# Patient Record
Sex: Female | Born: 1945
Health system: Southern US, Community
[De-identification: ages and names within clinical notes are randomized; demographics above are authoritative.]

## PROBLEM LIST (undated history)

## (undated) ENCOUNTER — Emergency Department (HOSPITAL_COMMUNITY): Discharge status: Absent without leave | Payer: No Typology Code available for payment source

## (undated) DIAGNOSIS — I639 Cerebral infarction, unspecified: Secondary | ICD-10-CM

## (undated) DIAGNOSIS — I10 Essential (primary) hypertension: Secondary | ICD-10-CM

## (undated) DIAGNOSIS — E119 Type 2 diabetes mellitus without complications: Secondary | ICD-10-CM

## (undated) DIAGNOSIS — R569 Unspecified convulsions: Secondary | ICD-10-CM

## (undated) DIAGNOSIS — I4891 Unspecified atrial fibrillation: Secondary | ICD-10-CM

## (undated) DIAGNOSIS — E7800 Pure hypercholesterolemia, unspecified: Secondary | ICD-10-CM

## (undated) HISTORY — DX: Unspecified atrial fibrillation: I48.91

## (undated) HISTORY — DX: Cerebral infarction, unspecified: I63.9

## (undated) HISTORY — PX: TUBAL LIGATION: SHX77

## (undated) HISTORY — PX: ABDOMINAL HYSTERECTOMY: SHX81

---

## 2000-12-16 ENCOUNTER — Encounter: Payer: Self-pay | Admitting: Specialist

## 2000-12-16 ENCOUNTER — Ambulatory Visit (HOSPITAL_COMMUNITY): Admission: RE | Admit: 2000-12-16 | Discharge: 2000-12-16 | Payer: Self-pay | Admitting: *Deleted

## 2001-05-10 ENCOUNTER — Encounter: Payer: Self-pay | Admitting: Family Medicine

## 2001-05-10 ENCOUNTER — Ambulatory Visit (HOSPITAL_COMMUNITY): Admission: RE | Admit: 2001-05-10 | Discharge: 2001-05-10 | Payer: Self-pay | Admitting: Family Medicine

## 2001-12-22 ENCOUNTER — Encounter: Payer: Self-pay | Admitting: Specialist

## 2001-12-22 ENCOUNTER — Ambulatory Visit (HOSPITAL_COMMUNITY): Admission: RE | Admit: 2001-12-22 | Discharge: 2001-12-22 | Payer: Self-pay | Admitting: Specialist

## 2003-02-15 ENCOUNTER — Ambulatory Visit (HOSPITAL_COMMUNITY): Admission: RE | Admit: 2003-02-15 | Discharge: 2003-02-15 | Payer: Self-pay | Admitting: Specialist

## 2008-02-18 ENCOUNTER — Ambulatory Visit (HOSPITAL_COMMUNITY): Admission: RE | Admit: 2008-02-18 | Discharge: 2008-02-18 | Payer: Self-pay | Admitting: Internal Medicine

## 2010-06-17 ENCOUNTER — Other Ambulatory Visit (HOSPITAL_COMMUNITY): Payer: Self-pay | Admitting: Internal Medicine

## 2010-06-17 DIAGNOSIS — Z139 Encounter for screening, unspecified: Secondary | ICD-10-CM

## 2010-06-18 ENCOUNTER — Ambulatory Visit (HOSPITAL_COMMUNITY)
Admission: RE | Admit: 2010-06-18 | Discharge: 2010-06-18 | Disposition: A | Discharge status: Home / Self Care | Payer: 59 | Source: Ambulatory Visit | Attending: Internal Medicine | Admitting: Internal Medicine

## 2010-06-18 DIAGNOSIS — Z1231 Encounter for screening mammogram for malignant neoplasm of breast: Secondary | ICD-10-CM | POA: Insufficient documentation

## 2010-06-18 DIAGNOSIS — Z139 Encounter for screening, unspecified: Secondary | ICD-10-CM

## 2013-02-22 ENCOUNTER — Other Ambulatory Visit (HOSPITAL_COMMUNITY): Payer: Self-pay | Admitting: Internal Medicine

## 2013-02-22 DIAGNOSIS — Z139 Encounter for screening, unspecified: Secondary | ICD-10-CM

## 2013-02-28 ENCOUNTER — Ambulatory Visit (HOSPITAL_COMMUNITY)
Admission: RE | Admit: 2013-02-28 | Discharge: 2013-02-28 | Disposition: A | Discharge status: Home / Self Care | Payer: 59 | Source: Ambulatory Visit | Attending: Internal Medicine | Admitting: Internal Medicine

## 2013-02-28 DIAGNOSIS — Z780 Asymptomatic menopausal state: Secondary | ICD-10-CM | POA: Insufficient documentation

## 2013-02-28 DIAGNOSIS — Z139 Encounter for screening, unspecified: Secondary | ICD-10-CM

## 2013-02-28 DIAGNOSIS — Z13820 Encounter for screening for osteoporosis: Secondary | ICD-10-CM | POA: Insufficient documentation

## 2013-05-24 DIAGNOSIS — R7309 Other abnormal glucose: Secondary | ICD-10-CM | POA: Diagnosis not present

## 2013-05-24 DIAGNOSIS — I10 Essential (primary) hypertension: Secondary | ICD-10-CM | POA: Diagnosis not present

## 2013-05-26 DIAGNOSIS — I10 Essential (primary) hypertension: Secondary | ICD-10-CM | POA: Diagnosis not present

## 2013-05-26 DIAGNOSIS — R7309 Other abnormal glucose: Secondary | ICD-10-CM | POA: Diagnosis not present

## 2013-05-26 DIAGNOSIS — J309 Allergic rhinitis, unspecified: Secondary | ICD-10-CM | POA: Diagnosis not present

## 2013-05-26 DIAGNOSIS — Z0000 Encounter for general adult medical examination without abnormal findings: Secondary | ICD-10-CM | POA: Diagnosis not present

## 2013-06-27 ENCOUNTER — Other Ambulatory Visit (HOSPITAL_COMMUNITY): Payer: Self-pay | Admitting: Internal Medicine

## 2013-06-27 DIAGNOSIS — Z1231 Encounter for screening mammogram for malignant neoplasm of breast: Secondary | ICD-10-CM

## 2013-06-30 ENCOUNTER — Ambulatory Visit (HOSPITAL_COMMUNITY)
Admission: RE | Admit: 2013-06-30 | Discharge: 2013-06-30 | Disposition: A | Discharge status: Home / Self Care | Payer: Medicare Other | Source: Ambulatory Visit | Attending: Internal Medicine | Admitting: Internal Medicine

## 2013-06-30 DIAGNOSIS — Z1231 Encounter for screening mammogram for malignant neoplasm of breast: Secondary | ICD-10-CM | POA: Diagnosis not present

## 2013-11-25 DIAGNOSIS — I10 Essential (primary) hypertension: Secondary | ICD-10-CM | POA: Diagnosis not present

## 2013-11-25 DIAGNOSIS — R7309 Other abnormal glucose: Secondary | ICD-10-CM | POA: Diagnosis not present

## 2013-11-29 DIAGNOSIS — I10 Essential (primary) hypertension: Secondary | ICD-10-CM | POA: Diagnosis not present

## 2013-11-29 DIAGNOSIS — R635 Abnormal weight gain: Secondary | ICD-10-CM | POA: Diagnosis not present

## 2013-11-29 DIAGNOSIS — Z23 Encounter for immunization: Secondary | ICD-10-CM | POA: Diagnosis not present

## 2013-11-29 DIAGNOSIS — R7309 Other abnormal glucose: Secondary | ICD-10-CM | POA: Diagnosis not present

## 2014-05-16 DIAGNOSIS — R7301 Impaired fasting glucose: Secondary | ICD-10-CM | POA: Diagnosis not present

## 2014-05-16 DIAGNOSIS — I10 Essential (primary) hypertension: Secondary | ICD-10-CM | POA: Diagnosis not present

## 2014-05-18 DIAGNOSIS — R7301 Impaired fasting glucose: Secondary | ICD-10-CM | POA: Diagnosis not present

## 2014-05-18 DIAGNOSIS — E782 Mixed hyperlipidemia: Secondary | ICD-10-CM | POA: Diagnosis not present

## 2014-05-18 DIAGNOSIS — I10 Essential (primary) hypertension: Secondary | ICD-10-CM | POA: Diagnosis not present

## 2014-05-18 DIAGNOSIS — Z6825 Body mass index (BMI) 25.0-25.9, adult: Secondary | ICD-10-CM | POA: Diagnosis not present

## 2014-06-02 ENCOUNTER — Other Ambulatory Visit (HOSPITAL_COMMUNITY): Payer: Self-pay | Admitting: Internal Medicine

## 2014-06-02 DIAGNOSIS — Z1231 Encounter for screening mammogram for malignant neoplasm of breast: Secondary | ICD-10-CM

## 2014-07-05 ENCOUNTER — Ambulatory Visit (HOSPITAL_COMMUNITY)
Admission: RE | Admit: 2014-07-05 | Discharge: 2014-07-05 | Disposition: A | Discharge status: Home / Self Care | Payer: Medicare Other | Source: Ambulatory Visit | Attending: Internal Medicine | Admitting: Internal Medicine

## 2014-07-05 DIAGNOSIS — Z1231 Encounter for screening mammogram for malignant neoplasm of breast: Secondary | ICD-10-CM | POA: Insufficient documentation

## 2014-11-22 DIAGNOSIS — R7301 Impaired fasting glucose: Secondary | ICD-10-CM | POA: Diagnosis not present

## 2014-11-22 DIAGNOSIS — I10 Essential (primary) hypertension: Secondary | ICD-10-CM | POA: Diagnosis not present

## 2014-11-22 DIAGNOSIS — E782 Mixed hyperlipidemia: Secondary | ICD-10-CM | POA: Diagnosis not present

## 2014-11-29 DIAGNOSIS — Z23 Encounter for immunization: Secondary | ICD-10-CM | POA: Diagnosis not present

## 2014-11-29 DIAGNOSIS — I10 Essential (primary) hypertension: Secondary | ICD-10-CM | POA: Diagnosis not present

## 2015-05-25 DIAGNOSIS — E782 Mixed hyperlipidemia: Secondary | ICD-10-CM | POA: Diagnosis not present

## 2015-05-25 DIAGNOSIS — I10 Essential (primary) hypertension: Secondary | ICD-10-CM | POA: Diagnosis not present

## 2015-05-25 DIAGNOSIS — R7301 Impaired fasting glucose: Secondary | ICD-10-CM | POA: Diagnosis not present

## 2015-05-29 DIAGNOSIS — I10 Essential (primary) hypertension: Secondary | ICD-10-CM | POA: Diagnosis not present

## 2015-05-29 DIAGNOSIS — E782 Mixed hyperlipidemia: Secondary | ICD-10-CM | POA: Diagnosis not present

## 2015-05-29 DIAGNOSIS — R7301 Impaired fasting glucose: Secondary | ICD-10-CM | POA: Diagnosis not present

## 2015-07-18 ENCOUNTER — Other Ambulatory Visit (HOSPITAL_COMMUNITY): Payer: Self-pay | Admitting: Internal Medicine

## 2015-07-18 DIAGNOSIS — Z1231 Encounter for screening mammogram for malignant neoplasm of breast: Secondary | ICD-10-CM

## 2015-07-20 ENCOUNTER — Ambulatory Visit (HOSPITAL_COMMUNITY)
Admission: RE | Admit: 2015-07-20 | Discharge: 2015-07-20 | Disposition: A | Discharge status: Home / Self Care | Payer: Medicare Other | Source: Ambulatory Visit | Attending: Internal Medicine | Admitting: Internal Medicine

## 2015-07-20 DIAGNOSIS — Z1231 Encounter for screening mammogram for malignant neoplasm of breast: Secondary | ICD-10-CM | POA: Insufficient documentation

## 2015-09-17 DIAGNOSIS — R7301 Impaired fasting glucose: Secondary | ICD-10-CM | POA: Diagnosis not present

## 2015-09-17 DIAGNOSIS — E782 Mixed hyperlipidemia: Secondary | ICD-10-CM | POA: Diagnosis not present

## 2015-09-28 DIAGNOSIS — E782 Mixed hyperlipidemia: Secondary | ICD-10-CM | POA: Diagnosis not present

## 2015-09-28 DIAGNOSIS — I10 Essential (primary) hypertension: Secondary | ICD-10-CM | POA: Diagnosis not present

## 2015-09-28 DIAGNOSIS — E119 Type 2 diabetes mellitus without complications: Secondary | ICD-10-CM | POA: Diagnosis not present

## 2015-11-16 ENCOUNTER — Other Ambulatory Visit: Payer: Self-pay

## 2015-12-20 DIAGNOSIS — Z23 Encounter for immunization: Secondary | ICD-10-CM | POA: Diagnosis not present

## 2016-03-13 DIAGNOSIS — M25511 Pain in right shoulder: Secondary | ICD-10-CM | POA: Diagnosis not present

## 2016-03-26 DIAGNOSIS — M25511 Pain in right shoulder: Secondary | ICD-10-CM | POA: Diagnosis not present

## 2016-03-27 DIAGNOSIS — M6281 Muscle weakness (generalized): Secondary | ICD-10-CM | POA: Diagnosis not present

## 2016-03-27 DIAGNOSIS — M25611 Stiffness of right shoulder, not elsewhere classified: Secondary | ICD-10-CM | POA: Diagnosis not present

## 2016-03-27 DIAGNOSIS — M25511 Pain in right shoulder: Secondary | ICD-10-CM | POA: Diagnosis not present

## 2016-03-27 DIAGNOSIS — M7551 Bursitis of right shoulder: Secondary | ICD-10-CM | POA: Diagnosis not present

## 2016-03-31 DIAGNOSIS — M25611 Stiffness of right shoulder, not elsewhere classified: Secondary | ICD-10-CM | POA: Diagnosis not present

## 2016-03-31 DIAGNOSIS — M7551 Bursitis of right shoulder: Secondary | ICD-10-CM | POA: Diagnosis not present

## 2016-03-31 DIAGNOSIS — M25511 Pain in right shoulder: Secondary | ICD-10-CM | POA: Diagnosis not present

## 2016-03-31 DIAGNOSIS — M6281 Muscle weakness (generalized): Secondary | ICD-10-CM | POA: Diagnosis not present

## 2016-04-07 DIAGNOSIS — M25611 Stiffness of right shoulder, not elsewhere classified: Secondary | ICD-10-CM | POA: Diagnosis not present

## 2016-04-07 DIAGNOSIS — M25511 Pain in right shoulder: Secondary | ICD-10-CM | POA: Diagnosis not present

## 2016-04-07 DIAGNOSIS — M6281 Muscle weakness (generalized): Secondary | ICD-10-CM | POA: Diagnosis not present

## 2016-04-07 DIAGNOSIS — M7551 Bursitis of right shoulder: Secondary | ICD-10-CM | POA: Diagnosis not present

## 2016-04-09 DIAGNOSIS — M6281 Muscle weakness (generalized): Secondary | ICD-10-CM | POA: Diagnosis not present

## 2016-04-09 DIAGNOSIS — M25511 Pain in right shoulder: Secondary | ICD-10-CM | POA: Diagnosis not present

## 2016-04-09 DIAGNOSIS — M7551 Bursitis of right shoulder: Secondary | ICD-10-CM | POA: Diagnosis not present

## 2016-04-09 DIAGNOSIS — M25611 Stiffness of right shoulder, not elsewhere classified: Secondary | ICD-10-CM | POA: Diagnosis not present

## 2016-04-14 DIAGNOSIS — M25511 Pain in right shoulder: Secondary | ICD-10-CM | POA: Diagnosis not present

## 2016-04-14 DIAGNOSIS — M7551 Bursitis of right shoulder: Secondary | ICD-10-CM | POA: Diagnosis not present

## 2016-04-14 DIAGNOSIS — M25611 Stiffness of right shoulder, not elsewhere classified: Secondary | ICD-10-CM | POA: Diagnosis not present

## 2016-04-14 DIAGNOSIS — M6281 Muscle weakness (generalized): Secondary | ICD-10-CM | POA: Diagnosis not present

## 2016-04-16 DIAGNOSIS — M25611 Stiffness of right shoulder, not elsewhere classified: Secondary | ICD-10-CM | POA: Diagnosis not present

## 2016-04-16 DIAGNOSIS — M6281 Muscle weakness (generalized): Secondary | ICD-10-CM | POA: Diagnosis not present

## 2016-04-16 DIAGNOSIS — M25511 Pain in right shoulder: Secondary | ICD-10-CM | POA: Diagnosis not present

## 2016-04-16 DIAGNOSIS — M7551 Bursitis of right shoulder: Secondary | ICD-10-CM | POA: Diagnosis not present

## 2016-04-21 DIAGNOSIS — M25611 Stiffness of right shoulder, not elsewhere classified: Secondary | ICD-10-CM | POA: Diagnosis not present

## 2016-04-21 DIAGNOSIS — M6281 Muscle weakness (generalized): Secondary | ICD-10-CM | POA: Diagnosis not present

## 2016-04-21 DIAGNOSIS — M25511 Pain in right shoulder: Secondary | ICD-10-CM | POA: Diagnosis not present

## 2016-04-21 DIAGNOSIS — M7551 Bursitis of right shoulder: Secondary | ICD-10-CM | POA: Diagnosis not present

## 2016-04-23 DIAGNOSIS — M25511 Pain in right shoulder: Secondary | ICD-10-CM | POA: Diagnosis not present

## 2016-05-14 DIAGNOSIS — E119 Type 2 diabetes mellitus without complications: Secondary | ICD-10-CM | POA: Diagnosis not present

## 2016-05-14 DIAGNOSIS — E782 Mixed hyperlipidemia: Secondary | ICD-10-CM | POA: Diagnosis not present

## 2016-05-16 DIAGNOSIS — Z6826 Body mass index (BMI) 26.0-26.9, adult: Secondary | ICD-10-CM | POA: Diagnosis not present

## 2016-05-16 DIAGNOSIS — I10 Essential (primary) hypertension: Secondary | ICD-10-CM | POA: Diagnosis not present

## 2016-05-16 DIAGNOSIS — E119 Type 2 diabetes mellitus without complications: Secondary | ICD-10-CM | POA: Diagnosis not present

## 2016-05-16 DIAGNOSIS — E782 Mixed hyperlipidemia: Secondary | ICD-10-CM | POA: Diagnosis not present

## 2016-06-18 ENCOUNTER — Other Ambulatory Visit (HOSPITAL_COMMUNITY): Payer: Self-pay | Admitting: Internal Medicine

## 2016-06-18 DIAGNOSIS — Z1231 Encounter for screening mammogram for malignant neoplasm of breast: Secondary | ICD-10-CM

## 2016-07-21 ENCOUNTER — Ambulatory Visit (HOSPITAL_COMMUNITY)
Admission: RE | Admit: 2016-07-21 | Discharge: 2016-07-21 | Disposition: A | Discharge status: Home / Self Care | Payer: Medicare Other | Source: Ambulatory Visit | Attending: Internal Medicine | Admitting: Internal Medicine

## 2016-07-21 DIAGNOSIS — Z1231 Encounter for screening mammogram for malignant neoplasm of breast: Secondary | ICD-10-CM | POA: Diagnosis not present

## 2016-11-27 DIAGNOSIS — I10 Essential (primary) hypertension: Secondary | ICD-10-CM | POA: Diagnosis not present

## 2016-11-27 DIAGNOSIS — E119 Type 2 diabetes mellitus without complications: Secondary | ICD-10-CM | POA: Diagnosis not present

## 2016-12-01 DIAGNOSIS — N952 Postmenopausal atrophic vaginitis: Secondary | ICD-10-CM | POA: Diagnosis not present

## 2016-12-01 DIAGNOSIS — R42 Dizziness and giddiness: Secondary | ICD-10-CM | POA: Diagnosis not present

## 2016-12-01 DIAGNOSIS — I10 Essential (primary) hypertension: Secondary | ICD-10-CM | POA: Diagnosis not present

## 2016-12-01 DIAGNOSIS — E782 Mixed hyperlipidemia: Secondary | ICD-10-CM | POA: Diagnosis not present

## 2016-12-01 DIAGNOSIS — Z23 Encounter for immunization: Secondary | ICD-10-CM | POA: Diagnosis not present

## 2016-12-01 DIAGNOSIS — E119 Type 2 diabetes mellitus without complications: Secondary | ICD-10-CM | POA: Diagnosis not present

## 2016-12-01 DIAGNOSIS — R197 Diarrhea, unspecified: Secondary | ICD-10-CM | POA: Diagnosis not present

## 2016-12-01 DIAGNOSIS — Z6826 Body mass index (BMI) 26.0-26.9, adult: Secondary | ICD-10-CM | POA: Diagnosis not present

## 2017-05-21 DIAGNOSIS — I10 Essential (primary) hypertension: Secondary | ICD-10-CM | POA: Diagnosis not present

## 2017-05-21 DIAGNOSIS — E119 Type 2 diabetes mellitus without complications: Secondary | ICD-10-CM | POA: Diagnosis not present

## 2017-05-21 DIAGNOSIS — E782 Mixed hyperlipidemia: Secondary | ICD-10-CM | POA: Diagnosis not present

## 2017-05-25 DIAGNOSIS — Z6827 Body mass index (BMI) 27.0-27.9, adult: Secondary | ICD-10-CM | POA: Diagnosis not present

## 2017-05-25 DIAGNOSIS — R21 Rash and other nonspecific skin eruption: Secondary | ICD-10-CM | POA: Diagnosis not present

## 2017-05-25 DIAGNOSIS — E119 Type 2 diabetes mellitus without complications: Secondary | ICD-10-CM | POA: Diagnosis not present

## 2017-05-25 DIAGNOSIS — I10 Essential (primary) hypertension: Secondary | ICD-10-CM | POA: Diagnosis not present

## 2017-05-25 DIAGNOSIS — R944 Abnormal results of kidney function studies: Secondary | ICD-10-CM | POA: Diagnosis not present

## 2017-06-18 ENCOUNTER — Other Ambulatory Visit (HOSPITAL_COMMUNITY): Payer: Self-pay | Admitting: Internal Medicine

## 2017-06-18 DIAGNOSIS — Z1231 Encounter for screening mammogram for malignant neoplasm of breast: Secondary | ICD-10-CM

## 2017-07-23 ENCOUNTER — Ambulatory Visit (HOSPITAL_COMMUNITY)
Admission: RE | Admit: 2017-07-23 | Discharge: 2017-07-23 | Disposition: A | Discharge status: Home / Self Care | Payer: Medicare Other | Source: Ambulatory Visit | Attending: Internal Medicine | Admitting: Internal Medicine

## 2017-07-23 DIAGNOSIS — Z1231 Encounter for screening mammogram for malignant neoplasm of breast: Secondary | ICD-10-CM | POA: Insufficient documentation

## 2017-10-16 ENCOUNTER — Other Ambulatory Visit: Payer: Self-pay

## 2017-11-20 DIAGNOSIS — Z6827 Body mass index (BMI) 27.0-27.9, adult: Secondary | ICD-10-CM | POA: Diagnosis not present

## 2017-11-20 DIAGNOSIS — I10 Essential (primary) hypertension: Secondary | ICD-10-CM | POA: Diagnosis not present

## 2017-11-20 DIAGNOSIS — R21 Rash and other nonspecific skin eruption: Secondary | ICD-10-CM | POA: Diagnosis not present

## 2017-11-20 DIAGNOSIS — E119 Type 2 diabetes mellitus without complications: Secondary | ICD-10-CM | POA: Diagnosis not present

## 2017-11-20 DIAGNOSIS — E782 Mixed hyperlipidemia: Secondary | ICD-10-CM | POA: Diagnosis not present

## 2017-11-20 DIAGNOSIS — R944 Abnormal results of kidney function studies: Secondary | ICD-10-CM | POA: Diagnosis not present

## 2017-11-26 DIAGNOSIS — R944 Abnormal results of kidney function studies: Secondary | ICD-10-CM | POA: Diagnosis not present

## 2017-11-26 DIAGNOSIS — Z6827 Body mass index (BMI) 27.0-27.9, adult: Secondary | ICD-10-CM | POA: Diagnosis not present

## 2017-11-26 DIAGNOSIS — R21 Rash and other nonspecific skin eruption: Secondary | ICD-10-CM | POA: Diagnosis not present

## 2017-11-26 DIAGNOSIS — Z23 Encounter for immunization: Secondary | ICD-10-CM | POA: Diagnosis not present

## 2017-11-26 DIAGNOSIS — E782 Mixed hyperlipidemia: Secondary | ICD-10-CM | POA: Diagnosis not present

## 2017-11-26 DIAGNOSIS — B360 Pityriasis versicolor: Secondary | ICD-10-CM | POA: Diagnosis not present

## 2017-11-26 DIAGNOSIS — I10 Essential (primary) hypertension: Secondary | ICD-10-CM | POA: Diagnosis not present

## 2017-11-26 DIAGNOSIS — E559 Vitamin D deficiency, unspecified: Secondary | ICD-10-CM | POA: Diagnosis not present

## 2017-11-26 DIAGNOSIS — Z0000 Encounter for general adult medical examination without abnormal findings: Secondary | ICD-10-CM | POA: Diagnosis not present

## 2017-11-26 DIAGNOSIS — Z6826 Body mass index (BMI) 26.0-26.9, adult: Secondary | ICD-10-CM | POA: Diagnosis not present

## 2017-11-26 DIAGNOSIS — E119 Type 2 diabetes mellitus without complications: Secondary | ICD-10-CM | POA: Diagnosis not present

## 2017-11-26 DIAGNOSIS — R202 Paresthesia of skin: Secondary | ICD-10-CM | POA: Diagnosis not present

## 2017-11-26 DIAGNOSIS — R5383 Other fatigue: Secondary | ICD-10-CM | POA: Diagnosis not present

## 2017-12-03 DIAGNOSIS — Z6826 Body mass index (BMI) 26.0-26.9, adult: Secondary | ICD-10-CM | POA: Diagnosis not present

## 2017-12-03 DIAGNOSIS — Z0000 Encounter for general adult medical examination without abnormal findings: Secondary | ICD-10-CM | POA: Diagnosis not present

## 2018-03-11 DIAGNOSIS — E782 Mixed hyperlipidemia: Secondary | ICD-10-CM | POA: Diagnosis not present

## 2018-03-11 DIAGNOSIS — I10 Essential (primary) hypertension: Secondary | ICD-10-CM | POA: Diagnosis not present

## 2018-03-11 DIAGNOSIS — R944 Abnormal results of kidney function studies: Secondary | ICD-10-CM | POA: Diagnosis not present

## 2018-03-11 DIAGNOSIS — E119 Type 2 diabetes mellitus without complications: Secondary | ICD-10-CM | POA: Diagnosis not present

## 2018-03-19 DIAGNOSIS — B360 Pityriasis versicolor: Secondary | ICD-10-CM | POA: Diagnosis not present

## 2018-03-19 DIAGNOSIS — E1165 Type 2 diabetes mellitus with hyperglycemia: Secondary | ICD-10-CM | POA: Diagnosis not present

## 2018-03-19 DIAGNOSIS — I10 Essential (primary) hypertension: Secondary | ICD-10-CM | POA: Diagnosis not present

## 2018-03-19 DIAGNOSIS — E782 Mixed hyperlipidemia: Secondary | ICD-10-CM | POA: Diagnosis not present

## 2018-06-16 DIAGNOSIS — E782 Mixed hyperlipidemia: Secondary | ICD-10-CM | POA: Diagnosis not present

## 2018-06-16 DIAGNOSIS — I10 Essential (primary) hypertension: Secondary | ICD-10-CM | POA: Diagnosis not present

## 2018-06-16 DIAGNOSIS — E1165 Type 2 diabetes mellitus with hyperglycemia: Secondary | ICD-10-CM | POA: Diagnosis not present

## 2018-06-24 DIAGNOSIS — I10 Essential (primary) hypertension: Secondary | ICD-10-CM | POA: Diagnosis not present

## 2018-06-24 DIAGNOSIS — B360 Pityriasis versicolor: Secondary | ICD-10-CM | POA: Diagnosis not present

## 2018-06-24 DIAGNOSIS — E782 Mixed hyperlipidemia: Secondary | ICD-10-CM | POA: Diagnosis not present

## 2018-06-24 DIAGNOSIS — E1165 Type 2 diabetes mellitus with hyperglycemia: Secondary | ICD-10-CM | POA: Diagnosis not present

## 2018-08-31 ENCOUNTER — Other Ambulatory Visit (HOSPITAL_COMMUNITY): Payer: Self-pay | Admitting: Internal Medicine

## 2018-08-31 DIAGNOSIS — Z1231 Encounter for screening mammogram for malignant neoplasm of breast: Secondary | ICD-10-CM

## 2018-09-13 ENCOUNTER — Ambulatory Visit (HOSPITAL_COMMUNITY)
Admission: RE | Admit: 2018-09-13 | Discharge: 2018-09-13 | Disposition: A | Discharge status: Home / Self Care | Payer: Medicare Other | Source: Ambulatory Visit | Attending: Internal Medicine | Admitting: Internal Medicine

## 2018-09-13 ENCOUNTER — Other Ambulatory Visit: Payer: Self-pay

## 2018-09-13 DIAGNOSIS — Z1231 Encounter for screening mammogram for malignant neoplasm of breast: Secondary | ICD-10-CM | POA: Diagnosis not present

## 2018-09-20 ENCOUNTER — Other Ambulatory Visit: Payer: Self-pay

## 2018-09-20 ENCOUNTER — Inpatient Hospital Stay (HOSPITAL_COMMUNITY): Payer: Medicare Other

## 2018-09-20 ENCOUNTER — Inpatient Hospital Stay (HOSPITAL_COMMUNITY)
Admission: EM | Admit: 2018-09-20 | Discharge: 2018-09-27 | DRG: 064 | Disposition: A | Discharge status: Rehabilitation Facility | Payer: Medicare Other | Attending: Internal Medicine | Admitting: Internal Medicine

## 2018-09-20 ENCOUNTER — Emergency Department (HOSPITAL_COMMUNITY): Payer: Medicare Other

## 2018-09-20 ENCOUNTER — Encounter (HOSPITAL_COMMUNITY): Payer: Self-pay | Admitting: Emergency Medicine

## 2018-09-20 DIAGNOSIS — R350 Frequency of micturition: Secondary | ICD-10-CM | POA: Diagnosis present

## 2018-09-20 DIAGNOSIS — I69322 Dysarthria following cerebral infarction: Secondary | ICD-10-CM | POA: Diagnosis not present

## 2018-09-20 DIAGNOSIS — G8114 Spastic hemiplegia affecting left nondominant side: Secondary | ICD-10-CM | POA: Diagnosis not present

## 2018-09-20 DIAGNOSIS — R531 Weakness: Secondary | ICD-10-CM

## 2018-09-20 DIAGNOSIS — R41842 Visuospatial deficit: Secondary | ICD-10-CM | POA: Diagnosis present

## 2018-09-20 DIAGNOSIS — E7800 Pure hypercholesterolemia, unspecified: Secondary | ICD-10-CM | POA: Diagnosis present

## 2018-09-20 DIAGNOSIS — E11649 Type 2 diabetes mellitus with hypoglycemia without coma: Secondary | ICD-10-CM | POA: Diagnosis not present

## 2018-09-20 DIAGNOSIS — E870 Hyperosmolality and hypernatremia: Secondary | ICD-10-CM | POA: Diagnosis present

## 2018-09-20 DIAGNOSIS — Z8249 Family history of ischemic heart disease and other diseases of the circulatory system: Secondary | ICD-10-CM | POA: Diagnosis not present

## 2018-09-20 DIAGNOSIS — E1169 Type 2 diabetes mellitus with other specified complication: Secondary | ICD-10-CM | POA: Diagnosis not present

## 2018-09-20 DIAGNOSIS — I639 Cerebral infarction, unspecified: Secondary | ICD-10-CM

## 2018-09-20 DIAGNOSIS — R17 Unspecified jaundice: Secondary | ICD-10-CM | POA: Diagnosis present

## 2018-09-20 DIAGNOSIS — N179 Acute kidney failure, unspecified: Secondary | ICD-10-CM | POA: Diagnosis not present

## 2018-09-20 DIAGNOSIS — L8990 Pressure ulcer of unspecified site, unspecified stage: Secondary | ICD-10-CM | POA: Insufficient documentation

## 2018-09-20 DIAGNOSIS — G8194 Hemiplegia, unspecified affecting left nondominant side: Secondary | ICD-10-CM | POA: Diagnosis present

## 2018-09-20 DIAGNOSIS — Z20828 Contact with and (suspected) exposure to other viral communicable diseases: Secondary | ICD-10-CM | POA: Diagnosis not present

## 2018-09-20 DIAGNOSIS — I10 Essential (primary) hypertension: Secondary | ICD-10-CM | POA: Diagnosis present

## 2018-09-20 DIAGNOSIS — Z1159 Encounter for screening for other viral diseases: Secondary | ICD-10-CM | POA: Diagnosis not present

## 2018-09-20 DIAGNOSIS — Z823 Family history of stroke: Secondary | ICD-10-CM | POA: Diagnosis not present

## 2018-09-20 DIAGNOSIS — Z9851 Tubal ligation status: Secondary | ICD-10-CM

## 2018-09-20 DIAGNOSIS — D72829 Elevated white blood cell count, unspecified: Secondary | ICD-10-CM | POA: Diagnosis present

## 2018-09-20 DIAGNOSIS — E669 Obesity, unspecified: Secondary | ICD-10-CM | POA: Diagnosis not present

## 2018-09-20 DIAGNOSIS — Z7984 Long term (current) use of oral hypoglycemic drugs: Secondary | ICD-10-CM | POA: Diagnosis not present

## 2018-09-20 DIAGNOSIS — R29810 Facial weakness: Secondary | ICD-10-CM | POA: Diagnosis not present

## 2018-09-20 DIAGNOSIS — E1165 Type 2 diabetes mellitus with hyperglycemia: Secondary | ICD-10-CM | POA: Diagnosis not present

## 2018-09-20 DIAGNOSIS — G935 Compression of brain: Secondary | ICD-10-CM | POA: Diagnosis present

## 2018-09-20 DIAGNOSIS — Z79899 Other long term (current) drug therapy: Secondary | ICD-10-CM

## 2018-09-20 DIAGNOSIS — L89620 Pressure ulcer of left heel, unstageable: Secondary | ICD-10-CM | POA: Insufficient documentation

## 2018-09-20 DIAGNOSIS — I248 Other forms of acute ischemic heart disease: Secondary | ICD-10-CM | POA: Diagnosis present

## 2018-09-20 DIAGNOSIS — I959 Hypotension, unspecified: Secondary | ICD-10-CM | POA: Diagnosis not present

## 2018-09-20 DIAGNOSIS — R32 Unspecified urinary incontinence: Secondary | ICD-10-CM | POA: Diagnosis not present

## 2018-09-20 DIAGNOSIS — R404 Transient alteration of awareness: Secondary | ICD-10-CM | POA: Diagnosis not present

## 2018-09-20 DIAGNOSIS — I63411 Cerebral infarction due to embolism of right middle cerebral artery: Secondary | ICD-10-CM | POA: Diagnosis present

## 2018-09-20 DIAGNOSIS — R1310 Dysphagia, unspecified: Secondary | ICD-10-CM | POA: Diagnosis present

## 2018-09-20 DIAGNOSIS — I480 Paroxysmal atrial fibrillation: Secondary | ICD-10-CM | POA: Diagnosis not present

## 2018-09-20 DIAGNOSIS — Z7901 Long term (current) use of anticoagulants: Secondary | ICD-10-CM

## 2018-09-20 DIAGNOSIS — E785 Hyperlipidemia, unspecified: Secondary | ICD-10-CM | POA: Diagnosis present

## 2018-09-20 DIAGNOSIS — E860 Dehydration: Secondary | ICD-10-CM | POA: Diagnosis present

## 2018-09-20 DIAGNOSIS — M6282 Rhabdomyolysis: Secondary | ICD-10-CM

## 2018-09-20 DIAGNOSIS — R29723 NIHSS score 23: Secondary | ICD-10-CM | POA: Diagnosis present

## 2018-09-20 DIAGNOSIS — Z87891 Personal history of nicotine dependence: Secondary | ICD-10-CM | POA: Diagnosis not present

## 2018-09-20 DIAGNOSIS — Z8673 Personal history of transient ischemic attack (TIA), and cerebral infarction without residual deficits: Secondary | ICD-10-CM

## 2018-09-20 DIAGNOSIS — R4781 Slurred speech: Secondary | ICD-10-CM | POA: Diagnosis not present

## 2018-09-20 DIAGNOSIS — I69398 Other sequelae of cerebral infarction: Secondary | ICD-10-CM | POA: Diagnosis not present

## 2018-09-20 DIAGNOSIS — T796XXS Traumatic ischemia of muscle, sequela: Secondary | ICD-10-CM | POA: Diagnosis not present

## 2018-09-20 DIAGNOSIS — I4819 Other persistent atrial fibrillation: Secondary | ICD-10-CM | POA: Diagnosis present

## 2018-09-20 DIAGNOSIS — I482 Chronic atrial fibrillation, unspecified: Secondary | ICD-10-CM | POA: Diagnosis not present

## 2018-09-20 DIAGNOSIS — E119 Type 2 diabetes mellitus without complications: Secondary | ICD-10-CM | POA: Diagnosis present

## 2018-09-20 DIAGNOSIS — L89322 Pressure ulcer of left buttock, stage 2: Secondary | ICD-10-CM | POA: Diagnosis present

## 2018-09-20 DIAGNOSIS — Z90710 Acquired absence of both cervix and uterus: Secondary | ICD-10-CM | POA: Diagnosis not present

## 2018-09-20 DIAGNOSIS — R414 Neurologic neglect syndrome: Secondary | ICD-10-CM | POA: Diagnosis present

## 2018-09-20 DIAGNOSIS — I4891 Unspecified atrial fibrillation: Secondary | ICD-10-CM

## 2018-09-20 DIAGNOSIS — L89324 Pressure ulcer of left buttock, stage 4: Secondary | ICD-10-CM | POA: Diagnosis not present

## 2018-09-20 DIAGNOSIS — R269 Unspecified abnormalities of gait and mobility: Secondary | ICD-10-CM | POA: Diagnosis not present

## 2018-09-20 DIAGNOSIS — I63511 Cerebral infarction due to unspecified occlusion or stenosis of right middle cerebral artery: Secondary | ICD-10-CM | POA: Diagnosis not present

## 2018-09-20 DIAGNOSIS — I69354 Hemiplegia and hemiparesis following cerebral infarction affecting left non-dominant side: Secondary | ICD-10-CM | POA: Diagnosis present

## 2018-09-20 DIAGNOSIS — R410 Disorientation, unspecified: Secondary | ICD-10-CM | POA: Diagnosis not present

## 2018-09-20 DIAGNOSIS — K5900 Constipation, unspecified: Secondary | ICD-10-CM | POA: Diagnosis present

## 2018-09-20 DIAGNOSIS — M25512 Pain in left shoulder: Secondary | ICD-10-CM | POA: Diagnosis not present

## 2018-09-20 DIAGNOSIS — I471 Supraventricular tachycardia: Secondary | ICD-10-CM | POA: Diagnosis not present

## 2018-09-20 DIAGNOSIS — G8110 Spastic hemiplegia affecting unspecified side: Secondary | ICD-10-CM | POA: Diagnosis not present

## 2018-09-20 DIAGNOSIS — R7309 Other abnormal glucose: Secondary | ICD-10-CM | POA: Diagnosis not present

## 2018-09-20 DIAGNOSIS — I69312 Visuospatial deficit and spatial neglect following cerebral infarction: Secondary | ICD-10-CM | POA: Diagnosis not present

## 2018-09-20 DIAGNOSIS — I6930 Unspecified sequelae of cerebral infarction: Secondary | ICD-10-CM | POA: Diagnosis present

## 2018-09-20 DIAGNOSIS — I69391 Dysphagia following cerebral infarction: Secondary | ICD-10-CM | POA: Diagnosis not present

## 2018-09-20 DIAGNOSIS — R209 Unspecified disturbances of skin sensation: Secondary | ICD-10-CM | POA: Diagnosis not present

## 2018-09-20 HISTORY — DX: Essential (primary) hypertension: I10

## 2018-09-20 HISTORY — DX: Cerebral infarction, unspecified: I63.9

## 2018-09-20 HISTORY — DX: Pure hypercholesterolemia, unspecified: E78.00

## 2018-09-20 HISTORY — DX: Type 2 diabetes mellitus without complications: E11.9

## 2018-09-20 LAB — CK: Total CK: 2651 U/L — ABNORMAL HIGH (ref 38–234)

## 2018-09-20 LAB — DIFFERENTIAL
Abs Immature Granulocytes: 0.05 10*3/uL (ref 0.00–0.07)
Basophils Absolute: 0 10*3/uL (ref 0.0–0.1)
Basophils Relative: 0 %
Eosinophils Absolute: 0.2 10*3/uL (ref 0.0–0.5)
Eosinophils Relative: 1 %
Immature Granulocytes: 0 %
Lymphocytes Relative: 8 %
Lymphs Abs: 1.2 10*3/uL (ref 0.7–4.0)
Monocytes Absolute: 1.2 10*3/uL — ABNORMAL HIGH (ref 0.1–1.0)
Monocytes Relative: 8 %
Neutro Abs: 11.5 10*3/uL — ABNORMAL HIGH (ref 1.7–7.7)
Neutrophils Relative %: 83 %

## 2018-09-20 LAB — COMPREHENSIVE METABOLIC PANEL
ALT: 30 U/L (ref 0–44)
AST: 63 U/L — ABNORMAL HIGH (ref 15–41)
Albumin: 4.2 g/dL (ref 3.5–5.0)
Alkaline Phosphatase: 61 U/L (ref 38–126)
Anion gap: 16 — ABNORMAL HIGH (ref 5–15)
BUN: 31 mg/dL — ABNORMAL HIGH (ref 8–23)
CO2: 23 mmol/L (ref 22–32)
Calcium: 10.4 mg/dL — ABNORMAL HIGH (ref 8.9–10.3)
Chloride: 107 mmol/L (ref 98–111)
Creatinine, Ser: 1.04 mg/dL — ABNORMAL HIGH (ref 0.44–1.00)
GFR calc Af Amer: >60 mL/min (ref >60)
GFR calc non Af Amer: 54 mL/min — ABNORMAL LOW (ref >60)
Glucose, Bld: 132 mg/dL — ABNORMAL HIGH (ref 70–99)
Potassium: 3.8 mmol/L (ref 3.5–5.1)
Sodium: 146 mmol/L — ABNORMAL HIGH (ref 135–145)
Total Bilirubin: 1.3 mg/dL — ABNORMAL HIGH (ref 0.3–1.2)
Total Protein: 8.5 g/dL — ABNORMAL HIGH (ref 6.5–8.1)

## 2018-09-20 LAB — I-STAT CHEM 8, ED
BUN: 34 mg/dL — ABNORMAL HIGH (ref 8–23)
Calcium, Ion: 1.19 mmol/L (ref 1.15–1.40)
Chloride: 112 mmol/L — ABNORMAL HIGH (ref 98–111)
Creatinine, Ser: 1 mg/dL (ref 0.44–1.00)
Glucose, Bld: 128 mg/dL — ABNORMAL HIGH (ref 70–99)
HCT: 52 % — ABNORMAL HIGH (ref 36.0–46.0)
Hemoglobin: 17.7 g/dL — ABNORMAL HIGH (ref 12.0–15.0)
Potassium: 4.4 mmol/L (ref 3.5–5.1)
Sodium: 149 mmol/L — ABNORMAL HIGH (ref 135–145)
TCO2: 27 mmol/L (ref 22–32)

## 2018-09-20 LAB — CBC
HCT: 49.2 % — ABNORMAL HIGH (ref 36.0–46.0)
Hemoglobin: 15.5 g/dL — ABNORMAL HIGH (ref 12.0–15.0)
MCH: 26.7 pg (ref 26.0–34.0)
MCHC: 31.5 g/dL (ref 30.0–36.0)
MCV: 84.7 fL (ref 80.0–100.0)
Platelets: 207 10*3/uL (ref 150–400)
RBC: 5.81 MIL/uL — ABNORMAL HIGH (ref 3.87–5.11)
RDW: 14.9 % (ref 11.5–15.5)
WBC: 14 10*3/uL — ABNORMAL HIGH (ref 4.0–10.5)
nRBC: 0 % (ref 0.0–0.2)

## 2018-09-20 LAB — RAPID URINE DRUG SCREEN, HOSP PERFORMED
Amphetamines: NOT DETECTED
Barbiturates: NOT DETECTED
Benzodiazepines: NOT DETECTED
Cocaine: NOT DETECTED
Opiates: NOT DETECTED
Tetrahydrocannabinol: NOT DETECTED

## 2018-09-20 LAB — URINALYSIS, ROUTINE W REFLEX MICROSCOPIC
Bacteria, UA: NONE SEEN
Bilirubin Urine: NEGATIVE
Glucose, UA: NEGATIVE mg/dL
Ketones, ur: 20 mg/dL — AB
Nitrite: NEGATIVE
Protein, ur: 100 mg/dL — AB
Specific Gravity, Urine: 1.023 (ref 1.005–1.030)
pH: 5 (ref 5.0–8.0)

## 2018-09-20 LAB — MAGNESIUM: Magnesium: 2.3 mg/dL (ref 1.7–2.4)

## 2018-09-20 LAB — PROTIME-INR
INR: 1.1 (ref 0.8–1.2)
Prothrombin Time: 13.6 seconds (ref 11.4–15.2)

## 2018-09-20 LAB — APTT: aPTT: 24 seconds (ref 24–36)

## 2018-09-20 LAB — TROPONIN I (HIGH SENSITIVITY): Troponin I (High Sensitivity): 19 ng/L — ABNORMAL HIGH (ref <18)

## 2018-09-20 LAB — BRAIN NATRIURETIC PEPTIDE: B Natriuretic Peptide: 485 pg/mL — ABNORMAL HIGH (ref 0.0–100.0)

## 2018-09-20 LAB — PHOSPHORUS: Phosphorus: 2.7 mg/dL (ref 2.5–4.6)

## 2018-09-20 LAB — SARS CORONAVIRUS 2 BY RT PCR (HOSPITAL ORDER, PERFORMED IN ~~LOC~~ HOSPITAL LAB): SARS Coronavirus 2: NEGATIVE

## 2018-09-20 LAB — ETHANOL: Alcohol, Ethyl (B): <10 mg/dL (ref <10)

## 2018-09-20 LAB — GLUCOSE, CAPILLARY: Glucose-Capillary: 104 mg/dL — ABNORMAL HIGH (ref 70–99)

## 2018-09-20 MED ORDER — SODIUM CHLORIDE 0.9 % IV BOLUS
500.0000 mL | Freq: Once | INTRAVENOUS | Status: AC
  Administered 2018-09-20: 500 mL via INTRAVENOUS

## 2018-09-20 MED ORDER — DILTIAZEM LOAD VIA INFUSION
10.0000 mg | Freq: Once | INTRAVENOUS | Status: AC
  Administered 2018-09-20: 10 mg via INTRAVENOUS
  Filled 2018-09-20: qty 10

## 2018-09-20 MED ORDER — METOPROLOL TARTRATE 5 MG/5ML IV SOLN
2.5000 mg | INTRAVENOUS | Status: DC | PRN
  Administered 2018-09-20 – 2018-09-21 (×3): 2.5 mg via INTRAVENOUS
  Filled 2018-09-20 (×5): qty 5

## 2018-09-20 MED ORDER — ACETAMINOPHEN 325 MG PO TABS
650.0000 mg | ORAL_TABLET | ORAL | Status: DC | PRN
  Administered 2018-09-21 – 2018-09-24 (×4): 650 mg via ORAL
  Filled 2018-09-20 (×4): qty 2

## 2018-09-20 MED ORDER — SODIUM CHLORIDE 0.9 % IV SOLN
INTRAVENOUS | Status: DC
  Administered 2018-09-20 (×2): via INTRAVENOUS

## 2018-09-20 MED ORDER — DILTIAZEM HCL 100 MG IV SOLR
5.0000 mg/h | INTRAVENOUS | Status: DC
  Administered 2018-09-20: 5 mg/h via INTRAVENOUS
  Administered 2018-09-20: 17:00:00 15 mg/h via INTRAVENOUS
  Filled 2018-09-20 (×2): qty 100

## 2018-09-20 MED ORDER — ENOXAPARIN SODIUM 40 MG/0.4ML ~~LOC~~ SOLN
40.0000 mg | SUBCUTANEOUS | Status: DC
  Administered 2018-09-20: 40 mg via SUBCUTANEOUS
  Filled 2018-09-20: qty 0.4

## 2018-09-20 MED ORDER — DIGOXIN 0.25 MG/ML IJ SOLN
0.3750 mg | Freq: Once | INTRAMUSCULAR | Status: AC
  Administered 2018-09-20: 16:00:00 0.375 mg via INTRAVENOUS
  Filled 2018-09-20: qty 2

## 2018-09-20 MED ORDER — POTASSIUM CHLORIDE IN NACL 20-0.45 MEQ/L-% IV SOLN
INTRAVENOUS | Status: DC
  Filled 2018-09-20 (×3): qty 1000

## 2018-09-20 MED ORDER — METOPROLOL TARTRATE 5 MG/5ML IV SOLN
5.0000 mg | Freq: Once | INTRAVENOUS | Status: AC
  Administered 2018-09-20: 5 mg via INTRAVENOUS
  Filled 2018-09-20: qty 5

## 2018-09-20 MED ORDER — DIGOXIN 0.25 MG/ML IJ SOLN
0.2500 mg | Freq: Four times a day (QID) | INTRAMUSCULAR | Status: AC
  Administered 2018-09-20 – 2018-09-21 (×2): 0.25 mg via INTRAVENOUS
  Filled 2018-09-20 (×2): qty 1

## 2018-09-20 MED ORDER — ACETAMINOPHEN 650 MG RE SUPP: 650.0000 mg | RECTAL | Status: DC | PRN

## 2018-09-20 MED ORDER — PROCHLORPERAZINE EDISYLATE 10 MG/2ML IJ SOLN: 5.0000 mg | INTRAMUSCULAR | Status: DC | PRN

## 2018-09-20 MED ORDER — POTASSIUM CHLORIDE IN NACL 20-0.45 MEQ/L-% IV SOLN
INTRAVENOUS | Status: DC
  Administered 2018-09-20 (×2): via INTRAVENOUS
  Filled 2018-09-20 (×2): qty 1000

## 2018-09-20 MED ORDER — SODIUM CHLORIDE 0.9 % IV BOLUS
500.0000 mL | Freq: Once | INTRAVENOUS | Status: AC
  Administered 2018-09-20: 13:00:00 500 mL via INTRAVENOUS

## 2018-09-20 MED ORDER — ASPIRIN 325 MG PO TABS
325.0000 mg | ORAL_TABLET | Freq: Every day | ORAL | Status: DC
  Administered 2018-09-22 – 2018-09-23 (×2): 325 mg via ORAL
  Filled 2018-09-20 (×2): qty 1

## 2018-09-20 MED ORDER — POTASSIUM CHLORIDE IN NACL 20-0.9 MEQ/L-% IV SOLN
INTRAVENOUS | Status: DC
  Administered 2018-09-21 – 2018-09-24 (×8): via INTRAVENOUS
  Filled 2018-09-20 (×9): qty 1000

## 2018-09-20 MED ORDER — DILTIAZEM HCL 25 MG/5ML IV SOLN
15.0000 mg | Freq: Once | INTRAVENOUS | Status: AC
  Administered 2018-09-20: 15 mg via INTRAVENOUS
  Filled 2018-09-20: qty 5

## 2018-09-20 MED ORDER — MAGNESIUM SULFATE 2 GM/50ML IV SOLN
2.0000 g | Freq: Once | INTRAVENOUS | Status: AC
  Administered 2018-09-20: 16:00:00 2 g via INTRAVENOUS
  Filled 2018-09-20: qty 50

## 2018-09-20 MED ORDER — ENOXAPARIN SODIUM 40 MG/0.4ML ~~LOC~~ SOLN: 40.0000 mg | SUBCUTANEOUS | Status: DC

## 2018-09-20 MED ORDER — INSULIN ASPART 100 UNIT/ML ~~LOC~~ SOLN
0.0000 [IU] | Freq: Three times a day (TID) | SUBCUTANEOUS | Status: DC
  Administered 2018-09-21 (×2): 1 [IU] via SUBCUTANEOUS
  Administered 2018-09-22: 2 [IU] via SUBCUTANEOUS
  Administered 2018-09-23 – 2018-09-25 (×2): 1 [IU] via SUBCUTANEOUS

## 2018-09-20 MED ORDER — STROKE: EARLY STAGES OF RECOVERY BOOK
Freq: Once | Status: DC
  Filled 2018-09-20 (×2): qty 1

## 2018-09-20 MED ORDER — ACETAMINOPHEN 160 MG/5ML PO SOLN: 650.0000 mg | ORAL | Status: DC | PRN

## 2018-09-20 MED ORDER — ASPIRIN 300 MG RE SUPP
300.0000 mg | Freq: Every day | RECTAL | Status: DC
  Administered 2018-09-20 – 2018-09-21 (×2): 300 mg via RECTAL
  Filled 2018-09-20 (×2): qty 1

## 2018-09-20 MED ORDER — DILTIAZEM HCL 25 MG/5ML IV SOLN
15.0000 mg | Freq: Once | INTRAVENOUS | Status: AC
  Administered 2018-09-20: 15 mg via INTRAVENOUS

## 2018-09-20 NOTE — Progress Notes (Signed)
I have changed echo to routine, afib RVR 160s-170s is not going to allow for any reliable interpretation. She is not fluid overloaded by exam, may hydrate her as neccesary and follow her clinically.     Zandra Abts MD

## 2018-09-20 NOTE — ED Provider Notes (Signed)
[facility] EMERGENCY DEPARTMENT Provider Note   CSN: [id number] Arrival date & time: 09/20/18  1053     History   Chief Complaint Chief Complaint  Patient presents with   Weakness    HPI Rachel Fox is a 73 y.o. female.     HPI  Pt was seen at 1100. Per EMS, pt's family, and pt report: Pt c/o sudden onset and resolution of one episode of fall that occurred 3 days ago. Pt states "everything went black" and she fell on Friday. Pt believes "my sugar dropped." Pt's family states they last saw or heard from her on Friday. Family assumes she has been laying on the floor since Friday. Pt states she "couldn't get up" because of left sided weakness. EMS noted pt had left sided facial droop, LUE and LLE weakness on their arrival to scene. EMS monitor also noted afib/RVR. Pt denies CP/palpitations, no SOB/cough, no abd pain, no N/V/D, no back or neck pain.    Past Medical History:  Diagnosis Date   Diabetes mellitus without complication (HCC)    High cholesterol    Hypertension     There are no active problems to display for this patient.   Past Surgical History:  Procedure Laterality Date   ABDOMINAL HYSTERECTOMY       OB History   No obstetric history on file.      Home Medications    Prior to Admission medications   Not on File    Family History History reviewed. No pertinent family history.  Social History Social History   Tobacco Use   Smoking status: Former Smoker  Substance Use Topics   Alcohol use: Never    Frequency: Never   Drug use: Never     Allergies   Patient has no known allergies.   Review of Systems Review of Systems ROS: Statement: All systems negative except as marked or noted in the HPI; Constitutional: Negative for fever and chills. ; ; Eyes: Negative for eye pain, redness and discharge. ; ; ENMT: Negative for ear pain, hoarseness, nasal congestion, sinus pressure and sore throat. ; ; Cardiovascular: Negative for chest  pain, palpitations, diaphoresis, dyspnea and peripheral edema. ; ; Respiratory: Negative for cough, wheezing and stridor. ; ; Gastrointestinal: Negative for nausea, vomiting, diarrhea, abdominal pain, blood in stool, hematemesis, jaundice and rectal bleeding. . ; ; Genitourinary: Negative for dysuria, flank pain and hematuria. ; ; Musculoskeletal: Negative for back pain and neck pain. Negative for swelling and trauma.; ; Skin: Negative for pruritus, rash, abrasions, blisters, bruising and skin lesion.; ; Neuro: Negative for headache, lightheadedness and neck stiffness. Negative for weakness, altered level of consciousness, altered mental status, extremity weakness, paresthesias, involuntary movement, seizure and syncope.       Physical Exam Updated Vital Signs BP 126/81    Pulse 92    Temp 98.2 F (36.8 C) (Oral)    Resp (!) 21    Ht 5\' 6"  (1.676 m)    Wt 70.8 kg    SpO2 98%    BMI 25.18 kg/m    Patient Vitals for the past 24 hrs:  BP Temp Temp src Pulse Resp SpO2 Height Weight  09/20/18 1430 (!) 134/119 -- -- (!) 140 (!) 22 99 % -- --  09/20/18 1415 -- -- -- (!) 57 20 98 % -- --  09/20/18 1400 (!) 137/95 -- -- 98 18 98 % -- --  09/20/18 1330 116/85 -- -- 96 (!) 28 96 % -- --  09/20/18 1315 -- -- -- (!) 183 19 98 % -- --  09/20/18 1300 119/73 -- -- 62 (!) 23 98 % -- --  09/20/18 1230 126/81 -- -- 92 (!) 21 98 % -- --  09/20/18 1200 (!) 149/111 -- -- (!) 39 (!) 21 100 % -- --  09/20/18 1135 130/71 -- -- (!) 103 20 97 % -- --  09/20/18 1103 (!) 212/171 98.2 F (36.8 C) Oral (!) 187 20 99 % -- --  09/20/18 1100 -- -- -- -- -- --  (1.676 m) 70.8 kg     Physical Exam 1105: Physical examination:  Nursing notes reviewed; Vital signs and O2 SAT reviewed;  Constitutional: Well developed, Well nourished, In no acute distress; Head:  Normocephalic, atraumatic; Eyes: EOMI, PERRL, No scleral icterus; ENMT: Mouth and pharynx normal, Mucous membranes dry; Neck: Supple, Full range of motion, No  lymphadenopathy; Cardiovascular: Tachycardic irregular rate and rhythm, No gallop; Respiratory: Breath sounds clear & equal bilaterally, No wheezes.  Speaking full sentences with ease, Normal respiratory effort/excursion; Chest: Nontender, Movement normal; Abdomen: Soft, Nontender, Nondistended, Normal bowel sounds; Genitourinary: No CVA tenderness; Extremities: Peripheral pulses normal, No tenderness, No edema, No calf edema or asymmetry.; Neuro: AA&Ox3, Major CN grossly intact. Speech slightly slurred. +left facial droop. Grips R>L. Strength 5/5 RUE and RLE, strength 2/5 LUE and LLE with subjective decreased sensation. Normal cerebellar testing RUE (finger-nose) and RLE (heel-shin), unable to perform LUE and LLE..; Skin: Color normal, Warm, Dry.   ED Treatments / Results  Labs (all labs ordered are listed, but only abnormal results are displayed)   EKG EKG Interpretation  Date/Time:  Monday September 20 2018 11:01:28 EDT Ventricular Rate:  189 PR Interval:    QRS Duration: 55 QT Interval:  228 QTC Calculation: 405 R Axis:   64 Text Interpretation:  Atrial fibrillation with rapid V-rate Ventricular premature complex Repolarization abnormality, prob rate related No old tracing to compare Confirmed by Samuel Jester [phone]) on 09/20/2018 12:53:23 PM      Radiology   Procedures Procedures (including critical care time)  Medications Ordered in ED Medications  diltiazem (CARDIZEM) 1 mg/mL load via infusion 10 mg (10 mg Intravenous Bolus from Bag 09/20/18 1129)    And  diltiazem (CARDIZEM) 100 mg in dextrose 5 % 100 mL (1 mg/mL) infusion (10 mg/hr Intravenous Rate/Dose Verify 09/20/18 1231)  0.9 %  sodium chloride infusion ( Intravenous New Bag/Given 09/20/18 1126)  sodium chloride 0.9 % bolus 500 mL (0 mLs Intravenous Stopped 09/20/18 1258)  diltiazem (CARDIZEM) injection 15 mg (15 mg Intravenous Bolus 09/20/18 1249)     Initial Impression / Assessment and Plan / ED Course  I have reviewed  the triage vital signs and the nursing notes.  Pertinent labs & imaging results that were available during my care of the patient were reviewed by me and considered in my medical decision making (see chart for details).     MDM Reviewed: previous chart, nursing note and vitals Reviewed previous: labs and ECG Interpretation: labs, ECG, x-ray and CT scan Total time providing critical care: 30-74 minutes. This excludes time spent performing separately reportable procedures and services. Consults: neurology and admitting MD   CRITICAL CARE Performed by: Samuel Jester Total critical care time: 55 minutes Critical care time was exclusive of separately billable procedures and treating other patients. Critical care was necessary to treat or prevent imminent or life-threatening deterioration. Critical care was time spent personally by me on the following activities: development of treatment  plan with patient and/or surrogate as well as nursing, discussions with consultants, evaluation of patient's response to treatment, examination of patient, obtaining history from patient or surrogate, ordering and performing treatments and interventions, ordering and review of laboratory studies, ordering and review of radiographic studies, pulse oximetry and re-evaluation of patient's condition.   Results for orders placed or performed during the hospital encounter of 09/20/18  SARS Coronavirus 2 (CEPHEID - Performed in [facility] hospital lab), [facility] Order   Specimen: Nasopharyngeal Swab  Result Value Ref Range   SARS Coronavirus 2 NEGATIVE NEGATIVE  Ethanol  Result Value Ref Range   Alcohol, Ethyl (B) <10 <10 mg/dL  Protime-INR  Result Value Ref Range   Prothrombin Time 13.6 11.4 - 15.2 seconds   INR 1.1 0.8 - 1.2  APTT  Result Value Ref Range   aPTT 24 24 - 36 seconds  CBC  Result Value Ref Range   WBC 14.0 (H) 4.0 - 10.5 K/uL   RBC 5.81 (H) 3.87 - 5.11 MIL/uL   Hemoglobin 15.5 (H) 12.0 -  15.0 g/dL   HCT 16.1 (H) 09.6 - 04.5 %   MCV 84.7 80.0 - 100.0 fL   MCH 26.7 26.0 - 34.0 pg   MCHC 31.5 30.0 - 36.0 g/dL   RDW 40.9 81.1 - 91.4 %   Platelets 207 150 - 400 K/uL   nRBC 0.0 0.0 - 0.2 %  Differential  Result Value Ref Range   Neutrophils Relative % 83 %   Neutro Abs 11.5 (H) 1.7 - 7.7 K/uL   Lymphocytes Relative 8 %   Lymphs Abs 1.2 0.7 - 4.0 K/uL   Monocytes Relative 8 %   Monocytes Absolute 1.2 (H) 0.1 - 1.0 K/uL   Eosinophils Relative 1 %   Eosinophils Absolute 0.2 0.0 - 0.5 K/uL   Basophils Relative 0 %   Basophils Absolute 0.0 0.0 - 0.1 K/uL   Immature Granulocytes 0 %   Abs Immature Granulocytes 0.05 0.00 - 0.07 K/uL  Comprehensive metabolic panel  Result Value Ref Range   Sodium 146 (H) 135 - 145 mmol/L   Potassium 3.8 3.5 - 5.1 mmol/L   Chloride 107 98 - 111 mmol/L   CO2 23 22 - 32 mmol/L   Glucose, Bld 132 (H) 70 - 99 mg/dL   BUN 31 (H) 8 - 23 mg/dL   Creatinine, Ser 7.82 (H) 0.44 - 1.00 mg/dL   Calcium 95.6 (H) 8.9 - 10.3 mg/dL   Total Protein 8.5 (H) 6.5 - 8.1 g/dL   Albumin 4.2 3.5 - 5.0 g/dL   AST 63 (H) 15 - 41 U/L   ALT 30 0 - 44 U/L   Alkaline Phosphatase 61 38 - 126 U/L   Total Bilirubin 1.3 (H) 0.3 - 1.2 mg/dL   GFR calc non Af Amer 54 (L) >60 mL/min   GFR calc Af Amer >60 >60 mL/min   Anion gap 16 (H) 5 - 15  Urine rapid drug screen (hosp performed)not at [facility]  Result Value Ref Range   Opiates NONE DETECTED NONE DETECTED   Cocaine NONE DETECTED NONE DETECTED   Benzodiazepines NONE DETECTED NONE DETECTED   Amphetamines NONE DETECTED NONE DETECTED   Tetrahydrocannabinol NONE DETECTED NONE DETECTED   Barbiturates NONE DETECTED NONE DETECTED  Urinalysis, Routine w reflex microscopic  Result Value Ref Range   Color, Urine YELLOW YELLOW   APPearance CLEAR CLEAR   Specific Gravity, Urine 1.023 1.005 - 1.030   pH 5.0 5.0 - 8.0  Glucose, UA NEGATIVE NEGATIVE mg/dL   Hgb urine dipstick MODERATE (A) NEGATIVE   Bilirubin Urine NEGATIVE  NEGATIVE   Ketones, ur 20 (A) NEGATIVE mg/dL   Protein, ur 604100 (A) NEGATIVE mg/dL   Nitrite NEGATIVE NEGATIVE   Leukocytes,Ua TRACE (A) NEGATIVE   RBC / HPF 0-5 0 - 5 RBC/hpf   WBC, UA 11-20 0 - 5 WBC/hpf   Bacteria, UA NONE SEEN NONE SEEN   Squamous Epithelial / LPF 0-5 0 - 5  Troponin I (High Sensitivity)  Result Value Ref Range   Troponin I (High Sensitivity) 19.00 (H) <18 ng/L  CK  Result Value Ref Range   Total CK 2,651 (H) 38 - 234 U/L  I-stat chem 8, ED  Result Value Ref Range   Sodium 149 (H) 135 - 145 mmol/L   Potassium 4.4 3.5 - 5.1 mmol/L   Chloride 112 (H) 98 - 111 mmol/L   BUN 34 (H) 8 - 23 mg/dL   Creatinine, Ser 5.401.00 0.44 - 1.00 mg/dL   Glucose, Bld 981128 (H) 70 - 99 mg/dL   Calcium, Ion 1.911.19 4.781.15 - 1.40 mmol/L   TCO2 27 22 - 32 mmol/L   Hemoglobin 17.7 (H) 12.0 - 15.0 g/dL   HCT 29.552.0 (H) 62.136.0 - 30.846.0 %   Dg Chest 1 View Result Date: 09/20/2018 CLINICAL DATA:  Left side weak this EXAM: CHEST  1 VIEW COMPARISON:  Portable exam 1149 hours compared to 02/18/2008 FINDINGS: Enlargement of cardiac silhouette. Mediastinal contours and pulmonary vascularity normal. Lungs clear. No infiltrate, pleural effusion or pneumothorax. Bones demineralized. IMPRESSION: Enlargement of cardiac silhouette. No acute abnormalities. Electronically Signed   By: Ulyses SouthwardMark  Boles M.D.   On: 09/20/2018 12:20   Ct Head Wo Contrast Result Date: 09/20/2018 CLINICAL DATA:  Left-sided weakness, facial droop, and slurred speech. EXAM: CT HEAD WITHOUT CONTRAST CT CERVICAL SPINE WITHOUT CONTRAST TECHNIQUE: Multidetector CT imaging of the head and cervical spine was performed following the standard protocol without intravenous contrast. Multiplanar CT image reconstructions of the cervical spine were also generated. COMPARISON:  None. FINDINGS: CT HEAD FINDINGS Brain: A moderate-sized region of low density involving cortex and white matter in the right temporal, parietal, and frontal lobes and insula is consistent  with a subacute infarct. There is also some right basal ganglia involvement. There is regional mass effect with slight effacement of the right lateral ventricle and 3 mm of leftward midline shift. No acute intracranial hemorrhage or extra-axial fluid collection is identified. They were is no age advanced atrophy. Vascular: Calcified atherosclerosis at the skull base. Left cavernous sinus gas, possibly related to vena puncture. Hyperdense appearance of the right MCA in the sylvian fissure in the area of infarct. Skull: No fracture or focal osseous lesion. Sinuses/Orbits: Visualized paranasal sinuses and mastoid air cells are clear. Orbits are unremarkable. Other: None. CT CERVICAL SPINE FINDINGS Alignment: Straightening/slight reversal of the normal cervical lordosis. No significant listhesis. Skull base and vertebrae: No acute fracture or suspicious osseous lesion. Soft tissues and spinal canal: No prevertebral fluid or swelling. No visible canal hematoma. Disc levels: Moderate cervical spondylosis. Multilevel neural foraminal stenosis due to uncovertebral spurring, moderate to severe bilaterally at C4-5. Upper chest: Clear lung apices. Other: None. IMPRESSION: 1. Moderately large subacute right MCA infarct with 3 mm of leftward midline shift. No hemorrhage. 2. No acute cervical spine fracture. Electronically Signed   By: Sebastian AcheAllen  Grady M.D.   On: 09/20/2018 12:44   Ct Cervical Spine Wo Contrast Result Date: 09/20/2018 CLINICAL  DATA:  Left-sided weakness, facial droop, and slurred speech. EXAM: CT HEAD WITHOUT CONTRAST CT CERVICAL SPINE WITHOUT CONTRAST TECHNIQUE: Multidetector CT imaging of the head and cervical spine was performed following the standard protocol without intravenous contrast. Multiplanar CT image reconstructions of the cervical spine were also generated. COMPARISON:  None. FINDINGS: CT HEAD FINDINGS Brain: A moderate-sized region of low density involving cortex and white matter in the right  temporal, parietal, and frontal lobes and insula is consistent with a subacute infarct. There is also some right basal ganglia involvement. There is regional mass effect with slight effacement of the right lateral ventricle and 3 mm of leftward midline shift. No acute intracranial hemorrhage or extra-axial fluid collection is identified. They were is no age advanced atrophy. Vascular: Calcified atherosclerosis at the skull base. Left cavernous sinus gas, possibly related to vena puncture. Hyperdense appearance of the right MCA in the sylvian fissure in the area of infarct. Skull: No fracture or focal osseous lesion. Sinuses/Orbits: Visualized paranasal sinuses and mastoid air cells are clear. Orbits are unremarkable. Other: None. CT CERVICAL SPINE FINDINGS Alignment: Straightening/slight reversal of the normal cervical lordosis. No significant listhesis. Skull base and vertebrae: No acute fracture or suspicious osseous lesion. Soft tissues and spinal canal: No prevertebral fluid or swelling. No visible canal hematoma. Disc levels: Moderate cervical spondylosis. Multilevel neural foraminal stenosis due to uncovertebral spurring, moderate to severe bilaterally at C4-5. Upper chest: Clear lung apices. Other: None. IMPRESSION: 1. Moderately large subacute right MCA infarct with 3 mm of leftward midline shift. No hemorrhage. 2. No acute cervical spine fracture. Electronically Signed   By: Sebastian AcheAllen  Grady M.D.   On: 09/20/2018 12:44    Rachel Fox was evaluated in Emergency Department on 09/20/2018 for the symptoms described in the history of present illness. She was evaluated in the context of the global COVID-19 pandemic, which necessitated consideration that the patient might be at risk for infection with the SARS-CoV-2 virus that causes COVID-19. Institutional protocols and algorithms that pertain to the evaluation of patients at risk for COVID-19 are in a state of rapid change based on information released by  regulatory bodies including the CDC and federal and state organizations. These policies and algorithms were followed during the patient's care in the ED.    1300:  Pt not code stroke given unknown onset of symptoms (possibly Friday). IV cardizem bolus and gtt started for afib/RVR on arrival (pt does not appear to have hx of same). HR continues to elevate into 170's; 2nd IV cardizem bolus ordered. BP trending downward. Judicious IVF bolus/gtt given. Pt remains awake/alert, resps without distress.  CT-H as above; will have TeleNeuro MD evaluate.   1350:  Tele Neuro Dr. Juliane Pootiordan has evaluated pt: recommends ASA 325mg , no anticoagulation, pt does have risk of herniation given shift on initial CT scan, recommends repeat CT-H this afternoon, consult Neurosurgery, admit to ICU.    1440:  BP trending downward after judicious IVF and IV cardizem bolus x2 and gtt infusing. HR however remains 140's and will increase to 170's. T/C returned from Cards Dr. Wyline MoodBranch, case discussed, including:  HPI, pertinent PM/SHx, VS/PE, dx testing, ED course and treatment, as well as TeleNeuro MD recommendations:  Agreeable to come to ED for evaluation/consultation.   1505:  Cards MD at bedside for consultation re: new onset afib/RVR. Awaiting NeuroSurgeon call back re: ?need for intervention, ?ICU admit. [facility] Sign out to Dr. Ranae PalmsYelverton.  Final Clinical Impressions(s) / ED Diagnoses   Final diagnoses:  Left-sided weakness    ED Discharge Orders    None       Samuel JesterMcManus, Dyneisha Murchison, DO 09/20/18 1505

## 2018-09-20 NOTE — Consult Note (Signed)
Reason for consult: Acute stroke on CT head  Requesting Physician: Dr. Olevia Bowens    Chief Complaint: Altered mental status, fall  History obtained from: Patient and Chart    HPI:                                                                                                                                       Rachel Fox is a 73 y.o. female with past medical history significant for hypertension, hyperlipidemia, type 2 diabetes mellitus admitted today after being found on the floor.  Her last seen normal was Friday. Patient states that she suddenly became weak and fell on the floor and could not get up.  She was laying on the floor for nearly 2 days until family found her.  On arrival to the ER, CT head was performed and showed moderate to large right-sided subacute CVA.  Patient also noted to be in atrial fibrillation with RVR.  Patient was started on digoxin and is on beta blockers for rate control.  She was briefly started on Cardizem which has been subsequently stopped due to decrease in blood pressure.  Neurology was consulted for recommendations regarding acute stroke.  Patient is outside the window for TPA as well as mechanical thrombectomy.  Date last known well: 7- 3-20 tPA Given: No, outside window NIHSS: 14 Baseline MRS 0   Past Medical History:  Diagnosis Date  . High cholesterol   . Hypertension   . Type 2 diabetes mellitus ([place])     Past Surgical History:  Procedure Laterality Date  . ABDOMINAL HYSTERECTOMY    . TUBAL LIGATION      Family History  Problem Relation Age of Onset  . Stroke Mother   . Heart attack Father   . Stroke Sister   . Heart attack Sister    Social History:  reports that she has quit smoking. She has never used smokeless tobacco. She reports that she does not drink alcohol or use drugs.  Allergies: No Known Allergies  Medications:                                                                                                                         I reviewed home medications.  None compliant with medications   ROS:  14 systems reviewed and negative except above    Examination:                                                                                                      General: Appears well-developed  Psych: Affect appropriate to situation Eyes: No scleral injection HENT: No OP obstrucion Head: Normocephalic.  Cardiovascular: Normal rate and regular rhythm.  Respiratory: Effort normal and breath sounds normal to anterior ascultation GI: Soft.  No distension. There is no tenderness.  Skin: WDI    Neurological Examination Mental Status: Alert, oriented, thought content appropriate.  Speech slightly dysarthric.  No evidence of aphasia.  Able to follow commands Cranial Nerves: II: Visual fields : Left homonymous hemianopsia III,IV, VI: ptosis not present, extra-ocular motions intact bilaterally, pupils equal, round, reactive to light and accommodation V,VII: Left facial droop VIII: hearing normal bilaterally IX,X: uvula rises symmetrically XI: bilateral shoulder shrug XII: midline tongue extension Motor: Right : Upper extremity   5/5    Left:     Upper extremity   0/5  Lower extremity   5/5     Lower extremity   1/5 Tone and bulk:normal tone throughout; no atrophy noted Sensory: Reduced sensation to light touch in the left upper and lower extremity, no neglect Deep Tendon Reflexes: 2+ and symmetric throughout Plantars: Right: downgoing   Left: downgoing Cerebellar: normal finger-to-nose on the right side Gait: Unable to walk     Lab Results: Basic Metabolic Panel: Recent Labs  Lab 09/20/18 1130 09/20/18 1142  NA 149* 146*  K 4.4 3.8  CL 112* 107  CO2  --  23  GLUCOSE 128* 132*  BUN 34* 31*  CREATININE 1.00 1.04*  CALCIUM  --  10.4*  MG  --  2.3  PHOS  --   2.7    CBC: Recent Labs  Lab 09/20/18 1130 09/20/18 1142  WBC  --  14.0*  NEUTROABS  --  11.5*  HGB 17.7* 15.5*  HCT 52.0* 49.2*  MCV  --  84.7  PLT  --  207    Coagulation Studies: Recent Labs    09/20/18 1142  LABPROT 13.6  INR 1.1    Imaging: Dg Chest 1 View  Result Date: 09/20/2018 CLINICAL DATA:  Left side weak this EXAM: CHEST  1 VIEW COMPARISON:  Portable exam 1149 hours compared to 02/18/2008 FINDINGS: Enlargement of cardiac silhouette. Mediastinal contours and pulmonary vascularity normal. Lungs clear. No infiltrate, pleural effusion or pneumothorax. Bones demineralized. IMPRESSION: Enlargement of cardiac silhouette. No acute abnormalities. Electronically Signed   By: Ulyses SouthwardMark  Boles M.D.   On: 09/20/2018 12:20   Ct Head Wo Contrast  Result Date: 09/20/2018 CLINICAL DATA:  Left-sided weakness, facial droop, and slurred speech. EXAM: CT HEAD WITHOUT CONTRAST CT CERVICAL SPINE WITHOUT CONTRAST TECHNIQUE: Multidetector CT imaging of the head and cervical spine was performed following the standard protocol without intravenous contrast. Multiplanar CT image reconstructions of the cervical spine were also generated. COMPARISON:  None. FINDINGS: CT HEAD FINDINGS Brain: A moderate-sized region of low density involving cortex and white matter  in the right temporal, parietal, and frontal lobes and insula is consistent with a subacute infarct. There is also some right basal ganglia involvement. There is regional mass effect with slight effacement of the right lateral ventricle and 3 mm of leftward midline shift. No acute intracranial hemorrhage or extra-axial fluid collection is identified. They were is no age advanced atrophy. Vascular: Calcified atherosclerosis at the skull base. Left cavernous sinus gas, possibly related to vena puncture. Hyperdense appearance of the right MCA in the sylvian fissure in the area of infarct. Skull: No fracture or focal osseous lesion. Sinuses/Orbits:  Visualized paranasal sinuses and mastoid air cells are clear. Orbits are unremarkable. Other: None. CT CERVICAL SPINE FINDINGS Alignment: Straightening/slight reversal of the normal cervical lordosis. No significant listhesis. Skull base and vertebrae: No acute fracture or suspicious osseous lesion. Soft tissues and spinal canal: No prevertebral fluid or swelling. No visible canal hematoma. Disc levels: Moderate cervical spondylosis. Multilevel neural foraminal stenosis due to uncovertebral spurring, moderate to severe bilaterally at C4-5. Upper chest: Clear lung apices. Other: None. IMPRESSION: 1. Moderately large subacute right MCA infarct with 3 mm of leftward midline shift. No hemorrhage. 2. No acute cervical spine fracture. Electronically Signed   By: Sebastian AcheAllen  Grady M.D.   On: 09/20/2018 12:44   Ct Cervical Spine Wo Contrast  Result Date: 09/20/2018 CLINICAL DATA:  Left-sided weakness, facial droop, and slurred speech. EXAM: CT HEAD WITHOUT CONTRAST CT CERVICAL SPINE WITHOUT CONTRAST TECHNIQUE: Multidetector CT imaging of the head and cervical spine was performed following the standard protocol without intravenous contrast. Multiplanar CT image reconstructions of the cervical spine were also generated. COMPARISON:  None. FINDINGS: CT HEAD FINDINGS Brain: A moderate-sized region of low density involving cortex and white matter in the right temporal, parietal, and frontal lobes and insula is consistent with a subacute infarct. There is also some right basal ganglia involvement. There is regional mass effect with slight effacement of the right lateral ventricle and 3 mm of leftward midline shift. No acute intracranial hemorrhage or extra-axial fluid collection is identified. They were is no age advanced atrophy. Vascular: Calcified atherosclerosis at the skull base. Left cavernous sinus gas, possibly related to vena puncture. Hyperdense appearance of the right MCA in the sylvian fissure in the area of infarct.  Skull: No fracture or focal osseous lesion. Sinuses/Orbits: Visualized paranasal sinuses and mastoid air cells are clear. Orbits are unremarkable. Other: None. CT CERVICAL SPINE FINDINGS Alignment: Straightening/slight reversal of the normal cervical lordosis. No significant listhesis. Skull base and vertebrae: No acute fracture or suspicious osseous lesion. Soft tissues and spinal canal: No prevertebral fluid or swelling. No visible canal hematoma. Disc levels: Moderate cervical spondylosis. Multilevel neural foraminal stenosis due to uncovertebral spurring, moderate to severe bilaterally at C4-5. Upper chest: Clear lung apices. Other: None. IMPRESSION: 1. Moderately large subacute right MCA infarct with 3 mm of leftward midline shift. No hemorrhage. 2. No acute cervical spine fracture. Electronically Signed   By: Sebastian AcheAllen  Grady M.D.   On: 09/20/2018 12:44     ASSESSMENT AND PLAN  73 year old female with past medical history of hypertension, hyperlipidemia, diabetes mellitus presents to the emergency department after being found on the floor for 2 days with left hemiplegia.  CT head shows moderate to large right MCA stroke.  Outside window for TPA and mechanical thrombectomy.  There is some early midline shift however, patient appears to be low risk for malignant cerebral edema.  Etiology of stroke likely atrial fibrillation, full stroke work-up pending.  Patient also noted to be in A. fib with RVR on arrival.  Agree with holding Cardizem and would avoid cardioversion due to increased risk of stroke.  Patient also increased risk of hemorrhagic conversion with anticoagulation and would recommend holding off on starting anticoagulation for 3 to 5 days.   Acute Ischemic Stroke   Risk factors : Hypertension, hyperlipidemia, diabetes mellitus, new onset A. fib Etiology: Likely cardioembolic  Recommend #MRA Head and neck  #Transthoracic Echo  # Start patient on ASA 325mg  daily, would wait additional 3 to 5  days before starting patient on anticoagulation due to moderate size of stroke.  #Avoid cardioversion until patient is on anticoagulation #Start or continue Atorvastatin 80 mg/other high intensity statin # BP goal: permissive HTN # HBAIC and Lipid profile # Telemetry monitoring # Frequent neuro checks # NPO until passes stroke swallow screen  Please page stroke NP  Or  PA  Or MD from 8am -4 pm  as this patient from this time will be  followed by the stroke.   You can look them up on [email or web address]  Password [email or web address]     [facility] Pager Number [phone]

## 2018-09-20 NOTE — Consult Note (Signed)
Cardiology Consultation:   Patient ID: Rachel Fox MRN: [id number]; DOB: 26-Jan-1946  Admit date: 09/20/2018 Date of Consult: 09/20/2018  Primary Care Provider: Celene Squibb, MD Primary Cardiologist: New Primary Electrophysiologist:  None    Patient Profile:   Rachel Fox is a 73 y.o. female with a hx of DM, HTN, HL who is being seen today for the evaluation of new onset afib with RVR after presenting with subacute CVA at the request of Dr Thurnell Garbe.  History of Present Illness:   Rachel Fox is a 73 yo female history of DM, HTN, HL admitted with fall and left sided weakness at home. Appears from notes fall occurred about 3 days ago, assumed by family she may have been on the floor since possibly Friday from the fall. Found to have left sided facial droop, LUE and LLE extremity weakness. Found to be in afib with RVR in ER, new diagnosis for patient. Cardiology has been consulted.   Patient is able to give limited history due to CVA. Denies any palpitations, no chest pain or SOB.    K 4.4 Cr 1.00 WBC 14 Hgb 15.5 Plt 207 CK 2651 EKG afib with RVR CXR no acute process CT head right MCA infarct, 3 mm leftward shift hsTrop 19 COVID neg     Heart Pathway Score:     Past Medical History:  Diagnosis Date   Diabetes mellitus without complication (HCC)    High cholesterol    Hypertension     Past Surgical History:  Procedure Laterality Date   ABDOMINAL HYSTERECTOMY        Inpatient Medications: Scheduled Meds:  Continuous Infusions:  sodium chloride 125 mL/hr at 09/20/18 1126   diltiazem (CARDIZEM) infusion 10 mg/hr (09/20/18 1231)   PRN Meds:   Allergies:   No Known Allergies  Social History:   Social History   Socioeconomic History   Marital status: Divorced    Spouse name: Not on file   Number of children: Not on file   Years of education: Not on file   Highest education level: Not on file  Occupational History   Not on file  Social Needs     Financial resource strain: Not on file   Food insecurity    Worry: Not on file    Inability: Not on file   Transportation needs    Medical: Not on file    Non-medical: Not on file  Tobacco Use   Smoking status: Former Smoker  Substance and Sexual Activity   Alcohol use: Never    Frequency: Never   Drug use: Never   Sexual activity: Not on file  Lifestyle   Physical activity    Days per week: Not on file    Minutes per session: Not on file   Stress: Not on file  Relationships   Social connections    Talks on phone: Not on file    Gets together: Not on file    Attends religious service: Not on file    Active member of club or organization: Not on file    Attends meetings of clubs or organizations: Not on file    Relationship status: Not on file   Intimate partner violence    Fear of current or ex partner: Not on file    Emotionally abused: Not on file    Physically abused: Not on file    Forced sexual activity: Not on file  Other Topics Concern   Not on file  Social  History Narrative   Not on file    Family History:   History reviewed. No pertinent family history.   ROS:  Please see the history of present illness.  All other ROS reviewed and negative.     Physical Exam/Data:   Vitals:   09/20/18 1330 09/20/18 1400 09/20/18 1415 09/20/18 1430  BP: 116/85 (!) 137/95  (!) 134/119  Pulse: 96 98 (!) 57 (!) 140  Resp: (!) 28 18 20  (!) 22  Temp:      TempSrc:      SpO2: 96% 98% 98% 99%  Weight:      Height:        Intake/Output Summary (Last 24 hours) at 09/20/2018 1437 Last data filed at 09/20/2018 1435 Gross per 24 hour  Intake 1016.33 ml  Output --  Net 1016.33 ml   Last 3 Weights 09/20/2018  Weight (lbs) 156 lb  Weight (kg) 70.761 kg     Body mass index is 25.18 kg/m.  General:  Well nourished, well developed, in no acute distress HEENT: normal Lymph: no adenopathy Neck: no JVD Endocrine:  No thryomegaly Vascular: No carotid bruits; FA  pulses 2+ bilaterally without bruits  Cardiac:  Irregular, tach Lungs:  clear to auscultation bilaterally, no wheezing, rhonchi or rales  Abd: soft, nontender, no hepatomegaly  Ext: no edema Musculoskeletal:  No deformities, BUE and BLE strength normal and equal Skin: warm and dry  Neuro:  Left sided weakness Psych:  Normal affect     Laboratory Data:  High Sensitivity Troponin:   Recent Labs  Lab 09/20/18 1142  TROPONINIHS 19.00*     Cardiac EnzymesNo results for input(s): TROPONINI in the last 168 hours. No results for input(s): TROPIPOC in the last 168 hours.  Chemistry Recent Labs  Lab 09/20/18 1130 09/20/18 1142  NA 149* 146*  K 4.4 3.8  CL 112* 107  CO2  --  23  GLUCOSE 128* 132*  BUN 34* 31*  CREATININE 1.00 1.04*  CALCIUM  --  10.4*  GFRNONAA  --  54*  GFRAA  --  >60  ANIONGAP  --  16*    Recent Labs  Lab 09/20/18 1142  PROT 8.5*  ALBUMIN 4.2  AST 63*  ALT 30  ALKPHOS 61  BILITOT 1.3*   Hematology Recent Labs  Lab 09/20/18 1130 09/20/18 1142  WBC  --  14.0*  RBC  --  5.81*  HGB 17.7* 15.5*  HCT 52.0* 49.2*  MCV  --  84.7  MCH  --  26.7  MCHC  --  31.5  RDW  --  14.9  PLT  --  207   BNPNo results for input(s): BNP, PROBNP in the last 168 hours.  DDimer No results for input(s): DDIMER in the last 168 hours.   Radiology/Studies:  Dg Chest 1 View  Result Date: 09/20/2018 CLINICAL DATA:  Left side weak this EXAM: CHEST  1 VIEW COMPARISON:  Portable exam 1149 hours compared to 02/18/2008 FINDINGS: Enlargement of cardiac silhouette. Mediastinal contours and pulmonary vascularity normal. Lungs clear. No infiltrate, pleural effusion or pneumothorax. Bones demineralized. IMPRESSION: Enlargement of cardiac silhouette. No acute abnormalities. Electronically Signed   By: Ulyses SouthwardMark  Boles M.D.   On: 09/20/2018 12:20   Ct Head Wo Contrast  Result Date: 09/20/2018 CLINICAL DATA:  Left-sided weakness, facial droop, and slurred speech. EXAM: CT HEAD WITHOUT  CONTRAST CT CERVICAL SPINE WITHOUT CONTRAST TECHNIQUE: Multidetector CT imaging of the head and cervical spine was performed following the standard protocol  without intravenous contrast. Multiplanar CT image reconstructions of the cervical spine were also generated. COMPARISON:  None. FINDINGS: CT HEAD FINDINGS Brain: A moderate-sized region of low density involving cortex and white matter in the right temporal, parietal, and frontal lobes and insula is consistent with a subacute infarct. There is also some right basal ganglia involvement. There is regional mass effect with slight effacement of the right lateral ventricle and 3 mm of leftward midline shift. No acute intracranial hemorrhage or extra-axial fluid collection is identified. They were is no age advanced atrophy. Vascular: Calcified atherosclerosis at the skull base. Left cavernous sinus gas, possibly related to vena puncture. Hyperdense appearance of the right MCA in the sylvian fissure in the area of infarct. Skull: No fracture or focal osseous lesion. Sinuses/Orbits: Visualized paranasal sinuses and mastoid air cells are clear. Orbits are unremarkable. Other: None. CT CERVICAL SPINE FINDINGS Alignment: Straightening/slight reversal of the normal cervical lordosis. No significant listhesis. Skull base and vertebrae: No acute fracture or suspicious osseous lesion. Soft tissues and spinal canal: No prevertebral fluid or swelling. No visible canal hematoma. Disc levels: Moderate cervical spondylosis. Multilevel neural foraminal stenosis due to uncovertebral spurring, moderate to severe bilaterally at C4-5. Upper chest: Clear lung apices. Other: None. IMPRESSION: 1. Moderately large subacute right MCA infarct with 3 mm of leftward midline shift. No hemorrhage. 2. No acute cervical spine fracture. Electronically Signed   By: Sebastian AcheAllen  Grady M.D.   On: 09/20/2018 12:44   Ct Cervical Spine Wo Contrast  Result Date: 09/20/2018 CLINICAL DATA:  Left-sided  weakness, facial droop, and slurred speech. EXAM: CT HEAD WITHOUT CONTRAST CT CERVICAL SPINE WITHOUT CONTRAST TECHNIQUE: Multidetector CT imaging of the head and cervical spine was performed following the standard protocol without intravenous contrast. Multiplanar CT image reconstructions of the cervical spine were also generated. COMPARISON:  None. FINDINGS: CT HEAD FINDINGS Brain: A moderate-sized region of low density involving cortex and white matter in the right temporal, parietal, and frontal lobes and insula is consistent with a subacute infarct. There is also some right basal ganglia involvement. There is regional mass effect with slight effacement of the right lateral ventricle and 3 mm of leftward midline shift. No acute intracranial hemorrhage or extra-axial fluid collection is identified. They were is no age advanced atrophy. Vascular: Calcified atherosclerosis at the skull base. Left cavernous sinus gas, possibly related to vena puncture. Hyperdense appearance of the right MCA in the sylvian fissure in the area of infarct. Skull: No fracture or focal osseous lesion. Sinuses/Orbits: Visualized paranasal sinuses and mastoid air cells are clear. Orbits are unremarkable. Other: None. CT CERVICAL SPINE FINDINGS Alignment: Straightening/slight reversal of the normal cervical lordosis. No significant listhesis. Skull base and vertebrae: No acute fracture or suspicious osseous lesion. Soft tissues and spinal canal: No prevertebral fluid or swelling. No visible canal hematoma. Disc levels: Moderate cervical spondylosis. Multilevel neural foraminal stenosis due to uncovertebral spurring, moderate to severe bilaterally at C4-5. Upper chest: Clear lung apices. Other: None. IMPRESSION: 1. Moderately large subacute right MCA infarct with 3 mm of leftward midline shift. No hemorrhage. 2. No acute cervical spine fracture. Electronically Signed   By: Sebastian AcheAllen  Grady M.D.   On: 09/20/2018 12:44    Assessment and Plan:    1. Afib with RVR - presented with subacute right MCA infarct, new diagnosis of afib this admission - afib with RVR, started on dilt gtt in ER. Currently on 15mg /hr, remains tachy 170s. BP's have come fairly quickly on dilt gtt from 200s  down to 110s. At this time unclear if plans for permissive HTN for her acute CVA. - would avoid amiodarone in setting of possible cardioembolic CVA - if dilt gtt does not control rates or bp's too low from CVA standpoint options would be short term use of IV digoxin. If reasonable bp's could add IV lopressor boluses. I think given the fairly rapid lowering of her bp on dilt gtt would favor digoxin loading at this time so as not to lower bp further. Will dose IV digoxin 0.375mg  x 1 then 0.25mg  q6hrs x 2 doses.   - defer timing of anticoagulation to neuroogy given recent CVA and risk for hemorragic conversion - echo once heart rates better controlled    2. Subacute CVA - per neurology, with some midline shift ER is also to discuss with neurology.     For questions or updates, please contact [facility] Please consult [email or web address] for contact info under     Signed, Dina RichBranch, Chayah Mckee, MD  09/20/2018 2:37 PM

## 2018-09-20 NOTE — ED Notes (Signed)
Pt's niece and son updated on condition.

## 2018-09-20 NOTE — Progress Notes (Signed)
Pt admitted to the unit from [facility] via care link during change of shift; pt alert and verbally responsive; telemetry applied and verified with CCMD: second RN called to second verify; pt oriented to the unit and room; fall/safety precaution and prevention education completed with pt; skin assessment completed with oncoming RN Wende Crease; and Sonya; pt noted to have open blister to left buttock; site cleansed, xeroform and foam dsg applied; VSS; pt remains in A-fib RVR; pt has Cardizem drip and fluid infusing; prn lopressor given; pt in bed with call light within reach; bed alarm on; call light within reach; reported off to oncoming RN. Delia Heady RN   09/20/18 1904  Vitals  BP 106/79  MAP (mmHg) 89  BP Location Right Arm  BP Method Automatic  Patient Position (if appropriate) Lying  Pulse Rate Source Monitor  ECG Heart Rate (!) 158  Resp (!) 23  Oxygen Therapy  SpO2 96 %  O2 Device Room Air  MEWS Score  MEWS RR 1  MEWS Pulse 3  MEWS Systolic 0  MEWS LOC 0  MEWS Temp 0  MEWS Score 4  MEWS Score Color Red

## 2018-09-20 NOTE — ED Triage Notes (Signed)
Pt arrives from home by ems after her son found her between the dresser and wall. Significant left sided facial droop and complete left side paralysis with slurred speech. Last seen by family on Friday. Assuming she has been in floor since Friday.

## 2018-09-20 NOTE — Consult Note (Signed)
[facility] Tele[facility]urology Consult Services  Stat Consult  Date of Service:   09/20/2018 13:03:47  Impression:     .  Rule Out Acute Ischemic Stroke  Comments/Sign-Out: the patient has had a subacute right hemispheric stroke syndrome. She has a mild amount of right to left shift at 3 mm. She'll need a close follow-up CT of the head without contrast this evening to ensure this does not worsen. Presumably her stroke is at least several days old. Recommend she be started on aspirin for now she is in atrial fibrillation with a rapid rate but with the size of stroke would not want to fully anticoagulate her for some time to avoid hemorrhagic conversion.given her elevated creatinine kinase likely will have to hold statin therapy would check LDL and hemoglobin A1c. She needs PTOT speech therapy Would d/w neurosurgery although given only 62mm at this point not really a hemicraniectomy candidate at this point unless imaging worsens/exam declines.  Needs close observation in ICU given risk of worsening edemea CT HEAD: Reviewed 1. Moderately large subacute right MCA infarct with 3 mm of leftward midline shift. No hemorrhage.  Metrics: [facility] Notification Time: 09/20/2018 13:02:49 Stamp Time: 09/20/2018 13:03:47 Callback Response Time: 09/20/2018 13:09:06 Video Start Time: 09/20/2018 13:19:00 Video End Time: 09/20/2018 13:30:00  Our recommendations are outlined below.  Recommendations:     .  Initiate Aspirin 325 MG Daily  Imaging Studies:     .  MRI Head     .  Carotid Dopplers     .  Echocardiogram - Transthoracic Echocardiogram  Therapies:     .  Physical Therapy, Occupational Therapy, Speech Therapy Assessment When Applicable  Disposition: Neurology Follow Up Recommended  Sign Out:     .  Discussed with Emergency Department Provider  ----------------------------------------------------------------------------------------------------  Chief  Complaint: stroke  History of Present Illness: Patient is a 73 year old Female.  The patient lives alon [facility] last firday. Has left sided wakness/confusion CK 2600 CT head with a large R MCA territory stroke. Has HTN, DM, HLD. Not on any thinners. Has new onset a-fib with RVR also.  Examination: BP(119/73), 1A: Level of Consciousness - Alert; keenly responsive + 0 1B: Ask Month and Age - Both Questions Right + 0 1C: Blink Eyes & Squeeze Hands - Performs Both Tasks + 0 2: Test Horizontal Extraocular Movements - Partial Gaze Palsy: Corrects with Oculocephalic Reflex + 1 3: Test Visual Fields - No Visual Loss + 0 4: Test Facial Palsy (Use Grimace if Obtunded) - Partial paralysis (lower face) + 2 5A: Test Left Arm Motor Drift - No Effort Against Gravity + 3 5B: Test Right Arm Motor Drift - No Drift for 10 Seconds + 0 6A: Test Left Leg Motor Drift - No Movement + 4 6B: Test Right Leg Motor Drift - No Drift for 5 Seconds + 0 7: Test Limb Ataxia (FNF/Heel-Shin) - No Ataxia + 0 8: Test Sensation - Normal; No sensory loss + 0 9: Test Language/Aphasia - Normal; No aphasia + 0 10: Test Dysarthria - Mild-Moderate Dysarthria: Slurring but can be understood + 1 11: Test Extinction/Inattention - No abnormality + 0  NIHSS Score: 11   Due to the immediate potential for life-threatening deterioration due to underlying acute neurologic illness, I spent 35 minutes providing critical care. This time includes time for face to face visit via telemedicine, review of medical records, imaging studies and discussion of findings with providers, the patient and/or family.   Dr Katina Degree   [facility] (  [phone]  

## 2018-09-20 NOTE — H&P (Addendum)
History and Physical    Rachel Fox VHQ:[id number] DOB: 1945-12-07 DOA: 09/20/2018  PCP: Benita StabileHall, John Z, MD   Patient coming from: Home.  I have personally briefly [facility]  Chief Complaint: Fall.  HPI: Rachel Fox is a 73 y.o. female with medical history significant of hyperlipidemia, hypertension, type 2 diabetes who is brought to the emergency department after being found on the floor at home earlier today.  Apparently, the patient fell at home on Friday and was by herself during the weekend.  She says that she was unable to stand up, but was able to crawl to her water dispenser and to the bathroom.  She denies trauma to the head or LOC.  No fever or chills to her knowledge.  Denies chest pain, palpitations, abdominal pain, nausea or emesis, diarrhea constipation, melena or hematochezia.  No dysuria, frequency or hematuria, but state that she has urinated a lot less than usual and her urine has been looking very dark.  ED Course: Initial vital signs temperature 98.2 F, pulse 187, respiration 20, blood pressure 212/171 mmHg and O2 sat 99% on room.  She was started on the Cardizem infusion, which was subsequently stopped due to decrease in BP.  Cardiology consulted and started the patient on digoxin.  UDS was negative.  Urinalysis showed moderate hemoglobinuria, ketones of 20 and proteinuria 100 mg/dL.  Trace leukocyte esterase.  Only 0-5 RBC in microscopic exam.  CBC white count was 14.0, hemoglobin 15.5 g/dL and platelets 413207.  PT/INR/PTT within normal limits.  Total CK was 2651.  Sodium was 146, potassium 3.8, chloride 107 and CO2 23 mmol/L.  Her glucose 132, BUN 31 and creatinine 1.04 mg/dL.  AST was 63 and bilirubin 1.3.  Chest radiograph shows cardiomegaly, but no vascular congestion or infiltrate.  CT head shows large right-sided CVA.  Review of Systems: As per HPI otherwise 10 point review of systems negative.  Past Medical  History:  Diagnosis Date   High cholesterol    Hypertension    Type 2 diabetes mellitus (HCC)     Past Surgical History:  Procedure Laterality Date   ABDOMINAL HYSTERECTOMY     TUBAL LIGATION       reports that she has quit smoking. She has never used smokeless tobacco. She reports that she does not drink alcohol or use drugs.  No Known Allergies  Family History  Problem Relation Age of Onset   Stroke Mother    Heart attack Father    Stroke Sister    Heart attack Sister    Prior to Admission medications   Medication Sig Start Date End Date Taking? Authorizing Provider  amLODipine (NORVASC) 10 MG tablet Take 1 tablet by mouth daily. 06/24/18  Yes [provider]  metFORMIN (GLUCOPHAGE) 500 MG tablet Take 1 tablet by mouth 2 (two) times a day. 06/20/18  Yes [provider]  metoprolol-hydrochlorothiazide (LOPRESSOR HCT) 50-25 MG tablet Take 1 tablet by mouth 2 (two) times a day. 08/10/18  Yes [provider]  telmisartan (MICARDIS) 80 MG tablet Take 1 tablet by mouth daily. 09/18/18  Yes [provider]    Physical Exam: Vitals:   09/20/18 1615 09/20/18 1630 09/20/18 1645 09/20/18 1700  BP:    129/69  Pulse: (!) 45 (!) 52 (!) 109 92  Resp: 20 20 (!) 35 19  Temp:      TempSrc:      SpO2: 99% 98% 97% 95%  Weight:      Height:        Constitutional: NAD, calm, comfortable Eyes: PERRL, lids and conjunctivae normal ENMT: Mucous membranes are moist. Posterior pharynx clear of any exudate or lesions. Neck: normal, supple, no masses, no thyromegaly Respiratory: clear to auscultation bilaterally, no wheezing, no crackles. Normal respiratory effort. No accessory muscle use.  Cardiovascular: Irregularly irregular with a heart rate between 140s to 180s, no murmurs / rubs / gallops. No extremity edema. 2+ pedal pulses. No carotid bruits.  Abdomen: Soft, no tenderness, no masses palpated. No hepatosplenomegaly. Bowel sounds positive.    Musculoskeletal: no clubbing / cyanosis.  Good ROM, no contractures. Normal muscle tone.  Skin: no rashes, lesions, ulcers. No induration Neurologic: Left facial droop.  Subjective decrease in sensation on left, DTR are mildly brisk.  1/5 left-sided hemiparesis. Psychiatric: Alert and oriented x 3.  Labs on Admission: I have personally reviewed following labs and imaging studies  CBC: Recent Labs  Lab 09/20/18 1130 09/20/18 1142  WBC  --  14.0*  NEUTROABS  --  11.5*  HGB 17.7* 15.5*  HCT 52.0* 49.2*  MCV  --  84.7  PLT  --  207   Basic Metabolic Panel: Recent Labs  Lab 09/20/18 1130 09/20/18 1142  NA 149* 146*  K 4.4 3.8  CL 112* 107  CO2  --  23  GLUCOSE 128* 132*  BUN 34* 31*  CREATININE 1.00 1.04*  CALCIUM  --  10.4*  MG  --  2.3  PHOS  --  2.7   GFR: Estimated Creatinine Clearance: 46.4 mL/min (A) (by C-G formula based on SCr of 1.04 mg/dL (H)). Liver Function Tests: Recent Labs  Lab 09/20/18 1142  AST 63*  ALT 30  ALKPHOS 61  BILITOT 1.3*  PROT 8.5*  ALBUMIN 4.2   No results for input(s): LIPASE, AMYLASE in the last 168 hours. No results for input(s): AMMONIA in the last 168 hours. Coagulation Profile: Recent Labs  Lab 09/20/18 1142  INR 1.1   Cardiac Enzymes: Recent Labs  Lab 09/20/18 1142  CKTOTAL 2,651*   BNP (last 3 results) No results for input(s): PROBNP in the last 8760 hours. HbA1C: No results for input(s): HGBA1C in the last 72 hours. CBG: No results for input(s): GLUCAP in the last 168 hours. Lipid Profile: No results for input(s): CHOL, HDL, LDLCALC, TRIG, CHOLHDL, LDLDIRECT in the last 72 hours. Thyroid Function Tests: No results for input(s): TSH, T4TOTAL, FREET4, T3FREE, THYROIDAB in the last 72 hours. Anemia Panel: No results for input(s): VITAMINB12, FOLATE, FERRITIN, TIBC, IRON, RETICCTPCT in the last 72 hours. Urine analysis:    Component Value Date/Time   COLORURINE YELLOW 09/20/2018 1246   APPEARANCEUR CLEAR  09/20/2018 1246   LABSPEC 1.023 09/20/2018 1246   PHURINE 5.0 09/20/2018 1246   GLUCOSEU NEGATIVE 09/20/2018 1246   HGBUR MODERATE (A) 09/20/2018 1246   BILIRUBINUR NEGATIVE 09/20/2018 1246   KETONESUR 20 (A) 09/20/2018 1246   PROTEINUR 100 (A) 09/20/2018 1246   NITRITE NEGATIVE 09/20/2018 1246   LEUKOCYTESUR TRACE (A) 09/20/2018 1246    Radiological Exams on Admission: Dg Chest 1 View  Result Date: 09/20/2018 CLINICAL DATA:  Left side weak this EXAM: CHEST  1 VIEW COMPARISON:  Portable exam 1149 hours compared to 02/18/2008 FINDINGS: Enlargement of cardiac silhouette. Mediastinal contours and pulmonary vascularity normal. Lungs clear. No infiltrate, pleural effusion or pneumothorax. Bones demineralized. IMPRESSION: Enlargement of cardiac silhouette. No acute abnormalities. Electronically Signed   By: Angelyn PuntMark  Boles M.D.  On: 09/20/2018 12:20   Ct Head Wo Contrast  Result Date: 09/20/2018 CLINICAL DATA:  Left-sided weakness, facial droop, and slurred speech. EXAM: CT HEAD WITHOUT CONTRAST CT CERVICAL SPINE WITHOUT CONTRAST TECHNIQUE: Multidetector CT imaging of the head and cervical spine was performed following the standard protocol without intravenous contrast. Multiplanar CT image reconstructions of the cervical spine were also generated. COMPARISON:  None. FINDINGS: CT HEAD FINDINGS Brain: A moderate-sized region of low density involving cortex and white matter in the right temporal, parietal, and frontal lobes and insula is consistent with a subacute infarct. There is also some right basal ganglia involvement. There is regional mass effect with slight effacement of the right lateral ventricle and 3 mm of leftward midline shift. No acute intracranial hemorrhage or extra-axial fluid collection is identified. They were is no age advanced atrophy. Vascular: Calcified atherosclerosis at the skull base. Left cavernous sinus gas, possibly related to vena puncture. Hyperdense appearance of the right MCA  in the sylvian fissure in the area of infarct. Skull: No fracture or focal osseous lesion. Sinuses/Orbits: Visualized paranasal sinuses and mastoid air cells are clear. Orbits are unremarkable. Other: None. CT CERVICAL SPINE FINDINGS Alignment: Straightening/slight reversal of the normal cervical lordosis. No significant listhesis. Skull base and vertebrae: No acute fracture or suspicious osseous lesion. Soft tissues and spinal canal: No prevertebral fluid or swelling. No visible canal hematoma. Disc levels: Moderate cervical spondylosis. Multilevel neural foraminal stenosis due to uncovertebral spurring, moderate to severe bilaterally at C4-5. Upper chest: Clear lung apices. Other: None. IMPRESSION: 1. Moderately large subacute right MCA infarct with 3 mm of leftward midline shift. No hemorrhage. 2. No acute cervical spine fracture. Electronically Signed   By: Sebastian AcheAllen  Grady M.D.   On: 09/20/2018 12:44   Ct Cervical Spine Wo Contrast  Result Date: 09/20/2018 CLINICAL DATA:  Left-sided weakness, facial droop, and slurred speech. EXAM: CT HEAD WITHOUT CONTRAST CT CERVICAL SPINE WITHOUT CONTRAST TECHNIQUE: Multidetector CT imaging of the head and cervical spine was performed following the standard protocol without intravenous contrast. Multiplanar CT image reconstructions of the cervical spine were also generated. COMPARISON:  None. FINDINGS: CT HEAD FINDINGS Brain: A moderate-sized region of low density involving cortex and white matter in the right temporal, parietal, and frontal lobes and insula is consistent with a subacute infarct. There is also some right basal ganglia involvement. There is regional mass effect with slight effacement of the right lateral ventricle and 3 mm of leftward midline shift. No acute intracranial hemorrhage or extra-axial fluid collection is identified. They were is no age advanced atrophy. Vascular: Calcified atherosclerosis at the skull base. Left cavernous sinus gas, possibly related  to vena puncture. Hyperdense appearance of the right MCA in the sylvian fissure in the area of infarct. Skull: No fracture or focal osseous lesion. Sinuses/Orbits: Visualized paranasal sinuses and mastoid air cells are clear. Orbits are unremarkable. Other: None. CT CERVICAL SPINE FINDINGS Alignment: Straightening/slight reversal of the normal cervical lordosis. No significant listhesis. Skull base and vertebrae: No acute fracture or suspicious osseous lesion. Soft tissues and spinal canal: No prevertebral fluid or swelling. No visible canal hematoma. Disc levels: Moderate cervical spondylosis. Multilevel neural foraminal stenosis due to uncovertebral spurring, moderate to severe bilaterally at C4-5. Upper chest: Clear lung apices. Other: None. IMPRESSION: 1. Moderately large subacute right MCA infarct with 3 mm of leftward midline shift. No hemorrhage. 2. No acute cervical spine fracture. Electronically Signed   By: Sebastian AcheAllen  Grady M.D.   On: 09/20/2018 12:44  EKG: Independently reviewed.  EKG #1 Vent. rate 189 BPM PR interval * ms QRS duration 55 ms QT/QTc 228/405 ms P-R-T axes * 64 256 Atrial fibrillation with rapid V-rate Ventricular premature complex Repolarization abnormality, prob rate related  EKG #2 Vent. rate 184 BPM PR interval * ms QRS duration 53 ms QT/QTc 256/448 ms P-R-T axes * 64 225 Atrial fibrillation with rapid V-rate Ventricular premature complex Repolarization abnormality, prob rate related  Assessment/Plan Principal Problem:   Cerebrovascular accident (CVA) with involvement of left side of body ([place]) Admit to stepdown/inpatient. Frequent neuro checks. PT/OT/SLP. Swallow screen. Daily aspirin. Check carotid Doppler and echocardiogram. Check MRI of brain. Neurology is following.  Active Problems:   Atrial fibrillation with RVR (HCC) Newly diagnosed. CHA?DS?-VASc Score of at least 6. Currently on digoxin and as needed low-dose IV metoprolol. Monitor heart  rate. Obtain echocardiogram. Cardiology follow-up [facility].    Hypertension Hold antihypertensives for now. Target SBP should be at least 120 mmHg.    Rhabdomyolysis Continue IV hydration. Monitor intake and output. Follow-up renal function electrolytes. Follow-up CK daily. Monitor for volume overload signs.    Type 2 diabetes mellitus (HCC) Check hemoglobin A1c. Hold metformin. CBG monitoring with sensitive SS RI    Hyperlipidemia Check fasting lipids. Currently not on medical therapy.    Hyperbilirubinemia Likely due to volume depletion. Continue IV hydration. Follow-up bilirubin in a.m.    Leukocytosis Secondary to stress and hemoconcentration. Monitor for fever or any other symptoms of infection    Hypernatremia Received NS boluses earlier. Will star   DVT prophylaxis: Lovenox SQ Code Status: Full code. Family Communication: Disposition Plan: Admit to [facility] for CVA work-up and A. fib/RVR control. Consults called: Neurology (Dr. Lorraine Lax) and cardiology (Dr. Harl Bowie). Admission status: Inpatient/stepdown.   Reubin Milan MD [facility]  09/20/2018, 5:30 PM

## 2018-09-21 ENCOUNTER — Inpatient Hospital Stay (HOSPITAL_COMMUNITY): Payer: Medicare Other

## 2018-09-21 ENCOUNTER — Encounter (HOSPITAL_COMMUNITY): Payer: Self-pay | Admitting: *Deleted

## 2018-09-21 DIAGNOSIS — L89620 Pressure ulcer of left heel, unstageable: Secondary | ICD-10-CM | POA: Insufficient documentation

## 2018-09-21 DIAGNOSIS — I639 Cerebral infarction, unspecified: Secondary | ICD-10-CM

## 2018-09-21 DIAGNOSIS — L8990 Pressure ulcer of unspecified site, unspecified stage: Secondary | ICD-10-CM | POA: Insufficient documentation

## 2018-09-21 LAB — TSH: TSH: 1.655 u[IU]/mL (ref 0.350–4.500)

## 2018-09-21 LAB — VITAMIN B12: Vitamin B-12: 1052 pg/mL — ABNORMAL HIGH (ref 180–914)

## 2018-09-21 LAB — GLUCOSE, CAPILLARY
Glucose-Capillary: 122 mg/dL — ABNORMAL HIGH (ref 70–99)
Glucose-Capillary: 115 mg/dL — ABNORMAL HIGH (ref 70–99)
Glucose-Capillary: 109 mg/dL — ABNORMAL HIGH (ref 70–99)
Glucose-Capillary: 128 mg/dL — ABNORMAL HIGH (ref 70–99)
Glucose-Capillary: 143 mg/dL — ABNORMAL HIGH (ref 70–99)
Glucose-Capillary: 113 mg/dL — ABNORMAL HIGH (ref 70–99)

## 2018-09-21 LAB — LIPID PANEL
Cholesterol: 184 mg/dL (ref 0–200)
HDL: 42 mg/dL (ref >40)
LDL Cholesterol: 121 mg/dL — ABNORMAL HIGH (ref 0–99)
Total CHOL/HDL Ratio: 4.4 RATIO
Triglycerides: 107 mg/dL (ref <150)
VLDL: 21 mg/dL (ref 0–40)

## 2018-09-21 LAB — URINE CULTURE

## 2018-09-21 LAB — CK: Total CK: 2883 U/L — ABNORMAL HIGH (ref 38–234)

## 2018-09-21 LAB — HEMOGLOBIN A1C
Hgb A1c MFr Bld: 6.1 % — ABNORMAL HIGH (ref 4.8–5.6)
Mean Plasma Glucose: 128.37 mg/dL

## 2018-09-21 LAB — ECHOCARDIOGRAM COMPLETE
Height: 66 in
Weight: 2567.92 oz

## 2018-09-21 MED ORDER — ENOXAPARIN SODIUM 40 MG/0.4ML ~~LOC~~ SOLN
40.0000 mg | SUBCUTANEOUS | Status: DC
  Administered 2018-09-21 – 2018-09-22 (×2): 40 mg via SUBCUTANEOUS
  Filled 2018-09-21 (×2): qty 0.4

## 2018-09-21 MED ORDER — HYDROCORTISONE (PERIANAL) 2.5 % EX CREA: 1.0000 "application " | TOPICAL_CREAM | Freq: Four times a day (QID) | CUTANEOUS | Status: DC | PRN

## 2018-09-21 MED ORDER — METOPROLOL TARTRATE 5 MG/5ML IV SOLN
5.0000 mg | Freq: Once | INTRAVENOUS | Status: AC | PRN
  Administered 2018-09-21 (×2): 5 mg via INTRAVENOUS

## 2018-09-21 MED ORDER — DIGOXIN 0.25 MG/ML IJ SOLN
0.2500 mg | Freq: Every day | INTRAMUSCULAR | Status: DC
  Filled 2018-09-21: qty 1

## 2018-09-21 MED ORDER — DEXTROSE-NACL 5-0.45 % IV SOLN
INTRAVENOUS | Status: AC
  Administered 2018-09-21: 13:00:00 via INTRAVENOUS

## 2018-09-21 MED ORDER — IPRATROPIUM-ALBUTEROL 0.5-2.5 (3) MG/3ML IN SOLN: 3.0000 mL | RESPIRATORY_TRACT | Status: DC | PRN

## 2018-09-21 MED ORDER — SENNOSIDES-DOCUSATE SODIUM 8.6-50 MG PO TABS: 2.0000 | ORAL_TABLET | Freq: Every evening | ORAL | Status: DC | PRN

## 2018-09-21 MED ORDER — HYDROCORTISONE 1 % EX CREA: 1.0000 "application " | TOPICAL_CREAM | Freq: Three times a day (TID) | CUTANEOUS | Status: DC | PRN

## 2018-09-21 MED ORDER — GUAIFENESIN-DM 100-10 MG/5ML PO SYRP: 5.0000 mL | ORAL_SOLUTION | ORAL | Status: DC | PRN

## 2018-09-21 MED ORDER — POLYVINYL ALCOHOL 1.4 % OP SOLN: 1.0000 [drp] | OPHTHALMIC | Status: DC | PRN

## 2018-09-21 MED ORDER — ALUM & MAG HYDROXIDE-SIMETH 200-200-20 MG/5ML PO SUSP: 30.0000 mL | ORAL | Status: DC | PRN

## 2018-09-21 MED ORDER — MUSCLE RUB 10-15 % EX CREA: 1.0000 "application " | TOPICAL_CREAM | CUTANEOUS | Status: DC | PRN

## 2018-09-21 MED ORDER — METOPROLOL TARTRATE 5 MG/5ML IV SOLN
5.0000 mg | INTRAVENOUS | Status: DC | PRN
  Administered 2018-09-21 – 2018-09-23 (×10): 5 mg via INTRAVENOUS
  Filled 2018-09-21 (×11): qty 5

## 2018-09-21 MED ORDER — RESOURCE THICKENUP CLEAR PO POWD
ORAL | Status: DC | PRN
  Filled 2018-09-21: qty 125

## 2018-09-21 MED ORDER — SALINE SPRAY 0.65 % NA SOLN: 1.0000 | NASAL | Status: DC | PRN

## 2018-09-21 MED ORDER — DIGOXIN 0.25 MG/ML IJ SOLN
0.2500 mg | Freq: Four times a day (QID) | INTRAMUSCULAR | Status: AC
  Administered 2018-09-21: 0.25 mg via INTRAVENOUS
  Filled 2018-09-21: qty 1

## 2018-09-21 MED ORDER — POLYETHYLENE GLYCOL 3350 17 G PO PACK: 17.0000 g | PACK | Freq: Every day | ORAL | Status: DC | PRN

## 2018-09-21 MED ORDER — LIP MEDEX EX OINT: 1.0000 "application " | TOPICAL_OINTMENT | CUTANEOUS | Status: DC | PRN

## 2018-09-21 MED ORDER — PHENOL 1.4 % MT LIQD: 1.0000 | OROMUCOSAL | Status: DC | PRN

## 2018-09-21 MED ORDER — LORATADINE 10 MG PO TABS: 10.0000 mg | ORAL_TABLET | Freq: Every day | ORAL | Status: DC | PRN

## 2018-09-21 NOTE — Evaluation (Signed)
Clinical/Bedside Swallow Evaluation Patient Details  Name: Rachel Fox MRN: [id number] Date of Birth: September 27, 1945  Today's Date: 09/21/2018 Time: SLP Start Time (ACUTE ONLY): 4098 SLP Stop Time (ACUTE ONLY): 0846 SLP Time Calculation (min) (ACUTE ONLY): 18 min  Past Medical History:  Past Medical History:  Diagnosis Date  . High cholesterol   . Hypertension   . Type 2 diabetes mellitus ([place])    Past Surgical History:  Past Surgical History:  Procedure Laterality Date  . ABDOMINAL HYSTERECTOMY    . TUBAL LIGATION     HPI:  73 year old with history of atrial fibrillation, diabetes mellitus type 2, essential hypertension who was brought to the hospital for evaluation of fall.  She was found to be in mild rhabdomyolysis, atrial fibrillation with RVR and also a large right-sided MCA CVA on the CT of the head   Assessment / Plan / Recommendation Clinical Impression  Pt presents with a suspected oropharyngeal dysphagia with sensory and motor deficits post CVA. Moderate left sided oral motor deficits include left facial droop, reduced lingual, labial, and facial strength, ROM, and sensation. This allowed for left sided anterior spillage, left sided pocketing with solid and puree consistencies, and delayed oral transit. Pt with delayed cough folllowing thin liquids and frequent throat clearing with nectar thick liquids concerning for decreased airway protection. Recommend proceed with objective MBSS prior to diet intitiation. Pt okay for PO medicines in puree until completion of MBSS. SLP to follow up for MBSS.      SLP Visit Diagnosis: Dysphagia, unspecified     Aspiration Risk  Moderate aspiration risk    Diet Recommendation  NPO until completion of MBSS  Medication Administration: Whole meds with puree    Other  Recommendations Oral Care Recommendations: Oral care QID   Follow up Recommendations Inpatient Rehab      Frequency and Duration min 2x/week  2 weeks        Prognosis Prognosis for Safe Diet Advancement: Good Barriers to Reach Goals: Time post onset      Swallow Study   General Date of Onset: 09/20/18 HPI: 73 year old with history of atrial fibrillation, diabetes mellitus type 2, essential hypertension who was brought to the hospital for evaluation of fall.  She was found to be in mild rhabdomyolysis, atrial fibrillation with RVR and also a large right-sided MCA CVA on the CT of the head Type of Study: Bedside Swallow Evaluation Diet Prior to this Study: NPO Temperature Spikes Noted: No Respiratory Status: Room air History of Recent Intubation: No Behavior/Cognition: Alert;Cooperative;Pleasant mood Oral Cavity Assessment: Dry Oral Care Completed by SLP: Yes Oral Cavity - Dentition: Adequate natural dentition Vision: Functional for self-feeding Self-Feeding Abilities: Needs assist Patient Positioning: Upright in bed Baseline Vocal Quality: Low vocal intensity Volitional Cough: Weak Volitional Swallow: Able to elicit    Oral/Motor/Sensory Function Overall Oral Motor/Sensory Function: Moderate impairment Facial ROM: Reduced left;Suspected CN VII (facial) dysfunction Facial Symmetry: Abnormal symmetry left;Suspected CN VII (facial) dysfunction Facial Strength: Reduced left;Suspected CN VII (facial) dysfunction Facial Sensation: Reduced left;Suspected CN V (Trigeminal) dysfunction Lingual ROM: Reduced left;Suspected CN XII (hypoglossal) dysfunction Lingual Symmetry: Abnormal symmetry left;Suspected CN XII (hypoglossal) dysfunction Lingual Strength: Suspected CN XII (hypoglossal) dysfunction;Reduced   Ice Chips Ice chips: Impaired Presentation: Spoon Oral Phase Impairments: Reduced labial seal;Impaired mastication Oral Phase Functional Implications: Prolonged oral transit;Left anterior spillage Pharyngeal Phase Impairments: Suspected delayed Swallow;Multiple swallows   Thin Liquid Thin Liquid: Impaired Presentation: Cup Oral Phase  Impairments: Reduced labial seal Oral Phase Functional Implications:  Prolonged oral transit;Left anterior spillage Pharyngeal  Phase Impairments: Multiple swallows;Cough - Delayed;Suspected delayed Swallow    Nectar Thick Nectar Thick Liquid: Impaired Presentation: Cup Oral Phase Impairments: Reduced labial seal Oral phase functional implications: Left anterior spillage Pharyngeal Phase Impairments: Suspected delayed Swallow;Multiple swallows;Throat Clearing - Delayed;Throat Clearing - Immediate   Honey Thick Honey Thick Liquid: Not tested   Puree Puree: Impaired Presentation: Spoon Oral Phase Impairments: Reduced labial seal Oral Phase Functional Implications: Left anterior spillage;Left lateral sulci pocketing;Prolonged oral transit;Oral residue Pharyngeal Phase Impairments: Suspected delayed Swallow;Multiple swallows   Solid     Solid: Impaired Presentation: Self Fed Oral Phase Impairments: Impaired mastication;Reduced lingual movement/coordination Oral Phase Functional Implications: Left anterior spillage;Left lateral sulci pocketing;Prolonged oral transit;Impaired mastication;Oral residue Pharyngeal Phase Impairments: Suspected delayed Swallow;Multiple swallows      Akshay Spang E Kaison Mcparland MA, CCC-SLP  Acute Rehabilitation Services 09/21/2018,9:08 AM

## 2018-09-21 NOTE — Progress Notes (Signed)
STROKE TEAM PROGRESS NOTE   INTERVAL HISTORY I have personally obtained history of presenting illness by speaking with the patient and reviewing electronic medical records and imaging films in PACS.  Patient continues to have right gaze preference and left hemiplegia and remains in atrial fibrillation with rapid ventricular response.  Cardiology has seen the patient and plan to start digoxin as her low blood pressure is unlikely to tolerate Cardizem drip  Vitals:   09/21/18 0636 09/21/18 0805 09/21/18 1041 09/21/18 1145  BP:  118/63 (!) 115/52 125/84  Pulse:  82 99 100  Resp: 19 20 18 20   Temp:  98.2 F (36.8 C)  99.5 F (37.5 C)  TempSrc:  Oral  Oral  SpO2:  99% 99% 98%  Weight:      Height:        CBC:  Recent Labs  Lab 09/20/18 1130 09/20/18 1142  WBC  --  14.0*  NEUTROABS  --  11.5*  HGB 17.7* 15.5*  HCT 52.0* 49.2*  MCV  --  84.7  PLT  --  207    Basic Metabolic Panel:  Recent Labs  Lab 09/20/18 1130 09/20/18 1142  NA 149* 146*  K 4.4 3.8  CL 112* 107  CO2  --  23  GLUCOSE 128* 132*  BUN 34* 31*  CREATININE 1.00 1.04*  CALCIUM  --  10.4*  MG  --  2.3  PHOS  --  2.7   Lipid Panel:     Component Value Date/Time   CHOL 184 09/21/2018 1120   TRIG 107 09/21/2018 1120   HDL 42 09/21/2018 1120   CHOLHDL 4.4 09/21/2018 1120   VLDL 21 09/21/2018 1120   LDLCALC 121 (H) 09/21/2018 1120   HgbA1c:  Lab Results  Component Value Date   HGBA1C 6.1 (H) 09/21/2018   Urine Drug Screen:     Component Value Date/Time   LABOPIA NONE DETECTED 09/20/2018 1246   COCAINSCRNUR NONE DETECTED 09/20/2018 1246   LABBENZ NONE DETECTED 09/20/2018 1246   AMPHETMU NONE DETECTED 09/20/2018 1246   THCU NONE DETECTED 09/20/2018 1246   LABBARB NONE DETECTED 09/20/2018 1246    Alcohol Level     Component Value Date/Time   ETH <10 09/20/2018 1124    IMAGING Dg Chest 1 View  Result Date: 09/20/2018 CLINICAL DATA:  Left side weak this EXAM: CHEST  1 VIEW COMPARISON:   Portable exam 1149 hours compared to 02/18/2008 FINDINGS: Enlargement of cardiac silhouette. Mediastinal contours and pulmonary vascularity normal. Lungs clear. No infiltrate, pleural effusion or pneumothorax. Bones demineralized. IMPRESSION: Enlargement of cardiac silhouette. No acute abnormalities. Electronically Signed   By: Ulyses SouthwardMark  Boles M.D.   On: 09/20/2018 12:20   Ct Head Wo Contrast  Result Date: 09/20/2018 CLINICAL DATA:  Left-sided weakness, facial droop, and slurred speech. EXAM: CT HEAD WITHOUT CONTRAST CT CERVICAL SPINE WITHOUT CONTRAST TECHNIQUE: Multidetector CT imaging of the head and cervical spine was performed following the standard protocol without intravenous contrast. Multiplanar CT image reconstructions of the cervical spine were also generated. COMPARISON:  None. FINDINGS: CT HEAD FINDINGS Brain: A moderate-sized region of low density involving cortex and white matter in the right temporal, parietal, and frontal lobes and insula is consistent with a subacute infarct. There is also some right basal ganglia involvement. There is regional mass effect with slight effacement of the right lateral ventricle and 3 mm of leftward midline shift. No acute intracranial hemorrhage or extra-axial fluid collection is identified. They were is no age advanced  atrophy. Vascular: Calcified atherosclerosis at the skull base. Left cavernous sinus gas, possibly related to vena puncture. Hyperdense appearance of the right MCA in the sylvian fissure in the area of infarct. Skull: No fracture or focal osseous lesion. Sinuses/Orbits: Visualized paranasal sinuses and mastoid air cells are clear. Orbits are unremarkable. Other: None. CT CERVICAL SPINE FINDINGS Alignment: Straightening/slight reversal of the normal cervical lordosis. No significant listhesis. Skull base and vertebrae: No acute fracture or suspicious osseous lesion. Soft tissues and spinal canal: No prevertebral fluid or swelling. No visible canal  hematoma. Disc levels: Moderate cervical spondylosis. Multilevel neural foraminal stenosis due to uncovertebral spurring, moderate to severe bilaterally at C4-5. Upper chest: Clear lung apices. Other: None. IMPRESSION: 1. Moderately large subacute right MCA infarct with 3 mm of leftward midline shift. No hemorrhage. 2. No acute cervical spine fracture. Electronically Signed   By: Logan Bores M.D.   On: 09/20/2018 12:44   Ct Cervical Spine Wo Contrast  Result Date: 09/20/2018 CLINICAL DATA:  Left-sided weakness, facial droop, and slurred speech. EXAM: CT HEAD WITHOUT CONTRAST CT CERVICAL SPINE WITHOUT CONTRAST TECHNIQUE: Multidetector CT imaging of the head and cervical spine was performed following the standard protocol without intravenous contrast. Multiplanar CT image reconstructions of the cervical spine were also generated. COMPARISON:  None. FINDINGS: CT HEAD FINDINGS Brain: A moderate-sized region of low density involving cortex and white matter in the right temporal, parietal, and frontal lobes and insula is consistent with a subacute infarct. There is also some right basal ganglia involvement. There is regional mass effect with slight effacement of the right lateral ventricle and 3 mm of leftward midline shift. No acute intracranial hemorrhage or extra-axial fluid collection is identified. They were is no age advanced atrophy. Vascular: Calcified atherosclerosis at the skull base. Left cavernous sinus gas, possibly related to vena puncture. Hyperdense appearance of the right MCA in the sylvian fissure in the area of infarct. Skull: No fracture or focal osseous lesion. Sinuses/Orbits: Visualized paranasal sinuses and mastoid air cells are clear. Orbits are unremarkable. Other: None. CT CERVICAL SPINE FINDINGS Alignment: Straightening/slight reversal of the normal cervical lordosis. No significant listhesis. Skull base and vertebrae: No acute fracture or suspicious osseous lesion. Soft tissues and spinal  canal: No prevertebral fluid or swelling. No visible canal hematoma. Disc levels: Moderate cervical spondylosis. Multilevel neural foraminal stenosis due to uncovertebral spurring, moderate to severe bilaterally at C4-5. Upper chest: Clear lung apices. Other: None. IMPRESSION: 1. Moderately large subacute right MCA infarct with 3 mm of leftward midline shift. No hemorrhage. 2. No acute cervical spine fracture. Electronically Signed   By: Logan Bores M.D.   On: 09/20/2018 12:44   US Carotid Bilateral (at [facility] Only)  Result Date: 09/21/2018 CLINICAL DATA:  CVA.  History of hypertension and diabetes. EXAM: BILATERAL CAROTID DUPLEX ULTRASOUND TECHNIQUE: Pearline Cables scale imaging, color Doppler and duplex ultrasound were performed of bilateral carotid and vertebral arteries in the neck. COMPARISON:  None. FINDINGS: Criteria: Quantification of carotid stenosis is based on velocity parameters that correlate the residual internal carotid diameter with NASCET-based stenosis levels, using the diameter of the distal internal carotid lumen as the denominator for stenosis measurement. The following velocity measurements were obtained: RIGHT ICA: 35/7 cm/sec CCA: 16/1 cm/sec SYSTOLIC ICA/CCA RATIO:  0.5 ECA: 96 cm/sec LEFT ICA: 58/16 cm/sec CCA: 09/60 cm/sec SYSTOLIC ICA/CCA RATIO:  0.7 ECA: 52 cm/sec RIGHT CAROTID ARTERY: There is no grayscale evidence of significant intimal thickening or atherosclerotic plaque affecting the interrogated portions  of the right carotid system. There are no elevated peak systolic velocities within the interrogated course of the right internal carotid artery to suggest a hemodynamically significant stenosis. RIGHT VERTEBRAL ARTERY:  Antegrade flow LEFT CAROTID ARTERY: There is a minimal amount of eccentric echogenic plaque within the left carotid bulb (images 45 and 47), extending to involve the origin and proximal aspects of the left internal carotid artery (image 56), not resulting in elevated  peak systolic velocities within the interrogated course of the left internal carotid artery to suggest a hemodynamically significant stenosis. LEFT VERTEBRAL ARTERY:  Antegrade Flow Note is made of a apparent cardiac arrhythmia IMPRESSION: 1. Minimal amount of left-sided atherosclerotic plaque, not resulting in a hemodynamically significant stenosis. 2. Unremarkable sonographic evaluation of right carotid system. 3. Incidentally noted cardiac arrhythmia. Further evaluation with ECG monitoring could be performed as indicated Electronically Signed   By: Simonne Come M.D.   On: 09/21/2018 03:18   Dg Swallowing Func-speech Pathology  Result Date: 09/21/2018 Objective Swallowing Evaluation: Type of Study: MBS-Modified Barium Swallow Study  Patient Details Name: SANIYYA GAU MRN: [id number] Date of Birth: 1945/05/17 Today's Date: 09/21/2018 Time: SLP Start Time (ACUTE ONLY): 1020 -SLP Stop Time (ACUTE ONLY): 1039 SLP Time Calculation (min) (ACUTE ONLY): 19 min Past Medical History: Past Medical History: Diagnosis Date . High cholesterol  . Hypertension  . Type 2 diabetes mellitus (HCC)  Past Surgical History: Past Surgical History: Procedure Laterality Date . ABDOMINAL HYSTERECTOMY   . TUBAL LIGATION   HPI: 73 year old with history of atrial fibrillation, diabetes mellitus type 2, essential hypertension who was brought to the hospital for evaluation of fall.  She was found to be in mild rhabdomyolysis, atrial fibrillation with RVR and also a large right-sided MCA CVA on the CT of the head  Subjective: alert and asking for water Assessment / Plan / Recommendation CHL IP CLINICAL IMPRESSIONS 09/21/2018 Clinical Impression Pt presents with a mild to moderate dysphagia with primarily oral deficits. She has left-sided labial and lingual weakness that results in anterior and posterior spillage of boluses, but with improved containment and control with use of straw. Premature spillage of thin liquids is penetrated and silently  aspirated in small quantities. Penetration with nectar thick liquids is trace, shallow, and clears spontaneously either as she completes the swallow or with a reflexive throat clear. Nectar thick liquids did reach the true vocal cords x1 but was still ejected. This occurred in the setting of inability to fully clear soft solids from her oral cavity, with pt needing a liquid wash to assist. Her posterior oral transit was delayed with all solids but more efficient with purees. Recommend starting Dys 1 diet and nectar thick liquids with additional SLP f/u to maximize swallow function and safety. SLP Visit Diagnosis Dysphagia, oropharyngeal phase (R13.12) Attention and concentration deficit following -- Frontal lobe and executive function deficit following -- Impact on safety and function Mild aspiration risk   CHL IP TREATMENT RECOMMENDATION 09/21/2018 Treatment Recommendations Therapy as outlined in treatment plan below   Prognosis 09/21/2018 Prognosis for Safe Diet Advancement Good Barriers to Reach Goals -- Barriers/Prognosis Comment -- CHL IP DIET RECOMMENDATION 09/21/2018 SLP Diet Recommendations Dysphagia 1 (Puree) solids;Nectar thick liquid Liquid Administration via Straw Medication Administration Crushed with puree Compensations Slow rate;Small sips/bites;Monitor for anterior loss;Lingual sweep for clearance of pocketing Postural Changes Seated upright at 90 degrees   CHL IP OTHER RECOMMENDATIONS 09/21/2018 Recommended Consults -- Oral Care Recommendations Oral care BID Other Recommendations Order thickener from pharmacy;Prohibited  food (jello, ice cream, thin soups);Remove water pitcher   CHL IP FOLLOW UP RECOMMENDATIONS 09/21/2018 Follow up Recommendations Inpatient Rehab   CHL IP FREQUENCY AND DURATION 09/21/2018 Speech Therapy Frequency (ACUTE ONLY) min 2x/week Treatment Duration 2 weeks      CHL IP ORAL PHASE 09/21/2018 Oral Phase Impaired Oral - Pudding Teaspoon -- Oral - Pudding Cup -- Oral - Honey Teaspoon -- Oral -  Honey Cup -- Oral - Nectar Teaspoon -- Oral - Nectar Cup Premature spillage Oral - Nectar Straw Premature spillage Oral - Thin Teaspoon -- Oral - Thin Cup Premature spillage Oral - Thin Straw Premature spillage Oral - Puree Delayed oral transit Oral - Mech Soft Delayed oral transit;Impaired mastication Oral - Regular -- Oral - Multi-Consistency -- Oral - Pill -- Oral Phase - Comment --  CHL IP PHARYNGEAL PHASE 09/21/2018 Pharyngeal Phase Impaired Pharyngeal- Pudding Teaspoon -- Pharyngeal -- Pharyngeal- Pudding Cup -- Pharyngeal -- Pharyngeal- Honey Teaspoon -- Pharyngeal -- Pharyngeal- Honey Cup -- Pharyngeal -- Pharyngeal- Nectar Teaspoon -- Pharyngeal -- Pharyngeal- Nectar Cup Penetration/Aspiration before swallow Pharyngeal Material enters airway, remains ABOVE vocal cords then ejected out Pharyngeal- Nectar Straw Penetration/Aspiration before swallow Pharyngeal Material enters airway, CONTACTS cords and then ejected out Pharyngeal- Thin Teaspoon -- Pharyngeal -- Pharyngeal- Thin Cup Penetration/Aspiration before swallow Pharyngeal Material enters airway, passes BELOW cords without attempt by patient to eject out (silent aspiration) Pharyngeal- Thin Straw Penetration/Aspiration before swallow Pharyngeal Material enters airway, passes BELOW cords without attempt by patient to eject out (silent aspiration) Pharyngeal- Puree WFL Pharyngeal -- Pharyngeal- Mechanical Soft WFL Pharyngeal -- Pharyngeal- Regular -- Pharyngeal -- Pharyngeal- Multi-consistency -- Pharyngeal -- Pharyngeal- Pill -- Pharyngeal -- Pharyngeal Comment --  CHL IP CERVICAL ESOPHAGEAL PHASE 09/21/2018 Cervical Esophageal Phase WFL Pudding Teaspoon -- Pudding Cup -- Honey Teaspoon -- Honey Cup -- Nectar Teaspoon -- Nectar Cup -- Nectar Straw -- Thin Teaspoon -- Thin Cup -- Thin Straw -- Puree -- Mechanical Soft -- Regular -- Multi-consistency -- Pill -- Cervical Esophageal Comment -- Virl AxeLaura P Nix 09/21/2018, 11:23 AM  Ivar DrapeLaura Nix, M.A. CCC-SLP Acute  Rehabilitation Services Pager [phone] Office [phone]             2D echocardiogram  1. The left ventricle has hyperdynamic systolic function, with an ejection fraction of >65%. The cavity size was normal. There is mildly increased left ventricular wall thickness. Left ventricular diastolic function could not be evaluated secondary to  atrial fibrillation.  2. The right ventricle has normal systolic function. The cavity was normal. There is no increase in right ventricular wall thickness.  3. Trivial pericardial effusion is present.  4. No evidence of mitral valve stenosis.  5. No stenosis of the aortic valve.  6. The interatrial septum was not assessed.   PHYSICAL EXAM Obese elderly African-American lady lying comfortably in bed. . Afebrile. Head is nontraumatic. Neck is supple without bruit.    Cardiac exam no murmur or gallop. Lungs are clear to auscultation. Distal pulses are well felt. Neurological Exam :  Awake alert oriented to time and place.  Diminished attention registration and recall.  Dysarthria.  Right gaze preference.  Unable to look to the left past midline.  Left homonymous hemianopsia and does not blink to threat on the left.  Moderate left lower facial weakness.  Tongue midline.  Dense left hemiplegia with 0/5 strength.  Purposeful antigravity movement on the right with good strength.  Sensation is diminished on the left compared to the right.  Left plantar is  upgoing right is downgoing.  Gait not tested. ASSESSMENT/PLAN Ms. Laverda PageBarbara C Errington is a 73 y.o. female with history of HTN, HLD, type II DVT found down on her floor x2 days.  Found to have a large right brain stroke and also in new A. fib with RVR.   Stroke:   Large R MCA infarct embolic secondary to new onset A. fib   CT head moderately large R MCA infarct with 3 mm left shift  CT CS no fracture  TCD pending   Carotid Doppler no significant stenosis.  Incidentally noted cardiac arrhythmia.  2D Echo EF  >65%.  In atrial fibrillation.  LDL 121  HgbA1c 6.1  Add Lovenox 40 subcu daily for VTE prophylaxis  No antithrombotic prior to admission, now on aspirin 325 mg daily.  Continue aspirin for now.  Will need anticoagulation  In 3-5 days  Therapy recommendations: CIR  Disposition: Pending  Atrial Fibrillation w/ RVR, new onset  Home anticoagulation:  None   On Cardizem drip, will change to digoxin to avoid low blood pressure . Agree with plans for anticoagulation, consider in 5 to 7 days    Hypertension/hypotension  Stable 110s . Permissive hypertension (OK if < 220/120) but gradually normalize in 5-7 days . Long-term BP goal normotensive  Hyperlipidemia  Home meds: No statin  LDL 121, goal < 70  Add Lipitor 80 once LFTs stablize  Continue statin at discharge  Diabetes type II Controlled  HgbA1c 6.1, goal < 7.0  Dysphagia . Secondary to stroke . Cleared for dysphagia 1 nectar thick liquid diet after modified barium . Speech on board   Other Stroke Risk Factors  Advanced age  Former cigarette smoker  Family hx stroke (mother)  Other Active Problems  Rhabdomyolysis, secondary to fall immediately for x2 days  Hyperbilirubinemia  Leukocytosis  Hypernatremia  Hospital day # 1 I have personally obtained history,examined this patient, reviewed notes, independently viewed imaging studies, participated in medical decision making and plan of care.ROS completed by me personally and pertinent positives fully documented  I have made any additions or clarifications directly to the above note.  She has presented with a large right MCA infarct secondary to new onset atrial fibrillation and cardiogenic embolism.  She will need anticoagulation with Eliquis but after 3 to 5 days to reduce risk for hemorrhagic transformation.  Cardiology consult to help control heart rate.  Physical occupational therapy speech therapy consults.  Discussed with Dr. Nelson ChimesAmin.  Greater than 50%  time during this 35-minute visit was spent on counseling and coordination of care about her embolic stroke and discussion about plan of care and answering questions.  Delia HeadyPramod Elishua Radford, MD Medical Director [facility] Pager: [phone] 09/21/2018 4:10 PM   To contact Stroke Continuity provider, please refer to [email or web address]. After hours, contact General Neurology

## 2018-09-21 NOTE — Progress Notes (Signed)
  Echocardiogram 2D Echocardiogram has been performed.  Rachel Fox 09/21/2018, 10:01 AM

## 2018-09-21 NOTE — Evaluation (Addendum)
Occupational Therapy Evaluation Patient Details Name: Rachel Fox MRN: [id number] DOB: 1945-11-15 Today's Date: 09/21/2018    History of Present Illness Pt is a 73 y/o female with a PMH significant for HTN, DM. She presents after being found on the floor after ~2 days. CT revealed moderate to large R-sided subacute MCA infarct. Pt was also found to be in a-fib with RVR and with mild rhabdomyolitis.    Clinical Impression   This 73 y/o female presents with the above. PTA pt reports she is independent with ADL,iADL and functional mobility. Session limited today as pt with elevated HR with activity at bed level (noted up to 150s). Pt does present with significant L side weakness, sensation and question L side inattention. She is able to perform self-feeding with setup/supervision assist, simple grooming ADL with minguard assist. Pt with delayed responses and decreased awareness of current deficits. She will benefit from continued OT services including post acute rehab services to maximize her safety and independence with ADL and mobility. Will follow.     Follow Up Recommendations  SNF;Supervision/Assistance - 24 hour(pending progress - may could go CIR)    Equipment Recommendations  Other (comment)(TBD in next venue)           Precautions / Restrictions Precautions Precautions: Fall Precaution Comments: L side weak Restrictions Weight Bearing Restrictions: No      Mobility Bed Mobility Overal bed mobility: Needs Assistance             General bed mobility comments: Bed mobility limited by elevated HR this session. Will likely need +2 assist.   Transfers Overall transfer level: Needs assistance               General transfer comment: OOB mobility limited by elevated HR    Balance                                           ADL either performed or assessed with clinical judgement   ADL Overall ADL's : Needs assistance/impaired Eating/Feeding:  Supervision/ safety;Set up Eating/Feeding Details (indicate cue type and reason): full supervision for sipping nectar thick liquids today, cues for small sips Grooming: Wash/dry hands;Set up;Min guard;Bed level   Upper Body Bathing: Moderate assistance;Bed level       Upper Body Dressing : Moderate assistance;Maximal assistance;Bed level                     General ADL Comments: session limited as pt with elevated HR with activity bed level     Vision Baseline Vision/History: Wears glasses Wears Glasses: At all times Patient Visual Report: No change from baseline Vision Assessment?: Yes Eye Alignment: Within Functional Limits Ocular Range of Motion: Within Functional Limits Alignment/Gaze Preference: Within Defined Limits Tracking/Visual Pursuits: Able to track stimulus in all quads without difficulty Visual Fields: (to be further assessed) Additional Comments: question possible L inattention/neglect - will continue to assess     Perception     Praxis      Pertinent Vitals/Pain Pain Assessment: Faces Faces Pain Scale: Hurts a little bit Pain Location: Low back Pain Descriptors / Indicators: Discomfort Pain Intervention(s): Monitored during session;Limited activity within patient's tolerance     Hand Dominance Right   Extremity/Trunk Assessment Upper Extremity Assessment Upper Extremity Assessment: LUE deficits/detail LUE Deficits / Details: grossly 1/5 throughout, trace movement noted but not on command, decreased  sensation LUE Sensation: decreased light touch LUE Coordination: decreased fine motor;decreased gross motor   Lower Extremity Assessment Lower Extremity Assessment: Defer to PT evaluation       Communication Communication Communication: Other (comment)(some dysarthric speech)   Cognition Arousal/Alertness: Awake/alert Behavior During Therapy: Flat affect Overall Cognitive Status: Impaired/Different from baseline Area of Impairment:  Attention;Following commands;Awareness;Problem solving;Safety/judgement                   Current Attention Level: Sustained   Following Commands: Follows one step commands with increased time Safety/Judgement: Decreased awareness of deficits Awareness: Intellectual Problem Solving: Slow processing;Decreased initiation;Requires verbal cues;Requires tactile cues General Comments: pt reports she "feels fine" and reports feeling close to her baseline strengthwise despite significant L side weakness    General Comments  fluctating HR with actitivity at bed level, up to 150s (RN aware)    Exercises     Shoulder Instructions      Home Living Family/patient expects to be discharged to:: Private residence Living Arrangements: Alone Available Help at Discharge: Family;Available PRN/intermittently Type of Home: House Home Access: Stairs to enter Entergy CorporationEntrance Stairs-Number of Steps: 8 in the front, 3 in the back Entrance Stairs-Rails: None(in the back where pt typically goes in. Rail in the front) Home Layout: Two level Alternate Level Stairs-Number of Steps: 1 to the bedroom   Bathroom Shower/Tub: Chief Strategy OfficerTub/shower unit   Bathroom Toilet: Standard     Home Equipment: None          Prior Functioning/Environment Level of Independence: Independent        Comments: performing iADL including driving        OT Problem List: Decreased strength;Decreased range of motion;Decreased activity tolerance;Impaired balance (sitting and/or standing);Decreased coordination;Decreased cognition;Decreased safety awareness;Decreased knowledge of use of DME or AE;Decreased knowledge of precautions;Impaired UE functional use;Impaired sensation;Impaired vision/perception      OT Treatment/Interventions: Self-care/ADL training;Therapeutic exercise;Neuromuscular education;DME and/or AE instruction;Therapeutic activities;Patient/family education;Balance training;Cognitive  remediation/compensation;Visual/perceptual remediation/compensation    OT Goals(Current goals can be found in the care plan section) Acute Rehab OT Goals Patient Stated Goal: wants food and water OT Goal Formulation: With patient Time For Goal Achievement: 10/05/18 Potential to Achieve Goals: Good  OT Frequency: Min 2X/week   Barriers to D/C:            Co-evaluation PT/OT/SLP Co-Evaluation/Treatment: Yes Reason for Co-Treatment: Complexity of the patient's impairments (multi-system involvement);For patient/therapist safety;To address functional/ADL transfers   OT goals addressed during session: ADL's and self-care;Strengthening/ROM      AM-PAC OT "6 Clicks" Daily Activity     Outcome Measure Help from another person eating meals?: A Lot Help from another person taking care of personal grooming?: A Lot Help from another person toileting, which includes using toliet, bedpan, or urinal?: Total Help from another person bathing (including washing, rinsing, drying)?: A Lot Help from another person to put on and taking off regular upper body clothing?: A Lot Help from another person to put on and taking off regular lower body clothing?: Total 6 Click Score: 10   End of Session Nurse Communication: Mobility status  Activity Tolerance: Patient tolerated treatment well;Treatment limited secondary to medical complications (Comment)(elevated HR) Patient left: in bed;with call bell/phone within reach;with bed alarm set  OT Visit Diagnosis: Muscle weakness (generalized) (M62.81);Hemiplegia and hemiparesis Hemiplegia - Right/Left: Left Hemiplegia - dominant/non-dominant: Non-Dominant Hemiplegia - caused by: Cerebral infarction                Time: [id number] OT Time Calculation (min): 28  min Charges:  OT General Charges $OT Visit: 1 Visit OT Evaluation $OT Eval Moderate Complexity: 1 Mod  Rachel Fox, OT Cable[employer] SystemsSupplemental Rehabilitation Services Pager [phone] Office  [phone]   Orlando PennerBreanna L Aybree Fox 09/21/2018, 1:38 PM

## 2018-09-21 NOTE — Progress Notes (Signed)
  Speech Language Pathology Treatment: Cognitive-Linquistic  Patient Details Name: Rachel Fox MRN: [id number] DOB: 02-Jul-1945 Today's Date: 09/21/2018 Time: 4098-1191 SLP Time Calculation (min) (ACUTE ONLY): 27 min  Assessment / Plan / Recommendation Clinical Impression  Pt was seen for cognitive-liguistic treatment session and was cooperative throughout the session but reported that her speech is not dysarthric but that it sounds like there is a "frog in her throat". She was able to recall some of the events from the day but none of the recommendations provided by speech pathology. Per the pt, she is not having any cognitive-linguistic deficits. She achieved 80% accuracy with time management problems increasing to 100% with min cues. She responded to problems related to safety with 100% accuracy. She completed a medication management (prescription) task with 50% accuracy increasing to 83% accuracy with mod cues. She achieved 80% accuracy with recall of objective information from voice mails and completed a mental manipulation sequencing task with 100% accuracy. SLP will continue to follow pt.    HPI HPI: 73 year old with history of atrial fibrillation, diabetes mellitus type 2, essential hypertension who was brought to the hospital for evaluation of fall.  She was found to be in mild rhabdomyolysis, atrial fibrillation with RVR and also a large right-sided MCA CVA on the CT of the head      SLP Plan  Continue with current plan of care       Recommendations                  Follow up Recommendations: Skilled Nursing facility;24 hour supervision/assistance SLP Visit Diagnosis: Dysphagia, oropharyngeal phase (R13.12) Plan: Continue with current plan of care       Sharlette Jansma I. Hardin Negus, [place], [place] Office number [phone] Pager [place] 09/21/2018, 5:32 PM

## 2018-09-21 NOTE — Evaluation (Signed)
Physical Therapy Evaluation Patient Details Name: Rachel PageBarbara C Fox MRN: [id number] DOB: 05-Dec-1945 Today's Date: 09/21/2018   History of Present Illness  Pt is a 73 y/o female with a PMH significant for HTN, DM. She presents after being found on the floor after ~2 days. CT revealed moderate to large R-sided subacute MCA infarct. Pt was also found to be in a-fib with RVR and with mild rhabdomyolitis.   Clinical Impression  Pt admitted with above diagnosis. Pt currently with functional limitations due to the deficits listed below (see PT Problem List). At the time of PT eval pt's HR was elevated and fluctuating between 135-160's. PA present and asking that pt not mobilize today 2 HR. RN notified as well. Pt with L side hemiplegia, and will likely require heavy +2 assist for all mobility next session. Would like to consider CIR level follow-up therapies at d/c if pt can demonstrate tolerance for functional activity and a reasonable therapeutic effort. Will continue to assess for most appropriate d/c disposition in following sessions. Acutely, pt will benefit from skilled PT to increase their independence and safety with mobility to allow discharge to the venue listed below.       Follow Up Recommendations SNF;Supervision/Assistance - 24 hour    Equipment Recommendations  Other (comment)(TBD by next venue of care)    Recommendations for Other Services       Precautions / Restrictions Precautions Precautions: Fall Precaution Comments: L side weak/flaccid Restrictions Weight Bearing Restrictions: No      Mobility  Bed Mobility Overal bed mobility: Needs Assistance             General bed mobility comments: Bed mobility limited by elevated HR this session. Will need +2 assist.   Transfers Overall transfer level: Needs assistance               General transfer comment: OOB mobility limited by elevated HR. Will need +2 assist for any OOB and possibly a lift pad in chair for back  to bed with nursing after next therapy session.   Ambulation/Gait                Stairs            Wheelchair Mobility    Modified Rankin (Stroke Patients Only) Modified Rankin (Stroke Patients Only) Pre-Morbid Rankin Score: No symptoms Modified Rankin: Severe disability     Balance       Sitting balance - Comments: Unable to assess balance this session                                     Pertinent Vitals/Pain Pain Assessment: Faces Faces Pain Scale: Hurts a little bit Pain Location: Low back Pain Descriptors / Indicators: Discomfort Pain Intervention(s): Monitored during session    Home Living Family/patient expects to be discharged to:: Private residence Living Arrangements: Alone Available Help at Discharge: Family;Available PRN/intermittently Type of Home: House Home Access: Stairs to enter Entrance Stairs-Rails: None(in the back where pt typically goes in. Rail in the front) Secretary/administratorntrance Stairs-Number of Steps: 8 in the front, 3 in the back Home Layout: Two level Home Equipment: None      Prior Function Level of Independence: Independent         Comments: performing iADL including driving     Hand Dominance   Dominant Hand: Right    Extremity/Trunk Assessment   Upper Extremity Assessment Upper Extremity  Assessment: Defer to OT evaluation;LUE deficits/detail LUE Deficits / Details: grossly 1/5 throughout, trace movement noted but not on command, decreased sensation LUE Sensation: decreased light touch LUE Coordination: decreased fine motor;decreased gross motor    Lower Extremity Assessment Lower Extremity Assessment: LLE deficits/detail;RLE deficits/detail RLE Deficits / Details: Effortful to engage in AROM activity in bed LLE Deficits / Details: No active movement noted. Pt unable to wiggle toes, perform ankle AROM, or initiate any muscle activation in quads. Pt reports equal sensation in R and L LE's.         Communication   Communication: Other (comment)(some dysarthric speech)  Cognition Arousal/Alertness: Awake/alert Behavior During Therapy: Flat affect Overall Cognitive Status: Impaired/Different from baseline Area of Impairment: Attention;Following commands;Awareness;Problem solving;Safety/judgement                   Current Attention Level: Sustained   Following Commands: Follows one step commands with increased time Safety/Judgement: Decreased awareness of deficits Awareness: Intellectual Problem Solving: Slow processing;Decreased initiation;Requires verbal cues;Requires tactile cues General Comments: pt reports she "feels fine" and reports feeling close to her baseline strengthwise despite significant L side weakness       General Comments General comments (skin integrity, edema, etc.): fluctating HR with actitivity at bed level, up to 160s (RN aware)    Exercises     Assessment/Plan    PT Assessment Patient needs continued PT services  PT Problem List Decreased strength;Decreased range of motion;Decreased activity tolerance;Decreased balance;Decreased mobility;Decreased coordination;Decreased cognition;Decreased knowledge of use of DME;Decreased safety awareness;Decreased knowledge of precautions;Cardiopulmonary status limiting activity;Impaired sensation;Impaired tone;Pain       PT Treatment Interventions DME instruction;Gait training;Functional mobility training;Therapeutic activities;Therapeutic exercise;Stair training;Balance training;Neuromuscular re-education;Cognitive remediation;Patient/family education;Wheelchair mobility training    PT Goals (Current goals can be found in the Care Plan section)  Acute Rehab PT Goals Patient Stated Goal: wants food and water PT Goal Formulation: With patient Time For Goal Achievement: 10/05/18 Potential to Achieve Goals: Good    Frequency Min 4X/week   Barriers to discharge        Co-evaluation PT/OT/SLP  Co-Evaluation/Treatment: Yes Reason for Co-Treatment: Complexity of the patient's impairments (multi-system involvement);Necessary to address cognition/behavior during functional activity;To address functional/ADL transfers;For patient/therapist safety PT goals addressed during session: Strengthening/ROM OT goals addressed during session: ADL's and self-care;Strengthening/ROM       AM-PAC PT "6 Clicks" Mobility  Outcome Measure Help needed turning from your back to your side while in a flat bed without using bedrails?: Total Help needed moving from lying on your back to sitting on the side of a flat bed without using bedrails?: Total Help needed moving to and from a bed to a chair (including a wheelchair)?: Total Help needed standing up from a chair using your arms (e.g., wheelchair or bedside chair)?: Total Help needed to walk in hospital room?: Total Help needed climbing 3-5 steps with a railing? : Total 6 Click Score: 6    End of Session   Activity Tolerance: Patient limited by fatigue;Treatment limited secondary to medical complications (Comment)(Tachycardic) Patient left: in bed;with call bell/phone within reach;with bed alarm set Nurse Communication: Other (comment)(Per PA, pt not to be OOB with therapy this session) PT Visit Diagnosis: Other symptoms and signs involving the nervous system (R29.898);Hemiplegia and hemiparesis Hemiplegia - Right/Left: Left Hemiplegia - dominant/non-dominant: Non-dominant Hemiplegia - caused by: Cerebral infarction    Time: [id number] PT Time Calculation (min) (ACUTE ONLY): 30 min   Charges:   PT Evaluation $PT Eval Moderate Complexity: 1 Mod  Rolinda Roan, PT, DPT Acute Rehabilitation Services Pager: [phone] Office: [phone]   Thelma Comp 09/21/2018, 2:21 PM

## 2018-09-21 NOTE — Evaluation (Signed)
Speech Language Pathology Evaluation Patient Details Name: Rachel Fox MRN: [id number] DOB: 1945-11-06 Today's Date: 09/21/2018 Time: 9518-8416 SLP Time Calculation (min) (ACUTE ONLY): 20 min  Problem List:  Patient Active Problem List   Diagnosis Date Noted  . Pressure injury of skin 09/21/2018  . Cerebrovascular accident (CVA) with involvement of left side of body ([place]) 09/20/2018  . Atrial fibrillation with RVR ([place]) 09/20/2018  . Hypertension   . Rhabdomyolysis   . Type 2 diabetes mellitus ([place])   . Hyperlipidemia   . Hyperbilirubinemia   . Leukocytosis   . Hypernatremia    Past Medical History:  Past Medical History:  Diagnosis Date  . High cholesterol   . Hypertension   . Type 2 diabetes mellitus ([place])    Past Surgical History:  Past Surgical History:  Procedure Laterality Date  . ABDOMINAL HYSTERECTOMY    . TUBAL LIGATION     HPI:  73 year old with history of atrial fibrillation, diabetes mellitus type 2, essential hypertension who was brought to the hospital for evaluation of fall.  She was found to be in mild rhabdomyolysis, atrial fibrillation with RVR and also a large right-sided MCA CVA on the CT of the head   Assessment / Plan / Recommendation Clinical Impression  Pt presents with dysarthria of speech and cognitive deficits s/p large right MCA CVA. Receptive and expressive language appears intact. Dysarthria c/b reduced articulation and reduced breath support impairing vocal intensity and allowing for decreased intellibility during communication interactions. Novel cognitive deficits include left sided neglect, reduced executive function skills including self monitoring. Pt with mildly reduced recall of novel information. PLOF pt reports living alone and was independent with all ADLs. SLP to follow up for cognitive linguistic deficits.     SLP Assessment  SLP Recommendation/Assessment: Patient needs continued Speech Lanaguage Pathology Services SLP Visit  Diagnosis: Cognitive communication deficit (R41.841);Dysarthria and anarthria (R47.1)    Follow Up Recommendations  Inpatient Rehab    Frequency and Duration min 2x/week         SLP Evaluation Cognition  Overall Cognitive Status: Impaired/Different from baseline Arousal/Alertness: Awake/alert Orientation Level: Oriented X4 Attention: Sustained Sustained Attention: Impaired Sustained Attention Impairment: Verbal complex;Functional complex Memory: Impaired Memory Impairment: Decreased recall of new information Awareness: Impaired Problem Solving: Impaired Executive Function: Self Monitoring;Sequencing Sequencing: Impaired Self Monitoring: Impaired Safety/Judgment: Impaired       Comprehension  Auditory Comprehension Overall Auditory Comprehension: Appears within functional limits for tasks assessed    Expression Expression Primary Mode of Expression: Verbal Verbal Expression Overall Verbal Expression: Appears within functional limits for tasks assessed Written Expression Dominant Hand: Right   Oral / Motor  Oral Motor/Sensory Function Overall Oral Motor/Sensory Function: Moderate impairment Facial ROM: Reduced left;Suspected CN VII (facial) dysfunction Facial Symmetry: Abnormal symmetry left;Suspected CN VII (facial) dysfunction Facial Strength: Reduced left;Suspected CN VII (facial) dysfunction Facial Sensation: Reduced left;Suspected CN V (Trigeminal) dysfunction Lingual ROM: Reduced left;Suspected CN XII (hypoglossal) dysfunction Lingual Symmetry: Abnormal symmetry left;Suspected CN XII (hypoglossal) dysfunction Lingual Strength: Suspected CN XII (hypoglossal) dysfunction;Reduced Motor Speech Overall Motor Speech: Impaired Respiration: Impaired Level of Impairment: Sentence Phonation: Normal Articulation: Impaired Level of Impairment: Word Intelligibility: Intelligibility reduced Motor Planning: Witnin functional limits Effective Techniques: Slow  rate;Increased vocal intensity;Over-articulate;Pause   GO                    Avonell Lenig E Sayra Frisby MA, CCC-SLP Acute Rehabilitation Services 09/21/2018, 9:16 AM

## 2018-09-21 NOTE — Progress Notes (Signed)
PROGRESS NOTE    SHADAI MCCLANE  RXV:[id number] DOB: 10-13-1945 DOA: 09/20/2018 PCP: Celene Squibb, MD   Brief Narrative:  73 year old with history of atrial fibrillation, diabetes mellitus type 2, essential hypertension who was brought to the hospital for evaluation of fall.  She was found to be in mild rhabdomyolysis, atrial fibrillation with RVR and also a large right-sided CVA on the CT of the head.  Neurology consulted.   Assessment & Plan:   Principal Problem:   Cerebrovascular accident (CVA) with involvement of left side of body ([place]) Active Problems:   Atrial fibrillation with RVR ([place])   Hypertension   Rhabdomyolysis   Type 2 diabetes mellitus (H[facility])   Hyperlipidemia   Hyperbilirubinemia   Leukocytosis   Hypernatremia  Moderate to large right-sided acute MCA stroke -Stroke protocol -Allow for permissive hypertension for the first 24-48h - only treat PRN if SBP >220 mmHg. Blood pressures can be gradually normalized to SBP<140 upon discharge. -MRI brain without contrast and MRA head and neck without contrast (given deranged renal function) -Maintain [place].  -ASA ordered.  -Echocardiogram -Lipid Panel, TSH, B12 and folate and A1C -Frequent neuro checks -Atorvastatin. -Risk factor modification -Neurology team following -PT/OT eval, Speech consult -Hold off on full dose of anticoagulation for at least 3-5 days for atrial fibrillation. - Stop IV fluids with potassium, will use half-normal saline.  Atrial fibrillation with RVR, HR in 140s - Rate controlled.  Cardiology team consulted for their assistance.  Only aspirin for now, no anticoagulation due to large stroke until cleared by neurology.  Mild rhabdomyolysis -Gentle hydration.  Hypernatremia -Likely from dehydration.  Continue to follow sodium levels.  Diabetes mellitus type 2 - Metformin on hold.  Accu-Chek sliding scale  Essential hypertension -Micardis and amlodipine on hold due to permissive  hypertension.   DVT prophylaxis: SCDs Code Status: Full code none Family Communication: None at bedside Disposition Plan: Maintain hospital stay for further evaluation and complete work-up for her stroke.  Consultants:   Cardiology  Neurology  Procedures:   None  Antimicrobials:   None   Subjective: Tells me she wants to eat this morning and unable to tell me if she is always had left-sided facial deficit.  Overall poor historian.  But no complaints otherwise.  Review of Systems Otherwise negative except as per HPI, including: General: Denies fever, chills, night sweats or unintended weight loss. Resp: Denies cough, wheezing, shortness of breath. Cardiac: Denies chest pain, palpitations, orthopnea, paroxysmal nocturnal dyspnea. GI: Denies abdominal pain, nausea, vomiting, diarrhea or constipation GU: Denies dysuria, frequency, hesitancy or incontinence MS: Denies muscle aches, joint pain or swelling Neuro: Denies headache, abnormal gait Psych: Denies anxiety, depression, SI/HI/AVH Skin: Denies new rashes or lesions ID: Denies sick contacts, exotic exposures, travel  Objective: Vitals:   09/21/18 0523 09/21/18 0524 09/21/18 0612 09/21/18 0636  BP:      Pulse:   96   Resp: 18 17 19 19   Temp:      TempSrc:      SpO2:      Weight:      Height:        Intake/Output Summary (Last 24 hours) at 09/21/2018 0803 Last data filed at 09/21/2018 0419 Gross per 24 hour  Intake 2252.99 ml  Output 450 ml  Net 1802.99 ml   Filed Weights   09/20/18 1100 09/20/18 1942  Weight: 70.8 kg 72.8 kg    Examination:  General exam: Appears calm and comfortable  Respiratory system: Clear to auscultation.  Respiratory effort normal. Cardiovascular system: S1 & S2 heard, RRR. No JVD, murmurs, rubs, gallops or clicks. No pedal edema. Gastrointestinal system: Abdomen is nondistended, soft and nontender. No organomegaly or masses felt. Normal bowel sounds heard. Central nervous system:  Alert and oriented.  Left-sided facial asymmetry and weakness.  Strength is 4/5 on the right side, left upper and lower extremity strength is 2/5.Marland Kitchen. Extremities: Symmetric 5 x 5 power. Skin: No rashes, lesions or ulcers Psychiatry: Judgement and insight appear normal. Mood & affect appropriate.     Data Reviewed:   CBC: Recent Labs  Lab 09/20/18 1130 09/20/18 1142  WBC  --  14.0*  NEUTROABS  --  11.5*  HGB 17.7* 15.5*  HCT 52.0* 49.2*  MCV  --  84.7  PLT  --  207   Basic Metabolic Panel: Recent Labs  Lab 09/20/18 1130 09/20/18 1142  NA 149* 146*  K 4.4 3.8  CL 112* 107  CO2  --  23  GLUCOSE 128* 132*  BUN 34* 31*  CREATININE 1.00 1.04*  CALCIUM  --  10.4*  MG  --  2.3  PHOS  --  2.7   GFR: Estimated Creatinine Clearance: 50.7 mL/min (A) (by C-G formula based on SCr of 1.04 mg/dL (H)). Liver Function Tests: Recent Labs  Lab 09/20/18 1142  AST 63*  ALT 30  ALKPHOS 61  BILITOT 1.3*  PROT 8.5*  ALBUMIN 4.2   No results for input(s): LIPASE, AMYLASE in the last 168 hours. No results for input(s): AMMONIA in the last 168 hours. Coagulation Profile: Recent Labs  Lab 09/20/18 1142  INR 1.1   Cardiac Enzymes: Recent Labs  Lab 09/20/18 1142 09/21/18 0525  CKTOTAL 2,651* 2,883*   BNP (last 3 results) No results for input(s): PROBNP in the last 8760 hours. HbA1C: No results for input(s): HGBA1C in the last 72 hours. CBG: Recent Labs  Lab 09/20/18 2343 09/21/18 0800  GLUCAP 104* 109*   Lipid Profile: No results for input(s): CHOL, HDL, LDLCALC, TRIG, CHOLHDL, LDLDIRECT in the last 72 hours. Thyroid Function Tests: No results for input(s): TSH, T4TOTAL, FREET4, T3FREE, THYROIDAB in the last 72 hours. Anemia Panel: No results for input(s): VITAMINB12, FOLATE, FERRITIN, TIBC, IRON, RETICCTPCT in the last 72 hours. Sepsis Labs: No results for input(s): PROCALCITON, LATICACIDVEN in the last 168 hours.  Recent Results (from the past 240 hour(s))  SARS  Coronavirus 2 (CEPHEID - Performed in [facility] lab), Hosp Order     Status: None   Collection Time: 09/20/18 11:10 AM   Specimen: Nasopharyngeal Swab  Result Value Ref Range Status   SARS Coronavirus 2 NEGATIVE NEGATIVE Final    Comment: (NOTE) If result is NEGATIVE SARS-CoV-2 target nucleic acids are NOT DETECTED. The SARS-CoV-2 RNA is generally detectable in upper and lower  respiratory specimens during the acute phase of infection. The lowest  concentration of SARS-CoV-2 viral copies this assay can detect is 250  copies / mL. A negative result does not preclude SARS-CoV-2 infection  and should not be used as the sole basis for treatment or other  patient management decisions.  A negative result may occur with  improper specimen collection / handling, submission of specimen other  than nasopharyngeal swab, presence of viral mutation(s) within the  areas targeted by this assay, and inadequate number of viral copies  (<250 copies / mL). A negative result must be combined with clinical  observations, patient history, and epidemiological information. If result is POSITIVE SARS-CoV-2 target nucleic  acids are DETECTED. The SARS-CoV-2 RNA is generally detectable in upper and lower  respiratory specimens dur ing the acute phase of infection.  Positive  results are indicative of active infection with SARS-CoV-2.  Clinical  correlation with patient history and other diagnostic information is  necessary to determine patient infection status.  Positive results do  not rule out bacterial infection or co-infection with other viruses. If result is PRESUMPTIVE POSTIVE SARS-CoV-2 nucleic acids MAY BE PRESENT.   A presumptive positive result was obtained on the submitted specimen  and confirmed on repeat testing.  While 2019 novel coronavirus  (SARS-CoV-2) nucleic acids may be present in the submitted sample  additional confirmatory testing may be necessary for epidemiological  and / or  clinical management purposes  to differentiate between  SARS-CoV-2 and other Sarbecovirus currently known to infect humans.  If clinically indicated additional testing with an alternate test  methodology [phone]([id number]) is advised. The SARS-CoV-2 RNA is generally  detectable in upper and lower respiratory sp ecimens during the acute  phase of infection. The expected result is Negative. Fact Sheet for Patients:  BoilerBrush.com.cyhttps://www.fda.gov/media/136312/download Fact Sheet for Healthcare Providers: https://pope.com/https://www.fda.gov/media/136313/download This test is not yet approved or cleared by the [place] FDA and has been authorized for detection and/or diagnosis of SARS-CoV-2 by FDA under an Emergency Use Authorization (EUA).  This EUA will remain in effect (meaning this test can be used) for the duration of the COVID-19 declaration under Section 564(b)(1) of the Act, 21 U.S.C. section 360bbb-3(b)(1), unless the authorization is terminated or revoked sooner. Performed at [facility], [address] [place], [place] [address]          Radiology Studies: Dg Chest 1 View  Result Date: 09/20/2018 CLINICAL DATA:  Left side weak this EXAM: CHEST  1 VIEW COMPARISON:  Portable exam 1149 hours compared to 02/18/2008 FINDINGS: Enlargement of cardiac silhouette. Mediastinal contours and pulmonary vascularity normal. Lungs clear. No infiltrate, pleural effusion or pneumothorax. Bones demineralized. IMPRESSION: Enlargement of cardiac silhouette. No acute abnormalities. Electronically Signed   By: Ulyses SouthwardMark  Boles M.D.   On: 09/20/2018 12:20   Ct Head Wo Contrast  Result Date: 09/20/2018 CLINICAL DATA:  Left-sided weakness, facial droop, and slurred speech. EXAM: CT HEAD WITHOUT CONTRAST CT CERVICAL SPINE WITHOUT CONTRAST TECHNIQUE: Multidetector CT imaging of the head and cervical spine was performed following the standard protocol without intravenous contrast. Multiplanar CT image reconstructions of the cervical spine  were also generated. COMPARISON:  None. FINDINGS: CT HEAD FINDINGS Brain: A moderate-sized region of low density involving cortex and white matter in the right temporal, parietal, and frontal lobes and insula is consistent with a subacute infarct. There is also some right basal ganglia involvement. There is regional mass effect with slight effacement of the right lateral ventricle and 3 mm of leftward midline shift. No acute intracranial hemorrhage or extra-axial fluid collection is identified. They were is no age advanced atrophy. Vascular: Calcified atherosclerosis at the skull base. Left cavernous sinus gas, possibly related to vena puncture. Hyperdense appearance of the right MCA in the sylvian fissure in the area of infarct. Skull: No fracture or focal osseous lesion. Sinuses/Orbits: Visualized paranasal sinuses and mastoid air cells are clear. Orbits are unremarkable. Other: None. CT CERVICAL SPINE FINDINGS Alignment: Straightening/slight reversal of the normal cervical lordosis. No significant listhesis. Skull base and vertebrae: No acute fracture or suspicious osseous lesion. Soft tissues and spinal canal: No prevertebral fluid or swelling. No visible canal hematoma. Disc levels: Moderate cervical spondylosis. Multilevel  neural foraminal stenosis due to uncovertebral spurring, moderate to severe bilaterally at C4-5. Upper chest: Clear lung apices. Other: None. IMPRESSION: 1. Moderately large subacute right MCA infarct with 3 mm of leftward midline shift. No hemorrhage. 2. No acute cervical spine fracture. Electronically Signed   By: Sebastian AcheAllen  Grady M.D.   On: 09/20/2018 12:44   Ct Cervical Spine Wo Contrast  Result Date: 09/20/2018 CLINICAL DATA:  Left-sided weakness, facial droop, and slurred speech. EXAM: CT HEAD WITHOUT CONTRAST CT CERVICAL SPINE WITHOUT CONTRAST TECHNIQUE: Multidetector CT imaging of the head and cervical spine was performed following the standard protocol without intravenous contrast.  Multiplanar CT image reconstructions of the cervical spine were also generated. COMPARISON:  None. FINDINGS: CT HEAD FINDINGS Brain: A moderate-sized region of low density involving cortex and white matter in the right temporal, parietal, and frontal lobes and insula is consistent with a subacute infarct. There is also some right basal ganglia involvement. There is regional mass effect with slight effacement of the right lateral ventricle and 3 mm of leftward midline shift. No acute intracranial hemorrhage or extra-axial fluid collection is identified. They were is no age advanced atrophy. Vascular: Calcified atherosclerosis at the skull base. Left cavernous sinus gas, possibly related to vena puncture. Hyperdense appearance of the right MCA in the sylvian fissure in the area of infarct. Skull: No fracture or focal osseous lesion. Sinuses/Orbits: Visualized paranasal sinuses and mastoid air cells are clear. Orbits are unremarkable. Other: None. CT CERVICAL SPINE FINDINGS Alignment: Straightening/slight reversal of the normal cervical lordosis. No significant listhesis. Skull base and vertebrae: No acute fracture or suspicious osseous lesion. Soft tissues and spinal canal: No prevertebral fluid or swelling. No visible canal hematoma. Disc levels: Moderate cervical spondylosis. Multilevel neural foraminal stenosis due to uncovertebral spurring, moderate to severe bilaterally at C4-5. Upper chest: Clear lung apices. Other: None. IMPRESSION: 1. Moderately large subacute right MCA infarct with 3 mm of leftward midline shift. No hemorrhage. 2. No acute cervical spine fracture. Electronically Signed   By: Sebastian AcheAllen  Grady M.D.   On: 09/20/2018 12:44   [place] Carotid Bilateral (at [facility] And [facility] Only)  Result Date: 09/21/2018 CLINICAL DATA:  CVA.  History of hypertension and diabetes. EXAM: BILATERAL CAROTID DUPLEX ULTRASOUND TECHNIQUE: Wallace CullensGray scale imaging, color Doppler and duplex ultrasound were performed of bilateral carotid and  vertebral arteries in the neck. COMPARISON:  None. FINDINGS: Criteria: Quantification of carotid stenosis is based on velocity parameters that correlate the residual internal carotid diameter with NASCET-based stenosis levels, using the diameter of the distal internal carotid lumen as the denominator for stenosis measurement. The following velocity measurements were obtained: RIGHT ICA: 35/7 cm/sec CCA: 73/9 cm/sec SYSTOLIC ICA/CCA RATIO:  0.5 ECA: 96 cm/sec LEFT ICA: 58/16 cm/sec CCA: 83/10 cm/sec SYSTOLIC ICA/CCA RATIO:  0.7 ECA: 52 cm/sec RIGHT CAROTID ARTERY: There is no grayscale evidence of significant intimal thickening or atherosclerotic plaque affecting the interrogated portions of the right carotid system. There are no elevated peak systolic velocities within the interrogated course of the right internal carotid artery to suggest a hemodynamically significant stenosis. RIGHT VERTEBRAL ARTERY:  Antegrade flow LEFT CAROTID ARTERY: There is a minimal amount of eccentric echogenic plaque within the left carotid bulb (images 45 and 47), extending to involve the origin and proximal aspects of the left internal carotid artery (image 56), not resulting in elevated peak systolic velocities within the interrogated course of the left internal carotid artery to suggest a hemodynamically significant stenosis. LEFT VERTEBRAL ARTERY:  Antegrade Flow  Note is made of a apparent cardiac arrhythmia IMPRESSION: 1. Minimal amount of left-sided atherosclerotic plaque, not resulting in a hemodynamically significant stenosis. 2. Unremarkable sonographic evaluation of right carotid system. 3. Incidentally noted cardiac arrhythmia. Further evaluation with ECG monitoring could be performed as indicated Electronically Signed   By: Simonne Come M.D.   On: 09/21/2018 03:18        Scheduled Meds:   stroke: mapping our early stages of recovery book   Does not apply Once   aspirin  300 mg Rectal Daily   Or   aspirin  325 mg  Oral Daily   insulin aspart  0-9 Units Subcutaneous TID WC   Continuous Infusions:  0.9 % NaCl with KCl 20 mEq / L 125 mL/hr at 09/21/18 0133     LOS: 1 day   Time spent= 35 mins    Aldrin Engelhard Joline Maxcy, MD [facility]  If 7PM-7AM, please contact night-coverage [email or web address] 09/21/2018, 8:03 AM

## 2018-09-21 NOTE — Progress Notes (Signed)
Transcranial doppler has been completed. Preliminary results can be found in CV Proc through chart review.   09/21/18 3:28 PM Rachel Fox RVT

## 2018-09-21 NOTE — Progress Notes (Signed)
Modified Barium Swallow Progress Note  Patient Details  Name: Rachel Fox MRN: [id number] Date of Birth: Nov 22, 1945  Today's Date: 09/21/2018  Modified Barium Swallow completed.  Full report located under Chart Review in the Imaging Section.  Brief recommendations include the following:  Clinical Impression  Pt presents with a mild to moderate dysphagia with primarily oral deficits. She has left-sided labial and lingual weakness that results in anterior and posterior spillage of boluses, but with improved containment and control with use of straw. Premature spillage of thin liquids is penetrated and silently aspirated in small quantities. Penetration with nectar thick liquids is trace, shallow, and clears spontaneously either as she completes the swallow or with a reflexive throat clear. Nectar thick liquids did reach the true vocal cords x1 but was still ejected. This occurred in the setting of inability to fully clear soft solids from her oral cavity, with pt needing a liquid wash to assist. Her posterior oral transit was delayed with all solids but more efficient with purees. Recommend starting Dys 1 diet and nectar thick liquids with additional SLP f/u to maximize swallow function and safety.   Swallow Evaluation Recommendations       SLP Diet Recommendations: Dysphagia 1 (Puree) solids;Nectar thick liquid   Liquid Administration via: Straw   Medication Administration: Crushed with puree   Supervision: Patient able to self feed;Full supervision/cueing for compensatory strategies   Compensations: Slow rate;Small sips/bites;Monitor for anterior loss;Lingual sweep for clearance of pocketing   Postural Changes: Seated upright at 90 degrees   Oral Care Recommendations: Oral care BID   Other Recommendations: Order thickener from pharmacy;Prohibited food (jello, ice cream, thin soups);Remove water pitcher    Rachel Fox 09/21/2018,11:19 AM   Pollyann Glen, M.A. [place] [facility]  Environmental education officer [phone] Office [phone]

## 2018-09-21 NOTE — Progress Notes (Addendum)
Progress Note  Patient Name: Rachel Fox Date of Encounter: 09/21/2018  Primary Cardiologist:  Dina RichBranch, Jonathan, MD  Subjective   Denies chest pain or SOB Had weakness and a syncopal episode on 07/04 No awareness of elevated or irregular HR  Inpatient Medications    Scheduled Meds:   stroke: mapping our early stages of recovery book   Does not apply Once   aspirin  300 mg Rectal Daily   Or   aspirin  325 mg Oral Daily   insulin aspart  0-9 Units Subcutaneous TID WC   Continuous Infusions:  0.9 % NaCl with KCl 20 mEq / L 125 mL/hr at 09/21/18 0906   PRN Meds: acetaminophen **OR** acetaminophen (TYLENOL) oral liquid 160 mg/5 mL **OR** acetaminophen, alum & mag hydroxide-simeth, guaiFENesin-dextromethorphan, hydrocortisone, hydrocortisone cream, ipratropium-albuterol, lip balm, loratadine, metoprolol tartrate, Muscle Rub, phenol, polyethylene glycol, polyvinyl alcohol, prochlorperazine, senna-docusate, sodium chloride   Vital Signs    Vitals:   09/21/18 0524 09/21/18 0612 09/21/18 0636 09/21/18 0805  BP:    118/63  Pulse:  96  82  Resp: 17 19 19 20   Temp:    98.2 F (36.8 C)  TempSrc:    Oral  SpO2:    99%  Weight:      Height:        Intake/Output Summary (Last 24 hours) at 09/21/2018 0950 Last data filed at 09/21/2018 0419 Gross per 24 hour  Intake 2252.99 ml  Output 450 ml  Net 1802.99 ml   Filed Weights   09/20/18 1100 09/20/18 1942  Weight: 70.8 kg 72.8 kg   Last Weight  Most recent update: 09/20/2018  7:44 PM   Weight  72.8 kg (160 lb 7.9 oz)           Weight change:    Telemetry    Afib, RVR - Personally Reviewed  ECG    07/06, Afib, RVR, HR 184  - Personally Reviewed  Physical Exam   General: Well developed, well nourished, female appearing in no acute distress. Head: Normocephalic, L facial droop  Neck: Supple without bruits, no JVD. Lungs:  Resp regular and unlabored, CTA. Heart: Irreg R&R, S1, S2, no S3, S4, or murmur; no  rub. Abdomen: Soft, non-tender, non-distended with normoactive bowel sounds. No hepatomegaly. No rebound/guarding. No obvious abdominal masses. Extremities: No clubbing, cyanosis, no edema. Distal pedal pulses are 2+ bilaterally. Neuro: Alert and oriented X 3. L sided weakness  Labs    Hematology Recent Labs  Lab 09/20/18 1130 09/20/18 1142  WBC  --  14.0*  RBC  --  5.81*  HGB 17.7* 15.5*  HCT 52.0* 49.2*  MCV  --  84.7  MCH  --  26.7  MCHC  --  31.5  RDW  --  14.9  PLT  --  207    Chemistry Recent Labs  Lab 09/20/18 1130 09/20/18 1142  NA 149* 146*  K 4.4 3.8  CL 112* 107  CO2  --  23  GLUCOSE 128* 132*  BUN 34* 31*  CREATININE 1.00 1.04*  CALCIUM  --  10.4*  PROT  --  8.5*  ALBUMIN  --  4.2  AST  --  63*  ALT  --  30  ALKPHOS  --  61  BILITOT  --  1.3*  GFRNONAA  --  54*  GFRAA  --  >60  ANIONGAP  --  16*     High Sensitivity Troponin:   Recent Labs  Lab 09/20/18 1142  TROPONINIHS 19.00*    BNP Recent Labs  Lab 09/20/18 1142  BNP 485.0*    No results found for: TSH   Radiology    Dg Chest 1 View  Result Date: 09/20/2018 CLINICAL DATA:  Left side weak this EXAM: CHEST  1 VIEW COMPARISON:  Portable exam 1149 hours compared to 02/18/2008 FINDINGS: Enlargement of cardiac silhouette. Mediastinal contours and pulmonary vascularity normal. Lungs clear. No infiltrate, pleural effusion or pneumothorax. Bones demineralized. IMPRESSION: Enlargement of cardiac silhouette. No acute abnormalities. Electronically Signed   By: Lavonia Dana M.D.   On: 09/20/2018 12:20   Ct Head Wo Contrast  Result Date: 09/20/2018 CLINICAL DATA:  Left-sided weakness, facial droop, and slurred speech. EXAM: CT HEAD WITHOUT CONTRAST CT CERVICAL SPINE WITHOUT CONTRAST TECHNIQUE: Multidetector CT imaging of the head and cervical spine was performed following the standard protocol without intravenous contrast. Multiplanar CT image reconstructions of the cervical spine were also  generated. COMPARISON:  None. FINDINGS: CT HEAD FINDINGS Brain: A moderate-sized region of low density involving cortex and white matter in the right temporal, parietal, and frontal lobes and insula is consistent with a subacute infarct. There is also some right basal ganglia involvement. There is regional mass effect with slight effacement of the right lateral ventricle and 3 mm of leftward midline shift. No acute intracranial hemorrhage or extra-axial fluid collection is identified. They were is no age advanced atrophy. Vascular: Calcified atherosclerosis at the skull base. Left cavernous sinus gas, possibly related to vena puncture. Hyperdense appearance of the right MCA in the sylvian fissure in the area of infarct. Skull: No fracture or focal osseous lesion. Sinuses/Orbits: Visualized paranasal sinuses and mastoid air cells are clear. Orbits are unremarkable. Other: None. CT CERVICAL SPINE FINDINGS Alignment: Straightening/slight reversal of the normal cervical lordosis. No significant listhesis. Skull base and vertebrae: No acute fracture or suspicious osseous lesion. Soft tissues and spinal canal: No prevertebral fluid or swelling. No visible canal hematoma. Disc levels: Moderate cervical spondylosis. Multilevel neural foraminal stenosis due to uncovertebral spurring, moderate to severe bilaterally at C4-5. Upper chest: Clear lung apices. Other: None. IMPRESSION: 1. Moderately large subacute right MCA infarct with 3 mm of leftward midline shift. No hemorrhage. 2. No acute cervical spine fracture. Electronically Signed   By: Logan Bores M.D.   On: 09/20/2018 12:44   Ct Cervical Spine Wo Contrast  Result Date: 09/20/2018 CLINICAL DATA:  Left-sided weakness, facial droop, and slurred speech. EXAM: CT HEAD WITHOUT CONTRAST CT CERVICAL SPINE WITHOUT CONTRAST TECHNIQUE: Multidetector CT imaging of the head and cervical spine was performed following the standard protocol without intravenous contrast. Multiplanar  CT image reconstructions of the cervical spine were also generated. COMPARISON:  None. FINDINGS: CT HEAD FINDINGS Brain: A moderate-sized region of low density involving cortex and white matter in the right temporal, parietal, and frontal lobes and insula is consistent with a subacute infarct. There is also some right basal ganglia involvement. There is regional mass effect with slight effacement of the right lateral ventricle and 3 mm of leftward midline shift. No acute intracranial hemorrhage or extra-axial fluid collection is identified. They were is no age advanced atrophy. Vascular: Calcified atherosclerosis at the skull base. Left cavernous sinus gas, possibly related to vena puncture. Hyperdense appearance of the right MCA in the sylvian fissure in the area of infarct. Skull: No fracture or focal osseous lesion. Sinuses/Orbits: Visualized paranasal sinuses and mastoid air cells are clear. Orbits are unremarkable. Other: None. CT CERVICAL SPINE FINDINGS Alignment: Straightening/slight reversal  of the normal cervical lordosis. No significant listhesis. Skull base and vertebrae: No acute fracture or suspicious osseous lesion. Soft tissues and spinal canal: No prevertebral fluid or swelling. No visible canal hematoma. Disc levels: Moderate cervical spondylosis. Multilevel neural foraminal stenosis due to uncovertebral spurring, moderate to severe bilaterally at C4-5. Upper chest: Clear lung apices. Other: None. IMPRESSION: 1. Moderately large subacute right MCA infarct with 3 mm of leftward midline shift. No hemorrhage. 2. No acute cervical spine fracture. Electronically Signed   By: Sebastian AcheAllen  Grady M.D.   On: 09/20/2018 12:44   [place] Carotid Bilateral (at [facility] Only)  Result Date: 09/21/2018 CLINICAL DATA:  CVA.  History of hypertension and diabetes. EXAM: BILATERAL CAROTID DUPLEX ULTRASOUND TECHNIQUE: Wallace CullensGray scale imaging, color Doppler and duplex ultrasound were performed of bilateral carotid and vertebral  arteries in the neck. COMPARISON:  None. FINDINGS: Criteria: Quantification of carotid stenosis is based on velocity parameters that correlate the residual internal carotid diameter with NASCET-based stenosis levels, using the diameter of the distal internal carotid lumen as the denominator for stenosis measurement. The following velocity measurements were obtained: RIGHT ICA: 35/7 cm/sec CCA: 73/9 cm/sec SYSTOLIC ICA/CCA RATIO:  0.5 ECA: 96 cm/sec LEFT ICA: 58/16 cm/sec CCA: 83/10 cm/sec SYSTOLIC ICA/CCA RATIO:  0.7 ECA: 52 cm/sec RIGHT CAROTID ARTERY: There is no grayscale evidence of significant intimal thickening or atherosclerotic plaque affecting the interrogated portions of the right carotid system. There are no elevated peak systolic velocities within the interrogated course of the right internal carotid artery to suggest a hemodynamically significant stenosis. RIGHT VERTEBRAL ARTERY:  Antegrade flow LEFT CAROTID ARTERY: There is a minimal amount of eccentric echogenic plaque within the left carotid bulb (images 45 and 47), extending to involve the origin and proximal aspects of the left internal carotid artery (image 56), not resulting in elevated peak systolic velocities within the interrogated course of the left internal carotid artery to suggest a hemodynamically significant stenosis. LEFT VERTEBRAL ARTERY:  Antegrade Flow Note is made of a apparent cardiac arrhythmia IMPRESSION: 1. Minimal amount of left-sided atherosclerotic plaque, not resulting in a hemodynamically significant stenosis. 2. Unremarkable sonographic evaluation of right carotid system. 3. Incidentally noted cardiac arrhythmia. Further evaluation with ECG monitoring could be performed as indicated Electronically Signed   By: Simonne ComeJohn  Watts M.D.   On: 09/21/2018 03:18     Cardiac Studies   ECHO:  Results pending  Patient Profile     73 y.o. female w/ hx DM, HTN, HLD, was transferred from [facility] 07/06 for subacute CVA and Afib  RVR>>[facility]  Assessment & Plan    1. Afib, RVR - new dx, rapid rate, not much room in BP to titrate rx - not on any rate control rx and renal function ok - Neuro would probably like permissive HTN so will limit BP lowering rx (SBP currently 110s-120s) - dig has been loaded w/ 0.375 mg & 0.25 mg IV on 07/06, then got 0.25 mg x 2 doses 07/07. Will give 0.25 mg IV q day starting 07/08 - however, rate is not well-controlled - may need amio - CHA2DS2-VASc = 4 (DM, HTN, female, age 73>65) - pt is candidate for anticoag, but will defer timing to Neuro.  Otherwise, per IM, Neuro Principal Problem:   Cerebrovascular accident (CVA) with involvement of left side of body (HCC) Active Problems:   Atrial fibrillation with RVR (HCC)   Hypertension   Rhabdomyolysis   Type 2 diabetes mellitus (HCC)  Hyperlipidemia   Hyperbilirubinemia   Leukocytosis   Hypernatremia   Pressure injury of skin    Signed, Theodore DemarkRhonda Barrett , PA-C 9:50 AM 09/21/2018 Pager: [phone]  Personally seen and examined. Agree with above.   AFIB RVR continues. Will give Digoxin IV push again today. Tomorrow start Dig 125mcg PO QD Neuro would like to hold on anticoagulation for now given stroke. Will defer to their timing.  ECHO: 09/21/18  1. The left ventricle has hyperdynamic systolic function, with an ejection fraction of >65%. The cavity size was normal. There is mildly increased left ventricular wall thickness. Left ventricular diastolic function could not be evaluated secondary to  atrial fibrillation.  2. The right ventricle has normal systolic function. The cavity was normal. There is no increase in right ventricular wall thickness.  3. Trivial pericardial effusion is present.  4. No evidence of mitral valve stenosis.  5. No stenosis of the aortic valve.  6. The interatrial septum was not assessed.  Reassuring  Will follow.   Donato SchultzMark Kenzley Ke, MD

## 2018-09-22 DIAGNOSIS — I639 Cerebral infarction, unspecified: Secondary | ICD-10-CM

## 2018-09-22 DIAGNOSIS — I4819 Other persistent atrial fibrillation: Secondary | ICD-10-CM

## 2018-09-22 LAB — BASIC METABOLIC PANEL
Anion gap: 7 (ref 5–15)
BUN: 12 mg/dL (ref 8–23)
CO2: 26 mmol/L (ref 22–32)
Calcium: 8.5 mg/dL — ABNORMAL LOW (ref 8.9–10.3)
Chloride: 108 mmol/L (ref 98–111)
Creatinine, Ser: 0.92 mg/dL (ref 0.44–1.00)
GFR calc Af Amer: >60 mL/min (ref >60)
GFR calc non Af Amer: >60 mL/min (ref >60)
Glucose, Bld: 114 mg/dL — ABNORMAL HIGH (ref 70–99)
Potassium: 4 mmol/L (ref 3.5–5.1)
Sodium: 141 mmol/L (ref 135–145)

## 2018-09-22 LAB — CBC
HCT: 43.7 % (ref 36.0–46.0)
Hemoglobin: 13.8 g/dL (ref 12.0–15.0)
MCH: 27.3 pg (ref 26.0–34.0)
MCHC: 31.6 g/dL (ref 30.0–36.0)
MCV: 86.4 fL (ref 80.0–100.0)
Platelets: 175 10*3/uL (ref 150–400)
RBC: 5.06 MIL/uL (ref 3.87–5.11)
RDW: 13.8 % (ref 11.5–15.5)
WBC: 11.5 10*3/uL — ABNORMAL HIGH (ref 4.0–10.5)
nRBC: 0 % (ref 0.0–0.2)

## 2018-09-22 LAB — GLUCOSE, CAPILLARY
Glucose-Capillary: 111 mg/dL — ABNORMAL HIGH (ref 70–99)
Glucose-Capillary: 89 mg/dL (ref 70–99)
Glucose-Capillary: 194 mg/dL — ABNORMAL HIGH (ref 70–99)
Glucose-Capillary: 64 mg/dL — ABNORMAL LOW (ref 70–99)
Glucose-Capillary: 122 mg/dL — ABNORMAL HIGH (ref 70–99)

## 2018-09-22 LAB — CK: Total CK: 1637 U/L — ABNORMAL HIGH (ref 38–234)

## 2018-09-22 LAB — FOLATE RBC
Folate, Hemolysate: 604 ng/mL
Folate, RBC: 1370 ng/mL (ref >498)
Hematocrit: 44.1 % (ref 34.0–46.6)

## 2018-09-22 LAB — MAGNESIUM: Magnesium: 1.9 mg/dL (ref 1.7–2.4)

## 2018-09-22 MED ORDER — DILTIAZEM HCL 25 MG/5ML IV SOLN
10.0000 mg | Freq: Once | INTRAVENOUS | Status: AC
  Administered 2018-09-22: 10 mg via INTRAVENOUS
  Filled 2018-09-22: qty 5

## 2018-09-22 MED ORDER — ATORVASTATIN CALCIUM 80 MG PO TABS
80.0000 mg | ORAL_TABLET | Freq: Every day | ORAL | Status: DC
  Administered 2018-09-22 – 2018-09-26 (×5): 80 mg via ORAL
  Filled 2018-09-22 (×5): qty 1

## 2018-09-22 MED ORDER — AMIODARONE HCL 200 MG PO TABS
200.0000 mg | ORAL_TABLET | Freq: Two times a day (BID) | ORAL | Status: DC
  Administered 2018-09-22 – 2018-09-27 (×11): 200 mg via ORAL
  Filled 2018-09-22 (×11): qty 1

## 2018-09-22 MED ORDER — DILTIAZEM HCL 30 MG PO TABS
30.0000 mg | ORAL_TABLET | Freq: Three times a day (TID) | ORAL | Status: DC
  Administered 2018-09-22 – 2018-09-23 (×3): 30 mg via ORAL
  Filled 2018-09-22 (×3): qty 1

## 2018-09-22 MED ORDER — DIGOXIN 125 MCG PO TABS
0.1250 mg | ORAL_TABLET | Freq: Every day | ORAL | Status: DC
  Administered 2018-09-22 – 2018-09-27 (×6): 0.125 mg via ORAL
  Filled 2018-09-22 (×6): qty 1

## 2018-09-22 NOTE — Progress Notes (Signed)
Pt has a low blood sugar level of 64 dl/mg but asymptomatic of hypoglycemia   Could take po 4 oz of O J given.  CBG rechecked later and is 122. Pt denied any symptoms vs stable, BP 124.54 hr 117 res 20 temp 97.4  No acute distress [facility] on call medical personnel on the unit and made aware. RN will continue to monitor pt.

## 2018-09-22 NOTE — NC FL2 (Signed)
[place] LEVEL OF CARE SCREENING TOOL     IDENTIFICATION  Patient Name: Rachel Fox Birthdate: 09/06/1945 Sex: female Admission Date (Current Location): 09/20/2018  [facility] and [place] Number:  [employer] and Address:  [facility] [place], [place] [address] [place], [place] [address]      Provider Number: [id number]  Attending Physician Name and Address:  Damita Lack, MD  Relative Name and Phone Number:       Current Level of Care: Hospital Recommended Level of Care: [place] Prior Approval Number:    Date Approved/Denied:   PASRR Number: [id number]  Discharge Plan: SNF    Current Diagnoses: Patient Active Problem List   Diagnosis Date Noted  . Pressure injury of skin 09/21/2018  . Cerebrovascular accident (CVA) with involvement of left side of body ([place]) 09/20/2018  . Atrial fibrillation with RVR ([place]) 09/20/2018  . Hypertension   . Rhabdomyolysis   . Type 2 diabetes mellitus ([place])   . Hyperlipidemia   . Hyperbilirubinemia   . Leukocytosis   . Hypernatremia     Orientation RESPIRATION BLADDER Height & Weight     Self, Time, Situation, Place  Normal Continent Weight: 72.8 kg Height:  5\' 6"  (167.6 cm)  BEHAVIORAL SYMPTOMS/MOOD NEUROLOGICAL BOWEL NUTRITION STATUS      Continent Diet(dysphagia 1 with nectar thick liquids)  AMBULATORY STATUS COMMUNICATION OF NEEDS Skin   Extensive Assist Verbally Other (Comment)(stage 2 to left buttock with a foam dressing)                       Personal Care Assistance Level of Assistance  Bathing, Dressing, Feeding Bathing Assistance: Limited assistance Feeding assistance: Limited assistance Dressing Assistance: Maximum assistance     Functional Limitations Info  Sight, Hearing, Speech Sight Info: Adequate Hearing Info: Adequate Speech Info: Impaired    SPECIAL CARE FACTORS FREQUENCY  PT (By licensed PT), OT (By licensed OT), Speech therapy     PT Frequency: 5x/wk OT Frequency: 5x/wk     Speech Therapy Frequency: 5x/wk      Contractures Contractures Info: Not present    Additional Factors Info  Code Status, Allergies, Insulin Sliding Scale Code Status Info: Full Allergies Info: NKA   Insulin Sliding Scale Info: Novolog 0-9 units SQ three times a day       Current Medications (09/22/2018):  This is the current hospital active medication list Current Facility-Administered Medications  Medication Dose Route Frequency Provider Last Rate Last Dose  .  stroke: mapping our early stages of recovery book   Does not apply Once Reubin Milan, MD      . 0.9 % NaCl with KCl 20 mEq/ L  infusion   Intravenous Continuous Reubin Milan, MD 125 mL/hr at 09/22/18 1341    . acetaminophen (TYLENOL) tablet 650 mg  650 mg Oral Q4H PRN Reubin Milan, MD   650 mg at 09/21/18 2017   Or  . acetaminophen (TYLENOL) solution 650 mg  650 mg Per Tube Q4H PRN Reubin Milan, MD       Or  . acetaminophen (TYLENOL) suppository 650 mg  650 mg Rectal Q4H PRN Reubin Milan, MD      . alum & mag hydroxide-simeth (MAALOX/MYLANTA) 200-200-20 MG/5ML suspension 30 mL  30 mL Oral Q4H PRN Amin, Ankit Chirag, MD      . amiodarone (PACERONE) tablet 200 mg  200 mg Oral BID Candee Furbish  C, MD   200 mg at 09/22/18 0915  . aspirin suppository 300 mg  300 mg Rectal Daily Bobette Mortiz, David Manuel, MD   300 mg at 09/21/18 [id number]   Or  . aspirin tablet 325 mg  325 mg Oral Daily Bobette Mortiz, David Manuel, MD   325 mg at 09/22/18 0915  . atorvastatin (LIPITOR) tablet 80 mg  80 mg Oral q1800 Jake BatheSkains, Mark C, MD      . digoxin (LANOXIN) tablet 0.125 mg  0.125 mg Oral Daily Jake BatheSkains, Mark C, MD   0.125 mg at 09/22/18 0915  . diltiazem (CARDIZEM) tablet 30 mg  30 mg Oral Q8H Runell GessBerry, Jonathan J, MD      . enoxaparin (LOVENOX) injection 40 mg  40 mg Subcutaneous Q24H Annie MainBiby, Sharon L, NP   40 mg at 09/21/18 2144  . guaiFENesin-dextromethorphan (ROBITUSSIN DM) 100-10  MG/5ML syrup 5 mL  5 mL Oral Q4H PRN Amin, Ankit Chirag, MD      . hydrocortisone (ANUSOL-HC) 2.5 % rectal cream 1 application  1 application Topical QID PRN Amin, Ankit Chirag, MD      . hydrocortisone cream 1 % 1 application  1 application Topical TID PRN Amin, Ankit Chirag, MD      . insulin aspart (novoLOG) injection 0-9 Units  0-9 Units Subcutaneous TID WC Bobette Mortiz, David Manuel, MD   1 Units at 09/21/18 1649  . ipratropium-albuterol (DUONEB) 0.5-2.5 (3) MG/3ML nebulizer solution 3 mL  3 mL Nebulization Q4H PRN Amin, Ankit Chirag, MD      . lip balm (CARMEX) ointment 1 application  1 application Topical PRN Amin, Ankit Chirag, MD      . loratadine (CLARITIN) tablet 10 mg  10 mg Oral Daily PRN Amin, Ankit Chirag, MD      . metoprolol tartrate (LOPRESSOR) injection 5 mg  5 mg Intravenous Q4H PRN Amin, Ankit Chirag, MD   5 mg at 09/22/18 1340  . Muscle Rub CREA 1 application  1 application Topical PRN Amin, Ankit Chirag, MD      . phenol (CHLORASEPTIC) mouth spray 1 spray  1 spray Mouth/Throat PRN Amin, Ankit Chirag, MD      . polyethylene glycol (MIRALAX / GLYCOLAX) packet 17 g  17 g Oral Daily PRN Amin, Ankit Chirag, MD      . polyvinyl alcohol (LIQUIFILM TEARS) 1.4 % ophthalmic solution 1 drop  1 drop Both Eyes PRN Amin, Ankit Chirag, MD      . prochlorperazine (COMPAZINE) injection 5 mg  5 mg Intravenous Q4H PRN Bobette Mortiz, David Manuel, MD      . Resource ThickenUp Clear   Oral PRN Nelson ChimesAmin, Loura HaltAnkit Chirag, MD      . senna-docusate (Senokot-S) tablet 2 tablet  2 tablet Oral QHS PRN Amin, Ankit Chirag, MD      . sodium chloride (OCEAN) 0.65 % nasal spray 1 spray  1 spray Each Nare PRN Amin, Loura HaltAnkit Chirag, MD         Discharge Medications: Please see discharge summary for a list of discharge medications.  Relevant Imaging Results:  Relevant Lab Results:   Additional Information SS#: [id number]  Kermit BaloKelli F Cayla Wiegand, RN

## 2018-09-22 NOTE — Progress Notes (Signed)
  Speech Language Pathology Treatment: Dysphagia;Cognitive-Linquistic  Patient Details Name: Rachel Fox MRN: [id number] DOB: 1945/12/09 Today's Date: 09/22/2018 Time: 1025-8527 SLP Time Calculation (min) (ACUTE ONLY): 17 min  Assessment / Plan / Recommendation Clinical Impression  Pt self-fed trials of current diet textures with Min cues for use of strategies, including monitoring for anterior spillage and using lingual sweep to clear any buccal residue. Pt had little to know buccal pocketing, but RN does report pocketing during meals. She did not have any overt coughing with current textures - will likely be best to stay on current diet for now, but will f/u for advanced trials.   During self-feeding, pt found items on the L side of her table with Mod I, although she does have a R gaze preference. Although she demonstrated emergent awareness of of left-sided weakness, Min cues were needed for functional problem solving to accommodate for LUE weakness as it impacted opening of containers, self-feeding. Min cues were also provided for sustained attention, but she needed Mod cues for use of speech intelligibility strategies at the conversational level. Will continue to follow acutely, but she will need additional SLP f/u post-discharge as well.   HPI HPI: 73 year old with history of atrial fibrillation, diabetes mellitus type 2, essential hypertension who was brought to the hospital for evaluation of fall.  She was found to be in mild rhabdomyolysis, atrial fibrillation with RVR and also a large right-sided MCA CVA on the CT of the head      SLP Plan  Continue with current plan of care       Recommendations  Diet recommendations: Dysphagia 1 (puree);Nectar-thick liquid Liquids provided via: Straw Medication Administration: Whole meds with puree Supervision: Full supervision/cueing for compensatory strategies;Patient able to self feed Compensations: Slow rate;Small sips/bites;Monitor for  anterior loss;Lingual sweep for clearance of pocketing Postural Changes and/or Swallow Maneuvers: Seated upright 90 degrees                Oral Care Recommendations: Oral care BID Follow up Recommendations: Skilled Nursing facility;24 hour supervision/assistance SLP Visit Diagnosis: Dysphagia, oropharyngeal phase (R13.12) Plan: Continue with current plan of care       Woodruff Hamdi Vari 09/22/2018, 2:37 PM  Pollyann Glen, M.A. [place] Acute Environmental education officer [phone] Office [phone]

## 2018-09-22 NOTE — Progress Notes (Signed)
PT Cancellation Note  Patient Details Name: Rachel Fox MRN: [id number] DOB: Feb 22, 1946   Cancelled Treatment:    Reason Eval/Treat Not Completed: Medical issues which prohibited therapy.  HR 110-150 at rest in A-fib.  RN has given metoprolol.  RN to call MD.  PT to check back later today to see if rate is better and pt is safer to mobilize.    Thanks,  Barbarann Ehlers. Zyion Leidner, PT, DPT  Acute Rehabilitation [phone] pager [phone] office  @ [facility]: [phone]     Harvie Heck 09/22/2018, 10:12 AM

## 2018-09-22 NOTE — Plan of Care (Signed)
Pt continues to improve with her speech.

## 2018-09-22 NOTE — TOC Initial Note (Signed)
Transition of Care [facility]) - Initial/Assessment Note    Patient Details  Name: Rachel Fox MRN: [id number] Date of Birth: Nov 16, 1945  Transition of Care [facility]) CM/SW Contact:    Pollie Friar, RN Phone Number: 09/22/2018, 3:10 PM  Clinical Narrative:                 CM met with the patient and she asked that her son: Sonia Side be notified. CM called Sonia Side and he asked that CM discuss plans with his cousin Deedee. Deedee called CM and went over the recommendations when medically ready for d/c. Deedee was in agreement with the plan for SNF rehab. CM sent her via email the link to medicare.gov so she can view the different SNF rehabs in [place] and [place].  Deedee asking about speaking with the MD. Per Sonia Side he had discussed this with the patients RN and the RN was going to inform the MD. CM verified this with bedside RN.  TOC following.  Expected Discharge Plan: Skilled Nursing Facility Barriers to Discharge: Continued Medical Work up   Patient Goals and CMS Choice   CMS Medicare.gov Compare Post Acute Care list provided to:: Patient Represenative (must comment)(pts niece) Choice offered to / list presented to : (niece)  Expected Discharge Plan and Services Expected Discharge Plan: Skilled Nursing Facility In-house Referral: Clinical Social Work Discharge Planning Services: CM Consult Post Acute Care Choice: [place]                                        Prior Living Arrangements/Services   Lives with:: Self Patient language and need for interpreter reviewed:: Yes(no needs) Do you feel safe going back to the place where you live?: Yes      Need for Family Participation in Patient Care: Yes (Comment)(24 hour supervision and assistance) Care giver support system in place?: Yes (comment)(no)   Criminal Activity/Legal Involvement Pertinent to Current Situation/Hospitalization: No - Comment as needed  Activities of Daily Living Home Assistive  Devices/Equipment: None ADL Screening (condition at time of admission) Patient's cognitive ability adequate to safely complete daily activities?: Yes Is the patient deaf or have difficulty hearing?: No Does the patient have difficulty seeing, even when wearing glasses/contacts?: No Does the patient have difficulty concentrating, remembering, or making decisions?: No Patient able to express need for assistance with ADLs?: Yes Does the patient have difficulty dressing or bathing?: Yes Independently performs ADLs?: No Communication: Independent Dressing (OT): Needs assistance Is this a change from baseline?: Change from baseline, expected to last >3 days Grooming: Needs assistance Is this a change from baseline?: Change from baseline, expected to last >3 days Feeding: Independent Bathing: Needs assistance Is this a change from baseline?: Change from baseline, expected to last >3 days Toileting: Needs assistance Is this a change from baseline?: Change from baseline, expected to last >3days In/Out Bed: Needs assistance Is this a change from baseline?: Change from baseline, expected to last >3 days Walks in Home: Needs assistance Is this a change from baseline?: Change from baseline, expected to last >3 days Does the patient have difficulty walking or climbing stairs?: Yes Weakness of Legs: Left Weakness of Arms/Hands: Left  Permission Sought/Granted                  Emotional Assessment Appearance:: Appears stated age Attitude/Demeanor/Rapport: Engaged Affect (typically observed): Accepting, Pleasant Orientation: : Oriented to Self, Oriented  to Place, Oriented to  Time, Oriented to Situation   Psych Involvement: No (comment)  Admission diagnosis:  CVA (cerebrovascular accident) [employer]) [I63.9] New onset atrial fibrillation ([place]) [I48.91] Atrial fibrillation with rapid ventricular response ([place]) [I48.91] Left-sided weakness [R53.1] Non-traumatic rhabdomyolysis  [M62.82] Cerebrovascular accident (CVA), unspecified mechanism ([place]) [I63.9] Patient Active Problem List   Diagnosis Date Noted  . Pressure injury of skin 09/21/2018  . Cerebrovascular accident (CVA) with involvement of left side of body ([place]) 09/20/2018  . Atrial fibrillation with RVR ([place]) 09/20/2018  . Hypertension   . Rhabdomyolysis   . Type 2 diabetes mellitus ([place])   . Hyperlipidemia   . Hyperbilirubinemia   . Leukocytosis   . Hypernatremia    PCP:  Celene Squibb, MD Pharmacy:   [facility], [address] AT [address] [place] [address] Phone: [phone] Fax: [phone]     Social Determinants of Health (SDOH) Interventions    Readmission Risk Interventions No flowsheet data found.

## 2018-09-22 NOTE — Progress Notes (Signed)
STROKE TEAM PROGRESS NOTE   INTERVAL HISTORY She is alert and interactive patient continues to have right gaze preference and left hemiplegia and remains in atrial fibrillation with rapid ventricular response.  Cardiology has seen the patient and  started digoxin and plan to add amiodarone p.o. Vitals:   09/22/18 0409 09/22/18 0753 09/22/18 0915 09/22/18 1224  BP:  117/68  138/82  Pulse: (!) 142 76 (!) 136 (!) 119  Resp:  13  (!) 26  Temp:  98.1 F (36.7 C)  98.3 F (36.8 C)  TempSrc:  Oral  Axillary  SpO2:  100%  98%  Weight:      Height:        CBC:  Recent Labs  Lab 09/20/18 1142 09/22/18 0313  WBC 14.0* 11.5*  NEUTROABS 11.5*  --   HGB 15.5* 13.8  HCT 49.2* 43.7  MCV 84.7 86.4  PLT 207 381    Basic Metabolic Panel:  Recent Labs  Lab 09/20/18 1142 09/22/18 0313  NA 146* 141  K 3.8 4.0  CL 107 108  CO2 23 26  GLUCOSE 132* 114*  BUN 31* 12  CREATININE 1.04* 0.92  CALCIUM 10.4* 8.5*  MG 2.3 1.9  PHOS 2.7  --    Lipid Panel:     Component Value Date/Time   CHOL 184 09/21/2018 1120   TRIG 107 09/21/2018 1120   HDL 42 09/21/2018 1120   CHOLHDL 4.4 09/21/2018 1120   VLDL 21 09/21/2018 1120   LDLCALC 121 (H) 09/21/2018 1120   HgbA1c:  Lab Results  Component Value Date   HGBA1C 6.1 (H) 09/21/2018   Urine Drug Screen:     Component Value Date/Time   LABOPIA NONE DETECTED 09/20/2018 1246   COCAINSCRNUR NONE DETECTED 09/20/2018 1246   LABBENZ NONE DETECTED 09/20/2018 1246   AMPHETMU NONE DETECTED 09/20/2018 1246   THCU NONE DETECTED 09/20/2018 1246   LABBARB NONE DETECTED 09/20/2018 1246    Alcohol Level     Component Value Date/Time   ETH <10 09/20/2018 1124    IMAGING US Carotid Bilateral (at [facility] Only)  Result Date: 09/21/2018 CLINICAL DATA:  CVA.  History of hypertension and diabetes. EXAM: BILATERAL CAROTID DUPLEX ULTRASOUND TECHNIQUE: Pearline Cables scale imaging, color Doppler and duplex ultrasound were performed of bilateral carotid and  vertebral arteries in the neck. COMPARISON:  None. FINDINGS: Criteria: Quantification of carotid stenosis is based on velocity parameters that correlate the residual internal carotid diameter with NASCET-based stenosis levels, using the diameter of the distal internal carotid lumen as the denominator for stenosis measurement. The following velocity measurements were obtained: RIGHT ICA: 35/7 cm/sec CCA: 82/9 cm/sec SYSTOLIC ICA/CCA RATIO:  0.5 ECA: 96 cm/sec LEFT ICA: 58/16 cm/sec CCA: 93/71 cm/sec SYSTOLIC ICA/CCA RATIO:  0.7 ECA: 52 cm/sec RIGHT CAROTID ARTERY: There is no grayscale evidence of significant intimal thickening or atherosclerotic plaque affecting the interrogated portions of the right carotid system. There are no elevated peak systolic velocities within the interrogated course of the right internal carotid artery to suggest a hemodynamically significant stenosis. RIGHT VERTEBRAL ARTERY:  Antegrade flow LEFT CAROTID ARTERY: There is a minimal amount of eccentric echogenic plaque within the left carotid bulb (images 45 and 47), extending to involve the origin and proximal aspects of the left internal carotid artery (image 56), not resulting in elevated peak systolic velocities within the interrogated course of the left internal carotid artery to suggest a hemodynamically significant stenosis. LEFT VERTEBRAL ARTERY:  Antegrade Flow Note is made of  a apparent cardiac arrhythmia IMPRESSION: 1. Minimal amount of left-sided atherosclerotic plaque, not resulting in a hemodynamically significant stenosis. 2. Unremarkable sonographic evaluation of right carotid system. 3. Incidentally noted cardiac arrhythmia. Further evaluation with ECG monitoring could be performed as indicated Electronically Signed   By: Simonne Come M.D.   On: 09/21/2018 03:18   Dg Swallowing Func-speech Pathology  Result Date: 09/21/2018 Objective Swallowing Evaluation: Type of Study: MBS-Modified Barium Swallow Study  Patient Details  Name: LYLITH BEBEAU MRN: [id number] Date of Birth: [birth year] Today's Date: 09/21/2018 Time: SLP Start Time (ACUTE ONLY): 1020 -SLP Stop Time (ACUTE ONLY): 1039 SLP Time Calculation (min) (ACUTE ONLY): 19 min Past Medical History: Past Medical History: Diagnosis Date . High cholesterol  . Hypertension  . Type 2 diabetes mellitus (HCC)  Past Surgical History: Past Surgical History: Procedure Laterality Date . ABDOMINAL HYSTERECTOMY   . TUBAL LIGATION   HPI: 73 year old with history of atrial fibrillation, diabetes mellitus type 2, essential hypertension who was brought to the hospital for evaluation of fall.  She was found to be in mild rhabdomyolysis, atrial fibrillation with RVR and also a large right-sided MCA CVA on the CT of the head  Subjective: alert and asking for water Assessment / Plan / Recommendation CHL IP CLINICAL IMPRESSIONS 09/21/2018 Clinical Impression Pt presents with a mild to moderate dysphagia with primarily oral deficits. She has left-sided labial and lingual weakness that results in anterior and posterior spillage of boluses, but with improved containment and control with use of straw. Premature spillage of thin liquids is penetrated and silently aspirated in small quantities. Penetration with nectar thick liquids is trace, shallow, and clears spontaneously either as she completes the swallow or with a reflexive throat clear. Nectar thick liquids did reach the true vocal cords x1 but was still ejected. This occurred in the setting of inability to fully clear soft solids from her oral cavity, with pt needing a liquid wash to assist. Her posterior oral transit was delayed with all solids but more efficient with purees. Recommend starting Dys 1 diet and nectar thick liquids with additional SLP f/u to maximize swallow function and safety. SLP Visit Diagnosis Dysphagia, oropharyngeal phase (R13.12) Attention and concentration deficit following -- Frontal lobe and executive function deficit following  -- Impact on safety and function Mild aspiration risk   CHL IP TREATMENT RECOMMENDATION 09/21/2018 Treatment Recommendations Therapy as outlined in treatment plan below   Prognosis 09/21/2018 Prognosis for Safe Diet Advancement Good Barriers to Reach Goals -- Barriers/Prognosis Comment -- CHL IP DIET RECOMMENDATION 09/21/2018 SLP Diet Recommendations Dysphagia 1 (Puree) solids;Nectar thick liquid Liquid Administration via Straw Medication Administration Crushed with puree Compensations Slow rate;Small sips/bites;Monitor for anterior loss;Lingual sweep for clearance of pocketing Postural Changes Seated upright at 90 degrees   CHL IP OTHER RECOMMENDATIONS 09/21/2018 Recommended Consults -- Oral Care Recommendations Oral care BID Other Recommendations Order thickener from pharmacy;Prohibited food (jello, ice cream, thin soups);Remove water pitcher   CHL IP FOLLOW UP RECOMMENDATIONS 09/21/2018 Follow up Recommendations Inpatient Rehab   CHL IP FREQUENCY AND DURATION 09/21/2018 Speech Therapy Frequency (ACUTE ONLY) min 2x/week Treatment Duration 2 weeks      CHL IP ORAL PHASE 09/21/2018 Oral Phase Impaired Oral - Pudding Teaspoon -- Oral - Pudding Cup -- Oral - Honey Teaspoon -- Oral - Honey Cup -- Oral - Nectar Teaspoon -- Oral - Nectar Cup Premature spillage Oral - Nectar Straw Premature spillage Oral - Thin Teaspoon -- Oral - Thin Cup Premature spillage Oral - Thin  Straw Premature spillage Oral - Puree Delayed oral transit Oral - Mech Soft Delayed oral transit;Impaired mastication Oral - Regular -- Oral - Multi-Consistency -- Oral - Pill -- Oral Phase - Comment --  CHL IP PHARYNGEAL PHASE 09/21/2018 Pharyngeal Phase Impaired Pharyngeal- Pudding Teaspoon -- Pharyngeal -- Pharyngeal- Pudding Cup -- Pharyngeal -- Pharyngeal- Honey Teaspoon -- Pharyngeal -- Pharyngeal- Honey Cup -- Pharyngeal -- Pharyngeal- Nectar Teaspoon -- Pharyngeal -- Pharyngeal- Nectar Cup Penetration/Aspiration before swallow Pharyngeal Material enters airway,  remains ABOVE vocal cords then ejected out Pharyngeal- Nectar Straw Penetration/Aspiration before swallow Pharyngeal Material enters airway, CONTACTS cords and then ejected out Pharyngeal- Thin Teaspoon -- Pharyngeal -- Pharyngeal- Thin Cup Penetration/Aspiration before swallow Pharyngeal Material enters airway, passes BELOW cords without attempt by patient to eject out (silent aspiration) Pharyngeal- Thin Straw Penetration/Aspiration before swallow Pharyngeal Material enters airway, passes BELOW cords without attempt by patient to eject out (silent aspiration) Pharyngeal- Puree WFL Pharyngeal -- Pharyngeal- Mechanical Soft WFL Pharyngeal -- Pharyngeal- Regular -- Pharyngeal -- Pharyngeal- Multi-consistency -- Pharyngeal -- Pharyngeal- Pill -- Pharyngeal -- Pharyngeal Comment --  CHL IP CERVICAL ESOPHAGEAL PHASE 09/21/2018 Cervical Esophageal Phase WFL Pudding Teaspoon -- Pudding Cup -- Honey Teaspoon -- Honey Cup -- Nectar Teaspoon -- Nectar Cup -- Nectar Straw -- Thin Teaspoon -- Thin Cup -- Thin Straw -- Puree -- Mechanical Soft -- Regular -- Multi-consistency -- Pill -- Cervical Esophageal Comment -- Virl AxeLaura P Nix 09/21/2018, 11:23 AM  Ivar DrapeLaura Nix, M.A. CCC-SLP Acute Rehabilitation Services Pager [phone] Office [phone]             [place] Transcranial Doppler  Result Date: 09/22/2018  Transcranial Doppler Indications: Stroke. Comparison Study: No prior studies. Performing Technologist: Chanda BusingGregory Collins RVT  Examination Guidelines: A complete evaluation includes B-mode imaging, spectral Doppler, color Doppler, and power Doppler as needed of all accessible portions of each vessel. Bilateral testing is considered an integral part of a complete examination. Limited examinations for reoccurring indications may be performed as noted.  +----------+-------------+----------+-----------+------------------+ RIGHT TCD Right VM (cm)Depth (cm)Pulsatility     Comment        +----------+-------------+----------+-----------+------------------+ MCA                                         Unable to insonate +----------+-------------+----------+-----------+------------------+ ACA                                         Unable to insonate +----------+-------------+----------+-----------+------------------+ Term ICA      16.00                 1.31                       +----------+-------------+----------+-----------+------------------+ PCA           27.00                 0.88                       +----------+-------------+----------+-----------+------------------+ Opthalmic     20.00                 1.61                       +----------+-------------+----------+-----------+------------------+ ICA siphon  25.00                 1.66                       +----------+-------------+----------+-----------+------------------+ Vertebral    -23.00                 1.38                       +----------+-------------+----------+-----------+------------------+  +----------+------------+----------+-----------+-------+ LEFT TCD  Left VM (cm)Depth (cm)PulsatilityComment +----------+------------+----------+-----------+-------+ MCA          39.00                 1.44            +----------+------------+----------+-----------+-------+ ACA          -39.00                0.95            +----------+------------+----------+-----------+-------+ Term ICA     23.00                 1.04            +----------+------------+----------+-----------+-------+ PCA          23.00                 0.86            +----------+------------+----------+-----------+-------+ Opthalmic    17.00                 1.18            +----------+------------+----------+-----------+-------+ ICA siphon   16.00                 1.16            +----------+------------+----------+-----------+-------+ Vertebral    -28.00                1.18             +----------+------------+----------+-----------+-------+  +------------+-------+------------------+             VM cm/s     Comment       +------------+-------+------------------+ Prox Basilar-41.00        1.02        +------------+-------+------------------+ Dist Basilar       Unable to insonate +------------+-------+------------------+ Summary:  Absent right temporal window limits evaluation of anetrior circulation on right.Normal mean flow velocities in remaining identified vessles of anterior and posterior cerebral circulation.Globally elevated pulsaitity indices suggest diffus eintracranial atherosclerosis likely. *See table(s) above for measurements and observations.  Diagnosing physician: Delia HeadyPramod Zonnie Landen MD Electronically signed by Delia HeadyPramod Braedin Millhouse MD on 09/22/2018 at 1:02:41 PM.    Final    2D echocardiogram  1. The left ventricle has hyperdynamic systolic function, with an ejection fraction of >65%. The cavity size was normal. There is mildly increased left ventricular wall thickness. Left ventricular diastolic function could not be evaluated secondary to  atrial fibrillation.  2. The right ventricle has normal systolic function. The cavity was normal. There is no increase in right ventricular wall thickness.  3. Trivial pericardial effusion is present.  4. No evidence of mitral valve stenosis.  5. No stenosis of the aortic valve.  6. The interatrial septum was not assessed.   PHYSICAL EXAM Obese elderly African-American lady lying comfortably in bed. . Afebrile. Head is nontraumatic. Neck is supple without bruit.    Cardiac exam no  murmur or gallop. Lungs are clear to auscultation. Distal pulses are well felt. Neurological Exam :  Awake alert oriented to time and place.  She follows commands well..  Dysarthria.  Right gaze preference.  Unable to look to the left past midline.  Left homonymous hemianopsia and does not blink to threat on the left.  Moderate left lower facial weakness.   Tongue midline.  Dense left hemiplegia with 0/5 strength.  Purposeful antigravity movement on the right with good strength.  Sensation is diminished on the left compared to the right.  Left plantar is upgoing right is downgoing.  Gait not tested.  ASSESSMENT/PLAN Ms. Laverda PageBarbara C Brash is a 73 y.o. female with history of HTN, HLD, type II DVT found down on her floor x2 days.  Found to have a large right brain stroke and also in new A. fib with RVR.   Stroke:   Large R MCA infarct embolic secondary to new onset A. fib   CT head moderately large R MCA infarct with 3 mm left shift  CT CS no fracture TCD Absent right temporal window limits evaluation of anetrior circulation on right.Normal mean flow velocities in remaining identified vessles of anterior and posterior cerebral circulation.Globally elevated pulsaitity indices suggest diffus eintracranial   atherosclerosis likely  Carotid Doppler no significant stenosis.  Incidentally noted cardiac arrhythmia.  2D Echo EF >65%.  In atrial fibrillation.  LDL 121  HgbA1c 6.1  Add Lovenox 40 subcu daily for VTE prophylaxis  No antithrombotic prior to admission, now on aspirin 325 mg daily.  Continue aspirin for now.  Will need anticoagulation  In 3-5 days  Therapy recommendations: CIR  Disposition: Pending  Atrial Fibrillation w/ RVR, new onset  Home anticoagulation:  None   On Cardizem drip, will change to digoxin to avoid low blood pressure . Agree with plans for anticoagulation, consider in 5 to 7 days    Hypertension/hypotension  Stable 110s . Permissive hypertension (OK if < 220/120) but gradually normalize in 5-7 days . Long-term BP goal normotensive  Hyperlipidemia  Home meds: No statin  LDL 121, goal < 70  Add Lipitor 80 once LFTs stablize  Continue statin at discharge  Diabetes type II Controlled  HgbA1c 6.1, goal < 7.0  Dysphagia . Secondary to stroke . Cleared for dysphagia 1 nectar thick liquid diet after  modified barium . Speech on board   Other Stroke Risk Factors  Advanced age  Former cigarette smoker  Family hx stroke (mother)  Other Active Problems  Rhabdomyolysis, secondary to fall immediately for x2 days  Hyperbilirubinemia  Leukocytosis  Hypernatremia  Hospital day # 2  She has presented with a large right MCA infarct secondary to new onset atrial fibrillation and cardiogenic embolism.  Continue aspirin for now but she will need anticoagulation with Eliquis but after 3 to 5 days to reduce risk for hemorrhagic transformation.  Cardiology consult to help control heart rate.  Continue physical occupational therapy  And speech therapy  .  Discussed with Dr. Nelson ChimesAmin.  Likely transfer to inpatient rehab or SNF in the next few days when stable Delia HeadyPramod Loni Delbridge, MD Medical Director [facility] Pager: [phone] 09/22/2018 1:26 PM   To contact Stroke Continuity provider, please refer to [email or web address]. After hours, contact General Neurology

## 2018-09-22 NOTE — Progress Notes (Signed)
Progress Note  Patient Name: Rachel Fox Date of Encounter: 09/22/2018  Primary Cardiologist: Dina Rich, MD   Subjective   Speech continues to improve.  No chest pain no shortness of breath.  Asymptomatic with regards to her tachycardia  Inpatient Medications    Scheduled Meds:   stroke: mapping our early stages of recovery book   Does not apply Once   aspirin  300 mg Rectal Daily   Or   aspirin  325 mg Oral Daily   atorvastatin  80 mg Oral q1800   digoxin  0.125 mg Oral Daily   enoxaparin (LOVENOX) injection  40 mg Subcutaneous Q24H   insulin aspart  0-9 Units Subcutaneous TID WC   Continuous Infusions:  0.9 % NaCl with KCl 20 mEq / L Stopped (09/21/18 1312)   dextrose 5 % and 0.45% NaCl 75 mL/hr at 09/22/18 0400   PRN Meds: acetaminophen **OR** acetaminophen (TYLENOL) oral liquid 160 mg/5 mL **OR** acetaminophen, alum & mag hydroxide-simeth, guaiFENesin-dextromethorphan, hydrocortisone, hydrocortisone cream, ipratropium-albuterol, lip balm, loratadine, metoprolol tartrate, Muscle Rub, phenol, polyethylene glycol, polyvinyl alcohol, prochlorperazine, Resource ThickenUp Clear, senna-docusate, sodium chloride   Vital Signs    Vitals:   09/22/18 0235 09/22/18 0400 09/22/18 0408 09/22/18 0409  BP: (!) 129/103 103/65    Pulse: (!) 136  (!) 118 (!) 142  Resp: (!) 23     Temp:  98.2 F (36.8 C)    TempSrc:  Oral    SpO2: 98%     Weight:      Height:        Intake/Output Summary (Last 24 hours) at 09/22/2018 0806 Last data filed at 09/22/2018 0542 Gross per 24 hour  Intake 3922.11 ml  Output 1950 ml  Net 1972.11 ml   Last 3 Weights 09/20/2018 09/20/2018  Weight (lbs) 160 lb 7.9 oz 156 lb  Weight (kg) 72.8 kg 70.761 kg      Telemetry    Atrial fibrillation heart rates 120s to 140s- Personally Reviewed  ECG    Atrial fibrillation with rapid ventricular response- Personally Reviewed  Physical Exam   GEN: No acute distress.   Neck: No  JVD Cardiac:  Irregularly irregular, no murmurs, rubs, or gallops.  Respiratory: Clear to auscultation bilaterally. GI: Soft, nontender, non-distended  MS: No edema; No deformity. Neuro:   Improved speech, slight left-sided facial droop noted Psych: Normal affect   Labs    High Sensitivity Troponin:   Recent Labs  Lab 09/20/18 1142  TROPONINIHS 19.00*      Cardiac EnzymesNo results for input(s): TROPONINI in the last 168 hours. No results for input(s): TROPIPOC in the last 168 hours.   Chemistry Recent Labs  Lab 09/20/18 1130 09/20/18 1142 09/22/18 0313  NA 149* 146* 141  K 4.4 3.8 4.0  CL 112* 107 108  CO2  --  23 26  GLUCOSE 128* 132* 114*  BUN 34* 31* 12  CREATININE 1.00 1.04* 0.92  CALCIUM  --  10.4* 8.5*  PROT  --  8.5*  --   ALBUMIN  --  4.2  --   AST  --  63*  --   ALT  --  30  --   ALKPHOS  --  61  --   BILITOT  --  1.3*  --   GFRNONAA  --  54* >60  GFRAA  --  >60 >60  ANIONGAP  --  16* 7     Hematology Recent Labs  Lab 09/20/18 1130 09/20/18 1142  09/22/18 0313  WBC  --  14.0* 11.5*  RBC  --  5.81* 5.06  HGB 17.7* 15.5* 13.8  HCT 52.0* 49.2* 43.7  MCV  --  84.7 86.4  MCH  --  26.7 27.3  MCHC  --  31.5 31.6  RDW  --  14.9 13.8  PLT  --  207 175    BNP Recent Labs  Lab 09/20/18 1142  BNP 485.0*     DDimer No results for input(s): DDIMER in the last 168 hours.   Radiology    Dg Chest 1 View  Result Date: 09/20/2018 CLINICAL DATA:  Left side weak this EXAM: CHEST  1 VIEW COMPARISON:  Portable exam 1149 hours compared to 02/18/2008 FINDINGS: Enlargement of cardiac silhouette. Mediastinal contours and pulmonary vascularity normal. Lungs clear. No infiltrate, pleural effusion or pneumothorax. Bones demineralized. IMPRESSION: Enlargement of cardiac silhouette. No acute abnormalities. Electronically Signed   By: Ulyses SouthwardMark  Boles M.D.   On: 09/20/2018 12:20   Ct Head Wo Contrast  Result Date: 09/20/2018 CLINICAL DATA:  Left-sided weakness, facial  droop, and slurred speech. EXAM: CT HEAD WITHOUT CONTRAST CT CERVICAL SPINE WITHOUT CONTRAST TECHNIQUE: Multidetector CT imaging of the head and cervical spine was performed following the standard protocol without intravenous contrast. Multiplanar CT image reconstructions of the cervical spine were also generated. COMPARISON:  None. FINDINGS: CT HEAD FINDINGS Brain: A moderate-sized region of low density involving cortex and white matter in the right temporal, parietal, and frontal lobes and insula is consistent with a subacute infarct. There is also some right basal ganglia involvement. There is regional mass effect with slight effacement of the right lateral ventricle and 3 mm of leftward midline shift. No acute intracranial hemorrhage or extra-axial fluid collection is identified. They were is no age advanced atrophy. Vascular: Calcified atherosclerosis at the skull base. Left cavernous sinus gas, possibly related to vena puncture. Hyperdense appearance of the right MCA in the sylvian fissure in the area of infarct. Skull: No fracture or focal osseous lesion. Sinuses/Orbits: Visualized paranasal sinuses and mastoid air cells are clear. Orbits are unremarkable. Other: None. CT CERVICAL SPINE FINDINGS Alignment: Straightening/slight reversal of the normal cervical lordosis. No significant listhesis. Skull base and vertebrae: No acute fracture or suspicious osseous lesion. Soft tissues and spinal canal: No prevertebral fluid or swelling. No visible canal hematoma. Disc levels: Moderate cervical spondylosis. Multilevel neural foraminal stenosis due to uncovertebral spurring, moderate to severe bilaterally at C4-5. Upper chest: Clear lung apices. Other: None. IMPRESSION: 1. Moderately large subacute right MCA infarct with 3 mm of leftward midline shift. No hemorrhage. 2. No acute cervical spine fracture. Electronically Signed   By: Sebastian AcheAllen  Grady M.D.   On: 09/20/2018 12:44   Ct Cervical Spine Wo Contrast  Result  Date: 09/20/2018 CLINICAL DATA:  Left-sided weakness, facial droop, and slurred speech. EXAM: CT HEAD WITHOUT CONTRAST CT CERVICAL SPINE WITHOUT CONTRAST TECHNIQUE: Multidetector CT imaging of the head and cervical spine was performed following the standard protocol without intravenous contrast. Multiplanar CT image reconstructions of the cervical spine were also generated. COMPARISON:  None. FINDINGS: CT HEAD FINDINGS Brain: A moderate-sized region of low density involving cortex and white matter in the right temporal, parietal, and frontal lobes and insula is consistent with a subacute infarct. There is also some right basal ganglia involvement. There is regional mass effect with slight effacement of the right lateral ventricle and 3 mm of leftward midline shift. No acute intracranial hemorrhage or extra-axial fluid collection is identified. They  were is no age advanced atrophy. Vascular: Calcified atherosclerosis at the skull base. Left cavernous sinus gas, possibly related to vena puncture. Hyperdense appearance of the right MCA in the sylvian fissure in the area of infarct. Skull: No fracture or focal osseous lesion. Sinuses/Orbits: Visualized paranasal sinuses and mastoid air cells are clear. Orbits are unremarkable. Other: None. CT CERVICAL SPINE FINDINGS Alignment: Straightening/slight reversal of the normal cervical lordosis. No significant listhesis. Skull base and vertebrae: No acute fracture or suspicious osseous lesion. Soft tissues and spinal canal: No prevertebral fluid or swelling. No visible canal hematoma. Disc levels: Moderate cervical spondylosis. Multilevel neural foraminal stenosis due to uncovertebral spurring, moderate to severe bilaterally at C4-5. Upper chest: Clear lung apices. Other: None. IMPRESSION: 1. Moderately large subacute right MCA infarct with 3 mm of leftward midline shift. No hemorrhage. 2. No acute cervical spine fracture. Electronically Signed   By: Sebastian Ache M.D.   On:  09/20/2018 12:44   US Carotid Bilateral (at [facility] And [facility] Only)  Result Date: 09/21/2018 CLINICAL DATA:  CVA.  History of hypertension and diabetes. EXAM: BILATERAL CAROTID DUPLEX ULTRASOUND TECHNIQUE: Wallace Cullens scale imaging, color Doppler and duplex ultrasound were performed of bilateral carotid and vertebral arteries in the neck. COMPARISON:  None. FINDINGS: Criteria: Quantification of carotid stenosis is based on velocity parameters that correlate the residual internal carotid diameter with NASCET-based stenosis levels, using the diameter of the distal internal carotid lumen as the denominator for stenosis measurement. The following velocity measurements were obtained: RIGHT ICA: 35/7 cm/sec CCA: 73/9 cm/sec SYSTOLIC ICA/CCA RATIO:  0.5 ECA: 96 cm/sec LEFT ICA: 58/16 cm/sec CCA: 83/10 cm/sec SYSTOLIC ICA/CCA RATIO:  0.7 ECA: 52 cm/sec RIGHT CAROTID ARTERY: There is no grayscale evidence of significant intimal thickening or atherosclerotic plaque affecting the interrogated portions of the right carotid system. There are no elevated peak systolic velocities within the interrogated course of the right internal carotid artery to suggest a hemodynamically significant stenosis. RIGHT VERTEBRAL ARTERY:  Antegrade flow LEFT CAROTID ARTERY: There is a minimal amount of eccentric echogenic plaque within the left carotid bulb (images 45 and 47), extending to involve the origin and proximal aspects of the left internal carotid artery (image 56), not resulting in elevated peak systolic velocities within the interrogated course of the left internal carotid artery to suggest a hemodynamically significant stenosis. LEFT VERTEBRAL ARTERY:  Antegrade Flow Note is made of a apparent cardiac arrhythmia IMPRESSION: 1. Minimal amount of left-sided atherosclerotic plaque, not resulting in a hemodynamically significant stenosis. 2. Unremarkable sonographic evaluation of right carotid system. 3. Incidentally noted cardiac arrhythmia.  Further evaluation with ECG monitoring could be performed as indicated Electronically Signed   By: Simonne Come M.D.   On: 09/21/2018 03:18   Dg Swallowing Func-speech Pathology  Result Date: 09/21/2018 Objective Swallowing Evaluation: Type of Study: MBS-Modified Barium Swallow Study  Patient Details Name: MERLINE PERKIN MRN: [id number] Date of Birth: Feb 18, 1946 Today's Date: 09/21/2018 Time: SLP Start Time (ACUTE ONLY): 1020 -SLP Stop Time (ACUTE ONLY): 1039 SLP Time Calculation (min) (ACUTE ONLY): 19 min Past Medical History: Past Medical History: Diagnosis Date  High cholesterol   Hypertension   Type 2 diabetes mellitus (HCC)  Past Surgical History: Past Surgical History: Procedure Laterality Date  ABDOMINAL HYSTERECTOMY    TUBAL LIGATION   HPI: 73 year old with history of atrial fibrillation, diabetes mellitus type 2, essential hypertension who was brought to the hospital for evaluation of fall.  She was found to be in mild rhabdomyolysis, atrial fibrillation  with RVR and also a large right-sided MCA CVA on the CT of the head  Subjective: alert and asking for water Assessment / Plan / Recommendation CHL IP CLINICAL IMPRESSIONS 09/21/2018 Clinical Impression Pt presents with a mild to moderate dysphagia with primarily oral deficits. She has left-sided labial and lingual weakness that results in anterior and posterior spillage of boluses, but with improved containment and control with use of straw. Premature spillage of thin liquids is penetrated and silently aspirated in small quantities. Penetration with nectar thick liquids is trace, shallow, and clears spontaneously either as she completes the swallow or with a reflexive throat clear. Nectar thick liquids did reach the true vocal cords x1 but was still ejected. This occurred in the setting of inability to fully clear soft solids from her oral cavity, with pt needing a liquid wash to assist. Her posterior oral transit was delayed with all solids but more  efficient with purees. Recommend starting Dys 1 diet and nectar thick liquids with additional SLP f/u to maximize swallow function and safety. SLP Visit Diagnosis Dysphagia, oropharyngeal phase (R13.12) Attention and concentration deficit following -- Frontal lobe and executive function deficit following -- Impact on safety and function Mild aspiration risk   CHL IP TREATMENT RECOMMENDATION 09/21/2018 Treatment Recommendations Therapy as outlined in treatment plan below   Prognosis 09/21/2018 Prognosis for Safe Diet Advancement Good Barriers to Reach Goals -- Barriers/Prognosis Comment -- CHL IP DIET RECOMMENDATION 09/21/2018 SLP Diet Recommendations Dysphagia 1 (Puree) solids;Nectar thick liquid Liquid Administration via Straw Medication Administration Crushed with puree Compensations Slow rate;Small sips/bites;Monitor for anterior loss;Lingual sweep for clearance of pocketing Postural Changes Seated upright at 90 degrees   CHL IP OTHER RECOMMENDATIONS 09/21/2018 Recommended Consults -- Oral Care Recommendations Oral care BID Other Recommendations Order thickener from pharmacy;Prohibited food (jello, ice cream, thin soups);Remove water pitcher   CHL IP FOLLOW UP RECOMMENDATIONS 09/21/2018 Follow up Recommendations Inpatient Rehab   CHL IP FREQUENCY AND DURATION 09/21/2018 Speech Therapy Frequency (ACUTE ONLY) min 2x/week Treatment Duration 2 weeks      CHL IP ORAL PHASE 09/21/2018 Oral Phase Impaired Oral - Pudding Teaspoon -- Oral - Pudding Cup -- Oral - Honey Teaspoon -- Oral - Honey Cup -- Oral - Nectar Teaspoon -- Oral - Nectar Cup Premature spillage Oral - Nectar Straw Premature spillage Oral - Thin Teaspoon -- Oral - Thin Cup Premature spillage Oral - Thin Straw Premature spillage Oral - Puree Delayed oral transit Oral - Mech Soft Delayed oral transit;Impaired mastication Oral - Regular -- Oral - Multi-Consistency -- Oral - Pill -- Oral Phase - Comment --  CHL IP PHARYNGEAL PHASE 09/21/2018 Pharyngeal Phase Impaired  Pharyngeal- Pudding Teaspoon -- Pharyngeal -- Pharyngeal- Pudding Cup -- Pharyngeal -- Pharyngeal- Honey Teaspoon -- Pharyngeal -- Pharyngeal- Honey Cup -- Pharyngeal -- Pharyngeal- Nectar Teaspoon -- Pharyngeal -- Pharyngeal- Nectar Cup Penetration/Aspiration before swallow Pharyngeal Material enters airway, remains ABOVE vocal cords then ejected out Pharyngeal- Nectar Straw Penetration/Aspiration before swallow Pharyngeal Material enters airway, CONTACTS cords and then ejected out Pharyngeal- Thin Teaspoon -- Pharyngeal -- Pharyngeal- Thin Cup Penetration/Aspiration before swallow Pharyngeal Material enters airway, passes BELOW cords without attempt by patient to eject out (silent aspiration) Pharyngeal- Thin Straw Penetration/Aspiration before swallow Pharyngeal Material enters airway, passes BELOW cords without attempt by patient to eject out (silent aspiration) Pharyngeal- Puree WFL Pharyngeal -- Pharyngeal- Mechanical Soft WFL Pharyngeal -- Pharyngeal- Regular -- Pharyngeal -- Pharyngeal- Multi-consistency -- Pharyngeal -- Pharyngeal- Pill -- Pharyngeal -- Pharyngeal Comment --  CHL IP  CERVICAL ESOPHAGEAL PHASE 09/21/2018 Cervical Esophageal Phase WFL Pudding Teaspoon -- Pudding Cup -- Honey Teaspoon -- Honey Cup -- Nectar Teaspoon -- Nectar Cup -- Nectar Straw -- Thin Teaspoon -- Thin Cup -- Thin Straw -- Puree -- Mechanical Soft -- Regular -- Multi-consistency -- Pill -- Cervical Esophageal Comment -- Virl Axe Nix 09/21/2018, 11:23 AM  Ivar Drape, M.A. CCC-SLP Acute Rehabilitation Services Pager [phone] Office [phone]             Vas [place] Transcranial Doppler  Result Date: 09/21/2018  Transcranial Doppler Indications: Stroke. Comparison Study: No prior studies. Performing Technologist: Chanda Busing RVT  Examination Guidelines: A complete evaluation includes B-mode imaging, spectral Doppler, color Doppler, and power Doppler as needed of all accessible portions of each vessel. Bilateral testing is  considered an integral part of a complete examination. Limited examinations for reoccurring indications may be performed as noted.  +----------+-------------+----------+-----------+------------------+  RIGHT TCD  Right VM (cm) Depth (cm) Pulsatility      Comment        +----------+-------------+----------+-----------+------------------+  MCA                                             Unable to insonate  +----------+-------------+----------+-----------+------------------+  ACA                                             Unable to insonate  +----------+-------------+----------+-----------+------------------+  Term ICA       16.00                   1.31                         +----------+-------------+----------+-----------+------------------+  PCA            27.00                   0.88                         +----------+-------------+----------+-----------+------------------+  Opthalmic      20.00                   1.61                         +----------+-------------+----------+-----------+------------------+  ICA siphon     25.00                   1.66                         +----------+-------------+----------+-----------+------------------+  Vertebral     -23.00                   1.38                         +----------+-------------+----------+-----------+------------------+  +----------+------------+----------+-----------+-------+  LEFT TCD   Left VM (cm) Depth (cm) Pulsatility Comment  +----------+------------+----------+-----------+-------+  MCA           39.00                   1.44              +----------+------------+----------+-----------+-------+  ACA           -39.00                  0.95              +----------+------------+----------+-----------+-------+  Term ICA      23.00                   1.04              +----------+------------+----------+-----------+-------+  PCA           23.00                   0.86              +----------+------------+----------+-----------+-------+  Opthalmic      17.00                   1.18              +----------+------------+----------+-----------+-------+  ICA siphon    16.00                   1.16              +----------+------------+----------+-----------+-------+  Vertebral     -28.00                  1.18              +----------+------------+----------+-----------+-------+  +------------+-------+------------------+               VM cm/s      Comment        +------------+-------+------------------+  Prox Basilar -41.00         1.02         +------------+-------+------------------+  Dist Basilar         Unable to insonate  +------------+-------+------------------+    Preliminary     Cardiac Studies   Echocardiogram 09/21/2018:   1. The left ventricle has hyperdynamic systolic function, with an ejection fraction of >65%. The cavity size was normal. There is mildly increased left ventricular wall thickness. Left ventricular diastolic function could not be evaluated secondary to  atrial fibrillation.  2. The right ventricle has normal systolic function. The cavity was normal. There is no increase in right ventricular wall thickness.  3. Trivial pericardial effusion is present.  4. No evidence of mitral valve stenosis.  5. No stenosis of the aortic valve.  6. The interatrial septum was not assessed.  Patient Profile     [age] y.o. female with persistent atrial fibrillation, normal EF, stroke  Assessment & Plan   Persistent atrial fibrillation -CHA2DS2-VASc = 4 (DM, HTN, female, age 6>65) - Continuing with digoxin now 0.125 mcg once a day.  Still challenging with rate control. - Hypotension is not allowing for beta-blocker or calcium channel blocker. - I will add amiodarone 200 mg p.o. twice daily, gentle load to assist with rate control in the setting of hypotension.  Chronic anticoagulation - Defer timing to neurology.  Recent stroke.  Eliquis would be reasonable when able.  Hopefully this will occur 3 to 5 days after stroke.  Reduce risk for  hemorrhagic transformation.  Diabetes with hyperlipidemia - Statin.  Added atorvastatin 80 mg once a day.  Mildly elevated high-sensitivity troponin - In the setting of tachycardia as well as stroke, demand ischemia.  For questions or updates, please contact [facility] Please consult [email or web address] for contact info under  Signed, Donato SchultzMark Mak Bonny, MD  09/22/2018, 8:06 AM

## 2018-09-22 NOTE — Progress Notes (Signed)
Physical Therapy Treatment Patient Details Name: Rachel Fox MRN: [id number] DOB: 1945/05/13 Today's Date: 09/22/2018    History of Present Illness Pt is a 73 y/o female with a PMH significant for HTN, DM. She presents after being found on the floor after ~2 days. CT revealed moderate to large R-sided subacute MCA infarct. Pt was also found to be in a-fib with RVR and with mild rhabdomyolitis.     PT Comments    Pt was able to participate in therapy today with HRs ranging from 110-147 during our mobility session.  She tolerated EOB for >20 mins working on midline sitting balance and correction of left lateral lean.  She stood multiple times with two person assist with our most successful stands using the steady standing frame.  I think pt would benefit from post acute intensive multi disciplinary rehab at discharge.  PT will continue to follow acutely for safe mobility progression.   Follow Up Recommendations  CIR     Equipment Recommendations  Wheelchair (measurements PT);Wheelchair cushion (measurements PT);3in1 (PT);Hospital bed    Recommendations for Other Services Rehab consult     Precautions / Restrictions Precautions Precautions: Fall Precaution Comments: L side weak/flaccid, right gaze preference.     Mobility  Bed Mobility Overal bed mobility: Needs Assistance Bed Mobility: Supine to Sit;Sit to Supine     Supine to sit: HOB elevated;Mod assist Sit to supine: +2 for physical assistance;Mod assist   General bed mobility comments: Mod assist to support trunk and help move left leg to EOB as well as protect left arm during transitions up.  Verbal and tactile cues for sequencing and hand placement on bed rail on her left side (inattentive side).    Transfers Overall transfer level: Needs assistance   Transfers: Sit to/from Stand Sit to Stand: +2 physical assistance;Max assist;Mod assist         General transfer comment: Two person max assist from elevated bed  without the steady, mod assist with the steady, however, there were no maxi move lift pads in the storage area and I did not feel safe getting her to the chair without a total lift pad as I think she would fatigue and be increadibly hard to get back into the bed later this evening, so for this reason RN and I returned her to supine.  I did ask the Teacher, music to order more lift pads.    Ambulation/Gait             General Gait Details: unable at this time.        Modified Rankin (Stroke Patients Only) Modified Rankin (Stroke Patients Only) Pre-Morbid Rankin Score: No symptoms Modified Rankin: Severe disability     Balance Overall balance assessment: Needs assistance Sitting-balance support: Feet supported;No upper extremity supported;Single extremity supported Sitting balance-Leahy Scale: Poor Sitting balance - Comments: as much as max assist and as little as close supervision for 15-20 seconds.  Cues for correction of left lateral lean and pt able to kick in some trunk stability to help correct without purely pulling with her right arm.   Postural control: Left lateral lean Standing balance support: Single extremity supported Standing balance-Leahy Scale: Zero Standing balance comment: mod to max assist to support trunk for both powering up and for blocking of the left knee in standing.  She did much better and was able to get more fully upright when steady standing frame was used.  Cognition Arousal/Alertness: Awake/alert Behavior During Therapy: WFL for tasks assessed/performed Overall Cognitive Status: Impaired/Different from baseline Area of Impairment: Attention;Following commands;Safety/judgement;Awareness;Problem solving                   Current Attention Level: Sustained   Following Commands: Follows one step commands consistently(on her R side) Safety/Judgement: Decreased awareness of safety;Decreased awareness of  deficits Awareness: Emergent Problem Solving: Difficulty sequencing;Requires verbal cues;Requires tactile cues General Comments: Pt can tell me that her right side is her strong side, L is weak and when I give her the space to fall over in sitting she can tell me she is falling to the left and can help self correct with repetition.          General Comments General comments (skin integrity, edema, etc.): HR ranged from 110-147 during session.  BP stable, O2 stable on RA      Pertinent Vitals/Pain Pain Assessment: Faces Faces Pain Scale: Hurts even more Pain Location: L arm and leg, head Pain Descriptors / Indicators: Grimacing;Guarding Pain Intervention(s): Limited activity within patient's tolerance;Monitored during session;Repositioned;RN gave pain meds during session           PT Goals (current goals can now be found in the care plan section) Acute Rehab PT Goals Patient Stated Goal: pt wants to get up OOB Progress towards PT goals: Progressing toward goals    Frequency    Min 4X/week      PT Plan Discharge plan needs to be updated       AM-PAC PT "6 Clicks" Mobility   Outcome Measure  Help needed turning from your back to your side while in a flat bed without using bedrails?: Total Help needed moving from lying on your back to sitting on the side of a flat bed without using bedrails?: A Lot Help needed moving to and from a bed to a chair (including a wheelchair)?: Total Help needed standing up from a chair using your arms (e.g., wheelchair or bedside chair)?: Total Help needed to walk in hospital room?: Total Help needed climbing 3-5 steps with a railing? : Total 6 Click Score: 7    End of Session Equipment Utilized During Treatment: Gait belt Activity Tolerance: Patient limited by pain;Patient limited by fatigue Patient left: in bed;with call bell/phone within reach;with bed alarm set;Other (comment)(bed in chair mode) Nurse Communication: Mobility  status;Patient requests pain meds(RN in room at end of session and assisted in stand) PT Visit Diagnosis: Other symptoms and signs involving the nervous system (R29.898);Hemiplegia and hemiparesis Hemiplegia - Right/Left: Left Hemiplegia - dominant/non-dominant: Non-dominant Hemiplegia - caused by: Cerebral infarction     Time: [id number] PT Time Calculation (min) (ACUTE ONLY): 33 min  Charges:  $Therapeutic Activity: 8-22 mins $Neuromuscular Re-education: 8-22 mins            Tavaras Goody B. Brailynn Breth, PT, DPT  Acute Rehabilitation [phone] pager [phone] office  @ [facility] [place]: [phone]             09/22/2018, 5:37 PM

## 2018-09-22 NOTE — Progress Notes (Signed)
PROGRESS NOTE    Rachel Fox  ZOX:[id number] DOB: Jul 27, 1945 DOA: 09/20/2018 PCP: Benita Stabile, MD   Brief Narrative:  73 year old with history of atrial fibrillation, diabetes mellitus type 2, essential hypertension who was brought to the hospital for evaluation of fall.  She was found to be in mild rhabdomyolysis, atrial fibrillation with RVR and also a large right-sided CVA on the CT of the head.  Neurology consulted who recommended starting Eliquis 3 to 5 days after stroke.  Patient was also seen by cardiology due to uncontrolled atrial fibrillation with RVR, digoxin and amiodarone were added.  Physical therapy recommended SNF   Assessment & Plan:   Principal Problem:   Cerebrovascular accident (CVA) with involvement of left side of body (HCC) Active Problems:   Atrial fibrillation with RVR (HCC)   Hypertension   Rhabdomyolysis   Type 2 diabetes mellitus (HCC)   Hyperlipidemia   Hyperbilirubinemia   Leukocytosis   Hypernatremia   Pressure injury of skin  Moderate to large right-sided acute MCA stroke -MRI brain without contrast and MRA head and neck without contrast (given deranged renal function) -Maintain Euthermia.  -ASA ordered.  -Echocardiogram-ejection fraction 65% in atrial fibrillation -LDL 121, Lipitor 80 mg daily.  Hemoglobin A1c 6.1. -Frequent neuro checks -Atorvastatin. -Risk factor modification -Neurology team following -PT/OT eval, Speech consult-appreciate input -Hold off on full dose of anticoagulation for at least 3-5 days for atrial fibrillation.  Likely should be restarted on 09/24/2018  Atrial fibrillation with RVR, HR in 140s - Currently on aspirin only, delaying anticoagulation use - Getting PRN IV Cardizem and Lopressor with minimal improvement.  Cardiology has added digoxin and amiodarone.  Mild rhabdomyolysis -Gentle hydration.  Hypernatremia, resolved -Likely from dehydration.  Continue to follow sodium levels.  Diabetes mellitus type  2 - Metformin on hold.  Accu-Chek sliding scale  Essential hypertension -Micardis and amlodipine on hold due to permissive hypertension.   DVT prophylaxis: SCDs Code Status: Full code none Family Communication: None at bedside Disposition Plan: Maintain hospital stay until heart rate is better. Consultants:   Cardiology  Neurology  Procedures:   None  Antimicrobials:   None   Subjective: Heart rate mostly persisting between 110-150 in atrial fibrillation. Seen and examined at bedside this morning and she tells me her speech is little better but still feels somewhat weak overall.  Review of Systems Otherwise negative except as per HPI, including: General = no fevers, chills, dizziness, malaise, fatigue HEENT/EYES = negative for pain, redness, loss of vision, double vision, blurred vision, loss of hearing, sore throat, hoarseness, dysphagia Cardiovascular= negative for chest pain, palpitation, murmurs, lower extremity swelling Respiratory/lungs= negative for shortness of breath, cough, hemoptysis, wheezing, mucus production Gastrointestinal= negative for nausea, vomiting,, abdominal pain, melena, hematemesis Genitourinary= negative for Dysuria, Hematuria, Change in Urinary Frequency MSK = Negative for arthralgia, myalgias, Back Pain, Joint swelling  Neurology= Negative for headache, seizures, numbness, tingling  Psychiatry= Negative for anxiety, depression, suicidal and homocidal ideation Allergy/Immunology= Medication/Food allergy as listed  Skin= Negative for Rash, lesions, ulcers, itching   Objective: Vitals:   09/22/18 0408 09/22/18 0409 09/22/18 0753 09/22/18 0915  BP:   117/68   Pulse: (!) 118 (!) 142 76 (!) 136  Resp:   13   Temp:   98.1 F (36.7 C)   TempSrc:   Oral   SpO2:   100%   Weight:      Height:        Intake/Output Summary (Last 24 hours) at 09/22/2018  1103 Last data filed at 09/22/2018 0946 Gross per 24 hour  Intake 4040.11 ml  Output 1950 ml   Net 2090.11 ml   Filed Weights   09/20/18 1100 09/20/18 1942  Weight: 70.8 kg 72.8 kg    Examination:  Constitutional: NAD, calm, comfortable Eyes: PERRL, lids and conjunctivae normal ENMT: Mucous membranes are moist. Posterior pharynx clear of any exudate or lesions.Normal dentition.  Neck: normal, supple, no masses, no thyromegaly Respiratory: clear to auscultation bilaterally, no wheezing, no crackles. Normal respiratory effort. No accessory muscle use.  Cardiovascular: Regular rate and rhythm, no murmurs / rubs / gallops. No extremity edema. 2+ pedal pulses. No carotid bruits.  Abdomen: no tenderness, no masses palpated. No hepatosplenomegaly. Bowel sounds positive.  Musculoskeletal: no clubbing / cyanosis. No joint deformity upper and lower extremities. Good ROM, no contractures. Normal muscle tone.  Skin: no rashes, lesions, ulcers. No induration Neurologic: CN 2-12 grossly intact. Sensation intact, DTR normal.  Significantly reduced strength in her left upper and lower extremity- 1/5.  Right upper and lower extremity strength 4+/5.  Left-sided facial droop and asymmetry noted Psychiatric: Normal judgment and insight. Alert and oriented x 3. Normal mood.    Data Reviewed:   CBC: Recent Labs  Lab 09/20/18 1130 09/20/18 1142 09/22/18 0313  WBC  --  14.0* 11.5*  NEUTROABS  --  11.5*  --   HGB 17.7* 15.5* 13.8  HCT 52.0* 49.2* 43.7  MCV  --  84.7 86.4  PLT  --  207 175   Basic Metabolic Panel: Recent Labs  Lab 09/20/18 1130 09/20/18 1142 09/22/18 0313  NA 149* 146* 141  K 4.4 3.8 4.0  CL 112* 107 108  CO2  --  23 26  GLUCOSE 128* 132* 114*  BUN 34* 31* 12  CREATININE 1.00 1.04* 0.92  CALCIUM  --  10.4* 8.5*  MG  --  2.3 1.9  PHOS  --  2.7  --    GFR: Estimated Creatinine Clearance: 57.3 mL/min (by C-G formula based on SCr of 0.92 mg/dL). Liver Function Tests: Recent Labs  Lab 09/20/18 1142  AST 63*  ALT 30  ALKPHOS 61  BILITOT 1.3*  PROT 8.5*   ALBUMIN 4.2   No results for input(s): LIPASE, AMYLASE in the last 168 hours. No results for input(s): AMMONIA in the last 168 hours. Coagulation Profile: Recent Labs  Lab 09/20/18 1142  INR 1.1   Cardiac Enzymes: Recent Labs  Lab 09/20/18 1142 09/21/18 0525 09/22/18 0313  CKTOTAL 2,651* 2,883* 1,637*   BNP (last 3 results) No results for input(s): PROBNP in the last 8760 hours. HbA1C: Recent Labs    09/21/18 1120  HGBA1C 6.1*   CBG: Recent Labs  Lab 09/21/18 0800 09/21/18 1146 09/21/18 1639 09/21/18 2203 09/22/18 0607  GLUCAP 109* 128* 143* 113* 111*   Lipid Profile: Recent Labs    09/21/18 1120  CHOL 184  HDL 42  LDLCALC 121*  TRIG 107  CHOLHDL 4.4   Thyroid Function Tests: Recent Labs    09/21/18 1120  TSH 1.655   Anemia Panel: Recent Labs    09/21/18 1120  VITAMINB12 1,052*   Sepsis Labs: No results for input(s): PROCALCITON, LATICACIDVEN in the last 168 hours.  Recent Results (from the past 240 hour(s))  SARS Coronavirus 2 (CEPHEID - Performed in [facility] lab), Hosp Order     Status: None   Collection Time: 09/20/18 11:10 AM   Specimen: Nasopharyngeal Swab  Result Value Ref Range Status  SARS Coronavirus 2 NEGATIVE NEGATIVE Final    Comment: (NOTE) If result is NEGATIVE SARS-CoV-2 target nucleic acids are NOT DETECTED. The SARS-CoV-2 RNA is generally detectable in upper and lower  respiratory specimens during the acute phase of infection. The lowest  concentration of SARS-CoV-2 viral copies this assay can detect is 250  copies / mL. A negative result does not preclude SARS-CoV-2 infection  and should not be used as the sole basis for treatment or other  patient management decisions.  A negative result may occur with  improper specimen collection / handling, submission of specimen other  than nasopharyngeal swab, presence of viral mutation(s) within the  areas targeted by this assay, and inadequate number of viral copies   (<250 copies / mL). A negative result must be combined with clinical  observations, patient history, and epidemiological information. If result is POSITIVE SARS-CoV-2 target nucleic acids are DETECTED. The SARS-CoV-2 RNA is generally detectable in upper and lower  respiratory specimens dur ing the acute phase of infection.  Positive  results are indicative of active infection with SARS-CoV-2.  Clinical  correlation with patient history and other diagnostic information is  necessary to determine patient infection status.  Positive results do  not rule out bacterial infection or co-infection with other viruses. If result is PRESUMPTIVE POSTIVE SARS-CoV-2 nucleic acids MAY BE PRESENT.   A presumptive positive result was obtained on the submitted specimen  and confirmed on repeat testing.  While 2019 novel coronavirus  (SARS-CoV-2) nucleic acids may be present in the submitted sample  additional confirmatory testing may be necessary for epidemiological  and / or clinical management purposes  to differentiate between  SARS-CoV-2 and other Sarbecovirus currently known to infect humans.  If clinically indicated additional testing with an alternate test  methodology [phone]) is advised. The SARS-CoV-2 RNA is generally  detectable in upper and lower respiratory sp ecimens during the acute  phase of infection. The expected result is Negative. Fact Sheet for Patients:  BoilerBrush.com.cy Fact Sheet for Healthcare Providers: https://pope.com/ This test is not yet approved or cleared by the [place] FDA and has been authorized for detection and/or diagnosis of SARS-CoV-2 by FDA under an Emergency Use Authorization (EUA).  This EUA will remain in effect (meaning this test can be used) for the duration of the COVID-19 declaration under Section 564(b)(1) of the Act, 21 U.S.C. section 360bbb-3(b)(1), unless the authorization is terminated  or revoked sooner. Performed at [facility], [address] [place], [place] [address]   Urine culture     Status: None   Collection Time: 09/20/18 12:46 PM   Specimen: Urine, Clean Catch  Result Value Ref Range Status   Specimen Description   Final    URINE, CLEAN CATCH Performed at [facility], [address] [place], [place] [address]    Special Requests   Final    NONE Performed at [facility], [address], [place], [place] [address]    Culture   Final    Multiple bacterial morphotypes present, none predominant. Suggest appropriate recollection if clinically indicated.   Report Status 09/21/2018 FINAL  Final         Radiology Studies: Dg Chest 1 View  Result Date: 09/20/2018 CLINICAL DATA:  Left side weak this EXAM: CHEST  1 VIEW COMPARISON:  Portable exam 1149 hours compared to 02/18/2008 FINDINGS: Enlargement of cardiac silhouette. Mediastinal contours and pulmonary vascularity normal. Lungs clear. No infiltrate, pleural effusion or pneumothorax. Bones demineralized. IMPRESSION: Enlargement of cardiac silhouette.  No acute abnormalities. Electronically Signed   By: Ulyses Southward M.D.   On: 09/20/2018 12:20   Ct Head Wo Contrast  Result Date: 09/20/2018 CLINICAL DATA:  Left-sided weakness, facial droop, and slurred speech. EXAM: CT HEAD WITHOUT CONTRAST CT CERVICAL SPINE WITHOUT CONTRAST TECHNIQUE: Multidetector CT imaging of the head and cervical spine was performed following the standard protocol without intravenous contrast. Multiplanar CT image reconstructions of the cervical spine were also generated. COMPARISON:  None. FINDINGS: CT HEAD FINDINGS Brain: A moderate-sized region of low density involving cortex and white matter in the right temporal, parietal, and frontal lobes and insula is consistent with a subacute infarct. There is also some right basal ganglia involvement. There is regional mass effect with slight effacement of the right lateral ventricle and 3 mm of  leftward midline shift. No acute intracranial hemorrhage or extra-axial fluid collection is identified. They were is no age advanced atrophy. Vascular: Calcified atherosclerosis at the skull base. Left cavernous sinus gas, possibly related to vena puncture. Hyperdense appearance of the right MCA in the sylvian fissure in the area of infarct. Skull: No fracture or focal osseous lesion. Sinuses/Orbits: Visualized paranasal sinuses and mastoid air cells are clear. Orbits are unremarkable. Other: None. CT CERVICAL SPINE FINDINGS Alignment: Straightening/slight reversal of the normal cervical lordosis. No significant listhesis. Skull base and vertebrae: No acute fracture or suspicious osseous lesion. Soft tissues and spinal canal: No prevertebral fluid or swelling. No visible canal hematoma. Disc levels: Moderate cervical spondylosis. Multilevel neural foraminal stenosis due to uncovertebral spurring, moderate to severe bilaterally at C4-5. Upper chest: Clear lung apices. Other: None. IMPRESSION: 1. Moderately large subacute right MCA infarct with 3 mm of leftward midline shift. No hemorrhage. 2. No acute cervical spine fracture. Electronically Signed   By: Sebastian Ache M.D.   On: 09/20/2018 12:44   Ct Cervical Spine Wo Contrast  Result Date: 09/20/2018 CLINICAL DATA:  Left-sided weakness, facial droop, and slurred speech. EXAM: CT HEAD WITHOUT CONTRAST CT CERVICAL SPINE WITHOUT CONTRAST TECHNIQUE: Multidetector CT imaging of the head and cervical spine was performed following the standard protocol without intravenous contrast. Multiplanar CT image reconstructions of the cervical spine were also generated. COMPARISON:  None. FINDINGS: CT HEAD FINDINGS Brain: A moderate-sized region of low density involving cortex and white matter in the right temporal, parietal, and frontal lobes and insula is consistent with a subacute infarct. There is also some right basal ganglia involvement. There is regional mass effect with  slight effacement of the right lateral ventricle and 3 mm of leftward midline shift. No acute intracranial hemorrhage or extra-axial fluid collection is identified. They were is no age advanced atrophy. Vascular: Calcified atherosclerosis at the skull base. Left cavernous sinus gas, possibly related to vena puncture. Hyperdense appearance of the right MCA in the sylvian fissure in the area of infarct. Skull: No fracture or focal osseous lesion. Sinuses/Orbits: Visualized paranasal sinuses and mastoid air cells are clear. Orbits are unremarkable. Other: None. CT CERVICAL SPINE FINDINGS Alignment: Straightening/slight reversal of the normal cervical lordosis. No significant listhesis. Skull base and vertebrae: No acute fracture or suspicious osseous lesion. Soft tissues and spinal canal: No prevertebral fluid or swelling. No visible canal hematoma. Disc levels: Moderate cervical spondylosis. Multilevel neural foraminal stenosis due to uncovertebral spurring, moderate to severe bilaterally at C4-5. Upper chest: Clear lung apices. Other: None. IMPRESSION: 1. Moderately large subacute right MCA infarct with 3 mm of leftward midline shift. No hemorrhage. 2. No acute cervical spine fracture. Electronically Signed  By: Sebastian AcheAllen  Grady M.D.   On: 09/20/2018 12:44   [place] Carotid Bilateral (at [facility] And [facility] Only)  Result Date: 09/21/2018 CLINICAL DATA:  CVA.  History of hypertension and diabetes. EXAM: BILATERAL CAROTID DUPLEX ULTRASOUND TECHNIQUE: Wallace CullensGray scale imaging, color Doppler and duplex ultrasound were performed of bilateral carotid and vertebral arteries in the neck. COMPARISON:  None. FINDINGS: Criteria: Quantification of carotid stenosis is based on velocity parameters that correlate the residual internal carotid diameter with NASCET-based stenosis levels, using the diameter of the distal internal carotid lumen as the denominator for stenosis measurement. The following velocity measurements were obtained: RIGHT ICA: 35/7  cm/sec CCA: 73/9 cm/sec SYSTOLIC ICA/CCA RATIO:  0.5 ECA: 96 cm/sec LEFT ICA: 58/16 cm/sec CCA: 83/10 cm/sec SYSTOLIC ICA/CCA RATIO:  0.7 ECA: 52 cm/sec RIGHT CAROTID ARTERY: There is no grayscale evidence of significant intimal thickening or atherosclerotic plaque affecting the interrogated portions of the right carotid system. There are no elevated peak systolic velocities within the interrogated course of the right internal carotid artery to suggest a hemodynamically significant stenosis. RIGHT VERTEBRAL ARTERY:  Antegrade flow LEFT CAROTID ARTERY: There is a minimal amount of eccentric echogenic plaque within the left carotid bulb (images 45 and 47), extending to involve the origin and proximal aspects of the left internal carotid artery (image 56), not resulting in elevated peak systolic velocities within the interrogated course of the left internal carotid artery to suggest a hemodynamically significant stenosis. LEFT VERTEBRAL ARTERY:  Antegrade Flow Note is made of a apparent cardiac arrhythmia IMPRESSION: 1. Minimal amount of left-sided atherosclerotic plaque, not resulting in a hemodynamically significant stenosis. 2. Unremarkable sonographic evaluation of right carotid system. 3. Incidentally noted cardiac arrhythmia. Further evaluation with ECG monitoring could be performed as indicated Electronically Signed   By: Simonne ComeJohn  Watts M.D.   On: 09/21/2018 03:18   Dg Swallowing Func-speech Pathology  Result Date: 09/21/2018 Objective Swallowing Evaluation: Type of Study: MBS-Modified Barium Swallow Study  Patient Details Name: Laverda PageBarbara C Hickson MRN: [id number] Date of Birth: Nov 06, 1945 Today's Date: 09/21/2018 Time: SLP Start Time (ACUTE ONLY): 1020 -SLP Stop Time (ACUTE ONLY): 1039 SLP Time Calculation (min) (ACUTE ONLY): 19 min Past Medical History: Past Medical History: Diagnosis Date  High cholesterol   Hypertension   Type 2 diabetes mellitus (HCC)  Past Surgical History: Past Surgical History: Procedure  Laterality Date  ABDOMINAL HYSTERECTOMY    TUBAL LIGATION   HPI: 73 year old with history of atrial fibrillation, diabetes mellitus type 2, essential hypertension who was brought to the hospital for evaluation of fall.  She was found to be in mild rhabdomyolysis, atrial fibrillation with RVR and also a large right-sided MCA CVA on the CT of the head  Subjective: alert and asking for water Assessment / Plan / Recommendation CHL IP CLINICAL IMPRESSIONS 09/21/2018 Clinical Impression Pt presents with a mild to moderate dysphagia with primarily oral deficits. She has left-sided labial and lingual weakness that results in anterior and posterior spillage of boluses, but with improved containment and control with use of straw. Premature spillage of thin liquids is penetrated and silently aspirated in small quantities. Penetration with nectar thick liquids is trace, shallow, and clears spontaneously either as she completes the swallow or with a reflexive throat clear. Nectar thick liquids did reach the true vocal cords x1 but was still ejected. This occurred in the setting of inability to fully clear soft solids from her oral cavity, with pt needing a liquid wash to assist. Her posterior oral transit was delayed  with all solids but more efficient with purees. Recommend starting Dys 1 diet and nectar thick liquids with additional SLP f/u to maximize swallow function and safety. SLP Visit Diagnosis Dysphagia, oropharyngeal phase (R13.12) Attention and concentration deficit following -- Frontal lobe and executive function deficit following -- Impact on safety and function Mild aspiration risk   CHL IP TREATMENT RECOMMENDATION 09/21/2018 Treatment Recommendations Therapy as outlined in treatment plan below   Prognosis 09/21/2018 Prognosis for Safe Diet Advancement Good Barriers to Reach Goals -- Barriers/Prognosis Comment -- CHL IP DIET RECOMMENDATION 09/21/2018 SLP Diet Recommendations Dysphagia 1 (Puree) solids;Nectar thick liquid  Liquid Administration via Straw Medication Administration Crushed with puree Compensations Slow rate;Small sips/bites;Monitor for anterior loss;Lingual sweep for clearance of pocketing Postural Changes Seated upright at 90 degrees   CHL IP OTHER RECOMMENDATIONS 09/21/2018 Recommended Consults -- Oral Care Recommendations Oral care BID Other Recommendations Order thickener from pharmacy;Prohibited food (jello, ice cream, thin soups);Remove water pitcher   CHL IP FOLLOW UP RECOMMENDATIONS 09/21/2018 Follow up Recommendations Inpatient Rehab   CHL IP FREQUENCY AND DURATION 09/21/2018 Speech Therapy Frequency (ACUTE ONLY) min 2x/week Treatment Duration 2 weeks      CHL IP ORAL PHASE 09/21/2018 Oral Phase Impaired Oral - Pudding Teaspoon -- Oral - Pudding Cup -- Oral - Honey Teaspoon -- Oral - Honey Cup -- Oral - Nectar Teaspoon -- Oral - Nectar Cup Premature spillage Oral - Nectar Straw Premature spillage Oral - Thin Teaspoon -- Oral - Thin Cup Premature spillage Oral - Thin Straw Premature spillage Oral - Puree Delayed oral transit Oral - Mech Soft Delayed oral transit;Impaired mastication Oral - Regular -- Oral - Multi-Consistency -- Oral - Pill -- Oral Phase - Comment --  CHL IP PHARYNGEAL PHASE 09/21/2018 Pharyngeal Phase Impaired Pharyngeal- Pudding Teaspoon -- Pharyngeal -- Pharyngeal- Pudding Cup -- Pharyngeal -- Pharyngeal- Honey Teaspoon -- Pharyngeal -- Pharyngeal- Honey Cup -- Pharyngeal -- Pharyngeal- Nectar Teaspoon -- Pharyngeal -- Pharyngeal- Nectar Cup Penetration/Aspiration before swallow Pharyngeal Material enters airway, remains ABOVE vocal cords then ejected out Pharyngeal- Nectar Straw Penetration/Aspiration before swallow Pharyngeal Material enters airway, CONTACTS cords and then ejected out Pharyngeal- Thin Teaspoon -- Pharyngeal -- Pharyngeal- Thin Cup Penetration/Aspiration before swallow Pharyngeal Material enters airway, passes BELOW cords without attempt by patient to eject out (silent aspiration)  Pharyngeal- Thin Straw Penetration/Aspiration before swallow Pharyngeal Material enters airway, passes BELOW cords without attempt by patient to eject out (silent aspiration) Pharyngeal- Puree WFL Pharyngeal -- Pharyngeal- Mechanical Soft WFL Pharyngeal -- Pharyngeal- Regular -- Pharyngeal -- Pharyngeal- Multi-consistency -- Pharyngeal -- Pharyngeal- Pill -- Pharyngeal -- Pharyngeal Comment --  CHL IP CERVICAL ESOPHAGEAL PHASE 09/21/2018 Cervical Esophageal Phase WFL Pudding Teaspoon -- Pudding Cup -- Honey Teaspoon -- Honey Cup -- Nectar Teaspoon -- Nectar Cup -- Nectar Straw -- Thin Teaspoon -- Thin Cup -- Thin Straw -- Puree -- Mechanical Soft -- Regular -- Multi-consistency -- Pill -- Cervical Esophageal Comment -- Virl AxeLaura P Nix 09/21/2018, 11:23 AM  Ivar DrapeLaura Nix, M.A. CCC-SLP Acute Rehabilitation Services Pager [phone] Office [phone]             [place] Transcranial Doppler  Result Date: 09/21/2018  Transcranial Doppler Indications: Stroke. Comparison Study: No prior studies. Performing Technologist: Chanda BusingGregory Collins RVT  Examination Guidelines: A complete evaluation includes B-mode imaging, spectral Doppler, color Doppler, and power Doppler as needed of all accessible portions of each vessel. Bilateral testing is considered an integral part of a complete examination. Limited examinations for reoccurring indications may be performed as noted.  +----------+-------------+----------+-----------+------------------+  RIGHT TCD  Right VM (cm) Depth (cm) Pulsatility      Comment        +----------+-------------+----------+-----------+------------------+  MCA                                             Unable to insonate  +----------+-------------+----------+-----------+------------------+  ACA                                             Unable to insonate  +----------+-------------+----------+-----------+------------------+  Term ICA       16.00                   1.31                          +----------+-------------+----------+-----------+------------------+  PCA            27.00                   0.88                         +----------+-------------+----------+-----------+------------------+  Opthalmic      20.00                   1.61                         +----------+-------------+----------+-----------+------------------+  ICA siphon     25.00                   1.66                         +----------+-------------+----------+-----------+------------------+  Vertebral     -23.00                   1.38                         +----------+-------------+----------+-----------+------------------+  +----------+------------+----------+-----------+-------+  LEFT TCD   Left VM (cm) Depth (cm) Pulsatility Comment  +----------+------------+----------+-----------+-------+  MCA           39.00                   1.44              +----------+------------+----------+-----------+-------+  ACA           -39.00                  0.95              +----------+------------+----------+-----------+-------+  Term ICA      23.00                   1.04              +----------+------------+----------+-----------+-------+  PCA           23.00                   0.86              +----------+------------+----------+-----------+-------+  Opthalmic     17.00  1.18              +----------+------------+----------+-----------+-------+  ICA siphon    16.00                   1.16              +----------+------------+----------+-----------+-------+  Vertebral     -28.00                  1.18              +----------+------------+----------+-----------+-------+  +------------+-------+------------------+               VM cm/s      Comment        +------------+-------+------------------+  Prox Basilar -41.00         1.02         +------------+-------+------------------+  Dist Basilar         Unable to insonate  +------------+-------+------------------+    Preliminary         Scheduled Meds:   stroke:  mapping our early stages of recovery book   Does not apply Once   amiodarone  200 mg Oral BID   aspirin  300 mg Rectal Daily   Or   aspirin  325 mg Oral Daily   atorvastatin  80 mg Oral q1800   digoxin  0.125 mg Oral Daily   diltiazem  10 mg Intravenous Once   enoxaparin (LOVENOX) injection  40 mg Subcutaneous Q24H   insulin aspart  0-9 Units Subcutaneous TID WC   Continuous Infusions:  0.9 % NaCl with KCl 20 mEq / L Stopped (09/21/18 1312)   dextrose 5 % and 0.45% NaCl 75 mL/hr at 09/22/18 0400     LOS: 2 days   Time spent= 35 mins    Renessa Wellnitz Arsenio Loader, MD [facility]  If 7PM-7AM, please contact night-coverage [email or web address] 09/22/2018, 11:03 AM

## 2018-09-23 LAB — BASIC METABOLIC PANEL
Anion gap: 7 (ref 5–15)
BUN: 13 mg/dL (ref 8–23)
CO2: 25 mmol/L (ref 22–32)
Calcium: 8.5 mg/dL — ABNORMAL LOW (ref 8.9–10.3)
Chloride: 111 mmol/L (ref 98–111)
Creatinine, Ser: 0.69 mg/dL (ref 0.44–1.00)
GFR calc Af Amer: >60 mL/min (ref >60)
GFR calc non Af Amer: >60 mL/min (ref >60)
Glucose, Bld: 104 mg/dL — ABNORMAL HIGH (ref 70–99)
Potassium: 4.5 mmol/L (ref 3.5–5.1)
Sodium: 143 mmol/L (ref 135–145)

## 2018-09-23 LAB — GLUCOSE, CAPILLARY
Glucose-Capillary: 119 mg/dL — ABNORMAL HIGH (ref 70–99)
Glucose-Capillary: 145 mg/dL — ABNORMAL HIGH (ref 70–99)
Glucose-Capillary: 83 mg/dL (ref 70–99)
Glucose-Capillary: 100 mg/dL — ABNORMAL HIGH (ref 70–99)

## 2018-09-23 LAB — CBC
HCT: 42 % (ref 36.0–46.0)
Hemoglobin: 13.3 g/dL (ref 12.0–15.0)
MCH: 27.2 pg (ref 26.0–34.0)
MCHC: 31.7 g/dL (ref 30.0–36.0)
MCV: 85.9 fL (ref 80.0–100.0)
Platelets: 181 10*3/uL (ref 150–400)
RBC: 4.89 MIL/uL (ref 3.87–5.11)
RDW: 13.5 % (ref 11.5–15.5)
WBC: 10.9 10*3/uL — ABNORMAL HIGH (ref 4.0–10.5)
nRBC: 0 % (ref 0.0–0.2)

## 2018-09-23 LAB — CK: Total CK: 789 U/L — ABNORMAL HIGH (ref 38–234)

## 2018-09-23 LAB — MAGNESIUM: Magnesium: 1.9 mg/dL (ref 1.7–2.4)

## 2018-09-23 MED ORDER — APIXABAN 5 MG PO TABS
5.0000 mg | ORAL_TABLET | Freq: Two times a day (BID) | ORAL | Status: DC
  Administered 2018-09-23 – 2018-09-27 (×9): 5 mg via ORAL
  Filled 2018-09-23 (×9): qty 1

## 2018-09-23 MED ORDER — DILTIAZEM HCL 30 MG PO TABS
30.0000 mg | ORAL_TABLET | Freq: Four times a day (QID) | ORAL | Status: DC
  Administered 2018-09-23 (×3): 30 mg via ORAL
  Filled 2018-09-23 (×3): qty 1

## 2018-09-23 MED ORDER — DILTIAZEM HCL ER COATED BEADS 120 MG PO CP24: 120.0000 mg | ORAL_CAPSULE | Freq: Every day | ORAL | Status: DC

## 2018-09-23 NOTE — Progress Notes (Signed)
Pharmacy - Eliquis  For Afib Weight > 60 kg, Age < 80 yrs, Scr < 1.5  Plan: Eliquis 5 mg po BID  Thank you Anette Guarneri, PharmD

## 2018-09-23 NOTE — Progress Notes (Signed)
PROGRESS NOTE    Rachel Fox  WUJ:[id number] DOB: June 05, 1945 DOA: 09/20/2018 PCP: Benita Stabile, MD   Brief Narrative:  73 year old with history of atrial fibrillation, diabetes mellitus type 2, essential hypertension who was brought to the hospital for evaluation of fall.  She was found to be in mild rhabdomyolysis, atrial fibrillation with RVR and also a large right-sided CVA on the CT of the head.  Neurology consulted who recommended starting Eliquis 3 to 5 days after stroke.  Patient was also seen by cardiology due to uncontrolled atrial fibrillation with RVR, digoxin and amiodarone were added.  Physical therapy recommended SNF/CIR. rehab team has been consulted.   Assessment & Plan:   Principal Problem:   Cerebrovascular accident (CVA) with involvement of left side of body (HCC) Active Problems:   Atrial fibrillation with RVR (HCC)   Hypertension   Rhabdomyolysis   Type 2 diabetes mellitus (HCC)   Hyperlipidemia   Hyperbilirubinemia   Leukocytosis   Hypernatremia   Pressure injury of skin  Moderate to large right-sided acute MCA stroke -MRI brain without contrast and MRA head and neck without contrast (given deranged renal function) -Maintain Euthermia.  -ASA ordered.  -Echocardiogram-ejection fraction 65% in atrial fibrillation -LDL 121, Lipitor 80 mg daily.  Hemoglobin A1c 6.1. -Frequent neuro checks -Atorvastatin. -Risk factor modification -Neurology team following -PT/OT eval, Speech consult-appreciate input -Plan will be to start Eliquis today per neurology.  Atrial fibrillation with RVR, heart rate still elevated 120s - Should be okay to resume Eliquis today.  Will await neurology to see the patient. - Cardiology team is following.  Continue digoxin and amiodarone.  Cardizem orally has been added.  Titrate as necessary.  Mild rhabdomyolysis -Gentle hydration.  Hypernatremia, resolved -Likely from dehydration.  Continue to follow sodium levels.  Diabetes  mellitus type 2 Hypoglycemia secondary to poor oral intake - Metformin on hold.  Accu-Chek sliding scale  Essential hypertension -Micardis and amlodipine on hold due to permissive hypertension.  This will also allow to adjust Cardizem for rate control.   DVT prophylaxis: SCDs Code Status: Full code none Family Communication: Call Deedee at [facility] request.  Disposition Plan: Maintain hospital stay for better heart rate control and then eventually transition her to rehab.  Consultants:   Cardiology  Neurology  Procedures:   None  Antimicrobials:   None   Subjective: Heart rate still fluctuating quite a bit with as high as 150 yesterday therefore p.o. Cardizem started.  This morning it has improved but still going up as high as 120s.  Patient states symptomatically she is feeling better but still has left-sided facial droop and hemiparesis.  Review of Systems Otherwise negative except as per HPI, including: General = no fevers, chills, dizziness, malaise, fatigue HEENT/EYES = negative for pain, redness, loss of vision, double vision, blurred vision, loss of hearing, sore throat, hoarseness, dysphagia Cardiovascular= negative for chest pain, palpitation, murmurs, lower extremity swelling Respiratory/lungs= negative for shortness of breath, cough, hemoptysis, wheezing, mucus production Gastrointestinal= negative for nausea, vomiting,, abdominal pain, melena, hematemesis Genitourinary= negative for Dysuria, Hematuria, Change in Urinary Frequency MSK = Negative for arthralgia, myalgias, Back Pain, Joint swelling  Neurology= Negative for headache, seizures, numbness, tingling  Psychiatry= Negative for anxiety, depression, suicidal and homocidal ideation Allergy/Immunology= Medication/Food allergy as listed  Skin= Negative for Rash, lesions, ulcers, itching    Objective: Vitals:   09/23/18 0111 09/23/18 0347 09/23/18 0727 09/23/18 0835  BP: 119/68 122/74 117/74   Pulse: 82  96 96 (!) 125  Resp: 15 12 18    Temp:  98.1 F (36.7 C) 98.1 F (36.7 C)   TempSrc:  Oral Oral   SpO2: 99% 100% 100%   Weight:      Height:        Intake/Output Summary (Last 24 hours) at 09/23/2018 1032 Last data filed at 09/23/2018 0729 Gross per 24 hour  Intake 1670 ml  Output 2300 ml  Net -630 ml   Filed Weights   09/20/18 1100 09/20/18 1942  Weight: 70.8 kg 72.8 kg    Examination:  Constitutional: NAD, calm, comfortable Eyes: PERRL, lids and conjunctivae normal ENMT: Mucous membranes are moist. Posterior pharynx clear of any exudate or lesions.Normal dentition.  Neck: normal, supple, no masses, no thyromegaly Respiratory: clear to auscultation bilaterally, no wheezing, no crackles. Normal respiratory effort. No accessory muscle use.  Cardiovascular:  tachycardia with irregularly irregular rhythmno murmurs / rubs / gallops. No extremity edema. 2+ pedal pulses. No carotid bruits.  Abdomen: no tenderness, no masses palpated. No hepatosplenomegaly. Bowel sounds positive.  Musculoskeletal: no clubbing / cyanosis. No joint deformity upper and lower extremities. Good ROM, no contractures. Normal muscle tone.  Skin: no rashes, lesions, ulcers. No induration Neurologic: Left-sided facial droop noted.  Left upper and lower extremity strength 1/5, right upper and lower extremity strength 4+/5. Psychiatric: Normal judgment and insight. Alert and oriented x 3. Normal mood.     Data Reviewed:   CBC: Recent Labs  Lab 09/20/18 1130 09/20/18 1142 09/21/18 1120 09/22/18 0313 09/23/18 0419  WBC  --  14.0*  --  11.5* 10.9*  NEUTROABS  --  11.5*  --   --   --   HGB 17.7* 15.5*  --  13.8 13.3  HCT 52.0* 49.2* 44.1 43.7 42.0  MCV  --  84.7  --  86.4 85.9  PLT  --  207  --  175 181   Basic Metabolic Panel: Recent Labs  Lab 09/20/18 1130 09/20/18 1142 09/22/18 0313 09/23/18 0419  NA 149* 146* 141 143  K 4.4 3.8 4.0 4.5  CL 112* 107 108 111  CO2  --  23 26 25   GLUCOSE 128*  132* 114* 104*  BUN 34* 31* 12 13  CREATININE 1.00 1.04* 0.92 0.69  CALCIUM  --  10.4* 8.5* 8.5*  MG  --  2.3 1.9 1.9  PHOS  --  2.7  --   --    GFR: Estimated Creatinine Clearance: 65.9 mL/min (by C-G formula based on SCr of 0.69 mg/dL). Liver Function Tests: Recent Labs  Lab 09/20/18 1142  AST 63*  ALT 30  ALKPHOS 61  BILITOT 1.3*  PROT 8.5*  ALBUMIN 4.2   No results for input(s): LIPASE, AMYLASE in the last 168 hours. No results for input(s): AMMONIA in the last 168 hours. Coagulation Profile: Recent Labs  Lab 09/20/18 1142  INR 1.1   Cardiac Enzymes: Recent Labs  Lab 09/20/18 1142 09/21/18 0525 09/22/18 0313 09/23/18 0419  CKTOTAL 2,651* 2,883* 1,637* 789*   BNP (last 3 results) No results for input(s): PROBNP in the last 8760 hours. HbA1C: Recent Labs    09/21/18 1120  HGBA1C 6.1*   CBG: Recent Labs  Lab 09/22/18 1232 09/22/18 1809 09/22/18 2115 09/22/18 2238 09/23/18 0614  GLUCAP 89 194* 64* 122* 119*   Lipid Profile: Recent Labs    09/21/18 1120  CHOL 184  HDL 42  LDLCALC 121*  TRIG 107  CHOLHDL 4.4   Thyroid Function Tests: Recent Labs  09/21/18 1120  TSH 1.655   Anemia Panel: Recent Labs    09/21/18 1120  VITAMINB12 1,052*   Sepsis Labs: No results for input(s): PROCALCITON, LATICACIDVEN in the last 168 hours.  Recent Results (from the past 240 hour(s))  SARS Coronavirus 2 (CEPHEID - Performed in [facility] hospital lab), Hosp Order     Status: None   Collection Time: 09/20/18 11:10 AM   Specimen: Nasopharyngeal Swab  Result Value Ref Range Status   SARS Coronavirus 2 NEGATIVE NEGATIVE Final    Comment: (NOTE) If result is NEGATIVE SARS-CoV-2 target nucleic acids are NOT DETECTED. The SARS-CoV-2 RNA is generally detectable in upper and lower  respiratory specimens during the acute phase of infection. The lowest  concentration of SARS-CoV-2 viral copies this assay can detect is 250  copies / mL. A negative result  does not preclude SARS-CoV-2 infection  and should not be used as the sole basis for treatment or other  patient management decisions.  A negative result may occur with  improper specimen collection / handling, submission of specimen other  than nasopharyngeal swab, presence of viral mutation(s) within the  areas targeted by this assay, and inadequate number of viral copies  (<250 copies / mL). A negative result must be combined with clinical  observations, patient history, and epidemiological information. If result is POSITIVE SARS-CoV-2 target nucleic acids are DETECTED. The SARS-CoV-2 RNA is generally detectable in upper and lower  respiratory specimens dur ing the acute phase of infection.  Positive  results are indicative of active infection with SARS-CoV-2.  Clinical  correlation with patient history and other diagnostic information is  necessary to determine patient infection status.  Positive results do  not rule out bacterial infection or co-infection with other viruses. If result is PRESUMPTIVE POSTIVE SARS-CoV-2 nucleic acids MAY BE PRESENT.   A presumptive positive result was obtained on the submitted specimen  and confirmed on repeat testing.  While 2019 novel coronavirus  (SARS-CoV-2) nucleic acids may be present in the submitted sample  additional confirmatory testing may be necessary for epidemiological  and / or clinical management purposes  to differentiate between  SARS-CoV-2 and other Sarbecovirus currently known to infect humans.  If clinically indicated additional testing with an alternate test  methodology [phone] is advised. The SARS-CoV-2 RNA is generally  detectable in upper and lower respiratory sp ecimens during the acute  phase of infection. The expected result is Negative. Fact Sheet for Patients:  BoilerBrush.com.cyhttps://www.fda.gov/media/136312/download Fact Sheet for Healthcare Providers: https://pope.com/https://www.fda.gov/media/136313/download This test is not yet approved or  cleared by the [place] FDA and has been authorized for detection and/or diagnosis of SARS-CoV-2 by FDA under an Emergency Use Authorization (EUA).  This EUA will remain in effect (meaning this test can be used) for the duration of the COVID-19 declaration under Section 564(b)(1) of the Act, 21 U.S.C. section 360bbb-3(b)(1), unless the authorization is terminated or revoked sooner. Performed at [facility], [address] [place], [place] [address]   Urine culture     Status: None   Collection Time: 09/20/18 12:46 PM   Specimen: Urine, Clean Catch  Result Value Ref Range Status   Specimen Description   Final    URINE, CLEAN CATCH Performed at [facility], [address] [place], [place] [address]    Special Requests   Final    NONE Performed at [facility], [address] [place], [place] [address]    Culture   Final    Multiple bacterial morphotypes  present, none predominant. Suggest appropriate recollection if clinically indicated.   Report Status 09/21/2018 FINAL  Final         Radiology Studies: Dg Swallowing Func-speech Pathology  Result Date: 09/21/2018 Objective Swallowing Evaluation: Type of Study: MBS-Modified Barium Swallow Study  Patient Details Name: ANEYA DADDONA MRN: [id number] Date of Birth: 06/23/1945 Today's Date: 09/21/2018 Time: SLP Start Time (ACUTE ONLY): 1020 -SLP Stop Time (ACUTE ONLY): 1039 SLP Time Calculation (min) (ACUTE ONLY): 19 min Past Medical History: Past Medical History: Diagnosis Date  High cholesterol   Hypertension   Type 2 diabetes mellitus (HCC)  Past Surgical History: Past Surgical History: Procedure Laterality Date  ABDOMINAL HYSTERECTOMY    TUBAL LIGATION   HPI: 73 year old with history of atrial fibrillation, diabetes mellitus type 2, essential hypertension who was brought to the hospital for evaluation of fall.  She was found to be in mild rhabdomyolysis, atrial fibrillation with RVR and also a large right-sided MCA CVA on the CT  of the head  Subjective: alert and asking for water Assessment / Plan / Recommendation CHL IP CLINICAL IMPRESSIONS 09/21/2018 Clinical Impression Pt presents with a mild to moderate dysphagia with primarily oral deficits. She has left-sided labial and lingual weakness that results in anterior and posterior spillage of boluses, but with improved containment and control with use of straw. Premature spillage of thin liquids is penetrated and silently aspirated in small quantities. Penetration with nectar thick liquids is trace, shallow, and clears spontaneously either as she completes the swallow or with a reflexive throat clear. Nectar thick liquids did reach the true vocal cords x1 but was still ejected. This occurred in the setting of inability to fully clear soft solids from her oral cavity, with pt needing a liquid wash to assist. Her posterior oral transit was delayed with all solids but more efficient with purees. Recommend starting Dys 1 diet and nectar thick liquids with additional SLP f/u to maximize swallow function and safety. SLP Visit Diagnosis Dysphagia, oropharyngeal phase (R13.12) Attention and concentration deficit following -- Frontal lobe and executive function deficit following -- Impact on safety and function Mild aspiration risk   CHL IP TREATMENT RECOMMENDATION 09/21/2018 Treatment Recommendations Therapy as outlined in treatment plan below   Prognosis 09/21/2018 Prognosis for Safe Diet Advancement Good Barriers to Reach Goals -- Barriers/Prognosis Comment -- CHL IP DIET RECOMMENDATION 09/21/2018 SLP Diet Recommendations Dysphagia 1 (Puree) solids;Nectar thick liquid Liquid Administration via Straw Medication Administration Crushed with puree Compensations Slow rate;Small sips/bites;Monitor for anterior loss;Lingual sweep for clearance of pocketing Postural Changes Seated upright at 90 degrees   CHL IP OTHER RECOMMENDATIONS 09/21/2018 Recommended Consults -- Oral Care Recommendations Oral care BID Other  Recommendations Order thickener from pharmacy;Prohibited food (jello, ice cream, thin soups);Remove water pitcher   CHL IP FOLLOW UP RECOMMENDATIONS 09/21/2018 Follow up Recommendations Inpatient Rehab   CHL IP FREQUENCY AND DURATION 09/21/2018 Speech Therapy Frequency (ACUTE ONLY) min 2x/week Treatment Duration 2 weeks      CHL IP ORAL PHASE 09/21/2018 Oral Phase Impaired Oral - Pudding Teaspoon -- Oral - Pudding Cup -- Oral - Honey Teaspoon -- Oral - Honey Cup -- Oral - Nectar Teaspoon -- Oral - Nectar Cup Premature spillage Oral - Nectar Straw Premature spillage Oral - Thin Teaspoon -- Oral - Thin Cup Premature spillage Oral - Thin Straw Premature spillage Oral - Puree Delayed oral transit Oral - Mech Soft Delayed oral transit;Impaired mastication Oral - Regular -- Oral - Multi-Consistency -- Oral - Pill -- Oral Phase -  Comment --  CHL IP PHARYNGEAL PHASE 09/21/2018 Pharyngeal Phase Impaired Pharyngeal- Pudding Teaspoon -- Pharyngeal -- Pharyngeal- Pudding Cup -- Pharyngeal -- Pharyngeal- Honey Teaspoon -- Pharyngeal -- Pharyngeal- Honey Cup -- Pharyngeal -- Pharyngeal- Nectar Teaspoon -- Pharyngeal -- Pharyngeal- Nectar Cup Penetration/Aspiration before swallow Pharyngeal Material enters airway, remains ABOVE vocal cords then ejected out Pharyngeal- Nectar Straw Penetration/Aspiration before swallow Pharyngeal Material enters airway, CONTACTS cords and then ejected out Pharyngeal- Thin Teaspoon -- Pharyngeal -- Pharyngeal- Thin Cup Penetration/Aspiration before swallow Pharyngeal Material enters airway, passes BELOW cords without attempt by patient to eject out (silent aspiration) Pharyngeal- Thin Straw Penetration/Aspiration before swallow Pharyngeal Material enters airway, passes BELOW cords without attempt by patient to eject out (silent aspiration) Pharyngeal- Puree WFL Pharyngeal -- Pharyngeal- Mechanical Soft WFL Pharyngeal -- Pharyngeal- Regular -- Pharyngeal -- Pharyngeal- Multi-consistency -- Pharyngeal --  Pharyngeal- Pill -- Pharyngeal -- Pharyngeal Comment --  CHL IP CERVICAL ESOPHAGEAL PHASE 09/21/2018 Cervical Esophageal Phase WFL Pudding Teaspoon -- Pudding Cup -- Honey Teaspoon -- Honey Cup -- Nectar Teaspoon -- Nectar Cup -- Nectar Straw -- Thin Teaspoon -- Thin Cup -- Thin Straw -- Puree -- Mechanical Soft -- Regular -- Multi-consistency -- Pill -- Cervical Esophageal Comment -- Virl AxeLaura P Nix 09/21/2018, 11:23 AM  Ivar DrapeLaura Nix, M.A. CCC-SLP Acute Rehabilitation Services Pager [phone] Office [phone]             [place] Transcranial Doppler  Result Date: 09/22/2018  Transcranial Doppler Indications: Stroke. Comparison Study: No prior studies. Performing Technologist: Chanda BusingGregory Collins RVT  Examination Guidelines: A complete evaluation includes B-mode imaging, spectral Doppler, color Doppler, and power Doppler as needed of all accessible portions of each vessel. Bilateral testing is considered an integral part of a complete examination. Limited examinations for reoccurring indications may be performed as noted.  +----------+-------------+----------+-----------+------------------+  RIGHT TCD  Right VM (cm) Depth (cm) Pulsatility      Comment        +----------+-------------+----------+-----------+------------------+  MCA                                             Unable to insonate  +----------+-------------+----------+-----------+------------------+  ACA                                             Unable to insonate  +----------+-------------+----------+-----------+------------------+  Term ICA       16.00                   1.31                         +----------+-------------+----------+-----------+------------------+  PCA            27.00                   0.88                         +----------+-------------+----------+-----------+------------------+  Opthalmic      20.00                   1.61                         +----------+-------------+----------+-----------+------------------+  ICA siphon      25.00                   1.66                         +----------+-------------+----------+-----------+------------------+  Vertebral     -23.00                   1.38                         +----------+-------------+----------+-----------+------------------+  +----------+------------+----------+-----------+-------+  LEFT TCD   Left VM (cm) Depth (cm) Pulsatility Comment  +----------+------------+----------+-----------+-------+  MCA           39.00                   1.44              +----------+------------+----------+-----------+-------+  ACA           -39.00                  0.95              +----------+------------+----------+-----------+-------+  Term ICA      23.00                   1.04              +----------+------------+----------+-----------+-------+  PCA           23.00                   0.86              +----------+------------+----------+-----------+-------+  Opthalmic     17.00                   1.18              +----------+------------+----------+-----------+-------+  ICA siphon    16.00                   1.16              +----------+------------+----------+-----------+-------+  Vertebral     -28.00                  1.18              +----------+------------+----------+-----------+-------+  +------------+-------+------------------+               VM cm/s      Comment        +------------+-------+------------------+  Prox Basilar -41.00         1.02         +------------+-------+------------------+  Dist Basilar         Unable to insonate  +------------+-------+------------------+ Summary:  Absent right temporal window limits evaluation of anetrior circulation on right.Normal mean flow velocities in remaining identified vessles of anterior and posterior cerebral circulation.Globally elevated pulsaitity indices suggest diffus eintracranial atherosclerosis likely. *See table(s) above for measurements and observations.  Diagnosing physician: Antony Contras MD Electronically signed by Antony Contras MD on  09/22/2018 at 1:02:41 PM.    Final         Scheduled Meds:   stroke: mapping our early stages of recovery book   Does not apply Once   amiodarone  200 mg Oral BID   aspirin  300 mg Rectal Daily   Or   aspirin  325  mg Oral Daily   atorvastatin  80 mg Oral q1800   digoxin  0.125 mg Oral Daily   diltiazem  30 mg Oral Q8H   enoxaparin (LOVENOX) injection  40 mg Subcutaneous Q24H   insulin aspart  0-9 Units Subcutaneous TID WC   Continuous Infusions:  0.9 % NaCl with KCl 20 mEq / L 125 mL/hr at 09/22/18 2254     LOS: 3 days   Time spent= 35 mins    Cathey Fredenburg Joline Maxcy, MD [facility]  If 7PM-7AM, please contact night-coverage [email or web address] 09/23/2018, 10:32 AM

## 2018-09-23 NOTE — Plan of Care (Signed)
  Problem: Clinical Measurements: Goal: Ability to maintain clinical measurements within normal limits will improve Outcome: Progressing   Problem: Nutrition: Goal: Adequate nutrition will be maintained Outcome: Progressing   

## 2018-09-23 NOTE — Progress Notes (Signed)
Occupational Therapy Treatment Patient Details Name: Rachel PageBarbara C Hammer MRN: [id number] DOB: 12-22-1945 Today's Date: 09/23/2018    History of present illness Pt is a 73 y/o female with a PMH significant for HTN, DM. She presents after being found on the floor after ~2 days. CT revealed moderate to large R-sided subacute MCA infarct. Pt was also found to be in a-fib with RVR and with mild rhabdomyolitis.    OT comments  Pt using stedy for mobility today and increasing function of L hand to grip bar for stedy with cues to stand upright. Pt with R sided lean in sitting and standing. Pt requiring assist from stedy for transfer as no steps are able to be taken. Pt using RUE for light grooming at EOB with poor trunk control. Pt directed to look to left as often as possible. Pt compensating with visual deficits to turn head to left. Pt reports pain in L shoulder; no subluxation noted. Pt's L ebow supported with all transfers. Pt may benefit from shoulder sling for stability of LUE. Pt greatly requires continued OT skilled services for all ADL, mobility and wt bearing in LUE. OT following acutely.    Follow Up Recommendations  CIR;Supervision/Assistance - 24 hour    Equipment Recommendations  None recommended by OT    Recommendations for Other Services      Precautions / Restrictions Precautions Precautions: Fall Precaution Comments: L side weak/flaccid, right gaze preference, L shoulder pain (risk for subluxation), pressure injury from laying on floor PTA Restrictions Weight Bearing Restrictions: No       Mobility Bed Mobility Overal bed mobility: Needs Assistance Bed Mobility: Rolling;Sidelying to Sit Rolling: Mod assist Sidelying to sit: Mod assist;+2 for physical assistance;HOB elevated Supine to sit: HOB elevated Sit to supine: Mod assist;+2 for physical assistance   General bed mobility comments: Rolled to the L. Hand-over-hand assist to reach for railing with strong R arm. +2  assist for trunk elevation to full sitting position at EOB with bed use to scoot hips completely to EOB.   Transfers Overall transfer level: Needs assistance   Transfers: Sit to/from Stand Sit to Stand: Mod assist;+2 physical assistance         General transfer comment: ASsist with weight shifting to upright as pt leaning to R and LUE required tactile cues to stay on stedy bar.    Balance Overall balance assessment: Needs assistance Sitting-balance support: Feet supported;No upper extremity supported;Single extremity supported Sitting balance-Leahy Scale: Poor Sitting balance - Comments: L lateral lean improving with time and practice.  Postural control: Left lateral lean Standing balance support: Single extremity supported Standing balance-Leahy Scale: Zero Standing balance comment: +2 required                           ADL either performed or assessed with clinical judgement   ADL Overall ADL's : Needs assistance/impaired Eating/Feeding: Set up;Sitting   Grooming: Min guard;Sitting;Cueing for safety;Cueing for sequencing                               Functional mobility during ADLs: Minimal assistance;Rolling walker;Cueing for safety;Cueing for sequencing(using stedy) General ADL Comments: Pt HR reached 125 BPM with activity. Pt returned to 110s at rest.     Vision Baseline Vision/History: Wears glasses Wears Glasses: At all times Vision Assessment?: Yes Eye Alignment: Within Functional Limits Ocular Range of Motion: Within Functional Limits Alignment/Gaze Preference:  Within Defined Limits Tracking/Visual Pursuits: Able to track stimulus in all quads without difficulty Saccades: Within functional limits Additional Comments: Cues to gaze to L   Perception     Praxis      Cognition Arousal/Alertness: Awake/alert Behavior During Therapy: WFL for tasks assessed/performed Overall Cognitive Status: Impaired/Different from baseline Area of  Impairment: Attention;Following commands;Safety/judgement;Awareness;Problem solving                   Current Attention Level: Sustained   Following Commands: Follows one step commands consistently(on R side) Safety/Judgement: Decreased awareness of safety;Decreased awareness of deficits Awareness: Emergent Problem Solving: Difficulty sequencing;Requires verbal cues;Requires tactile cues General Comments: Pt can tell me that her right side is her strong side, L is weak and when I give her the space to fall over in sitting she can tell me she is falling to the left and can help self correct with repetition.         Exercises     Shoulder Instructions       General Comments HR 110-125 BPM with exertion.    Pertinent Vitals/ Pain       Pain Assessment: Faces Faces Pain Scale: Hurts even more Pain Location: L shoulder Pain Descriptors / Indicators: Grimacing;Guarding Pain Intervention(s): Limited activity within patient's tolerance  Home Living                                          Prior Functioning/Environment Level of Independence: Independent            Frequency  Min 2X/week        Progress Toward Goals  OT Goals(current goals can now be found in the care plan section)     Acute Rehab OT Goals Patient Stated Goal: Decrease L shoulder pain OT Goal Formulation: With patient Time For Goal Achievement: 10/05/18 Potential to Achieve Goals: Good  Plan      Co-evaluation    PT/OT/SLP Co-Evaluation/Treatment: Yes Reason for Co-Treatment: Complexity of the patient's impairments (multi-system involvement)   OT goals addressed during session: ADL's and self-care      AM-PAC OT "6 Clicks" Daily Activity     Outcome Measure   Help from another person eating meals?: A Lot Help from another person taking care of personal grooming?: A Lot Help from another person toileting, which includes using toliet, bedpan, or urinal?: Total Help  from another person bathing (including washing, rinsing, drying)?: A Lot Help from another person to put on and taking off regular upper body clothing?: A Lot Help from another person to put on and taking off regular lower body clothing?: Total 6 Click Score: 10    End of Session Equipment Utilized During Treatment: Gait belt;Rolling walker  OT Visit Diagnosis: Muscle weakness (generalized) (M62.81);Hemiplegia and hemiparesis Hemiplegia - Right/Left: Left Hemiplegia - dominant/non-dominant: Non-Dominant Hemiplegia - caused by: Cerebral infarction   Activity Tolerance Patient tolerated treatment well;Treatment limited secondary to medical complications (Comment)   Patient Left in chair;with call bell/phone within reach;with chair alarm set   Nurse Communication Mobility status        Time: 7628-3151 OT Time Calculation (min): 38 min  Charges: OT General Charges $OT Visit: 1 Visit OT Treatments $Self Care/Home Management : 8-22 mins  Darryl Nestle) Marsa Aris OTR/L Acute Rehabilitation Services Pager: [phone] Office: [phone]    Jenene Slicker Pierra Skora 09/23/2018, 5:10 PM

## 2018-09-23 NOTE — Progress Notes (Addendum)
Progress Note  Patient Name: Rachel PageBarbara C Foushee Date of Encounter: 09/23/2018  Primary Cardiologist: Dina RichBranch, Jonathan, MD   Subjective   No significant overnight events. Patient reports possible mild chest pain last week but denies any chest pain this hospitalization. She reports some shortness of breath with activity as well as some palpitations at time.   Inpatient Medications    Scheduled Meds: .  stroke: mapping our early stages of recovery book   Does not apply Once  . amiodarone  200 mg Oral BID  . aspirin  300 mg Rectal Daily   Or  . aspirin  325 mg Oral Daily  . atorvastatin  80 mg Oral q1800  . digoxin  0.125 mg Oral Daily  . diltiazem  30 mg Oral Q8H  . enoxaparin (LOVENOX) injection  40 mg Subcutaneous Q24H  . insulin aspart  0-9 Units Subcutaneous TID WC   Continuous Infusions: . 0.9 % NaCl with KCl 20 mEq / L 125 mL/hr at 09/22/18 2254   PRN Meds: acetaminophen **OR** acetaminophen (TYLENOL) oral liquid 160 mg/5 mL **OR** acetaminophen, alum & mag hydroxide-simeth, guaiFENesin-dextromethorphan, hydrocortisone, hydrocortisone cream, ipratropium-albuterol, lip balm, loratadine, metoprolol tartrate, Muscle Rub, phenol, polyethylene glycol, polyvinyl alcohol, prochlorperazine, Resource ThickenUp Clear, senna-docusate, sodium chloride   Vital Signs    Vitals:   09/23/18 0111 09/23/18 0347 09/23/18 0727 09/23/18 0835  BP: 119/68 122/74 117/74   Pulse: 82 96 96 (!) 125  Resp: 15 12 18    Temp:  98.1 F (36.7 C) 98.1 F (36.7 C)   TempSrc:  Oral Oral   SpO2: 99% 100% 100%   Weight:      Height:        Intake/Output Summary (Last 24 hours) at 09/23/2018 1034 Last data filed at 09/23/2018 0729 Gross per 24 hour  Intake 1670 ml  Output 2300 ml  Net -630 ml   Last 3 Weights 09/20/2018 09/20/2018  Weight (lbs) 160 lb 7.9 oz 156 lb  Weight (kg) 72.8 kg 70.761 kg      Telemetry    Atrial fibrillation with rates ranging from the 100's to 130's and as high as 160's at  times. - Personally Reviewed  ECG    No new ECG tracing today. - Personally Reviewed  Physical Exam   GEN: African-American female sitting comfortably in chair in no acute distress.   Neck: Supple. Cardiac: Tachycardic with irregularly irregular rhythm. No murmurs, gallops, or rubs. Respiratory: No labored breathing. Clear to auscultation bilaterally. No wheezes, rhonchi, or rales.  GI: Abdomen soft, non-distended, and non-tender. Bowel sounds present.  MS: No lower extremity edema. No deformities. Neuro:  Left sided facial droop. Left hemiplegia. Psych: Normal affect. Responds appropriately.  Labs    High Sensitivity Troponin:   Recent Labs  Lab 09/20/18 1142  TROPONINIHS 19.00*      Cardiac EnzymesNo results for input(s): TROPONINI in the last 168 hours. No results for input(s): TROPIPOC in the last 168 hours.   Chemistry Recent Labs  Lab 09/20/18 1142 09/22/18 0313 09/23/18 0419  NA 146* 141 143  K 3.8 4.0 4.5  CL 107 108 111  CO2 23 26 25   GLUCOSE 132* 114* 104*  BUN 31* 12 13  CREATININE 1.04* 0.92 0.69  CALCIUM 10.4* 8.5* 8.5*  PROT 8.5*  --   --   ALBUMIN 4.2  --   --   AST 63*  --   --   ALT 30  --   --   ALKPHOS 61  --   --  BILITOT 1.3*  --   --   GFRNONAA 54* >60 >60  GFRAA >60 >60 >60  ANIONGAP 16* 7 7     Hematology Recent Labs  Lab 09/20/18 1142 09/21/18 1120 09/22/18 0313 09/23/18 0419  WBC 14.0*  --  11.5* 10.9*  RBC 5.81*  --  5.06 4.89  HGB 15.5*  --  13.8 13.3  HCT 49.2* 44.1 43.7 42.0  MCV 84.7  --  86.4 85.9  MCH 26.7  --  27.3 27.2  MCHC 31.5  --  31.6 31.7  RDW 14.9  --  13.8 13.5  PLT 207  --  175 181    BNP Recent Labs  Lab 09/20/18 1142  BNP 485.0*     DDimer No results for input(s): DDIMER in the last 168 hours.   Radiology    [place] Transcranial Doppler  Result Date: 09/22/2018  Transcranial Doppler Indications: Stroke. Comparison Study: No prior studies. Performing Technologist: Chanda BusingGregory Collins RVT   Examination Guidelines: A complete evaluation includes B-mode imaging, spectral Doppler, color Doppler, and power Doppler as needed of all accessible portions of each vessel. Bilateral testing is considered an integral part of a complete examination. Limited examinations for reoccurring indications may be performed as noted.  +----------+-------------+----------+-----------+------------------+ RIGHT TCD Right VM (cm)Depth (cm)Pulsatility     Comment       +----------+-------------+----------+-----------+------------------+ MCA                                         Unable to insonate +----------+-------------+----------+-----------+------------------+ ACA                                         Unable to insonate +----------+-------------+----------+-----------+------------------+ Term ICA      16.00                 1.31                       +----------+-------------+----------+-----------+------------------+ PCA           27.00                 0.88                       +----------+-------------+----------+-----------+------------------+ Opthalmic     20.00                 1.61                       +----------+-------------+----------+-----------+------------------+ ICA siphon    25.00                 1.66                       +----------+-------------+----------+-----------+------------------+ Vertebral    -23.00                 1.38                       +----------+-------------+----------+-----------+------------------+  +----------+------------+----------+-----------+-------+ LEFT TCD  Left VM (cm)Depth (cm)PulsatilityComment +----------+------------+----------+-----------+-------+ MCA          39.00                 1.44            +----------+------------+----------+-----------+-------+  ACA          -39.00                0.95            +----------+------------+----------+-----------+-------+ Term ICA     23.00                 1.04             +----------+------------+----------+-----------+-------+ PCA          23.00                 0.86            +----------+------------+----------+-----------+-------+ Opthalmic    17.00                 1.18            +----------+------------+----------+-----------+-------+ ICA siphon   16.00                 1.16            +----------+------------+----------+-----------+-------+ Vertebral    -28.00                1.18            +----------+------------+----------+-----------+-------+  +------------+-------+------------------+             VM cm/s     Comment       +------------+-------+------------------+ Prox Basilar-41.00        1.02        +------------+-------+------------------+ Dist Basilar       Unable to insonate +------------+-------+------------------+ Summary:  Absent right temporal window limits evaluation of anetrior circulation on right.Normal mean flow velocities in remaining identified vessles of anterior and posterior cerebral circulation.Globally elevated pulsaitity indices suggest diffus eintracranial atherosclerosis likely. *See table(s) above for measurements and observations.  Diagnosing physician: Antony Contras MD Electronically signed by Antony Contras MD on 09/22/2018 at 1:02:41 PM.    Final     Cardiac Studies   Echocardiogram 09/21/2018: Impressions: 1. The left ventricle has hyperdynamic systolic function, with an ejection fraction of >65%. The cavity size was normal. There is mildly increased left ventricular wall thickness. Left ventricular diastolic function could not be evaluated secondary to atrial fibrillation.  2. The right ventricle has normal systolic function. The cavity was normal. There is no increase in right ventricular wall thickness.  3. Trivial pericardial effusion is present.  4. No evidence of mitral valve stenosis.  5. No stenosis of the aortic valve.  6. The interatrial septum was not assessed.  Patient Profile   Ms.  Coke is a 73 y.o. female with a history of hypertension, hyperlipidemia, and diabetes mellitus, who is being seen today for evaluation of new onset atrial fibrillation with RVR after presenting with subacute CVA at the request of Dr. Thurnell Garbe.  Assessment & Plan    Persistent Atrial Fibrillation with RVR - Rates continue to be difficult to control. Telemetry shows atrial fibrillation with baseline rate ranging from the 100's to 130's at rest and as high as the 160's at times. - Echo this admission showed LVEF of >65%.  - Continue Cardizem 30mg  three times daily.  - Continue Digoxins 0.125mg  daily. - Amiodarone 200mg  daily added yesterday. - Patient was having some hypotension initially but BP seems to have stabilized. Could considering increasing Cardizem to four times daily. Will discuss with MD. - CHA2DS-VASC = 6 (HTN, DM, age >78, female, recent stroke x2). Patient will need long-term coagulation.  However, will defer timing of this to Neurology given recent stroke.  Elevated Troponin - High-sensitivity troponin minimally elevated at 19. Suspect demand ischemia in the setting of atrial fibrillation with RVR as well as stroke. No additional ischemic work-up planned at this time.   Hypertension - BP currently well controlled. Most recent BP 117/74.  - Continue Cardizem as above.  Hyperlipidemia - Lipid panel this admission: Cholesterol 184, Triglycerides 107, HDL 42, LDL 121. - Lipitor 80mg  daily added this admission. - Will need repeat lipid panel and CMP in 6 weeks.   Diabetes - Management per primary team.  For questions or updates, please contact [facility] Please consult [email or web address] for contact info under        Signed, Corrin ParkerCallie E Goodrich, PA-C  09/23/2018, 10:34 AM    Personally seen and examined. Agree with above.   Still having difficulty controlling heart rate.  Amiodarone 200 mg twice daily was started 09/22/2018. -Digoxin 0.125 mg -Diltiazem 30 every 8 hours.   Seems as though blood pressure now could tolerate a slight increase in diltiazem.  I do understand that we have been attempting permissive elevated blood pressures in the setting of stroke. -I will change diltiazem to Q6hrs. Has to crush pills  Donato SchultzMark Skains, MD

## 2018-09-23 NOTE — Progress Notes (Signed)
STROKE TEAM PROGRESS NOTE   INTERVAL HISTORY She is alert and interactive patient continues to have right gaze preference and left hemiplegia and remains in atrial fibrillation with rapid ventricular response.  despite digoxin and cardizem Vitals:   09/23/18 0727 09/23/18 0835 09/23/18 1153 09/23/18 1402  BP: 117/74  109/65 119/75  Pulse: 96 (!) 125 (!) 104   Resp: 18  17   Temp: 98.1 F (36.7 C)  98.3 F (36.8 C)   TempSrc: Oral  Oral   SpO2: 100%  100%   Weight:      Height:        CBC:  Recent Labs  Lab 09/20/18 1142  09/22/18 0313 09/23/18 0419  WBC 14.0*  --  11.5* 10.9*  NEUTROABS 11.5*  --   --   --   HGB 15.5*  --  13.8 13.3  HCT 49.2*   < > 43.7 42.0  MCV 84.7  --  86.4 85.9  PLT 207  --  175 181   < > = values in this interval not displayed.    Basic Metabolic Panel:  Recent Labs  Lab 09/20/18 1142 09/22/18 0313 09/23/18 0419  NA 146* 141 143  K 3.8 4.0 4.5  CL 107 108 111  CO2 23 26 25   GLUCOSE 132* 114* 104*  BUN 31* 12 13  CREATININE 1.04* 0.92 0.69  CALCIUM 10.4* 8.5* 8.5*  MG 2.3 1.9 1.9  PHOS 2.7  --   --    Lipid Panel:     Component Value Date/Time   CHOL 184 09/21/2018 1120   TRIG 107 09/21/2018 1120   HDL 42 09/21/2018 1120   CHOLHDL 4.4 09/21/2018 1120   VLDL 21 09/21/2018 1120   LDLCALC 121 (H) 09/21/2018 1120   HgbA1c:  Lab Results  Component Value Date   HGBA1C 6.1 (H) 09/21/2018   Urine Drug Screen:     Component Value Date/Time   LABOPIA NONE DETECTED 09/20/2018 1246   COCAINSCRNUR NONE DETECTED 09/20/2018 1246   LABBENZ NONE DETECTED 09/20/2018 1246   AMPHETMU NONE DETECTED 09/20/2018 1246   THCU NONE DETECTED 09/20/2018 1246   LABBARB NONE DETECTED 09/20/2018 1246    Alcohol Level     Component Value Date/Time   ETH <10 09/20/2018 1124    IMAGING [place] Transcranial Doppler  Result Date: 09/22/2018  Transcranial Doppler Indications: Stroke. Comparison Study: No prior studies. Performing Technologist:  Chanda BusingGregory Collins RVT  Examination Guidelines: A complete evaluation includes B-mode imaging, spectral Doppler, color Doppler, and power Doppler as needed of all accessible portions of each vessel. Bilateral testing is considered an integral part of a complete examination. Limited examinations for reoccurring indications may be performed as noted.  +----------+-------------+----------+-----------+------------------+ RIGHT TCD Right VM (cm)Depth (cm)Pulsatility     Comment       +----------+-------------+----------+-----------+------------------+ MCA                                         Unable to insonate +----------+-------------+----------+-----------+------------------+ ACA                                         Unable to insonate +----------+-------------+----------+-----------+------------------+ Term ICA      16.00                 1.31                       +----------+-------------+----------+-----------+------------------+  PCA           27.00                 0.88                       +----------+-------------+----------+-----------+------------------+ Opthalmic     20.00                 1.61                       +----------+-------------+----------+-----------+------------------+ ICA siphon    25.00                 1.66                       +----------+-------------+----------+-----------+------------------+ Vertebral    -23.00                 1.38                       +----------+-------------+----------+-----------+------------------+  +----------+------------+----------+-----------+-------+ LEFT TCD  Left VM (cm)Depth (cm)PulsatilityComment +----------+------------+----------+-----------+-------+ MCA          39.00                 1.44            +----------+------------+----------+-----------+-------+ ACA          -39.00                0.95            +----------+------------+----------+-----------+-------+ Term ICA     23.00                  1.04            +----------+------------+----------+-----------+-------+ PCA          23.00                 0.86            +----------+------------+----------+-----------+-------+ Opthalmic    17.00                 1.18            +----------+------------+----------+-----------+-------+ ICA siphon   16.00                 1.16            +----------+------------+----------+-----------+-------+ Vertebral    -28.00                1.18            +----------+------------+----------+-----------+-------+  +------------+-------+------------------+             VM cm/s     Comment       +------------+-------+------------------+ Prox Basilar-41.00        1.02        +------------+-------+------------------+ Dist Basilar       Unable to insonate +------------+-------+------------------+ Summary:  Absent right temporal window limits evaluation of anetrior circulation on right.Normal mean flow velocities in remaining identified vessles of anterior and posterior cerebral circulation.Globally elevated pulsaitity indices suggest diffus eintracranial atherosclerosis likely. *See table(s) above for measurements and observations.  Diagnosing physician: Delia HeadyPramod Sethi MD Electronically signed by Delia HeadyPramod Sethi MD on 09/22/2018 at 1:02:41 PM.    Final    2D echocardiogram  1. The left ventricle has hyperdynamic systolic function, with an ejection fraction of >65%. The cavity size was  normal. There is mildly increased left ventricular wall thickness. Left ventricular diastolic function could not be evaluated secondary to  atrial fibrillation.  2. The right ventricle has normal systolic function. The cavity was normal. There is no increase in right ventricular wall thickness.  3. Trivial pericardial effusion is present.  4. No evidence of mitral valve stenosis.  5. No stenosis of the aortic valve.  6. The interatrial septum was not assessed.   PHYSICAL EXAM Obese elderly  African-American lady lying comfortably in bed. . Afebrile. Head is nontraumatic. Neck is supple without bruit.    Cardiac exam no murmur or gallop. Lungs are clear to auscultation. Distal pulses are well felt. Neurological Exam :  Awake alert oriented to time and place.  She follows commands well..  Dysarthria.  Right gaze preference.  Unable to look to the left past midline.  Left homonymous hemianopsia and does not blink to threat on the left.  Moderate left lower facial weakness.  Tongue midline.  Dense left hemiplegia with 0/5 strength.  Purposeful antigravity movement on the right with good strength.  Sensation is diminished on the left compared to the right.  Left plantar is upgoing right is downgoing.  Gait not tested.  ASSESSMENT/PLAN Ms. JAZELYN SIPE is a 73 y.o. female with history of HTN, HLD, type II DVT found down on her floor x2 days.  Found to have a large right brain stroke and also in new A. fib with RVR.   Stroke:   Large R MCA infarct embolic secondary to new onset A. fib   CT head moderately large R MCA infarct with 3 mm left shift  CT CS no fracture TCD Absent right temporal window limits evaluation of anetrior circulation on right.Normal mean flow velocities in remaining identified vessles of anterior and posterior cerebral circulation.Globally elevated pulsaitity indices suggest diffus eintracranial   atherosclerosis likely  Carotid Doppler no significant stenosis.  Incidentally noted cardiac arrhythmia.  2D Echo EF >65%.  In atrial fibrillation.  LDL 121  HgbA1c 6.1  Add Lovenox 40 subcu daily for VTE prophylaxis  No antithrombotic prior to admission, now on aspirin 325 mg daily.  Continue aspirin for now.  Will need anticoagulation  In 3-5 days  Therapy recommendations: CIR  Disposition: Pending  Atrial Fibrillation w/ RVR, new onset  Home anticoagulation:  None   On Cardizem drip, will change to digoxin to avoid low blood pressure . Agree with plans  for anticoagulation, consider in 5 to 7 days    Hypertension/hypotension  Stable 110s . Permissive hypertension (OK if < 220/120) but gradually normalize in 5-7 days . Long-term BP goal normotensive  Hyperlipidemia  Home meds: No statin  LDL 121, goal < 70  Add Lipitor 80 once LFTs stablize  Continue statin at discharge  Diabetes type II Controlled  HgbA1c 6.1, goal < 7.0  Dysphagia . Secondary to stroke . Cleared for dysphagia 1 nectar thick liquid diet after modified barium . Speech on board   Other Stroke Risk Factors  Advanced age  Former cigarette smoker  Family hx stroke (mother)  Other Active Problems  Rhabdomyolysis, secondary to fall immediately for x2 days  Hyperbilirubinemia  Leukocytosis  Hypernatremia  Hospital day # 3  She has presented with a large right MCA infarct secondary to new onset atrial fibrillation and cardiogenic embolism.  DC aspirin and change to eliquis    Continue physical occupational therapy  And speech therapy  .  Discussed with Dr. Reesa Chew.  Likely  transfer to inpatient rehab or SNF in the next few days when stable.Stroke Team will sign off. Call for questions. Delia HeadyPramod Sethi, MD Medical Director [facility] Pager: [phone] 09/23/2018 2:20 PM   To contact Stroke Continuity provider, please refer to [email or web address]. After hours, contact General Neurology

## 2018-09-23 NOTE — Progress Notes (Addendum)
Physical Therapy Treatment Patient Details Name: Rachel Fox MRN: [id number] DOB: 1945/07/16 Today's Date: 09/23/2018    History of Present Illness Pt is a 73 y/o female with a PMH significant for HTN, DM. She presents after being found on the floor after ~2 days. CT revealed moderate to large R-sided subacute MCA infarct. Pt was also found to be in a-fib with RVR and with mild rhabdomyolitis.     PT Comments    Pt progressing towards physical therapy goals. Increased time and effort required to attend to L side. Pt was instructed in support of LUE to protect shoulder from subluxation as pt is complaining of lateral shoulder pain. No palpable subluxation was noted this session. Pt also educated on utilizing strong R side to help move and position L side. Pt required assist to hook RLE under LLE to move it off EOB, as well as lift L foot up to place on Stedy. CIR remains appropriate. Will continue to follow.   Follow Up Recommendations  CIR     Equipment Recommendations  Wheelchair (measurements PT);Wheelchair cushion (measurements PT);3in1 (PT);Hospital bed    Recommendations for Other Services Rehab consult     Precautions / Restrictions Precautions Precautions: Fall Precaution Comments: L side weak/flaccid, right gaze preference, L shoulder pain (risk for subluxation), pressure injury from laying on floor PTA Restrictions Weight Bearing Restrictions: No    Mobility  Bed Mobility Overal bed mobility: Needs Assistance Bed Mobility: Rolling;Sidelying to Sit Rolling: Mod assist Sidelying to sit: Mod assist;+2 for physical assistance;HOB elevated Supine to sit: HOB elevated     General bed mobility comments: Rolled to the L. Hand-over-hand assist to reach for railing with strong R arm. +2 assist for trunk elevation to full sitting position at EOB with bed use to scoot hips completely to EOB.   Transfers Overall transfer level: Needs assistance   Transfers: Sit to/from  Stand Sit to Stand: +2 physical assistance;Mod assist         General transfer comment: +2 mod assist to power-up to full stand. Pt utilizing strong RUE to pull to stand from center bar of Stedy. With minimal facilitation from therapist, pt was able to weight shift to R. Frequent cues for weight shift to midline. Pt had difficulty attending to L side, and required increased time/effort for L side gaze.   Ambulation/Gait             General Gait Details: unable at this time.    Stairs             Wheelchair Mobility    Modified Rankin (Stroke Patients Only) Modified Rankin (Stroke Patients Only) Pre-Morbid Rankin Score: No symptoms Modified Rankin: Severe disability     Balance Overall balance assessment: Needs assistance Sitting-balance support: Feet supported;No upper extremity supported;Single extremity supported Sitting balance-Leahy Scale: Poor Sitting balance - Comments: L lateral lean improving with time and practice.  Postural control: Left lateral lean Standing balance support: Single extremity supported Standing balance-Leahy Scale: Zero Standing balance comment: +2 required                            Cognition Arousal/Alertness: Awake/alert Behavior During Therapy: WFL for tasks assessed/performed Overall Cognitive Status: Impaired/Different from baseline Area of Impairment: Attention;Following commands;Safety/judgement;Awareness;Problem solving                   Current Attention Level: Sustained   Following Commands: Follows one step commands consistently(on her R  side) Safety/Judgement: Decreased awareness of safety;Decreased awareness of deficits Awareness: Emergent Problem Solving: Difficulty sequencing;Requires verbal cues;Requires tactile cues        Exercises      General Comments        Pertinent Vitals/Pain Pain Assessment: Faces Faces Pain Scale: Hurts even more Pain Location: L shoulder Pain Descriptors /  Indicators: Grimacing;Guarding Pain Intervention(s): Limited activity within patient's tolerance;Monitored during session;Repositioned    Home Living                      Prior Function            PT Goals (current goals can now be found in the care plan section) Acute Rehab PT Goals Patient Stated Goal: Decrease L shoulder pain PT Goal Formulation: With patient Time For Goal Achievement: 10/05/18 Potential to Achieve Goals: Good Progress towards PT goals: Progressing toward goals    Frequency    Min 4X/week      PT Plan Discharge plan needs to be updated    Co-evaluation PT/OT/SLP Co-Evaluation/Treatment: Yes Reason for Co-Treatment: Complexity of the patient's impairments (multi-system involvement);For patient/therapist safety;To address functional/ADL transfers PT goals addressed during session: Mobility/safety with mobility;Balance;Proper use of DME        AM-PAC PT "6 Clicks" Mobility   Outcome Measure  Help needed turning from your back to your side while in a flat bed without using bedrails?: Total Help needed moving from lying on your back to sitting on the side of a flat bed without using bedrails?: A Lot Help needed moving to and from a bed to a chair (including a wheelchair)?: Total Help needed standing up from a chair using your arms (e.g., wheelchair or bedside chair)?: Total Help needed to walk in hospital room?: Total Help needed climbing 3-5 steps with a railing? : Total 6 Click Score: 7    End of Session Equipment Utilized During Treatment: Gait belt Activity Tolerance: Patient limited by pain;Patient limited by fatigue Patient left: in chair;with call bell/phone within reach;with chair alarm set Nurse Communication: Mobility status;Precautions(Asked to order Geo-Mat, and no more than 2 hr in chair) PT Visit Diagnosis: Other symptoms and signs involving the nervous system (R29.898);Hemiplegia and hemiparesis Hemiplegia - Right/Left:  Left Hemiplegia - dominant/non-dominant: Non-dominant Hemiplegia - caused by: Cerebral infarction     Time: 5809-9833 PT Time Calculation (min) (ACUTE ONLY): 28 min  Charges:  $Therapeutic Activity: 8-22 mins                     Rolinda Roan, PT, DPT Acute Rehabilitation Services Pager: [phone] Office: [phone]    Thelma Comp 09/23/2018, 12:04 PM

## 2018-09-23 NOTE — Progress Notes (Signed)
Inpatient Rehabilitation Admissions Coordinator  Inpatient Rehab Consult received. I met with patient at the bedside for rehabilitation assessment. We discussed goals and expectations of an inpatient rehab admission.  She request I call her son, Sonia Side, as well as niece DeeDee to discuss her rehab goals. I will contact both. Noted cardiology issues ongoing for rate control.I feel patient is a candidate for CIR admit pending medical readiness to admit. I will follow.  Danne Baxter, RN, MSN Rehab Admissions Coordinator [phone] 09/23/2018 2:06 PM

## 2018-09-24 LAB — GLUCOSE, CAPILLARY
Glucose-Capillary: 105 mg/dL — ABNORMAL HIGH (ref 70–99)
Glucose-Capillary: 106 mg/dL — ABNORMAL HIGH (ref 70–99)
Glucose-Capillary: 110 mg/dL — ABNORMAL HIGH (ref 70–99)
Glucose-Capillary: 112 mg/dL — ABNORMAL HIGH (ref 70–99)

## 2018-09-24 LAB — BASIC METABOLIC PANEL
Anion gap: 6 (ref 5–15)
BUN: 8 mg/dL (ref 8–23)
CO2: 27 mmol/L (ref 22–32)
Calcium: 8.7 mg/dL — ABNORMAL LOW (ref 8.9–10.3)
Chloride: 110 mmol/L (ref 98–111)
Creatinine, Ser: 0.75 mg/dL (ref 0.44–1.00)
GFR calc Af Amer: >60 mL/min (ref >60)
GFR calc non Af Amer: >60 mL/min (ref >60)
Glucose, Bld: 105 mg/dL — ABNORMAL HIGH (ref 70–99)
Potassium: 4.7 mmol/L (ref 3.5–5.1)
Sodium: 143 mmol/L (ref 135–145)

## 2018-09-24 LAB — CBC
HCT: 40.1 % (ref 36.0–46.0)
Hemoglobin: 12.6 g/dL (ref 12.0–15.0)
MCH: 27.2 pg (ref 26.0–34.0)
MCHC: 31.4 g/dL (ref 30.0–36.0)
MCV: 86.4 fL (ref 80.0–100.0)
Platelets: 188 10*3/uL (ref 150–400)
RBC: 4.64 MIL/uL (ref 3.87–5.11)
RDW: 13.7 % (ref 11.5–15.5)
WBC: 9.7 10*3/uL (ref 4.0–10.5)
nRBC: 0 % (ref 0.0–0.2)

## 2018-09-24 LAB — MAGNESIUM: Magnesium: 1.7 mg/dL (ref 1.7–2.4)

## 2018-09-24 LAB — CK: Total CK: 462 U/L — ABNORMAL HIGH (ref 38–234)

## 2018-09-24 MED ORDER — DILTIAZEM HCL 30 MG PO TABS
90.0000 mg | ORAL_TABLET | Freq: Four times a day (QID) | ORAL | Status: DC
  Administered 2018-09-24 – 2018-09-27 (×13): 90 mg via ORAL
  Filled 2018-09-24 (×14): qty 3

## 2018-09-24 MED ORDER — COLLAGENASE 250 UNIT/GM EX OINT
TOPICAL_OINTMENT | Freq: Every day | CUTANEOUS | Status: DC
  Administered 2018-09-24 – 2018-09-27 (×4): via TOPICAL
  Filled 2018-09-24: qty 30

## 2018-09-24 MED ORDER — DILTIAZEM HCL 30 MG PO TABS
60.0000 mg | ORAL_TABLET | Freq: Four times a day (QID) | ORAL | Status: DC
  Administered 2018-09-24: 60 mg via ORAL
  Filled 2018-09-24: qty 2

## 2018-09-24 NOTE — Progress Notes (Signed)
Inpatient Rehabilitation Admissions Coordinator  I met with patient at bedside resting in bed. Heart rate 120's. I await better rate control before pursuing admit to inpt rehab . I will follow up on Monday. I have discussed with Dr. Reesa Chew.  Danne Baxter, RN, MSN Rehab Admissions Coordinator [phone] 09/24/2018 10:22 AM

## 2018-09-24 NOTE — Progress Notes (Signed)
Drug-Drug Interaction Report  Apixaban / Moderate CYP3A4 Inhibitors   Significance: Moderate   Warning: Pharmacologic effects and plasma concentrations of apixaban may be increased by moderate CYP3A4 inhibitors (e.g. diltiazem).  Onset: DelayedDocumentation Level: Suspected  Interacting Medications/Orders: Apixaban Oral or Non-Oral, Systemic Moderate CYP3A4 Inhibitors Oral or Non-Oral, Systemic  1. apixaban  Order ([id number]): apixaban (ELIQUIS) tablet 5 mg Route: Oral Start: 09/23/2018 1430 End: none Frequency: 2 times daily 1. diltiazem  Order ([id number]): diltiazem (CARDIZEM) tablet 90 mg Route: Oral Start: 09/24/2018 1400 End: none Frequency: 4 times daily   Management Code: Potential interaction risk - additional information available  Effects: Pharmacologic effects and plasma concentrations of apixaban may be increased by moderate CYP3A4 inhibitors (e.g. diltiazem).  Mechanism: Inhibition of CYP3A4 by moderate CYP3A4 inhibitors may decrease metabolic elimination of apixaban.  Management: Monitor patients receiving apixaban in combination with moderate CYP3A4 inhibitors for evidence of bleeding, and counsel patients about the need to promptly report any signs or symptoms of possible bleeding.  Rober Minion, PharmD., MS Clinical Pharmacist Pager:  [phone] Thank you for allowing pharmacy to be part of this patients care team.

## 2018-09-24 NOTE — Progress Notes (Signed)
Physical Therapy Treatment Patient Details Name: Rachel Fox MRN: [id number] DOB: October 05, 1945 Today's Date: 09/24/2018    History of Present Illness Pt is a 73 y/o female with a PMH significant for HTN, DM. She presents after being found on the floor after ~2 days. CT revealed moderate to large R-sided subacute MCA infarct. Pt was also found to be in a-fib with RVR and with mild rhabdomyolitis.     PT Comments    Patient seen for mobility progression. Pt requires +2 assist for functional transfer training this session and +2 for standing balance utilizing Stedy standing frame. Continue to recommend CIR for further skilled PT services to maximize independence and safety with mobility.    Follow Up Recommendations  CIR     Equipment Recommendations  Wheelchair (measurements PT);Wheelchair cushion (measurements PT);3in1 (PT);Hospital bed    Recommendations for Other Services       Precautions / Restrictions Precautions Precautions: Fall Precaution Comments: L side weak/flaccid, right gaze preference, L shoulder pain (risk for subluxation), pressure injury from laying on floor PTA    Mobility  Bed Mobility Overal bed mobility: Needs Assistance Bed Mobility: Rolling;Sidelying to Sit Rolling: Mod assist Sidelying to sit: Mod assist;+2 for physical assistance;HOB elevated       General bed mobility comments: assist to bring bilat from EOB and to elevate trunk into sitting; cues for sequencing and use of rail  Transfers Overall transfer level: Needs assistance   Transfers: Sit to/from Stand Sit to Stand: Mod assist;+2 physical assistance         General transfer comment: pt stood X 3 trials utilizing Stedy standing frame; pt stood for RN to assess pressure wound on buttocks; L UE supported by therapist  Ambulation/Gait                 Stairs             Wheelchair Mobility    Modified Rankin (Stroke Patients Only) Modified Rankin (Stroke Patients  Only) Modified Rankin: Severe disability     Balance Overall balance assessment: Needs assistance Sitting-balance support: Feet supported;No upper extremity supported;Single extremity supported Sitting balance-Leahy Scale: Poor Sitting balance - Comments: pt progressing to min guard assist with cues for correcting posture to midline  Postural control: Left lateral lean Standing balance support: Single extremity supported Standing balance-Leahy Scale: Poor                              Cognition Arousal/Alertness: Awake/alert Behavior During Therapy: WFL for tasks assessed/performed Overall Cognitive Status: Impaired/Different from baseline Area of Impairment: Attention;Following commands;Safety/judgement;Awareness;Problem solving                   Current Attention Level: Sustained   Following Commands: Follows one step commands consistently(on R side) Safety/Judgement: Decreased awareness of safety;Decreased awareness of deficits Awareness: Emergent Problem Solving: Difficulty sequencing;Requires verbal cues;Requires tactile cues        Exercises      General Comments General comments (skin integrity, edema, etc.): pt with, what appears to be, unstageable pressure wound on buttocks and RN called in to assess and change bandage       Pertinent Vitals/Pain Pain Assessment: Faces Faces Pain Scale: Hurts little more Pain Location: L shoulder Pain Descriptors / Indicators: Grimacing;Guarding Pain Intervention(s): Monitored during session;Repositioned    Home Living  Prior Function            PT Goals (current goals can now be found in the care plan section) Progress towards PT goals: Progressing toward goals    Frequency    Min 4X/week      PT Plan Current plan remains appropriate    Co-evaluation              AM-PAC PT "6 Clicks" Mobility   Outcome Measure  Help needed turning from your back to your  side while in a flat bed without using bedrails?: A Lot Help needed moving from lying on your back to sitting on the side of a flat bed without using bedrails?: A Lot Help needed moving to and from a bed to a chair (including a wheelchair)?: A Lot Help needed standing up from a chair using your arms (e.g., wheelchair or bedside chair)?: A Lot Help needed to walk in hospital room?: Total Help needed climbing 3-5 steps with a railing? : Total 6 Click Score: 10    End of Session Equipment Utilized During Treatment: Gait belt Activity Tolerance: Patient tolerated treatment well Patient left: in chair;with call bell/phone within reach;with chair alarm set Nurse Communication: Mobility status PT Visit Diagnosis: Other symptoms and signs involving the nervous system (R29.898);Hemiplegia and hemiparesis Hemiplegia - Right/Left: Left Hemiplegia - dominant/non-dominant: Non-dominant Hemiplegia - caused by: Cerebral infarction     Time: 1400-1430 PT Time Calculation (min) (ACUTE ONLY): 30 min  Charges:  $Gait Training: 8-22 mins $Therapeutic Activity: 8-22 mins                     Earney Navy, PTA Acute Rehabilitation Services Pager: [phone] Office: [phone]     Darliss Cheney 09/24/2018, 4:26 PM

## 2018-09-24 NOTE — Progress Notes (Signed)
Progress Note  Patient Name: Rachel Fox Date of Encounter: 09/24/2018  Primary Cardiologist: Dina RichBranch, Jonathan, MD   Subjective   Feeling well.  Eager to get home.  Eager to try to resume her walking routine that she does daily she states.  Asymptomatic tachycardia.  Inpatient Medications    Scheduled Meds: .  stroke: mapping our early stages of recovery book   Does not apply Once  . amiodarone  200 mg Oral BID  . apixaban  5 mg Oral BID  . atorvastatin  80 mg Oral q1800  . digoxin  0.125 mg Oral Daily  . diltiazem  60 mg Oral QID  . insulin aspart  0-9 Units Subcutaneous TID WC   Continuous Infusions: . 0.9 % NaCl with KCl 20 mEq / L 125 mL/hr at 09/24/18 0027   PRN Meds: acetaminophen **OR** acetaminophen (TYLENOL) oral liquid 160 mg/5 mL **OR** acetaminophen, alum & mag hydroxide-simeth, guaiFENesin-dextromethorphan, hydrocortisone, hydrocortisone cream, ipratropium-albuterol, lip balm, loratadine, metoprolol tartrate, Muscle Rub, phenol, polyethylene glycol, polyvinyl alcohol, prochlorperazine, Resource ThickenUp Clear, senna-docusate, sodium chloride   Vital Signs    Vitals:   09/23/18 2154 09/23/18 2340 09/24/18 0358 09/24/18 0730  BP: 119/71     Pulse:  97 89   Resp:      Temp:  97.6 F (36.4 C) 97.9 F (36.6 C) 97.7 F (36.5 C)  TempSrc:  Oral Oral Oral  SpO2:  99% 99%   Weight:      Height:        Intake/Output Summary (Last 24 hours) at 09/24/2018 0850 Last data filed at 09/24/2018 [id number] Gross per 24 hour  Intake -  Output 1550 ml  Net -1550 ml   Last 3 Weights 09/20/2018 09/20/2018  Weight (lbs) 160 lb 7.9 oz 156 lb  Weight (kg) 72.8 kg 70.761 kg      Telemetry    Atrial fibrillation slightly better control over the last 24 hours, during sleep slightly less than 100- Personally Reviewed  ECG    No new- Personally Reviewed  Physical Exam   GEN: No acute distress.   Neck: No JVD Cardiac:  Irregularly irregular, no murmurs, rubs, or  gallops.  Respiratory: Clear to auscultation bilaterally. GI: Soft, nontender, non-distended  MS: No edema; No deformity. Neuro:  Nonfocal, slight left facial droop Psych: Normal affect   Labs    High Sensitivity Troponin:   Recent Labs  Lab 09/20/18 1142  TROPONINIHS 19.00*      Cardiac EnzymesNo results for input(s): TROPONINI in the last 168 hours. No results for input(s): TROPIPOC in the last 168 hours.   Chemistry Recent Labs  Lab 09/20/18 1142 09/22/18 0313 09/23/18 0419 09/24/18 0359  NA 146* 141 143 143  K 3.8 4.0 4.5 4.7  CL 107 108 111 110  CO2 23 26 25 27   GLUCOSE 132* 114* 104* 105*  BUN 31* 12 13 8   CREATININE 1.04* 0.92 0.69 0.75  CALCIUM 10.4* 8.5* 8.5* 8.7*  PROT 8.5*  --   --   --   ALBUMIN 4.2  --   --   --   AST 63*  --   --   --   ALT 30  --   --   --   ALKPHOS 61  --   --   --   BILITOT 1.3*  --   --   --   GFRNONAA 54* >60 >60 >60  GFRAA >60 >60 >60 >60  ANIONGAP 16* 7 7 6  Hematology Recent Labs  Lab 09/22/18 0313 09/23/18 0419 09/24/18 0359  WBC 11.5* 10.9* 9.7  RBC 5.06 4.89 4.64  HGB 13.8 13.3 12.6  HCT 43.7 42.0 40.1  MCV 86.4 85.9 86.4  MCH 27.3 27.2 27.2  MCHC 31.6 31.7 31.4  RDW 13.8 13.5 13.7  PLT 175 181 188    BNP Recent Labs  Lab 09/20/18 1142  BNP 485.0*     DDimer No results for input(s): DDIMER in the last 168 hours.   Radiology    No results found.  Cardiac Studies   Echo-EF greater than 65%  Patient Profile     73 y.o. female with acute stroke, new onset atrial fibrillation persistent with hypertension hyperlipidemia diabetes  Assessment & Plan    Persistent atrial fibrillation - Rate control is improved.  Asymptomatic. - I will go ahead and increase her diltiazem to 60 mg 4 times a day.  She is unable to take long-acting pill because her medicines need to be crushed. -Eliquis has been started for anticoagulation appreciate neurology's assistance. -Continuing with amiodarone 200 mg twice  a day -Digoxin 0.125 mg once a day.  Mildly elevated troponin - Consistent with stroke.  Not ACS.  Also can be seen in the setting of atrial fibrillation with rapid ventricular response.  Essential hypertension - Overall very reasonably controlled.  It appears today that she has enough blood pressure to warrant an increase in her diltiazem to provide better rate control.  Hyperlipidemia -High intensity statin atorvastatin 80 mg.  LDL 121.  Repeat as outpatient.  I think from a cardiac perspective since she is showing better rate control, it would not be unreasonable for her to be discharged to rehabilitation.  It may take several more days for medications to continue to improve her overall heart rate.  Thankfully, she is not having any symptoms with this.  We will set up follow-up appointment.      For questions or updates, please contact [place] Please consult [email or web address] for contact info under        Signed, Candee Furbish, MD  09/24/2018, 8:50 AM

## 2018-09-24 NOTE — Progress Notes (Signed)
PROGRESS NOTE    Rachel PageBarbara C Fox  UUV:[id number] DOB: 11/20/1945 DOA: 09/20/2018 PCP: Benita StabileHall, John Z, MD   Brief Narrative:  73 year old with history of atrial fibrillation, diabetes mellitus type 2, essential hypertension who was brought to the hospital for evaluation of fall.  She was found to be in mild rhabdomyolysis, atrial fibrillation with RVR and also a large right-sided CVA on the CT of the head.  Neurology consulted who recommended starting Eliquis 3 to 5 days after stroke.  Patient was also seen by cardiology due to uncontrolled atrial fibrillation with RVR, digoxin and amiodarone were added.  Physical therapy recommended SNF/CIR. rehab team has been consulted.   Assessment & Plan:   Principal Problem:   Cerebrovascular accident (CVA) with involvement of left side of body (HCC) Active Problems:   Atrial fibrillation with RVR (HCC)   Hypertension   Rhabdomyolysis   Type 2 diabetes mellitus (HCC)   Hyperlipidemia   Hyperbilirubinemia   Leukocytosis   Hypernatremia   Pressure injury of skin  Moderate to large right-sided acute MCA stroke, embolic -MRI brain without contrast and MRA head and neck without contrast (given deranged renal function) -Maintain Euthermia.  -ASA ordered.  -Echocardiogram-ejection fraction 65% in atrial fibrillation -LDL 121, Lipitor 80 mg daily.  Hemoglobin A1c 6.1. -Frequent neuro checks -Atorvastatin. -Risk factor modification -Neurology team following -PT/OT eval, Speech consult-appreciate input -Plan will be to start Eliquis today per neurology.  Atrial fibrillation with RVR, heart rate still elevated 120s - Eliquis resumed. - Cardiology team is following.  Continue digoxin and amiodarone.  Cardizem orally has been added. Expect rate to improve over days to come.   Mild rhabdomyolysis -Gentle hydration.  Hypernatremia, resolved -Likely from dehydration.  Continue to follow sodium levels.  Diabetes mellitus type 2 Hypoglycemia  secondary to poor oral intake - Metformin on hold.  Accu-Chek sliding scale  Essential hypertension -Micardis and amlodipine on hold due to permissive hypertension.  This will also allow to adjust Cardizem for rate control.   DVT prophylaxis: SCDs Code Status: Full code none Family Communication: None at bedside, Spoke with DeeDee yesterday.  Disposition Plan: Transition to CIR over next 24 hrs, Will work towards better rate control.  Consultants:   Cardiology  Neurology  Procedures:   None  Antimicrobials:   None   Subjective: HR still going up to 120. Overall feels better, but still have Left sided hemiparesis.   Review of Systems Otherwise negative except as per HPI, including: General = no fevers, chills, dizziness, malaise, fatigue HEENT/EYES = negative for pain, redness, loss of vision, double vision, blurred vision, loss of hearing, sore throat, hoarseness, dysphagia Cardiovascular= negative for chest pain, palpitation, murmurs, lower extremity swelling Respiratory/lungs= negative for shortness of breath, cough, hemoptysis, wheezing, mucus production Gastrointestinal= negative for nausea, vomiting,, abdominal pain, melena, hematemesis Genitourinary= negative for Dysuria, Hematuria, Change in Urinary Frequency MSK = Negative for arthralgia, myalgias, Back Pain, Joint swelling  Neurology= Negative for headache, seizures, numbness, tingling  Psychiatry= Negative for anxiety, depression, suicidal and homocidal ideation Allergy/Immunology= Medication/Food allergy as listed  Skin= Negative for Rash, lesions, ulcers, itching    Objective: Vitals:   09/23/18 2154 09/23/18 2340 09/24/18 0358 09/24/18 0730  BP: 119/71   131/80  Pulse:  97 89   Resp:      Temp:  97.6 F (36.4 C) 97.9 F (36.6 C) 97.7 F (36.5 C)  TempSrc:  Oral Oral Oral  SpO2:  99% 99%   Weight:  Height:        Intake/Output Summary (Last 24 hours) at 09/24/2018 0947 Last data filed at  09/24/2018 3295 Gross per 24 hour  Intake -  Output 1550 ml  Net -1550 ml   Filed Weights   09/20/18 1100 09/20/18 1942  Weight: 70.8 kg 72.8 kg    Examination:  Constitutional: NAD, calm, comfortable Eyes: PERRL, lids and conjunctivae normal ENMT: Mucous membranes are moist. Posterior pharynx clear of any exudate or lesions.Normal dentition.  Neck: normal, supple, no masses, no thyromegaly Respiratory: clear to auscultation bilaterally, no wheezing, no crackles. Normal respiratory effort. No accessory muscle use.  Cardiovascular: Regular rate and rhythm, no murmurs / rubs / gallops. No extremity edema. 2+ pedal pulses. No carotid bruits.  Abdomen: no tenderness, no masses palpated. No hepatosplenomegaly. Bowel sounds positive.  Musculoskeletal: no clubbing / cyanosis. No joint deformity upper and lower extremities. Good ROM, no contractures. Normal muscle tone.  Skin: no rashes, lesions, ulcers. No induration Neurologic: Left-sided hemiparesis noted.  Left-sided strength upper and lower extremity 1/5.  Right side strength upper and lower extremity is 4/5.  Left-sided facial droop. Psychiatric: Normal judgment and insight. Alert and oriented x 3. Normal mood.     Data Reviewed:   CBC: Recent Labs  Lab 09/20/18 1130 09/20/18 1142 09/21/18 1120 09/22/18 0313 09/23/18 0419 09/24/18 0359  WBC  --  14.0*  --  11.5* 10.9* 9.7  NEUTROABS  --  11.5*  --   --   --   --   HGB 17.7* 15.5*  --  13.8 13.3 12.6  HCT 52.0* 49.2* 44.1 43.7 42.0 40.1  MCV  --  84.7  --  86.4 85.9 86.4  PLT  --  207  --  175 181 188   Basic Metabolic Panel: Recent Labs  Lab 09/20/18 1130 09/20/18 1142 09/22/18 0313 09/23/18 0419 09/24/18 0359  NA 149* 146* 141 143 143  K 4.4 3.8 4.0 4.5 4.7  CL 112* 107 108 111 110  CO2  --  23 26 25 27   GLUCOSE 128* 132* 114* 104* 105*  BUN 34* 31* 12 13 8   CREATININE 1.00 1.04* 0.92 0.69 0.75  CALCIUM  --  10.4* 8.5* 8.5* 8.7*  MG  --  2.3 1.9 1.9 1.7   PHOS  --  2.7  --   --   --    GFR: Estimated Creatinine Clearance: 65.9 mL/min (by C-G formula based on SCr of 0.75 mg/dL). Liver Function Tests: Recent Labs  Lab 09/20/18 1142  AST 63*  ALT 30  ALKPHOS 61  BILITOT 1.3*  PROT 8.5*  ALBUMIN 4.2   No results for input(s): LIPASE, AMYLASE in the last 168 hours. No results for input(s): AMMONIA in the last 168 hours. Coagulation Profile: Recent Labs  Lab 09/20/18 1142  INR 1.1   Cardiac Enzymes: Recent Labs  Lab 09/20/18 1142 09/21/18 0525 09/22/18 0313 09/23/18 0419 09/24/18 0359  CKTOTAL 2,651* 2,883* 1,637* 789* 462*   BNP (last 3 results) No results for input(s): PROBNP in the last 8760 hours. HbA1C: Recent Labs    09/21/18 1120  HGBA1C 6.1*   CBG: Recent Labs  Lab 09/23/18 0614 09/23/18 1152 09/23/18 1619 09/23/18 2130 09/24/18 0605  GLUCAP 119* 145* 83 100* 105*   Lipid Profile: Recent Labs    09/21/18 1120  CHOL 184  HDL 42  LDLCALC 121*  TRIG 107  CHOLHDL 4.4   Thyroid Function Tests: Recent Labs    09/21/18 1120  TSH 1.655   Anemia Panel: Recent Labs    09/21/18 1120  VITAMINB12 1,052*   Sepsis Labs: No results for input(s): PROCALCITON, LATICACIDVEN in the last 168 hours.  Recent Results (from the past 240 hour(s))  SARS Coronavirus 2 (CEPHEID - Performed in [facility] lab), Hosp Order     Status: None   Collection Time: 09/20/18 11:10 AM   Specimen: Nasopharyngeal Swab  Result Value Ref Range Status   SARS Coronavirus 2 NEGATIVE NEGATIVE Final    Comment: (NOTE) If result is NEGATIVE SARS-CoV-2 target nucleic acids are NOT DETECTED. The SARS-CoV-2 RNA is generally detectable in upper and lower  respiratory specimens during the acute phase of infection. The lowest  concentration of SARS-CoV-2 viral copies this assay can detect is 250  copies / mL. A negative result does not preclude SARS-CoV-2 infection  and should not be used as the sole basis for treatment or  other  patient management decisions.  A negative result may occur with  improper specimen collection / handling, submission of specimen other  than nasopharyngeal swab, presence of viral mutation(s) within the  areas targeted by this assay, and inadequate number of viral copies  (<250 copies / mL). A negative result must be combined with clinical  observations, patient history, and epidemiological information. If result is POSITIVE SARS-CoV-2 target nucleic acids are DETECTED. The SARS-CoV-2 RNA is generally detectable in upper and lower  respiratory specimens dur ing the acute phase of infection.  Positive  results are indicative of active infection with SARS-CoV-2.  Clinical  correlation with patient history and other diagnostic information is  necessary to determine patient infection status.  Positive results do  not rule out bacterial infection or co-infection with other viruses. If result is PRESUMPTIVE POSTIVE SARS-CoV-2 nucleic acids MAY BE PRESENT.   A presumptive positive result was obtained on the submitted specimen  and confirmed on repeat testing.  While 2019 novel coronavirus  (SARS-CoV-2) nucleic acids may be present in the submitted sample  additional confirmatory testing may be necessary for epidemiological  and / or clinical management purposes  to differentiate between  SARS-CoV-2 and other Sarbecovirus currently known to infect humans.  If clinically indicated additional testing with an alternate test  methodology [phone]) is advised. The SARS-CoV-2 RNA is generally  detectable in upper and lower respiratory sp ecimens during the acute  phase of infection. The expected result is Negative. Fact Sheet for Patients:  BoilerBrush.com.cyhttps://www.fda.gov/media/136312/download Fact Sheet for Healthcare Providers: https://pope.com/https://www.fda.gov/media/136313/download This test is not yet approved or cleared by the [place] FDA and has been authorized for detection and/or diagnosis of  SARS-CoV-2 by FDA under an Emergency Use Authorization (EUA).  This EUA will remain in effect (meaning this test can be used) for the duration of the COVID-19 declaration under Section 564(b)(1) of the Act, 21 U.S.C. section 360bbb-3(b)(1), unless the authorization is terminated or revoked sooner. Performed at [facility], [address] [place], [place] [address]   Urine culture     Status: None   Collection Time: 09/20/18 12:46 PM   Specimen: Urine, Clean Catch  Result Value Ref Range Status   Specimen Description   Final    URINE, CLEAN CATCH Performed at [facility], [address] [place], [place] [address]    Special Requests   Final    NONE Performed at [facility], [address] [place], [place] [address]    Culture   Final    Multiple bacterial morphotypes present, none predominant.  Suggest appropriate recollection if clinically indicated.   Report Status 09/21/2018 FINAL  Final         Radiology Studies: No results found.      Scheduled Meds: .  stroke: mapping our early stages of recovery book   Does not apply Once  . amiodarone  200 mg Oral BID  . apixaban  5 mg Oral BID  . atorvastatin  80 mg Oral q1800  . digoxin  0.125 mg Oral Daily  . diltiazem  60 mg Oral QID  . insulin aspart  0-9 Units Subcutaneous TID WC   Continuous Infusions: . 0.9 % NaCl with KCl 20 mEq / L 125 mL/hr at 09/24/18 0859     LOS: 4 days   Time spent= 25 mins    Jabaree Mercado Joline Maxcyhirag Cowen Pesqueira, MD [facility]  If 7PM-7AM, please contact night-coverage [email or web address] 09/24/2018, 9:47 AM

## 2018-09-24 NOTE — Discharge Instructions (Signed)

## 2018-09-24 NOTE — Progress Notes (Signed)
  Speech Language Pathology Treatment: Dysphagia  Patient Details Name: Rachel Fox MRN: [id number] DOB: 1945-05-18 Today's Date: 09/24/2018 Time: 9675-9163 SLP Time Calculation (min) (ACUTE ONLY): 32 min  Assessment / Plan / Recommendation Clinical Impression  Patient seen for dysphagia and cognitive-linguistic therapy today. Trials of Dys 2 (peaches in syrup) and Dys 3 (cracker) given today. Pt tolerated both consistencies well with moderate cues for small bites, and tongue sweep on left side. She expressed a fear to advance diet, worried about a chopped diet not being small enough. Will hold diet upgrade, but feel she will do well with upgrade next week. Continue dys 1, nectar thickened liquid. No clearing of wet vocal quality noted. Pt's speech was 75-100% intelligible when using single words or phrases, but decreased to 50% when she used sentences. Moderate cues provided to slow speech and over articulate resulted in increase intelligibility to 75-100%. Pt was able to communicate at the conversational level the events leading up to her stroke. Her living situation and plan for discharge. She expressed her need for therapy and assistance when leaving hospital. Cognition is judged to be improved. ST to continue to target dysarthria and dysphagia.    HPI HPI: 73 year old with history of atrial fibrillation, diabetes mellitus type 2, essential hypertension who was brought to the hospital for evaluation of fall.  She was found to be in mild rhabdomyolysis, atrial fibrillation with RVR and also a large right-sided MCA CVA on the CT of the head      SLP Plan  Continue with current plan of care       Recommendations  Diet recommendations: Dysphagia 1 (puree);Nectar-thick liquid Liquids provided via: Straw Medication Administration: Crushed with puree Compensations: Lingual sweep for clearance of pocketing;Small sips/bites Postural Changes and/or Swallow Maneuvers: Seated upright 90 degrees                 Oral Care Recommendations: Oral care BID Follow up Recommendations: Inpatient Rehab SLP Visit Diagnosis: Dysphagia, oropharyngeal phase (R13.12) Plan: Continue with current plan of care       [place], MA, CCC-SLP 09/24/2018 9:26 AM

## 2018-09-24 NOTE — Consult Note (Signed)
[facility] Nurse wound consult note Reason for Consult: Consult requested for left buttock wound.  This was noted as a stage 2 pressure injury, present on admission but has declined to unstageable upon assessment today.  Pt fell at home and was found down for an unknown period of time prior to admission and appearance and location of this pressure injury are consistent with a pressure injury which occurred related to the fall. Wound type: Unstageable pressure injury to left buttock Pressure Injury POA: Yes Measurement: 7X7cm Wound bed: 10% red, 90% black eschar, tightly adhered, with loose peeling skin surrounding Drainage (amount, consistency, odor) no odor, drainage, or fluctance Periwound: intact skin surrounding Dressing procedure/placement/frequency: Santyl ointment to provide enzymatic debridement of nonviable tissue.  Please re-consult if further assistance is needed.  Thank-you,  Julien Girt MSN, [place], [place], [place], [place]

## 2018-09-25 LAB — BASIC METABOLIC PANEL
Anion gap: 11 (ref 5–15)
BUN: 8 mg/dL (ref 8–23)
CO2: 21 mmol/L — ABNORMAL LOW (ref 22–32)
Calcium: 9 mg/dL (ref 8.9–10.3)
Chloride: 109 mmol/L (ref 98–111)
Creatinine, Ser: 0.7 mg/dL (ref 0.44–1.00)
GFR calc Af Amer: >60 mL/min (ref >60)
GFR calc non Af Amer: >60 mL/min (ref >60)
Glucose, Bld: 118 mg/dL — ABNORMAL HIGH (ref 70–99)
Potassium: 4.3 mmol/L (ref 3.5–5.1)
Sodium: 141 mmol/L (ref 135–145)

## 2018-09-25 LAB — MAGNESIUM: Magnesium: 1.7 mg/dL (ref 1.7–2.4)

## 2018-09-25 LAB — CBC
HCT: 40.6 % (ref 36.0–46.0)
Hemoglobin: 13.1 g/dL (ref 12.0–15.0)
MCH: 27.1 pg (ref 26.0–34.0)
MCHC: 32.3 g/dL (ref 30.0–36.0)
MCV: 84.1 fL (ref 80.0–100.0)
Platelets: 196 10*3/uL (ref 150–400)
RBC: 4.83 MIL/uL (ref 3.87–5.11)
RDW: 13.4 % (ref 11.5–15.5)
WBC: 9.2 10*3/uL (ref 4.0–10.5)
nRBC: 0 % (ref 0.0–0.2)

## 2018-09-25 LAB — CK: Total CK: 358 U/L — ABNORMAL HIGH (ref 38–234)

## 2018-09-25 LAB — GLUCOSE, CAPILLARY
Glucose-Capillary: 123 mg/dL — ABNORMAL HIGH (ref 70–99)
Glucose-Capillary: 96 mg/dL (ref 70–99)
Glucose-Capillary: 105 mg/dL — ABNORMAL HIGH (ref 70–99)
Glucose-Capillary: 120 mg/dL — ABNORMAL HIGH (ref 70–99)

## 2018-09-25 MED ORDER — MAGNESIUM SULFATE 2 GM/50ML IV SOLN
2.0000 g | Freq: Once | INTRAVENOUS | Status: AC
  Administered 2018-09-25: 2 g via INTRAVENOUS
  Filled 2018-09-25: qty 50

## 2018-09-25 NOTE — Progress Notes (Addendum)
PROGRESS NOTE    Rachel PageBarbara C Gallant  ZOX:[id number] DOB: 1945/12/18 DOA: 09/20/2018 PCP: Benita StabileHall, John Z, MD   Brief Narrative:  73 year old with history of atrial fibrillation, diabetes mellitus type 2, essential hypertension who was brought to the hospital for evaluation of fall.  She was found to be in mild rhabdomyolysis, atrial fibrillation with RVR and also a large right-sided CVA on the CT of the head.  Neurology consulted who recommended starting Eliquis 3 to 5 days after stroke.  Patient was also seen by cardiology due to uncontrolled atrial fibrillation with RVR, digoxin and amiodarone were added.  Physical therapy recommended SNF/CIR. rehab team has been consulted.   Assessment & Plan:   Principal Problem:   Cerebrovascular accident (CVA) with involvement of left side of body (HCC) Active Problems:   Atrial fibrillation with RVR (HCC)   Hypertension   Rhabdomyolysis   Type 2 diabetes mellitus (HCC)   Hyperlipidemia   Hyperbilirubinemia   Leukocytosis   Hypernatremia   Pressure injury of skin  Moderate to large right-sided acute MCA stroke, embolic -MRI brain without contrast and MRA head and neck without contrast (given deranged renal function) -Maintain Euthermia.  -ASA ordered.  -Echocardiogram-ejection fraction 65% in atrial fibrillation -LDL 121, Lipitor 80 mg daily.  Hemoglobin A1c 6.1. -Frequent neuro checks -Atorvastatin. -Risk factor modification -Neurology team following- appreciate input.  -PT/OT eval, Speech consult-appreciate input -Eliquis started.   Atrial fibrillation with RVR, heart rate still elevated 120s - Eliquis resumed. - Cardiology team is following.  Continue digoxin and amiodarone.  Cardizem orally has been added. Expect rate to improve over days to come.   Mild rhabdomyolysis -Gentle hydration.  Hypernatremia, resolved -Likely from dehydration.  Continue to follow sodium levels.  Diabetes mellitus type 2 Hypoglycemia secondary to poor  oral intake - Metformin on hold.  Accu-Chek sliding scale  Essential hypertension -Micardis and amlodipine on hold due to permissive hypertension.  This will also allow to adjust Cardizem for rate control.   DVT prophylaxis: SCDs Code Status: Full code none Family Communication: None at bedside, Spoke with DeeDee today Disposition Plan: Plans for CIR once HR has improved.   Consultants:   Cardiology  Neurology  Procedures:   None  Antimicrobials:   None   Subjective: HR improved.  No complaints.   Review of Systems Otherwise negative except as per HPI, including: General = no fevers, chills, dizziness, malaise, fatigue HEENT/EYES = negative for pain, redness, loss of vision, double vision, blurred vision, loss of hearing, sore throat, hoarseness, dysphagia Cardiovascular= negative for chest pain, palpitation, murmurs, lower extremity swelling Respiratory/lungs= negative for shortness of breath, cough, hemoptysis, wheezing, mucus production Gastrointestinal= negative for nausea, vomiting,, abdominal pain, melena, hematemesis Genitourinary= negative for Dysuria, Hematuria, Change in Urinary Frequency MSK = Negative for arthralgia, myalgias, Back Pain, Joint swelling  Neurology= Negative for headache, seizures, numbness, tingling  Psychiatry= Negative for anxiety, depression, suicidal and homocidal ideation Allergy/Immunology= Medication/Food allergy as listed  Skin= Negative for Rash, lesions, ulcers, itching   Objective: Vitals:   09/24/18 2010 09/25/18 0010 09/25/18 0410 09/25/18 0732  BP: 128/80 111/73 134/60 122/63  Pulse: (!) 101 97 99   Resp: 18 15 15    Temp: 98.3 F (36.8 C) 98.6 F (37 C) 98.1 F (36.7 C) 98 F (36.7 C)  TempSrc: Oral Oral Oral Oral  SpO2: 100% 99% 97% 100%  Weight:      Height:        Intake/Output Summary (Last 24 hours) at 09/25/2018 1102  Last data filed at 09/25/2018 0844 Gross per 24 hour  Intake 120 ml  Output -  Net 120 ml    Filed Weights   09/20/18 1100 09/20/18 1942  Weight: 70.8 kg 72.8 kg    Examination: Constitutional: NAD, calm, comfortable Eyes: PERRL, lids and conjunctivae normal ENMT: Mucous membranes are moist. Posterior pharynx clear of any exudate or lesions.Normal dentition.  Neck: normal, supple, no masses, no thyromegaly Respiratory: clear to auscultation bilaterally, no wheezing, no crackles. Normal respiratory effort. No accessory muscle use.  Cardiovascular: Regular rate and rhythm, no murmurs / rubs / gallops. No extremity edema. 2+ pedal pulses. No carotid bruits.  Abdomen: no tenderness, no masses palpated. No hepatosplenomegaly. Bowel sounds positive.  Musculoskeletal: no clubbing / cyanosis. No joint deformity upper and lower extremities. Good ROM, no contractures. Normal muscle tone.  Skin: no rashes, lesions, ulcers. No induration Neurologic: CN 2-12 grossly intact.  Right upper and lower extremity strength 5/5.  Left upper and lower extremity strength is 1/5.  Left sided facial droop is noted. Psychiatric: Normal judgment and insight. Alert and oriented x 3. Normal mood.    Data Reviewed:   CBC: Recent Labs  Lab 09/20/18 1142 09/21/18 1120 09/22/18 0313 09/23/18 0419 09/24/18 0359 09/25/18 0458  WBC 14.0*  --  11.5* 10.9* 9.7 9.2  NEUTROABS 11.5*  --   --   --   --   --   HGB 15.5*  --  13.8 13.3 12.6 13.1  HCT 49.2* 44.1 43.7 42.0 40.1 40.6  MCV 84.7  --  86.4 85.9 86.4 84.1  PLT 207  --  175 181 188 662   Basic Metabolic Panel: Recent Labs  Lab 09/20/18 1142 09/22/18 0313 09/23/18 0419 09/24/18 0359 09/25/18 0458  NA 146* 141 143 143 141  K 3.8 4.0 4.5 4.7 4.3  CL 107 108 111 110 109  CO2 23 26 25 27  21*  GLUCOSE 132* 114* 104* 105* 118*  BUN 31* 12 13 8 8   CREATININE 1.04* 0.92 0.69 0.75 0.70  CALCIUM 10.4* 8.5* 8.5* 8.7* 9.0  MG 2.3 1.9 1.9 1.7 1.7  PHOS 2.7  --   --   --   --    GFR: Estimated Creatinine Clearance: 65.9 mL/min (by C-G formula  based on SCr of 0.7 mg/dL). Liver Function Tests: Recent Labs  Lab 09/20/18 1142  AST 63*  ALT 30  ALKPHOS 61  BILITOT 1.3*  PROT 8.5*  ALBUMIN 4.2   No results for input(s): LIPASE, AMYLASE in the last 168 hours. No results for input(s): AMMONIA in the last 168 hours. Coagulation Profile: Recent Labs  Lab 09/20/18 1142  INR 1.1   Cardiac Enzymes: Recent Labs  Lab 09/21/18 0525 09/22/18 0313 09/23/18 0419 09/24/18 0359 09/25/18 0458  CKTOTAL 2,883* 1,637* 789* 462* 358*   BNP (last 3 results) No results for input(s): PROBNP in the last 8760 hours. HbA1C: No results for input(s): HGBA1C in the last 72 hours. CBG: Recent Labs  Lab 09/24/18 0605 09/24/18 1219 09/24/18 1722 09/24/18 2106 09/25/18 0616  GLUCAP 105* 106* 110* 112* 123*   Lipid Profile: No results for input(s): CHOL, HDL, LDLCALC, TRIG, CHOLHDL, LDLDIRECT in the last 72 hours. Thyroid Function Tests: No results for input(s): TSH, T4TOTAL, FREET4, T3FREE, THYROIDAB in the last 72 hours. Anemia Panel: No results for input(s): VITAMINB12, FOLATE, FERRITIN, TIBC, IRON, RETICCTPCT in the last 72 hours. Sepsis Labs: No results for input(s): PROCALCITON, LATICACIDVEN in the last 168 hours.  Recent  Results (from the past 240 hour(s))  SARS Coronavirus 2 (CEPHEID - Performed in [facility] lab), Hosp Order     Status: None   Collection Time: 09/20/18 11:10 AM   Specimen: Nasopharyngeal Swab  Result Value Ref Range Status   SARS Coronavirus 2 NEGATIVE NEGATIVE Final    Comment: (NOTE) If result is NEGATIVE SARS-CoV-2 target nucleic acids are NOT DETECTED. The SARS-CoV-2 RNA is generally detectable in upper and lower  respiratory specimens during the acute phase of infection. The lowest  concentration of SARS-CoV-2 viral copies this assay can detect is 250  copies / mL. A negative result does not preclude SARS-CoV-2 infection  and should not be used as the sole basis for treatment or other   patient management decisions.  A negative result may occur with  improper specimen collection / handling, submission of specimen other  than nasopharyngeal swab, presence of viral mutation(s) within the  areas targeted by this assay, and inadequate number of viral copies  (<250 copies / mL). A negative result must be combined with clinical  observations, patient history, and epidemiological information. If result is POSITIVE SARS-CoV-2 target nucleic acids are DETECTED. The SARS-CoV-2 RNA is generally detectable in upper and lower  respiratory specimens dur ing the acute phase of infection.  Positive  results are indicative of active infection with SARS-CoV-2.  Clinical  correlation with patient history and other diagnostic information is  necessary to determine patient infection status.  Positive results do  not rule out bacterial infection or co-infection with other viruses. If result is PRESUMPTIVE POSTIVE SARS-CoV-2 nucleic acids MAY BE PRESENT.   A presumptive positive result was obtained on the submitted specimen  and confirmed on repeat testing.  While 2019 novel coronavirus  (SARS-CoV-2) nucleic acids may be present in the submitted sample  additional confirmatory testing may be necessary for epidemiological  and / or clinical management purposes  to differentiate between  SARS-CoV-2 and other Sarbecovirus currently known to infect humans.  If clinically indicated additional testing with an alternate test  methodology [phone] is advised. The SARS-CoV-2 RNA is generally  detectable in upper and lower respiratory sp ecimens during the acute  phase of infection. The expected result is Negative. Fact Sheet for Patients:  BoilerBrush.com.cyhttps://www.fda.gov/media/136312/download Fact Sheet for Healthcare Providers: https://pope.com/https://www.fda.gov/media/136313/download This test is not yet approved or cleared by the [place] FDA and has been authorized for detection and/or diagnosis of SARS-CoV-2 by  FDA under an Emergency Use Authorization (EUA).  This EUA will remain in effect (meaning this test can be used) for the duration of the COVID-19 declaration under Section 564(b)(1) of the Act, 21 U.S.C. section 360bbb-3(b)(1), unless the authorization is terminated or revoked sooner. Performed at [facility], [address] [place], [place] [address]   Urine culture     Status: None   Collection Time: 09/20/18 12:46 PM   Specimen: Urine, Clean Catch  Result Value Ref Range Status   Specimen Description   Final    URINE, CLEAN CATCH Performed at [facility], [address] [place], [place] [address]    Special Requests   Final    NONE Performed at [facility], [address] [place], [place] [address]    Culture   Final    Multiple bacterial morphotypes present, none predominant. Suggest appropriate recollection if clinically indicated.   Report Status 09/21/2018 FINAL  Final         Radiology Studies: No results found.      Scheduled  Meds: .  stroke: mapping our early stages of recovery book   Does not apply Once  . amiodarone  200 mg Oral BID  . apixaban  5 mg Oral BID  . atorvastatin  80 mg Oral q1800  . collagenase   Topical Daily  . digoxin  0.125 mg Oral Daily  . diltiazem  90 mg Oral QID  . insulin aspart  0-9 Units Subcutaneous TID WC   Continuous Infusions:    LOS: 5 days   Time spent= 25 mins    Deionte Spivack Joline Maxcyhirag Kenli Waldo, MD [facility]  If 7PM-7AM, please contact night-coverage [email or web address] 09/25/2018, 11:02 AM

## 2018-09-25 NOTE — Progress Notes (Signed)
Progress Note  Patient Name: Rachel PageBarbara C Jenison Date of Encounter: 09/25/2018  Primary Cardiologist: Dina RichBranch, Jonathan, MD   Subjective   Feeling well laying comfortably in bed no shortness of breath  Inpatient Medications    Scheduled Meds: .  stroke: mapping our early stages of recovery book   Does not apply Once  . amiodarone  200 mg Oral BID  . apixaban  5 mg Oral BID  . atorvastatin  80 mg Oral q1800  . collagenase   Topical Daily  . digoxin  0.125 mg Oral Daily  . diltiazem  90 mg Oral QID  . insulin aspart  0-9 Units Subcutaneous TID WC   Continuous Infusions: . 0.9 % NaCl with KCl 20 mEq / L 75 mL/hr at 09/24/18 2120  . magnesium sulfate bolus IVPB     PRN Meds: acetaminophen **OR** acetaminophen (TYLENOL) oral liquid 160 mg/5 mL **OR** acetaminophen, alum & mag hydroxide-simeth, guaiFENesin-dextromethorphan, hydrocortisone, hydrocortisone cream, ipratropium-albuterol, lip balm, loratadine, metoprolol tartrate, Muscle Rub, phenol, polyethylene glycol, polyvinyl alcohol, prochlorperazine, Resource ThickenUp Clear, senna-docusate, sodium chloride   Vital Signs    Vitals:   09/24/18 2010 09/25/18 0010 09/25/18 0410 09/25/18 0732  BP: 128/80 111/73 134/60 122/63  Pulse: (!) 101 97 99   Resp: 18 15 15    Temp: 98.3 F (36.8 C) 98.6 F (37 C) 98.1 F (36.7 C) 98 F (36.7 C)  TempSrc: Oral Oral Oral Oral  SpO2: 100% 99% 97% 100%  Weight:      Height:       No intake or output data in the 24 hours ending 09/25/18 0804 Last 3 Weights 09/20/2018 09/20/2018  Weight (lbs) 160 lb 7.9 oz 156 lb  Weight (kg) 72.8 kg 70.761 kg      Telemetry    Atrial fibrillation with improved heart rates, currently in the 90s- Personally Reviewed  ECG    A. fib with RVR previously- Personally Reviewed  Physical Exam   GEN: No acute distress.   Neck: No JVD Cardiac:  Irregularly irregular, no murmurs, rubs, or gallops.  Respiratory: Clear to auscultation bilaterally. GI: Soft,  nontender, non-distended  MS: No edema; No deformity. Neuro:   Minimal facial droop Psych: Normal affect   Labs    High Sensitivity Troponin:   Recent Labs  Lab 09/20/18 1142  TROPONINIHS 19.00*      Cardiac EnzymesNo results for input(s): TROPONINI in the last 168 hours. No results for input(s): TROPIPOC in the last 168 hours.   Chemistry Recent Labs  Lab 09/20/18 1142  09/23/18 0419 09/24/18 0359 09/25/18 0458  NA 146*   < > 143 143 141  K 3.8   < > 4.5 4.7 4.3  CL 107   < > 111 110 109  CO2 23   < > 25 27 21*  GLUCOSE 132*   < > 104* 105* 118*  BUN 31*   < > 13 8 8   CREATININE 1.04*   < > 0.69 0.75 0.70  CALCIUM 10.4*   < > 8.5* 8.7* 9.0  PROT 8.5*  --   --   --   --   ALBUMIN 4.2  --   --   --   --   AST 63*  --   --   --   --   ALT 30  --   --   --   --   ALKPHOS 61  --   --   --   --   BILITOT  1.3*  --   --   --   --   GFRNONAA 54*   < > >60 >60 >60  GFRAA >60   < > >60 >60 >60  ANIONGAP 16*   < > 7 6 11    < > = values in this interval not displayed.     Hematology Recent Labs  Lab 09/23/18 0419 09/24/18 0359 09/25/18 0458  WBC 10.9* 9.7 9.2  RBC 4.89 4.64 4.83  HGB 13.3 12.6 13.1  HCT 42.0 40.1 40.6  MCV 85.9 86.4 84.1  MCH 27.2 27.2 27.1  MCHC 31.7 31.4 32.3  RDW 13.5 13.7 13.4  PLT 181 188 196    BNP Recent Labs  Lab 09/20/18 1142  BNP 485.0*     DDimer No results for input(s): DDIMER in the last 168 hours.   Radiology    No results found.  Cardiac Studies   Echo EF 65%  Patient Profile     73 y.o. female with acute stroke A. fib RVR  Assessment & Plan    Atrial fibrillation with rapid ventricular response persistent - Now on Eliquis 5 mg twice a day.  Appreciate neurology's assistance.  No signs of hemorrhagic transformation. - Rate control is improved with increase of diltiazem from 60-90 4 times a day.  Continue. - Continue with digoxin as well as amiodarone.  Hyperlipidemia -Statin 80 mg LDL 121  Essential  hypertension -Currently well controlled.  We are not bottoming out any blood pressures in the setting of stroke.  Tolerating the diltiazem.  Demand ischemia -Mildly elevated troponin in the setting of tachycardia.  From cardiology standpoint okay for rehab.  Candee Furbish, MD      For questions or updates, please contact [facility] Please consult [email or web address] for contact info under        Signed, Candee Furbish, MD  09/25/2018, 8:04 AM

## 2018-09-26 LAB — BASIC METABOLIC PANEL
Anion gap: 10 (ref 5–15)
BUN: 9 mg/dL (ref 8–23)
CO2: 26 mmol/L (ref 22–32)
Calcium: 9.3 mg/dL (ref 8.9–10.3)
Chloride: 104 mmol/L (ref 98–111)
Creatinine, Ser: 0.88 mg/dL (ref 0.44–1.00)
GFR calc Af Amer: >60 mL/min (ref >60)
GFR calc non Af Amer: >60 mL/min (ref >60)
Glucose, Bld: 122 mg/dL — ABNORMAL HIGH (ref 70–99)
Potassium: 3.7 mmol/L (ref 3.5–5.1)
Sodium: 140 mmol/L (ref 135–145)

## 2018-09-26 LAB — CBC
HCT: 42 % (ref 36.0–46.0)
Hemoglobin: 13.6 g/dL (ref 12.0–15.0)
MCH: 27.6 pg (ref 26.0–34.0)
MCHC: 32.4 g/dL (ref 30.0–36.0)
MCV: 85.2 fL (ref 80.0–100.0)
Platelets: 227 10*3/uL (ref 150–400)
RBC: 4.93 MIL/uL (ref 3.87–5.11)
RDW: 13.6 % (ref 11.5–15.5)
WBC: 9.6 10*3/uL (ref 4.0–10.5)
nRBC: 0 % (ref 0.0–0.2)

## 2018-09-26 LAB — GLUCOSE, CAPILLARY
Glucose-Capillary: 118 mg/dL — ABNORMAL HIGH (ref 70–99)
Glucose-Capillary: 111 mg/dL — ABNORMAL HIGH (ref 70–99)
Glucose-Capillary: 134 mg/dL — ABNORMAL HIGH (ref 70–99)

## 2018-09-26 LAB — MAGNESIUM: Magnesium: 2 mg/dL (ref 1.7–2.4)

## 2018-09-26 MED ORDER — POTASSIUM CHLORIDE 10 MEQ/100ML IV SOLN
10.0000 meq | INTRAVENOUS | Status: AC
  Administered 2018-09-26 (×3): 10 meq via INTRAVENOUS
  Filled 2018-09-26 (×3): qty 100

## 2018-09-26 NOTE — Progress Notes (Signed)
PROGRESS NOTE    Rachel PageBarbara C Fox  ZOX:[id number] DOB: 1946-02-21 DOA: 09/20/2018 PCP: Benita StabileHall, John Z, MD   Brief Narrative:  73 year old with history of atrial fibrillation, diabetes mellitus type 2, essential hypertension who was brought to the hospital for evaluation of fall.  She was found to be in mild rhabdomyolysis, atrial fibrillation with RVR and also a large right-sided CVA on the CT of the head.  Neurology consulted who recommended starting Eliquis 3 to 5 days after stroke.  Patient was also seen by cardiology due to uncontrolled atrial fibrillation with RVR, digoxin and amiodarone were added.  Physical therapy recommended SNF/CIR. rehab team has been consulted.   Assessment & Plan:   Principal Problem:   Cerebrovascular accident (CVA) with involvement of left side of body (HCC) Active Problems:   Atrial fibrillation with RVR (HCC)   Hypertension   Rhabdomyolysis   Type 2 diabetes mellitus (HCC)   Hyperlipidemia   Hyperbilirubinemia   Leukocytosis   Hypernatremia   Pressure injury of skin  Moderate to large right-sided acute MCA stroke, embolic, stable -MRI brain without contrast and MRA head and neck without contrast (given deranged renal function) -Maintain Euthermia.  -ASA ordered.  -Echocardiogram-ejection fraction 65% in atrial fibrillation -LDL 121, Lipitor 80 mg daily.  Hemoglobin A1c 6.1. -Frequent neuro checks -Atorvastatin. -Risk factor modification -Neurology team following- appreciate input.  -PT/OT eval, Speech consult-appreciate input -Eliquis started.   Atrial fibrillation with RVR, improved. - Eliquis resumed. - Cardiology team is following.  Continue digoxin and amiodarone.  Heart rate appears to be better therefore we will continue Cardizem 90 mg every 6 hours.  Monitor for blood pressure.  Mild rhabdomyolysis -Gentle hydration.  Hypernatremia, resolved -Likely from dehydration.  Continue to follow sodium levels.  Diabetes mellitus type 2  Hypoglycemia secondary to poor oral intake, stable now - Metformin on hold.  Accu-Chek sliding scale  Essential hypertension -Micardis and amlodipine on hold due to permissive hypertension.  This will also allow to adjust Cardizem for rate control.   DVT prophylaxis: SCDs Code Status: Full code none Family Communication: None at bedside Disposition Plan: CIR placement, likely tomorrow  Consultants:   Cardiology  Neurology  Procedures:   None  Antimicrobials:   None   Subjective: Feeling better, no complaints.  Heart rate appears to be better on telemetry.  Review of Systems Otherwise negative except as per HPI, including: General = no fevers, chills, dizziness, malaise, fatigue HEENT/EYES = negative for pain, redness, loss of vision, double vision, blurred vision, loss of hearing, sore throat, hoarseness, dysphagia Cardiovascular= negative for chest pain, palpitation, murmurs, lower extremity swelling Respiratory/lungs= negative for shortness of breath, cough, hemoptysis, wheezing, mucus production Gastrointestinal= negative for nausea, vomiting,, abdominal pain, melena, hematemesis Genitourinary= negative for Dysuria, Hematuria, Change in Urinary Frequency MSK = Negative for arthralgia, myalgias, Back Pain, Joint swelling  Neurology= Negative for headache, seizures, numbness, tingling  Psychiatry= Negative for anxiety, depression, suicidal and homocidal ideation Allergy/Immunology= Medication/Food allergy as listed  Skin= Negative for Rash, lesions, ulcers, itching    Objective: Vitals:   09/26/18 0405 09/26/18 0717 09/26/18 0912 09/26/18 1116  BP: 119/64  126/88 125/67  Pulse: 78     Resp:      Temp: 97.9 F (36.6 C) 98 F (36.7 C)  98.3 F (36.8 C)  TempSrc: Oral Oral  Oral  SpO2: 99% 100%  100%  Weight:      Height:        Intake/Output Summary (Last 24 hours) at  09/26/2018 1151 Last data filed at 09/25/2018 2054 Gross per 24 hour  Intake 180 ml   Output 600 ml  Net -420 ml   Filed Weights   09/20/18 1100 09/20/18 1942  Weight: 70.8 kg 72.8 kg    Examination: Constitutional: NAD, calm, comfortable Eyes: PERRL, lids and conjunctivae normal ENMT: Mucous membranes are moist. Posterior pharynx clear of any exudate or lesions.Normal dentition.  Neck: normal, supple, no masses, no thyromegaly Respiratory: clear to auscultation bilaterally, no wheezing, no crackles. Normal respiratory effort. No accessory muscle use.  Cardiovascular: Regular rate and rhythm, no murmurs / rubs / gallops. No extremity edema. 2+ pedal pulses. No carotid bruits.  Abdomen: no tenderness, no masses palpated. No hepatosplenomegaly. Bowel sounds positive.  Musculoskeletal: no clubbing / cyanosis. No joint deformity upper and lower extremities. Good ROM, no contractures. Normal muscle tone.  Skin: no rashes, lesions, ulcers. No induration Neurologic: CN 2-12 grossly intact. Sensation intact, DTR normal.  Right upper and lower extremity strength noted to be 4/5, left upper and lower extremity strength noted to be 1/5.  Left-sided facial asymmetry noted. Psychiatric: Normal judgment and insight. Alert and oriented x 3. Normal mood.    Data Reviewed:   CBC: Recent Labs  Lab 09/20/18 1142  09/22/18 0313 09/23/18 0419 09/24/18 0359 09/25/18 0458 09/26/18 0512  WBC 14.0*  --  11.5* 10.9* 9.7 9.2 9.6  NEUTROABS 11.5*  --   --   --   --   --   --   HGB 15.5*  --  13.8 13.3 12.6 13.1 13.6  HCT 49.2*   < > 43.7 42.0 40.1 40.6 42.0  MCV 84.7  --  86.4 85.9 86.4 84.1 85.2  PLT 207  --  175 181 188 196 227   < > = values in this interval not displayed.   Basic Metabolic Panel: Recent Labs  Lab 09/20/18 1142 09/22/18 0313 09/23/18 0419 09/24/18 0359 09/25/18 0458 09/26/18 0512  NA 146* 141 143 143 141 140  K 3.8 4.0 4.5 4.7 4.3 3.7  CL 107 108 111 110 109 104  CO2 23 26 25 27  21* 26  GLUCOSE 132* 114* 104* 105* 118* 122*  BUN 31* 12 13 8 8 9    CREATININE 1.04* 0.92 0.69 0.75 0.70 0.88  CALCIUM 10.4* 8.5* 8.5* 8.7* 9.0 9.3  MG 2.3 1.9 1.9 1.7 1.7 2.0  PHOS 2.7  --   --   --   --   --    GFR: Estimated Creatinine Clearance: 59.9 mL/min (by C-G formula based on SCr of 0.88 mg/dL). Liver Function Tests: Recent Labs  Lab 09/20/18 1142  AST 63*  ALT 30  ALKPHOS 61  BILITOT 1.3*  PROT 8.5*  ALBUMIN 4.2   No results for input(s): LIPASE, AMYLASE in the last 168 hours. No results for input(s): AMMONIA in the last 168 hours. Coagulation Profile: Recent Labs  Lab 09/20/18 1142  INR 1.1   Cardiac Enzymes: Recent Labs  Lab 09/21/18 0525 09/22/18 0313 09/23/18 0419 09/24/18 0359 09/25/18 0458  CKTOTAL 2,883* 1,637* 789* 462* 358*   BNP (last 3 results) No results for input(s): PROBNP in the last 8760 hours. HbA1C: No results for input(s): HGBA1C in the last 72 hours. CBG: Recent Labs  Lab 09/25/18 0616 09/25/18 1228 09/25/18 1639 09/25/18 2152 09/26/18 0608  GLUCAP 123* 96 105* 120* 118*   Lipid Profile: No results for input(s): CHOL, HDL, LDLCALC, TRIG, CHOLHDL, LDLDIRECT in the last 72 hours. Thyroid Function Tests:  No results for input(s): TSH, T4TOTAL, FREET4, T3FREE, THYROIDAB in the last 72 hours. Anemia Panel: No results for input(s): VITAMINB12, FOLATE, FERRITIN, TIBC, IRON, RETICCTPCT in the last 72 hours. Sepsis Labs: No results for input(s): PROCALCITON, LATICACIDVEN in the last 168 hours.  Recent Results (from the past 240 hour(s))  SARS Coronavirus 2 (CEPHEID - Performed in [place] hospital lab), Hosp Order     Status: None   Collection Time: 09/20/18 11:10 AM   Specimen: Nasopharyngeal Swab  Result Value Ref Range Status   SARS Coronavirus 2 NEGATIVE NEGATIVE Final    Comment: (NOTE) If result is NEGATIVE SARS-CoV-2 target nucleic acids are NOT DETECTED. The SARS-CoV-2 RNA is generally detectable in upper and lower  respiratory specimens during the acute phase of infection. The  lowest  concentration of SARS-CoV-2 viral copies this assay can detect is 250  copies / mL. A negative result does not preclude SARS-CoV-2 infection  and should not be used as the sole basis for treatment or other  patient management decisions.  A negative result may occur with  improper specimen collection / handling, submission of specimen other  than nasopharyngeal swab, presence of viral mutation(s) within the  areas targeted by this assay, and inadequate number of viral copies  (<250 copies / mL). A negative result must be combined with clinical  observations, patient history, and epidemiological information. If result is POSITIVE SARS-CoV-2 target nucleic acids are DETECTED. The SARS-CoV-2 RNA is generally detectable in upper and lower  respiratory specimens dur ing the acute phase of infection.  Positive  results are indicative of active infection with SARS-CoV-2.  Clinical  correlation with patient history and other diagnostic information is  necessary to determine patient infection status.  Positive results do  not rule out bacterial infection or co-infection with other viruses. If result is PRESUMPTIVE POSTIVE SARS-CoV-2 nucleic acids MAY BE PRESENT.   A presumptive positive result was obtained on the submitted specimen  and confirmed on repeat testing.  While 2019 novel coronavirus  (SARS-CoV-2) nucleic acids may be present in the submitted sample  additional confirmatory testing may be necessary for epidemiological  and / or clinical management purposes  to differentiate between  SARS-CoV-2 and other Sarbecovirus currently known to infect humans.  If clinically indicated additional testing with an alternate test  methodology [phone]) is advised. The SARS-CoV-2 RNA is generally  detectable in upper and lower respiratory sp ecimens during the acute  phase of infection. The expected result is Negative. Fact Sheet for Patients:  StrictlyIdeas.no  Fact Sheet for Healthcare Providers: BankingDealers.co.za This test is not yet approved or cleared by the [place] FDA and has been authorized for detection and/or diagnosis of SARS-CoV-2 by FDA under an Emergency Use Authorization (EUA).  This EUA will remain in effect (meaning this test can be used) for the duration of the COVID-19 declaration under Section 564(b)(1) of the Act, 21 U.S.C. section 360bbb-3(b)(1), unless the authorization is terminated or revoked sooner. Performed at [facility], [address], [place], [place] [address]   Urine culture     Status: None   Collection Time: 09/20/18 12:46 PM   Specimen: Urine, Clean Catch  Result Value Ref Range Status   Specimen Description   Final    URINE, CLEAN CATCH Performed at [facility], [address] [place], [place] [address]    Special Requests   Final    NONE Performed at [facility], [address], [place], [place] [address]  Culture   Final    Multiple bacterial morphotypes present, none predominant. Suggest appropriate recollection if clinically indicated.   Report Status 09/21/2018 FINAL  Final         Radiology Studies: No results found.      Scheduled Meds: .  stroke: mapping our early stages of recovery book   Does not apply Once  . amiodarone  200 mg Oral BID  . apixaban  5 mg Oral BID  . atorvastatin  80 mg Oral q1800  . collagenase   Topical Daily  . digoxin  0.125 mg Oral Daily  . diltiazem  90 mg Oral QID  . insulin aspart  0-9 Units Subcutaneous TID WC   Continuous Infusions:    LOS: 6 days   Time spent= 25 mins    Fallou Hulbert Joline Maxcyhirag Dylann Gallier, MD [facility]  If 7PM-7AM, please contact night-coverage [email or web address] 09/26/2018, 11:51 AM

## 2018-09-26 NOTE — Progress Notes (Signed)
Progress Note  Patient Name: Rachel PageBarbara C Fox Date of Encounter: 09/26/2018  Primary Cardiologist: Dina RichBranch, Jonathan, MD   Subjective   Feeling well laying comfortably in bed no shortness of breath. Doing well.   Inpatient Medications    Scheduled Meds: .  stroke: mapping our early stages of recovery book   Does not apply Once  . amiodarone  200 mg Oral BID  . apixaban  5 mg Oral BID  . atorvastatin  80 mg Oral q1800  . collagenase   Topical Daily  . digoxin  0.125 mg Oral Daily  . diltiazem  90 mg Oral QID  . insulin aspart  0-9 Units Subcutaneous TID WC   Continuous Infusions: . potassium chloride 10 mEq (09/26/18 0909)   PRN Meds: acetaminophen **OR** acetaminophen (TYLENOL) oral liquid 160 mg/5 mL **OR** acetaminophen, alum & mag hydroxide-simeth, guaiFENesin-dextromethorphan, hydrocortisone, hydrocortisone cream, ipratropium-albuterol, lip balm, loratadine, metoprolol tartrate, Muscle Rub, phenol, polyethylene glycol, polyvinyl alcohol, prochlorperazine, Resource ThickenUp Clear, senna-docusate, sodium chloride   Vital Signs    Vitals:   09/25/18 2347 09/26/18 0405 09/26/18 0717 09/26/18 0912  BP: 137/67 119/64  126/88  Pulse:  78    Resp: (!) 22     Temp: 98.1 F (36.7 C) 97.9 F (36.6 C) 98 F (36.7 C)   TempSrc: Oral Oral Oral   SpO2: 100% 99% 100%   Weight:      Height:        Intake/Output Summary (Last 24 hours) at 09/26/2018 1004 Last data filed at 09/25/2018 2054 Gross per 24 hour  Intake 180 ml  Output 600 ml  Net -420 ml   Last 3 Weights 09/20/2018 09/20/2018  Weight (lbs) 160 lb 7.9 oz 156 lb  Weight (kg) 72.8 kg 70.761 kg      Telemetry    Atrial fibrillation with improved heart rates, currently in the 90s- Personally Reviewed  ECG    A. fib with RVR previously- Personally Reviewed  Physical Exam   GEN: Well nourished, well developed, in no acute distress  HEENT: normal  Neck: no JVD, carotid bruits, or masses Cardiac: irreg; no  murmurs, rubs, or gallops,no edema  Respiratory:  clear to auscultation bilaterally, normal work of breathing GI: soft, nontender, nondistended, + BS MS: no deformity or atrophy  Skin: warm and dry, no rash Neuro:  Alert and Oriented x 3, Left sided weakness noted.  Psych: euthymic mood, full affect   Labs    High Sensitivity Troponin:   Recent Labs  Lab 09/20/18 1142  TROPONINIHS 19.00*      Cardiac EnzymesNo results for input(s): TROPONINI in the last 168 hours. No results for input(s): TROPIPOC in the last 168 hours.   Chemistry Recent Labs  Lab 09/20/18 1142  09/24/18 0359 09/25/18 0458 09/26/18 0512  NA 146*   < > 143 141 140  K 3.8   < > 4.7 4.3 3.7  CL 107   < > 110 109 104  CO2 23   < > 27 21* 26  GLUCOSE 132*   < > 105* 118* 122*  BUN 31*   < > 8 8 9   CREATININE 1.04*   < > 0.75 0.70 0.88  CALCIUM 10.4*   < > 8.7* 9.0 9.3  PROT 8.5*  --   --   --   --   ALBUMIN 4.2  --   --   --   --   AST 63*  --   --   --   --  ALT 30  --   --   --   --   ALKPHOS 61  --   --   --   --   BILITOT 1.3*  --   --   --   --   GFRNONAA 54*   < > >60 >60 >60  GFRAA >60   < > >60 >60 >60  ANIONGAP 16*   < > 6 11 10    < > = values in this interval not displayed.     Hematology Recent Labs  Lab 09/24/18 0359 09/25/18 0458 09/26/18 0512  WBC 9.7 9.2 9.6  RBC 4.64 4.83 4.93  HGB 12.6 13.1 13.6  HCT 40.1 40.6 42.0  MCV 86.4 84.1 85.2  MCH 27.2 27.1 27.6  MCHC 31.4 32.3 32.4  RDW 13.7 13.4 13.6  PLT 188 196 227    BNP Recent Labs  Lab 09/20/18 1142  BNP 485.0*     DDimer No results for input(s): DDIMER in the last 168 hours.   Radiology    No results found.  Cardiac Studies   Echo EF 65%  Patient Profile     73 y.o. female with acute stroke A. fib RVR  Assessment & Plan    Atrial fibrillation with rapid ventricular response persistent - Now on Eliquis 5 mg twice a day.  Appreciate neurology's assistance.  No signs of hemorrhagic transformation. -  Rate control is improved with increase of diltiazem from 60-90 4 times a day.  Continue.  Continued improvement - Continue with digoxin as well as amiodarone.  - no changes  Hyperlipidemia -Statin 80 mg LDL 121, stable  Essential hypertension -Currently well controlled.  We are not bottoming out any blood pressures in the setting of stroke.  Tolerating the diltiazem. No issues  Demand ischemia -Mildly elevated troponin in the setting of tachycardia. Unchanged  From cardiology standpoint okay for rehab tomorrow  Candee Furbish, MD      For questions or updates, please contact [place] Please consult [email or web address] for contact info under        Signed, Candee Furbish, MD  09/26/2018, 10:04 AM

## 2018-09-26 NOTE — Progress Notes (Signed)
Occupational Therapy Treatment Patient Details Name: Rachel Fox MRN: [id number] DOB: November 13, 1945 Today's Date: 09/26/2018    History of present illness Pt is a 73 y/o female with a PMH significant for HTN, DM. She presents after being found on the floor after ~2 days. CT revealed moderate to large R-sided subacute MCA infarct. Pt was also found to be in a-fib with RVR and with mild rhabdomyolitis.    OT comments  Pt. Seen for skilled OT session.  Focus of session was HEP with focus of self ROM to LUE.  Educated on positioning of LUE for edema management also.  Pt. Very motivated and eager to complete ROM/exercises reviewed with her.    Follow Up Recommendations  CIR;Supervision/Assistance - 24 hour    Equipment Recommendations  None recommended by OT    Recommendations for Other Services      Precautions / Restrictions Precautions Precautions: Fall Precaution Comments: L side weak/flaccid, right gaze preference, L shoulder pain (risk for subluxation), pressure injury from laying on floor PTA       Mobility Bed Mobility                  Transfers                      Balance                                           ADL either performed or assessed with clinical judgement   ADL                                               Vision       Perception     Praxis      Cognition Arousal/Alertness: Awake/alert Behavior During Therapy: [facility] for tasks assessed/performed                                            Exercises General Exercises - Upper Extremity Shoulder Flexion: Self ROM;Left;10 reps;Supine(able to hold L wrist with R hand and guide b ues for rom) Shoulder Extension: Self ROM;Left;10 reps;Supine Elbow Flexion: Self ROM;Left;10 reps;Supine Elbow Extension: Self ROM;Left;10 reps;Supine Wrist Extension: Self ROM;Left;10 reps;Supine Digit Composite Flexion: Self ROM;Left;10  reps;Supine Other Exercises Other Exercises: educated on and demonstrated postioning and protection of LUE for edema management.  pt. able to utilize lotion to ease massage and gentle rom to all digits on L hand.   Shoulder Instructions       General Comments      Pertinent Vitals/ Pain          Home Living                                          Prior Functioning/Environment              Frequency  Min 2X/week        Progress Toward Goals  OT Goals(current goals can now be found in the care plan section)  Progress towards OT goals:  Progressing toward goals     Plan      Co-evaluation                 AM-PAC OT "6 Clicks" Daily Activity     Outcome Measure   Help from another person eating meals?: A Lot Help from another person taking care of personal grooming?: A Lot Help from another person toileting, which includes using toliet, bedpan, or urinal?: Total Help from another person bathing (including washing, rinsing, drying)?: A Lot Help from another person to put on and taking off regular upper body clothing?: A Lot Help from another person to put on and taking off regular lower body clothing?: Total 6 Click Score: 10    End of Session    OT Visit Diagnosis: Muscle weakness (generalized) (M62.81);Hemiplegia and hemiparesis Hemiplegia - Right/Left: Left Hemiplegia - dominant/non-dominant: Non-Dominant Hemiplegia - caused by: Cerebral infarction   Activity Tolerance Patient tolerated treatment well   Patient Left in bed;with call bell/phone within reach;with bed alarm set   Nurse Communication          Time: 0981-1914 OT Time Calculation (min): 18 min  Charges: OT General Charges $OT Visit: 1 Visit OT Treatments $Therapeutic Exercise: 8-22 mins  Janice Coffin, COTA/L 09/26/2018, 10:36 AM

## 2018-09-27 ENCOUNTER — Encounter (HOSPITAL_COMMUNITY): Payer: Self-pay

## 2018-09-27 ENCOUNTER — Inpatient Hospital Stay (HOSPITAL_COMMUNITY)
Admission: RE | Admit: 2018-09-27 | Discharge: 2018-11-01 | DRG: 057 | Disposition: A | Discharge status: Skilled Nursing Facility | Payer: Medicare Other | Source: Intra-hospital | Attending: Physical Medicine & Rehabilitation | Admitting: Physical Medicine & Rehabilitation

## 2018-09-27 ENCOUNTER — Other Ambulatory Visit: Payer: Self-pay

## 2018-09-27 DIAGNOSIS — E1169 Type 2 diabetes mellitus with other specified complication: Secondary | ICD-10-CM

## 2018-09-27 DIAGNOSIS — E669 Obesity, unspecified: Secondary | ICD-10-CM | POA: Diagnosis present

## 2018-09-27 DIAGNOSIS — I69322 Dysarthria following cerebral infarction: Secondary | ICD-10-CM

## 2018-09-27 DIAGNOSIS — L89322 Pressure ulcer of left buttock, stage 2: Secondary | ICD-10-CM | POA: Diagnosis present

## 2018-09-27 DIAGNOSIS — R350 Frequency of micturition: Secondary | ICD-10-CM | POA: Diagnosis present

## 2018-09-27 DIAGNOSIS — I482 Chronic atrial fibrillation, unspecified: Secondary | ICD-10-CM | POA: Diagnosis not present

## 2018-09-27 DIAGNOSIS — G8114 Spastic hemiplegia affecting left nondominant side: Secondary | ICD-10-CM | POA: Diagnosis not present

## 2018-09-27 DIAGNOSIS — I69354 Hemiplegia and hemiparesis following cerebral infarction affecting left non-dominant side: Principal | ICD-10-CM

## 2018-09-27 DIAGNOSIS — G8194 Hemiplegia, unspecified affecting left nondominant side: Secondary | ICD-10-CM

## 2018-09-27 DIAGNOSIS — R7309 Other abnormal glucose: Secondary | ICD-10-CM | POA: Diagnosis not present

## 2018-09-27 DIAGNOSIS — R1310 Dysphagia, unspecified: Secondary | ICD-10-CM | POA: Diagnosis present

## 2018-09-27 DIAGNOSIS — G8110 Spastic hemiplegia affecting unspecified side: Secondary | ICD-10-CM | POA: Diagnosis not present

## 2018-09-27 DIAGNOSIS — Z823 Family history of stroke: Secondary | ICD-10-CM | POA: Diagnosis not present

## 2018-09-27 DIAGNOSIS — Z111 Encounter for screening for respiratory tuberculosis: Secondary | ICD-10-CM | POA: Diagnosis not present

## 2018-09-27 DIAGNOSIS — I10 Essential (primary) hypertension: Secondary | ICD-10-CM | POA: Diagnosis present

## 2018-09-27 DIAGNOSIS — F29 Unspecified psychosis not due to a substance or known physiological condition: Secondary | ICD-10-CM | POA: Diagnosis not present

## 2018-09-27 DIAGNOSIS — M1712 Unilateral primary osteoarthritis, left knee: Secondary | ICD-10-CM | POA: Diagnosis not present

## 2018-09-27 DIAGNOSIS — M25562 Pain in left knee: Secondary | ICD-10-CM

## 2018-09-27 DIAGNOSIS — E8809 Other disorders of plasma-protein metabolism, not elsewhere classified: Secondary | ICD-10-CM | POA: Diagnosis not present

## 2018-09-27 DIAGNOSIS — L89324 Pressure ulcer of left buttock, stage 4: Secondary | ICD-10-CM | POA: Diagnosis not present

## 2018-09-27 DIAGNOSIS — Z79899 Other long term (current) drug therapy: Secondary | ICD-10-CM | POA: Diagnosis not present

## 2018-09-27 DIAGNOSIS — M25512 Pain in left shoulder: Secondary | ICD-10-CM

## 2018-09-27 DIAGNOSIS — F419 Anxiety disorder, unspecified: Secondary | ICD-10-CM | POA: Diagnosis not present

## 2018-09-27 DIAGNOSIS — Z8249 Family history of ischemic heart disease and other diseases of the circulatory system: Secondary | ICD-10-CM

## 2018-09-27 DIAGNOSIS — I4891 Unspecified atrial fibrillation: Secondary | ICD-10-CM | POA: Diagnosis present

## 2018-09-27 DIAGNOSIS — I69398 Other sequelae of cerebral infarction: Secondary | ICD-10-CM | POA: Diagnosis not present

## 2018-09-27 DIAGNOSIS — N179 Acute kidney failure, unspecified: Secondary | ICD-10-CM | POA: Diagnosis not present

## 2018-09-27 DIAGNOSIS — E119 Type 2 diabetes mellitus without complications: Secondary | ICD-10-CM | POA: Diagnosis present

## 2018-09-27 DIAGNOSIS — E1165 Type 2 diabetes mellitus with hyperglycemia: Secondary | ICD-10-CM | POA: Diagnosis not present

## 2018-09-27 DIAGNOSIS — M11262 Other chondrocalcinosis, left knee: Secondary | ICD-10-CM | POA: Diagnosis not present

## 2018-09-27 DIAGNOSIS — E7800 Pure hypercholesterolemia, unspecified: Secondary | ICD-10-CM | POA: Diagnosis present

## 2018-09-27 DIAGNOSIS — R682 Dry mouth, unspecified: Secondary | ICD-10-CM | POA: Diagnosis not present

## 2018-09-27 DIAGNOSIS — I69312 Visuospatial deficit and spatial neglect following cerebral infarction: Secondary | ICD-10-CM | POA: Diagnosis not present

## 2018-09-27 DIAGNOSIS — N3281 Overactive bladder: Secondary | ICD-10-CM | POA: Diagnosis not present

## 2018-09-27 DIAGNOSIS — Z90710 Acquired absence of both cervix and uterus: Secondary | ICD-10-CM | POA: Diagnosis not present

## 2018-09-27 DIAGNOSIS — R5381 Other malaise: Secondary | ICD-10-CM | POA: Diagnosis not present

## 2018-09-27 DIAGNOSIS — Z20828 Contact with and (suspected) exposure to other viral communicable diseases: Secondary | ICD-10-CM | POA: Diagnosis present

## 2018-09-27 DIAGNOSIS — Z7401 Bed confinement status: Secondary | ICD-10-CM | POA: Diagnosis not present

## 2018-09-27 DIAGNOSIS — L89153 Pressure ulcer of sacral region, stage 3: Secondary | ICD-10-CM | POA: Diagnosis not present

## 2018-09-27 DIAGNOSIS — Z7984 Long term (current) use of oral hypoglycemic drugs: Secondary | ICD-10-CM

## 2018-09-27 DIAGNOSIS — K5900 Constipation, unspecified: Secondary | ICD-10-CM | POA: Diagnosis present

## 2018-09-27 DIAGNOSIS — M6282 Rhabdomyolysis: Secondary | ICD-10-CM | POA: Diagnosis present

## 2018-09-27 DIAGNOSIS — M6281 Muscle weakness (generalized): Secondary | ICD-10-CM | POA: Diagnosis not present

## 2018-09-27 DIAGNOSIS — E785 Hyperlipidemia, unspecified: Secondary | ICD-10-CM | POA: Diagnosis present

## 2018-09-27 DIAGNOSIS — Z87891 Personal history of nicotine dependence: Secondary | ICD-10-CM | POA: Diagnosis not present

## 2018-09-27 DIAGNOSIS — F329 Major depressive disorder, single episode, unspecified: Secondary | ICD-10-CM | POA: Diagnosis not present

## 2018-09-27 DIAGNOSIS — I69391 Dysphagia following cerebral infarction: Secondary | ICD-10-CM | POA: Diagnosis not present

## 2018-09-27 DIAGNOSIS — I63511 Cerebral infarction due to unspecified occlusion or stenosis of right middle cerebral artery: Secondary | ICD-10-CM | POA: Diagnosis present

## 2018-09-27 DIAGNOSIS — R209 Unspecified disturbances of skin sensation: Secondary | ICD-10-CM | POA: Diagnosis not present

## 2018-09-27 DIAGNOSIS — Z6828 Body mass index (BMI) 28.0-28.9, adult: Secondary | ICD-10-CM

## 2018-09-27 DIAGNOSIS — R414 Neurologic neglect syndrome: Secondary | ICD-10-CM | POA: Diagnosis present

## 2018-09-27 DIAGNOSIS — R269 Unspecified abnormalities of gait and mobility: Secondary | ICD-10-CM | POA: Diagnosis not present

## 2018-09-27 DIAGNOSIS — I639 Cerebral infarction, unspecified: Secondary | ICD-10-CM | POA: Diagnosis not present

## 2018-09-27 DIAGNOSIS — R3915 Urgency of urination: Secondary | ICD-10-CM | POA: Diagnosis not present

## 2018-09-27 DIAGNOSIS — R51 Headache: Secondary | ICD-10-CM | POA: Diagnosis not present

## 2018-09-27 DIAGNOSIS — I4819 Other persistent atrial fibrillation: Secondary | ICD-10-CM | POA: Diagnosis not present

## 2018-09-27 DIAGNOSIS — R52 Pain, unspecified: Secondary | ICD-10-CM | POA: Diagnosis not present

## 2018-09-27 DIAGNOSIS — T796XXS Traumatic ischemia of muscle, sequela: Secondary | ICD-10-CM

## 2018-09-27 DIAGNOSIS — E559 Vitamin D deficiency, unspecified: Secondary | ICD-10-CM | POA: Diagnosis not present

## 2018-09-27 DIAGNOSIS — M2550 Pain in unspecified joint: Secondary | ICD-10-CM | POA: Diagnosis not present

## 2018-09-27 LAB — GLUCOSE, CAPILLARY
Glucose-Capillary: 128 mg/dL — ABNORMAL HIGH (ref 70–99)
Glucose-Capillary: 114 mg/dL — ABNORMAL HIGH (ref 70–99)
Glucose-Capillary: 148 mg/dL — ABNORMAL HIGH (ref 70–99)
Glucose-Capillary: 162 mg/dL — ABNORMAL HIGH (ref 70–99)

## 2018-09-27 LAB — CREATININE, SERUM
Creatinine, Ser: 0.86 mg/dL (ref 0.44–1.00)
GFR calc Af Amer: >60 mL/min (ref >60)
GFR calc non Af Amer: >60 mL/min (ref >60)

## 2018-09-27 MED ORDER — DILTIAZEM HCL 90 MG PO TABS: 90.0000 mg | ORAL_TABLET | Freq: Four times a day (QID) | ORAL | 0 refills | Status: DC

## 2018-09-27 MED ORDER — ACETAMINOPHEN 160 MG/5ML PO SOLN: 650.0000 mg | ORAL | Status: DC | PRN

## 2018-09-27 MED ORDER — IPRATROPIUM-ALBUTEROL 0.5-2.5 (3) MG/3ML IN SOLN: 3.0000 mL | RESPIRATORY_TRACT | Status: DC | PRN

## 2018-09-27 MED ORDER — POLYETHYLENE GLYCOL 3350 17 G PO PACK
17.0000 g | PACK | Freq: Every day | ORAL | Status: DC | PRN
  Administered 2018-10-25: 17 g via ORAL
  Filled 2018-09-27 (×2): qty 1

## 2018-09-27 MED ORDER — DIGOXIN 125 MCG PO TABS: 0.1250 mg | ORAL_TABLET | Freq: Every day | ORAL | 1 refills | Status: DC

## 2018-09-27 MED ORDER — COLLAGENASE 250 UNIT/GM EX OINT
TOPICAL_OINTMENT | Freq: Every day | CUTANEOUS | Status: DC
  Administered 2018-09-28 – 2018-10-08 (×11): via TOPICAL
  Filled 2018-09-27 (×3): qty 30

## 2018-09-27 MED ORDER — ACETAMINOPHEN 325 MG PO TABS
650.0000 mg | ORAL_TABLET | ORAL | Status: DC | PRN
  Administered 2018-09-27 – 2018-10-30 (×25): 650 mg via ORAL
  Filled 2018-09-27 (×29): qty 2

## 2018-09-27 MED ORDER — AMIODARONE HCL 200 MG PO TABS: 200.0000 mg | ORAL_TABLET | Freq: Two times a day (BID) | ORAL | 0 refills | Status: DC

## 2018-09-27 MED ORDER — APIXABAN 5 MG PO TABS: 5.0000 mg | ORAL_TABLET | Freq: Two times a day (BID) | ORAL | 1 refills | Status: DC

## 2018-09-27 MED ORDER — MUSCLE RUB 10-15 % EX CREA
1.0000 "application " | TOPICAL_CREAM | Freq: Two times a day (BID) | CUTANEOUS | Status: DC
  Administered 2018-09-27 – 2018-10-15 (×37): 1 via TOPICAL
  Filled 2018-09-27: qty 85

## 2018-09-27 MED ORDER — ATORVASTATIN CALCIUM 80 MG PO TABS: 80.0000 mg | ORAL_TABLET | Freq: Every day | ORAL | 1 refills | Status: DC

## 2018-09-27 MED ORDER — SALINE SPRAY 0.65 % NA SOLN: 1.0000 | NASAL | Status: DC | PRN

## 2018-09-27 MED ORDER — SENNOSIDES-DOCUSATE SODIUM 8.6-50 MG PO TABS
2.0000 | ORAL_TABLET | Freq: Every evening | ORAL | Status: DC | PRN
  Administered 2018-10-01 – 2018-10-10 (×2): 2 via ORAL
  Filled 2018-09-27 (×2): qty 2

## 2018-09-27 MED ORDER — HYDROCORTISONE 1 % EX CREA: 1.0000 "application " | TOPICAL_CREAM | Freq: Three times a day (TID) | CUTANEOUS | Status: DC | PRN

## 2018-09-27 MED ORDER — AMIODARONE HCL 200 MG PO TABS
200.0000 mg | ORAL_TABLET | Freq: Two times a day (BID) | ORAL | Status: DC
  Administered 2018-09-27 – 2018-11-01 (×70): 200 mg via ORAL
  Filled 2018-09-27 (×70): qty 1

## 2018-09-27 MED ORDER — POLYVINYL ALCOHOL 1.4 % OP SOLN: 1.0000 [drp] | OPHTHALMIC | Status: DC | PRN

## 2018-09-27 MED ORDER — SORBITOL 70 % SOLN
30.0000 mL | Freq: Every day | Status: DC | PRN
  Administered 2018-10-02 – 2018-11-01 (×3): 30 mL via ORAL
  Filled 2018-09-27 (×4): qty 30

## 2018-09-27 MED ORDER — APIXABAN 5 MG PO TABS
5.0000 mg | ORAL_TABLET | Freq: Two times a day (BID) | ORAL | Status: DC
  Administered 2018-09-27 – 2018-11-01 (×70): 5 mg via ORAL
  Filled 2018-09-27 (×70): qty 1

## 2018-09-27 MED ORDER — LIP MEDEX EX OINT: 1.0000 "application " | TOPICAL_OINTMENT | CUTANEOUS | Status: DC | PRN

## 2018-09-27 MED ORDER — ACETAMINOPHEN 650 MG RE SUPP: 650.0000 mg | RECTAL | Status: DC | PRN

## 2018-09-27 MED ORDER — GUAIFENESIN-DM 100-10 MG/5ML PO SYRP: 5.0000 mL | ORAL_SOLUTION | ORAL | Status: DC | PRN

## 2018-09-27 MED ORDER — DILTIAZEM HCL ER COATED BEADS 300 MG PO CP24: 300.0000 mg | ORAL_CAPSULE | Freq: Every day | ORAL | 0 refills | Status: DC

## 2018-09-27 MED ORDER — DILTIAZEM HCL 60 MG PO TABS
90.0000 mg | ORAL_TABLET | Freq: Four times a day (QID) | ORAL | Status: DC
  Administered 2018-09-27 – 2018-11-01 (×138): 90 mg via ORAL
  Filled 2018-09-27 (×139): qty 1

## 2018-09-27 MED ORDER — DIGOXIN 125 MCG PO TABS
0.1250 mg | ORAL_TABLET | Freq: Every day | ORAL | Status: DC
  Administered 2018-09-28 – 2018-11-01 (×34): 0.125 mg via ORAL
  Filled 2018-09-27 (×35): qty 1

## 2018-09-27 MED ORDER — MUSCLE RUB 10-15 % EX CREA: 1.0000 "application " | TOPICAL_CREAM | CUTANEOUS | Status: DC | PRN

## 2018-09-27 MED ORDER — HYDROCORTISONE (PERIANAL) 2.5 % EX CREA: 1.0000 "application " | TOPICAL_CREAM | Freq: Four times a day (QID) | CUTANEOUS | Status: DC | PRN

## 2018-09-27 MED ORDER — INSULIN ASPART 100 UNIT/ML ~~LOC~~ SOLN
0.0000 [IU] | Freq: Three times a day (TID) | SUBCUTANEOUS | Status: DC
  Administered 2018-09-27 – 2018-10-20 (×14): 1 [IU] via SUBCUTANEOUS
  Administered 2018-10-22: 18:00:00 2 [IU] via SUBCUTANEOUS
  Administered 2018-10-23 – 2018-10-31 (×9): 1 [IU] via SUBCUTANEOUS

## 2018-09-27 MED ORDER — ALUM & MAG HYDROXIDE-SIMETH 200-200-20 MG/5ML PO SUSP: 30.0000 mL | ORAL | Status: DC | PRN

## 2018-09-27 MED ORDER — ATORVASTATIN CALCIUM 80 MG PO TABS
80.0000 mg | ORAL_TABLET | Freq: Every day | ORAL | Status: DC
  Administered 2018-09-27 – 2018-10-31 (×35): 80 mg via ORAL
  Filled 2018-09-27 (×35): qty 1

## 2018-09-27 MED ORDER — RESOURCE THICKENUP CLEAR PO POWD
ORAL | Status: DC | PRN
  Filled 2018-09-27: qty 125

## 2018-09-27 NOTE — Discharge Summary (Signed)
Physician Discharge Summary  Rachel Fox:[id number] DOB: 1945-08-26 DOA: 09/20/2018  PCP: Benita Stabile, MD  Admit date: 09/20/2018 Discharge date: 09/27/2018  Admitted From: Home Disposition: CIR  Recommendations for Outpatient Follow-up:  1. Follow up with PCP in 1-2 weeks 2. Please obtain BMP/CBC in one week your next doctors visit.  3. Okay to start Eliquis per neurology, Dr Pearlean Brownie. Follow up with Neurology in 3-4 weeks. Aspirin stopped 4. Home blood pressure medication stopped to allow room for Cardizem. 5. Cont Digoxin and Amiodarone. Check Digoxin level in 3-5 days.    Discharge Condition: Stable CODE STATUS: Full code Diet recommendation: Diabetic/heart healthy  Brief/Interim Summary: 73 year old with history of atrial fibrillation, diabetes mellitus type 2, essential hypertension who was brought to the hospital for evaluation of fall.  She was found to be in mild rhabdomyolysis, atrial fibrillation with RVR and also a large right-sided CVA on the CT of the head.  Neurology consulted who recommended starting Eliquis 3 to 5 days after stroke.  Patient was also seen by cardiology due to uncontrolled atrial fibrillation with RVR, digoxin and amiodarone were added along with Cardizem.  Home blood pressure medications were stopped.  Neurology cleared to start patient on Eliquis, discontinue aspirin. Physical therapy recommended SNF/CIR. rehab team has been consulted.   Discharge Diagnoses:  Principal Problem:   Cerebrovascular accident (CVA) with involvement of left side of body (HCC) Active Problems:   Atrial fibrillation with RVR (HCC)   Hypertension   Rhabdomyolysis   Type 2 diabetes mellitus (HCC)   Hyperlipidemia   Hyperbilirubinemia   Leukocytosis   Hypernatremia   Pressure injury of skin  Moderate to large right-sided acute MCA stroke, embolic, stable -MRI brain without contrastandMRA headand neckwithout contrast(given deranged renal function) -Maintain  Euthermia.  -ASA ordered.  -Echocardiogram-ejection fraction 65% in atrial fibrillation -LDL 121, Lipitor 80 mg daily.  Hemoglobin A1c 6.1. -Frequent neuro checks -Atorvastatin. -Risk factor modification -Neurology team following- appreciate input.  -PT/OT eval, Speech consult-appreciate input -Neurology recommended to stop aspirin and start Eliquis.  Patient cleared to start Eliquis and follow-up outpatient neurology in 3-4 weeks.  Atrial fibrillation with RVR, improved. - Eliquis resumed. - Cardiology team is following.    Unable to crush long-acting Cardizem therefore continue Cardizem 90 mg every 6 hours.  Digoxin and amiodarone-cont per Cardiology, check Dig level in 3-5 days. Cardiology can follow it up. . Monitor for blood pressure.  Mild rhabdomyolysis, resolved -Gentle hydration.  Hypernatremia, resolved -Likely from dehydration.  Continue to follow sodium levels.  Diabetes mellitus type 2 Hypoglycemia secondary to poor oral intake, stable now - Resume metformin upon discharge  Essential hypertension -Micardis and amlodipine on hold due to permissive hypertension.  This will also allow to adjust Cardizem for rate control.   Consultations:  Cardiology  Neurology  Subjective: Feels more or less about the same but left-sided facial droop and left-sided weakness of her body.  No other complaints.  Heart rate is much better controlled.  Discharge Exam: Vitals:   09/27/18 0355 09/27/18 0735  BP: (!) 115/94 136/82  Pulse: 87 85  Resp: 18 18  Temp: 98 F (36.7 C) 98.1 F (36.7 C)  SpO2: 100% 100%   Vitals:   09/26/18 1942 09/26/18 2318 09/27/18 0355 09/27/18 0735  BP: 140/62 122/68 (!) 115/94 136/82  Pulse: 91 99 87 85  Resp: 20 18 18 18   Temp: 98.3 F (36.8 C) 98.2 F (36.8 C) 98 F (36.7 C) 98.1 F (36.7 C)  TempSrc: Oral Oral Oral Oral  SpO2: 100% 98% 100% 100%  Weight:      Height:        General: Pt is alert, awake, not in acute  distress Cardiovascular: RRR, S1/S2 +, no rubs, no gallops Respiratory: CTA bilaterally, no wheezing, no rhonchi Abdominal: Soft, NT, ND, bowel sounds + Extremities: no edema, no cyanosis Left-sided facial asymmetry noted.  Left upper and lower extremity strength 1/5.  Right upper and lower extremity strength 4+/5.  Discharge Instructions  Discharge Instructions    Call MD for:  extreme fatigue   Complete by: As directed    Call MD for:  persistant dizziness or light-headedness   Complete by: As directed    Call MD for:  persistant nausea and vomiting   Complete by: As directed    Call MD for:  redness, tenderness, or signs of infection (pain, swelling, redness, odor or green/yellow discharge around incision site)   Complete by: As directed    Diet - low sodium heart healthy   Complete by: As directed    Discharge instructions   Complete by: As directed    You were cared for by a hospitalist during your hospital stay. If you have any questions about your discharge medications or the care you received while you were in the hospital after you are discharged, you can call the unit and asked to speak with the hospitalist on call if the hospitalist that took care of you is not available. Once you are discharged, your primary care physician will handle any further medical issues. Please note that NO REFILLS for any discharge medications will be authorized once you are discharged, as it is imperative that you return to your primary care physician (or establish a relationship with a primary care physician if you do not have one) for your aftercare needs so that they can reassess your need for medications and monitor your lab values.  Please request your Prim.MD to go over all Hospital Tests and Procedure/Radiological results at the follow up, please get all Hospital records sent to your Prim MD by signing hospital release before you go home.  Get CBC, CMP, 2 view Chest X ray checked  by Primary MD  during your next visit or SNF MD in 5-7 days ( we routinely change or add medications that can affect your baseline labs and fluid status, therefore we recommend that you get the mentioned basic workup next visit with your PCP, your PCP may decide not to get them or add new tests based on their clinical decision)  On your next visit with your primary care physician please Get Medicines reviewed and adjusted.  If you experience worsening of your admission symptoms, develop shortness of breath, life threatening emergency, suicidal or homicidal thoughts you must seek medical attention immediately by calling 911 or calling your MD immediately  if symptoms less severe.  You Must read complete instructions/literature along with all the possible adverse reactions/side effects for all the Medicines you take and that have been prescribed to you. Take any new Medicines after you have completely understood and accpet all the possible adverse reactions/side effects.   Do not drive, operate heavy machinery, perform activities at heights, swimming or participation in water activities or provide baby sitting services if your were admitted for syncope or siezures until you have seen by Primary MD or a Neurologist and advised to do so again.  Do not drive when taking Pain medications.   Increase activity slowly  Complete by: As directed      Allergies as of 09/27/2018   No Known Allergies     Medication List    STOP taking these medications   amLODipine 10 MG tablet Commonly known as: NORVASC   metoprolol-hydrochlorothiazide 50-25 MG tablet Commonly known as: LOPRESSOR HCT   telmisartan 80 MG tablet Commonly known as: MICARDIS     TAKE these medications   amiodarone 200 MG tablet Commonly known as: PACERONE Take 1 tablet (200 mg total) by mouth 2 (two) times daily.   apixaban 5 MG Tabs tablet Commonly known as: ELIQUIS Take 1 tablet (5 mg total) by mouth 2 (two) times daily.   atorvastatin 80  MG tablet Commonly known as: LIPITOR Take 1 tablet (80 mg total) by mouth daily at 6 PM.   digoxin 0.125 MG tablet Commonly known as: LANOXIN Take 1 tablet (0.125 mg total) by mouth daily.   diltiazem 90 MG tablet Commonly known as: CARDIZEM Take 1 tablet (90 mg total) by mouth 4 (four) times daily.   metFORMIN 500 MG tablet Commonly known as: GLUCOPHAGE Take 1 tablet by mouth 2 (two) times a day.      Follow-up Information    Celene Squibb, MD. Schedule an appointment as soon as possible for a visit in 1 week(s).   Specialty: Internal Medicine Contact information: [address] [place] [phone]        Arnoldo Lenis, MD .   Specialty: Cardiology Contact information: [address] [place] [address] [phone]        Garvin Fila, MD Follow up in 3 week(s).   Specialties: Neurology, Radiology Contact information: [address] [place] [address] [phone]          No Known Allergies  You were cared for by a hospitalist during your hospital stay. If you have any questions about your discharge medications or the care you received while you were in the hospital after you are discharged, you can call the unit and asked to speak with the hospitalist on call if the hospitalist that took care of you is not available. Once you are discharged, your primary care physician will handle any further medical issues. Please note that no refills for any discharge medications will be authorized once you are discharged, as it is imperative that you return to your primary care physician (or establish a relationship with a primary care physician if you do not have one) for your aftercare needs so that they can reassess your need for medications and monitor your lab values.   Procedures/Studies: Dg Chest 1 View  Result Date: 09/20/2018 CLINICAL DATA:  Left side weak this EXAM: CHEST  1 VIEW COMPARISON:  Portable exam 1149 hours compared to 02/18/2008 FINDINGS:  Enlargement of cardiac silhouette. Mediastinal contours and pulmonary vascularity normal. Lungs clear. No infiltrate, pleural effusion or pneumothorax. Bones demineralized. IMPRESSION: Enlargement of cardiac silhouette. No acute abnormalities. Electronically Signed   By: Lavonia Dana M.D.   On: 09/20/2018 12:20   Ct Head Wo Contrast  Result Date: 09/20/2018 CLINICAL DATA:  Left-sided weakness, facial droop, and slurred speech. EXAM: CT HEAD WITHOUT CONTRAST CT CERVICAL SPINE WITHOUT CONTRAST TECHNIQUE: Multidetector CT imaging of the head and cervical spine was performed following the standard protocol without intravenous contrast. Multiplanar CT image reconstructions of the cervical spine were also generated. COMPARISON:  None. FINDINGS: CT HEAD FINDINGS Brain: A moderate-sized region of low density involving cortex and white matter in the right temporal,  parietal, and frontal lobes and insula is consistent with a subacute infarct. There is also some right basal ganglia involvement. There is regional mass effect with slight effacement of the right lateral ventricle and 3 mm of leftward midline shift. No acute intracranial hemorrhage or extra-axial fluid collection is identified. They were is no age advanced atrophy. Vascular: Calcified atherosclerosis at the skull base. Left cavernous sinus gas, possibly related to vena puncture. Hyperdense appearance of the right MCA in the sylvian fissure in the area of infarct. Skull: No fracture or focal osseous lesion. Sinuses/Orbits: Visualized paranasal sinuses and mastoid air cells are clear. Orbits are unremarkable. Other: None. CT CERVICAL SPINE FINDINGS Alignment: Straightening/slight reversal of the normal cervical lordosis. No significant listhesis. Skull base and vertebrae: No acute fracture or suspicious osseous lesion. Soft tissues and spinal canal: No prevertebral fluid or swelling. No visible canal hematoma. Disc levels: Moderate cervical spondylosis. Multilevel  neural foraminal stenosis due to uncovertebral spurring, moderate to severe bilaterally at C4-5. Upper chest: Clear lung apices. Other: None. IMPRESSION: 1. Moderately large subacute right MCA infarct with 3 mm of leftward midline shift. No hemorrhage. 2. No acute cervical spine fracture. Electronically Signed   By: Sebastian Ache M.D.   On: 09/20/2018 12:44   Ct Cervical Spine Wo Contrast  Result Date: 09/20/2018 CLINICAL DATA:  Left-sided weakness, facial droop, and slurred speech. EXAM: CT HEAD WITHOUT CONTRAST CT CERVICAL SPINE WITHOUT CONTRAST TECHNIQUE: Multidetector CT imaging of the head and cervical spine was performed following the standard protocol without intravenous contrast. Multiplanar CT image reconstructions of the cervical spine were also generated. COMPARISON:  None. FINDINGS: CT HEAD FINDINGS Brain: A moderate-sized region of low density involving cortex and white matter in the right temporal, parietal, and frontal lobes and insula is consistent with a subacute infarct. There is also some right basal ganglia involvement. There is regional mass effect with slight effacement of the right lateral ventricle and 3 mm of leftward midline shift. No acute intracranial hemorrhage or extra-axial fluid collection is identified. They were is no age advanced atrophy. Vascular: Calcified atherosclerosis at the skull base. Left cavernous sinus gas, possibly related to vena puncture. Hyperdense appearance of the right MCA in the sylvian fissure in the area of infarct. Skull: No fracture or focal osseous lesion. Sinuses/Orbits: Visualized paranasal sinuses and mastoid air cells are clear. Orbits are unremarkable. Other: None. CT CERVICAL SPINE FINDINGS Alignment: Straightening/slight reversal of the normal cervical lordosis. No significant listhesis. Skull base and vertebrae: No acute fracture or suspicious osseous lesion. Soft tissues and spinal canal: No prevertebral fluid or swelling. No visible canal  hematoma. Disc levels: Moderate cervical spondylosis. Multilevel neural foraminal stenosis due to uncovertebral spurring, moderate to severe bilaterally at C4-5. Upper chest: Clear lung apices. Other: None. IMPRESSION: 1. Moderately large subacute right MCA infarct with 3 mm of leftward midline shift. No hemorrhage. 2. No acute cervical spine fracture. Electronically Signed   By: Sebastian Ache M.D.   On: 09/20/2018 12:44   US Carotid Bilateral (at [facility] And [facility] Only)  Result Date: 09/21/2018 CLINICAL DATA:  CVA.  History of hypertension and diabetes. EXAM: BILATERAL CAROTID DUPLEX ULTRASOUND TECHNIQUE: Wallace Cullens scale imaging, color Doppler and duplex ultrasound were performed of bilateral carotid and vertebral arteries in the neck. COMPARISON:  None. FINDINGS: Criteria: Quantification of carotid stenosis is based on velocity parameters that correlate the residual internal carotid diameter with NASCET-based stenosis levels, using the diameter of the distal internal carotid lumen as the denominator for stenosis  measurement. The following velocity measurements were obtained: RIGHT ICA: 35/7 cm/sec CCA: 73/9 cm/sec SYSTOLIC ICA/CCA RATIO:  0.5 ECA: 96 cm/sec LEFT ICA: 58/16 cm/sec CCA: 83/10 cm/sec SYSTOLIC ICA/CCA RATIO:  0.7 ECA: 52 cm/sec RIGHT CAROTID ARTERY: There is no grayscale evidence of significant intimal thickening or atherosclerotic plaque affecting the interrogated portions of the right carotid system. There are no elevated peak systolic velocities within the interrogated course of the right internal carotid artery to suggest a hemodynamically significant stenosis. RIGHT VERTEBRAL ARTERY:  Antegrade flow LEFT CAROTID ARTERY: There is a minimal amount of eccentric echogenic plaque within the left carotid bulb (images 45 and 47), extending to involve the origin and proximal aspects of the left internal carotid artery (image 56), not resulting in elevated peak systolic velocities within the interrogated course of  the left internal carotid artery to suggest a hemodynamically significant stenosis. LEFT VERTEBRAL ARTERY:  Antegrade Flow Note is made of a apparent cardiac arrhythmia IMPRESSION: 1. Minimal amount of left-sided atherosclerotic plaque, not resulting in a hemodynamically significant stenosis. 2. Unremarkable sonographic evaluation of right carotid system. 3. Incidentally noted cardiac arrhythmia. Further evaluation with ECG monitoring could be performed as indicated Electronically Signed   By: Simonne Come M.D.   On: 09/21/2018 03:18   Dg Swallowing Func-speech Pathology  Result Date: 09/21/2018 Objective Swallowing Evaluation: Type of Study: MBS-Modified Barium Swallow Study  Patient Details Name: KENDYL FESTA MRN: [id number] Date of Birth: 22-Oct-1945 Today's Date: 09/21/2018 Time: SLP Start Time (ACUTE ONLY): 1020 -SLP Stop Time (ACUTE ONLY): 1039 SLP Time Calculation (min) (ACUTE ONLY): 19 min Past Medical History: Past Medical History: Diagnosis Date . High cholesterol  . Hypertension  . Type 2 diabetes mellitus (HCC)  Past Surgical History: Past Surgical History: Procedure Laterality Date . ABDOMINAL HYSTERECTOMY   . TUBAL LIGATION   HPI: 73 year old with history of atrial fibrillation, diabetes mellitus type 2, essential hypertension who was brought to the hospital for evaluation of fall.  She was found to be in mild rhabdomyolysis, atrial fibrillation with RVR and also a large right-sided MCA CVA on the CT of the head  Subjective: alert and asking for water Assessment / Plan / Recommendation CHL IP CLINICAL IMPRESSIONS 09/21/2018 Clinical Impression Pt presents with a mild to moderate dysphagia with primarily oral deficits. She has left-sided labial and lingual weakness that results in anterior and posterior spillage of boluses, but with improved containment and control with use of straw. Premature spillage of thin liquids is penetrated and silently aspirated in small quantities. Penetration with nectar thick  liquids is trace, shallow, and clears spontaneously either as she completes the swallow or with a reflexive throat clear. Nectar thick liquids did reach the true vocal cords x1 but was still ejected. This occurred in the setting of inability to fully clear soft solids from her oral cavity, with pt needing a liquid wash to assist. Her posterior oral transit was delayed with all solids but more efficient with purees. Recommend starting Dys 1 diet and nectar thick liquids with additional SLP f/u to maximize swallow function and safety. SLP Visit Diagnosis Dysphagia, oropharyngeal phase (R13.12) Attention and concentration deficit following -- Frontal lobe and executive function deficit following -- Impact on safety and function Mild aspiration risk   CHL IP TREATMENT RECOMMENDATION 09/21/2018 Treatment Recommendations Therapy as outlined in treatment plan below   Prognosis 09/21/2018 Prognosis for Safe Diet Advancement Good Barriers to Reach Goals -- Barriers/Prognosis Comment -- CHL IP DIET RECOMMENDATION 09/21/2018 SLP Diet Recommendations  Dysphagia 1 (Puree) solids;Nectar thick liquid Liquid Administration via Straw Medication Administration Crushed with puree Compensations Slow rate;Small sips/bites;Monitor for anterior loss;Lingual sweep for clearance of pocketing Postural Changes Seated upright at 90 degrees   CHL IP OTHER RECOMMENDATIONS 09/21/2018 Recommended Consults -- Oral Care Recommendations Oral care BID Other Recommendations Order thickener from pharmacy;Prohibited food (jello, ice cream, thin soups);Remove water pitcher   CHL IP FOLLOW UP RECOMMENDATIONS 09/21/2018 Follow up Recommendations Inpatient Rehab   CHL IP FREQUENCY AND DURATION 09/21/2018 Speech Therapy Frequency (ACUTE ONLY) min 2x/week Treatment Duration 2 weeks      CHL IP ORAL PHASE 09/21/2018 Oral Phase Impaired Oral - Pudding Teaspoon -- Oral - Pudding Cup -- Oral - Honey Teaspoon -- Oral - Honey Cup -- Oral - Nectar Teaspoon -- Oral - Nectar Cup  Premature spillage Oral - Nectar Straw Premature spillage Oral - Thin Teaspoon -- Oral - Thin Cup Premature spillage Oral - Thin Straw Premature spillage Oral - Puree Delayed oral transit Oral - Mech Soft Delayed oral transit;Impaired mastication Oral - Regular -- Oral - Multi-Consistency -- Oral - Pill -- Oral Phase - Comment --  CHL IP PHARYNGEAL PHASE 09/21/2018 Pharyngeal Phase Impaired Pharyngeal- Pudding Teaspoon -- Pharyngeal -- Pharyngeal- Pudding Cup -- Pharyngeal -- Pharyngeal- Honey Teaspoon -- Pharyngeal -- Pharyngeal- Honey Cup -- Pharyngeal -- Pharyngeal- Nectar Teaspoon -- Pharyngeal -- Pharyngeal- Nectar Cup Penetration/Aspiration before swallow Pharyngeal Material enters airway, remains ABOVE vocal cords then ejected out Pharyngeal- Nectar Straw Penetration/Aspiration before swallow Pharyngeal Material enters airway, CONTACTS cords and then ejected out Pharyngeal- Thin Teaspoon -- Pharyngeal -- Pharyngeal- Thin Cup Penetration/Aspiration before swallow Pharyngeal Material enters airway, passes BELOW cords without attempt by patient to eject out (silent aspiration) Pharyngeal- Thin Straw Penetration/Aspiration before swallow Pharyngeal Material enters airway, passes BELOW cords without attempt by patient to eject out (silent aspiration) Pharyngeal- Puree WFL Pharyngeal -- Pharyngeal- Mechanical Soft WFL Pharyngeal -- Pharyngeal- Regular -- Pharyngeal -- Pharyngeal- Multi-consistency -- Pharyngeal -- Pharyngeal- Pill -- Pharyngeal -- Pharyngeal Comment --  CHL IP CERVICAL ESOPHAGEAL PHASE 09/21/2018 Cervical Esophageal Phase WFL Pudding Teaspoon -- Pudding Cup -- Honey Teaspoon -- Honey Cup -- Nectar Teaspoon -- Nectar Cup -- Nectar Straw -- Thin Teaspoon -- Thin Cup -- Thin Straw -- Puree -- Mechanical Soft -- Regular -- Multi-consistency -- Pill -- Cervical Esophageal Comment -- Virl AxeLaura P Nix 09/21/2018, 11:23 AM  Ivar DrapeLaura Nix, M.A. CCC-SLP Acute Rehabilitation Services Pager [phone] Office [phone]              Mm 3d Screen Breast Bilateral  Result Date: 09/13/2018 CLINICAL DATA:  Screening. EXAM: DIGITAL SCREENING BILATERAL MAMMOGRAM WITH TOMO AND CAD COMPARISON:  Previous exam(s). ACR Breast Density Category c: The breast tissue is heterogeneously dense, which may obscure small masses. FINDINGS: There are no findings suspicious for malignancy. Images were processed with CAD. IMPRESSION: No mammographic evidence of malignancy. A result letter of this screening mammogram will be mailed directly to the patient. RECOMMENDATION: Screening mammogram in one year. (Code:SM-B-01Y) BI-RADS CATEGORY  1: Negative. Electronically Signed   By: Harmon PierJeffrey  Hu M.D.   On: 09/13/2018 15:56   [facility] [place] Transcranial Doppler  Result Date: 09/22/2018  Transcranial Doppler Indications: Stroke. Comparison Study: No prior studies. Performing Technologist: Chanda BusingGregory Collins RVT  Examination Guidelines: A complete evaluation includes B-mode imaging, spectral Doppler, color Doppler, and power Doppler as needed of all accessible portions of each vessel. Bilateral testing is considered an integral part of a complete examination. Limited examinations for reoccurring indications  may be performed as noted.  +----------+-------------+----------+-----------+------------------+ RIGHT TCD Right VM (cm)Depth (cm)Pulsatility     Comment       +----------+-------------+----------+-----------+------------------+ MCA                                         Unable to insonate +----------+-------------+----------+-----------+------------------+ ACA                                         Unable to insonate +----------+-------------+----------+-----------+------------------+ Term ICA      16.00                 1.31                       +----------+-------------+----------+-----------+------------------+ PCA           27.00                 0.88                        +----------+-------------+----------+-----------+------------------+ Opthalmic     20.00                 1.61                       +----------+-------------+----------+-----------+------------------+ ICA siphon    25.00                 1.66                       +----------+-------------+----------+-----------+------------------+ Vertebral    -23.00                 1.38                       +----------+-------------+----------+-----------+------------------+  +----------+------------+----------+-----------+-------+ LEFT TCD  Left VM (cm)Depth (cm)PulsatilityComment +----------+------------+----------+-----------+-------+ MCA          39.00                 1.44            +----------+------------+----------+-----------+-------+ ACA          -39.00                0.95            +----------+------------+----------+-----------+-------+ Term ICA     23.00                 1.04            +----------+------------+----------+-----------+-------+ PCA          23.00                 0.86            +----------+------------+----------+-----------+-------+ Opthalmic    17.00                 1.18            +----------+------------+----------+-----------+-------+ ICA siphon   16.00                 1.16            +----------+------------+----------+-----------+-------+ Vertebral    -28.00  1.18            +----------+------------+----------+-----------+-------+  +------------+-------+------------------+             VM cm/s     Comment       +------------+-------+------------------+ Prox Basilar-41.00        1.02        +------------+-------+------------------+ Dist Basilar       Unable to insonate +------------+-------+------------------+ Summary:  Absent right temporal window limits evaluation of anetrior circulation on right.Normal mean flow velocities in remaining identified vessles of anterior and posterior cerebral  circulation.Globally elevated pulsaitity indices suggest diffus eintracranial atherosclerosis likely. *See table(s) above for measurements and observations.  Diagnosing physician: Delia Heady MD Electronically signed by Delia Heady MD on 09/22/2018 at 1:02:41 PM.    Final       The results of significant diagnostics from this hospitalization (including imaging, microbiology, ancillary and laboratory) are listed below for reference.     Microbiology: Recent Results (from the past 240 hour(s))  SARS Coronavirus 2 (CEPHEID - Performed in [facility] hospital lab), Hosp Order     Status: None   Collection Time: 09/20/18 11:10 AM   Specimen: Nasopharyngeal Swab  Result Value Ref Range Status   SARS Coronavirus 2 NEGATIVE NEGATIVE Final    Comment: (NOTE) If result is NEGATIVE SARS-CoV-2 target nucleic acids are NOT DETECTED. The SARS-CoV-2 RNA is generally detectable in upper and lower  respiratory specimens during the acute phase of infection. The lowest  concentration of SARS-CoV-2 viral copies this assay can detect is 250  copies / mL. A negative result does not preclude SARS-CoV-2 infection  and should not be used as the sole basis for treatment or other  patient management decisions.  A negative result may occur with  improper specimen collection / handling, submission of specimen other  than nasopharyngeal swab, presence of viral mutation(s) within the  areas targeted by this assay, and inadequate number of viral copies  (<250 copies / mL). A negative result must be combined with clinical  observations, patient history, and epidemiological information. If result is POSITIVE SARS-CoV-2 target nucleic acids are DETECTED. The SARS-CoV-2 RNA is generally detectable in upper and lower  respiratory specimens dur ing the acute phase of infection.  Positive  results are indicative of active infection with SARS-CoV-2.  Clinical  correlation with patient history and other diagnostic  information is  necessary to determine patient infection status.  Positive results do  not rule out bacterial infection or co-infection with other viruses. If result is PRESUMPTIVE POSTIVE SARS-CoV-2 nucleic acids MAY BE PRESENT.   A presumptive positive result was obtained on the submitted specimen  and confirmed on repeat testing.  While 2019 novel coronavirus  (SARS-CoV-2) nucleic acids may be present in the submitted sample  additional confirmatory testing may be necessary for epidemiological  and / or clinical management purposes  to differentiate between  SARS-CoV-2 and other Sarbecovirus currently known to infect humans.  If clinically indicated additional testing with an alternate test  methodology [phone]) is advised. The SARS-CoV-2 RNA is generally  detectable in upper and lower respiratory sp ecimens during the acute  phase of infection. The expected result is Negative. Fact Sheet for Patients:  BoilerBrush.com.cy Fact Sheet for Healthcare Providers: https://pope.com/ This test is not yet approved or cleared by the [place] FDA and has been authorized for detection and/or diagnosis of SARS-CoV-2 by FDA under an Emergency Use Authorization (EUA).  This EUA will remain in effect (meaning this test  can be used) for the duration of the COVID-19 declaration under Section 564(b)(1) of the Act, 21 U.S.C. section 360bbb-3(b)(1), unless the authorization is terminated or revoked sooner. Performed at [facility], [address], [place], [place] [address]   Urine culture     Status: None   Collection Time: 09/20/18 12:46 PM   Specimen: Urine, Clean Catch  Result Value Ref Range Status   Specimen Description   Final    URINE, CLEAN CATCH Performed at [facility], [address], [place], [place] [address]    Special Requests   Final    NONE Performed at [facility], [address], [place], [place] [address]    Culture    Final    Multiple bacterial morphotypes present, none predominant. Suggest appropriate recollection if clinically indicated.   Report Status 09/21/2018 FINAL  Final     Labs: BNP (last 3 results) Recent Labs    09/20/18 1142  BNP 485.0*   Basic Metabolic Panel: Recent Labs  Lab 09/20/18 1142 09/22/18 0313 09/23/18 0419 09/24/18 0359 09/25/18 0458 09/26/18 0512 09/27/18 0520  NA 146* 141 143 143 141 140  --   K 3.8 4.0 4.5 4.7 4.3 3.7  --   CL 107 108 111 110 109 104  --   CO2 23 26 25 27  21* 26  --   GLUCOSE 132* 114* 104* 105* 118* 122*  --   BUN 31* 12 13 8 8 9   --   CREATININE 1.04* 0.92 0.69 0.75 0.70 0.88 0.86  CALCIUM 10.4* 8.5* 8.5* 8.7* 9.0 9.3  --   MG 2.3 1.9 1.9 1.7 1.7 2.0  --   PHOS 2.7  --   --   --   --   --   --    Liver Function Tests: Recent Labs  Lab 09/20/18 1142  AST 63*  ALT 30  ALKPHOS 61  BILITOT 1.3*  PROT 8.5*  ALBUMIN 4.2   No results for input(s): LIPASE, AMYLASE in the last 168 hours. No results for input(s): AMMONIA in the last 168 hours. CBC: Recent Labs  Lab 09/20/18 1142  09/22/18 0313 09/23/18 0419 09/24/18 0359 09/25/18 0458 09/26/18 0512  WBC 14.0*  --  11.5* 10.9* 9.7 9.2 9.6  NEUTROABS 11.5*  --   --   --   --   --   --   HGB 15.5*  --  13.8 13.3 12.6 13.1 13.6  HCT 49.2*   < > 43.7 42.0 40.1 40.6 42.0  MCV 84.7  --  86.4 85.9 86.4 84.1 85.2  PLT 207  --  175 181 188 196 227   < > = values in this interval not displayed.   Cardiac Enzymes: Recent Labs  Lab 09/21/18 0525 09/22/18 0313 09/23/18 0419 09/24/18 0359 09/25/18 0458  CKTOTAL 2,883* 1,637* 789* 462* 358*   BNP: Invalid input(s): POCBNP CBG: Recent Labs  Lab 09/25/18 2152 09/26/18 0608 09/26/18 1250 09/26/18 2216 09/27/18 0712  GLUCAP 120* 118* 111* 134* 128*   D-Dimer No results for input(s): DDIMER in the last 72 hours. Hgb A1c No results for input(s): HGBA1C in the last 72 hours. Lipid Profile No results for input(s): CHOL, HDL,  LDLCALC, TRIG, CHOLHDL, LDLDIRECT in the last 72 hours. Thyroid function studies No results for input(s): TSH, T4TOTAL, T3FREE, THYROIDAB in the last 72 hours.  Invalid input(s): FREET3 Anemia work up No results for input(s): VITAMINB12, FOLATE, FERRITIN, TIBC, IRON, RETICCTPCT in the last 72 hours. Urinalysis  Component Value Date/Time   COLORURINE YELLOW 09/20/2018 1246   APPEARANCEUR CLEAR 09/20/2018 1246   LABSPEC 1.023 09/20/2018 1246   PHURINE 5.0 09/20/2018 1246   GLUCOSEU NEGATIVE 09/20/2018 1246   HGBUR MODERATE (A) 09/20/2018 1246   BILIRUBINUR NEGATIVE 09/20/2018 1246   KETONESUR 20 (A) 09/20/2018 1246   PROTEINUR 100 (A) 09/20/2018 1246   NITRITE NEGATIVE 09/20/2018 1246   LEUKOCYTESUR TRACE (A) 09/20/2018 1246   Sepsis Labs Invalid input(s): PROCALCITONIN,  WBC,  LACTICIDVEN Microbiology Recent Results (from the past 240 hour(s))  SARS Coronavirus 2 (CEPHEID - Performed in [facility] hospital lab), Hosp Order     Status: None   Collection Time: 09/20/18 11:10 AM   Specimen: Nasopharyngeal Swab  Result Value Ref Range Status   SARS Coronavirus 2 NEGATIVE NEGATIVE Final    Comment: (NOTE) If result is NEGATIVE SARS-CoV-2 target nucleic acids are NOT DETECTED. The SARS-CoV-2 RNA is generally detectable in upper and lower  respiratory specimens during the acute phase of infection. The lowest  concentration of SARS-CoV-2 viral copies this assay can detect is 250  copies / mL. A negative result does not preclude SARS-CoV-2 infection  and should not be used as the sole basis for treatment or other  patient management decisions.  A negative result may occur with  improper specimen collection / handling, submission of specimen other  than nasopharyngeal swab, presence of viral mutation(s) within the  areas targeted by this assay, and inadequate number of viral copies  (<250 copies / mL). A negative result must be combined with clinical  observations, patient  history, and epidemiological information. If result is POSITIVE SARS-CoV-2 target nucleic acids are DETECTED. The SARS-CoV-2 RNA is generally detectable in upper and lower  respiratory specimens dur ing the acute phase of infection.  Positive  results are indicative of active infection with SARS-CoV-2.  Clinical  correlation with patient history and other diagnostic information is  necessary to determine patient infection status.  Positive results do  not rule out bacterial infection or co-infection with other viruses. If result is PRESUMPTIVE POSTIVE SARS-CoV-2 nucleic acids MAY BE PRESENT.   A presumptive positive result was obtained on the submitted specimen  and confirmed on repeat testing.  While 2019 novel coronavirus  (SARS-CoV-2) nucleic acids may be present in the submitted sample  additional confirmatory testing may be necessary for epidemiological  and / or clinical management purposes  to differentiate between  SARS-CoV-2 and other Sarbecovirus currently known to infect humans.  If clinically indicated additional testing with an alternate test  methodology [phone]([id number]) is advised. The SARS-CoV-2 RNA is generally  detectable in upper and lower respiratory sp ecimens during the acute  phase of infection. The expected result is Negative. Fact Sheet for Patients:  BoilerBrush.com.cyhttps://www.fda.gov/media/136312/download Fact Sheet for Healthcare Providers: https://pope.com/https://www.fda.gov/media/136313/download This test is not yet approved or cleared by the [place] FDA and has been authorized for detection and/or diagnosis of SARS-CoV-2 by FDA under an Emergency Use Authorization (EUA).  This EUA will remain in effect (meaning this test can be used) for the duration of the COVID-19 declaration under Section 564(b)(1) of the Act, 21 U.S.C. section 360bbb-3(b)(1), unless the authorization is terminated or revoked sooner. Performed at [facility], [address] [place], [place] [address]   Urine  culture     Status: None   Collection Time: 09/20/18 12:46 PM   Specimen: Urine, Clean Catch  Result Value Ref Range Status   Specimen Description   Final  URINE, CLEAN CATCH Performed at [facility], [address] [place], [place] [address]    Special Requests   Final    NONE Performed at [facility], [address] [place], [place] [address]    Culture   Final    Multiple bacterial morphotypes present, none predominant. Suggest appropriate recollection if clinically indicated.   Report Status 09/21/2018 FINAL  Final     Time coordinating discharge:  I have spent 35 minutes face to face with the patient and on the ward discussing the patients care, assessment, plan and disposition with other care givers. >50% of the time was devoted counseling the patient about the risks and benefits of treatment/Discharge disposition and coordinating care.   SIGNED:   Dimple Nanas, MD  [facility] 09/27/2018, 10:59 AM   If 7PM-7AM, please contact night-coverage [email or web address]

## 2018-09-27 NOTE — Progress Notes (Signed)
Physical Therapy Treatment Patient Details Name: Rachel Fox MRN: [id number] DOB: 07/15/1945 Today's Date: 09/27/2018    History of Present Illness Pt is a 73 y/o female with a PMH significant for HTN, DM. She presents after being found on the floor after ~2 days. CT revealed moderate to large R-sided subacute MCA infarct. Pt was also found to be in a-fib with RVR and with mild rhabdomyolitis.     PT Comments    Patient seen for mobility progression. This session focused on bed mobility, functional transfer training, and seated/standing balance. Current plan remains appropriate.    Follow Up Recommendations  CIR     Equipment Recommendations  Wheelchair (measurements PT);Wheelchair cushion (measurements PT);3in1 (PT);Hospital bed    Recommendations for Other Services       Precautions / Restrictions Precautions Precautions: Fall Precaution Comments: L side weak/flaccid, right gaze preference, L shoulder pain (risk for subluxation), pressure injury from laying on floor PTA Restrictions Weight Bearing Restrictions: No    Mobility  Bed Mobility Overal bed mobility: Needs Assistance Bed Mobility: Rolling;Sidelying to Sit Rolling: Mod assist Sidelying to sit: Mod assist;+2 for physical assistance;HOB elevated       General bed mobility comments: assist to bring bilat from EOB and to elevate trunk into sitting; cues for sequencing and use of rail  Transfers Overall transfer level: Needs assistance   Transfers: Sit to/from Stand Sit to Stand: +2 physical assistance;Max assist;Mod assist         General transfer comment: pt stood X3 trials utilizing Stedy standing frame; increased assistance required to stand from [facility] due to heavy L lateral lean; L UE supported by therapist and assist required to power up into standing and to correct midline posture   Ambulation/Gait                 Stairs             Wheelchair Mobility    Modified Rankin (Stroke  Patients Only) Modified Rankin (Stroke Patients Only) Pre-Morbid Rankin Score: No symptoms Modified Rankin: Severe disability     Balance Overall balance assessment: Needs assistance Sitting-balance support: Feet supported;Single extremity supported Sitting balance-Leahy Scale: Poor   Postural control: Left lateral lean Standing balance support: Single extremity supported Standing balance-Leahy Scale: Poor                              Cognition Arousal/Alertness: Awake/alert Behavior During Therapy: WFL for tasks assessed/performed Overall Cognitive Status: Impaired/Different from baseline Area of Impairment: Attention;Following commands;Safety/judgement;Awareness;Problem solving                   Current Attention Level: Sustained   Following Commands: Follows one step commands consistently;Follows one step commands with increased time(on R side) Safety/Judgement: Decreased awareness of safety;Decreased awareness of deficits Awareness: Emergent Problem Solving: Difficulty sequencing;Requires verbal cues;Requires tactile cues        Exercises      General Comments General comments (skin integrity, edema, etc.): pressure ulcer on buttocks      Pertinent Vitals/Pain Pain Assessment: Faces Faces Pain Scale: Hurts little more Pain Location: L shoulder Pain Descriptors / Indicators: Grimacing;Guarding Pain Intervention(s): Limited activity within patient's tolerance;Monitored during session;Repositioned    Home Living     Available Help at Discharge: Family;Available 24 hours/day(son, Fatima Sanger and cousin , Alden Benjamin to provide 24/7 assist)   Home Access: Other (comment)(side entrance 1 step)   Home Layout: One level  Prior Function            PT Goals (current goals can now be found in the care plan section) Acute Rehab PT Goals Patient Stated Goal: Decrease L shoulder pain Progress towards PT goals: Progressing toward goals     Frequency    Min 4X/week      PT Plan Current plan remains appropriate    Co-evaluation              AM-PAC PT "6 Clicks" Mobility   Outcome Measure  Help needed turning from your back to your side while in a flat bed without using bedrails?: A Lot Help needed moving from lying on your back to sitting on the side of a flat bed without using bedrails?: A Lot Help needed moving to and from a bed to a chair (including a wheelchair)?: A Lot Help needed standing up from a chair using your arms (e.g., wheelchair or bedside chair)?: A Lot Help needed to walk in hospital room?: Total Help needed climbing 3-5 steps with a railing? : Total 6 Click Score: 10    End of Session Equipment Utilized During Treatment: Gait belt Activity Tolerance: Patient tolerated treatment well Patient left: in chair;with call bell/phone within reach;with chair alarm set Nurse Communication: Mobility status PT Visit Diagnosis: Other symptoms and signs involving the nervous system (R29.898);Hemiplegia and hemiparesis Hemiplegia - Right/Left: Left Hemiplegia - dominant/non-dominant: Non-dominant Hemiplegia - caused by: Cerebral infarction     Time: [id number] PT Time Calculation (min) (ACUTE ONLY): 31 min  Charges:  $Gait Training: 8-22 mins $Therapeutic Activity: 8-22 mins                     Rachel Fox, PTA Acute Rehabilitation Services Pager: [phone]) [phone] Office: [phone]     Rachel Fox 09/27/2018, 3:26 PM

## 2018-09-27 NOTE — Progress Notes (Signed)
Patient arrived to unit, alert and oriented. No pain at this time. Discussed unit activities. Patient verbalized agreement to call for all assistance.

## 2018-09-27 NOTE — Progress Notes (Signed)
Inpatient Rehabilitation Admissions Coordinator  I met with patient at bedside and made contact with Dr. Reesa Chew to make the arrangments to admit pt to CIR today. I contacted her niece, DeeDee and she is aware and in agreement to admit. I will make the arrangments.  Danne Baxter, RN, MSN Rehab Admissions Coordinator [phone] 09/27/2018 12:53 PM

## 2018-09-27 NOTE — TOC Transition Note (Signed)
Transition of Care [facility]) - CM/SW Discharge Note   Patient Details  Name: NELMA PHAGAN MRN: [id number] Date of Birth: 1945/04/05  Transition of Care [facility]) CM/SW Contact:  Pollie Friar, RN Phone Number: 09/27/2018, 12:55 PM   Clinical Narrative:    Pt discharging to CIR today. CM signing off.   Final next level of care: IP Rehab Facility Barriers to Discharge: No Barriers Identified   Patient Goals and CMS Choice   CMS Medicare.gov Compare Post Acute Care list provided to:: Patient Represenative (must comment)(pts niece) Choice offered to / list presented to : (niece)  Discharge Placement                       Discharge Plan and Services In-house Referral: Clinical Social Work Discharge Planning Services: CM Consult Post Acute Care Choice: Skilled Nursing Facility                               Social Determinants of Health (SDOH) Interventions     Readmission Risk Interventions No flowsheet data found.

## 2018-09-27 NOTE — PMR Pre-admission (Signed)
PMR Admission Coordinator Pre-Admission Assessment  Patient: Rachel Fox is an 73 y.o., female MRN: [id number] DOB: [birth year] Height: '5\' 6"'$  (167.6 cm) Weight: 72.8 kg  Insurance Information HMO:     PPO:      PCP:      IPA:      80/20:      OTHER: no HMO PRIMARY: Medicare a and b      Policy#: [id number]      Subscriber: pt Benefits:  Phone #: passport one online     Name: 09/23/2018 Eff. Date: a 04/17/2012 b 04/17/2013     Deduct: $1408      Out of Pocket Max: none      Life Max: none CIR: 100%      SNF: 20 full days Outpatient: 80%     Co-Pay: 20% Home Health: 100%      Co-Pay: none DME: 80%     Co-Pay: 20% Providers: pt choice  SECONDARY: [employer]      Policy#: [id number]      Subscriber: pt  Medicaid Application Date:       Case Manager:  Disability Application Date:       Case Worker:   The "Data Collection Information Summary" for patients in Inpatient Rehabilitation Facilities with attached "Privacy Act [place] Records" was provided and verbally reviewed with: Patient  Emergency Contact Information Contact Information    Name Relation Home Work [place] Son [phone]  [phone]   Elson Clan Niece   [phone]      Current Medical History  Patient Admitting Diagnosis:  CVA  History of Present Illness:Weldon is a 73 year old right-handed female with history of hyperlipidemia, hypertension, type 2 diabetes mellitus.  .  Presented 09/20/2018 after being found down for an extended time.  Denied loss of consciousness.  Found to have left-sided weakness.  Blood pressure 212/171, UDS negative, WBC 14,000, sodium 146, BUN 31, creatinine 1.04, total CK 2651.  EKG atrial fibrillation with RVR and cardiology services consulted.  Patient was placed on intravenous Cardizem and dosed with digoxin and amiodarone also added.  Cranial CT scan showed moderately large subacute right MCA infarction.  No hemorrhage.  CT cervical spine negative.   Echocardiogram with ejection fraction of 65% hyperdynamic systolic function.  Carotid Dopplers with no ICA stenosis.WOC consulted for left buttock wound stage II pressure injury.  Presently maintained on Eliquis for CVA prophylaxis as well as atrial fibrillation.  Cardizem and amiodarone as well as digoxin transition to p.o. with cardiac rate controlled.  CK improved 358.  Dysphasia #1 nectar thick liquid.   Complete NIHSS TOTAL: 12  Patient's medical record from [facility] has been reviewed by the rehabilitation admission coordinator and physician.  Past Medical History  Past Medical History:  Diagnosis Date  . High cholesterol   . Hypertension   . Type 2 diabetes mellitus (HCC)     Family History   family history includes Heart attack in her father and sister; Stroke in her mother and sister.  Prior Rehab/Hospitalizations Has the patient had prior rehab or hospitalizations prior to admission? Yes  Has the patient had major surgery during 100 days prior to admission? No   Current Medications  Current Facility-Administered Medications:  .   stroke: mapping our early stages of recovery book, , Does not apply, Once, Reubin Milan, MD .  acetaminophen (TYLENOL) tablet 650 mg, 650 mg, Oral, Q4H PRN, 650 mg at 09/24/18 0500 **OR** acetaminophen (TYLENOL) solution 650  mg, 650 mg, Per Tube, Q4H PRN **OR** acetaminophen (TYLENOL) suppository 650 mg, 650 mg, Rectal, Q4H PRN, Reubin Milan, MD .  alum & mag hydroxide-simeth (MAALOX/MYLANTA) 200-200-20 MG/5ML suspension 30 mL, 30 mL, Oral, Q4H PRN, Amin, Ankit Chirag, MD .  amiodarone (PACERONE) tablet 200 mg, 200 mg, Oral, BID, Jerline Pain, MD, 200 mg at 09/27/18 1117 .  apixaban (ELIQUIS) tablet 5 mg, 5 mg, Oral, BID, Amin, Ankit Chirag, MD, 5 mg at 09/27/18 1117 .  atorvastatin (LIPITOR) tablet 80 mg, 80 mg, Oral, q1800, Jerline Pain, MD, 80 mg at 09/26/18 1801 .  collagenase (SANTYL) ointment, , Topical, Daily, Amin,  Ankit Chirag, MD .  digoxin (LANOXIN) tablet 0.125 mg, 0.125 mg, Oral, Daily, Skains, Mark C, MD, 0.125 mg at 09/27/18 1118 .  diltiazem (CARDIZEM) tablet 90 mg, 90 mg, Oral, QID, Amin, Ankit Chirag, MD, 90 mg at 09/27/18 1117 .  guaiFENesin-dextromethorphan (ROBITUSSIN DM) 100-10 MG/5ML syrup 5 mL, 5 mL, Oral, Q4H PRN, Amin, Ankit Chirag, MD .  hydrocortisone (ANUSOL-HC) 2.5 % rectal cream 1 application, 1 application, Topical, QID PRN, Amin, Ankit Chirag, MD .  hydrocortisone cream 1 % 1 application, 1 application, Topical, TID PRN, Amin, Ankit Chirag, MD .  insulin aspart (novoLOG) injection 0-9 Units, 0-9 Units, Subcutaneous, TID WC, Reubin Milan, MD, 1 Units at 09/25/18 [id number] .  ipratropium-albuterol (DUONEB) 0.5-2.5 (3) MG/3ML nebulizer solution 3 mL, 3 mL, Nebulization, Q4H PRN, Amin, Ankit Chirag, MD .  lip balm (CARMEX) ointment 1 application, 1 application, Topical, PRN, Amin, Ankit Chirag, MD .  loratadine (CLARITIN) tablet 10 mg, 10 mg, Oral, Daily PRN, Amin, Ankit Chirag, MD .  metoprolol tartrate (LOPRESSOR) injection 5 mg, 5 mg, Intravenous, Q4H PRN, Amin, Ankit Chirag, MD, 5 mg at 09/23/18 0835 .  Muscle Rub CREA 1 application, 1 application, Topical, PRN, Amin, Ankit Chirag, MD .  phenol (CHLORASEPTIC) mouth spray 1 spray, 1 spray, Mouth/Throat, PRN, Amin, Ankit Chirag, MD .  polyethylene glycol (MIRALAX / GLYCOLAX) packet 17 g, 17 g, Oral, Daily PRN, Amin, Ankit Chirag, MD .  polyvinyl alcohol (LIQUIFILM TEARS) 1.4 % ophthalmic solution 1 drop, 1 drop, Both Eyes, PRN, Amin, Ankit Chirag, MD .  prochlorperazine (COMPAZINE) injection 5 mg, 5 mg, Intravenous, Q4H PRN, Reubin Milan, MD .  Resource ThickenUp Clear, , Oral, PRN, Amin, Ankit Chirag, MD .  senna-docusate (Senokot-S) tablet 2 tablet, 2 tablet, Oral, QHS PRN, Amin, Ankit Chirag, MD .  sodium chloride (OCEAN) 0.65 % nasal spray 1 spray, 1 spray, Each Nare, PRN, Amin, Ankit Chirag, MD  Patients Current Diet:   Diet Order            Diet - low sodium heart healthy        DIET - DYS 1 Room service appropriate? Yes with Assist; Fluid consistency: Nectar Thick  Diet effective now              Precautions / Restrictions Precautions Precautions: Fall Precaution Comments: L side weak/flaccid, right gaze preference, L shoulder pain (risk for subluxation), pressure injury from laying on floor PTA Restrictions Weight Bearing Restrictions: No   Has the patient had 2 or more falls or a fall with injury in the past year? No  Prior Activity Level Community (5-7x/wk): independent; driving; walks 2 to 3 miles per day  Prior Functional Level Self Care: Did the patient need help bathing, dressing, using the toilet or eating? Independent  Indoor Mobility: Did the patient need assistance with  walking from room to room (with or without device)? Independent  Stairs: Did the patient need assistance with internal or external stairs (with or without device)? Independent  Functional Cognition: Did the patient need help planning regular tasks such as shopping or remembering to take medications? Independent  Home Assistive Devices / Equipment Home Assistive Devices/Equipment: None Home Equipment: None  Prior Device Use: Indicate devices/aids used by the patient prior to current illness, exacerbation or injury? None of the above  Current Functional Level Cognition  Arousal/Alertness: Awake/alert Overall Cognitive Status: Impaired/Different from baseline Current Attention Level: Sustained Orientation Level: Oriented X4 Following Commands: Follows one step commands consistently(on R side) Safety/Judgement: Decreased awareness of safety, Decreased awareness of deficits General Comments: Pt can tell me that her right side is her strong side, L is weak and when I give her the space to fall over in sitting she can tell me she is falling to the left and can help self correct with repetition.  Attention:  Sustained Sustained Attention: Impaired Sustained Attention Impairment: Verbal complex, Functional complex Memory: Impaired Memory Impairment: Decreased recall of new information Awareness: Impaired Problem Solving: Impaired Executive Function: Self Monitoring, Sequencing Sequencing: Impaired Self Monitoring: Impaired Safety/Judgment: Impaired    Extremity Assessment (includes Sensation/Coordination)  Upper Extremity Assessment: LUE deficits/detail LUE Deficits / Details: grossly 1/5 throughout, trace movement noted but not on command, decreased sensation LUE Sensation: decreased light touch LUE Coordination: decreased fine motor, decreased gross motor  Lower Extremity Assessment: Defer to PT evaluation RLE Deficits / Details: Effortful to engage in AROM activity in bed LLE Deficits / Details: No active movement noted. Pt unable to wiggle toes, perform ankle AROM, or initiate any muscle activation in quads. Pt reports equal sensation in R and L LE's.     ADLs  Overall ADL's : Needs assistance/impaired Eating/Feeding: Set up, Sitting Eating/Feeding Details (indicate cue type and reason): full supervision for sipping nectar thick liquids today, cues for small sips Grooming: Min guard, Sitting, Cueing for safety, Cueing for sequencing Upper Body Bathing: Moderate assistance, Bed level Upper Body Dressing : Moderate assistance, Maximal assistance, Bed level Functional mobility during ADLs: Minimal assistance, Rolling walker, Cueing for safety, Cueing for sequencing(using stedy) General ADL Comments: Pt HR reached 125 BPM with activity. Pt returned to 110s at rest.    Mobility  Overal bed mobility: Needs Assistance Bed Mobility: Rolling, Sidelying to Sit Rolling: Mod assist Sidelying to sit: Mod assist, +2 for physical assistance, HOB elevated Supine to sit: HOB elevated Sit to supine: Mod assist, +2 for physical assistance General bed mobility comments: assist to bring bilat from  EOB and to elevate trunk into sitting; cues for sequencing and use of rail    Transfers  Overall transfer level: Needs assistance Transfer via Lift Equipment: Stedy Transfers: Sit to/from Stand Sit to Stand: Mod assist, +2 physical assistance General transfer comment: pt stood X 3 trials utilizing Stedy standing frame; pt stood for RN to assess pressure wound on buttocks; L UE supported by therapist    Ambulation / Gait / Stairs / Wheelchair Mobility  Ambulation/Gait General Gait Details: unable at this time.     Posture / Balance Dynamic Sitting Balance Sitting balance - Comments: pt progressing to min guard assist with cues for correcting posture to midline  Balance Overall balance assessment: Needs assistance Sitting-balance support: Feet supported, No upper extremity supported, Single extremity supported Sitting balance-Leahy Scale: Poor Sitting balance - Comments: pt progressing to min guard assist with cues for correcting posture to  midline  Postural control: Left lateral lean Standing balance support: Single extremity supported Standing balance-Leahy Scale: Poor Standing balance comment: +2 required    Special needs/care consideration BiPAP/CPAP  N/a CPM  N/a Continuous Drip IV  N/a Dialysis n/a Life Vest  N/a Oxygen  N/a Special Bed  N/a Trach Size  N/a Wound Vac n/a Skin   Tenna Child, RN  Registered Nurse  WOC  Consult Note  Signed  Date of Service:  09/24/2018 3:19 PM          Signed         Show:Clear all '[x]'$ Manual'[x]'$ Template'[]'$ Copied  Added by: '[x]'$ Tenna Child, RN  '[]'$ Hover for details Eleanor Nurse wound consult note Reason for Consult: Consult requested for left buttock wound.  This was noted as a stage 2 pressure injury, present on admission but has declined to unstageable upon assessment today.  Pt fell at home and was found down for an unknown period of time prior to admission and appearance and location of this pressure injury are consistent  with a pressure injury which occurred related to the fall. Wound type: Unstageable pressure injury to left buttock Pressure Injury POA: Yes Measurement: 7X7cm Wound bed: 10% red, 90% black eschar, tightly adhered, with loose peeling skin surrounding Drainage (amount, consistency, odor) no odor, drainage, or fluctance Periwound: intact skin surrounding Dressing procedure/placement/frequency: Santyl ointment to provide enzymatic debridement of nonviable tissue.  Please re-consult if further assistance is needed.  Gae Dry MSN, RN, CWOCN, [place], CNS [phone]            Bowel mgmt:  Continent LBM 7/12 Bladder mgmt:  External catheter Diabetic mgmt:  Hgb A1c 6.1 Behavioral consideration  N/a Chemo/radiation  N/a   Previous Home Environment  Living Arrangements: Alone  Lives With: Alone Available Help at Discharge: Family, Available 24 hours/day(son, Fatima Sanger and cousin , Alden Benjamin to provide 24/7 assist) Type of Home: House Home Layout: One level Alternate Level Stairs-Number of Steps: 1 to the bedroom Home Access: Other (comment)(side entrance 1 step) Entrance Stairs-Rails: None(in the back where pt typically goes in. Rail in the front) Technical brewer of Steps: 1 step via side entrance Bathroom Shower/Tub: Tub/shower unit, Architectural technologist: Standard Bathroom Accessibility: Yes How Accessible: Accessible via walker [place]: No  Discharge Living Setting Plans for Discharge Living Setting: Patient's home(Jerry and cousin, Alden Benjamin, to provide 24/7 care in pt's home) Type of Home at Discharge: House Discharge Home Layout: One level Discharge Home Access: Stairs to enter Entrance Stairs-Rails: None Entrance Stairs-Number of Steps: side entrance 1 step Discharge Bathroom Shower/Tub: Tub/shower unit, Curtain Discharge Bathroom Toilet: Standard Discharge Bathroom Accessibility: Yes How Accessible: Accessible via walker Does the patient have any  problems obtaining your medications?: No  Social/Family/Support Systems Patient Roles: Parent Contact Information: niece, Freada Bergeron in [place] is primary contact. She keeps pt son, Sonia Side in communication loop Anticipated Caregiver: son, Sonia Side and cousin, Alden Benjamin Anticipated Caregiver's Contact Information: Freada Bergeron [phone]/ Sh is in Medical administration Ability/Limitations of Caregiver: son works days; Alden Benjamin is a Chartered loss adjuster: 24/7 Discharge Plan Discussed with Primary Caregiver: Yes Is Caregiver In Agreement with Plan?: Yes Does Caregiver/Family have Issues with Lodging/Transportation while Pt is in Rehab?: No  Family and patient have requested that patient have female caregiver for all bathing and dressing as well as toileting when possible.  Goals/Additional Needs Patient/Family Goal for Rehab: min asisst with PT and OT, supervision SLP Expected length of stay: ELOS 14 to 20 days  Special Service Needs: Family and Patient request female caregivers for bathing and dressing. Also request females for toileting if possible.  Pt/Family Agrees to Admission and willing to participate: Yes Program Orientation Provided & Reviewed with Pt/Caregiver Including Roles  & Responsibilities: Yes  Decrease burden of Care through IP rehab admission: n/a  Possible need for SNF placement upon discharge:  Not anticipated  Patient Condition: I have reviewed medical records from [facility] , spoken with CM, and patient and family member. I met with patient at the bedside for inpatient rehabilitation assessment.  Patient will benefit from ongoing PT, OT and SLP, can actively participate in 3 hours of therapy a day 5 days of the week, and can make measurable gains during the admission.  Patient will also benefit from the coordinated team approach during an Inpatient Acute Rehabilitation admission.  The patient will receive intensive therapy as well as Rehabilitation physician, nursing,  social worker, and care management interventions.  Due to bladder management, bowel management, safety, skin/wound care, disease management, medication administration, pain management and patient education the patient requires 24 hour a day rehabilitation nursing.  The patient is currently mod to max assist with mobility and basic ADLs.  Discharge setting and therapy post discharge at home with home health is anticipated.  Patient has agreed to participate in the Acute Inpatient Rehabilitation Program and will admit today.  Preadmission Screen Completed By:  Cleatrice Burke RN MSN, 09/27/2018 1:03 PM ______________________________________________________________________   Discussed status with Dr. Naaman Plummer  on  09/27/2018 at  1310 and received approval for admission today.  Admission Coordinator:  Itzelle, Gains, RN, time  3212 Date  09/27/2018   Assessment/Plan: Diagnosis: right MCA infarct 1. Does the need for close, 24 hr/day Medical supervision in concert with the patient's rehab needs make it unreasonable for this patient to be served in a less intensive setting? Yes 2. Co-Morbidities requiring supervision/potential complications: HTN, DM 3. Due to bladder management, bowel management, safety, skin/wound care, disease management, medication administration, pain management and patient education, does the patient require 24 hr/day rehab nursing? Yes 4. Does the patient require coordinated care of a physician, rehab nurse, PT (1-2 hrs/day, 5 days/week), OT (1-2 hrs/day, 5 days/week) and SLP (1-2 hrs/day, 5 days/week) to address physical and functional deficits in the context of the above medical diagnosis(es)? Yes Addressing deficits in the following areas: balance, endurance, locomotion, strength, transferring, bowel/bladder control, bathing, dressing, feeding, grooming, toileting, cognition, speech and psychosocial support 5. Can the patient actively participate in an intensive therapy  program of at least 3 hrs of therapy 5 days a week? Yes 6. The potential for patient to make measurable gains while on inpatient rehab is excellent 7. Anticipated functional outcomes upon discharge from inpatients are: min assist PT, min assist and mod assist OT, modified independent and supervision SLP 8. Estimated rehab length of stay to reach the above functional goals is: 21-24 days 9. Anticipated D/C setting: Home 10. Anticipated post D/C treatments: HH therapy and Outpatient therapy 11. Overall Rehab/Functional Prognosis: excellent  MD Signature: Meredith Staggers, MD, [facility] 09/27/2018

## 2018-09-27 NOTE — Progress Notes (Signed)
Rachel Staggers, MD  Physician  Physical Medicine and Rehabilitation  PMR Pre-admission  Signed  Date of Service:  09/27/2018 1:03 PM      Related encounter: ED to Hosp-Admission (Discharged) from 09/20/2018 in [facility]      Signed         Show:Clear all _0 Manual_1 Template_2 Copied  Added by: _3 Cristina Gong, RN_4 Rachel Staggers, MD  _5 Hover for details PMR Admission Coordinator Pre-Admission Assessment  Patient: Rachel Fox is an 73 y.o., female MRN: [id number] DOB: 1945-05-28 Height: _6  (167.6 cm) Weight: 72.8 kg  Insurance Information HMO:     PPO:      PCP:      IPA:      80/20:      OTHER: no HMO PRIMARY: Medicare a and b      Policy#: [id number]      Subscriber: pt Benefits:  Phone #: passport one online     Name: 09/23/2018 Eff. Date: a 04/17/2012 b 04/17/2013     Deduct: $1408      Out of Pocket Max: none      Life Max: none CIR: 100%      SNF: 20 full days Outpatient: 80%     Co-Pay: 20% Home Health: 100%      Co-Pay: none DME: 80%     Co-Pay: 20% Providers: pt choice  SECONDARY: [employer]      Policy#: [id number]      Subscriber: pt  Medicaid Application Date:       Case Manager:  Disability Application Date:       Case Worker:   The "Data Collection Information Summary" for patients in Inpatient Rehabilitation Facilities with attached "Privacy Act [place] Records" was provided and verbally reviewed with: Patient  Emergency Contact Information         Contact Information    Name Relation Home Work Morris Son [phone]  [phone]   Elson Clan Niece   [phone]      Current Medical History  Patient Admitting Diagnosis:  CVA  History of Present Illness:Hicklin is a 73 year old right-handed female with history of hyperlipidemia, hypertension, type 2 diabetes mellitus. . Presented 09/20/2018 after being found down for an extended time. Denied loss of  consciousness. Found to have left-sided weakness. Blood pressure 212/171, UDS negative, WBC 14,000, sodium 146, BUN 31, creatinine 1.04, total CK 2651. EKG atrial fibrillation with RVR and cardiology services consulted. Patient was placed on intravenous Cardizem and dosed with digoxin and amiodarone also added. Cranial CT scan showed moderately large subacute right MCA infarction. No hemorrhage. CT cervical spine negative. Echocardiogram with ejection fraction of 65% hyperdynamic systolic function. Carotid Dopplers with no ICA stenosis.WOCconsulted for left buttock wound stage II pressure injury. Presently maintained on Eliquis for CVA prophylaxis as well as atrial fibrillation. Cardizem and amiodarone as well as digoxin transition to p.o. with cardiac rate controlled. CK improved 358. Dysphasia #1 nectar thick liquid.   Complete NIHSS TOTAL: 12  Patient's medical record from [facility] has been reviewed by the rehabilitation admission coordinator and physician.  Past Medical History      Past Medical History:  Diagnosis Date  . High cholesterol   . Hypertension   . Type 2 diabetes mellitus (HCC)     Family History   family history includes Heart attack in her father and sister; Stroke in her mother and sister.  Prior Rehab/Hospitalizations Has the patient had prior rehab or hospitalizations  prior to admission? Yes  Has the patient had major surgery during 100 days prior to admission? No              Current Medications  Current Facility-Administered Medications:  .   stroke: mapping our early stages of recovery book, , Does not apply, Once, Reubin Milan, MD .  acetaminophen (TYLENOL) tablet 650 mg, 650 mg, Oral, Q4H PRN, 650 mg at 09/24/18 0500 **OR** acetaminophen (TYLENOL) solution 650 mg, 650 mg, Per Tube, Q4H PRN **OR** acetaminophen (TYLENOL) suppository 650 mg, 650 mg, Rectal, Q4H PRN, Reubin Milan, MD .  alum & mag hydroxide-simeth  (MAALOX/MYLANTA) 200-200-20 MG/5ML suspension 30 mL, 30 mL, Oral, Q4H PRN, Amin, Ankit Chirag, MD .  amiodarone (PACERONE) tablet 200 mg, 200 mg, Oral, BID, Jerline Pain, MD, 200 mg at 09/27/18 1117 .  apixaban (ELIQUIS) tablet 5 mg, 5 mg, Oral, BID, Amin, Ankit Chirag, MD, 5 mg at 09/27/18 1117 .  atorvastatin (LIPITOR) tablet 80 mg, 80 mg, Oral, q1800, Jerline Pain, MD, 80 mg at 09/26/18 1801 .  collagenase (SANTYL) ointment, , Topical, Daily, Amin, Ankit Chirag, MD .  digoxin (LANOXIN) tablet 0.125 mg, 0.125 mg, Oral, Daily, Skains, Mark C, MD, 0.125 mg at 09/27/18 1118 .  diltiazem (CARDIZEM) tablet 90 mg, 90 mg, Oral, QID, Amin, Ankit Chirag, MD, 90 mg at 09/27/18 1117 .  guaiFENesin-dextromethorphan (ROBITUSSIN DM) 100-10 MG/5ML syrup 5 mL, 5 mL, Oral, Q4H PRN, Amin, Ankit Chirag, MD .  hydrocortisone (ANUSOL-HC) 2.5 % rectal cream 1 application, 1 application, Topical, QID PRN, Amin, Ankit Chirag, MD .  hydrocortisone cream 1 % 1 application, 1 application, Topical, TID PRN, Amin, Ankit Chirag, MD .  insulin aspart (novoLOG) injection 0-9 Units, 0-9 Units, Subcutaneous, TID WC, Reubin Milan, MD, 1 Units at 09/25/18 [phone] .  ipratropium-albuterol (DUONEB) 0.5-2.5 (3) MG/3ML nebulizer solution 3 mL, 3 mL, Nebulization, Q4H PRN, Amin, Ankit Chirag, MD .  lip balm (CARMEX) ointment 1 application, 1 application, Topical, PRN, Amin, Ankit Chirag, MD .  loratadine (CLARITIN) tablet 10 mg, 10 mg, Oral, Daily PRN, Amin, Ankit Chirag, MD .  metoprolol tartrate (LOPRESSOR) injection 5 mg, 5 mg, Intravenous, Q4H PRN, Amin, Ankit Chirag, MD, 5 mg at 09/23/18 0835 .  Muscle Rub CREA 1 application, 1 application, Topical, PRN, Amin, Ankit Chirag, MD .  phenol (CHLORASEPTIC) mouth spray 1 spray, 1 spray, Mouth/Throat, PRN, Amin, Ankit Chirag, MD .  polyethylene glycol (MIRALAX / GLYCOLAX) packet 17 g, 17 g, Oral, Daily PRN, Amin, Ankit Chirag, MD .  polyvinyl alcohol (LIQUIFILM TEARS) 1.4 %  ophthalmic solution 1 drop, 1 drop, Both Eyes, PRN, Amin, Ankit Chirag, MD .  prochlorperazine (COMPAZINE) injection 5 mg, 5 mg, Intravenous, Q4H PRN, Reubin Milan, MD .  Resource ThickenUp Clear, , Oral, PRN, Amin, Ankit Chirag, MD .  senna-docusate (Senokot-S) tablet 2 tablet, 2 tablet, Oral, QHS PRN, Amin, Ankit Chirag, MD .  sodium chloride (OCEAN) 0.65 % nasal spray 1 spray, 1 spray, Each Nare, PRN, Amin, Ankit Chirag, MD  Patients Current Diet:     Diet Order                  Diet - low sodium heart healthy         DIET - DYS 1 Room service appropriate? Yes with Assist; Fluid consistency: Nectar Thick  Diet effective now               Precautions / Restrictions Precautions  Precautions: Fall Precaution Comments: L side weak/flaccid, right gaze preference, L shoulder pain (risk for subluxation), pressure injury from laying on floor PTA Restrictions Weight Bearing Restrictions: No   Has the patient had 2 or more falls or a fall with injury in the past year? No  Prior Activity Level Community (5-7x/wk): independent; driving; walks 2 to 3 miles per day  Prior Functional Level Self Care: Did the patient need help bathing, dressing, using the toilet or eating? Independent  Indoor Mobility: Did the patient need assistance with walking from room to room (with or without device)? Independent  Stairs: Did the patient need assistance with internal or external stairs (with or without device)? Independent  Functional Cognition: Did the patient need help planning regular tasks such as shopping or remembering to take medications? Independent  Home Assistive Devices / Equipment Home Assistive Devices/Equipment: None Home Equipment: None  Prior Device Use: Indicate devices/aids used by the patient prior to current illness, exacerbation or injury? None of the above  Current Functional Level Cognition  Arousal/Alertness: Awake/alert Overall Cognitive  Status: Impaired/Different from baseline Current Attention Level: Sustained Orientation Level: Oriented X4 Following Commands: Follows one step commands consistently(on R side) Safety/Judgement: Decreased awareness of safety, Decreased awareness of deficits General Comments: Pt can tell me that her right side is her strong side, L is weak and when I give her the space to fall over in sitting she can tell me she is falling to the left and can help self correct with repetition.  Attention: Sustained Sustained Attention: Impaired Sustained Attention Impairment: Verbal complex, Functional complex Memory: Impaired Memory Impairment: Decreased recall of new information Awareness: Impaired Problem Solving: Impaired Executive Function: Self Monitoring, Sequencing Sequencing: Impaired Self Monitoring: Impaired Safety/Judgment: Impaired    Extremity Assessment (includes Sensation/Coordination)  Upper Extremity Assessment: LUE deficits/detail LUE Deficits / Details: grossly 1/5 throughout, trace movement noted but not on command, decreased sensation LUE Sensation: decreased light touch LUE Coordination: decreased fine motor, decreased gross motor  Lower Extremity Assessment: Defer to PT evaluation RLE Deficits / Details: Effortful to engage in AROM activity in bed LLE Deficits / Details: No active movement noted. Pt unable to wiggle toes, perform ankle AROM, or initiate any muscle activation in quads. Pt reports equal sensation in R and L LE's.     ADLs  Overall ADL's : Needs assistance/impaired Eating/Feeding: Set up, Sitting Eating/Feeding Details (indicate cue type and reason): full supervision for sipping nectar thick liquids today, cues for small sips Grooming: Min guard, Sitting, Cueing for safety, Cueing for sequencing Upper Body Bathing: Moderate assistance, Bed level Upper Body Dressing : Moderate assistance, Maximal assistance, Bed level Functional mobility during ADLs: Minimal  assistance, Rolling walker, Cueing for safety, Cueing for sequencing(using stedy) General ADL Comments: Pt HR reached 125 BPM with activity. Pt returned to 110s at rest.    Mobility  Overal bed mobility: Needs Assistance Bed Mobility: Rolling, Sidelying to Sit Rolling: Mod assist Sidelying to sit: Mod assist, +2 for physical assistance, HOB elevated Supine to sit: HOB elevated Sit to supine: Mod assist, +2 for physical assistance General bed mobility comments: assist to bring bilat from EOB and to elevate trunk into sitting; cues for sequencing and use of rail    Transfers  Overall transfer level: Needs assistance Transfer via Lift Equipment: Stedy Transfers: Sit to/from Stand Sit to Stand: Mod assist, +2 physical assistance General transfer comment: pt stood X 3 trials utilizing Stedy standing frame; pt stood for RN to assess pressure wound on  buttocks; L UE supported by therapist    Ambulation / Gait / Stairs / Wheelchair Mobility  Ambulation/Gait General Gait Details: unable at this time.     Posture / Balance Dynamic Sitting Balance Sitting balance - Comments: pt progressing to min guard assist with cues for correcting posture to midline  Balance Overall balance assessment: Needs assistance Sitting-balance support: Feet supported, No upper extremity supported, Single extremity supported Sitting balance-Leahy Scale: Poor Sitting balance - Comments: pt progressing to min guard assist with cues for correcting posture to midline  Postural control: Left lateral lean Standing balance support: Single extremity supported Standing balance-Leahy Scale: Poor Standing balance comment: +2 required    Special needs/care consideration BiPAP/CPAP  N/a CPM  N/a Continuous Drip IV  N/a Dialysis n/a Life Vest  N/a Oxygen  N/a Special Bed  N/a Trach Size  N/a Wound Vac n/a Skin      Tenna Child, RN  Registered Nurse  WOC  Consult Note   Signed   Date of Service:   09/24/2018 3:19 PM            Signed         Show:Clear all _0 ?Manual_1 ?Template_2 ?Copied  Added by: _3 ?Tenna Child, RN  _4 ?Hover for details [place] Nurse wound consult note Reason for Consult:Consult requested for left buttock wound. This was noted as a stage 2 pressure injury, present on admission but has declined to unstageable upon assessment today. Pt fell at home and was found down for an unknown period of time prior to admission and appearance and location of this pressure injury are consistent with a pressure injury which occurred related to the fall. Wound type:Unstageable pressure injury to left buttock Pressure Injury POA: Yes Measurement:7X7cm Wound bed:10% red, 90% black eschar, tightly adhered, with loose peeling skin surrounding Drainage (amount, consistency, odor)no odor, drainage, or fluctance Periwound:intact skin surrounding Dressing procedure/placement/frequency:Santyl ointment to provide enzymatic debridement of nonviable tissue. Please re-consult if further assistance is needed. Gae Dry MSN, RN, CWOCN, [place], CNS [phone]            Bowel mgmt:  Continent LBM 7/12 Bladder mgmt:  External catheter Diabetic mgmt:  Hgb A1c 6.1 Behavioral consideration  N/a Chemo/radiation  N/a   Previous Home Environment  Living Arrangements: Alone  Lives With: Alone Available Help at Discharge: Family, Available 24 hours/day(son, Fatima Sanger and cousin , Alden Benjamin to provide 24/7 assist) Type of Home: House Home Layout: One level Alternate Level Stairs-Number of Steps: 1 to the bedroom Home Access: Other (comment)(side entrance 1 step) Entrance Stairs-Rails: None(in the back where pt typically goes in. Rail in the front) Technical brewer of Steps: 1 step via side entrance Bathroom Shower/Tub: Tub/shower unit, Architectural technologist: Standard Bathroom Accessibility: Yes How Accessible: Accessible via walker [place]: No  Discharge Living Setting Plans for Discharge Living Setting: Patient's home(Jerry and cousin, Alden Benjamin, to provide 24/7 care in pt's home) Type of Home at Discharge: House Discharge Home Layout: One level Discharge Home Access: Stairs to enter Entrance Stairs-Rails: None Entrance Stairs-Number of Steps: side entrance 1 step Discharge Bathroom Shower/Tub: Tub/shower unit, Curtain Discharge Bathroom Toilet: Standard Discharge Bathroom Accessibility: Yes How Accessible: Accessible via walker Does the patient have any problems obtaining your medications?: No  Social/Family/Support Systems Patient Roles: Parent Contact Information: niece, Freada Bergeron in [place] is primary contact. She keeps pt son, Sonia Side in communication loop Anticipated Caregiver: son, Sonia Side and cousin, Alden Benjamin Anticipated Caregiver's Contact Information: Freada Bergeron [phone]/ Sh is in  Medical administration Ability/Limitations of Caregiver: son works days; Alden Benjamin is a Chartered loss adjuster: 24/7 Discharge Plan Discussed with Primary Caregiver: Yes Is Caregiver In Agreement with Plan?: Yes Does Caregiver/Family have Issues with Lodging/Transportation while Pt is in Rehab?: No  Family and patient have requested that patient have female caregiver for all bathing and dressing as well as toileting when possible.  Goals/Additional Needs Patient/Family Goal for Rehab: min asisst with PT and OT, supervision SLP Expected length of stay: ELOS 14 to 20 days Special Service Needs: Family and Patient request female caregivers for bathing and dressing. Also request females for toileting if possible.  Pt/Family Agrees to Admission and willing to participate: Yes Program Orientation Provided & Reviewed with Pt/Caregiver Including Roles  & Responsibilities: Yes  Decrease burden of Care through IP rehab admission: n/a  Possible need for SNF placement upon discharge:  Not anticipated  Patient Condition: I have reviewed  medical records from [facility] , spoken with CM, and patient and family member. I met with patient at the bedside for inpatient rehabilitation assessment.  Patient will benefit from ongoing PT, OT and SLP, can actively participate in 3 hours of therapy a day 5 days of the week, and can make measurable gains during the admission.  Patient will also benefit from the coordinated team approach during an Inpatient Acute Rehabilitation admission.  The patient will receive intensive therapy as well as Rehabilitation physician, nursing, social worker, and care management interventions.  Due to bladder management, bowel management, safety, skin/wound care, disease management, medication administration, pain management and patient education the patient requires 24 hour a day rehabilitation nursing.  The patient is currently mod to max assist with mobility and basic ADLs.  Discharge setting and therapy post discharge at home with home health is anticipated.  Patient has agreed to participate in the Acute Inpatient Rehabilitation Program and will admit today.  Preadmission Screen Completed By:  Cleatrice Burke RN MSN, 09/27/2018 1:03 PM ______________________________________________________________________   Discussed status with Dr. Naaman Plummer  on  09/27/2018 at  1310 and received approval for admission today.  Admission Coordinator:  Pinkie, Manger, RN, time  7034 Date  09/27/2018   Assessment/Plan: Diagnosis: right MCA infarct 1. Does the need for close, 24 hr/day Medical supervision in concert with the patient's rehab needs make it unreasonable for this patient to be served in a less intensive setting? Yes 2. Co-Morbidities requiring supervision/potential complications: HTN, DM 3. Due to bladder management, bowel management, safety, skin/wound care, disease management, medication administration, pain management and patient education, does the patient require 24 hr/day rehab nursing? Yes  4. Does the patient require coordinated care of a physician, rehab nurse, PT (1-2 hrs/day, 5 days/week), OT (1-2 hrs/day, 5 days/week) and SLP (1-2 hrs/day, 5 days/week) to address physical and functional deficits in the context of the above medical diagnosis(es)? Yes Addressing deficits in the following areas: balance, endurance, locomotion, strength, transferring, bowel/bladder control, bathing, dressing, feeding, grooming, toileting, cognition, speech and psychosocial support 5. Can the patient actively participate in an intensive therapy program of at least 3 hrs of therapy 5 days a week? Yes 6. The potential for patient to make measurable gains while on inpatient rehab is excellent 7. Anticipated functional outcomes upon discharge from inpatients are: min assist PT, min assist and mod assist OT, modified independent and supervision SLP 8. Estimated rehab length of stay to reach the above functional goals is: 21-24 days 9. Anticipated D/C setting: Home 10. Anticipated post D/C treatments:  HH therapy and Outpatient therapy 11. Overall Rehab/Functional Prognosis: excellent  MD Signature: Rachel Staggers, MD, [facility] 09/27/2018         Revision History

## 2018-09-27 NOTE — H&P (Signed)
Physical Medicine and Rehabilitation Admission H&P    Chief Complaint  Patient presents with   Weakness  : HPI: Rachel Fox is a 73 year old right-handed female with history of hyperlipidemia, hypertension, type 2 diabetes mellitus.  Per chart review patient lives alone independent prior to admission still driving.  2 level home with 3 steps to entry.  Presented 09/20/2018 after being found down for an extended time.  Denied loss of consciousness.  Found to have left-sided weakness.  Blood pressure 212/171, UDS negative, WBC 14,000, sodium 146, BUN 31, creatinine 1.04, total CK 2651.  EKG atrial fibrillation with RVR and cardiology services consulted.  Patient was placed on intravenous Cardizem and dosed with digoxin and amiodarone also added.  Cranial CT scan showed moderately large subacute right MCA infarction.  No hemorrhage.  CT cervical spine negative.  Echocardiogram with ejection fraction of 65% hyperdynamic systolic function.  Carotid Dopplers with no ICA stenosis.WOC consulted for left buttock wound stage II pressure injury.  Presently maintained on Eliquis for CVA prophylaxis as well as atrial fibrillation.  Cardizem and amiodarone as well as digoxin transition to p.o. with cardiac rate controlled.  CK improved 358.  Dysphasia #1 nectar thick liquid.  Therapy evaluation completed and patient was admitted for a comprehensive rehab program.  Review of Systems  Constitutional: Negative for chills and fever.  HENT: Negative for hearing loss.   Eyes: Negative for blurred vision and double vision.  Respiratory: Negative for cough and shortness of breath.   Cardiovascular: Positive for palpitations. Negative for chest pain and leg swelling.  Gastrointestinal: Positive for constipation. Negative for heartburn and nausea.  Genitourinary: Negative for dysuria, flank pain and hematuria.  Musculoskeletal: Positive for myalgias.  Neurological: Positive for weakness.  All other systems  reviewed and are negative.  Past Medical History:  Diagnosis Date   High cholesterol    Hypertension    Type 2 diabetes mellitus (HCC)    Past Surgical History:  Procedure Laterality Date   ABDOMINAL HYSTERECTOMY     TUBAL LIGATION     Family History  Problem Relation Age of Onset   Stroke Mother    Heart attack Father    Stroke Sister    Heart attack Sister    Social History:  reports that she has quit smoking. She has never used smokeless tobacco. She reports that she does not drink alcohol or use drugs. Allergies: No Known Allergies Medications Prior to Admission  Medication Sig Dispense Refill   amLODipine (NORVASC) 10 MG tablet Take 1 tablet by mouth daily.     metFORMIN (GLUCOPHAGE) 500 MG tablet Take 1 tablet by mouth 2 (two) times a day.     metoprolol-hydrochlorothiazide (LOPRESSOR HCT) 50-25 MG tablet Take 1 tablet by mouth 2 (two) times a day.     telmisartan (MICARDIS) 80 MG tablet Take 1 tablet by mouth daily.      Drug Regimen Review Drug regimen was reviewed and remains appropriate with no significant issues identified  Home: Home Living Family/patient expects to be discharged to:: Private residence Living Arrangements: Alone Available Help at Discharge: Family, Available PRN/intermittently Type of Home: House Home Access: Stairs to enter Entergy CorporationEntrance Stairs-Number of Steps: 8 in the front, 3 in the back Entrance Stairs-Rails: None(in the back where pt typically goes in. Rail in the front) Home Layout: Two level Alternate Level Stairs-Number of Steps: 1 to the bedroom Bathroom Shower/Tub: Engineer, manufacturing systemsTub/shower unit Bathroom Toilet: Standard Home Equipment: None  Lives With: Alone  Functional History: Prior Function Level of Independence: Independent Comments: performing iADL including driving  Functional Status:  Mobility: Bed Mobility Overal bed mobility: Needs Assistance Bed Mobility: Rolling, Sidelying to Sit Rolling: Mod assist Sidelying  to sit: Mod assist, +2 for physical assistance, HOB elevated Supine to sit: HOB elevated Sit to supine: Mod assist, +2 for physical assistance General bed mobility comments: assist to bring bilat from EOB and to elevate trunk into sitting; cues for sequencing and use of rail Transfers Overall transfer level: Needs assistance Transfer via Lift Equipment: Stedy Transfers: Sit to/from Stand Sit to Stand: Mod assist, +2 physical assistance General transfer comment: pt stood X 3 trials utilizing Stedy standing frame; pt stood for RN to assess pressure wound on buttocks; L UE supported by therapist Ambulation/Gait General Gait Details: unable at this time.     ADL: ADL Overall ADL's : Needs assistance/impaired Eating/Feeding: Set up, Sitting Eating/Feeding Details (indicate cue type and reason): full supervision for sipping nectar thick liquids today, cues for small sips Grooming: Min guard, Sitting, Cueing for safety, Cueing for sequencing Upper Body Bathing: Moderate assistance, Bed level Upper Body Dressing : Moderate assistance, Maximal assistance, Bed level Functional mobility during ADLs: Minimal assistance, Rolling walker, Cueing for safety, Cueing for sequencing(using stedy) General ADL Comments: Pt HR reached 125 BPM with activity. Pt returned to 110s at rest.  Cognition: Cognition Overall Cognitive Status: Impaired/Different from baseline Arousal/Alertness: Awake/alert Orientation Level: Oriented X4 Attention: Sustained Sustained Attention: Impaired Sustained Attention Impairment: Verbal complex, Functional complex Memory: Impaired Memory Impairment: Decreased recall of new information Awareness: Impaired Problem Solving: Impaired Executive Function: Self Monitoring, Sequencing Sequencing: Impaired Self Monitoring: Impaired Safety/Judgment: Impaired Cognition Arousal/Alertness: Awake/alert Behavior During Therapy: WFL for tasks assessed/performed Overall Cognitive  Status: Impaired/Different from baseline Area of Impairment: Attention, Following commands, Safety/judgement, Awareness, Problem solving Current Attention Level: Sustained Following Commands: Follows one step commands consistently(on R side) Safety/Judgement: Decreased awareness of safety, Decreased awareness of deficits Awareness: Emergent Problem Solving: Difficulty sequencing, Requires verbal cues, Requires tactile cues General Comments: Pt can tell me that her right side is her strong side, L is weak and when I give her the space to fall over in sitting she can tell me she is falling to the left and can help self correct with repetition.   Physical Exam: Blood pressure (!) 115/94, pulse 87, temperature 98 F (36.7 C), temperature source Oral, resp. rate 18, height 5\' 6"  (1.676 m), weight 72.8 kg, SpO2 100 %. Physical Exam  Constitutional: No distress.  obese  HENT:  Head: Normocephalic and atraumatic.  Eyes: Pupils are equal, round, and reactive to light. EOM are normal.  Neck: No tracheal deviation present. No thyromegaly present.  Cardiovascular: Normal rate and regular rhythm. Exam reveals no friction rub.  No murmur heard. Respiratory: Effort normal and breath sounds normal. No respiratory distress. She has no wheezes. She has no rales.  GI: Soft. She exhibits no distension. There is no abdominal tenderness. There is no rebound.  Musculoskeletal:        General: No deformity or edema.     Comments: Left shoulder tender with passive IR, ER, abduction  Neurological:  Alert and oriented to name, place, reason she's here, month, year, day of week. recalls biographical information. Left central 7 and tongue deviation. Left HH and inattention. LUE and LLE 0/5. Decreased LT to left arm and leg but senses gross touch and deep pain stim. RUE and RLE 4/5. No resting tone, DTR's 1+  Skin: Skin  is warm and dry. She is not diaphoretic.  Left buttock wound dressed.   Psychiatric:  A little  flat but overall pleasant.     Results for orders placed or performed during the hospital encounter of 09/20/18 (from the past 48 hour(s))  Glucose, capillary     Status: None   Collection Time: 09/25/18 12:28 PM  Result Value Ref Range   Glucose-Capillary 96 70 - 99 mg/dL   Comment 1 Notify RN    Comment 2 Document in Chart   Glucose, capillary     Status: Abnormal   Collection Time: 09/25/18  4:39 PM  Result Value Ref Range   Glucose-Capillary 105 (H) 70 - 99 mg/dL   Comment 1 Notify RN    Comment 2 Document in Chart   Glucose, capillary     Status: Abnormal   Collection Time: 09/25/18  9:52 PM  Result Value Ref Range   Glucose-Capillary 120 (H) 70 - 99 mg/dL   Comment 1 Notify RN    Comment 2 Document in Chart   Basic metabolic panel     Status: Abnormal   Collection Time: 09/26/18  5:12 AM  Result Value Ref Range   Sodium 140 135 - 145 mmol/L   Potassium 3.7 3.5 - 5.1 mmol/L   Chloride 104 98 - 111 mmol/L   CO2 26 22 - 32 mmol/L   Glucose, Bld 122 (H) 70 - 99 mg/dL   BUN 9 8 - 23 mg/dL   Creatinine, Ser 1.610.88 0.44 - 1.00 mg/dL   Calcium 9.3 8.9 - 09.610.3 mg/dL   GFR calc non Af Amer >60 >60 mL/min   GFR calc Af Amer >60 >60 mL/min   Anion gap 10 5 - 15    Comment: Performed at [facility], [address] [place], [place] [address]  CBC     Status: None   Collection Time: 09/26/18  5:12 AM  Result Value Ref Range   WBC 9.6 4.0 - 10.5 K/uL   RBC 4.93 3.87 - 5.11 MIL/uL   Hemoglobin 13.6 12.0 - 15.0 g/dL   HCT 98.142.0 19.136.0 - 47.846.0 %   MCV 85.2 80.0 - 100.0 fL   MCH 27.6 26.0 - 34.0 pg   MCHC 32.4 30.0 - 36.0 g/dL   RDW 29.513.6 62.111.5 - 30.815.5 %   Platelets 227 150 - 400 K/uL   nRBC 0.0 0.0 - 0.2 %    Comment: Performed at [facility], [address] [place], [place] [address]  Magnesium     Status: None   Collection Time: 09/26/18  5:12 AM  Result Value Ref Range   Magnesium 2.0 1.7 - 2.4 mg/dL    Comment: Performed at [facility], [address]  [place], [place] [address]  Glucose, capillary     Status: Abnormal   Collection Time: 09/26/18  6:08 AM  Result Value Ref Range   Glucose-Capillary 118 (H) 70 - 99 mg/dL  Glucose, capillary     Status: Abnormal   Collection Time: 09/26/18 12:50 PM  Result Value Ref Range   Glucose-Capillary 111 (H) 70 - 99 mg/dL   Comment 1 Notify RN    Comment 2 Document in Chart   Glucose, capillary     Status: Abnormal   Collection Time: 09/26/18 10:16 PM  Result Value Ref Range   Glucose-Capillary 134 (H) 70 - 99 mg/dL   Comment 1 Notify RN    Comment 2 Document in Chart  No results found.     Medical Problem List and Plan: 1.  Left hemiparesis, dysphagia,  and visual-spatial deficits secondary to right MCA infarction  -admit to inpatient rehab 2.  Antithrombotics: -DVT/anticoagulation:   Eliquis on board  -antiplatelet therapy: N/A 3. Pain Management: Tylenol as needed  -add muscle rub for left shoulder  -maintain proper shoulder position and support when in bed and chair. 4. Mood: Provide emotional support  -antipsychotic agents: N/A 5. Neuropsych: This patient is capable of making decisions on her own behalf. 6. Skin/Wound Care: Routine skin checks.  Santyl left buttock daily covered with moist gauze and foam dressing. 7. Fluids/Electrolytes/Nutrition: Routine in and outs with follow-up chemistries on admit 8.  Atrial fibrillation.  Amiodarone 200 mg twice daily, Lanoxin 0.125 mg daily, Cardizem 90 mg 4 times daily.  Cardiac rate controlled.  Follow cardiology services  -HR controlled at present 9.  Dysphagia.  Dysphagia #1 nectar liquid.  Follow-up speech therapy  -encourage fluids 10.  Diabetes mellitus.  Hemoglobin A1c 6.1.  SSI.  Patient on Glucophage 500 mg twice daily prior to admission.  Resume as indicated 11.  Rhabdomyolysis.  Improved to 358. 12.  Hyperlipidemia.  Lipitor 13.  Constipation.  Laxative assistance      Cathlyn Parsons, PA-C 09/27/2018

## 2018-09-27 NOTE — Care Management Important Message (Signed)
Important Message  Patient Details  Name: Rachel Fox MRN: [id number] Date of Birth: May 09, 1945   Medicare Important Message Given:  Yes     Orbie Pyo 09/27/2018, 4:19 PM

## 2018-09-27 NOTE — H&P (Signed)
Physical Medicine and Rehabilitation Admission H&P        Chief Complaint  Patient presents with  . Weakness  : HPI: Rachel Fox is a 73 year old right-handed female with history of hyperlipidemia, hypertension, type 2 diabetes mellitus.  Per chart review patient lives alone independent prior to admission still driving.  2 level home with 3 steps to entry.  Presented 09/20/2018 after being found down for an extended time.  Denied loss of consciousness.  Found to have left-sided weakness.  Blood pressure 212/171, UDS negative, WBC 14,000, sodium 146, BUN 31, creatinine 1.04, total CK 2651.  EKG atrial fibrillation with RVR and cardiology services consulted.  Patient was placed on intravenous Cardizem and dosed with digoxin and amiodarone also added.  Cranial CT scan showed moderately large subacute right MCA infarction.  No hemorrhage.  CT cervical spine negative.  Echocardiogram with ejection fraction of 65% hyperdynamic systolic function.  Carotid Dopplers with no ICA stenosis.WOC consulted for left buttock wound stage II pressure injury.  Presently maintained on Eliquis for CVA prophylaxis as well as atrial fibrillation.  Cardizem and amiodarone as well as digoxin transition to p.o. with cardiac rate controlled.  CK improved 358.  Dysphasia #1 nectar thick liquid.  Therapy evaluation completed and patient was admitted for a comprehensive rehab program.   Review of Systems  Constitutional: Negative for chills and fever.  HENT: Negative for hearing loss.   Eyes: Negative for blurred vision and double vision.  Respiratory: Negative for cough and shortness of breath.   Cardiovascular: Positive for palpitations. Negative for chest pain and leg swelling.  Gastrointestinal: Positive for constipation. Negative for heartburn and nausea.  Genitourinary: Negative for dysuria, flank pain and hematuria.  Musculoskeletal: Positive for myalgias.  Neurological: Positive for weakness.  All other  systems reviewed and are negative.       Past Medical History:  Diagnosis Date  . High cholesterol    . Hypertension    . Type 2 diabetes mellitus (HCC)          Past Surgical History:  Procedure Laterality Date  . ABDOMINAL HYSTERECTOMY      . TUBAL LIGATION            Family History  Problem Relation Age of Onset  . Stroke Mother    . Heart attack Father    . Stroke Sister    . Heart attack Sister     Social History:  reports that she has quit smoking. She has never used smokeless tobacco. She reports that she does not drink alcohol or use drugs. Allergies: No Known Allergies Medications Prior to Admission  Medication Sig Dispense Refill  . amLODipine (NORVASC) 10 MG tablet Take 1 tablet by mouth daily.      . metFORMIN (GLUCOPHAGE) 500 MG tablet Take 1 tablet by mouth 2 (two) times a day.      . metoprolol-hydrochlorothiazide (LOPRESSOR HCT) 50-25 MG tablet Take 1 tablet by mouth 2 (two) times a day.      . telmisartan (MICARDIS) 80 MG tablet Take 1 tablet by mouth daily.         Drug Regimen Review Drug regimen was reviewed and remains appropriate with no significant issues identified   Home: Home Living Family/patient expects to be discharged to:: Private residence Living Arrangements: Alone Available Help at Discharge: Family, Available PRN/intermittently Type of Home: House Home Access: Stairs to enter Entergy CorporationEntrance Stairs-Number of Steps: 8 in the front, 3 in the back  Entrance Stairs-Rails: None(in the back where pt typically goes in. Rail in the front) Home Layout: Two level Alternate Level Stairs-Number of Steps: 1 to the bedroom Bathroom Shower/Tub: Engineer, manufacturing systems: Standard Home Equipment: None  Lives With: Alone   Functional History: Prior Function Level of Independence: Independent Comments: performing iADL including driving   Functional Status:  Mobility: Bed Mobility Overal bed mobility: Needs Assistance Bed Mobility: Rolling,  Sidelying to Sit Rolling: Mod assist Sidelying to sit: Mod assist, +2 for physical assistance, HOB elevated Supine to sit: HOB elevated Sit to supine: Mod assist, +2 for physical assistance General bed mobility comments: assist to bring bilat from EOB and to elevate trunk into sitting; cues for sequencing and use of rail Transfers Overall transfer level: Needs assistance Transfer via Lift Equipment: Stedy Transfers: Sit to/from Stand Sit to Stand: Mod assist, +2 physical assistance General transfer comment: pt stood X 3 trials utilizing Stedy standing frame; pt stood for RN to assess pressure wound on buttocks; L UE supported by therapist Ambulation/Gait General Gait Details: unable at this time.      ADL: ADL Overall ADL's : Needs assistance/impaired Eating/Feeding: Set up, Sitting Eating/Feeding Details (indicate cue type and reason): full supervision for sipping nectar thick liquids today, cues for small sips Grooming: Min guard, Sitting, Cueing for safety, Cueing for sequencing Upper Body Bathing: Moderate assistance, Bed level Upper Body Dressing : Moderate assistance, Maximal assistance, Bed level Functional mobility during ADLs: Minimal assistance, Rolling walker, Cueing for safety, Cueing for sequencing(using stedy) General ADL Comments: Pt HR reached 125 BPM with activity. Pt returned to 110s at rest.   Cognition: Cognition Overall Cognitive Status: Impaired/Different from baseline Arousal/Alertness: Awake/alert Orientation Level: Oriented X4 Attention: Sustained Sustained Attention: Impaired Sustained Attention Impairment: Verbal complex, Functional complex Memory: Impaired Memory Impairment: Decreased recall of new information Awareness: Impaired Problem Solving: Impaired Executive Function: Self Monitoring, Sequencing Sequencing: Impaired Self Monitoring: Impaired Safety/Judgment: Impaired Cognition Arousal/Alertness: Awake/alert Behavior During Therapy: WFL  for tasks assessed/performed Overall Cognitive Status: Impaired/Different from baseline Area of Impairment: Attention, Following commands, Safety/judgement, Awareness, Problem solving Current Attention Level: Sustained Following Commands: Follows one step commands consistently(on R side) Safety/Judgement: Decreased awareness of safety, Decreased awareness of deficits Awareness: Emergent Problem Solving: Difficulty sequencing, Requires verbal cues, Requires tactile cues General Comments: Pt can tell me that her right side is her strong side, L is weak and when I give her the space to fall over in sitting she can tell me she is falling to the left and can help self correct with repetition.    Physical Exam: Blood pressure (!) 115/94, pulse 87, temperature 98 F (36.7 C), temperature source Oral, resp. rate 18, height  (1.676 m), weight 72.8 kg, SpO2 100 %. Physical Exam  Constitutional: No distress.  obese  HENT:  Head: Normocephalic and atraumatic.  Eyes: Pupils are equal, round, and reactive to light. EOM are normal.  Neck: No tracheal deviation present. No thyromegaly present.  Cardiovascular: Normal rate and regular rhythm. Exam reveals no friction rub.  No murmur heard. Respiratory: Effort normal and breath sounds normal. No respiratory distress. She has no wheezes. She has no rales.  GI: Soft. She exhibits no distension. There is no abdominal tenderness. There is no rebound.  Musculoskeletal:        General: No deformity or edema.     Comments: Left shoulder tender with passive IR, ER, abduction  Neurological:  Alert and oriented to name, place, reason she's here, month, year,  day of week. recalls biographical information. Left central 7 and tongue deviation. Left HH and inattention. LUE and LLE 0/5. Decreased LT to left arm and leg but senses gross touch and deep pain stim. RUE and RLE 4/5. No resting tone, DTR's 1+  Skin: Skin is warm and dry. She is not diaphoretic.  Left  buttock wound dressed.   Psychiatric:  A little flat but overall pleasant.       Lab Results Last 48 Hours        Results for orders placed or performed during the hospital encounter of 09/20/18 (from the past 48 hour(s))  Glucose, capillary     Status: None    Collection Time: 09/25/18 12:28 PM  Result Value Ref Range    Glucose-Capillary 96 70 - 99 mg/dL    Comment 1 Notify RN      Comment 2 Document in Chart    Glucose, capillary     Status: Abnormal    Collection Time: 09/25/18  4:39 PM  Result Value Ref Range    Glucose-Capillary 105 (H) 70 - 99 mg/dL    Comment 1 Notify RN      Comment 2 Document in Chart    Glucose, capillary     Status: Abnormal    Collection Time: 09/25/18  9:52 PM  Result Value Ref Range    Glucose-Capillary 120 (H) 70 - 99 mg/dL    Comment 1 Notify RN      Comment 2 Document in Chart    Basic metabolic panel     Status: Abnormal    Collection Time: 09/26/18  5:12 AM  Result Value Ref Range    Sodium 140 135 - 145 mmol/L    Potassium 3.7 3.5 - 5.1 mmol/L    Chloride 104 98 - 111 mmol/L    CO2 26 22 - 32 mmol/L    Glucose, Bld 122 (H) 70 - 99 mg/dL    BUN 9 8 - 23 mg/dL    Creatinine, Ser 1.020.88 0.44 - 1.00 mg/dL    Calcium 9.3 8.9 - 72.510.3 mg/dL    GFR calc non Af Amer >60 >60 mL/min    GFR calc Af Amer >60 >60 mL/min    Anion gap 10 5 - 15      Comment: Performed at [facility], [address] [place], [place] [address]  CBC     Status: None    Collection Time: 09/26/18  5:12 AM  Result Value Ref Range    WBC 9.6 4.0 - 10.5 K/uL    RBC 4.93 3.87 - 5.11 MIL/uL    Hemoglobin 13.6 12.0 - 15.0 g/dL    HCT 03.442.0 74.236.0 - 59.546.0 %    MCV 85.2 80.0 - 100.0 fL    MCH 27.6 26.0 - 34.0 pg    MCHC 32.4 30.0 - 36.0 g/dL    RDW 63.813.6 75.611.5 - 43.315.5 %    Platelets 227 150 - 400 K/uL    nRBC 0.0 0.0 - 0.2 %      Comment: Performed at [facility], [address] [place], [place] [address]  Magnesium     Status: None    Collection Time: 09/26/18   5:12 AM  Result Value Ref Range    Magnesium 2.0 1.7 - 2.4 mg/dL      Comment: Performed at [facility], [address] [place], [place] [address]  Glucose, capillary     Status: Abnormal  Collection Time: 09/26/18  6:08 AM  Result Value Ref Range    Glucose-Capillary 118 (H) 70 - 99 mg/dL  Glucose, capillary     Status: Abnormal    Collection Time: 09/26/18 12:50 PM  Result Value Ref Range    Glucose-Capillary 111 (H) 70 - 99 mg/dL    Comment 1 Notify RN      Comment 2 Document in Chart    Glucose, capillary     Status: Abnormal    Collection Time: 09/26/18 10:16 PM  Result Value Ref Range    Glucose-Capillary 134 (H) 70 - 99 mg/dL    Comment 1 Notify RN      Comment 2 Document in Chart       Imaging Results (Last 48 hours)  No results found.           Medical Problem List and Plan: 1.  Left hemiparesis, dysphagia,  and visual-spatial deficits secondary to right MCA infarction             -admit to inpatient rehab 2.  Antithrombotics: -DVT/anticoagulation:   Eliquis on board             -antiplatelet therapy: N/A 3. Pain Management: Tylenol as needed             -add muscle rub for left shoulder             -maintain proper shoulder position and support when in bed and chair. 4. Mood: Provide emotional support             -antipsychotic agents: N/A 5. Neuropsych: This patient is capable of making decisions on her own behalf. 6. Skin/Wound Care: Routine skin checks.  Santyl left buttock daily covered with moist gauze and foam dressing. 7. Fluids/Electrolytes/Nutrition: Routine in and outs with follow-up chemistries on admit 8.  Atrial fibrillation.  Amiodarone 200 mg twice daily, Lanoxin 0.125 mg daily, Cardizem 90 mg 4 times daily.  Cardiac rate controlled.  Follow cardiology services             -HR controlled at present 9.  Dysphagia.  Dysphagia #1 nectar liquid.  Follow-up speech therapy             -encourage fluids 10.  Diabetes mellitus.  Hemoglobin A1c  6.1.  SSI.  Patient on Glucophage 500 mg twice daily prior to admission.  Resume as indicated 11.  Rhabdomyolysis.  Improved to 358. 12.  Hyperlipidemia.  Lipitor 13.  Constipation.  Laxative assistance   Post Admission Physician Evaluation: 1. Functional deficits secondary  to right mca infarct. 2. Patient is admitted to receive collaborative, interdisciplinary care between the physiatrist, rehab nursing staff, and therapy team. 3. Patient's level of medical complexity and substantial therapy needs in context of that medical necessity cannot be provided at a lesser intensity of care such as a SNF. 4. Patient has experienced substantial functional loss from his/her baseline which was documented above under the "Functional History" and "Functional Status" headings.  Judging by the patient's diagnosis, physical exam, and functional history, the patient has potential for functional progress which will result in measurable gains while on inpatient rehab.  These gains will be of substantial and practical use upon discharge  in facilitating mobility and self-care at the household level. 5. Physiatrist will provide 24 hour management of medical needs as well as oversight of the therapy plan/treatment and provide guidance as appropriate regarding the interaction of the two. 6. The Preadmission Screening has been reviewed and patient  status is unchanged unless otherwise stated above. 7. 24 hour rehab nursing will assist with bladder management, bowel management, safety, skin/wound care, disease management, medication administration, pain management and patient education  and help integrate therapy concepts, techniques,education, etc. 8. PT will assess and treat for/with: Lower extremity strength, range of motion, stamina, balance, functional mobility, safety, adaptive techniques and equipment, NMR, pain control, orthotics.   Goals are: min assist. 9. OT will assess and treat for/with: ADL's, functional  mobility, safety, upper extremity strength, adaptive techniques and equipment, NMR, family ed.   Goals are: min to mod assist. Therapy may proceed with showering this patient. 10. SLP will assess and treat for/with: speech, cognition ,communication, swallowing.  Goals are: mod I to supervision. 11. Case Management and Social Worker will assess and treat for psychological issues and discharge planning. 12. Team conference will be held weekly to assess progress toward goals and to determine barriers to discharge. 13. Patient will receive at least 3 hours of therapy per day at least 5 days per week. 14. ELOS: 21-24 days       15. Prognosis:  excellent   I have personally performed a face to face diagnostic evaluation of this patient and formulated the key components of the plan.  Additionally, I have personally reviewed laboratory data, imaging studies, as well as relevant notes and concur with the physician assistant's documentation above.  Ranelle OysterZachary T. Phiona Ramnauth, MD, FAAPMR       Mcarthur Rossettianiel J Angiulli, PA-C 09/27/2018

## 2018-09-27 NOTE — Progress Notes (Addendum)
Progress Note  Patient Name: Rachel PageBarbara C Bromwell Date of Encounter: 09/27/2018  Primary Cardiologist: Dina RichBranch, Jonathan, MD  Subjective   Denies any CP, SOB or palpitations. Having some incontinence issues.  Inpatient Medications    Scheduled Meds: .  stroke: mapping our early stages of recovery book   Does not apply Once  . amiodarone  200 mg Oral BID  . apixaban  5 mg Oral BID  . atorvastatin  80 mg Oral q1800  . collagenase   Topical Daily  . digoxin  0.125 mg Oral Daily  . diltiazem  90 mg Oral QID  . insulin aspart  0-9 Units Subcutaneous TID WC   Continuous Infusions:  PRN Meds: acetaminophen **OR** acetaminophen (TYLENOL) oral liquid 160 mg/5 mL **OR** acetaminophen, alum & mag hydroxide-simeth, guaiFENesin-dextromethorphan, hydrocortisone, hydrocortisone cream, ipratropium-albuterol, lip balm, loratadine, metoprolol tartrate, Muscle Rub, phenol, polyethylene glycol, polyvinyl alcohol, prochlorperazine, Resource ThickenUp Clear, senna-docusate, sodium chloride   Vital Signs    Vitals:   09/26/18 1942 09/26/18 2318 09/27/18 0355 09/27/18 0735  BP: 140/62 122/68 (!) 115/94 136/82  Pulse: 91 99 87 85  Resp: 20 18 18 18   Temp: 98.3 F (36.8 C) 98.2 F (36.8 C) 98 F (36.7 C) 98.1 F (36.7 C)  TempSrc: Oral Oral Oral Oral  SpO2: 100% 98% 100% 100%  Weight:      Height:        Intake/Output Summary (Last 24 hours) at 09/27/2018 0804 Last data filed at 09/26/2018 2334 Gross per 24 hour  Intake 480 ml  Output 1150 ml  Net -670 ml   Last 3 Weights 09/20/2018 09/20/2018  Weight (lbs) 160 lb 7.9 oz 156 lb  Weight (kg) 72.8 kg 70.761 kg     Telemetry    Atrial fib rates 70s-90s - Personally Reviewed  Physical Exam   GEN: No acute distress.  HEENT: Normocephalic, atraumatic, sclera non-icteric. Neck: No JVD or bruits. Cardiac: Irregularly irregular, rate controlled, no murmurs, rubs, or gallops.  Radials/DP/PT 1+ and equal bilaterally.  Respiratory: Clear to  auscultation bilaterally. Breathing is unlabored. GI: Soft, nontender, non-distended, BS +x 4. MS: no deformity. Extremities: No clubbing or cyanosis. No edema. Distal pedal pulses are 2+ and equal bilaterally. Neuro:  AAOx3. Follows commands. L facial droop Psych:  Responds to questions appropriately with a normal affect.  Labs    Chemistry Recent Labs  Lab 09/20/18 1142  09/24/18 0359 09/25/18 0458 09/26/18 0512 09/27/18 0520  NA 146*   < > 143 141 140  --   K 3.8   < > 4.7 4.3 3.7  --   CL 107   < > 110 109 104  --   CO2 23   < > 27 21* 26  --   GLUCOSE 132*   < > 105* 118* 122*  --   BUN 31*   < > 8 8 9   --   CREATININE 1.04*   < > 0.75 0.70 0.88 0.86  CALCIUM 10.4*   < > 8.7* 9.0 9.3  --   PROT 8.5*  --   --   --   --   --   ALBUMIN 4.2  --   --   --   --   --   AST 63*  --   --   --   --   --   ALT 30  --   --   --   --   --   ALKPHOS 61  --   --   --   --   --  BILITOT 1.3*  --   --   --   --   --   GFRNONAA 54*   < > >60 >60 >60 >60  GFRAA >60   < > >60 >60 >60 >60  ANIONGAP 16*   < > 6 11 10   --    < > = values in this interval not displayed.     Hematology Recent Labs  Lab 09/24/18 0359 09/25/18 0458 09/26/18 0512  WBC 9.7 9.2 9.6  RBC 4.64 4.83 4.93  HGB 12.6 13.1 13.6  HCT 40.1 40.6 42.0  MCV 86.4 84.1 85.2  MCH 27.2 27.1 27.6  MCHC 31.4 32.3 32.4  RDW 13.7 13.4 13.6  PLT 188 196 227   BNP Recent Labs  Lab 09/20/18 1142  BNP 485.0*    Radiology    No results found.  Cardiac Studies   2d echo 09/21/18 IMPRESSIONS    1. The left ventricle has hyperdynamic systolic function, with an ejection fraction of >65%. The cavity size was normal. There is mildly increased left ventricular wall thickness. Left ventricular diastolic function could not be evaluated secondary to  atrial fibrillation.  2. The right ventricle has normal systolic function. The cavity was normal. There is no increase in right ventricular wall thickness.  3. Trivial  pericardial effusion is present.  4. No evidence of mitral valve stenosis.  5. No stenosis of the aortic valve.  6. The interatrial septum was not assessed.   Patient Profile     73 y.o. female with DM, HTN, HLD admitted with fall and weakness, found to be in new onset atrial fib and with CVA + rhabdo.   Assessment & Plan    1. Acute stroke - per neuro/IM. Now on Eliquis.  2. Atrial fibrillation with RVR, new diagnosis - rate control initially challenging due to soft BP, so was on digoxin with amio added. BP has since come up so diltiazem was added - needs to be short acting since unable to take long acting pill as meds need to be crushed. Consider discontinuation of either digoxin or amiodarone, given potential for drug interaction. TSH wnl. LFTs mildly abnormal on admission in setting of rhabdo -repeat LFTs in AM. Neuro's last note 7/9 indicated to start Eliquis in 5-7 days but appears to have been pulled forward from initial recs - spoke with Dr. Nelson ChimesAmin who confirmed he spoke with Dr. Pearlean BrownieSethi about timing. Can consider DCCV down the road after recovery from stroke (LA normal size).  3. Mild rhabdomyolysis - per primary team.  3. Demand ischemia - mildly elevated hstroponin in setting of tachycardia. Not felt to represent ACS.  4. HTN - controlled.   5. HLD - cleared by medical team to start statin. If the patient is tolerating statin at time of follow-up appointment, would consider rechecking liver function/lipid panel in 6-8 weeks.  For questions or updates, please contact [facility] Please consult [email or web address] for contact info under Cardiology/STEMI.  Signed, Laurann Montanaayna N Dunn, PA-C 09/27/2018, 8:04 AM    ---------------------------------------------------------------------------------------------   History and all data above reviewed.  Patient examined.  I agree with the findings as above.  Rachel Fox is stable, no CP.   Constitutional: No acute distress Eyes: pupils  equally round and reactive to light, sclera non-icteric, normal conjunctiva and lids ENMT: normal dentition, moist mucous membranes Cardiovascular: irregular rhythm, normal rate, no murmurs. S1 and S2 normal. Radial pulses normal bilaterally. No jugular venous distention.  Respiratory: clear to auscultation bilaterally GI :  normal bowel sounds, soft and nontender. No distention.   MSK: extremities warm, well perfused. No edema.  NEURO: grossly nonfocal exam, moves all extremities. PSYCH: alert and oriented x 3, normal mood and affect.   All available labs, radiology testing, previous records reviewed. Agree with documented assessment and plan of my colleague as stated above with the following additions or changes:  Principal Problem:   Cerebrovascular accident (CVA) with involvement of left side of body ([place]) Active Problems:   Atrial fibrillation with RVR ([place])   Hypertension   Rhabdomyolysis   Type 2 diabetes mellitus ([place])   Hyperlipidemia   Hyperbilirubinemia   Leukocytosis   Hypernatremia   Pressure injury of skin    Plan: Agree with documentation as above. She is tolerating current therapy, while inpatient can continue digoxin, amiodarone and diltiazem short acting, however have a low threshold to discontinue digoxin, recommend checking levels in inpatient rehab.   F/u at discharge.   Time Spent Directly with Patient:  I have spent a total of 25 minutes with the patient reviewing hospital notes, telemetry, EKGs, labs and examining the patient as well as establishing an assessment and plan that was discussed personally with the patient.  > 50% of time was spent in direct patient care.  Length of Stay:  LOS: 7 days   Elouise Munroe, MD [facility] 3:38 PM  09/27/2018

## 2018-09-28 ENCOUNTER — Inpatient Hospital Stay (HOSPITAL_COMMUNITY): Payer: Medicare Other | Admitting: Speech Pathology

## 2018-09-28 ENCOUNTER — Inpatient Hospital Stay (HOSPITAL_COMMUNITY): Payer: Medicare Other | Admitting: Occupational Therapy

## 2018-09-28 ENCOUNTER — Inpatient Hospital Stay (HOSPITAL_COMMUNITY): Payer: Medicare Other

## 2018-09-28 DIAGNOSIS — R269 Unspecified abnormalities of gait and mobility: Secondary | ICD-10-CM

## 2018-09-28 DIAGNOSIS — I639 Cerebral infarction, unspecified: Secondary | ICD-10-CM

## 2018-09-28 DIAGNOSIS — G8114 Spastic hemiplegia affecting left nondominant side: Secondary | ICD-10-CM

## 2018-09-28 DIAGNOSIS — I69398 Other sequelae of cerebral infarction: Secondary | ICD-10-CM

## 2018-09-28 DIAGNOSIS — R209 Unspecified disturbances of skin sensation: Secondary | ICD-10-CM

## 2018-09-28 LAB — GLUCOSE, CAPILLARY
Glucose-Capillary: 120 mg/dL — ABNORMAL HIGH (ref 70–99)
Glucose-Capillary: 97 mg/dL (ref 70–99)
Glucose-Capillary: 129 mg/dL — ABNORMAL HIGH (ref 70–99)
Glucose-Capillary: 106 mg/dL — ABNORMAL HIGH (ref 70–99)

## 2018-09-28 LAB — CBC WITH DIFFERENTIAL/PLATELET
Abs Immature Granulocytes: 0.05 10*3/uL (ref 0.00–0.07)
Basophils Absolute: 0 10*3/uL (ref 0.0–0.1)
Basophils Relative: 0 %
Eosinophils Absolute: 0.1 10*3/uL (ref 0.0–0.5)
Eosinophils Relative: 1 %
HCT: 40.8 % (ref 36.0–46.0)
Hemoglobin: 13.1 g/dL (ref 12.0–15.0)
Immature Granulocytes: 0 %
Lymphocytes Relative: 13 %
Lymphs Abs: 1.8 10*3/uL (ref 0.7–4.0)
MCH: 27.3 pg (ref 26.0–34.0)
MCHC: 32.1 g/dL (ref 30.0–36.0)
MCV: 85.2 fL (ref 80.0–100.0)
Monocytes Absolute: 1.5 10*3/uL — ABNORMAL HIGH (ref 0.1–1.0)
Monocytes Relative: 11 %
Neutro Abs: 10 10*3/uL — ABNORMAL HIGH (ref 1.7–7.7)
Neutrophils Relative %: 75 %
Platelets: 232 10*3/uL (ref 150–400)
RBC: 4.79 MIL/uL (ref 3.87–5.11)
RDW: 13.7 % (ref 11.5–15.5)
WBC: 13.5 10*3/uL — ABNORMAL HIGH (ref 4.0–10.5)
nRBC: 0 % (ref 0.0–0.2)

## 2018-09-28 LAB — COMPREHENSIVE METABOLIC PANEL
ALT: 39 U/L (ref 0–44)
AST: 26 U/L (ref 15–41)
Albumin: 2.6 g/dL — ABNORMAL LOW (ref 3.5–5.0)
Alkaline Phosphatase: 48 U/L (ref 38–126)
Anion gap: 9 (ref 5–15)
BUN: 12 mg/dL (ref 8–23)
CO2: 27 mmol/L (ref 22–32)
Calcium: 9.3 mg/dL (ref 8.9–10.3)
Chloride: 104 mmol/L (ref 98–111)
Creatinine, Ser: 0.92 mg/dL (ref 0.44–1.00)
GFR calc Af Amer: >60 mL/min (ref >60)
GFR calc non Af Amer: >60 mL/min (ref >60)
Glucose, Bld: 124 mg/dL — ABNORMAL HIGH (ref 70–99)
Potassium: 3.7 mmol/L (ref 3.5–5.1)
Sodium: 140 mmol/L (ref 135–145)
Total Bilirubin: 0.9 mg/dL (ref 0.3–1.2)
Total Protein: 6.1 g/dL — ABNORMAL LOW (ref 6.5–8.1)

## 2018-09-28 NOTE — Evaluation (Signed)
Physical Therapy Assessment and Plan  Patient Details  Name: Rachel Fox MRN: [id number] Date of Birth: 1945/06/12  PT Diagnosis: Abnormal posture, Difficulty walking, Hemiparesis non-dominant, Hypertonia, Impaired sensation, Muscle weakness and Pain in joint Rehab Potential: Good ELOS: 3-4 weeks   Today's Date: 09/28/2018 PT Individual Time: 1101-1201 PT Individual Time Calculation (min): 60 min    Problem List:  Patient Active Problem List   Diagnosis Date Noted  . Right middle cerebral artery stroke ([place]) 09/27/2018  . Pressure injury of skin 09/21/2018  . Cerebrovascular accident (CVA) with involvement of left side of body ([place]) 09/20/2018  . Atrial fibrillation with RVR ([place]) 09/20/2018  . Hypertension   . Rhabdomyolysis   . Type 2 diabetes mellitus ([place])   . Hyperlipidemia   . Hyperbilirubinemia   . Leukocytosis   . Hypernatremia     Past Medical History:  Past Medical History:  Diagnosis Date  . High cholesterol   . Hypertension   . Type 2 diabetes mellitus ([place])    Past Surgical History:  Past Surgical History:  Procedure Laterality Date  . ABDOMINAL HYSTERECTOMY    . TUBAL LIGATION      Assessment & Plan Clinical Impression: Patient is a 73 y.o. year old female with history of hyperlipidemia, hypertension, type 2 diabetes mellitus. Per chart review patient lives alone independent prior to admission still driving. 2 level home with 3 steps to entry. Presented 09/20/2018 after being found down for an extended time. Denied loss of consciousness. Found to have left-sided weakness. Blood pressure 212/171, UDS negative, WBC 14,000, sodium 146, BUN 31, creatinine 1.04, total CK 2651. EKG atrial fibrillation with RVR and cardiology services consulted. Patient was placed on intravenous Cardizem and dosed with digoxin and amiodarone also added. Cranial CT scan showed moderately large subacute right MCA infarction. No hemorrhage. CT cervical spine negative.  Echocardiogram with ejection fraction of 65% hyperdynamic systolic function. Carotid Dopplers with no ICA stenosis.WOCconsulted for left buttock wound stage II pressure injury. Presently maintained on Eliquis for CVA prophylaxis as well as atrial fibrillation. Cardizem and amiodarone as well as digoxin transition to p.o. with cardiac rate controlled. CK improved 358. Dysphasia #1 nectar thick liquid. Therapy evaluation completed and patient was admitted for a comprehensive rehab program.  Patient transferred to CIR on 09/27/2018 .   Patient currently requires max with mobility secondary to muscle weakness, abnormal tone, decreased coordination and decreased motor planning and decreased standing balance, decreased postural control, hemiplegia and decreased balance strategies.  Prior to hospitalization, patient was independent  with mobility and lived with Alone in a House home.  Home access is 1 step via side entranceOther (comment).  Patient will benefit from skilled PT intervention to maximize safe functional mobility, minimize fall risk and decrease caregiver burden for planned discharge home with 24 hour assist.  Anticipate patient will benefit from follow up [facility] at discharge.  PT - End of Session Activity Tolerance: Tolerates 10 - 20 min activity with multiple rests Endurance Deficit: Yes PT Assessment Rehab Potential (ACUTE/IP ONLY): Good PT Patient demonstrates impairments in the following area(s): Balance;Edema;Endurance;Motor;Pain;Perception;Safety;Sensory PT Transfers Functional Problem(s): Bed Mobility;Bed to Chair;Car;Furniture;Floor PT Locomotion Functional Problem(s): Stairs;Wheelchair Mobility;Ambulation PT Plan PT Intensity: Minimum of 1-2 x/day ,45 to 90 minutes PT Frequency: 5 out of 7 days PT Duration Estimated Length of Stay: 3-4 weeks PT Treatment/Interventions: DME/adaptive equipment instruction;Neuromuscular re-education;Stair training;UE/LE Strength  taining/ROM;Balance/vestibular training;Functional electrical stimulation;Discharge planning;Pain management;Skin care/wound management;Therapeutic Activities;UE/LE Coordination activities;Disease management/prevention;Functional mobility training;Patient/family education;Splinting/orthotics;Therapeutic Exercise;Visual/perceptual remediation/compensation;Cognitive remediation/compensation;Psychosocial support;Community  reintegration;Ambulation/gait training;Wheelchair propulsion/positioning PT Transfers Anticipated Outcome(s): min assist PT Locomotion Anticipated Outcome(s): min assist PT Recommendation Recommendations for Other Services: Neuropsych consult Follow Up Recommendations: Home health PT Patient destination: Home Equipment Recommended: To be determined  Skilled Therapeutic Intervention Evaluation completed (see details above and below) with education on PT POC and goals and individual treatment initiated with focus on functional mobility, sitting balance, OOB activity and CVA education. Pt supine in bed upon PT arrival, agreeable to therapy tx and reports pain in L shoulder. Pt transferred to sitting EOB with mod assist, cues for techniques and sequencing. Pt performed squat pivot to the w/c with max assist, cues for techniques. Pt propelled w/c x 50 ft with cues for techniques and R hemi techniques. Pt performed squat pivot w/c<>mat this session with max assist and cues for techniques, head hip relationship, therapist assisting with trunk control during transfers. Pt worked on seated balance edge of mat with cues for midline orientation and attention to the L, pt able to correct with verbal/tactile cues, also worked on correcting posture with use of mirror for visual feedback. Pt reports feeling "woozy," therapist checked BP in sitting- 121/74 with HR 80 bpm. Pt performed x 1 sit<>stand from edge of mat with max assist and therapist blocking L knee, pt with strong lean to the L. Pt performed  another sit<>stand with max assist and mirror for visual feedback, cues for midline standing balance, max assist for standing balance. Pt reports having to use the bathroom, transported back to room and performed stedy transfer from w/c<>toilet dependently, cues to limit L lateral lean. Pt attempted to use bathroom however unable to void, total assist for clothing management. Transferred to bed and then to supine with max assist, left with needs in reach and bed alarm set.    PT Evaluation Precautions/Restrictions Precautions Precautions: Fall Precaution Comments: L side weak/flaccid, right gaze preference, L shoulder pain (risk for subluxation), pressure injury from laying on floor PTA Restrictions Weight Bearing Restrictions: No Pain Pain Assessment Pain Score: 0-No pain Home Living/Prior Functioning Home Living Available Help at Discharge: Family;Available 24 hours/day Type of Home: House Home Access: Other (comment) Entrance Stairs-Number of Steps: 1 step via side entrance Entrance Stairs-Rails: None Home Layout: One level Alternate Level Stairs-Number of Steps: 1 to the bedroom Bathroom Shower/Tub: Tub/shower unit;Curtain Bathroom Toilet: Standard Bathroom Accessibility: Yes  Lives With: Alone Prior Function Level of Independence: Independent with basic ADLs;Independent with homemaking with ambulation;Independent with transfers;Independent with gait  Able to Take Stairs?: Yes Driving: Yes Vocation: Retired Vision/Perception  Vision - Risk analyst: Within Passenger transport manager Range of Motion: Within Functional Limits Alignment/Gaze Preference: Within Defined Limits Tracking/Visual Pursuits: Able to track stimulus in all quads without difficulty Saccades: Within functional limits Additional Comments: R  gaze  preference  Cognition Overall Cognitive Status: Impaired/Different from baseline Arousal/Alertness: Awake/alert Orientation Level: Oriented  X4 Attention: Sustained;Focused Focused Attention: Appears intact Sustained Attention: Appears intact Awareness: Impaired Safety/Judgment: Impaired Sensation Sensation Light Touch: Impaired Detail Central sensation comments: impaired L LE, burning in   the L heel and numbness in the toes Light Touch Impaired Details: Impaired LLE;Impaired LUE Proprioception: Impaired by gross assessment Coordination Gross Motor Movements are Fluid and Coordinated: No Fine Motor Movements are Fluid and Coordinated: No Coordination and Movement Description: coodination impaired secondary to sensory deficits and L hemiparesis Motor  Motor Motor: Hemiplegia;Abnormal tone Motor - Skilled Clinical Observations: L hemiparesis, hypertonia L hamstrings  Mobility Bed Mobility Bed Mobility: Rolling Left;Sit to Supine;Supine  to Sit Rolling Left: Minimal Assistance - Patient > 75% Supine to Sit: Moderate Assistance - Patient 50-74% Sit to Supine: Moderate Assistance - Patient 50-74% Transfers Transfers: Pharmacist, hospital Pivot Transfers: Maximal Assistance - Patient 25-49% Transfer (Assistive device): None Locomotion  Gait Ambulation: No Gait Gait: No Stairs / Additional Locomotion Stairs: No Wheelchair Mobility Wheelchair Mobility: Yes Wheelchair Assistance: Minimal assistance - Patient >75% Wheelchair Propulsion: Right upper extremity;Right lower extremity Wheelchair Parts Management: Needs assistance Distance: 50 ft  Trunk/Postural Assessment  Cervical Assessment Cervical Assessment: Within Functional Limits Thoracic Assessment Thoracic Assessment: Exceptions to WFL(rounded shoulders) Lumbar Assessment Lumbar Assessment: Exceptions to WFL(posterior pelvic tilt in sitting) Postural Control Postural Control: Deficits on evaluation Trunk Control: L lateral lean Righting Reactions: impaired  Balance Balance Balance Assessed: Yes Static Sitting Balance Static Sitting - Level of  Assistance: 4: Min assist;5: Stand by assistance Dynamic Sitting Balance Dynamic Sitting - Level of Assistance: 4: Min assist Static Standing Balance Static Standing - Level of Assistance: 2: Max assist Dynamic Standing Balance Dynamic Standing - Level of Assistance: 2: Max assist;1: +1 Total assist Extremity Assessment  RLE Assessment RLE Assessment: Within Functional Limits LLE Assessment LLE Assessment: Exceptions to [facility] Passive Range of Motion (PROM) Comments: tight hamstrings and heel cords, DF limited to neutral Active Range of Motion (AROM) Comments: limited secondary to hemiparesis LLE Strength Left Hip Flexion: 2/5 Left Hip Extension: 2+/5 Left Knee Flexion: 0/5 Left Knee Extension: 0/5 Left Ankle Dorsiflexion: 0/5 Left Ankle Plantar Flexion: 0/5 LLE Tone LLE Tone: Modified [place];Mild Body Part - Modified Ashworth Scale: Hamstrings Hamstrings - Modified Ashworth Scale for Grading Hypertonia LLE: Slight increase in muscle tone, manifested by a catch, followed by minimal resistance throughout the remainder (less than half) of the ROM    Refer to Care Plan for Long Term Goals  Recommendations for other services: Neuropsych  Discharge Criteria: Patient will be discharged from PT if patient refuses treatment 3 consecutive times without medical reason, if treatment goals not met, if there is a change in medical status, if patient makes no progress towards goals or if patient is discharged from hospital.  The above assessment, treatment plan, treatment alternatives and goals were discussed and mutually agreed upon: by patient  Netta Corrigan, PT, DPT 09/28/2018, 11:58 AM

## 2018-09-28 NOTE — Evaluation (Signed)
Speech Language Pathology Assessment and Plan  Patient Details  Name: Rachel Fox MRN: [id number] Date of Birth: [birth year]  SLP Diagnosis: Dysphagia;Dysarthria;Cognitive Impairments  Rehab Potential: Excellent ELOS: 3-4 weeks    Today's Date: 09/28/2018 SLP Individual Time: 1305-1400 SLP Individual Time Calculation (min): 55 min   Problem List:  Patient Active Problem List   Diagnosis Date Noted  . Right middle cerebral artery stroke ([place]) 09/27/2018  . Pressure injury of skin 09/21/2018  . Cerebrovascular accident (CVA) with involvement of left side of body ([place]) 09/20/2018  . Atrial fibrillation with RVR ([place]) 09/20/2018  . Hypertension   . Rhabdomyolysis   . Type 2 diabetes mellitus ([place])   . Hyperlipidemia   . Hyperbilirubinemia   . Leukocytosis   . Hypernatremia    Past Medical History:  Past Medical History:  Diagnosis Date  . High cholesterol   . Hypertension   . Type 2 diabetes mellitus ([place])    Past Surgical History:  Past Surgical History:  Procedure Laterality Date  . ABDOMINAL HYSTERECTOMY    . TUBAL LIGATION      Assessment / Plan / Recommendation Clinical Impression Patient  is a 73 year old right-handed female with history of hyperlipidemia, hypertension, type 2 diabetes mellitus. Per chart review patient lives alone independent prior to admission still driving.Presented 09/20/2018 after being found down for an extended time. Denied loss of consciousness. Found to have left-sided weakness. Blood pressure 212/171, UDS negative, WBC 14,000, sodium 146, BUN 31, creatinine 1.04, total CK 2651. EKG atrial fibrillation with RVR and cardiology services consulted. Patient was placed on intravenous Cardizem and dosed with digoxin and amiodarone also added. Cranial CT scan showed moderately large subacute right MCA infarction. No hemorrhage. CT cervical spine negative. Echocardiogram with ejection fraction of 65% hyperdynamic systolic function. Carotid  Dopplers with no ICA stenosis.WOCconsulted for left buttock wound stage II pressure injury. Therapy evaluation completed and patient was admitted for a comprehensive rehab program 09/27/18.  Patient demonstrates moderate-severe cognitive impairments characterized by decreased emergent awareness, functional problem solving, reasoning and recall of functional information. Patient also has a right gaze preference but can scan to left visual field to locate items. Patient has moderate oral-motor weakness resulting in dysarthria due to imprecise consonants and a low vocal intensity minimizing intelligibility at the sentence level. Patient's oral-motor weakness also impacts patient's oral function with solid textures characterized by impaired mastication with left buccal pocketing and anterior spillage. However, patient demonstrated efficient mastication and oral clearance with Dys. 2 textures, therefore, recommend trial tray prior to upgrade. Patient also demonstrated prolonged oral transit and what appeared to be delayed swallow initiation with thin liquids but suspect patient would be ready for repeat MBS by end of this week. Patient would benefit from skilled SLP intervention to maximize her cognitive, swallowing and speech function prior to discharge.    Skilled Therapeutic Interventions          Administered a BSE and cognitive-linguistic evaluation, please see above for details. Educated patient in regards to current swallowing, speech and cognitive impairments and goals of skilled SLP intervention, she verbalized understanding and agreement.   SLP Assessment  Patient will need skilled Speech Lanaguage Pathology Services during CIR admission    Recommendations  SLP Diet Recommendations: Dysphagia 1 (Puree);Nectar Liquid Administration via: Straw Medication Administration: Crushed with puree Supervision: Full supervision/cueing for compensatory strategies;Patient able to self feed Compensations:  Lingual sweep for clearance of pocketing;Small sips/bites;Minimize environmental distractions;Slow rate;Monitor for anterior loss Postural Changes and/or Swallow Maneuvers: Seated  upright 90 degrees Oral Care Recommendations: Oral care BID Recommendations for Other Services: Neuropsych consult Patient destination: Home Follow up Recommendations: Home Health SLP;Outpatient SLP;24 hour supervision/assistance Equipment Recommended: None recommended by SLP    SLP Frequency 3 to 5 out of 7 days   SLP Duration  SLP Intensity  SLP Treatment/Interventions 3-4 weeks  Minumum of 1-2 x/day, 30 to 90 minutes  Cognitive remediation/compensation;Dysphagia/aspiration precaution training;Internal/external aids;Speech/Language facilitation;Therapeutic Activities;Environmental controls;Cueing hierarchy;Functional tasks;Patient/family education    Pain Pain Assessment Pain Scale: 0-10 Pain Score: 7  Pain Type: Acute pain Pain Location: Head Pain Orientation: Anterior Pain Descriptors / Indicators: Aching Pain Frequency: Intermittent Pain Onset: On-going Patients Stated Pain Goal: 1 Pain Intervention(s): Medication (See eMAR) Multiple Pain Sites: No  Prior Functioning Type of Home: House  Lives With: Alone Available Help at Discharge: Family;Available 24 hours/day Vocation: Retired  Industrial/product designer Term Goals: Week 1: SLP Short Term Goal 1 (Week 1): Patient will consume trials of Dys. 2 textures with efficient mastication and complete oral clearance without overt s/s of aspiration with Min verbal cues for use of swallowing compensatory strategies. SLP Short Term Goal 2 (Week 1): Patient will consume trials of thin liquids with minimal overt s/s of aspiration over 2 sessions to assess readiness for repeat MBS. SLP Short Term Goal 3 (Week 1): Patient will demonstrate functional problem solving for basic and familiar tasks with Mod A verbal cues. SLP Short Term Goal 4 (Week 1): Patient will utilize memory  compensatory strategies to recall new, daily information with Mod A verbal cues. SLP Short Term Goal 5 (Week 1): Patient will self-monitor and correct errors during functional tasks with Mod verbal and visual cues. SLP Short Term Goal 6 (Week 1): Patient will utilize speech intelligibility strategies at the sentence level with Mod A verbal cues to achieve ~80% intelligibility.  Refer to Care Plan for Long Term Goals  Recommendations for other services: Neuropsych  Discharge Criteria: Patient will be discharged from SLP if patient refuses treatment 3 consecutive times without medical reason, if treatment goals not met, if there is a change in medical status, if patient makes no progress towards goals or if patient is discharged from hospital.  The above assessment, treatment plan, treatment alternatives and goals were discussed and mutually agreed upon: by patient  Bayli Quesinberry 09/28/2018, 2:26 PM

## 2018-09-28 NOTE — Plan of Care (Signed)
  Problem: RH BLADDER ELIMINATION Goal: RH STG MANAGE BLADDER WITH ASSISTANCE Description: STG Manage Bladder With Assistance - Mod Outcome: Progressing   Problem: RH SAFETY Goal: RH STG ADHERE TO SAFETY PRECAUTIONS W/ASSISTANCE/DEVICE Description: STG Adhere to Safety Precautions With Assistance/Device. Mod Outcome: Progressing   Problem: RH KNOWLEDGE DEFICIT Goal: RH STG INCREASE KNOWLEDGE OF DYSPHAGIA/FLUID INTAKE Description: Patient/family will be able to demonstrate safe swallowing techniques and appropriate consistency for food and drink with cues/handouts Outcome: Progressing

## 2018-09-28 NOTE — Progress Notes (Signed)
[facility] PHYSICAL MEDICINE & REHABILITATION PROGRESS NOTE   Subjective/Complaints: The patient has no complaints this morning.  She is about to get dressed and bathed with OT.  Have not assessed sitting balance at this point. Speech is moderately dysarthric. Review of systems difficult to obtained due to cognition poor awareness of deficits.   Objective:   No results found. Recent Labs    09/26/18 0512 09/28/18 0538  WBC 9.6 13.5*  HGB 13.6 13.1  HCT 42.0 40.8  PLT 227 232   Recent Labs    09/26/18 0512 09/27/18 0520 09/28/18 0538  NA 140  --  140  K 3.7  --  3.7  CL 104  --  104  CO2 26  --  27  GLUCOSE 122*  --  124*  BUN 9  --  12  CREATININE 0.88 0.86 0.92  CALCIUM 9.3  --  9.3    Intake/Output Summary (Last 24 hours) at 09/28/2018 0806 Last data filed at 09/27/2018 2057 Gross per 24 hour  Intake 180 ml  Output 21 ml  Net 159 ml     Physical Exam: Vital Signs Blood pressure 113/65, pulse 75, temperature 98.7 F (37.1 C), temperature source Oral, resp. rate 18, height 5\' 3"  (1.6 m), weight 72.5 kg, SpO2 100 %.   General: No acute distress Mood and affect are appropriate Heart: Regular rate and rhythm no rubs murmurs or extra sounds Lungs: Clear to auscultation, breathing unlabored, no rales or wheezes Abdomen: Positive bowel sounds, soft nontender to palpation, nondistended Extremities: No clubbing, cyanosis, or edema Skin: No evidence of breakdown, no evidence of rash Neurologic: Cranial nerves II through XII intact, motor strength is 5/5 in right deltoid, bicep, tricep, grip, hip flexor, knee extensors, ankle dorsiflexor and plantar flexor 0/5 left upper extremity deltoid bicep tricep grip Trace hip knee extensor synergy in the left lower extremity otherwise 0 Sensation absent light touch in the left upper extremity Intact light touch left lower extremity  Cerebellar exam unable to perform on left side due to weakness musculoskeletal: Full range of  motion in all 4 extremities. No joint swelling  Assessment/Plan: 1. Functional deficits secondary to right MCA infarct which require 3+ hours per day of interdisciplinary therapy in a comprehensive inpatient rehab setting.  Physiatrist is providing close team supervision and 24 hour management of active medical problems listed below.  Physiatrist and rehab team continue to assess barriers to discharge/monitor patient progress toward functional and medical goals  Care Tool:  Bathing              Bathing assist       Upper Body Dressing/Undressing Upper body dressing   What is the patient wearing?: Hospital gown only    Upper body assist Assist Level: Moderate Assistance - Patient 50 - 74%    Lower Body Dressing/Undressing Lower body dressing      What is the patient wearing?: Incontinence brief     Lower body assist Assist for lower body dressing: Total Assistance - Patient < 25%     Toileting Toileting    Toileting assist Assist for toileting: Moderate Assistance - Patient 50 - 74%     Transfers Chair/bed transfer  Transfers assist  Chair/bed transfer activity did not occur: Safety/medical concerns        Locomotion Ambulation   Ambulation assist              Walk 10 feet activity   Assist  Walk 50 feet activity   Assist           Walk 150 feet activity   Assist           Walk 10 feet on uneven surface  activity   Assist           Wheelchair     Assist               Wheelchair 50 feet with 2 turns activity    Assist            Wheelchair 150 feet activity     Assist          Medical Problem List and Plan: 1.Lefthemiparesis, dysphagia, and visual-spatial deficitssecondary to right MCA infarction -CIR evals PT OT speech today 2. Antithrombotics: -DVT/anticoagulation:Eliquison board -antiplatelet therapy: N/A 3. Pain Management:Tylenol  as needed -add muscle rub for left shoulder -maintain proper shoulder position and support when in bed and chair. 4. Mood:Provide emotional support -antipsychotic agents: N/A 5. Neuropsych: This patientiscapable of making decisions on [place]rown behalf. 6. Skin/Wound Care:Routine skin checks. Santylleft buttock daily covered with moist gauze and foam dressing. 7. Fluids/Electrolytes/Nutrition:Routine in and outs with follow-up chemistrieson admit 8.Atrial fibrillation. Amiodarone 200 mg twice daily, Lanoxin 0.125 mg daily, Cardizem 90 mg 4 times daily. Cardiac rate controlled. Follow cardiology services Vitals:   09/27/18 1926 09/28/18 0552  BP: 121/65 113/65  Pulse: 75 75  Resp: 18   Temp: 99.3 F (37.4 C) 98.7 F (37.1 C)  SpO2: 100% 100%    9. Dysphagia. Dysphagia #1 nectar liquid. Follow-up speech therapy -encourage fluids 10. Diabetes mellitus. Hemoglobin A1c 6.1. SSI. Patient on Glucophage 500 mg twice daily prior to admission. Resume as indicated 11.Rhabdomyolysis. no sign of renal failure  12. Hyperlipidemia. Lipitor 13. Constipation. Laxative assistance  14.  Leukocytosis not febrile, will check urine culture given cognition patient may not have symptoms.  LOS: 1 days A FACE TO [place] E  09/28/2018, 8:06 AM

## 2018-09-28 NOTE — Evaluation (Signed)
Occupational Therapy Assessment and Plan  Patient Details  Name: Rachel Fox MRN: [id number] Date of Birth: [birth year]  OT Diagnosis: abnormal posture, acute pain, cognitive deficits, hemiplegia affecting non-dominant side, muscle weakness (generalized) and coordination disorder Rehab Potential: Rehab Potential (ACUTE ONLY): Good ELOS: 24-28 days   Today's Date: 09/28/2018 OT Individual Time: 5465-0354 OT Individual Time Calculation (min): 74 min     Problem List:  Patient Active Problem List   Diagnosis Date Noted  . Right middle cerebral artery stroke ([place]) 09/27/2018  . Pressure injury of skin 09/21/2018  . Cerebrovascular accident (CVA) with involvement of left side of body ([place]) 09/20/2018  . Atrial fibrillation with RVR ([place]) 09/20/2018  . Hypertension   . Rhabdomyolysis   . Type 2 diabetes mellitus ([place])   . Hyperlipidemia   . Hyperbilirubinemia   . Leukocytosis   . Hypernatremia     Past Medical History:  Past Medical History:  Diagnosis Date  . High cholesterol   . Hypertension   . Type 2 diabetes mellitus ([place])    Past Surgical History:  Past Surgical History:  Procedure Laterality Date  . ABDOMINAL HYSTERECTOMY    . TUBAL LIGATION      Assessment & Plan Clinical Impression: Patient is a 73 y.o. year old female with history of hyperlipidemia, hypertension, type 2 diabetes mellitus. Per chart review patient lives alone independent prior to admission still driving. 2 level home with 3 steps to entry. Presented 09/20/2018 after being found down for an extended time. Denied loss of consciousness. Found to have left-sided weakness. Blood pressure 212/171, UDS negative, WBC 14,000, sodium 146, BUN 31, creatinine 1.04, total CK 2651. EKG atrial fibrillation with RVR and cardiology services consulted. Patient was placed on intravenous Cardizem and dosed with digoxin and amiodarone also added. Cranial CT scan showed moderately large subacute right MCA  infarction. No hemorrhage. CT cervical spine negative. Echocardiogram with ejection fraction of 65% hyperdynamic systolic function. Carotid Dopplers with no ICA stenosis.WOCconsulted for left buttock wound stage II pressure injury. Presently maintained on Eliquis for CVA prophylaxis as well as atrial fibrillation. Cardizem and amiodarone as well as digoxin transition to p.o. with cardiac rate controlled. CK improved 358. Dysphasia #1 nectar thick liquid. Therapy evaluation completed and patient was admitted for a comprehensive rehab program .  Patient transferred to CIR on 09/27/2018 .    Patient currently requires total with basic self-care skills secondary to muscle weakness, decreased cardiorespiratoy endurance, decreased coordination and decreased motor planning, R gaze preference, decreased midline orientation, decreased initiation, decreased attention, decreased awareness, decreased problem solving, decreased safety awareness and decreased memory and decreased sitting balance, decreased standing balance, decreased postural control, hemiplegia and decreased balance strategies.  Prior to hospitalization, patient could complete ADls and IADLs with independent .  Patient will benefit from skilled intervention to decrease level of assist with basic self-care skills prior to discharge home with care partner.  Anticipate patient will require 24 hour supervision and minimal physical assistance and follow up home health.  OT - End of Session Activity Tolerance: Decreased this session Endurance Deficit: Yes Endurance Deficit Description: multiple rest breaks secondary to fatigue OT Assessment Rehab Potential (ACUTE ONLY): Good OT Barriers to Discharge: (none known at this time) OT Patient demonstrates impairments in the following area(s): Balance;Cognition;Endurance;Motor;Pain;Safety;Sensory;Vision OT Basic ADL's Functional Problem(s): Grooming;Bathing;Dressing;Toileting OT Transfers Functional  Problem(s): Toilet;Tub/Shower OT Additional Impairment(s): Fuctional Use of Upper Extremity OT Plan OT Intensity: Minimum of 1-2 x/day, 45 to 90 minutes OT Frequency: 5 out  of 7 days OT Duration/Estimated Length of Stay: 24-28 days OT Treatment/Interventions: Functional electrical stimulation;Balance/vestibular training;Self Care/advanced ADL retraining;UE/LE Coordination activities;Cognitive remediation/compensation;Functional mobility training;Community reintegration;Neuromuscular re-education;Discharge planning;Therapeutic Activities;Wheelchair propulsion/positioning;Patient/family education;Therapeutic Exercise;DME/adaptive equipment instruction;Psychosocial support;UE/LE Strength taining/ROM OT Self Feeding Anticipated Outcome(s): set up A OT Basic Self-Care Anticipated Outcome(s): S- min A OT Toileting Anticipated Outcome(s): min A OT Bathroom Transfers Anticipated Outcome(s): min A OT Recommendation Recommendations for Other Services: Neuropsych consult Patient destination: Home Follow Up Recommendations: Home health OT;24 hour supervision/assistance Equipment Recommended: To be determined   Skilled Therapeutic Intervention Upon entering the room, pt supine in bed with no c/o pain this session. Pt does report 4/10 L shoulder pain with transitional movement and at risk for subluxation. Pt's BP in supine is 125/52. Supine >sit with mod A to EOB. Pt needing mod A for sitting balance. Pt verbalizing need for BM and required total A for stand pivot transfer to the L. Pt transferred onto toilet in same manner and OT assisted pt with LB clothing management and hygiene with use of stedy for sit <>stand for safety. Pt engaged in bathing and dressing tasks from seated position on toilet for time management. Focus on hemiplegic dressing technique with max cuing for pt to attend to L side. Pt with R gaze preference as well. Pt requesting to return to bed secondary to fatigue. Max A stand into stedy and  pt returned to bed. Bed alarm activated and call bell within reach.   OT Evaluation Vital Signs Therapy Vitals Temp: 98.3 F (36.8 C) Temp Source: Oral Pulse Rate: 98 Resp: 18 BP: (!) 142/57 Patient Position (if appropriate): Lying Oxygen Therapy SpO2: 98 % O2 Device: Room Air Pain Pain Assessment Pain Scale: 0-10 Pain Score: 7  Pain Type: Acute pain Pain Location: Head Pain Orientation: Anterior Pain Descriptors / Indicators: Aching Pain Frequency: Intermittent Pain Onset: On-going Patients Stated Pain Goal: 1 Pain Intervention(s): Medication (See eMAR) Multiple Pain Sites: No Home Living/Prior Functioning Home Living Available Help at Discharge: Family, Available 24 hours/day Type of Home: House Home Access: Other (comment) Entrance Stairs-Number of Steps: 1 step via side entrance Entrance Stairs-Rails: None Home Layout: One level Alternate Level Stairs-Number of Steps: 1 to the bedroom Bathroom Shower/Tub: Tub/shower unit, Industrial/product designer: Yes  Lives With: Alone IADL History Education: high school Prior Function Level of Independence: Independent with basic ADLs, Independent with homemaking with ambulation, Independent with transfers, Independent with gait  Able to Take Stairs?: Yes Driving: Yes Vocation: Retired Associate Professor Overall Cognitive Status: Impaired/Different from baseline Arousal/Alertness: Lethargic Orientation Level: Person;Place;Situation Person: Oriented Place: Oriented Situation: Oriented Year: 2020 Month: July Day of Week: Incorrect Memory: Impaired Memory Impairment: Decreased recall of new information;Decreased short term memory Decreased Short Term Memory: Functional basic Immediate Memory Recall: Sock;Blue;Bed Memory Recall Sock: Not able to recall Memory Recall Blue: With Cue Memory Recall Bed: With Cue Attention: Sustained Focused Attention: Appears intact Sustained Attention: Appears  intact Sustained Attention Impairment: Verbal complex;Functional complex Awareness: Impaired Awareness Impairment: Intellectual impairment Problem Solving: Impaired Problem Solving Impairment: Functional basic;Verbal basic Executive Function: Reasoning;Self Monitoring;Self Correcting Reasoning: Impaired Reasoning Impairment: Functional basic Sequencing: Impaired Sequencing Impairment: Functional basic Self Monitoring: Impaired Self Monitoring Impairment: Functional basic Self Correcting: Impaired Self Correcting Impairment: Functional basic Safety/Judgment: Impaired Sensation Sensation Light Touch: Impaired Detail Central sensation comments: impaired L LE, burning in   the L heel and numbness in the toes Light Touch Impaired Details: Impaired LLE;Impaired LUE Proprioception: Impaired by gross assessment Coordination Gross Motor  Movements are Fluid and Coordinated: No Fine Motor Movements are Fluid and Coordinated: No Coordination and Movement Description: coodination impaired secondary to sensory deficits and L hemiparesis Motor  Motor Motor: Hemiplegia;Abnormal tone Motor - Skilled Clinical Observations: L hemiparesis, hypertonia L hamstrings Mobility  Bed Mobility Bed Mobility: Rolling Left;Sit to Supine;Supine to Sit Rolling Left: Minimal Assistance - Patient > 75% Supine to Sit: Moderate Assistance - Patient 50-74% Sit to Supine: Moderate Assistance - Patient 50-74%  Trunk/Postural Assessment  Cervical Assessment Cervical Assessment: Within Functional Limits Thoracic Assessment Thoracic Assessment: Exceptions to WFL(rounded shoulders) Lumbar Assessment Lumbar Assessment: Exceptions to WFL(posterior pelvic tilt) Postural Control Postural Control: Deficits on evaluation Trunk Control: L lateral lean Righting Reactions: impaired  Balance Balance Balance Assessed: Yes Static Sitting Balance Static Sitting - Level of Assistance: 4: Min assist;5: Stand by  assistance Dynamic Sitting Balance Dynamic Sitting - Level of Assistance: 4: Min assist Sitting balance - Comments: pt progressing to min guard assist with cues for correcting posture to midline  Static Standing Balance Static Standing - Level of Assistance: 2: Max assist Dynamic Standing Balance Dynamic Standing - Level of Assistance: 2: Max assist;1: +1 Total assist Extremity/Trunk Assessment RUE Assessment RUE Assessment: Within Functional Limits LUE Assessment LUE Assessment: Exceptions to [facility] Passive Range of Motion (PROM) Comments: WFLs General Strength Comments: flaccid - at risk for sublux     Refer to Care Plan for Long Term Goals  Recommendations for other services: Neuropsych   Discharge Criteria: Patient will be discharged from OT if patient refuses treatment 3 consecutive times without medical reason, if treatment goals not met, if there is a change in medical status, if patient makes no progress towards goals or if patient is discharged from hospital.  The above assessment, treatment plan, treatment alternatives and goals were discussed and mutually agreed upon: by patient  Gypsy Decant 09/28/2018, 4:04 PM

## 2018-09-28 NOTE — Progress Notes (Signed)
Inpatient Rehabilitation  Patient information reviewed and entered into eRehab system by Lamyra Malcolm M. Bulmaro Feagans, M.A., CCC/SLP, PPS Coordinator.  Information including medical coding, functional ability and quality indicators will be reviewed and updated through discharge.    

## 2018-09-29 ENCOUNTER — Inpatient Hospital Stay (HOSPITAL_COMMUNITY): Payer: Medicare Other | Admitting: Physical Therapy

## 2018-09-29 ENCOUNTER — Inpatient Hospital Stay (HOSPITAL_COMMUNITY): Payer: Medicare Other

## 2018-09-29 ENCOUNTER — Encounter (HOSPITAL_COMMUNITY): Payer: Medicare Other | Admitting: Psychology

## 2018-09-29 ENCOUNTER — Inpatient Hospital Stay (HOSPITAL_COMMUNITY): Payer: Medicare Other | Admitting: Speech Pathology

## 2018-09-29 LAB — GLUCOSE, CAPILLARY
Glucose-Capillary: 113 mg/dL — ABNORMAL HIGH (ref 70–99)
Glucose-Capillary: 109 mg/dL — ABNORMAL HIGH (ref 70–99)
Glucose-Capillary: 106 mg/dL — ABNORMAL HIGH (ref 70–99)
Glucose-Capillary: 140 mg/dL — ABNORMAL HIGH (ref 70–99)

## 2018-09-29 LAB — URINALYSIS, ROUTINE W REFLEX MICROSCOPIC
Bilirubin Urine: NEGATIVE
Glucose, UA: NEGATIVE mg/dL
Hgb urine dipstick: NEGATIVE
Ketones, ur: NEGATIVE mg/dL
Nitrite: NEGATIVE
Protein, ur: NEGATIVE mg/dL
Specific Gravity, Urine: 1.015 (ref 1.005–1.030)
pH: 7 (ref 5.0–8.0)

## 2018-09-29 MED ORDER — TOPIRAMATE 25 MG PO TABS
25.0000 mg | ORAL_TABLET | Freq: Every day | ORAL | Status: DC
  Administered 2018-09-29 – 2018-10-04 (×6): 25 mg via ORAL
  Filled 2018-09-29 (×6): qty 1

## 2018-09-29 MED ORDER — TRAZODONE HCL 50 MG PO TABS
25.0000 mg | ORAL_TABLET | Freq: Once | ORAL | Status: AC
  Administered 2018-09-29: 25 mg via ORAL
  Filled 2018-09-29: qty 1

## 2018-09-29 NOTE — Progress Notes (Signed)
Physical Therapy Session Note  Patient Details  Name: Rachel Fox MRN: [id number] Date of Birth: 04/28/1945  Today's Date: 09/29/2018 PT Individual Time: 1000-1100 PT Individual Time Calculation (min): 60 min   Short Term Goals: Week 1:  PT Short Term Goal 1 (Week 1): Pt will perform bed mobility with min assist PT Short Term Goal 2 (Week 1): Pt will perform bed<>chair transfer with mod assist PT Short Term Goal 3 (Week 1): Pt will initiate gait training  Skilled Therapeutic Interventions/Progress Updates:    Pt received asleep in bed, arousable and agreeable to therapy session. No complaints of pain. Supine to sit with mod A with HOB elevated. Squat pivot transfer bed to w/c with max A to the R. Pt reports urge to use the bathroom. Stedy transfer w/c to toilet. Sit to stand with max A from w/c seat and commode seat height to stedy, mod A to stand from stedy seat. Pt performs multiple sit to stand transfers throughout session from toilet seat and stedy seat height due to ongoing urge to have another BM once pericare has been completed. Pt requires mod to max A for standing balance in stedy due to significant L lateral lean. Pt able to correct with multimodal cueing but unable to maintain midline. Pt is dependent for pericare and clothing management. Stedy transfer back to w/c. Pt left seated in w/c in room with needs in reach, quick release belt in place at end of session.  Therapy Documentation Precautions:  Precautions Precautions: Fall Precaution Comments: L side weak/flaccid, right gaze preference, L shoulder pain (risk for subluxation), pressure injury from laying on floor PTA Restrictions Weight Bearing Restrictions: No    Therapy/Group: Individual Therapy   Excell Seltzer, PT, DPT  09/29/2018, 12:18 PM

## 2018-09-29 NOTE — Progress Notes (Addendum)
[place] PHYSICAL MEDICINE & REHABILITATION PROGRESS NOTE   Subjective/Complaints: Freq urination as well as Right periorbital HA No falls, no sweats or chills  Review of systems difficult to obtained due to cognition poor awareness of deficits.   Objective:   No results found. Recent Labs    09/28/18 0538  WBC 13.5*  HGB 13.1  HCT 40.8  PLT 232   Recent Labs    09/27/18 0520 09/28/18 0538  NA  --  140  K  --  3.7  CL  --  104  CO2  --  27  GLUCOSE  --  124*  BUN  --  12  CREATININE 0.86 0.92  CALCIUM  --  9.3    Intake/Output Summary (Last 24 hours) at 09/29/2018 5053 Last data filed at 09/28/2018 2053 Gross per 24 hour  Intake 540 ml  Output -  Net 540 ml     Physical Exam: Vital Signs Blood pressure 119/71, pulse 82, temperature 98 F (36.7 C), resp. rate 17, height '5\' 3"'$  (1.6 m), weight 72.5 kg, SpO2 100 %.   General: No acute distress Mood and affect are appropriate Heart: Regular rate and rhythm no rubs murmurs or extra sounds Lungs: Clear to auscultation, breathing unlabored, no rales or wheezes Abdomen: Positive bowel sounds, soft nontender to palpation, nondistended Extremities: No clubbing, cyanosis, or edema Skin:Left buttocks decubitus    Neurologic: Cranial nerves II through XII intact, motor strength is 5/5 in right deltoid, bicep, tricep, grip, hip flexor, knee extensors, ankle dorsiflexor and plantar flexor 0/5 left upper extremity deltoid bicep tricep grip Trace hip knee extensor synergy in the left lower extremity otherwise 0 Sensation absent light touch in the left upper extremity Intact light touch left lower extremity  Cerebellar exam unable to perform on left side due to weakness musculoskeletal: Full range of motion in all 4 extremities. No joint swelling  Assessment/Plan: 1. Functional deficits secondary to right MCA infarct which require 3+ hours per day of interdisciplinary therapy in a comprehensive inpatient rehab  setting.  Physiatrist is providing close team supervision and 24 hour management of active medical problems listed below.  Physiatrist and rehab team continue to assess barriers to discharge/monitor patient progress toward functional and medical goals  Care Tool:  Bathing    Body parts bathed by patient: Chest, Abdomen, Right upper leg, Face, Left arm   Body parts bathed by helper: Front perineal area, Buttocks, Left upper leg, Right lower leg, Left lower leg, Right arm     Bathing assist Assist Level: Maximal Assistance - Patient 24 - 49%     Upper Body Dressing/Undressing Upper body dressing   What is the patient wearing?: Pull over shirt    Upper body assist Assist Level: Maximal Assistance - Patient 25 - 49%    Lower Body Dressing/Undressing Lower body dressing      What is the patient wearing?: Pants, Underwear/pull up     Lower body assist Assist for lower body dressing: Dependent - Patient 0%     Toileting Toileting    Toileting assist Assist for toileting: Total Assistance - Patient < 25%     Transfers Chair/bed transfer  Transfers assist  Chair/bed transfer activity did not occur: Safety/medical concerns  Chair/bed transfer assist level: Maximal Assistance - Patient 25 - 49%     Locomotion Ambulation   Ambulation assist   Ambulation activity did not occur: Safety/medical concerns          Walk 10 feet activity  Assist  Walk 10 feet activity did not occur: Safety/medical concerns        Walk 50 feet activity   Assist Walk 50 feet with 2 turns activity did not occur: Safety/medical concerns         Walk 150 feet activity   Assist Walk 150 feet activity did not occur: Safety/medical concerns         Walk 10 feet on uneven surface  activity   Assist Walk 10 feet on uneven surfaces activity did not occur: Safety/medical concerns         Wheelchair     Assist Will patient use wheelchair at discharge?: Yes Type  of Wheelchair: Manual    Wheelchair assist level: Minimal Assistance - Patient > 75% Max wheelchair distance: 50 ft    Wheelchair 50 feet with 2 turns activity    Assist        Assist Level: Minimal Assistance - Patient > 75%   Wheelchair 150 feet activity     Assist Wheelchair 150 feet activity did not occur: Safety/medical concerns        Medical Problem List and Plan: 1.Lefthemiparesis, dysphagia, and visual-spatial deficitssecondary to right MCA infarction Team conference today please see physician documentation under team conference tab, met with team face-to-face to discuss problems,progress, and goals. Formulized individual treatment plan based on medical history, underlying problem and comorbidities. 2. Antithrombotics: -DVT/anticoagulation:Eliquison board -antiplatelet therapy: N/A 3. Pain Management:Tylenol as needed- add topamax for HA -add muscle rub for left shoulder -maintain proper shoulder position and support when in bed and chair. 4. Mood:Provide emotional support -antipsychotic agents: N/A 5. Neuropsych: This patientiscapable of making decisions on herown behalf. 6. Skin/Wound Care:Routine skin checks. Santylleft buttock daily covered with foam dressing. 7. Fluids/Electrolytes/Nutrition:Routine in and outs with follow-up chemistrieson admit 8.Atrial fibrillation. Amiodarone 200 mg twice daily, Lanoxin 0.125 mg daily, Cardizem 90 mg 4 times daily. Cardiac rate controlled. Follow cardiology services Vitals:   09/28/18 2041 09/29/18 0358  BP: 121/62 119/71  Pulse: 75 82  Resp: 17 17  Temp: 98.8 F (37.1 C) 98 F (36.7 C)  SpO2: 100% 100%  controlled   9. Dysphagia. Dysphagia #1 nectar liquid. Follow-up speech therapy -encourage fluids, no signs of PNA 10. Diabetes mellitus. Hemoglobin A1c 6.1. SSI. Patient on Glucophage 500 mg twice  daily prior to admission. Resume as indicated 11.Rhabdomyolysis. no sign of renal failure  12. Hyperlipidemia. Lipitor 13. Constipation. Laxative assistance  14.  Leukocytosis not febrile, will check urinalysis (had pyuria 7/6) urine culture , also with urinary freq.  LOS: 2 days A FACE TO FACE EVALUATION WAS PERFORMED  Charlett Blake 09/29/2018, 8:33 AM

## 2018-09-29 NOTE — Patient Care Conference (Signed)
Inpatient RehabilitationTeam Conference and Plan of Care Update Date: 09/29/2018   Time: 10:30 AM    Patient Name: Rachel PageBarbara C Fox      Medical Record Number: [id number]  Date of Birth: 01-31-1946 Sex: Female         Room/Bed: 4W12C/4W12C-01 Payor Info: Payor: MEDICARE / Plan: MEDICARE PART A AND B / Product Type: *No Product type* /    Admitting Diagnosis: 3. CVA 1 Team  RT. CVA  Admit Date/Time:  09/27/2018  4:04 PM Admission Comments: No comment available   Primary Diagnosis:  <principal problem not specified> Principal Problem: <principal problem not specified>  Patient Active Problem List   Diagnosis Date Noted  . 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 Pressure injury of skin 09/21/2018  . Cerebrovascular accident (CVA) with involvement of left side of body (HCC) 09/20/2018  . Atrial fibrillation with RVR (HCC) 09/20/2018  . Hypertension   . Rhabdomyolysis   . Type 2 diabetes mellitus (HCC)   . Hyperlipidemia   . Hyperbilirubinemia   . Leukocytosis   . Hypernatremia     Expected Discharge Date: Expected Discharge Date: (3 to 4 weeks)  Team Members Present: Physician leading conference: Dr. Claudette LawsAndrew Kirsteins Social Worker Present: Staci AcostaJenny Nikkol Pai, LCSW Nurse Present: Tennis MustLuz Rosero, RN PT Present: Grier RocherAustin Tucker, PT OT Present: Jackquline DenmarkKatie Bradsher, OT SLP Present: Colin BentonMadison Cratch, SLP PPS Coordinator present : Fae PippinMelissa Bowie, SLP     Current Status/Progress Goal Weekly Team Focus  Medical   Patient has left hemiparesis and neglect, headache pain  Reduce readmission risk, reduce fall risk  Start Topamax adjust dosing   Bowel/Bladder   Continent of bowel and bladder, with periods of incontinence, urinary frequency noted LBM-7/15  Regain control of bowel and bladder, regular urinary and bowel pattern  Timed toileting, assist with toileting needs   Swallow/Nutrition/ Hydration   Dys. 1 textures with nectar-thick liquids, Min A  Supervision  trials of upgraded  textures, repeat MBS, use of swallowing strategies   ADL's   R gaze preference, Max A UB self care, total A LB self care, max - total A transfers  min A overall  NMR, self care retraining, balance, strengthening, functional transfers   Mobility   max assist for bed mobility and squat pivot transfers. sit<>stands max-total assist without UE support and pt with L lateral lean.  CGA-min assist  balance, L NMR, transfers, gait initiation, education   Communication   Mod A  Supervision  education and utilization of speech intelligibility strategies   Safety/Cognition/ Behavioral Observations  Mod-Max A  Supervision  initiation, awareness, problem solving and recall   Pain   C/o headache, pain to left shoulder and posterior knees-managed with tylenol and muscle rub cream  Pain <3/10  Assess and address pain every shift and prn   Skin   Stage II initially now unstageable pressure ulcer with slough to base noted to left buttock-santyl and dressing daily  promote healing, not to sustain any additional skin impairments  Assess and address skin every shift, wound care as ordered, frequent repositioning    Rehab Goals Patient on target to meet rehab goals: Yes Rehab Goals Revised: none *See Care Plan and progress notes for long and short-term goals.     Barriers to Discharge  Current Status/Progress Possible Resolutions Date Resolved   Physician    Medical stability     Progressing with therapy  Continue rehabilitation program      Nursing  PT                    OT (none known at this time)                SLP                SW                Discharge Planning/Teaching Needs:  Pt to return to her home with her cousin and son to provide 24/7 assistance.  Pt's family can come in closer to d/c for training.   Team Discussion:  Pt had a right MCA with left neglect.  Pt with headaches and Dr. Letta Pate started topamax.  Pt also has frequent urination and UA checked and  culture is pending.  Pt has a wound from where she laid following her fall, RN changed dressing.  Pt has new PRAFO for heel and foam applied.  Foot is swollen and painful to the touch.  OT eval was mostly focused on bed mobility and bathing and dressing was done on the toilet.  Pt has left shoulder pain and OT asked that team be mindful of not grabbing that arm in transfers.  Pt is max A to tx to weaker side. Pt has right gaze preference, but will track if asked.  OT used steady, as well.  Pt is max A with bed mobility and transfers.  Left leg is pretty flacid.  PT goals are min A.  ST has pt on D1, nectar thick liquids with S goals.  Pt will have a f/u MBS at an appropriate time.  Working on dysarthria, cognition, attention, problem solving, etc.     Revisions to Treatment Plan:  none    Continued Need for Acute Rehabilitation Level of Care: The patient requires daily medical management by a physician with specialized training in physical medicine and rehabilitation for the following conditions: Daily direction of a multidisciplinary physical rehabilitation program to ensure safe treatment while eliciting the highest outcome that is of practical value to the patient.: Yes Daily medical management of patient stability for increased activity during participation in an intensive rehabilitation regime.: Yes Daily analysis of laboratory values and/or radiology reports with any subsequent need for medication adjustment of medical intervention for : Neurological problems   I attest that I was present, lead the team conference, and concur with the assessment and plan of the team.Team conference was held via web/ teleconference due to [place] - 19.   [name], Silvestre Mesi 09/29/2018, 2:11 PM

## 2018-09-29 NOTE — Progress Notes (Signed)
Physical Therapy Session Note  Patient Details  Name: Rachel Fox MRN: [id number] Date of Birth: 01-Apr-1945  Today's Date: 09/29/2018 PT Individual Time: 1300-1400 PT Individual Time Calculation (min): 60 min   Short Term Goals: Week 1:  PT Short Term Goal 1 (Week 1): Pt will perform bed mobility with min assist PT Short Term Goal 2 (Week 1): Pt will perform bed<>chair transfer with mod assist PT Short Term Goal 3 (Week 1): Pt will initiate gait training  Skilled Therapeutic Interventions/Progress Updates:   Pt received sitting in WC and agreeable to PT. Pt transported to rehab gym in [facility]  Sit<>stand in stedy with mod assist from PT and max multimodal cues for midline orientation x 5 with sustained standing balance following each transfer x 30-45 sec. Pt able to maintain standing in stedy with mod progressing to min assist with multimodal cues for improved R weight shift including visual feedback from mirror.    Sit<>stand to rail in hall x 3 with max>mod assist from PT. Pre-gait stepping forward and back with RLE x 3 and LLE x 3. Mod-max assist for L knee stability in stance and to facilitate hip flexion for LE advancement. Gait training at rail in hall x 34f with Max assist for advancement of LLE and mod assist for knee extension in stance phase. Mild L lateral lean that improved with repetitions.   [facility] to nustep with max assist from PT for safety, no activation noted in the LLE with transfer. Nustep reciprocal movement training with L thigh support x 6 minutes with min assist progressing to supervision assist and and max cues for reciprocal movement pattern and attention to the LLE.   Pt returned to room and performed squat transfer to bed with max A. Sit>supine completed with mod assist and left supine in bed with call bell in reach and all needs met.          Therapy Documentation Precautions:  Precautions Precautions:  Fall Precaution Comments: L side weak/flaccid, right gaze preference, L shoulder pain (risk for subluxation), pressure injury from laying on floor PTA Restrictions Weight Bearing Restrictions: No    Pain: Pain Assessment Pain Scale: 0-10 Pain Score: 0-No pain    Therapy/Group: Individual Therapy  ALorie Phenix7/15/2020, 2:14 PM

## 2018-09-29 NOTE — Consult Note (Signed)
Neuropsychological Consultation   Patient:   Rachel Fox   DOB:   [birth year]  MR Number:  [id number]  [facility]  [facility] [address]4W REHAB C[facility] A [address] [id number] [place] [id number] Dept: [phone] Loc: [phone]           Date of Service:   09/29/2018  Start Time:   2 PM End Time:   2:45 PM  Provider/Observer:  Arley PhenixJohn Rylon Poitra, Psy.D.       Clinical Neuropsychologist       Billing Code/Service: [id number] 4 Units  Chief Complaint:    Sanjuana MaeBarbara C. Rachel Fox is a 73 year old female with history of hyperlipidemia, hypertension, T2DM,.  Presented 09/20/2018 after being found down for extended time.  Patient reported that she had been down the whole night.  Denied loss of consciousness.  Reported that while down that night she would "feel an arm on her that she did not receive as her own arm and would push it away and was fearful of arm.  Continued left sided weakness.  CT showed moderately large subacute right MCA infarction involving cortex and white matter in the right temporal, parietal, and frontal lobes and insula.  Patient admitted for CIR program.    Reason for Service:   UVO:[id number]:[place]echille C. Rachel Fox is a 73 year old right-handed female with history of hyperlipidemia, hypertension, type 2 diabetes mellitus. Per chart review patient lives alone independent prior to admission still driving. 2 level home with 3 steps to entry. Presented 09/20/2018 after being found down for an extended time. Denied loss of consciousness. Found to have left-sided weakness. Blood pressure 212/171, UDS negative, WBC 14,000, sodium 146, BUN 31, creatinine 1.04, total CK 2651. EKG atrial fibrillation with RVR and cardiology services consulted. Patient was placed on intravenous Cardizem and dosed with digoxin and amiodarone also added. Cranial CT scan showed moderately large subacute right MCA infarction. No hemorrhage. CT cervical spine negative.  Echocardiogram with ejection fraction of 65% hyperdynamic systolic function. Carotid Dopplers with no ICA stenosis.WOCconsulted for left buttock wound stage II pressure injury. Presently maintained on Eliquis for CVA prophylaxis as well as atrial fibrillation. Cardizem and amiodarone as well as digoxin transition to p.o. with cardiac rate controlled. CK improved 358. Dysphasia #1 nectar thick liquid. Therapy evaluation completed and patient was admitted for a comprehensive rehab program.  Current Status:  Patient reports that she is coping ok with loss of function.  She still has "feeling that her left arm is not her arm but intellectually knows it is her arm now.  Patient having difficulty with comprehension of gestalts of situation and accurate perception of "whole situation" and more focused on isolated aspects of situations.  Patient will need to be aided in comprehension due to right parietal involvement.     Behavioral Observation: Rachel PageBarbara C [name]  presents as a 73 y.o.-year-old Right African American Female who appeared her stated age. her dress was Appropriate and she was Well Groomed and her manners were Appropriate to the situation.  her participation was indicative of Appropriate and Inattentive behaviors.  There were any physical disabilities noted.  she displayed an appropriate level of cooperation and motivation.     Interactions:    Active Inattentive and Redirectable  Attention:   abnormal and attention span appeared shorter than expected for age  Memory:   abnormal; remote memory intact, recent memory impaired  Visuo-spatial:  not examined  Speech (Volume):  normal  Speech:   slurred;  normal  Thought Process:  Coherent and Tangential  Though Content:  WNL; not suicidal and not homicidal  Orientation:   person and place  Judgment:   Poor  Planning:   Poor  Affect:    Appropriate  Mood:    Dysphoric  Insight:   Shallow  Intelligence:   normal  Medical  History:   Past Medical History:  Diagnosis Date  . High cholesterol   . Hypertension   . Type 2 diabetes mellitus ([place])            Psychiatric History:  No prior psychiatric history  Family Med/Psych History:  Family History  Problem Relation Age of Onset  . Stroke Mother   . Heart attack Father   . Stroke Sister   . Heart attack Sister     Impression/DX:  Rachel Fox. Rachel Fox is a 73 year old female with history of hyperlipidemia, hypertension, T2DM,.  Presented 09/20/2018 after being found down for extended time.  Patient reported that she had been down the whole night.  Denied loss of consciousness.  Reported that while down that night she would "feel an arm on her that she did not receive as her own arm and would push it away and was fearful of arm.  Continued left sided weakness.  CT showed moderately large subacute right MCA infarction involving cortex and white matter in the right temporal, parietal, and frontal lobes and insula.  Patient admitted for CIR program.    Patient reports that she is coping ok with loss of function.  She still has "feeling that her left arm is not her arm but intellectually knows it is her arm now.  Patient having difficulty with comprehension of gestalts of situation and accurate perception of "whole situation" and more focused on isolated aspects of situations.  Patient will need to be aided in comprehension due to right parietal involvement.     Disposition/Plan:  Worked on coping and adjustment issues particularly with comprehension of global situation and expectations.    Diagnosis:    Right middle cerebral artery stroke [facility]) - Plan: Ambulatory referral to Neurology         Electronically Signed   _______________________ Ilean Skill, Psy.D.

## 2018-09-29 NOTE — Progress Notes (Signed)
Speech Language Pathology Daily Session Note  Patient Details  Name: Rachel Fox MRN: [id number] Date of Birth: 1945-12-02  Today's Date: 09/29/2018 SLP Individual Time: 0900-0930 SLP Individual Time Calculation (min): 30 min  Short Term Goals: Week 1: SLP Short Term Goal 1 (Week 1): Patient will consume trials of Dys. 2 textures with efficient mastication and complete oral clearance without overt s/s of aspiration with Min verbal cues for use of swallowing compensatory strategies. SLP Short Term Goal 2 (Week 1): Patient will consume trials of thin liquids with minimal overt s/s of aspiration over 2 sessions to assess readiness for repeat MBS. SLP Short Term Goal 3 (Week 1): Patient will demonstrate functional problem solving for basic and familiar tasks with Mod A verbal cues. SLP Short Term Goal 4 (Week 1): Patient will utilize memory compensatory strategies to recall new, daily information with Mod A verbal cues. SLP Short Term Goal 5 (Week 1): Patient will self-monitor and correct errors during functional tasks with Mod verbal and visual cues. SLP Short Term Goal 6 (Week 1): Patient will utilize speech intelligibility strategies at the sentence level with Mod A verbal cues to achieve ~80% intelligibility.  Skilled Therapeutic Interventions: Pt was seen for skilled ST intervention targeting goals for improved cognitive linguistics. SLP facilitated session by reviewing goals from evaluation completed yesterday, including plan to trial advanced consistencies for possible upgrade and/or repeat MBS. Pt required mod verbal cues to locate her cell phone and call bell, both of which were on the left side of the bed. Pt reports awareness of  Reduced intelligibility, and was receptive to strategies to improve clarity of speech. Pt was encouraged to slow rate of speech, increase breath support/loudness, and make exaggerated movements of the lips and tongue. Oral motor strengthening exercises were also  given for left orofacial weakness (lingual lateralization with resistance, and sucking puree material through a straw with straw on left side). Pt was pleasant and cooperative with unfamiliar therapist. Pt was left in bed with alarm on, all needs within reach. Continue ST per current plan of care.  Pain Pain Assessment Pain Scale: 0-10 Pain Score: 0-No pain  Therapy/Group: Individual Therapy   Sasha Rogel B. Quentin Ore, [facility], CCC-SLP Speech Language Pathologist  Shonna Chock 09/29/2018, 12:38 PM

## 2018-09-29 NOTE — Progress Notes (Signed)
[facility] Individual Statement of Services  Patient Name:  Rachel Fox  Date:  09/29/2018  Welcome to the [facility].  Our goal is to provide you with an individualized program based on your diagnosis and situation, designed to meet your specific needs.  With this comprehensive rehabilitation program, you will be expected to participate in at least 3 hours of rehabilitation therapies Monday-Friday, with modified therapy programming on the weekends.  Your rehabilitation program will include the following services:  Physical Therapy (PT), Occupational Therapy (OT), Speech Therapy (ST), 24 hour per day rehabilitation nursing, Neuropsychology, Case Management (Social Worker), Rehabilitation Medicine, Nutrition Services and Pharmacy Services  Weekly team conferences will be held on Wednesdays to discuss your progress.  Your Social Worker will talk with you frequently to get your input and to update you on team discussions.  Team conferences with you and your family in attendance may also be held.  Expected length of stay:  3 to 4 weeks  Overall anticipated outcome:  Minimal assistance  Depending on your progress and recovery, your program may change. Your Social Worker will coordinate services and will keep you informed of any changes. Your Social Worker's name and contact numbers are listed  below.  The following services may also be recommended but are not provided by the [facility] will be made to provide these services after discharge if needed.  Arrangements include referral to agencies that provide these services.  Your insurance has been verified to be:  Medicare and [employer] (supplement) Your primary doctor is:  Dr. Allyn Kenner  Pertinent information will be shared with your doctor and your insurance  company.  Social Worker:  Alfonse Alpers, LCSW  [phone] or [phone]  Information discussed with and copy given to patient by: Trey Sailors, 09/29/2018, 2:19 PM

## 2018-09-29 NOTE — Progress Notes (Signed)
Occupational Therapy Session Note  Patient Details  Name: Rachel Fox MRN: [id number] Date of Birth: 04/10/1945  Today's Date: 09/29/2018 OT Individual Time: 0700-0757 OT Individual Time Calculation (min): 57 min    Short Term Goals: Week 1:  OT Short Term Goal 1 (Week 1): Pt will perform toileting with max A of 1. OT Short Term Goal 2 (Week 1): Pt will perform UB dressing with mod A. OT Short Term Goal 3 (Week 1): Pt will maintain sitting balance during self care with min A overall.  Skilled Therapeutic Interventions/Progress Updates:    1:1. Pt received in bed reporting pain in L heel. Rn alerted to likely beginning of pressure sore. Pt completes LB bathing with MAX A to wahs B feet and buttocks at bed level with rolling B with MOD A overall. Total A to don pants, underwear and socks. Pt reporting need to toilet. Pt completes supine>sittng EOB with MAX A. Total A squat pivot transfers EOB<>w/c<> toilet with VC for foot/hand placement and anterior weight shift. Lateral leans used for cleansing post BM. Pt completesUB dressing with MAX A on toilet with VC for hemi dressing. Exited session with pt seated in bed, exit alarm on and call light in reach  Therapy Documentation Precautions:  Precautions Precautions: Fall Precaution Comments: L side weak/flaccid, right gaze preference, L shoulder pain (risk for subluxation), pressure injury from laying on floor PTA Restrictions Weight Bearing Restrictions: No General:   Vital Signs:   Pain:   ADL:   Vision   Perception    Praxis   Exercises:   Other Treatments:     Therapy/Group: Individual Therapy  Tonny Branch 09/29/2018, 8:00 AM

## 2018-09-30 ENCOUNTER — Inpatient Hospital Stay (HOSPITAL_COMMUNITY): Payer: Medicare Other | Admitting: Physical Therapy

## 2018-09-30 ENCOUNTER — Inpatient Hospital Stay (HOSPITAL_COMMUNITY): Payer: Medicare Other | Admitting: Speech Pathology

## 2018-09-30 ENCOUNTER — Inpatient Hospital Stay (HOSPITAL_COMMUNITY): Payer: Medicare Other | Admitting: Occupational Therapy

## 2018-09-30 DIAGNOSIS — R350 Frequency of micturition: Secondary | ICD-10-CM

## 2018-09-30 LAB — URINE CULTURE

## 2018-09-30 LAB — GLUCOSE, CAPILLARY
Glucose-Capillary: 122 mg/dL — ABNORMAL HIGH (ref 70–99)
Glucose-Capillary: 128 mg/dL — ABNORMAL HIGH (ref 70–99)
Glucose-Capillary: 99 mg/dL (ref 70–99)
Glucose-Capillary: 124 mg/dL — ABNORMAL HIGH (ref 70–99)

## 2018-09-30 NOTE — Progress Notes (Signed)
Orthopedic Tech Progress Note Patient Details:  Shoni C Brosious 05/27/1945 [id number]  Ortho Devices Type of Ortho Device: Prafo boot/shoe Ortho Device/Splint Location: left Ortho Device/Splint Interventions: Application   Post Interventions Patient Tolerated: Well Instructions Provided: Care of device   Ruffus Kamaka C Zira Helinski 09/30/2018, 11:25 AM  

## 2018-09-30 NOTE — Progress Notes (Signed)
Social Work Patient ID: Rachel Fox, female   DOB: Jan 01, 1946, 73 y.o.   MRN: [id number]   CSW met with pt and spoke with her niece via telephone to update them on team conference discussion and targeted length of stay of 3 to 4 weeks.  Pt and niece expressed understanding and are in agreement with pt staying for as long as she needs.  CSW asked Linna Hoff, [place], to complete FMLA paperwork for pt's son, as he plans to be with her some when she is d/c'd.  CSW left him a message re: how to pick up completed and signed paperwork and await his return call.  CSW will continue to follow and assist as needed.

## 2018-09-30 NOTE — Progress Notes (Signed)
Physical Therapy Session Note  Patient Details  Name: Rachel Fox MRN: [id number] Date of Birth: 1945-04-17  Today's Date: 09/30/2018 PT Individual Time: 1415-1515 PT Individual Time Calculation (min): 60 min   Short Term Goals: Week 1:  PT Short Term Goal 1 (Week 1): Pt will perform bed mobility with min assist PT Short Term Goal 2 (Week 1): Pt will perform bed<>chair transfer with mod assist PT Short Term Goal 3 (Week 1): Pt will initiate gait training  Skilled Therapeutic Interventions/Progress Updates:   Pt received supine in bed and agreeable to PT. Supine>sit transfer with max assist and mod cues for midline orientation once sitting EOB.  Sitting balance EOB with supervision assist x 4 minutes while NT obtained vital signs. Squat pivot transfer to the L with max assist and LLE blocked. Reciprocal scooting at [facility] of chair with min assist and moderate cues for weight shifting. Pt transported to day room. Squat pivot transfer to mat table with mod assist and max cues for set up and sequencing. Sitting balance EOM x 10 minutes with visual feedback from mirror. Sit<>stand at EOB with no UE support and max assist from PT followed by standing balance with mod-max assist and moderate cues for visual feed back from mirror to maintain midline and sustained hip and knee extension on the L. Sit>supine on the R with min assist from PT to control the LLE. Rolling R and L with min assist to place litegait sling. Pt returned to sitting EOB with max assist for safety and sitting balance with mod assist initially. Gait training in light gait with max assist and max cues for attention to the LLE x 62f. Multiple standing rest breaks with max assist to sustain terminal knee extension to improve midline orientation. Pt returned to room and performed stedy transfer to bed with mod assist. Sit>supine completed with  Max assist on the L to control the LE and trunk, and then left supine in bed with call bell in  reach and all needs met.           Therapy Documentation Precautions:  Precautions Precautions: Fall Precaution Comments: L side weak/flaccid, right gaze preference, L shoulder pain (risk for subluxation), pressure injury from laying on floor PTA Restrictions Weight Bearing Restrictions: No    Vital Signs: Therapy Vitals Temp: (!) 97.4 F (36.3 C) Pulse Rate: 92 Resp: 17 BP: (!) 122/93 Patient Position (if appropriate): Sitting Oxygen Therapy SpO2: 96 % O2 Device: Room Air Pain: Denies at rest    Therapy/Group: Individual Therapy  ALorie Phenix7/16/2020, 3:44 PM

## 2018-09-30 NOTE — IPOC Note (Signed)
Overall Plan of Care [facility](IPOC) Patient Details Name: Rachel PageBarbara C Lomax MRN: [id number] DOB: 12/26/1945  Admitting Diagnosis: <principal problem not specified>  Hospital Problems: Active Problems:   Right middle cerebral artery stroke [facility](HCC)     Functional Problem List: Nursing Bladder, Bowel, Endurance, Edema, Pain, Nutrition, Perception, Safety, Skin Integrity  PT Balance, Edema, Endurance, Motor, Pain, Perception, Safety, Sensory  OT Balance, Cognition, Endurance, Motor, Pain, Safety, Sensory, Vision  SLP Cognition, Linguistic, Nutrition  TR         Basic ADL's: OT Grooming, Bathing, Dressing, Toileting     Advanced  ADL's: OT       Transfers: PT Bed Mobility, Bed to Chair, Car, Furniture, Civil [employer] fast [place]  OT Toilet, Tub/Shower     Locomotion: PT Stairs, Psychologist, prison and probation servicesWheelchair Mobility, Ambulation     Additional Impairments: OT Fuctional Use of Upper Extremity  SLP Swallowing, Communication, Social Cognition expression Problem Solving, Memory, Awareness  TR      Anticipated Outcomes Item Anticipated Outcome  Self Feeding set up A  Swallowing  Supervision   Basic self-care  S- min A  Toileting  min A   Bathroom Transfers min A  Bowel/Bladder  remain continent and maintain regular pattern of emptying bowel and bladder  Transfers  min assist  Locomotion  min assist  Communication  Supervision  Cognition  Supervision  Pain  less than 4  Safety/Judgment  Remain free of falls, further skin breakdown and infection while on rehab   Therapy Plan: PT Intensity: Minimum of 1-2 x/day ,45 to 90 minutes PT Frequency: 5 out of 7 days PT Duration Estimated Length of Stay: 3-4 weeks OT Intensity: Minimum of 1-2 x/day, 45 to 90 minutes OT Frequency: 5 out of 7 days OT Duration/Estimated Length of Stay: 24-28 days SLP Intensity: Minumum of 1-2 x/day, 30 to 90 minutes SLP Frequency: 3 to 5 out of 7 days SLP Duration/Estimated Length of Stay: 3-4 weeks   Due to the current state of  emergency, patients may not be receiving their 3-hours of Medicare-mandated therapy.   Team Interventions: Nursing Interventions Patient/Family Education  PT interventions DME/adaptive equipment instruction, Neuromuscular re-education, Stair training, UE/LE Strength taining/ROM, Balance/vestibular training, Functional electrical stimulation, Discharge planning, Pain management, Skin care/wound management, Therapeutic Activities, UE/LE Coordination activities, Disease management/prevention, Functional mobility training, Patient/family education, Splinting/orthotics, Therapeutic Exercise, Visual/perceptual remediation/compensation, Cognitive remediation/compensation, Psychosocial support, Community reintegration, Ambulation/gait training, Wheelchair propulsion/positioning  OT Interventions Functional electrical stimulation, Warden/rangerBalance/vestibular training, Self Care/advanced ADL retraining, UE/LE Coordination activities, Cognitive remediation/compensation, Functional mobility training, Community reintegration, Neuromuscular re-education, Discharge planning, Therapeutic Activities, Wheelchair propulsion/positioning, Patient/family education, Therapeutic Exercise, DME/adaptive equipment instruction, Psychosocial support, UE/LE Strength taining/ROM  SLP Interventions Cognitive remediation/compensation, Dysphagia/aspiration precaution training, Internal/external aids, Speech/Language facilitation, Therapeutic Activities, Environmental controls, Cueing hierarchy, Functional tasks, Patient/family education  TR Interventions    SW/CM Interventions Discharge Planning, Psychosocial Support, Patient/Family Education   Barriers to Discharge MD  Medical stability  Nursing      PT      OT (none known at this time)    SLP      SW       Team Discharge Planning: Destination: PT-Home ,OT- Home , SLP-Home Projected Follow-up: PT-Home health PT, OT-  Home health OT, 24 hour supervision/assistance, SLP-Home Health  SLP, Outpatient SLP, 24 hour supervision/assistance Projected Equipment Needs: PT-To be determined, OT- To be determined, SLP-None recommended by SLP Equipment Details: PT- , OT-  Patient/family involved in discharge planning: PT- Patient,  OT-Patient, SLP-Patient  MD ELOS: 21-28 days Medical Rehab Prognosis:  Good Assessment: The patient has been admitted for CIR therapies with the diagnosis of right MCA infarct. The team will be addressing functional mobility, strength, stamina, balance, safety, adaptive techniques and equipment, self-care, bowel and bladder mgt, patient and caregiver education, NMR, visual-spatial awareness, swallowing, cognition, communication. Goals have been set at supervision to min assist with self-care, min assist with transfers and mobility and supervision with cognition, communication and swallowing.   Due to the current state of emergency, patients may not be receiving their 3 hours per day of Medicare-mandated therapy.    Meredith Staggers, MD, FAAPMR      See Team Conference Notes for weekly updates to the plan of care

## 2018-09-30 NOTE — Progress Notes (Signed)
Speech Language Pathology Daily Session Note  Patient Details  Name: SUHEY RADFORD MRN: [id number] Date of Birth: April 07, 1945  Today's Date: 09/30/2018 SLP Individual Time: 0800-0900 SLP Individual Time Calculation (min): 60 min  Short Term Goals: Week 1: SLP Short Term Goal 1 (Week 1): Patient will consume trials of Dys. 2 textures with efficient mastication and complete oral clearance without overt s/s of aspiration with Min verbal cues for use of swallowing compensatory strategies. SLP Short Term Goal 2 (Week 1): Patient will consume trials of thin liquids with minimal overt s/s of aspiration over 2 sessions to assess readiness for repeat MBS. SLP Short Term Goal 3 (Week 1): Patient will demonstrate functional problem solving for basic and familiar tasks with Mod A verbal cues. SLP Short Term Goal 4 (Week 1): Patient will utilize memory compensatory strategies to recall new, daily information with Mod A verbal cues. SLP Short Term Goal 5 (Week 1): Patient will self-monitor and correct errors during functional tasks with Mod verbal and visual cues. SLP Short Term Goal 6 (Week 1): Patient will utilize speech intelligibility strategies at the sentence level with Mod A verbal cues to achieve ~80% intelligibility.  Skilled Therapeutic Interventions:  Skilled treatment session focused on dysphagia and cognition goals. SLP received pt in bed and asleep. Pt required more than a reasonable amount of time to arouse but once awake, pt agreeable and ready to participate in therapy. SLP facilitated session by providing skilled observation of pt consuming dysphagia 2 with nectar thick liquids. After Min A cues, pt able to control left anterior spillage. As a result, pt was upgraded to dysphagia 2 with nectar thick liquids, medicine whole followed with nectar thick liquids. Given pt's increased oropharyngeal abilities and improved cognitive function, recommend MBS on 10/01/18. Additionally, pt with improved  ability to produce intelligible speech at the sentence level with Min A cues to achieve ~75% intelligibility. Additionally, pt with improved left scanning to locate items. Pt left upright in bed, bed alarm on and all needs within reach. Continue per current plan of care.      Pain    Therapy/Group: Individual Therapy  Jayson Waterhouse 09/30/2018, 12:19 PM

## 2018-09-30 NOTE — Progress Notes (Signed)
Orthopedic Tech Progress Note Patient Details:  Rachel Fox January 20, 1946 [id number]  Ortho Devices Type of Ortho Device: Prafo boot/shoe Ortho Device/Splint Location: left Ortho Device/Splint Interventions: Application   Post Interventions Patient Tolerated: Well Instructions Provided: Care of device   [name] 09/30/2018, 11:25 AM

## 2018-09-30 NOTE — Progress Notes (Signed)
Social Work Assessment and Plan   Patient Details  Name: Rachel Fox MRN: [id number] Date of Birth: [birth year]  Today's Date: 09/28/2018  Problem List:  Patient Active Problem List   Diagnosis Date Noted  . Right middle cerebral artery stroke ([place]) 09/27/2018  . Pressure injury of skin 09/21/2018  . Cerebrovascular accident (CVA) with involvement of left side of body ([place]) 09/20/2018  . Atrial fibrillation with RVR ([place]) 09/20/2018  . Hypertension   . Rhabdomyolysis   . Type 2 diabetes mellitus ([place])   . Hyperlipidemia   . Hyperbilirubinemia   . Leukocytosis   . Hypernatremia    Past Medical History:  Past Medical History:  Diagnosis Date  . High cholesterol   . Hypertension   . Type 2 diabetes mellitus ([place])    Past Surgical History:  Past Surgical History:  Procedure Laterality Date  . ABDOMINAL HYSTERECTOMY    . TUBAL LIGATION     Social History:  reports that she has quit smoking. She has never used smokeless tobacco. She reports that she does not drink alcohol or use drugs.  Family / Support Systems Marital Status: Divorced Patient Roles: Parent, Other (Comment)(aunt; grandparent; church member) Children: Luara Faye - son - [phone] Other Supports: DeeDee Straws - niece - [phone] (lives in [place]) Anticipated Caregiver: son, Sonia Side and cousin, Alden Benjamin.  DeeDee also plans to come to [facility] to assist, as well. Ability/Limitations of Caregiver: son works days; Alden Benjamin is a Chartered loss adjuster: 24/7 Family Dynamics: close, supportive family  Social History Preferred language: English Religion: Baptist Read: Yes Write: Yes Employment Status: Retired Public relations account executive Issues: none reported Guardian/Conservator: N/A - MD has determined that pt is capable of making her own decisions.   Abuse/Neglect Abuse/Neglect Assessment Can Be Completed: Yes Physical Abuse: Denies Verbal Abuse: Denies Sexual Abuse: Denies Exploitation of  patient/patient's resources: Denies Self-Neglect: Denies  Emotional Status Pt's affect, behavior and adjustment status: Pt is motivated to work hard so that she can get back to her home.  She is emotionally labile at times, especially when she talks about her family. Recent Psychosocial Issues: none reported Psychiatric History: none reported Substance Abuse History: none reported  Patient / Family Perceptions, Expectations & Goals Pt/Family understanding of illness & functional limitations: Pt and family report a good understanding of pt's condition and current limitations.  Ongoing updates needed to keep family abreast of progress and expected d/c needs. Premorbid pt/family roles/activities: Pt was active with walking daily, driving, going to church, spending time with friends and family, etc. Anticipated changes in roles/activities/participation: Pt would like to resume activities as she is able. Pt/family expectations/goals: Pt wants to return to her home and to regain her independence.  Community Resources [employer]: None Premorbid Home Care/DME Agencies: None Transportation available at discharge: family Resource referrals recommended: Neuropsychology, Support group (specify)(stroke support group once it resumes)  Discharge Planning Living Arrangements: Alone Support Systems: Children, Other relatives, Friends/neighbors, Social worker community Type of Residence: Private residence Insurance Resources: [employer], Multimedia programmer (specify)([employer] as a IT sales professional) Pensions consultant: Radio broadcast assistant Screen Referred: No Money Management: Patient Does the patient have any problems obtaining your medications?: No Home Management: Pt was doing this.  Family can take care of this while pt is recovering. Patient/Family Preliminary Plans: Family plan to care for pt at her home and will be with her 24/7. Social Work Anticipated Follow Up Needs: HH/OP, Support  Group Expected length of stay: 3 to 4 weeks  Clinical Impression  CSW met with pt and later spoke with pt's niece to introduce self and role of CSW, as well as to complete assessment.  Pt was tearful during visit, especially when talking about her family.  Pt was very independent prior to her stroke.  CSW offered support and referred pt to neuropsychologist.  Pt's son has told her to not worry about anything but getting better.  She reports he is very supportive.  Pt will have help from him, her cousin, and her niece.  No current questions/needs/concerns at this time.  CSW will continue to follow and assist as needed.  Bauer Ausborn, Silvestre Mesi 09/30/2018, 12:26 PM

## 2018-09-30 NOTE — Progress Notes (Signed)
Occupational Therapy Session Note  Patient Details  Name: Rachel Fox MRN: [id number] Date of Birth: 1946-03-04  Today's Date: 09/30/2018 OT Individual Time: 0945-1100 OT Individual Time Calculation (min): 75 min    Short Term Goals: Week 1:  OT Short Term Goal 1 (Week 1): Pt will perform toileting with max A of 1. OT Short Term Goal 2 (Week 1): Pt will perform UB dressing with mod A. OT Short Term Goal 3 (Week 1): Pt will maintain sitting balance during self care with min A overall.  Skilled Therapeutic Interventions/Progress Updates:    Pt seen for OT ADL bathing/dressing session. Pt in supine upon arrival, complaints of being "uncomfortable" in bed and requesting to be repositioned. Pt agreeable to transferring OOB.  She transferred to sitting EOB with mod A with VCs for technique. Sitting balance EOB with supervision with R UE holding onto bed rail, however, total LOB to L when she let go of support. Max A squat pivot transfer to w/c with pt able to recall technique for transfer.  Pt agreeable to shower this session. Used STEDY to transfer pt to [facility] in shower. Max A to power into standing into STEDY. She bathed seated on BSC in shower, max A overall, hand over hand to use L UE to wash R UE. Pt with small incontinent BM in shower. She transitioned out of shower and dressed seated in w/c. She was able to independently recall hemi-dressing technique, however, still required mod-max A for UB dressing and total A +2 for LB dressing. Pt stood into STEDY with max A, initially min A for standing balance progressing to mod A with fatigue for standing balance when fatigued while RN changed buttock dressing and +2 required to pull pants up due to pt's strong L lean.  Pt left sitting in w/c at end of session, encouraged to stay sitting up for lunch. L UE supported on pillow and pt left with chair belt alarm on and all needs in reach.   Therapy Documentation Precautions:  Precautions Precautions:  Fall Precaution Comments: L side weak/flaccid, right gaze preference, L shoulder pain (risk for subluxation), pressure injury from laying on floor PTA Restrictions Weight Bearing Restrictions: No   Therapy/Group: Individual Therapy  Shant Hence L 09/30/2018, 7:09 AM

## 2018-09-30 NOTE — Progress Notes (Signed)
[place] PHYSICAL MEDICINE & REHABILITATION PROGRESS NOTE   Subjective/Complaints: Freq urination as well as Right periorbital HA No falls, no sweats or chills  Review of systems difficult to obtained due to cognition poor awareness of deficits.   Objective:   No results found. Recent Labs    09/28/18 0538  WBC 13.5*  HGB 13.1  HCT 40.8  PLT 232   Recent Labs    09/28/18 0538  NA 140  K 3.7  CL 104  CO2 27  GLUCOSE 124*  BUN 12  CREATININE 0.92  CALCIUM 9.3    Intake/Output Summary (Last 24 hours) at 09/30/2018 0953 Last data filed at 09/29/2018 1832 Gross per 24 hour  Intake 118 ml  Output -  Net 118 ml     Physical Exam: Vital Signs Blood pressure 122/70, pulse 81, temperature 98.1 F (36.7 C), resp. rate 18, height 5\' 3"  (1.6 m), weight 72.5 kg, SpO2 97 %.   General: No acute distress Mood and affect are appropriate Heart: Regular rate and rhythm no rubs murmurs or extra sounds Lungs: Clear to auscultation, breathing unlabored, no rales or wheezes Abdomen: Positive bowel sounds, soft nontender to palpation, nondistended Extremities: No clubbing, cyanosis, or edema Skin:Left buttocks decubitus    Neurologic: Cranial nerves II through XII intact, motor strength is 5/5 in right deltoid, bicep, tricep, grip, hip flexor, knee extensors, ankle dorsiflexor and plantar flexor 0/5 left upper extremity deltoid bicep tricep grip Trace hip knee extensor synergy in the left lower extremity otherwise 0 Sensation absent light touch in the left upper extremity Intact light touch left lower extremity  Cerebellar exam unable to perform on left side due to weakness musculoskeletal: Full range of motion in all 4 extremities. No joint swelling  Assessment/Plan: 1. Functional deficits secondary to right MCA infarct which require 3+ hours per day of interdisciplinary therapy in a comprehensive inpatient rehab setting.  Physiatrist is providing close team supervision  and 24 hour management of active medical problems listed below.  Physiatrist and rehab team continue to assess barriers to discharge/monitor patient progress toward functional and medical goals  Care Tool:  Bathing    Body parts bathed by patient: Chest, Abdomen, Right upper leg, Face, Left arm   Body parts bathed by helper: Front perineal area, Buttocks, Left upper leg, Right lower leg, Left lower leg, Right arm     Bathing assist Assist Level: Maximal Assistance - Patient 24 - 49%     Upper Body Dressing/Undressing Upper body dressing   What is the patient wearing?: Pull over shirt    Upper body assist Assist Level: Maximal Assistance - Patient 25 - 49%    Lower Body Dressing/Undressing Lower body dressing      What is the patient wearing?: Incontinence brief     Lower body assist Assist for lower body dressing: Dependent - Patient 0%     Toileting Toileting    Toileting assist Assist for toileting: Total Assistance - Patient < 25%     Transfers Chair/bed transfer  Transfers assist  Chair/bed transfer activity did not occur: Safety/medical concerns  Chair/bed transfer assist level: Maximal Assistance - Patient 25 - 49%     Locomotion Ambulation   Ambulation assist   Ambulation activity did not occur: Safety/medical concerns          Walk 10 feet activity   Assist  Walk 10 feet activity did not occur: Safety/medical concerns        Walk 50 feet activity  Assist Walk 50 feet with 2 turns activity did not occur: Safety/medical concerns         Walk 150 feet activity   Assist Walk 150 feet activity did not occur: Safety/medical concerns         Walk 10 feet on uneven surface  activity   Assist Walk 10 feet on uneven surfaces activity did not occur: Safety/medical concerns         Wheelchair     Assist Will patient use wheelchair at discharge?: Yes Type of Wheelchair: Manual    Wheelchair assist level: Minimal  Assistance - Patient > 75% Max wheelchair distance: 50 ft    Wheelchair 50 feet with 2 turns activity    Assist        Assist Level: Minimal Assistance - Patient > 75%   Wheelchair 150 feet activity     Assist Wheelchair 150 feet activity did not occur: Safety/medical concerns        Medical Problem List and Plan: 1.Lefthemiparesis, dysphagia, and visual-spatial deficitssecondary to right MCA infarction --Continue CIR therapies including PT, OT, and SLP   -PRAFO LLE 2. Antithrombotics: -DVT/anticoagulation:Eliquison board -antiplatelet therapy: N/A 3. Pain Management:Tylenol as needed- added topamax for HA with some benefit -continue muscle rub for left shoulder -maintain proper shoulder position and support when in bed and chair. Discussed with patient again today 4. Mood:Provide emotional support -antipsychotic agents: N/A 5. Neuropsych: This patientiscapable of making decisions on herown behalf. 6. Skin/Wound Care:Routine skin checks. Santylleft buttock daily covered with foam dressing. 7. Fluids/Electrolytes/Nutrition:Routine in and outs with follow-up chemistrieson admit 8.Atrial fibrillation. Amiodarone 200 mg twice daily, Lanoxin 0.125 mg daily, Cardizem 90 mg 4 times daily. Cardiac rate controlled. Follow cardiology services Vitals:   09/29/18 1932 09/30/18 0421  BP: 133/64 122/70  Pulse: 81 81  Resp: 16 18  Temp: 97.9 F (36.6 C) 98.1 F (36.7 C)  SpO2: 100% 97%  HR controlled 7/16  9. Dysphagia. Dysphagia #1 nectar liquid. Follow-up speech therapy -encourage fluids, no signs of PNA  -MBS tomorrow per SLP 10. Diabetes mellitus. Hemoglobin A1c 6.1. SSI. Patient on Glucophage 500 mg twice daily prior to admission. Resume as indicated 11.Rhabdomyolysis. no sign of renal failure  12. Hyperlipidemia. Lipitor 13. Constipation. Laxative  assistance  14.  Leukocytosis not febrile, ua equivocal, ucx multispecies  -still c/o urinary frequency  -recheck CBC tomorrow, recollect  urine for ucx LOS: 3 days A FACE TO FACE EVALUATION WAS PERFORMED  Ranelle OysterZachary T Swartz 09/30/2018, 9:53 AM

## 2018-09-30 NOTE — Progress Notes (Signed)
Pt. Got an order for a urine culture,[facility] [place] PAC was called to verify this order since the pt. Has done a UA and culture two days ago.He said to disregard the new order.Keep monitoring pt. Closely.

## 2018-10-01 ENCOUNTER — Inpatient Hospital Stay (HOSPITAL_COMMUNITY): Payer: Medicare Other

## 2018-10-01 ENCOUNTER — Inpatient Hospital Stay (HOSPITAL_COMMUNITY): Payer: Medicare Other | Admitting: Occupational Therapy

## 2018-10-01 ENCOUNTER — Inpatient Hospital Stay (HOSPITAL_COMMUNITY): Payer: Medicare Other | Admitting: Physical Therapy

## 2018-10-01 ENCOUNTER — Inpatient Hospital Stay (HOSPITAL_COMMUNITY): Payer: Medicare Other | Admitting: Speech Pathology

## 2018-10-01 LAB — GLUCOSE, CAPILLARY
Glucose-Capillary: 116 mg/dL — ABNORMAL HIGH (ref 70–99)
Glucose-Capillary: 108 mg/dL — ABNORMAL HIGH (ref 70–99)
Glucose-Capillary: 136 mg/dL — ABNORMAL HIGH (ref 70–99)
Glucose-Capillary: 116 mg/dL — ABNORMAL HIGH (ref 70–99)

## 2018-10-01 LAB — CBC
HCT: 39.8 % (ref 36.0–46.0)
Hemoglobin: 12.7 g/dL (ref 12.0–15.0)
MCH: 27.2 pg (ref 26.0–34.0)
MCHC: 31.9 g/dL (ref 30.0–36.0)
MCV: 85.2 fL (ref 80.0–100.0)
Platelets: 256 10*3/uL (ref 150–400)
RBC: 4.67 MIL/uL (ref 3.87–5.11)
RDW: 13.8 % (ref 11.5–15.5)
WBC: 11.4 10*3/uL — ABNORMAL HIGH (ref 4.0–10.5)
nRBC: 0 % (ref 0.0–0.2)

## 2018-10-01 NOTE — Progress Notes (Signed)
[place] [facility] PHYSICAL MEDICINE & REHABILITATION PROGRESS NOTE   Subjective/Complaints: HA still linger , no breathing problems no visual  Review of systems difficult to obtained due to cognition poor awareness of deficits.   Objective:   No results found. Recent Labs    10/01/18 0545  WBC 11.4*  HGB 12.7  HCT 39.8  PLT 256   No results for input(s): NA, K, CL, CO2, GLUCOSE, BUN, CREATININE, CALCIUM in the last 72 hours.  Intake/Output Summary (Last 24 hours) at 10/01/2018 0737 Last data filed at 09/30/2018 1746 Gross per 24 hour  Intake 600 ml  Output -  Net 600 ml     Physical Exam: Vital Signs Blood pressure 131/72, pulse 70, temperature 98.4 F (36.9 C), temperature source Oral, resp. rate 18, height 5\' 3"  (1.6 m), weight 72.5 kg, SpO2 100 %.   General: No acute distress Mood and affect are appropriate Heart: Regular rate and rhythm no rubs murmurs or extra sounds Lungs: Clear to auscultation, breathing unlabored, no rales or wheezes Abdomen: Positive bowel sounds, soft nontender to palpation, nondistended Extremities: No clubbing, cyanosis, or edema Skin:Left buttocks decubitus image from 7/15    Neurologic: Cranial nerves II through XII intact, motor strength is 5/5 in right deltoid, bicep, tricep, grip, hip flexor, knee extensors, ankle dorsiflexor and plantar flexor 0/5 left upper extremity deltoid bicep tricep grip Trace hip knee extensor synergy in the left lower extremity otherwise 0 Sensation absent light touch in the left upper extremity Intact light touch left lower extremity  Cerebellar exam unable to perform on left side due to weakness musculoskeletal: Full range of motion in all 4 extremities. No joint swelling  Assessment/Plan: 1. Functional deficits secondary to right MCA infarct which require 3+ hours per day of interdisciplinary therapy in a comprehensive inpatient rehab setting.  Physiatrist is providing close team supervision and 24 hour  management of active medical problems listed below.  Physiatrist and rehab team continue to assess barriers to discharge/monitor patient progress toward functional and medical goals  Care Tool:  Bathing    Body parts bathed by patient: Chest, Abdomen, Right upper leg, Face, Left arm, Front perineal area, Left upper leg   Body parts bathed by helper: Right arm, Buttocks, Right lower leg, Left lower leg     Bathing assist Assist Level: Maximal Assistance - Patient 24 - 49%     Upper Body Dressing/Undressing Upper body dressing   What is the patient wearing?: Pull over shirt    Upper body assist Assist Level: Maximal Assistance - Patient 25 - 49%    Lower Body Dressing/Undressing Lower body dressing      What is the patient wearing?: Underwear/pull up, Pants     Lower body assist Assist for lower body dressing: 2 Helpers     Toileting Toileting    Toileting assist Assist for toileting: Total Assistance - Patient < 25%     Transfers Chair/bed transfer  Transfers assist  Chair/bed transfer activity did not occur: Safety/medical concerns  Chair/bed transfer assist level: Maximal Assistance - Patient 25 - 49%     Locomotion Ambulation   Ambulation assist   Ambulation activity did not occur: Safety/medical concerns          Walk 10 feet activity   Assist  Walk 10 feet activity did not occur: Safety/medical concerns        Walk 50 feet activity   Assist Walk 50 feet with 2 turns activity did not occur: Safety/medical concerns  Walk 150 feet activity   Assist Walk 150 feet activity did not occur: Safety/medical concerns         Walk 10 feet on uneven surface  activity   Assist Walk 10 feet on uneven surfaces activity did not occur: Safety/medical concerns         Wheelchair     Assist Will patient use wheelchair at discharge?: Yes Type of Wheelchair: Manual    Wheelchair assist level: Minimal Assistance - Patient >  75% Max wheelchair distance: 50 ft    Wheelchair 50 feet with 2 turns activity    Assist        Assist Level: Minimal Assistance - Patient > 75%   Wheelchair 150 feet activity     Assist Wheelchair 150 feet activity did not occur: Safety/medical concerns        Medical Problem List and Plan: 1.Lefthemiparesis, dysphagia, and visual-spatial deficitssecondary to right MCA infarction --Continue CIR therapies including PT, OT, and SLP    -PRAFO LLE 2. Antithrombotics: -DVT/anticoagulation:Eliquison board -antiplatelet therapy: N/A 3. Pain Management:Tylenol as needed- added topamax for HA with some benefit -continue muscle rub for left shoulder -maintain proper shoulder position and support when in bed and chair. Discussed with patient again today 4. Mood:Provide emotional support -antipsychotic agents: N/A 5. Neuropsych: This patientiscapable of making decisions on herown behalf. 6. Skin/Wound Care:Routine skin checks. Santylleft buttock daily covered with foam dressing. 7. Fluids/Electrolytes/Nutrition:Routine in and outs with follow-up chemistrieson admit 8.Atrial fibrillation. Amiodarone 200 mg twice daily, Lanoxin 0.125 mg daily, Cardizem 90 mg 4 times daily. Cardiac rate controlled. Follow cardiology services Vitals:   09/30/18 2025 10/01/18 0700  BP: 134/76 131/72  Pulse: 73 70  Resp: 20 18  Temp: 98.7 F (37.1 C) 98.4 F (36.9 C)  SpO2: 99% 100%  HR controlled 7/17  9. Dysphagia. Dysphagia #1 nectar liquid. D2 trials -encourage fluids, no signs of PNA  -MBS f/u with SLP 10. Diabetes mellitus. Hemoglobin A1c 6.1. SSI. Patient on Glucophage 500 mg twice daily prior to admission. Resume as indicated 11.Rhabdomyolysis. no sign of renal failure  12. Hyperlipidemia. Lipitor 13. Constipation. Laxative assistance  14.  Leukocytosis not febrile,  ua equivocal, ucx multispecies  -still c/o urinary frequency  -recheck CBC tomorrow, recollect  urine for ucx LOS: 4 days A FACE TO FACE EVALUATION WAS PERFORMED  Erick Colacendrew E  10/01/2018, 7:37 AM

## 2018-10-01 NOTE — Progress Notes (Signed)
Occupational Therapy Session Note  Patient Details  Name: Rachel Fox MRN: [id number] Date of Birth: 19-Apr-1945  Today's Date: 10/01/2018 OT Individual Time: 0902-1000 OT Individual Time Calculation (min): 58 min   Short Term Goals: Week 1:  OT Short Term Goal 1 (Week 1): Pt will perform toileting with max A of 1. OT Short Term Goal 2 (Week 1): Pt will perform UB dressing with mod A. OT Short Term Goal 3 (Week 1): Pt will maintain sitting balance during self care with min A overall.  Skilled Therapeutic Interventions/Progress Updates:    Pt greeted in bed and amenable to tx. Supine<sit completed with Mod A given instruction and increased time with HOB elevated. Squat pivot<w/c completed with Max A. While sitting in w/c, she completed oral care and handwashing. Focus placed on trunk control when weightshifting forward to reach for items and spit into sink vs on shirt. HOH for integrating L UE as gross stabilizer throughout. Vcs for scanning towards Lt side of sink to find what she needed. Afterwards pt doffed soiled shirt and donned new shirt with Max A and vcs for sustained attention and hemi strategies. Due to soiled brief, sit<stand at sink completed with Mod A for power up. Max A for static standing balance to prevent collapse of Lt side. Lt knee blocked and manual facilitation provided for keeping Lt foot on floor before standing up. Her wound dressing was soiled and malodorous. Completed perihygiene while pt worked on standing balance at sink given visual feeback from mirror for obtaining midline. RN in to change dressing. Pt then reported she felt the urge of a BM. Stedy transfer completed with 2 assist to elevated toilet. Noted Lt lean in supported standing with facilitation and cues needed to improve. Pt reported increased discomfort while sitting on toilet, ultimately unable to void. She was returned to bed using Stedy with 2 assist for repositioning in bed. Hemiplegic side protected. At  end of session pt was left in care of RN for morning medication. Tx focus placed on standing balance, Lt NMR, trunk control, and ADL retraining.    Therapy Documentation Precautions:  Precautions Precautions: Fall Precaution Comments: L side weak/flaccid, right gaze preference, L shoulder pain (risk for subluxation), pressure injury from laying on floor PTA Restrictions Weight Bearing Restrictions: No Vital Signs: Therapy Vitals Temp: 98.2 F (36.8 C) Temp Source: Oral Pulse Rate: 74 Resp: 17 BP: (!) 119/59 Patient Position (if appropriate): Lying Oxygen Therapy SpO2: 100 % O2 Device: Room Air Pain: HA. Provided her with lavender to use via inhalation to address. RN also made aware and was providing her with pain medication at end of tx   ADL:       Therapy/Group: Individual Therapy  Antionio Negron A Joretta Eads 10/01/2018, 4:39 PM

## 2018-10-01 NOTE — Progress Notes (Signed)
Speech Language Pathology Daily Session Note  Patient Details  Name: Rachel Fox MRN: [id number] Date of Birth: [birth year]  Today's Date: 10/01/2018 SLP Individual Time: 1230-1300 SLP Individual Time Calculation (min): 30 min  Short Term Goals: Week 1: SLP Short Term Goal 1 (Week 1): Patient will consume trials of Dys. 2 textures with efficient mastication and complete oral clearance without overt s/s of aspiration with Min verbal cues for use of swallowing compensatory strategies. SLP Short Term Goal 2 (Week 1): Pt will consume thin liquids with minimal overt s/s of aspiration and Min A cues for use of compensatory swallow strategies. SLP Short Term Goal 2 - Progress (Week 1): Updated due to goal met SLP Short Term Goal 3 (Week 1): Patient will demonstrate functional problem solving for basic and familiar tasks with Mod A verbal cues. SLP Short Term Goal 4 (Week 1): Patient will utilize memory compensatory strategies to recall new, daily information with Mod A verbal cues. SLP Short Term Goal 5 (Week 1): Patient will self-monitor and correct errors during functional tasks with Mod verbal and visual cues. SLP Short Term Goal 6 (Week 1): Patient will utilize speech intelligibility strategies at the sentence level with Mod A verbal cues to achieve ~80% intelligibility.  Skilled Therapeutic Interventions:  Skilled treatment session focused dysphagia goals. SLP provided treatment after completion of MBS. Pt requires Mod A cues to prevent anterior spillage when consuming thin liquids via cup. Despite anterior spillage, pt consumed thin liquids via cup with dysphagia 2 snack with no overt s/s of aspiration. Education provided on strict aspiration precautions including continuation of full supervision with PO intake, small bites, sit upright, fully alert with no distractions. Education posted in pt's room and nursing educated.      Pain Pain Assessment Pain Score: 0-No pain  Therapy/Group:  Individual Therapy  Giovanne Nickolson 10/01/2018, 2:06 PM

## 2018-10-01 NOTE — Progress Notes (Signed)
Modified Barium Swallow Progress Note  Patient Details  Name: Rachel Fox MRN: [id number] Date of Birth: [birth year]  Today's Date: 10/01/2018  Modified Barium Swallow completed.  Full report located under Chart Review in the Imaging Section.  Brief recommendations include the following:  Clinical Impression  Pt presents with improved oropharyngeal abilities over previous MBS (09/21/18). During this study pt presents with mild oral phase deficits c/b left labial weakness that results in left anterior spillage and overall mild impairments in mastication of soft solids. Throughout study, pt exhibited premature spillage across all consistencies. When consuming thin liquids via cup, pt with swallow initiation at pyriform sinus.  As a result of premature spillage and swallow initiation at pyriform sinus, pt with silent aspiration of thin liquids when consumed via straw (images were not captured in chart review section). Multiple strategies (effortful hold, head turn) were attempted but ineffective in preventing silent aspiration of thin liquids via straw. Recommend upgrade to thin liquids via cup (NO STRAWS).   Swallow Evaluation Recommendations       SLP Diet Recommendations: Dysphagia 2 (Fine chop) solids;Thin liquid   Liquid Administration via: Cup   Medication Administration: Whole meds with liquid   Supervision: Patient able to self feed;Full supervision/cueing for compensatory strategies   Compensations: Lingual sweep for clearance of pocketing;Small sips/bites;Minimize environmental distractions;Slow rate;Monitor for anterior loss   Postural Changes: Seated upright at 90 degrees   Oral Care Recommendations: Oral care BID        Eithen Castiglia 10/01/2018,11:50 AM

## 2018-10-01 NOTE — Progress Notes (Signed)
Physical Therapy Session Note  Patient Details  Name: Rachel Fox MRN: [id number] Date of Birth: January 28, 1946  Today's Date: 10/01/2018 PT Individual Time: 1420-1530    70 min   Short Term Goals: Week 1:  PT Short Term Goal 1 (Week 1): Pt will perform bed mobility with min assist PT Short Term Goal 2 (Week 1): Pt will perform bed<>chair transfer with mod assist PT Short Term Goal 3 (Week 1): Pt will initiate gait training  Skilled Therapeutic Interventions/Progress Updates:   Pt received supine in bed and agreeable to PT. Supine>sit transfer with mod assist an the R. Sitting balance for PT to don pants and socks on EOB with supervision assist and min cues for midline orientation. Sit<>stand EOB with UE on PT and max assist.    Squat pivot transfers throughout treatment with max assist and max cues for set up, attention the LLE, and improved midline orientation through transfers. .   Standing balance with force R weight shift in parallel bars 2 x 2 minutes. Semi tandem standing 2 x 30 sec BLE to force WB through the RLE and improved quad/glute activation on the L. Sit<>stand performed with HW x 4 with max assist from PT to engage LLE into terminal knee extension. Standing balance with HW 3 x 1 min with max assist and max multimodal cues including visual feedback from mirror to maintain midline.   Pt returned to room and performed squat pivot transfer to bed with mod assist and max cues for set up. Sit>supine completed with mod assist, and left supine in bed with call bell in reach and all needs met.       Therapy Documentation Precautions:  Precautions Precautions: Fall Precaution Comments: L side weak/flaccid, right gaze preference, L shoulder pain (risk for subluxation), pressure injury from laying on floor PTA Restrictions Weight Bearing Restrictions: No Pain: Pain Assessment Pain Score: 0-No pain   Therapy/Group: Individual Therapy  [name] 10/01/2018, 2:46 PM

## 2018-10-02 ENCOUNTER — Inpatient Hospital Stay (HOSPITAL_COMMUNITY): Payer: Medicare Other | Admitting: Speech Pathology

## 2018-10-02 ENCOUNTER — Inpatient Hospital Stay (HOSPITAL_COMMUNITY): Payer: Medicare Other | Admitting: Physical Therapy

## 2018-10-02 ENCOUNTER — Inpatient Hospital Stay (HOSPITAL_COMMUNITY): Payer: Medicare Other | Admitting: Occupational Therapy

## 2018-10-02 LAB — GLUCOSE, CAPILLARY
Glucose-Capillary: 140 mg/dL — ABNORMAL HIGH (ref 70–99)
Glucose-Capillary: 97 mg/dL (ref 70–99)
Glucose-Capillary: 92 mg/dL (ref 70–99)
Glucose-Capillary: 143 mg/dL — ABNORMAL HIGH (ref 70–99)

## 2018-10-02 MED ORDER — PRO-STAT SUGAR FREE PO LIQD
30.0000 mL | Freq: Two times a day (BID) | ORAL | Status: DC
  Administered 2018-10-02 – 2018-11-01 (×61): 30 mL via ORAL
  Filled 2018-10-02 (×61): qty 30

## 2018-10-02 NOTE — Plan of Care (Signed)
  Problem: RH BLADDER ELIMINATION Goal: RH STG MANAGE BLADDER WITH ASSISTANCE Description: STG Manage Bladder With Assistance - Mod Outcome: Progressing   Problem: RH SKIN INTEGRITY Goal: RH STG MAINTAIN SKIN INTEGRITY WITH ASSISTANCE Description: STG Maintain Skin Integrity With Assistance. Mod Outcome: Progressing Goal: RH STG ABLE TO PERFORM INCISION/WOUND CARE W/ASSISTANCE Description: STG Able To Perform Incision/Wound Care With Assistance. Mod Outcome: Progressing   Problem: RH SAFETY Goal: RH STG ADHERE TO SAFETY PRECAUTIONS W/ASSISTANCE/DEVICE Description: STG Adhere to Safety Precautions With Assistance/Device. Mod Outcome: Progressing   Problem: RH PAIN MANAGEMENT Goal: RH STG PAIN MANAGED AT OR BELOW PT'S PAIN GOAL Description: Less than 4 Outcome: Progressing   Problem: RH KNOWLEDGE DEFICIT Goal: RH STG INCREASE KNOWLEDGE OF DIABETES Description: Patient/family will be able to verbalize target blood sugar values, medication and diet to control T2DM with cues/handouts Outcome: Progressing Goal: RH STG INCREASE KNOWLEDGE OF HYPERTENSION Description: Patient/Family will be able to verbalize target BP, interpreting blood pressure readings, medication and diet to control HTN with cues/handouts Outcome: Progressing Goal: RH STG INCREASE KNOWLEDGE OF DYSPHAGIA/FLUID INTAKE Description: Patient/family will be able to demonstrate safe swallowing techniques and appropriate consistency for food and drink with cues/handouts Outcome: Progressing

## 2018-10-02 NOTE — Progress Notes (Signed)
[place] PHYSICAL MEDICINE & REHABILITATION PROGRESS NOTE   Subjective/Complaints: No headache complaints today.  Review of systems difficult to obtained due to cognition poor awareness of deficits.   Objective:   Dg Swallowing Func-speech Pathology  Result Date: 10/01/2018 Objective Swallowing Evaluation: Type of Study: MBS-Modified Barium Swallow Study  Patient Details Name: Rachel Fox MRN: [id number] Date of Birth: 02/05/1946 Today's Date: 10/01/2018 Time: SLP Start Time (ACUTE ONLY): 0845 -SLP Stop Time (ACUTE ONLY): 0917 SLP Time Calculation (min) (ACUTE ONLY): 32 min Past Medical History: Past Medical History: Diagnosis Date  High cholesterol   Hypertension   Type 2 diabetes mellitus ([place])  Past Surgical History: Past Surgical History: Procedure Laterality Date  ABDOMINAL HYSTERECTOMY    TUBAL LIGATION   HPI: 73 year old with history of atrial fibrillation, diabetes mellitus type 2, essential hypertension who was brought to the hospital for evaluation of fall.  She was found to be in mild rhabdomyolysis, atrial fibrillation with RVR and also a large right-sided MCA CVA on the CT of the head  Subjective: alert and asking for water Assessment / Plan / Recommendation CHL IP CLINICAL IMPRESSIONS 10/01/2018 Clinical Impression Pt presents with improved oropharyngeal abilities over previous MBS (09/21/18). During this study pt presents with mild oral phase deficits c/b left labial weakness that results in left anterior spillage and overall mild impairments in mastication of soft solids. Throughout study, pt exhibited premature spillage across all consistencies. When consuming thin liquids via cup, pt with swallow initiation at pyriform sinus.  As a result of premature spillage and swallow initiation at pyriform sinus, pt with silent aspiration of thin liquids when consumed via straw (images were not captured in chart review section). Multiple strategies (effortful hold, head turn) were attempted  but ineffective in preventing silent aspiration of thin liquids via straw. Recommend upgrade to thin liquids via cup (NO STRAWS). SLP Visit Diagnosis Dysphagia, oropharyngeal phase (R13.12) Attention and concentration deficit following -- Frontal lobe and executive function deficit following -- Impact on safety and function Mild aspiration risk   CHL IP TREATMENT RECOMMENDATION 10/01/2018 Treatment Recommendations Therapy as outlined in treatment plan below   Prognosis 09/21/2018 Prognosis for Safe Diet Advancement Good Barriers to Reach Goals -- Barriers/Prognosis Comment -- CHL IP DIET RECOMMENDATION 10/01/2018 SLP Diet Recommendations Dysphagia 2 (Fine chop) solids;Thin liquid Liquid Administration via Cup Medication Administration Whole meds with liquid Compensations Lingual sweep for clearance of pocketing;Small sips/bites;Minimize environmental distractions;Slow rate;Monitor for anterior loss Postural Changes Seated upright at 90 degrees   CHL IP OTHER RECOMMENDATIONS 10/01/2018 Recommended Consults -- Oral Care Recommendations Oral care BID Other Recommendations --   CHL IP FOLLOW UP RECOMMENDATIONS 09/24/2018 Follow up Recommendations Inpatient Rehab   CHL IP FREQUENCY AND DURATION 09/21/2018 Speech Therapy Frequency (ACUTE ONLY) min 2x/week Treatment Duration 2 weeks      CHL IP ORAL PHASE 10/01/2018 Oral Phase Impaired Oral - Pudding Teaspoon -- Oral - Pudding Cup -- Oral - Honey Teaspoon -- Oral - Honey Cup -- Oral - Nectar Teaspoon -- Oral - Nectar Cup Premature spillage Oral - Nectar Straw NT Oral - Thin Teaspoon Premature spillage Oral - Thin Cup Premature spillage Oral - Thin Straw Premature spillage Oral - Puree NT Oral - Mech Soft Impaired mastication;Premature spillage Oral - Regular -- Oral - Multi-Consistency -- Oral - Pill -- Oral Phase - Comment --  CHL IP PHARYNGEAL PHASE 10/01/2018 Pharyngeal Phase Impaired Pharyngeal- Pudding Teaspoon -- Pharyngeal -- Pharyngeal- Pudding Cup -- Pharyngeal --  Pharyngeal- Honey Teaspoon --  Pharyngeal -- Pharyngeal- Honey Cup -- Pharyngeal -- Pharyngeal- Nectar Teaspoon -- Pharyngeal -- Pharyngeal- Nectar Cup Delayed swallow initiation-vallecula Pharyngeal -- Pharyngeal- Nectar Straw NT Pharyngeal -- Pharyngeal- Thin Teaspoon Delayed swallow initiation-pyriform sinuses Pharyngeal -- Pharyngeal- Thin Cup Delayed swallow initiation-pyriform sinuses Pharyngeal -- Pharyngeal- Thin Straw Delayed swallow initiation-pyriform sinuses;Penetration/Aspiration before swallow Pharyngeal Material enters airway, passes BELOW cords without attempt by patient to eject out (silent aspiration) Pharyngeal- Puree NT Pharyngeal -- Pharyngeal- Mechanical Soft Delayed swallow initiation-pyriform sinuses Pharyngeal -- Pharyngeal- Regular -- Pharyngeal -- Pharyngeal- Multi-consistency -- Pharyngeal -- Pharyngeal- Pill -- Pharyngeal -- Pharyngeal Comment --  CHL IP CERVICAL ESOPHAGEAL PHASE 10/01/2018 Cervical Esophageal Phase WFL Pudding Teaspoon -- Pudding Cup -- Honey Teaspoon -- Honey Cup -- Nectar Teaspoon -- Nectar Cup -- Nectar Straw -- Thin Teaspoon -- Thin Cup -- Thin Straw -- Puree -- Mechanical Soft -- Regular -- Multi-consistency -- Pill -- Cervical Esophageal Comment -- Rachel Fox 10/01/2018, 11:50 AM              Recent Labs    10/01/18 0545  WBC 11.4*  HGB 12.7  HCT 39.8  PLT 256   No results for input(s): NA, K, CL, CO2, GLUCOSE, BUN, CREATININE, CALCIUM in the last 72 hours.  Intake/Output Summary (Last 24 hours) at 10/02/2018 1048 Last data filed at 10/02/2018 0805 Gross per 24 hour  Intake 415 ml  Output --  Net 415 ml     Physical Exam: Vital Signs Blood pressure 123/64, pulse 72, temperature 98.2 F (36.8 C), resp. rate 18, height 5\' 3"  (1.6 m), weight 72.5 kg, SpO2 98 %.   General: No acute distress Mood and affect are appropriate Heart: Regular rate and rhythm no rubs murmurs or extra sounds Lungs: Clear to auscultation, breathing unlabored, no  rales or wheezes Abdomen: Positive bowel sounds, soft nontender to palpation, nondistended Extremities: No clubbing, cyanosis, or edema Skin:Left buttocks decubitus image from 7/15    Neurologic: Cranial nerves II through XII intact, motor strength is 5/5 in right deltoid, bicep, tricep, grip, hip flexor, knee extensors, ankle dorsiflexor and plantar flexor 0/5 left upper extremity deltoid bicep tricep grip Trace hip knee extensor synergy in the left lower extremity otherwise 0 Sensation absent light touch in the left upper extremity Intact light touch left lower extremity  Cerebellar exam unable to perform on left side due to weakness musculoskeletal: Full range of motion in all 4 extremities. No joint swelling  Assessment/Plan: 1. Functional deficits secondary to right MCA infarct which require 3+ hours per day of interdisciplinary therapy in a comprehensive inpatient rehab setting.  Physiatrist is providing close team supervision and 24 hour management of active medical problems listed below.  Physiatrist and rehab team continue to assess barriers to discharge/monitor patient progress toward functional and medical goals  Care Tool:  Bathing    Body parts bathed by patient: Chest, Abdomen, Right upper leg, Face, Left arm, Front perineal area, Left upper leg   Body parts bathed by helper: Right arm, Buttocks, Right lower leg, Left lower leg     Bathing assist Assist Level: Maximal Assistance - Patient 24 - 49%     Upper Body Dressing/Undressing Upper body dressing   What is the patient wearing?: Pull over shirt    Upper body assist Assist Level: Maximal Assistance - Patient 25 - 49%    Lower Body Dressing/Undressing Lower body dressing      What is the patient wearing?: Underwear/pull up, Pants     Lower body  assist Assist for lower body dressing: 2 Helpers     Toileting Toileting    Toileting assist Assist for toileting: Total Assistance - Patient < 25%      Transfers Chair/bed transfer  Transfers assist  Chair/bed transfer activity did not occur: Safety/medical concerns  Chair/bed transfer assist level: Maximal Assistance - Patient 25 - 49%     Locomotion Ambulation   Ambulation assist   Ambulation activity did not occur: Safety/medical concerns          Walk 10 feet activity   Assist  Walk 10 feet activity did not occur: Safety/medical concerns        Walk 50 feet activity   Assist Walk 50 feet with 2 turns activity did not occur: Safety/medical concerns         Walk 150 feet activity   Assist Walk 150 feet activity did not occur: Safety/medical concerns         Walk 10 feet on uneven surface  activity   Assist Walk 10 feet on uneven surfaces activity did not occur: Safety/medical concerns         Wheelchair     Assist Will patient use wheelchair at discharge?: Yes Type of Wheelchair: Manual    Wheelchair assist level: Minimal Assistance - Patient > 75% Max wheelchair distance: 50 ft    Wheelchair 50 feet with 2 turns activity    Assist        Assist Level: Minimal Assistance - Patient > 75%   Wheelchair 150 feet activity     Assist Wheelchair 150 feet activity did not occur: Safety/medical concerns        Medical Problem List and Plan: 1.Lefthemiparesis, dysphagia, and visual-spatial deficitssecondary to right MCA infarction --Continue CIR therapies including PT, OT, and SLP    -PRAFO LLE 2. Antithrombotics: -DVT/anticoagulation:Eliquison board -antiplatelet therapy: N/A 3. Pain Management:Tylenol as needed- added topamax for HA with some benefit -continue muscle rub for left shoulder -maintain proper shoulder position and support when in bed and chair. Discussed with patient again today 4. Mood:Provide emotional support -antipsychotic agents: N/A 5. Neuropsych: This patientiscapable of making  decisions on herown behalf. 6. Skin/Wound Care:Routine skin checks. Santylleft buttock daily covered with foam dressing. 7. Fluids/Electrolytes/Nutrition:Routine in and outs with follow-up chemistrieson admit, low albumin 2.6 monitor encourage p.o. intake, add pro-stat 8.Atrial fibrillation. Amiodarone 200 mg twice daily, Lanoxin 0.125 mg daily, Cardizem 90 mg 4 times daily. Cardiac rate controlled. Follow cardiology services Vitals:   10/02/18 0318 10/02/18 0926  BP: 123/64   Pulse: (!) 56 72  Resp: 18   Temp: 98.2 F (36.8 C)   SpO2: 98%   HR controlled 7/18  9. Dysphagia. Dysphagia #2, thin liquids -encourage fluids, no signs of PNA  10. Diabetes mellitus. Hemoglobin A1c 6.1. SSI. Patient on Glucophage 500 mg twice daily prior to admission. Resume as indicated 11.Rhabdomyolysis. no sign of renal failure  12. Hyperlipidemia. Lipitor 13. Constipation. Laxative assistance  14.  Leukocytosis not febrile, ua equivocal, ucx multispecies  11.4K improving LOS: 5 days A FACE TO FACE EVALUATION WAS PERFORMED  Erick Colacendrew E Ziquan Fidel 10/02/2018, 10:48 AM

## 2018-10-02 NOTE — Progress Notes (Signed)
Speech Language Pathology Daily Session Note  Patient Details  Name: Rachel Fox MRN: [id number] Date of Birth: Jan 01, 1946  Today's Date: 10/02/2018 SLP Individual Time: 0539-7673 SLP Individual Time Calculation (min): 55 min  Short Term Goals: Week 1: SLP Short Term Goal 1 (Week 1): Patient will consume trials of Dys. 2 textures with efficient mastication and complete oral clearance without overt s/s of aspiration with Min verbal cues for use of swallowing compensatory strategies. SLP Short Term Goal 2 (Week 1): Pt will consume thin liquids with minimal overt s/s of aspiration and Min A cues for use of compensatory swallow strategies. SLP Short Term Goal 2 - Progress (Week 1): Updated due to goal met SLP Short Term Goal 3 (Week 1): Patient will demonstrate functional problem solving for basic and familiar tasks with Mod A verbal cues. SLP Short Term Goal 4 (Week 1): Patient will utilize memory compensatory strategies to recall new, daily information with Mod A verbal cues. SLP Short Term Goal 5 (Week 1): Patient will self-monitor and correct errors during functional tasks with Mod verbal and visual cues. SLP Short Term Goal 6 (Week 1): Patient will utilize speech intelligibility strategies at the sentence level with Mod A verbal cues to achieve ~80% intelligibility.  Skilled Therapeutic Interventions: Skilled treatment session focused on dysphagia and cognitive goals. SLP facilitated session by providing supervision level verbal cues for use of a slow rate of self-feeding and to self-monitor and correct left anterior spillage and pocketing with lunch meal of Dys. 2 textures with thin liquids via cup. Patient consumed meal with subtle and intermittent overt s/s of aspiration. Recommend patient continue current diet with full supervision. Patient requested to use the bathroom and required Mod-Max A verbal cues for sequencing and problem solving with task. Patient was transferred to the commode  with +2 via the [facility] but was unable to have a bowel movement. SLP also provided Max A verbal cues for orientation to city and Min A verbal cues for sustained attention to self-feeding. Patient left upright in bed with alarm on and all needs within reach. Continue with current plan of care.      Pain No/Denies Pain   Therapy/Group: Individual Therapy  Geneieve Duell 10/02/2018, 1:35 PM

## 2018-10-02 NOTE — Progress Notes (Signed)
Occupational Therapy Session Note  Patient Details  Name: Rachel Fox MRN: [id number] Date of Birth: Sep 15, 1945  Today's Date: 10/02/2018 OT Individual Time: 1420-1535 OT Individual Time Calculation (min): 75 min   Short Term Goals: Week 1:  OT Short Term Goal 1 (Week 1): Pt will perform toileting with max A of 1. OT Short Term Goal 2 (Week 1): Pt will perform UB dressing with mod A. OT Short Term Goal 3 (Week 1): Pt will maintain sitting balance during self care with min A overall.  Skilled Therapeutic Interventions/Progress Updates:    Pt greeted in bed, requesting to use the toilet. Used padded BSC as pt prefers the cushioned seat. Mod A for sit<stand in Stedy to transfer to [facility]. Close supervision for balance while sitting on BSC with unilateral UE support on Stedy bar. Pt with continent void of bladder, but unable to void bowels. Stedy transfer<w/c completed with Mod A for sit<stand. Pt exhibits Lt lean during unsupported sitting but able to improve with manual, visual, and verbal cuing. She first completed handwashing with vcs for joint protection when elevating affected arm onto sink. Oral care completed with HOH to integrate Lt as gross stabilizer. Vcs for finding items on Lt side of sink. She then completed bathing/dressing sit<stand. HOH for using Lt hand to wash Rt side. Seated figure 4 position utilized for washing feet. Due to increased pain in L LE with figure 4, did not implement this seated strategy during dressing tasks. Mod-Max A for sit<stand at sink. Pt able to maintain standing balance with Mod A to support Lt side and manual cuing for upright postural alignment. OT completed perihygiene and elevated LB garments over hips. RN was also present to change soiled wound dressings. At end of session pt completed squat pivot<bed with Max A towards Lt side. 2 assist for sit<supine and boosting up in bed. Pt was positioned in sidelying for pressure relief and comfort. At end of session  pt was left with all needs within reach and bed alarm set. Tx focus placed on ADL retraining, Lt NMR, sit<stands, and balance.    Therapy Documentation Precautions:  Precautions Precautions: Fall Precaution Comments: L side weak/flaccid, right gaze preference, L shoulder pain (risk for subluxation), pressure injury from laying on floor PTA Restrictions Weight Bearing Restrictions: No Pain: HA. She did not want anything medicinally from RN to address. Provided her with ice pack for forehead at end of session.    ADL:       Therapy/Group: Individual Therapy  Miron Marxen A Blanch Stang 10/02/2018, 4:19 PM

## 2018-10-02 NOTE — Progress Notes (Signed)
Physical Therapy Session Note  Patient Details  Name: Rachel Fox MRN: [id number] Date of Birth: [birth year]  Today's Date: 10/02/2018 PT Individual Time: 0920-1000 PT Individual Time Calculation (min): 40 min   Short Term Goals: Week 1:  PT Short Term Goal 1 (Week 1): Pt will perform bed mobility with min assist PT Short Term Goal 2 (Week 1): Pt will perform bed<>chair transfer with mod assist PT Short Term Goal 3 (Week 1): Pt will initiate gait training  Skilled Therapeutic Interventions/Progress Updates:   Pt received supine in bed with RN present for wound dressing. PT returned in 20 minutes and pt agreeable to bed level PT. Supine NMR, Bridges 2 x 8. PNF pelvic rotation stabilizing reversals 6 sec hold. Full pelvic rotation R and L. SLR with AAROM on the L, SAQ with AAROM on the R. Rolling R and L with mod assist for L side trunk control. Side lying shoulder protraction/retraction with exaggeration to engage deep core. 2 x 10. Pt left supine in bed with call bell in reach.        Therapy Documentation Precautions:  Precautions Precautions: Fall Precaution Comments: L side weak/flaccid, right gaze preference, L shoulder pain (risk for subluxation), pressure injury from laying on floor PTA Restrictions Weight Bearing Restrictions: No  Vital Signs: Therapy Vitals Temp: 98 F (36.7 C) Pulse Rate: 90 Resp: 18 BP: (!) 128/56 Patient Position (if appropriate): Lying Oxygen Therapy SpO2: 100 % O2 Device: Room Air  Pain: denies    Therapy/Group: Individual Therapy  [name] 10/02/2018, 2:08 PM

## 2018-10-03 ENCOUNTER — Inpatient Hospital Stay (HOSPITAL_COMMUNITY): Payer: Medicare Other | Admitting: Speech Pathology

## 2018-10-03 LAB — GLUCOSE, CAPILLARY
Glucose-Capillary: 92 mg/dL (ref 70–99)
Glucose-Capillary: 106 mg/dL — ABNORMAL HIGH (ref 70–99)
Glucose-Capillary: 101 mg/dL — ABNORMAL HIGH (ref 70–99)
Glucose-Capillary: 103 mg/dL — ABNORMAL HIGH (ref 70–99)

## 2018-10-03 NOTE — Progress Notes (Signed)
C/O HA, PRN tylenol given and ice pack applied to head with relief. Left shoulder discomfort managed with scheduled sports cream. Urinary frequency. Dressing changed to left buttock, dressing saturated with serosanguinous drainage. Wound with very strong odor. Requested left PRAFO boot off at 0500.  Foam dressing in place to left heel.Rachel Fox A

## 2018-10-03 NOTE — Progress Notes (Signed)
Speech Language Pathology Daily Session Note  Patient Details  Name: Rachel Fox MRN: [id number] Date of Birth: [birth year]  Today's Date: 10/03/2018 SLP Individual Time: 0800-0900 SLP Individual Time Calculation (min): 60 min  Short Term Goals: Week 1: SLP Short Term Goal 1 (Week 1): Patient will consume trials of Dys. 2 textures with efficient mastication and complete oral clearance without overt s/s of aspiration with Min verbal cues for use of swallowing compensatory strategies. SLP Short Term Goal 2 (Week 1): Pt will consume thin liquids with minimal overt s/s of aspiration and Min A cues for use of compensatory swallow strategies. SLP Short Term Goal 2 - Progress (Week 1): Updated due to goal met SLP Short Term Goal 3 (Week 1): Patient will demonstrate functional problem solving for basic and familiar tasks with Mod A verbal cues. SLP Short Term Goal 4 (Week 1): Patient will utilize memory compensatory strategies to recall new, daily information with Mod A verbal cues. SLP Short Term Goal 5 (Week 1): Patient will self-monitor and correct errors during functional tasks with Mod verbal and visual cues. SLP Short Term Goal 6 (Week 1): Patient will utilize speech intelligibility strategies at the sentence level with Mod A verbal cues to achieve ~80% intelligibility.  Skilled Therapeutic Interventions:  Pt was seen for skilled ST targeting goals for cognition and dysphagia.  Pt was eating breakfast with full supervision from nursing staff.  SLP assumed supervision for remainder of meal.  Pt consumed presentations of her currently prescribed diet with supervision cues to monitor and correct left sided loss of boluses.  No overt s/s of aspiration were evident with solids or liquids and pt was able to recall swallowing precautions with supervision question cues.  Pt demonstrated decreased awareness of therapist seated on her left side and only made eye contact with therapist on the left x1  throughout therapy session.  However, pt was able to sequence and problem solve self feeding of breakfast with set up assistance.  Pt reports feeling confused at times and becoming disoriented upon awakening.  Therapist provided skilled education regarding techniques to facilitate improved orientation and decrease confusion in the hospital, including keeping her phone nearby and using environmental cues for reorientation.  A sign was posted in pt's room with orientation information and pt's phone was left on her charger within her reach (pt had been unable to use phone as the battery had died and it was lost in her bed linens).  Pt was left in bed with bed alarm set and all needs within reach.  Continue per current plan of care.    Pain Pain Assessment Pain Scale: 0-10 Pain Score: 0-No pain Pain Type: Acute pain Pain Location: Buttocks Pain Descriptors / Indicators: Aching;Sore Pain Onset: Gradual Pain Intervention(s): Medication (See eMAR);Repositioned  Therapy/Group: Individual Therapy  Keiko Myricks, Selinda Orion 10/03/2018, 12:23 PM

## 2018-10-03 NOTE — Plan of Care (Signed)
  Problem: RH BLADDER ELIMINATION Goal: RH STG MANAGE BLADDER WITH ASSISTANCE Description: STG Manage Bladder With Assistance - Mod Outcome: Progressing   Problem: RH SKIN INTEGRITY Goal: RH STG MAINTAIN SKIN INTEGRITY WITH ASSISTANCE Description: STG Maintain Skin Integrity With Assistance. Mod Outcome: Progressing   Problem: RH SAFETY Goal: RH STG ADHERE TO SAFETY PRECAUTIONS W/ASSISTANCE/DEVICE Description: STG Adhere to Safety Precautions With Assistance/Device. Mod Outcome: Progressing   Problem: RH PAIN MANAGEMENT Goal: RH STG PAIN MANAGED AT OR BELOW PT'S PAIN GOAL Description: Less than 4 Outcome: Progressing   Problem: RH KNOWLEDGE DEFICIT Goal: RH STG INCREASE KNOWLEDGE OF DIABETES Description: Patient/family will be able to verbalize target blood sugar values, medication and diet to control T2DM with cues/handouts Outcome: Progressing Goal: RH STG INCREASE KNOWLEDGE OF HYPERTENSION Description: Patient/Family will be able to verbalize target BP, interpreting blood pressure readings, medication and diet to control HTN with cues/handouts Outcome: Progressing Goal: RH STG INCREASE KNOWLEDGE OF DYSPHAGIA/FLUID INTAKE Description: Patient/family will be able to demonstrate safe swallowing techniques and appropriate consistency for food and drink with cues/handouts Outcome: Progressing   Problem: RH SKIN INTEGRITY Goal: RH STG ABLE TO PERFORM INCISION/WOUND CARE W/ASSISTANCE Description: STG Able To Perform Incision/Wound Care With Assistance. Mod Outcome: Not Progressing

## 2018-10-03 NOTE — Progress Notes (Signed)
[place] PHYSICAL MEDICINE & REHABILITATION PROGRESS NOTE   Subjective/Complaints: The patient is resting today, she is glad she gets a day off of therapy.  Review of systems difficult to obtained due to cognition poor awareness of deficits.   Objective:   Dg Swallowing Func-speech Pathology  Result Date: 10/01/2018 Objective Swallowing Evaluation: Type of Study: MBS-Modified Barium Swallow Study  Patient Details Name: Rachel Fox MRN: [id number] Date of Birth: January 08, 1946 Today's Date: 10/01/2018 Time: SLP Start Time (ACUTE ONLY): 0845 -SLP Stop Time (ACUTE ONLY): 0917 SLP Time Calculation (min) (ACUTE ONLY): 32 min Past Medical History: Past Medical History: Diagnosis Date  High cholesterol   Hypertension   Type 2 diabetes mellitus ([place])  Past Surgical History: Past Surgical History: Procedure Laterality Date  ABDOMINAL HYSTERECTOMY    TUBAL LIGATION   HPI: 73 year old with history of atrial fibrillation, diabetes mellitus type 2, essential hypertension who was brought to the hospital for evaluation of fall.  She was found to be in mild rhabdomyolysis, atrial fibrillation with RVR and also a large right-sided MCA CVA on the CT of the head  Subjective: alert and asking for water Assessment / Plan / Recommendation CHL IP CLINICAL IMPRESSIONS 10/01/2018 Clinical Impression Pt presents with improved oropharyngeal abilities over previous MBS (09/21/18). During this study pt presents with mild oral phase deficits c/b left labial weakness that results in left anterior spillage and overall mild impairments in mastication of soft solids. Throughout study, pt exhibited premature spillage across all consistencies. When consuming thin liquids via cup, pt with swallow initiation at pyriform sinus.  As a result of premature spillage and swallow initiation at pyriform sinus, pt with silent aspiration of thin liquids when consumed via straw (images were not captured in chart review section). Multiple strategies  (effortful hold, head turn) were attempted but ineffective in preventing silent aspiration of thin liquids via straw. Recommend upgrade to thin liquids via cup (NO STRAWS). SLP Visit Diagnosis Dysphagia, oropharyngeal phase (R13.12) Attention and concentration deficit following -- Frontal lobe and executive function deficit following -- Impact on safety and function Mild aspiration risk   CHL IP TREATMENT RECOMMENDATION 10/01/2018 Treatment Recommendations Therapy as outlined in treatment plan below   Prognosis 09/21/2018 Prognosis for Safe Diet Advancement Good Barriers to Reach Goals -- Barriers/Prognosis Comment -- CHL IP DIET RECOMMENDATION 10/01/2018 SLP Diet Recommendations Dysphagia 2 (Fine chop) solids;Thin liquid Liquid Administration via Cup Medication Administration Whole meds with liquid Compensations Lingual sweep for clearance of pocketing;Small sips/bites;Minimize environmental distractions;Slow rate;Monitor for anterior loss Postural Changes Seated upright at 90 degrees   CHL IP OTHER RECOMMENDATIONS 10/01/2018 Recommended Consults -- Oral Care Recommendations Oral care BID Other Recommendations --   CHL IP FOLLOW UP RECOMMENDATIONS 09/24/2018 Follow up Recommendations Inpatient Rehab   CHL IP FREQUENCY AND DURATION 09/21/2018 Speech Therapy Frequency (ACUTE ONLY) min 2x/week Treatment Duration 2 weeks      CHL IP ORAL PHASE 10/01/2018 Oral Phase Impaired Oral - Pudding Teaspoon -- Oral - Pudding Cup -- Oral - Honey Teaspoon -- Oral - Honey Cup -- Oral - Nectar Teaspoon -- Oral - Nectar Cup Premature spillage Oral - Nectar Straw NT Oral - Thin Teaspoon Premature spillage Oral - Thin Cup Premature spillage Oral - Thin Straw Premature spillage Oral - Puree NT Oral - Mech Soft Impaired mastication;Premature spillage Oral - Regular -- Oral - Multi-Consistency -- Oral - Pill -- Oral Phase - Comment --  CHL IP PHARYNGEAL PHASE 10/01/2018 Pharyngeal Phase Impaired Pharyngeal- Pudding Teaspoon -- Pharyngeal --  Pharyngeal- Pudding Cup -- Pharyngeal -- Pharyngeal- Honey Teaspoon -- Pharyngeal -- Pharyngeal- Honey Cup -- Pharyngeal -- Pharyngeal- Nectar Teaspoon -- Pharyngeal -- Pharyngeal- Nectar Cup Delayed swallow initiation-vallecula Pharyngeal -- Pharyngeal- Nectar Straw NT Pharyngeal -- Pharyngeal- Thin Teaspoon Delayed swallow initiation-pyriform sinuses Pharyngeal -- Pharyngeal- Thin Cup Delayed swallow initiation-pyriform sinuses Pharyngeal -- Pharyngeal- Thin Straw Delayed swallow initiation-pyriform sinuses;Penetration/Aspiration before swallow Pharyngeal Material enters airway, passes BELOW cords without attempt by patient to eject out (silent aspiration) Pharyngeal- Puree NT Pharyngeal -- Pharyngeal- Mechanical Soft Delayed swallow initiation-pyriform sinuses Pharyngeal -- Pharyngeal- Regular -- Pharyngeal -- Pharyngeal- Multi-consistency -- Pharyngeal -- Pharyngeal- Pill -- Pharyngeal -- Pharyngeal Comment --  CHL IP CERVICAL ESOPHAGEAL PHASE 10/01/2018 Cervical Esophageal Phase WFL Pudding Teaspoon -- Pudding Cup -- Honey Teaspoon -- Honey Cup -- Nectar Teaspoon -- Nectar Cup -- Nectar Straw -- Thin Teaspoon -- Thin Cup -- Thin Straw -- Puree -- Mechanical Soft -- Regular -- Multi-consistency -- Pill -- Cervical Esophageal Comment -- Rachel Fox 10/01/2018, 11:50 AM              Recent Labs    10/01/18 0545  WBC 11.4*  HGB 12.7  HCT 39.8  PLT 256   No results for input(s): NA, K, CL, CO2, GLUCOSE, BUN, CREATININE, CALCIUM in the last 72 hours.  Intake/Output Summary (Last 24 hours) at 10/03/2018 1049 Last data filed at 10/03/2018 1016 Gross per 24 hour  Intake 924 ml  Output 125 ml  Net 799 ml     Physical Exam: Vital Signs Blood pressure 123/68, pulse 64, temperature 98 F (36.7 C), temperature source Oral, resp. rate 16, height 5\' 3"  (1.6 m), weight 72.5 kg, SpO2 100 %.   General: No acute distress Mood and affect are appropriate Heart: Regular rate and rhythm no rubs murmurs or  extra sounds Lungs: Clear to auscultation, breathing unlabored, no rales or wheezes Abdomen: Positive bowel sounds, soft nontender to palpation, nondistended Extremities: No clubbing, cyanosis, or edema Skin: Dressing on left buttocks decubitus    Neurologic: Cranial nerves II through XII intact, motor strength is 5/5 in right deltoid, bicep, tricep, grip, hip flexor, knee extensors, ankle dorsiflexor and plantar flexor 0/5 left upper extremity deltoid bicep tricep grip Trace hip knee extensor synergy in the left lower extremity otherwise 0 Sensation absent light touch in the left upper extremity Intact light touch left lower extremity  Cerebellar exam unable to perform on left side due to weakness musculoskeletal: Full range of motion in all 4 extremities. No joint swelling  Assessment/Plan: 1. Functional deficits secondary to right MCA infarct which require 3+ hours per day of interdisciplinary therapy in a comprehensive inpatient rehab setting.  Physiatrist is providing close team supervision and 24 hour management of active medical problems listed below.  Physiatrist and rehab team continue to assess barriers to discharge/monitor patient progress toward functional and medical goals  Care Tool:  Bathing    Body parts bathed by patient: Chest, Abdomen, Right upper leg, Face, Front perineal area, Left upper leg   Body parts bathed by helper: Right arm, Buttocks, Right lower leg, Left lower leg, Left arm     Bathing assist Assist Level: Maximal Assistance - Patient 24 - 49%     Upper Body Dressing/Undressing Upper body dressing   What is the patient wearing?: Pull over shirt    Upper body assist Assist Level: Maximal Assistance - Patient 25 - 49%    Lower Body Dressing/Undressing Lower body dressing  What is the patient wearing?: Underwear/pull up, Pants     Lower body assist Assist for lower body dressing: Maximal Assistance - Patient 25 - 49%      Toileting Toileting    Toileting assist Assist for toileting: Total Assistance - Patient < 25%(using Stedy)     Transfers Chair/bed transfer  Transfers assist  Chair/bed transfer activity did not occur: Safety/medical concerns  Chair/bed transfer assist level: Maximal Assistance - Patient 25 - 49%     Locomotion Ambulation   Ambulation assist   Ambulation activity did not occur: Safety/medical concerns          Walk 10 feet activity   Assist  Walk 10 feet activity did not occur: Safety/medical concerns        Walk 50 feet activity   Assist Walk 50 feet with 2 turns activity did not occur: Safety/medical concerns         Walk 150 feet activity   Assist Walk 150 feet activity did not occur: Safety/medical concerns         Walk 10 feet on uneven surface  activity   Assist Walk 10 feet on uneven surfaces activity did not occur: Safety/medical concerns         Wheelchair     Assist Will patient use wheelchair at discharge?: Yes Type of Wheelchair: Manual    Wheelchair assist level: Minimal Assistance - Patient > 75% Max wheelchair distance: 50 ft    Wheelchair 50 feet with 2 turns activity    Assist        Assist Level: Minimal Assistance - Patient > 75%   Wheelchair 150 feet activity     Assist Wheelchair 150 feet activity did not occur: Safety/medical concerns        Medical Problem List and Plan: 1.Lefthemiparesis, dysphagia, and visual-spatial deficitssecondary to right MCA infarction --Continue CIR therapies including PT, OT, and SLP    -PRAFO LLE 2. Antithrombotics: -DVT/anticoagulation:Eliquison board -antiplatelet therapy: N/A 3. Pain Management:Tylenol as needed- added topamax for HA with some benefit -continue muscle rub for left shoulder -maintain proper shoulder position and support when in bed and chair. Discussed with patient again today 4.  Mood:Provide emotional support -antipsychotic agents: N/A 5. Neuropsych: This patientiscapable of making decisions on herown behalf. 6. Skin/Wound Care:Routine skin checks. Santylleft buttock daily covered with foam dressing. 7. Fluids/Electrolytes/Nutrition:Routine in and outs with follow-up chemistrieson admit, low albumin 2.6 monitor encourage p.o. intake, add pro-stat 8.Atrial fibrillation. Amiodarone 200 mg twice daily, Lanoxin 0.125 mg daily, Cardizem 90 mg 4 times daily. Cardiac rate controlled. Follow cardiology services Vitals:   10/02/18 1916 10/03/18 0324  BP: (!) 116/57 123/68  Pulse: 74 64  Resp: 19 16  Temp: 97.7 F (36.5 C) 98 F (36.7 C)  SpO2: 100% 100%  HR controlled 7/19, blood pressure is normal  9. Dysphagia. Dysphagia #2, thin liquids -encourage fluids, no signs of PNA  10. Diabetes mellitus. Hemoglobin A1c 6.1. SSI. Patient on Glucophage 500 mg twice daily prior to admission. Resume as indicated 11.Rhabdomyolysis. no sign of renal failure  12. Hyperlipidemia. Lipitor 13. Constipation. Laxative assistance  14.  Leukocytosis not febrile, ua equivocal, ucx multispecies  11.4K improving LOS: 6 days A FACE TO FACE EVALUATION WAS PERFORMED  Erick Colacendrew E Laytoya Ion 10/03/2018, 10:49 AM

## 2018-10-04 ENCOUNTER — Inpatient Hospital Stay (HOSPITAL_COMMUNITY): Payer: Medicare Other

## 2018-10-04 ENCOUNTER — Inpatient Hospital Stay (HOSPITAL_COMMUNITY): Payer: Medicare Other | Admitting: Occupational Therapy

## 2018-10-04 ENCOUNTER — Inpatient Hospital Stay (HOSPITAL_COMMUNITY): Payer: Medicare Other | Admitting: Physical Therapy

## 2018-10-04 LAB — URINE CULTURE: Culture: <10000 — AB

## 2018-10-04 LAB — GLUCOSE, CAPILLARY
Glucose-Capillary: 120 mg/dL — ABNORMAL HIGH (ref 70–99)
Glucose-Capillary: 110 mg/dL — ABNORMAL HIGH (ref 70–99)
Glucose-Capillary: 98 mg/dL (ref 70–99)
Glucose-Capillary: 107 mg/dL — ABNORMAL HIGH (ref 70–99)

## 2018-10-04 MED ORDER — TOPIRAMATE 25 MG PO TABS
25.0000 mg | ORAL_TABLET | Freq: Two times a day (BID) | ORAL | Status: DC
  Administered 2018-10-04 – 2018-11-01 (×56): 25 mg via ORAL
  Filled 2018-10-04 (×56): qty 1

## 2018-10-04 NOTE — Progress Notes (Signed)
Physical Therapy Session Note  Patient Details  Name: Rachel Fox MRN: [id number] Date of Birth: [birth year]  Today's Date: 10/04/2018 PT Individual Time: 0800-0856 PT Individual Time Calculation (min): 56 min   Short Term Goals: Week 1:  PT Short Term Goal 1 (Week 1): Pt will perform bed mobility with min assist PT Short Term Goal 2 (Week 1): Pt will perform bed<>chair transfer with mod assist PT Short Term Goal 3 (Week 1): Pt will initiate gait training  Skilled Therapeutic Interventions/Progress Updates:   Pt received in w/c and eating breakfast w/ NT, no c/o pain today. Supervision assist to continue eating breakfast. Min cues to swallow before attempting to speak and to attend to L side of tray. Pt adamant about bathing prior to participating in therapies. Pt agreeable to this therapist helping her. Bathed at w/c level at sink w/ mod-max assist overall for thoroughness and manual, visual, and verbal cues for technique and utilizing LUE. Min cues for midline in static sitting. Max assist sit<>stands while therapist provided total assist to wash buttocks. Stedy transfer to toilet w/ min assist for midline orientation while standing in stedy, total assist for brief management and for pericare. Mod assist to don shirt and pants, total assist to don socks. Ended session in w/c, all needs in reach.   Therapy Documentation Precautions:  Precautions Precautions: Fall Precaution Comments: L side weak/flaccid, right gaze preference, L shoulder pain (risk for subluxation), pressure injury from laying on floor PTA Restrictions Weight Bearing Restrictions: No Vital Signs:    Therapy/Group: Individual Therapy  Zaveon Gillen K Apphia Cropley 10/04/2018, 11:58 AM

## 2018-10-04 NOTE — Progress Notes (Signed)
[place] PHYSICAL MEDICINE & REHABILITATION PROGRESS NOTE   Subjective/Complaints:  No issues overnite , up with PT doing ADLs Review of systems difficult to obtained due to cognition poor awareness of deficits.   Objective:   No results found. No results for input(s): WBC, HGB, HCT, PLT in the last 72 hours. No results for input(s): NA, K, CL, CO2, GLUCOSE, BUN, CREATININE, CALCIUM in the last 72 hours.  Intake/Output Summary (Last 24 hours) at 10/04/2018 0814 Last data filed at 10/04/2018 0453 Gross per 24 hour  Intake 740 ml  Output 850 ml  Net -110 ml     Physical Exam: Vital Signs Blood pressure 124/61, pulse 62, temperature 97.9 F (36.6 C), temperature source Oral, resp. rate 17, height 5\' 3"  (1.6 m), weight 72.5 kg, SpO2 99 %.   General: No acute distress Mood and affect are appropriate Heart: Regular rate and rhythm no rubs murmurs or extra sounds Lungs: Clear to auscultation, breathing unlabored, no rales or wheezes Abdomen: Positive bowel sounds, soft nontender to palpation, nondistended Extremities: No clubbing, cyanosis, or edema Skin: Dressing on left buttocks decubitus   Left neglect noted  Neurologic: Cranial nerves II through XII intact, motor strength is 5/5 in right deltoid, bicep, tricep, grip, hip flexor, knee extensors, ankle dorsiflexor and plantar flexor 0/5 left upper extremity deltoid bicep tricep grip Trace hip knee extensor synergy in the left lower extremity and aduction  Sensation absent light touch in the left upper extremity Intact light touch left lower extremity  Cerebellar exam unable to perform on left side due to weakness musculoskeletal: Full range of motion in all 4 extremities. No joint swelling  Assessment/Plan: 1. Functional deficits secondary to right MCA infarct which require 3+ hours per day of interdisciplinary therapy in a comprehensive inpatient rehab setting.  Physiatrist is providing close team supervision and 24  hour management of active medical problems listed below.  Physiatrist and rehab team continue to assess barriers to discharge/monitor patient progress toward functional and medical goals  Care Tool:  Bathing    Body parts bathed by patient: Chest, Abdomen, Right upper leg, Face, Front perineal area, Left upper leg   Body parts bathed by helper: Right arm, Buttocks, Right lower leg, Left lower leg, Left arm     Bathing assist Assist Level: Maximal Assistance - Patient 24 - 49%     Upper Body Dressing/Undressing Upper body dressing   What is the patient wearing?: Pull over shirt    Upper body assist Assist Level: Maximal Assistance - Patient 25 - 49%    Lower Body Dressing/Undressing Lower body dressing      What is the patient wearing?: Underwear/pull up, Pants     Lower body assist Assist for lower body dressing: Maximal Assistance - Patient 25 - 49%     Toileting Toileting    Toileting assist Assist for toileting: Total Assistance - Patient < 25%(using Stedy)     Transfers Chair/bed transfer  Transfers assist  Chair/bed transfer activity did not occur: Safety/medical concerns  Chair/bed transfer assist level: Maximal Assistance - Patient 25 - 49%     Locomotion Ambulation   Ambulation assist   Ambulation activity did not occur: Safety/medical concerns          Walk 10 feet activity   Assist  Walk 10 feet activity did not occur: Safety/medical concerns        Walk 50 feet activity   Assist Walk 50 feet with 2 turns activity did not occur: Safety/medical  concerns         Walk 150 feet activity   Assist Walk 150 feet activity did not occur: Safety/medical concerns         Walk 10 feet on uneven surface  activity   Assist Walk 10 feet on uneven surfaces activity did not occur: Safety/medical concerns         Wheelchair     Assist Will patient use wheelchair at discharge?: Yes Type of Wheelchair: Manual    Wheelchair  assist level: Minimal Assistance - Patient > 75% Max wheelchair distance: 50 ft    Wheelchair 50 feet with 2 turns activity    Assist        Assist Level: Minimal Assistance - Patient > 75%   Wheelchair 150 feet activity     Assist Wheelchair 150 feet activity did not occur: Safety/medical concerns        Medical Problem List and Plan: 1.Lefthemiparesis, dysphagia, and visual-spatial deficitssecondary to right MCA infarction --Continue CIR therapies including PT, OT, and SLP    -PRAFO LLE 2. Antithrombotics: -DVT/anticoagulation:Eliquison board -antiplatelet therapy: N/A 3. Pain Management:Tylenol as needed- added topamax for HA with some benefit increased to BID  -continue muscle rub for left shoulder -maintain proper shoulder position and support when in bed and chair. Discussed with patient again today 4. Mood:Provide emotional support -antipsychotic agents: N/A 5. Neuropsych: This patientiscapable of making decisions on herown behalf. 6. Skin/Wound Care:Routine skin checks. Santylleft buttock daily covered with foam dressing. 7. Fluids/Electrolytes/Nutrition:Routine in and outs with follow-up chemistrieson admit, low albumin 2.6 monitor encourage p.o. intake, add pro-stat 8.Atrial fibrillation. Amiodarone 200 mg twice daily, Lanoxin 0.125 mg daily, Cardizem 90 mg 4 times daily. Cardiac rate controlled. Follow cardiology services Vitals:   10/03/18 2000 10/04/18 0447  BP: 118/60 124/61  Pulse: (!) 56 62  Resp: 18 17  Temp: 98.1 F (36.7 C) 97.9 F (36.6 C)  SpO2: 100% 99%  HR controlled 7/20 on low side, blood pressure is normal  9. Dysphagia. Dysphagia #2, thin liquids -encourage fluids, no signs of PNA  10. Diabetes mellitus. Hemoglobin A1c 6.1. SSI. Patient on Glucophage 500 mg twice daily prior to admission. Resume as indicated  11.Rhabdomyolysis. no sign of renal failure  12. Hyperlipidemia. Lipitor 13. Constipation. Laxative assistance  14.  Leukocytosis not febrile, ua equivocal, ucx multispecies  11.4K improving LOS: 7 days A FACE TO FACE EVALUATION WAS PERFORMED  Erick Colacendrew E Claribel Sachs 10/04/2018, 8:14 AM

## 2018-10-04 NOTE — Progress Notes (Signed)
Occupational Therapy Session Note  Patient Details  Name: Rachel Fox MRN: [id number] Date of Birth: [birth year]  Today's Date: 10/04/2018 OT Individual Time: 1000-1055 OT Individual Time Calculation (min): 55 min   Short Term Goals: Week 1:  OT Short Term Goal 1 (Week 1): Pt will perform toileting with max A of 1. OT Short Term Goal 2 (Week 1): Pt will perform UB dressing with mod A. OT Short Term Goal 3 (Week 1): Pt will maintain sitting balance during self care with min A overall.  Skilled Therapeutic Interventions/Progress Updates:    Pt greeted with Conservation officer, historic buildings. Just toileted using Stedy. Per PT, she already bathed this AM during therapy. Pt wanted to brush her teeth. Sit<stand with Max A and Max A for balance due to Lt lean and active Lt knee buckling. Manual facilitation for ensuring Lt foot was contacting floor prior to power up. Manual and verbal cuing for obtaining midline with use of mirror for visual feedback. Pt able to improve alignment by pulling herself towards Rt side of sink. HOH for incorporating L UE as gross stabilizer and for facilitation of weightbearing on sink. She stood for 6 minutes while completing oral care with min vcs to find needed items on her Lt side. After sitting for a break, she stood again to wash her hands. The same assist required for standing balance. Pt initiated involving her Lt limb to wash under the water. When she sat back in the w/c, pt c/o having adhesive glue on her arms. OT escorted her to the supply closet via w/c to gather universal wipes and also new gripper socks. Pt able to state her room number, but required vcs for her to scan to Lt side to read placard beside door. Unable to recognize her room until she was escorted inside of it. Worked on Lt attention by having pt use universal wipes to remove glue from her Lt arm, and then apply lotion. HOH for using Lt hand to apply lotion to Rt arm. Max A for achieving modified figure 4 to doff Lt sock.  Unmodified figure 4 was too painful. She needed A to apply lotion to lower leg. Able to don new gripper sock 25% herself once she was started off at the toes. Pt had no issue with positioning her Rt LE into figure 4, but required Min A to maintain position. Assist for applying lotion to foot, though she was able to do her upper leg. Able to don gripper sock 40% of the way with increased time and CGA for balance. She then wanted to return to bed due to buttocks pain. Max A squat pivot>bed and to return to supine. Pt was repositioned in sidelying for pressure relief and pain mgt. Provided her with ice pack to forehead to address HA. RN made aware to check on pt in 30 minutes to remove ice pack if necessary. Pt left in bed with all needs within reach and bed alarm set.   Therapy Documentation Precautions:  Precautions Precautions: Fall Precaution Comments: L side weak/flaccid, right gaze preference, L shoulder pain (risk for subluxation), pressure injury from laying on floor PTA Restrictions Weight Bearing Restrictions: No Vital Signs: Therapy Vitals Temp: 97.7 F (36.5 C) Temp Source: Oral Pulse Rate: 76 Resp: 19 BP: 122/70 Patient Position (if appropriate): Sitting Oxygen Therapy SpO2: 100 % O2 Device: Room Air Pain: Pain Assessment Pain Score: 0-No pain ADL:       Therapy/Group: Individual Therapy  Rachel Fox 10/04/2018, 4:04 PM

## 2018-10-04 NOTE — Progress Notes (Addendum)
Speech Language Pathology Daily Session Note  Patient Details  Name: Rachel Fox MRN: [id number] Date of Birth: 1946-03-07  Today's Date: 10/04/2018 SLP Individual Time: 1230-1330 SLP Individual Time Calculation (min): 60 min  Short Term Goals: Week 1: SLP Short Term Goal 1 (Week 1): Patient will consume trials of Dys. 2 textures with efficient mastication and complete oral clearance without overt s/s of aspiration with Min verbal cues for use of swallowing compensatory strategies. SLP Short Term Goal 2 (Week 1): Pt will consume thin liquids with minimal overt s/s of aspiration and Min A cues for use of compensatory swallow strategies. SLP Short Term Goal 2 - Progress (Week 1): Updated due to goal met SLP Short Term Goal 3 (Week 1): Patient will demonstrate functional problem solving for basic and familiar tasks with Mod A verbal cues. SLP Short Term Goal 4 (Week 1): Patient will utilize memory compensatory strategies to recall new, daily information with Mod A verbal cues. SLP Short Term Goal 5 (Week 1): Patient will self-monitor and correct errors during functional tasks with Mod verbal and visual cues. SLP Short Term Goal 6 (Week 1): Patient will utilize speech intelligibility strategies at the sentence level with Mod A verbal cues to achieve ~80% intelligibility.  Skilled Therapeutic Interventions: Skilled ST services focused on swallow and cognitive skills. Pt was orientated to place, situation and time, requiring cues foe day/date only.SLP facilitated PO consumption of dys 2 and thin diet lunch tray, pt required min A verbal cues for awareness of left anterior spillage cues and supervision A verbal cues for recall and use of swallow strategies. Pt required mod A verbal cues for recall of today's therapy events. Pt requested to used bathroom, nurse and SLP assisted with stedy and returned to bed. SLP also facilitated basic problem solving and error awareness skills with 3-4 step card  sequencing task, pt demonstrated ability to sequence 3 step mod I and 4 step in 3 out 6 opportunities for problem solving and mod A verbal cues for error awareness. Pt was left in room with call bell within reach and bed alarm set. ST recommends to continue skilled ST services.      Pain Pain Assessment Pain Score: 0-No pain  Therapy/Group: Individual Therapy  Kendell Sagraves  [facility] 10/04/2018, 1:41 PM

## 2018-10-04 NOTE — Progress Notes (Signed)
Physical Therapy Session Note  Patient Details  Name: Rachel Fox MRN: [id number] Date of Birth: February 06, 1946  Today's Date: 10/04/2018 PT Individual Time: 4270-6237 PT Individual Time Calculation (min): 27 min   Short Term Goals: Week 1:  PT Short Term Goal 1 (Week 1): Pt will perform bed mobility with min assist PT Short Term Goal 2 (Week 1): Pt will perform bed<>chair transfer with mod assist PT Short Term Goal 3 (Week 1): Pt will initiate gait training  Skilled Therapeutic Interventions/Progress Updates:  Pt received asleep in bed but awakened & agreeable to tx. Pt transfers to sitting EOB with therapist attempting to instruct in compensatory technique for BLE but poor attention to instructions & pt requiring mod assist to transfer BLE to EOB but pt able to upright trunk with HOB elevated & bed rails. Pt transfers bed>w/c with stedy lift with mod assist for sit>stand. Transported pt in w/c dependent assist for time management. Pt transfers sit>stand at rail in hallway with mod assist and ambulates 30 ft with mod assist + w/c follow for safety with pt able to initiate advancing LLE with assistance to step, tactile cuing at L knee to prevent buckling but only one occurrence of this at end of gait, multimodal cuing for upright & midline posture as pt will begin to lean/slightly push L and cuing for gait pattern. Pt set up at dynavision, laterally to board, with lights off and task focusing on forced scanning to L visual field to locate & push lights (2 trials x 2 minutes each), also focusing on anterior weight shifting & trunk control for reaching to lights. At end of session pt left in w/c with chair alarm donned & call bell in lap.   Therapy Documentation Precautions:  Precautions Precautions: Fall Precaution Comments: L side weak/flaccid, right gaze preference, L shoulder pain (risk for subluxation), pressure injury from laying on floor PTA Restrictions Weight Bearing Restrictions:  No   Pain: No c/o pain reported.    Therapy/Group: Individual Therapy  Waunita Schooner 10/04/2018, 2:43 PM

## 2018-10-05 ENCOUNTER — Inpatient Hospital Stay (HOSPITAL_COMMUNITY): Payer: Medicare Other | Admitting: Occupational Therapy

## 2018-10-05 ENCOUNTER — Inpatient Hospital Stay (HOSPITAL_COMMUNITY): Payer: Medicare Other | Admitting: Physical Therapy

## 2018-10-05 ENCOUNTER — Inpatient Hospital Stay (HOSPITAL_COMMUNITY): Payer: Medicare Other

## 2018-10-05 LAB — GLUCOSE, CAPILLARY
Glucose-Capillary: 131 mg/dL — ABNORMAL HIGH (ref 70–99)
Glucose-Capillary: 126 mg/dL — ABNORMAL HIGH (ref 70–99)
Glucose-Capillary: 92 mg/dL (ref 70–99)
Glucose-Capillary: 165 mg/dL — ABNORMAL HIGH (ref 70–99)

## 2018-10-05 MED ORDER — OXYBUTYNIN CHLORIDE 5 MG PO TABS
2.5000 mg | ORAL_TABLET | Freq: Every day | ORAL | Status: DC
  Administered 2018-10-05 – 2018-10-18 (×14): 2.5 mg via ORAL
  Filled 2018-10-05 (×14): qty 1

## 2018-10-05 NOTE — Progress Notes (Signed)
Physical Therapy Session Note  Patient Details  Name: Rachel Fox MRN: [id number] Date of Birth: 12-28-1945  Today's Date: 10/05/2018 PT Individual Time: 1035-1130 PT Individual Time Calculation (min): 55 min   Short Term Goals: Week 1:  PT Short Term Goal 1 (Week 1): Pt will perform bed mobility with min assist PT Short Term Goal 2 (Week 1): Pt will perform bed<>chair transfer with mod assist PT Short Term Goal 3 (Week 1): Pt will initiate gait training  Skilled Therapeutic Interventions/Progress Updates:  Provided pt with TIS w/c with roho cushion to prevent skin breakdown but promote OOB tolerance as pt with pressure wound on buttocks. Pt received in bed & agreeable to tx. Pt transfers to sitting EOB with cuing to use RLE to assist LLE to EOB and min assist to upright trunk but pt able to push with RUE, bed rails & HOB slightly elevated. Pt reports need to use restroom and transfers sit<>stand with min/mod assist in stedy and is transported in/out of bathroom. Pt requires total assist for clothing management & pt with continent void on toilet, performing peri hygiene with cuing to attempt. Pt performs hand hygiene at sink while sitting in stedy with min assist. Transported pt to gym via w/c dependent assist. Pt transfers w/c<>mat table with max assist with max cuing for stand pivot sequencing and technique. From EOM pt transfers sit<>stand with max assist with max cuing for hand placement, anterior weight shifting, uprighting trunk/posture and activating B hip extensors. Provided pt with RW & L hand splint & pt stands with AD and engages in pre gait stepping forwards/backwards with RLE with therapist attempting to use mirror for visual feedback but poor use by pt, therapist providing blocking at L knee to prevent buckling, max multimodal cuing for upright posture & to correct L lateral lean. Pt then engages in standing without AD but max assist while reaching overhead/to R to promote weight  shifting to R as pt pushes when she has RUE on RW during previous activity. Adjusted TIS leg rests to promote optimal posture & provided pt with boosting schedule - RN Erline Levine) made aware. Pt requested something to drink so provided supervision while pt consumed liquids. At end of session pt left reclined in TIS w/c with chair alarm donned & call bell in lap.   Therapy Documentation Precautions:  Precautions Precautions: Fall Precaution Comments: L side weak/flaccid, right gaze preference, L shoulder pain (risk for subluxation), pressure injury from laying on floor PTA Restrictions Weight Bearing Restrictions: No  Pain:  Pt with c/o unrated L shoulder pain & repositioning & rest provided. Pt also c/o HA at end of session but vitals WNL & pt reports she's been having HA since she had stroke.    Therapy/Group: Individual Therapy  Waunita Schooner 10/05/2018, 11:50 AM

## 2018-10-05 NOTE — Progress Notes (Signed)
[facility] PHYSICAL MEDICINE & REHABILITATION PROGRESS NOTE   Subjective/Complaints:  Per RN some tunneling of Left buttocks decub will need ribbon to pack   Review of systems difficult to obtained due to cognition poor awareness of deficits.   Objective:   No results found. No results for input(s): WBC, HGB, HCT, PLT in the last 72 hours. No results for input(s): NA, K, CL, CO2, GLUCOSE, BUN, CREATININE, CALCIUM in the last 72 hours.  Intake/Output Summary (Last 24 hours) at 10/05/2018 0840 Last data filed at 10/04/2018 1800 Gross per 24 hour  Intake 360 ml  Output -  Net 360 ml     Physical Exam: Vital Signs Blood pressure (!) 109/58, pulse 63, temperature 98 F (36.7 C), temperature source Oral, resp. rate 15, height 5\' 3"  (1.6 m), weight 72.5 kg, SpO2 100 %.   General: No acute distress Mood and affect are appropriate Heart: Regular rate and rhythm no rubs murmurs or extra sounds Lungs: Clear to auscultation, breathing unlabored, no rales or wheezes Abdomen: Positive bowel sounds, soft nontender to palpation, nondistended Extremities: No clubbing, cyanosis, or edema Skin: Dressing on left buttocks decubitus   Left neglect noted  Neurologic: Cranial nerves II through XII intact, motor strength is 5/5 in right deltoid, bicep, tricep, grip, hip flexor, knee extensors, ankle dorsiflexor and plantar flexor 0/5 left upper extremity deltoid bicep tricep grip Trace hip knee extensor synergy in the left lower extremity and aduction  Sensation absent light touch in the left upper extremity Intact light touch left lower extremity  Cerebellar exam unable to perform on left side due to weakness musculoskeletal: Full range of motion in all 4 extremities. No joint swelling  Assessment/Plan: 1. Functional deficits secondary to right MCA infarct which require 3+ hours per day of interdisciplinary therapy in a comprehensive inpatient rehab setting.  Physiatrist is providing close  team supervision and 24 hour management of active medical problems listed below.  Physiatrist and rehab team continue to assess barriers to discharge/monitor patient progress toward functional and medical goals  Care Tool:  Bathing    Body parts bathed by patient: Left arm, Abdomen, Chest, Front perineal area, Left upper leg, Right upper leg, Face   Body parts bathed by helper: Right arm, Buttocks, Right lower leg, Left lower leg     Bathing assist Assist Level: Moderate Assistance - Patient 50 - 74%     Upper Body Dressing/Undressing Upper body dressing   What is the patient wearing?: Pull over shirt    Upper body assist Assist Level: Maximal Assistance - Patient 25 - 49%    Lower Body Dressing/Undressing Lower body dressing      What is the patient wearing?: Pants, Incontinence brief     Lower body assist Assist for lower body dressing: Maximal Assistance - Patient 25 - 49%     Toileting Toileting    Toileting assist Assist for toileting: Total Assistance - Patient < 25%(using Stedy)     Transfers Chair/bed transfer  Transfers assist  Chair/bed transfer activity did not occur: Safety/medical concerns  Chair/bed transfer assist level: Dependent - mechanical lift     Locomotion Ambulation   Ambulation assist   Ambulation activity did not occur: Safety/medical concerns  Assist level: 2 helpers Assistive device: (rail in hallway) Max distance: 30 ft   Walk 10 feet activity   Assist  Walk 10 feet activity did not occur: Safety/medical concerns  Assist level: 2 helpers Assistive device: (rail)   Walk 50 feet activity  Assist Walk 50 feet with 2 turns activity did not occur: Safety/medical concerns         Walk 150 feet activity   Assist Walk 150 feet activity did not occur: Safety/medical concerns         Walk 10 feet on uneven surface  activity   Assist Walk 10 feet on uneven surfaces activity did not occur: Safety/medical  concerns         Wheelchair     Assist Will patient use wheelchair at discharge?: Yes Type of Wheelchair: Manual    Wheelchair assist level: Minimal Assistance - Patient > 75% Max wheelchair distance: 50 ft    Wheelchair 50 feet with 2 turns activity    Assist        Assist Level: Minimal Assistance - Patient > 75%   Wheelchair 150 feet activity     Assist Wheelchair 150 feet activity did not occur: Safety/medical concerns        Medical Problem List and Plan: 1.Lefthemiparesis, dysphagia, and visual-spatial deficitssecondary to right MCA infarction --Continue CIR therapies including PT, OT, and SLP Team conf in am    -PRAFO LLE 2. Antithrombotics: -DVT/anticoagulation:Eliquison board -antiplatelet therapy: N/A 3. Pain Management:Tylenol as needed- added topamax for HA with some benefit increased to BID- no HA   -continue muscle rub for left shoulder -maintain proper shoulder position and support when in bed and chair. Discussed with patient again today 4. Mood:Provide emotional support -antipsychotic agents: N/A 5. Neuropsych: This patientiscapable of making decisions on herown behalf. 6. Skin/Wound Care:Routine skin checks. Santylleft buttock daily covered with foam dressing. 7. Fluids/Electrolytes/Nutrition:Routine in and outs with follow-up chemistrieson admit, low albumin 2.6 monitor encourage p.o. intake, add pro-stat 8.Atrial fibrillation. Amiodarone 200 mg twice daily, Lanoxin 0.125 mg daily, Cardizem 90 mg 4 times daily. Cardiac rate controlled. Follow cardiology services Vitals:   10/04/18 2034 10/05/18 0307  BP: 104/68 (!) 109/58  Pulse: 79 63  Resp: 15 15  Temp: 98.2 F (36.8 C) 98 F (36.7 C)  SpO2: 100% 100%  HR controlled 7/21 on low side, blood pressure is normal  9. Dysphagia. Dysphagia #2, thin liquids -encourage fluids, no  signs of PNA  10. Diabetes mellitus. Hemoglobin A1c 6.1. SSI. Patient on Glucophage 500 mg twice daily prior to admission. Resume as indicated 11.Rhabdomyolysis. no sign of renal failure  12. Hyperlipidemia. Lipitor 13. Constipation. Laxative assistance  14.  Leukocytosis not febrile, ua equivocal, ucx multispecies  11.4K improving LOS: 8 days A FACE TO FACE EVALUATION WAS PERFORMED  Erick Colacendrew E Nerissa Constantin 10/05/2018, 8:40 AM

## 2018-10-05 NOTE — Progress Notes (Signed)
Speech Language Pathology Weekly Progress and Session Note  Patient Details  Name: Rachel Fox MRN: [id number] Date of Birth: Jul 23, 1945  Beginning of progress report period: September 28, 2018 End of progress report period: October 05, 2018  Today's Date: 10/05/2018 SLP Individual Time: 0803-0900 SLP Individual Time Calculation (min): 57 min  Short Term Goals: Week 1: SLP Short Term Goal 1 (Week 1): Patient will consume trials of Dys. 2 textures with efficient mastication and complete oral clearance without overt s/s of aspiration with Min verbal cues for use of swallowing compensatory strategies. SLP Short Term Goal 1 - Progress (Week 1): Met SLP Short Term Goal 2 (Week 1): Pt will consume thin liquids with minimal overt s/s of aspiration and Min A cues for use of compensatory swallow strategies. SLP Short Term Goal 2 - Progress (Week 1): Met SLP Short Term Goal 3 (Week 1): Patient will demonstrate functional problem solving for basic and familiar tasks with Mod A verbal cues. SLP Short Term Goal 3 - Progress (Week 1): Met SLP Short Term Goal 4 (Week 1): Patient will utilize memory compensatory strategies to recall new, daily information with Mod A verbal cues. SLP Short Term Goal 4 - Progress (Week 1): Met SLP Short Term Goal 5 (Week 1): Patient will self-monitor and correct errors during functional tasks with Mod verbal and visual cues. SLP Short Term Goal 5 - Progress (Week 1): Met SLP Short Term Goal 6 (Week 1): Patient will utilize speech intelligibility strategies at the sentence level with Mod A verbal cues to achieve ~80% intelligibility. SLP Short Term Goal 6 - Progress (Week 1): Not met    New Short Term Goals: Week 2: SLP Short Term Goal 1 (Week 2): Patient will consume Dys. 2 textures and thin liquids via cup with efficient mastication and complete oral clearance without overt s/s of aspiration with Supervision verbal cues for use of swallowing compensatory strategies. SLP  Short Term Goal 2 (Week 2): Patient will demonstrate functional problem solving for basic and familiar tasks with Min A verbal cues. SLP Short Term Goal 3 (Week 2): Patient will utilize memory compensatory strategies to recall new, daily information with Min A verbal cues. SLP Short Term Goal 4 (Week 2): Patient will self-monitor and correct errors during functional tasks with Min verbal and visual cues. SLP Short Term Goal 5 (Week 2): Patient will utilize speech intelligibility strategies at the sentence level with Mod A verbal cues to achieve ~80% intelligibility.  Weekly Progress Updates: Pt met 5 out 6 goals this reporting period. Pt demonstrated moderate deficits in basic problem solving, error awareness and short term recall in basic money management task and during ADLs, however limited time spent on cognitive deficits this reported period due to swallowing goals. Pt participated in [facility] on 7/17 upgrading pt to dys 2 textures and thin liquids via cup (slient aspiration noted via straw). Pt requires min A verbal cues to clear left side buccal pocketing and can verbalize swallow strategies. Pt continues to require mod A verbal cues to increase vocal intensity at sentence level with reduced cognitive awareness. SLP will focus on increasing vocal intensity/awareness, cognitive and swallowing deficits. Pt would continue benefit from skilled ST services in order to maximize functional independence and reduce burden of care, requiring 24 hour supervision and continue ST services.     Intensity: Minumum of 1-2 x/day, 30 to 90 minutes Frequency: 3 to 5 out of 7 days Duration/Length of Stay: 3-4 weeks Treatment/Interventions: Cognitive remediation/compensation;Dysphagia/aspiration precaution training;Internal/external  aids;Speech/Language facilitation;Therapeutic Activities;Environmental controls;Cueing hierarchy;Functional tasks;Patient/family education   Daily Session  Skilled Therapeutic  Interventions: Skilled ST services focused on swallow and cognitive skills. SLP facilitated basic and functional problem solving skills with money management task, pt required min A cues to display requested amount/count and make simple change and required mod A verbal cues for error awareness and recall. Pt requested to use bathroom, NT and SLP assisted via stedy and returned to bed. SLP also facilitated PO consumption of breakfast trray dys 2 and thin via cup, pt recalled swallow strategies and required min A verbal cues to clear left buccal pocketing with lingual and finger sweeps. Pt was left in room with nurse. ST recommends to continue skilled ST services.      General    Pain Pain Assessment Pain Score: 0-No pain  Therapy/Group: Individual Therapy  Blase Beckner  [facility] 10/05/2018, 3:41 PM

## 2018-10-05 NOTE — Progress Notes (Signed)
Patients son was called to inform him about pressure injury. There was no answer, a message was left for a return call. Present nurse made aware,

## 2018-10-05 NOTE — Progress Notes (Signed)
Occupational Therapy Session Note  Patient Details  Name: Rachel Fox MRN: [id number] Date of Birth: 1945-03-28  Today's Date: 10/05/2018 OT Individual Time: 4315-4008 OT Individual Time Calculation (min): 72 min   Short Term Goals: Week 1:  OT Short Term Goal 1 (Week 1): Pt will perform toileting with max A of 1. OT Short Term Goal 2 (Week 1): Pt will perform UB dressing with mod A. OT Short Term Goal 3 (Week 1): Pt will maintain sitting balance during self care with min A overall.  Skilled Therapeutic Interventions/Progress Updates:    Pt greeted semi-reclined in bed and agreeable to OT treatment session. Pt reported need to go to the bathroom. Pt needed max A to come to sitting EOB. Pt able to stand with Stedy with min A! Pt brought into bathroom and onto commode. Pt voided bladder and worked on toileting in standing with mod A for balance and verbal cues to correct lateral lean to the L. Worked on  B hand washing task with hand over hand A to bring L UE up to the sink 2/2 increased flexor tone. Pt transitioned to TIS wc via stedy and min A. Worked on self-feeding and L visual scanning to locate food items.  Worked on UB bathing/dressing from wc at the sink with OT providing hand over hand A with L UE for neuro re-ed. Reviewed hemi dressing techniques with mod A overall 2.2  L UE tone. Pt left seated in TIS wc with alarm belt on and handoff to Rec Therapist for zoom call with family. Call bell in reach.   Therapy Documentation Precautions:  Precautions Precautions: Fall Precaution Comments: L side weak/flaccid, right gaze preference, L shoulder pain (risk for subluxation), pressure injury from laying on floor PTA Restrictions Weight Bearing Restrictions: No Pain: Pain Assessment Pain Score: 0-No pain   Therapy/Group: Individual Therapy  Valma Cava 10/05/2018, 1:49 PM

## 2018-10-05 NOTE — Progress Notes (Signed)
Occupational Therapy Session Note  Patient Details  Name: Rachel Fox MRN: [id number] Date of Birth: 04/07/1945  Today's Date: 10/05/2018 OT Individual Time: 0930-1000 OT Individual Time Calculation (min): 30 min    Short Term Goals: Week 1:  OT Short Term Goal 1 (Week 1): Pt will perform toileting with max A of 1. OT Short Term Goal 2 (Week 1): Pt will perform UB dressing with mod A. OT Short Term Goal 3 (Week 1): Pt will maintain sitting balance during self care with min A overall.      Skilled Therapeutic Interventions/Progress Updates:    Pt received in bed and requested to toilet.  She was able to roll onto her R side with min A and sidelying to sit with mod A. Pt able to sit to EOB (on air mattress) with S.  Sit to stand in stedy with only min A/CGA and transferred to [facility] over toilet.  Excellent job with forward leaning to have a controlled sit >< stand.  Pt unable to void.  Donned pull up briefs and pants for pt with total A over feet, then she stood with min A in stedy for therapist to adjust pants over hips.    Pt requested to return to bed due to her bottom wound.  Pt transferred to bed and placed on R side with pillows supporting her back, between legs and under L arm. Moderate tone in L arm.    Pt in bed with alarm set and all needs met.   Therapy Documentation Precautions:  Precautions Precautions: Fall Precaution Comments: L side weak/flaccid, right gaze preference, L shoulder pain (risk for subluxation), pressure injury from laying on floor PTA Restrictions Weight Bearing Restrictions: No  Pain: Pain Assessment Pain Score: 0-No pain   Therapy/Group: Individual Therapy  [place] 10/05/2018, 12:43 PM

## 2018-10-05 NOTE — Progress Notes (Addendum)
Patient was toileted for the 3rd time tonight. She has had frequent urination as reported by previous nurses & was witnessed last night. When put back in bed, dressing to the left buttock was removed. The dressing was almost saturated with wound drainage & there is an odor that can be detected when patient is fully clothed & wound covered. The wound measures 8cm x 9 cm x 2.8cm & has approximately 70% slough tissue that is loose & a bit stringy. There is undermining from 2-4 oclock & from 7-10 oclock with the deepest point at 2 oclock measuring at 5.9cm deep There is no black eschar noted. Tissue is yellowish Maree Ainley. Drainage is serosanguinous & heavy. Wound was covered with a abd pad & gauze that was saturated. Continued the wound care orders for santyl & a moist to dry dressing, covered with an abd pad & secured with cloth tape. The surrounding area is dry & intact, no redness noted. On her left heel was a foam dressing that was also removed. She has a stage ! Pressure injury that measures 1.5 x 1.5 x 0cm. Foam drsg was replaced. She has on a prafo boot to that foot. She was turned on the right side at her request. She prefers this side due to pain from the wound. Concerned that she may get another wound for laying on that side so much, so an air mattress was ordered. Patient transfers with the aid of a stedy with 2 assists. She has decreased sensation& flaccidity on the left side. Interventions were explained to the patient & she verbalized understanding.Will continue to monitor. Communication put in for the provider with requests for a treatment change to add alginate to the wound to absorb the drainage & evaluation. No acute distress noted.

## 2018-10-06 ENCOUNTER — Inpatient Hospital Stay (HOSPITAL_COMMUNITY): Payer: Medicare Other | Admitting: Occupational Therapy

## 2018-10-06 ENCOUNTER — Inpatient Hospital Stay (HOSPITAL_COMMUNITY): Payer: Medicare Other | Admitting: Speech Pathology

## 2018-10-06 ENCOUNTER — Inpatient Hospital Stay (HOSPITAL_COMMUNITY): Payer: Medicare Other

## 2018-10-06 LAB — GLUCOSE, CAPILLARY
Glucose-Capillary: 129 mg/dL — ABNORMAL HIGH (ref 70–99)
Glucose-Capillary: 79 mg/dL (ref 70–99)
Glucose-Capillary: 105 mg/dL — ABNORMAL HIGH (ref 70–99)
Glucose-Capillary: 82 mg/dL (ref 70–99)
Glucose-Capillary: 113 mg/dL — ABNORMAL HIGH (ref 70–99)

## 2018-10-06 NOTE — Progress Notes (Signed)
Occupational Therapy Weekly Progress Note  Patient Details  Name: Rachel Fox MRN: [id number] Date of Birth: 01-Nov-1945  Beginning of progress report period: September 29, 2018 End of progress report period: October 06, 2018  Today's Date: 10/06/2018 OT Individual Time: 7412-8786 OT Individual Time Calculation (min): 75 min    Patient has met 3 of 3 short term goals.  Pt has improved with her trunk control, midline awareness, ability to forward weight shift to rise to stand.  Patient continues to demonstrate the following deficits: muscle weakness, decreased cardiorespiratoy endurance, abnormal tone, unbalanced muscle activation and decreased coordination, decreased midline orientation, decreased problem solving and delayed processing and decreased sitting balance, decreased standing balance, decreased postural control and hemiplegia and therefore will continue to benefit from skilled OT intervention to enhance overall performance with BADL.  Patient progressing toward long term goals..  Continue plan of care.  OT Short Term Goals Week 1:  OT Short Term Goal 1 (Week 1): Pt will perform toileting with max A of 1. OT Short Term Goal 1 - Progress (Week 1): Met OT Short Term Goal 2 (Week 1): Pt will perform UB dressing with mod A. OT Short Term Goal 2 - Progress (Week 1): Met OT Short Term Goal 3 (Week 1): Pt will maintain sitting balance during self care with min A overall. OT Short Term Goal 3 - Progress (Week 1): Met Week 2:  OT Short Term Goal 1 (Week 2): Pt will be able to complete a squat pivot transfer to the toilet with min A (vs using the stedy) OT Short Term Goal 2 (Week 2): Pt will be able to don shirt with min A. OT Short Term Goal 3 (Week 2): Pt will be able to bathe with min A. OT Short Term Goal 4 (Week 2): Pt will be able to sit to stand during LB self care with min A. OT Short Term Goal 5 (Week 2): Pt will be able to maintain stand balance with mod A while pulling pants up and  down with 25% or less A.  Skilled Therapeutic Interventions/Progress Updates:      Pt seen for BADL retraining of toileting, bathing, and dressing with a focus on postural control, midline awareness, hemi compensatory strategies. Pt received in bed and requested to toilet. She was able to roll to her R side with mod A using B bent knees and R arm supporting L arm. She then used R arm to push up to sit with min A.  Maintained balance with S at EOB.  Stood with stedy with min A and transferred onto toilet with min A.  In standing with min, pt used R hand to push underwear down completely.  In sitting she was able to cleanse both front and back with wt shift to L with mod A to support dynamic balance with the lean. Stood again with min A  To use R hand to pull underwear up with mod A.   She then transferred without the stedy lift to w/c her affected side using a squat pivot with min A.  Pt placed at sink to work on grooming with set up, UB bathing with min A, donning shirt with mod A. Sit to stand at sink with min-mod A with support on L side and bracing L knee.  Pt able to cleanse front perineal area, push underwear down, and then pull up pants with 50% A.  From sitting she washed thighs.  No significant lean in sitting, but  does have a lean in standing which she is able to correct with mod A and verbal cues.  Pt resting in regular wc on roho cushion with belt alarm on and all needs met.     Therapy Documentation Precautions:  Precautions Precautions: Fall Precaution Comments: L side weak/flaccid, right gaze preference, L shoulder pain (risk for subluxation), pressure injury from laying on floor PTA Restrictions Weight Bearing Restrictions: No       Pain: no c/o pain        Therapy/Group: Individual Therapy  [facility] 10/06/2018, 11:00 AM

## 2018-10-06 NOTE — Progress Notes (Signed)
[facility] PHYSICAL MEDICINE & REHABILITATION PROGRESS NOTE   Subjective/Complaints:  Wishes to be repositioned , no c/os  Review of systems difficult to obtained due to cognition poor awareness of deficits.   Objective:   No results found. No results for input(s): WBC, HGB, HCT, PLT in the last 72 hours. No results for input(s): NA, K, CL, CO2, GLUCOSE, BUN, CREATININE, CALCIUM in the last 72 hours.  Intake/Output Summary (Last 24 hours) at 10/06/2018 0753 Last data filed at 10/06/2018 0746 Gross per 24 hour  Intake 200 ml  Output -  Net 200 ml     Physical Exam: Vital Signs Blood pressure 117/68, pulse 69, temperature 98.1 F (36.7 C), temperature source Oral, resp. rate 18, height _0  (1.6 m), weight 72.5 kg, SpO2 100 %.   General: No acute distress Mood and affect are appropriate Heart: Regular rate and rhythm no rubs murmurs or extra sounds Lungs: Clear to auscultation, breathing unlabored, no rales or wheezes Abdomen: Positive bowel sounds, soft nontender to palpation, nondistended Extremities: No clubbing, cyanosis, or edema Skin: Dressing on left buttocks decubitus   Left neglect noted  Neurologic: Cranial nerves II through XII intact, motor strength is 5/5 in right deltoid, bicep, tricep, grip, hip flexor, knee extensors, ankle dorsiflexor and plantar flexor 0/5 left upper extremity deltoid bicep tricep grip Trace hip knee extensor synergy in the left lower extremity and aduction  Sensation absent light touch in the left upper extremity Intact light touch left lower extremity  Cerebellar exam unable to perform on left side due to weakness musculoskeletal: Full range of motion in all 4 extremities. No joint swelling  Assessment/Plan: 1. Functional deficits secondary to right MCA infarct which require 3+ hours per day of interdisciplinary therapy in a comprehensive inpatient rehab setting.  Physiatrist is providing close team supervision and 24 hour  management of active medical problems listed below.  Physiatrist and rehab team continue to assess barriers to discharge/monitor patient progress toward functional and medical goals  Care Tool:  Bathing    Body parts bathed by patient: Left arm, Abdomen, Chest, Front perineal area, Left upper leg, Right upper leg, Face   Body parts bathed by helper: Right arm, Buttocks, Right lower leg, Left lower leg     Bathing assist Assist Level: Moderate Assistance - Patient 50 - 74%     Upper Body Dressing/Undressing Upper body dressing   What is the patient wearing?: Pull over shirt    Upper body assist Assist Level: Maximal Assistance - Patient 25 - 49%    Lower Body Dressing/Undressing Lower body dressing      What is the patient wearing?: Pants, Incontinence brief     Lower body assist Assist for lower body dressing: Maximal Assistance - Patient 25 - 49%     Toileting Toileting    Toileting assist Assist for toileting: Total Assistance - Patient < 25%     Transfers Chair/bed transfer  Transfers assist  Chair/bed transfer activity did not occur: Safety/medical concerns  Chair/bed transfer assist level: Maximal Assistance - Patient 25 - 49%     Locomotion Ambulation   Ambulation assist   Ambulation activity did not occur: Safety/medical concerns  Assist level: 2 helpers Assistive device: (rail in hallway) Max distance: 30 ft   Walk 10 feet activity   Assist  Walk 10 feet activity did not occur: Safety/medical concerns  Assist level: 2 helpers Assistive device: (rail)   Walk 50 feet activity   Assist Walk 50 feet with  2 turns activity did not occur: Safety/medical concerns         Walk 150 feet activity   Assist Walk 150 feet activity did not occur: Safety/medical concerns         Walk 10 feet on uneven surface  activity   Assist Walk 10 feet on uneven surfaces activity did not occur: Safety/medical concerns         Wheelchair      Assist Will patient use wheelchair at discharge?: Yes Type of Wheelchair: Manual    Wheelchair assist level: Minimal Assistance - Patient > 75% Max wheelchair distance: 50 ft    Wheelchair 50 feet with 2 turns activity    Assist        Assist Level: Minimal Assistance - Patient > 75%   Wheelchair 150 feet activity     Assist Wheelchair 150 feet activity did not occur: Safety/medical concerns        Medical Problem List and Plan: 1.Lefthemiparesis, dysphagia, and visual-spatial deficitssecondary to right MCA infarction --Continue CIR therapies including PT, OT, and SLP Team conference today please see physician documentation under team conference tab, met with team face-to-face to discuss problems,progress, and goals. Formulized individual treatment plan based on medical history, underlying problem and comorbidities.    -PRAFO LLE 2. Antithrombotics: -DVT/anticoagulation:Eliquison board -antiplatelet therapy: N/A 3. Pain Management:Tylenol as needed- added topamax for HA with some benefit increased to BID- no HA   -continue muscle rub for left shoulder -maintain proper shoulder position and support when in bed and chair. Discussed with patient again today 4. Mood:Provide emotional support -antipsychotic agents: N/A 5. Neuropsych: This patientiscapable of making decisions on herown behalf. 6. Skin/Wound Care:Routine skin checks. Santylleft buttock daily covered with foam dressing. 7. Fluids/Electrolytes/Nutrition:Routine in and outs with follow-up chemistrieson admit, low albumin 2.6 monitor encourage p.o. intake, using  pro-stat 8.Atrial fibrillation. Amiodarone 200 mg twice daily, Lanoxin 0.125 mg daily, Cardizem 90 mg 4 times daily. Cardiac rate controlled. Follow cardiology services Vitals:   10/05/18 2007 10/06/18 0458  BP: 120/70 117/68  Pulse: 84 69  Resp: 18 18  Temp: 97.9 F  (36.6 C) 98.1 F (36.7 C)  SpO2: 100% 100%  HR controlled 7/22 on low side, blood pressure is normal  9. Dysphagia. Dysphagia #2, thin liquids -encourage fluids, no signs of PNA  10. Diabetes mellitus. Hemoglobin A1c 6.1. SSI. Patient on Glucophage 500 mg twice daily prior to admission. Resume as indicated 11.Rhabdomyolysis. no sign of renal failure  12. Hyperlipidemia. Lipitor 13. Constipation. Laxative assistance  14.  Leukocytosis not febrile, ua equivocal, ucx multispecies  11.4K improving LOS: 9 days A FACE TO FACE EVALUATION WAS PERFORMED  Charlett Blake 10/06/2018, 7:53 AM

## 2018-10-06 NOTE — Progress Notes (Signed)
Speech Language Pathology Daily Session Note  Patient Details  Name: Rachel Fox MRN: [id number] Date of Birth: [birth year]  Today's Date: 10/06/2018 SLP Individual Time: 1345-1430 SLP Individual Time Calculation (min): 45 min  Short Term Goals: Week 2: SLP Short Term Goal 1 (Week 2): Patient will consume Dys. 2 textures and thin liquids via cup with efficient mastication and complete oral clearance without overt s/s of aspiration with Supervision verbal cues for use of swallowing compensatory strategies. SLP Short Term Goal 2 (Week 2): Patient will demonstrate functional problem solving for basic and familiar tasks with Min A verbal cues. SLP Short Term Goal 3 (Week 2): Patient will utilize memory compensatory strategies to recall new, daily information with Min A verbal cues. SLP Short Term Goal 4 (Week 2): Patient will self-monitor and correct errors during functional tasks with Min verbal and visual cues. SLP Short Term Goal 5 (Week 2): Patient will utilize speech intelligibility strategies at the sentence level with Mod A verbal cues to achieve ~80% intelligibility.  Skilled Therapeutic Interventions:  Skilled treatment session focused on cognition goals. SLP facilitated session by providing money management activity. Pt able to count basic amounts of money and utilize money for addition/subtract and provide correct change. Pt able to recall performing money activities from previous session. Pt was mildly distracted by feeling cold. Extra blankets were already provided by nursing. Pt declined any more blankets. Pt left in bed, bed alarm on and all needs within reach. Continue per current plan of care.      Pain Pain Assessment Pain Score: 0-No pain  Therapy/Group: Individual Therapy  Jahquez Steffler 10/06/2018, 2:30 PM

## 2018-10-06 NOTE — Progress Notes (Signed)
Physical Therapy Session Note  Patient Details  Name: Rachel Fox MRN: [id number] Date of Birth: Jul 23, 1945  Today's Date: 10/06/2018 PT Individual Time: 1030-1115 PT Individual Time Calculation (min): 45 min   Short Term Goals: Week 1:  PT Short Term Goal 1 (Week 1): Pt will perform bed mobility with min assist PT Short Term Goal 2 (Week 1): Pt will perform bed<>chair transfer with mod assist PT Short Term Goal 3 (Week 1): Pt will initiate gait training  Skilled Therapeutic Interventions/Progress Updates:     Patient in w/c upon PT arrival. Patient alert and agreeable to PT session. Patient denied pain at beginning of session.   Therapeutic Activity: Bed Mobility: Patient performed sit to supine with mod A. Provided verbal cues for leaning to her L with her trunk and lifting her LEs at the same time. Transfers: Patient performed squat pivot transfers to/from a mat table and to the bed from the w/c with mod A, with therapist blocking her L knee. She performed sit to/from stand x2 from the mat table with mod A using a RW with L hand splint. Provided verbal cues for leaning forward to stand, pushing up with her R UE from the mat table, and shifting her weight to her R LE to assist her with pushing up to standing.  Wheelchair Mobility:  Patient was transported with total A in the w/c to/from the gym for time management and energy conservation.   Neuromuscular Re-ed: Patient performed standing balance using a RW with L hand splint with mod-min A  in front of a mirror 2x 2-4 min. Addressed body alignment in standing with patient correcting L posteriorly rotated pelvis, L trunk lean, and L knee flexion in standing with multi-modal cues. Also performed lateral weight shifts to each side with therapist blocking L knee to prevent buckling.   Therapeutic Exercise: Patient performed the following exercises with verbal and tactile cues for proper technique. -B scapular retractions with tactile  and visual cues for technique and muscle activation on the L -self AAROM of L shoulder flexion with B scapular retraction 2x10  Patient in bed with pillows placing her on her R side and a pillow between her knees and under her L arm at end of session with breaks locked, bed alarm set, and all needs within reach.    Therapy Documentation Precautions:  Precautions Precautions: Fall Precaution Comments: L side weak/flaccid, right gaze preference, L shoulder pain (risk for subluxation), pressure injury from laying on floor PTA Restrictions Weight Bearing Restrictions: No Pain: Pain Assessment Pain Score: 0-No painPatient reported 9/10 L posterior UE pain during session. Noted increased edema in L UE. She refused calling for medication, stating "it doesn't do anything." PT provided repositioning, massage, and distraction throughout session.     Therapy/Group: Individual Therapy  Ailin Rochford L Leah Skora PT, DPT  10/06/2018, 1:42 PM

## 2018-10-06 NOTE — Progress Notes (Signed)
Occupational Therapy Session Note  Patient Details  Name: GREY SCHLAUCH MRN: [id number] Date of Birth: Jun 02, 1945  Today's Date: 10/06/2018 OT Individual Time: 1445-1515 OT Individual Time Calculation (min): 30 min    Short Term Goals: Week 1:  OT Short Term Goal 1 (Week 1): Pt will perform toileting with max A of 1. OT Short Term Goal 1 - Progress (Week 1): Met OT Short Term Goal 2 (Week 1): Pt will perform UB dressing with mod A. OT Short Term Goal 2 - Progress (Week 1): Met OT Short Term Goal 3 (Week 1): Pt will maintain sitting balance during self care with min A overall. OT Short Term Goal 3 - Progress (Week 1): Met Week 2:  OT Short Term Goal 1 (Week 2): Pt will be able to complete a squat pivot transfer to the toilet with min A (vs using the stedy) OT Short Term Goal 2 (Week 2): Pt will be able to don shirt with min A. OT Short Term Goal 3 (Week 2): Pt will be able to bathe with min A. OT Short Term Goal 4 (Week 2): Pt will be able to sit to stand during LB self care with min A. OT Short Term Goal 5 (Week 2): Pt will be able to maintain stand balance with mod A while pulling pants up and down with 25% or less A.  Skilled Therapeutic Interventions/Progress Updates:   1:1 Pt in bed when arrived. Pt transitioned to EOB with mod A with cues for sequencing. At EOB engaged in task with design on pegboard like provided picture with focus on visual attention to left with min cuing for mistakes.   Performed reaching with focus left weight bearing, body on arm ROM to decr tone and opportunities for activation. PT able to activate left UE with shoulder flexion, elbow extension/ flexion with slight resistance for feedback in sitting as well in supine. Pt able to relax tone by the end of session.   Left resting in bed.      Therapy Documentation Precautions:  Precautions Precautions: Fall Precaution Comments: L side weak/flaccid, right gaze preference, L shoulder pain (risk for  subluxation), pressure injury from laying on floor PTA Restrictions Weight Bearing Restrictions: No General:   Vital Signs: Therapy Vitals Temp: 98 F (36.7 C) Temp Source: Oral Pulse Rate: 82 Resp: 16 BP: 122/61 Patient Position (if appropriate): Sitting Oxygen Therapy SpO2: 99 % O2 Device: Room Air Pain:  soreness in left UE; allowed for rest and stretching as tolerated.  Therapy/Group: Individual Therapy  Willeen Cass [facility] 10/06/2018, 6:12 PM

## 2018-10-07 ENCOUNTER — Inpatient Hospital Stay (HOSPITAL_COMMUNITY): Payer: Medicare Other | Admitting: Speech Pathology

## 2018-10-07 ENCOUNTER — Inpatient Hospital Stay (HOSPITAL_COMMUNITY): Payer: Medicare Other | Admitting: Occupational Therapy

## 2018-10-07 ENCOUNTER — Inpatient Hospital Stay (HOSPITAL_COMMUNITY): Payer: Medicare Other | Admitting: Physical Therapy

## 2018-10-07 LAB — GLUCOSE, CAPILLARY
Glucose-Capillary: 103 mg/dL — ABNORMAL HIGH (ref 70–99)
Glucose-Capillary: 104 mg/dL — ABNORMAL HIGH (ref 70–99)
Glucose-Capillary: 92 mg/dL (ref 70–99)
Glucose-Capillary: 104 mg/dL — ABNORMAL HIGH (ref 70–99)

## 2018-10-07 NOTE — Progress Notes (Signed)
Speech Language Pathology Daily Session Note  Patient Details  Name: Rachel Fox MRN: [id number] Date of Birth: 29-Aug-1945  Today's Date: 10/07/2018 SLP Individual Time: 1100-1200 SLP Individual Time Calculation (min): 60 min  Short Term Goals: Week 2: SLP Short Term Goal 1 (Week 2): Patient will consume Dys. 2 textures and thin liquids via cup with efficient mastication and complete oral clearance without overt s/s of aspiration with Supervision verbal cues for use of swallowing compensatory strategies. SLP Short Term Goal 2 (Week 2): Patient will demonstrate functional problem solving for basic and familiar tasks with Min A verbal cues. SLP Short Term Goal 3 (Week 2): Patient will utilize memory compensatory strategies to recall new, daily information with Min A verbal cues. SLP Short Term Goal 4 (Week 2): Patient will self-monitor and correct errors during functional tasks with Min verbal and visual cues. SLP Short Term Goal 5 (Week 2): Patient will utilize speech intelligibility strategies at the sentence level with Mod A verbal cues to achieve ~80% intelligibility.  Skilled Therapeutic Interventions:  Skilled treatment session focused on dysphagia and communication goals. SLP facilitated session by providing skilled observation of pt consuming dysphagia 3 snack. Pt with increased left anterior spillage with advanced trials as well as continued throat clears. Question if throat clears are related to increased piecemeal swallowing d/t decreased bolus cohesion (?). SLP further facilitated session by providing description task. With Mod A cues, pt achieved ~ 50% speech intelligibility at the simple phrase level in mildly noisy environment. Pt with no self-awareness of decreased speech intelligibility/communication breakdown. Pt left upright in bed, bed alarm on and all needs within reach.      Pain Pain Assessment Pain Scale: Faces Pain Score: 0-No pain  Therapy/Group: Individual  Therapy  [name] 10/07/2018, 12:38 PM

## 2018-10-07 NOTE — Progress Notes (Signed)
[place] PHYSICAL MEDICINE & REHABILITATION PROGRESS NOTE   Subjective/Complaints:  No pain complaints today.  States that the headache medication is doing well for her.  Review of systems difficult to obtained due to cognition poor awareness of deficits.   Objective:   No results found. No results for input(s): WBC, HGB, HCT, PLT in the last 72 hours. No results for input(s): NA, K, CL, CO2, GLUCOSE, BUN, CREATININE, CALCIUM in the last 72 hours.  Intake/Output Summary (Last 24 hours) at 10/07/2018 0820 Last data filed at 10/06/2018 1747 Gross per 24 hour  Intake 360 ml  Output -  Net 360 ml     Physical Exam: Vital Signs Blood pressure (!) 111/57, pulse 70, temperature 97.8 F (36.6 C), temperature source Oral, resp. rate 18, height 5\' 3"  (1.6 m), weight 72 kg, SpO2 100 %.   General: No acute distress Mood and affect are appropriate Heart: Regular rate and rhythm no rubs murmurs or extra sounds Lungs: Clear to auscultation, breathing unlabored, no rales or wheezes Abdomen: Positive bowel sounds, soft nontender to palpation, nondistended Extremities: No clubbing, cyanosis, or edema Skin: Dressing on left buttocks decubitus   Left neglect noted  Neurologic: Cranial nerves II through XII intact, motor strength is 5/5 in right deltoid, bicep, tricep, grip, hip flexor, knee extensors, ankle dorsiflexor and plantar flexor 0/5 left upper extremity deltoid bicep tricep grip Trace hip knee extensor synergy in the left lower extremity and aduction  Sensation absent light touch in the left upper extremity Intact light touch left lower extremity  Cerebellar exam unable to perform on left side due to weakness musculoskeletal: Full range of motion in all 4 extremities. No joint swelling  Assessment/Plan: 1. Functional deficits secondary to right MCA infarct which require 3+ hours per day of interdisciplinary therapy in a comprehensive inpatient rehab setting.  Physiatrist is  providing close team supervision and 24 hour management of active medical problems listed below.  Physiatrist and rehab team continue to assess barriers to discharge/monitor patient progress toward functional and medical goals  Care Tool:  Bathing    Body parts bathed by patient: Left arm, Abdomen, Chest, Front perineal area, Left upper leg, Right upper leg, Face   Body parts bathed by helper: Right arm, Buttocks, Right lower leg, Left lower leg     Bathing assist Assist Level: Moderate Assistance - Patient 50 - 74%     Upper Body Dressing/Undressing Upper body dressing   What is the patient wearing?: Pull over shirt    Upper body assist Assist Level: Moderate Assistance - Patient 50 - 74%    Lower Body Dressing/Undressing Lower body dressing      What is the patient wearing?: Pants, Incontinence brief     Lower body assist Assist for lower body dressing: Maximal Assistance - Patient 25 - 49%     Toileting Toileting    Toileting assist Assist for toileting: Moderate Assistance - Patient 50 - 74%     Transfers Chair/bed transfer  Transfers assist  Chair/bed transfer activity did not occur: Safety/medical concerns  Chair/bed transfer assist level: Moderate Assistance - Patient 50 - 74%     Locomotion Ambulation   Ambulation assist   Ambulation activity did not occur: Safety/medical concerns  Assist level: 2 helpers Assistive device: (rail in hallway) Max distance: 30 ft   Walk 10 feet activity   Assist  Walk 10 feet activity did not occur: Safety/medical concerns  Assist level: 2 helpers Assistive device: (rail)   Walk  50 feet activity   Assist Walk 50 feet with 2 turns activity did not occur: Safety/medical concerns         Walk 150 feet activity   Assist Walk 150 feet activity did not occur: Safety/medical concerns         Walk 10 feet on uneven surface  activity   Assist Walk 10 feet on uneven surfaces activity did not occur:  Safety/medical concerns         Wheelchair     Assist Will patient use wheelchair at discharge?: Yes Type of Wheelchair: Manual    Wheelchair assist level: Minimal Assistance - Patient > 75% Max wheelchair distance: 50 ft    Wheelchair 50 feet with 2 turns activity    Assist        Assist Level: Minimal Assistance - Patient > 75%   Wheelchair 150 feet activity     Assist Wheelchair 150 feet activity did not occur: Safety/medical concerns        Medical Problem List and Plan: 1.Lefthemiparesis, dysphagia, and visual-spatial deficitssecondary to right MCA infarction --Continue CIR therapies including PT, OT, and SLP   -PRAFO LLE 2. Antithrombotics: -DVT/anticoagulation:Eliquison board -antiplatelet therapy: N/A 3. Pain Management:Tylenol as needed- added topamax for HA with some benefit increased to BID- improving    -continue muscle rub for left shoulder -maintain proper shoulder position and support when in bed and chair. Discussed with patient again today 4. Mood:Provide emotional support -antipsychotic agents: N/A 5. Neuropsych: This patientiscapable of making decisions on herown behalf. 6. Skin/Wound Care:Routine skin checks. Santylleft buttock daily covered with foam dressing. 7. Fluids/Electrolytes/Nutrition:Routine in and outs with follow-up chemistrieson admit, low albumin 2.6 monitor encourage p.o. intake, using  pro-stat 8.Atrial fibrillation. Amiodarone 200 mg twice daily, Lanoxin 0.125 mg daily, Cardizem 90 mg 4 times daily. Cardiac rate controlled. Follow cardiology services Vitals:   10/06/18 1953 10/07/18 0518  BP: (!) 127/57 (!) 111/57  Pulse: (!) 54 70  Resp: 18 18  Temp: 98.2 F (36.8 C) 97.8 F (36.6 C)  SpO2: 100% 100%  HR controlled 7/23 on low side, blood pressure is normal  9. Dysphagia. Dysphagia #2, thin liquids  -encourage fluids, no signs of PNA  10. Diabetes mellitus. Hemoglobin A1c 6.1. SSI. Patient on Glucophage 500 mg twice daily prior to admission. Resume as indicated 11.Rhabdomyolysis. no sign of renal failure  12. Hyperlipidemia. Lipitor 13. Constipation. Laxative assistance  14.  Leukocytosis not febrile, ua equivocal, ucx multispecies  11.4K improving LOS: 10 days A FACE TO FACE EVALUATION WAS PERFORMED  Charlett Blake 10/07/2018, 8:20 AM

## 2018-10-07 NOTE — Progress Notes (Signed)
Physical Therapy Weekly Progress Note  Patient Details  Name: Rachel Fox MRN: [id number] Date of Birth: Oct 06, 1945  Beginning of progress report period: September 28, 2018 End of progress report period: October 07, 2018  Today's Date: 10/07/2018 PT Individual Time: 1630-1730 PT Individual Time Calculation (min): 60 min   Patient has met 2  of 3 short term goals.  Pt making stedy progress towards LTG. Improved bed mobility to min-supervision assist with max cues for trunk control. Sitting balance and standing balance have improved to supervision assist EOB and min-mod assist in standing. Gait training was initiated and has progressed to mod assist X 53f with RW, and significantly decreased pushers syndrome.   Patient continues to demonstrate the following deficits muscle weakness, muscle joint tightness and muscle paralysis, decreased cardiorespiratoy endurance, unbalanced muscle activation, motor apraxia, decreased coordination and decreased motor planning, decreased midline orientation, decreased attention to left, decreased motor planning and ideational apraxia, decreased initiation, decreased attention, decreased awareness, decreased problem solving, decreased safety awareness, decreased memory and delayed processing and decreased sitting balance, decreased standing balance, decreased postural control, hemiplegia and decreased balance strategies and therefore will continue to benefit from skilled PT intervention to increase functional independence with mobility.  Patient progressing toward long term goals..  Continue plan of care.  PT Short Term Goals Week 1:  PT Short Term Goal 1 (Week 1): Pt will perform bed mobility with min assist PT Short Term Goal 1 - Progress (Week 1): Progressing toward goal PT Short Term Goal 2 (Week 1): Pt will perform bed<>chair transfer with mod assist PT Short Term Goal 2 - Progress (Week 1): Met PT Short Term Goal 3 (Week 1): Pt will initiate gait training PT  Short Term Goal 3 - Progress (Week 1): Met Week 2:  PT Short Term Goal 1 (Week 2): Pt will perform bed mobility with min assist consistently PT Short Term Goal 2 (Week 2): Pt will ambulate 524fwith mod assist and LRAD PT Short Term Goal 3 (Week 2): Pt will maintain standing balance x 2 minutes with min A from PT  Skilled Therapeutic Interventions/Progress Updates:   Pt received sitting in WC and agreeable to PT. Pt reports need for urination. stedy transfer to toilet with CGA. Pt able to void bladder. [facility] with min assist. Gait training x 4024fith mod assist and max multimodal cues for weight shift R to advance the LLE and for AD management. Pt returned to room and performed ambulatory transfer to bed with mod-max assist for safety in turn with RW . Sit>supine completed with mod assist for trunk and LLE control. Pt left supine in bed with, pillows places for pressure relief, call bell in reach and all needs met.        Therapy Documentation Precautions:  Precautions Precautions: Fall Precaution Comments: L side weak/flaccid, right gaze preference, L shoulder pain (risk for subluxation), pressure injury from laying on floor PTA Restrictions Weight Bearing Restrictions: No    Vital Signs: Therapy Vitals Temp: (!) 97.3 F (36.3 C) Pulse Rate: 76 Resp: (!) 22 BP: 118/68 Patient Position (if appropriate): Sitting Oxygen Therapy SpO2: 100 % O2 Device: Room Air Pain: denies Therapy/Group: Individual Therapy  AusLorie Phenix23/2020, 5:43 PM

## 2018-10-07 NOTE — Consult Note (Addendum)
[facility] Nurse wound consult note Reason for Consult: Consult requested for left buttock wound.  [facility] team is performing consults remotely today.  Reviewed progress notes and photo in the EMR.  [facility] team previously performed a consult on 7/10.  Wound has declined since that time. Wound type: Unstageable pressure injury to left buttock Pressure Injury POA: Yes Wound bed: 100% slough/eschar with large amt tan drainage and odor and tunneling noted by the bedside nurse. Nursing flowsheet indicates measurements are 8.6X9X2.8cm Dressing procedure/placement/frequency: Pt was previously receiving Santyl ointment and is on a low airloss mattress to decrease pressure.  Orders provided for Physical therapy to perform hydrotherapy to left buttock wound Q Mon-Sat, then apply Santyl and moist gauze, then ABD pads and tape. Bedside nurse to perform dressing change Q Sun. Pt could benefit from a surgical consult for sharp debridement, since there is an extensive amount of nonviable tissue. Primary team: please order if you agree. [facility] team will reassess location weekly to determine if a change is topical treatment is indicated. Julien Girt MSN, RN, [place], [place], [place]

## 2018-10-07 NOTE — Progress Notes (Signed)
Occupational Therapy Session Note  Patient Details  Name: Rachel Fox MRN: [id number] Date of Birth: [birth year]  Today's Date: 10/07/2018 OT Individual Time: 3329-5188 OT Individual Time Calculation (min): 45 min    Short Term Goals: Week 2:  OT Short Term Goal 1 (Week 2): Pt will be able to complete a squat pivot transfer to the toilet with min A (vs using the stedy) OT Short Term Goal 2 (Week 2): Pt will be able to don shirt with min A. OT Short Term Goal 3 (Week 2): Pt will be able to bathe with min A. OT Short Term Goal 4 (Week 2): Pt will be able to sit to stand during LB self care with min A. OT Short Term Goal 5 (Week 2): Pt will be able to maintain stand balance with mod A while pulling pants up and down with 25% or less A.  Skilled Therapeutic Interventions/Progress Updates:    Pt completed supine to sit EOB with min assist to start session.  She then completed squat pivot transfer to the right with mod assist.  She requested the need to toilet, so therapist transported her to the bathroom where she completed stand pivot transfer to the right using the grab bar for support to the toilet.  Max assist for clothing management and toilet hygiene.  Mod assist for stand pivot transfer back to the chair as well.  She then worked on dressing from the wheelchair.  Mod instructional cueing for hemi dressing techniques.  Pt with inability to tolerate crossing the LLE over the right knee for donning clothing so therapist had to assist with bringing it up part of the way so she could thread her pants.  She then stood with mod assist and pulled them over her hips using the RUE only.  Mod assist with mod instructional cueing for orientation of pullover shirt and for donning following hemi dressing technique.  Finished session with pt in the wheelchair with call button and phone in reach and safety belt in place.  Pt left sitting up in the regular wheelchair and instructed to call nursing if she was  having increased pain.    Therapy Documentation Precautions:  Precautions Precautions: Fall Precaution Comments: L side weak/flaccid, right gaze preference, L shoulder pain (risk for subluxation), pressure injury from laying on floor PTA Restrictions Weight Bearing Restrictions: No  Pain: Pain Assessment Pain Scale: Faces Pain Score: 0-No pain ADL: See Care Tool Section for some details of toileting and dressing  Therapy/Group: Individual Therapy  Arleigh Dicola OTR/L 10/07/2018, 12:29 PM

## 2018-10-07 NOTE — Progress Notes (Signed)
Occupational Therapy Session Note  Patient Details  Name: Rachel Fox MRN: [id number] Date of Birth: [birth year]  Today's Date: 10/07/2018 OT Individual Time: 1250-1405 OT Individual Time Calculation (min): 75 min   Short Term Goals: Week 2:  OT Short Term Goal 1 (Week 2): Pt will be able to complete a squat pivot transfer to the toilet with min A (vs using the stedy) OT Short Term Goal 2 (Week 2): Pt will be able to don shirt with min A. OT Short Term Goal 3 (Week 2): Pt will be able to bathe with min A. OT Short Term Goal 4 (Week 2): Pt will be able to sit to stand during LB self care with min A. OT Short Term Goal 5 (Week 2): Pt will be able to maintain stand balance with mod A while pulling pants up and down with 25% or less A.  Skilled Therapeutic Interventions/Progress Updates:    Pt greeted sitting in regular wc and agreeable to OT treatment session. Pt reports need to go to the bathroom. Pt brought into bathroom via wc and complete stand-pivot to raised commode with grab bars and mod A to the stronger R side. Max A for clothing management. Pt unable to void. Mod A to stand from commode and max A to pull up pants. Pt then transferred back to wc on weaker L side with max A to facilitate pivot. Pt brought to the sink in wc and worked on B UE hand washing task.Verbal cues to use R hand to place L hand in the sink for washing. Nurse tech entered room with lunch, so worked on L attention and visual scanning to locate items on L side of tray.  Pt requested to wash UB at the sink after lunch. OT provided pt with wash cloth with pt able to wash chest, abdomen and min verbal cues to wash L arm. OT provided hand over hand A to integrate L UE into bathing R arm. Reviewed hemi-dressing techniques with pt able to inititiate placing L UE first (using R hand), but still needed OT assistance to pull sleeve up completely. Pt brought to therapy day room and worked on sit<>stands with mod A of 1 person and  completed standing weight bearing towel pushes with L UE for neuro re-ed. Pt with shoulder and scap activation noted. PT needed OT facilitation to achieve shoulder/elbow extension 2/2 tone. Pt returned to room and left seated in wc with alarm belt on, call bell in reach, and needs met.   Therapy Documentation Precautions:  Precautions Precautions: Fall Precaution Comments: L side weak/flaccid, right gaze preference, L shoulder pain (risk for subluxation), pressure injury from laying on floor PTA Restrictions Weight Bearing Restrictions: No Pain: Pain Assessment Pain Scale: Faces Pain Score: 0-No pain   Therapy/Group: Individual Therapy  Valma Cava 10/07/2018, 12:59 PM

## 2018-10-08 ENCOUNTER — Inpatient Hospital Stay (HOSPITAL_COMMUNITY): Payer: Medicare Other | Admitting: Occupational Therapy

## 2018-10-08 ENCOUNTER — Inpatient Hospital Stay (HOSPITAL_COMMUNITY): Payer: Medicare Other | Admitting: Speech Pathology

## 2018-10-08 ENCOUNTER — Inpatient Hospital Stay (HOSPITAL_COMMUNITY): Payer: Medicare Other | Admitting: Physical Therapy

## 2018-10-08 LAB — BASIC METABOLIC PANEL
Anion gap: 9 (ref 5–15)
BUN: 21 mg/dL (ref 8–23)
CO2: 23 mmol/L (ref 22–32)
Calcium: 10.4 mg/dL — ABNORMAL HIGH (ref 8.9–10.3)
Chloride: 112 mmol/L — ABNORMAL HIGH (ref 98–111)
Creatinine, Ser: 1.11 mg/dL — ABNORMAL HIGH (ref 0.44–1.00)
GFR calc Af Amer: 58 mL/min — ABNORMAL LOW (ref >60)
GFR calc non Af Amer: 50 mL/min — ABNORMAL LOW (ref >60)
Glucose, Bld: 105 mg/dL — ABNORMAL HIGH (ref 70–99)
Potassium: 3.6 mmol/L (ref 3.5–5.1)
Sodium: 144 mmol/L (ref 135–145)

## 2018-10-08 LAB — CBC
HCT: 45.2 % (ref 36.0–46.0)
Hemoglobin: 14.5 g/dL (ref 12.0–15.0)
MCH: 27.7 pg (ref 26.0–34.0)
MCHC: 32.1 g/dL (ref 30.0–36.0)
MCV: 86.3 fL (ref 80.0–100.0)
Platelets: 383 10*3/uL (ref 150–400)
RBC: 5.24 MIL/uL — ABNORMAL HIGH (ref 3.87–5.11)
RDW: 14.2 % (ref 11.5–15.5)
WBC: 10.1 10*3/uL (ref 4.0–10.5)
nRBC: 0 % (ref 0.0–0.2)

## 2018-10-08 LAB — GLUCOSE, CAPILLARY
Glucose-Capillary: 125 mg/dL — ABNORMAL HIGH (ref 70–99)
Glucose-Capillary: 91 mg/dL (ref 70–99)
Glucose-Capillary: 113 mg/dL — ABNORMAL HIGH (ref 70–99)
Glucose-Capillary: 118 mg/dL — ABNORMAL HIGH (ref 70–99)

## 2018-10-08 NOTE — Consult Note (Addendum)
[facility] team follow-up: [facility] team performed consult for buttocks wound remotely yesterday, utilizing the photo that was available in the EMR.  A current photo was posted this am, and the wound bed is clean without further slough; it has evolved from unstageable to stage 4.  There is a large amt drainage reported.  Surgical team consult was performed and indicates that the wound is clean, and there is no need for further debridement.  Santyl and hydrotherapy discontinued, and orders provided for staff nurses to perform dressing changes daily with Aquacel to absorb drainage and provide antimicrobial benefits. Please re-consult if further assistance is needed.  Thank-you,  Julien Girt MSN, [place], [place], [place], [place]

## 2018-10-08 NOTE — Progress Notes (Addendum)
Speech Language Pathology Daily Session Note  Patient Details  Name: Rachel Fox MRN: [id number] Date of Birth: [birth year]  Today's Date: 10/08/2018 SLP Individual Time: 1941-7408; 1100-1108 SLP Individual Time Calculation (min): 60 min, 8 minutes  Short Term Goals: Week 2: SLP Short Term Goal 1 (Week 2): Patient will consume Dys. 2 textures and thin liquids via cup with efficient mastication and complete oral clearance without overt s/s of aspiration with Supervision verbal cues for use of swallowing compensatory strategies. SLP Short Term Goal 2 (Week 2): Patient will demonstrate functional problem solving for basic and familiar tasks with Min A verbal cues. SLP Short Term Goal 3 (Week 2): Patient will utilize memory compensatory strategies to recall new, daily information with Min A verbal cues. SLP Short Term Goal 4 (Week 2): Patient will self-monitor and correct errors during functional tasks with Min verbal and visual cues. SLP Short Term Goal 5 (Week 2): Patient will utilize speech intelligibility strategies at the sentence level with Mod A verbal cues to achieve ~80% intelligibility.  Skilled Therapeutic Interventions:  Skilled treatment focused on cognition and dysphagia goals. SLP received pt in bed. Pt able to arouse to SLP's voice and she requested to use bathroom prior to consuming breakfast. SLP and NT facilitated transfer to bathroom. Pt unable to void. With Max encouragement, pt agreeable to remaining up in wheelchair for consumption of breakfast. While consuming dysphagia 2 breakfast items, pt required Min A cues to decreased left anterior spillage. Pt able to implement strategies for ~ 75%. Additionally, most breakfast items were placed to pt's left to increase attention to left field of environment. Pt able to locate all items with Mod I. Despite complaining of pain to buttocks, pt able to demonstrate selective attention to task for ~ 30 minutes. SLP facilitated transfer back  to air mattress at completion of  meal. Pt left in bed, bed alarm on and all needs within reach. Continue per current plan of care.   Pt with unresponsive episode during PT. After episode, pt requested water. SLP was with pt during request and provided assessment of pt consuming thin liquids. Pt with no overt s/s of aspiration and would recommend continuing current diet as pt appears to be back at baseline.         Pain    Therapy/Group: Individual Therapy  Myan Locatelli 10/08/2018, 9:02 AM

## 2018-10-08 NOTE — Progress Notes (Signed)
Patient was with PT and had an episode of ortho-static hypotension, and hypoperfusion, with slurred speech, and more pronounced left facial droop. A code stroke was called as PT, ST, and this RN put patient back to bed and began getting vital signs. Patient was questioned by ST to check patients orientation. Patient's blood pressure began to stabilize at 105/67, with a HR of 90, and 100% O2 RA. Patient is A&O x4. Dan Angiull, PA-C ordered STAT BMET, and  CBC, thigh high TED hose, and PRN O2. Patient is resting in bed comfortably with call bell at side.

## 2018-10-08 NOTE — Progress Notes (Signed)
Physical Therapy Session Note  Patient Details  Name: Rachel Fox MRN: [id number] Date of Birth: [birth year]  Today's Date: 10/08/2018 PT Individual Time: 1000-1055 PT Individual Time Calculation (min): 55 min   Short Term Goals: Week 2:  PT Short Term Goal 1 (Week 2): Pt will perform bed mobility with min assist consistently PT Short Term Goal 2 (Week 2): Pt will ambulate 485f with mod assist and LRAD PT Short Term Goal 3 (Week 2): Pt will maintain standing balance x 2 minutes with min A from PT  Skilled Therapeutic Interventions/Progress Updates:  Pt received supine in bed and agreeable to PT. Supine>sit transfer with mod assist at trunk and moderate cues for activation of the LLE. Sit<>stand with mod assist at EOB for Pt to pull pants to waist. [facility] and moderate cues for improved terminal knee extension on the LLE. [facility] with RW x 352fwith RW and mod assist for improved weight shifting R and increased step height on the L. Rest break seated in arm chair. No s/s or complaints of dizziness with transfers of 1st bout of gait trainig.   Sit>stand from chair with mod assist and cues for anterior weight shift. Pt performed ambulated with PT x [id number] RW and mod assist, and then has noted decrease in motor planning. When questioned, pt state "My head hurts." returned to chair with max assist for safety. Decreased trunk control noted. PT assessed Vital signs while engaging pt with questions. BP: 62/29, HR 65. Re assessed: HR 48. BP 69/41. Noted increase in L side facial droop and increasing dysarthria when responding to questions. Total assist transfter to WC. And transported to pt room. RN mad aware and Code Stroke called. +2 total A transfer to bed and pt place in slight trendelenburg position in bed.  BP assessed in supine: 105/67, HR 90 with mild improvement in speech intelligibility. Rapid response then present as  pt returned to baseline in presentation. Pt left in bed with RN present.   Session 2.  Pt received supine in bed. Pt requesting to rest for the remainder of the day in bed. PT educated pt in benefits of continued therapy and then left pt in bed with call bell in reach and all needs met.        Therapy Documentation Precautions:  Precautions Precautions: Fall Precaution Comments: L side weak/flaccid, right gaze preference, L shoulder pain (risk for subluxation), pressure injury from laying on floor PTA Restrictions Weight Bearing Restrictions: No  Pain: Pain Assessment Pain Scale: 0-10 Pain Score: 5  Pain Type: Acute pain Pain Location: Shoulder Pain Orientation: Left Pain Intervention(s): Ambulation/increased activity    Therapy/Group: Individual Therapy  AusLorie Phenix24/2020, 11:08 AM

## 2018-10-08 NOTE — Progress Notes (Signed)
Patient ID: Rachel Fox, female   DOB: 08-14-1945, 73 y.o.   MRN: [id number] Asked to see buttock wound for possible debridement of an unstageable wound.  Wound is completely clean and does not need debridement.  See picture below.  Discussed with Rachel Fox.  We will sign off.  Continue currently dressing changes.    Henreitta Cea 10:15 AM 10/08/2018

## 2018-10-08 NOTE — Progress Notes (Addendum)
[facility] PHYSICAL MEDICINE & REHABILITATION PROGRESS NOTE   Subjective/Complaints:  No issues overnite  Review of systems difficult to obtained due to cognition poor awareness of deficits.   Objective:   No results found. No results for input(s): WBC, HGB, HCT, PLT in the last 72 hours. No results for input(s): NA, K, CL, CO2, GLUCOSE, BUN, CREATININE, CALCIUM in the last 72 hours. No intake or output data in the 24 hours ending 10/08/18 0828   Physical Exam: Vital Signs Blood pressure 135/89, pulse 76, temperature 97.8 F (36.6 C), resp. rate 18, height 5\' 3"  (1.6 m), weight 72 kg, SpO2 100 %.   General: No acute distress Mood and affect are appropriate Heart: Regular rate and rhythm no rubs murmurs or extra sounds Lungs: Clear to auscultation, breathing unlabored, no rales or wheezes Abdomen: Positive bowel sounds, soft nontender to palpation, nondistended Extremities: No clubbing, cyanosis, or edema Skin: Dressing on left buttocks decubitus   Left neglect noted  Neurologic: Cranial nerves II through XII intact, motor strength is 5/5 in right deltoid, bicep, tricep, grip, hip flexor, knee extensors, ankle dorsiflexor and plantar flexor 0/5 left upper extremity deltoid bicep tricep grip Trace hip knee extensor synergy in the left lower extremity and aduction  Sensation absent light touch in the left upper extremity Intact light touch left lower extremity  Cerebellar exam unable to perform on left side due to weakness musculoskeletal: Full range of motion in all 4 extremities. No joint swelling    Assessment/Plan: 1. Functional deficits secondary to right MCA infarct which require 3+ hours per day of interdisciplinary therapy in a comprehensive inpatient rehab setting.  Physiatrist is providing close team supervision and 24 hour management of active medical problems listed below.  Physiatrist and rehab team continue to assess barriers to discharge/monitor patient  progress toward functional and medical goals  Care Tool:  Bathing    Body parts bathed by patient: Left arm, Abdomen, Chest, Front perineal area, Left upper leg, Right upper leg, Face   Body parts bathed by helper: Right arm, Buttocks, Right lower leg, Left lower leg     Bathing assist Assist Level: Moderate Assistance - Patient 50 - 74%     Upper Body Dressing/Undressing Upper body dressing   What is the patient wearing?: Pull over shirt    Upper body assist Assist Level: Moderate Assistance - Patient 50 - 74%    Lower Body Dressing/Undressing Lower body dressing      What is the patient wearing?: Pants, Incontinence brief     Lower body assist Assist for lower body dressing: Maximal Assistance - Patient 25 - 49%     Toileting Toileting    Toileting assist Assist for toileting: Maximal Assistance - Patient 25 - 49%     Transfers Chair/bed transfer  Transfers assist  Chair/bed transfer activity did not occur: Safety/medical concerns  Chair/bed transfer assist level: Moderate Assistance - Patient 50 - 74%     Locomotion Ambulation   Ambulation assist   Ambulation activity did not occur: Safety/medical concerns  Assist level: Moderate Assistance - Patient 50 - 74% Assistive device: Walker-rolling Max distance: 40   Walk 10 feet activity   Assist  Walk 10 feet activity did not occur: Safety/medical concerns  Assist level: 2 helpers Assistive device: Walker-rolling   Walk 50 feet activity   Assist Walk 50 feet with 2 turns activity did not occur: Safety/medical concerns         Walk 150 feet activity  Assist Walk 150 feet activity did not occur: Safety/medical concerns         Walk 10 feet on uneven surface  activity   Assist Walk 10 feet on uneven surfaces activity did not occur: Safety/medical concerns         Wheelchair     Assist Will patient use wheelchair at discharge?: Yes Type of Wheelchair: Manual     Wheelchair assist level: Minimal Assistance - Patient > 75% Max wheelchair distance: 50 ft    Wheelchair 50 feet with 2 turns activity    Assist        Assist Level: Minimal Assistance - Patient > 75%   Wheelchair 150 feet activity     Assist Wheelchair 150 feet activity did not occur: Safety/medical concerns        Medical Problem List and Plan: 1.Lefthemiparesis, dysphagia, and visual-spatial deficitssecondary to right MCA infarction --Continue CIR therapies including PT, OT, and SLP   -PRAFO LLE 2. Antithrombotics: -DVT/anticoagulation:Eliquison board -antiplatelet therapy: N/A 3. Pain Management:Tylenol as needed- added topamax for HA with some benefit increased to BID- improving    -continue muscle rub for left shoulder -maintain proper shoulder position and support when in bed and chair. Discussed with patient again today 4. Mood:Provide emotional support -antipsychotic agents: N/A 5. Neuropsych: This patientiscapable of making decisions on herown behalf. 6. Skin/Wound Care:Routine skin checks. Santylleft buttock daily covered with foam dressing.Wound deeper, ask Gen surg to eval 7. Fluids/Electrolytes/Nutrition:Routine in and outs with follow-up chemistrieson admit, low albumin 2.6 monitor encourage p.o. intake, using  pro-stat 8.Atrial fibrillation. Amiodarone 200 mg twice daily, Lanoxin 0.125 mg daily, Cardizem 90 mg 4 times daily. Cardiac rate controlled. Follow cardiology services Vitals:   10/07/18 1625 10/07/18 2011  BP: 118/68 135/89  Pulse:  76  Resp:  18  Temp:  97.8 F (36.6 C)  SpO2:    HR controlled 7/24, blood pressure is normal  9. Dysphagia. Dysphagia #2, thin liquids -encourage fluids, no signs of PNA  10. Diabetes mellitus. Hemoglobin A1c 6.1. SSI. Patient on Glucophage 500 mg twice daily prior to admission. Resume as indicated  11.Rhabdomyolysis. no sign of renal failure  12. Hyperlipidemia. Lipitor 13. Constipation. Laxative assistance  14.  Leukocytosis not febrile, ua equivocal, ucx multispecies  11.4K improving LOS: 11 days A FACE TO FACE EVALUATION WAS PERFORMED  Erick Colacendrew E Kirsteins 10/08/2018, 8:28 AM

## 2018-10-08 NOTE — Significant Event (Signed)
Rapid Response Event Note  Time Called: 8786 Arrival Time: 7672 Event Type: Neurologic, Hypotension  Overview: Code Stroke called 0947.  LKW approximately 0900.  Code stroke called because patient became unresponsive.  Patient was working with PT and SLP and because orthostatic hypotensive previous to becoming unresponsive. Pt's BP before call was 69/41 while sitting.  Her pulse was 48.  Pt was helped safely back to bed by staff.   Initial Focused Assessment: Pt has had previous stroke causing baseline right gaze preference and left sided hemiparesis.  Upon arrival patient was back in bed and vitals signs were being taken.  SLP and pt's RN were at bedside.  Pt's BP when we arrived was 123/70 while lying in bed. Dr. Lorraine Lax and Linna Hoff PA were at the scene.  Code stroke canceled upon arrival. Patient back at neuro baseline.    Interventions: No RRN interventions at time but Dr. Vonzella Nipple a panel of blood chemistry to further investigate.    Plan of Care (if not transferred): Patient is going to be encouraged to increase fluid intake.  Event Summary: Name of Physician Notified: Marlowe Shores at [facility]roke paged at  1042    Arrived at patient's room at  1045  Outcome: Stayed in room and stabalized Code Stroke canceled by neurology  Event End Time: [place]

## 2018-10-08 NOTE — Progress Notes (Signed)
Occupational Therapy Session Note  Patient Details  Name: Rachel Fox MRN: [id number] Date of Birth: 02-27-1946  Today's Date: 10/08/2018 OT Individual Time: 1420-1530 OT Individual Time Calculation (min): 70 min   Short Term Goals: Week 2:  OT Short Term Goal 1 (Week 2): Pt will be able to complete a squat pivot transfer to the toilet with min A (vs using the stedy) OT Short Term Goal 2 (Week 2): Pt will be able to don shirt with min A. OT Short Term Goal 3 (Week 2): Pt will be able to bathe with min A. OT Short Term Goal 4 (Week 2): Pt will be able to sit to stand during LB self care with min A. OT Short Term Goal 5 (Week 2): Pt will be able to maintain stand balance with mod A while pulling pants up and down with 25% or less A.  Skilled Therapeutic Interventions/Progress Updates:    Per RN, bedlevel therapy ok for session. Pt was greeted with NT present, in the middle of lunch. OT resumed providing supervision assist while she ate her meal. Placed the lunch tray on pts Lt side to promote scanning and Lt head turns. Verbal and visual cues for tongue sweep due to Lt pocketing. When she was finished eating, worked on tone reduction for L UE via PROM exercises. Increased hypertonicity noted today. Prolonged stretches completed in shoulder external rotation, elbow extension, and forearm supination due to tightness. Had pt visually attend to affected UE and take 10 deep breaths during each stretch. Unable to tolerate >40 degrees of shoulder abduction due to pain. Gentle massage completed to lateral deltoid to address. Gentle joint compressions completed to wrist and digits for increasing proprioceptive input. Assisted pt with face washing, hand washing, and lotion application. At this time therapeutic focus was placed on Lt attention and incorporation of L UE with HOH techniques. Pt rolled towards Rt with Mod A for OT to position her in partial sidelying for pressure relief. She was left with all  needs within reach and 4 bedrails up.    Therapy Documentation Precautions:  Precautions Precautions: Fall Precaution Comments: L side weak/flaccid, right gaze preference, L shoulder pain (risk for subluxation), pressure injury from laying on floor PTA Restrictions Weight Bearing Restrictions: No Pain:   ADL:       Therapy/Group: Individual Therapy  [name] A Faiza Bansal 10/08/2018, 4:06 PM

## 2018-10-09 ENCOUNTER — Inpatient Hospital Stay (HOSPITAL_COMMUNITY): Payer: Medicare Other | Admitting: Physical Therapy

## 2018-10-09 LAB — GLUCOSE, CAPILLARY
Glucose-Capillary: 116 mg/dL — ABNORMAL HIGH (ref 70–99)
Glucose-Capillary: 98 mg/dL (ref 70–99)
Glucose-Capillary: 77 mg/dL (ref 70–99)
Glucose-Capillary: 111 mg/dL — ABNORMAL HIGH (ref 70–99)

## 2018-10-09 NOTE — Patient Care Conference (Signed)
Inpatient RehabilitationTeam Conference and Plan of Care Update Date: 10/06/2018   Time: 10:40 AM    Patient Name: Rachel PageBarbara C Morini      Medical Record Number: [id number]  Date of Birth: May 06, 1945 Sex: Female         Room/Bed: 4W12C/4W12C-01 Payor Info: Payor: MEDICARE / Plan: MEDICARE PART A AND B / Product Type: *No Product type* /    Admitting Diagnosis: 3. CVA 1 Team  RT. CVA; 22-24days  Admit Date/Time:  09/27/2018  4:04 PM Admission Comments: No comment available   Primary Diagnosis:  <principal problem not specified> Principal Problem: <principal problem not specified>  Patient Active Problem List   Diagnosis Date Noted  . 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 Pressure injury of skin 09/21/2018  . Cerebrovascular accident (CVA) with involvement of left side of body (HCC) 09/20/2018  . Atrial fibrillation with RVR (HCC) 09/20/2018  . Hypertension   . Rhabdomyolysis   . Type 2 diabetes mellitus (HCC)   . Hyperlipidemia   . Hyperbilirubinemia   . Leukocytosis   . Hypernatremia     Expected Discharge Date: Expected Discharge Date: 10/22/18  Team Members Present: Physician leading conference: Dr. Claudette LawsAndrew Kirsteins Social Worker Present: Staci AcostaJenny Adelei Scobey, LCSW Nurse Present: Kennyth ArnoldStacey Jennings, RN PT Present: Carlynn PurlAmy Tally, PT OT Present: Roney MansJennifer Smith, OT SLP Present: Reuel DerbyHappi Overton, SLP PPS Coordinator present : Fae PippinMelissa Bowie, SLP     Current Status/Progress Goal Weekly Team Focus  Medical   Severe left hemiparesis unchanged, large left buttock decubitus, poststroke headache  Reduce skin breakdown, reduce fall risk, reduce stroke recurrence risk  Manage medications for headaches   Bowel/Bladder   Pt is continent B/B. LBM 10/04/2018.  Maintain regular bowel pattern.  Assist with toileting needs PRN.   Swallow/Nutrition/ Hydration   Dysphagia 2 with thin liquids  Supervision with least restrictive diet  completion of MBS, use of swallowing strategies    ADL's   Mod-Max A bathing w/c levell at sink, Max A UB/LB dressing.  Dependent with toilet transfers + toileint using Stefy. Max-Total A squat pivot transfers.  min A overall  NMR, self care retraining, Lt attention, functional transfers, strengthening and endurance, cognition   Mobility   max assist overall for transfers, gait @ rail 30' x2 helpers  CGA-min assist overall  transfers, pre-gait, midline orientation and postural control   Communication   Mod A at sentence level  Supervision at sentence level  speech intelligibility strategies, increasing self-awareness of decreased intelligibility   Safety/Cognition/ Behavioral Observations  Mod A for semi-complex cognition  Supervision  semi-complex problem solving, selective attention, emergent awareness   Pain   No complaints of pain.  Remain pain free.  Assess pain Q shift and PRN.   Skin   Pt has a stage I pressure injury to the lateral aspect of the L heel and an unstageable pressure injury to the L buttock.  Treat skin issues per orders.  Assess skin Q shift and PRN.    Rehab Goals Patient on target to meet rehab goals: Yes Rehab Goals Revised: none *See Care Plan and progress notes for long and short-term goals.     Barriers to Discharge  Current Status/Progress Possible Resolutions Date Resolved   Physician          Progressing towards goals very slowly  See above, continue rehab      Nursing                  PT  OT                  SLP                SW                Discharge Planning/Teaching Needs:  Pt to return to her home with her cousin and son to provide 24/7 assistance.  Niece to also come for GA to assist.  Pt's family can come in closer to d/c for training.   Team Discussion:  Pt's headaches are slightly better, but her left side is still weak.  Pt is fairly continent during the day and is calling appropriately.  Pt is mod mod to max A in w/c at the sink for bathing.  Pt has min a  goals for OT and PT.  Left inattention impacts pt.  Pt is mod to max A for txs and ambulated at the rail for 30' with 2 helpers.  PT working on txs, pregait, postural control, and reaching past midline. ST is upgrading pt to thin liquids and is also working on cognitive impairments, dysarthria, and self awareness.  Revisions to Treatment Plan:  none    Continued Need for Acute Rehabilitation Level of Care: The patient requires daily medical management by a physician with specialized training in physical medicine and rehabilitation for the following conditions: Daily direction of a multidisciplinary physical rehabilitation program to ensure safe treatment while eliciting the highest outcome that is of practical value to the patient.: Yes Daily medical management of patient stability for increased activity during participation in an intensive rehabilitation regime.: Yes Daily analysis of laboratory values and/or radiology reports with any subsequent need for medication adjustment of medical intervention for : Neurological problems   I attest that I was present, lead the team conference, and concur with the assessment and plan of the team.Team conference was held via web/ teleconference due to Britt - 19.   Demitrius Crass, Silvestre Mesi 10/09/2018, 12:20 AM

## 2018-10-09 NOTE — Progress Notes (Signed)
Social Work Patient ID: Rachel Fox, female   DOB: 1945/09/13, 73 y.o.   MRN: [id number]   CSW met with pt and spoke with her niece via telephone to update them on team conference discussion and targeted d/c date of 10-22-18.  Pt has noticed some minor improvements and remains positive and motivated to work hard.  Niece is very supportive and will come to stay with pt some and assist with her d/c home.  Family to come for family education prior to d/c.  CSW explained min A level of care will be needed at home.  Niece expressed understanding.  CSW will continue to follow and assist as needed.

## 2018-10-09 NOTE — Progress Notes (Signed)
Rachel Fox is a 73 y.o. female who is admitted for CIR with functional deficits due to left hemiparesis dysphasia and visual-spatial deficits following right MCA infarction  Past Medical History:  Diagnosis Date  . High cholesterol   . Hypertension   . Type 2 diabetes mellitus (HCC)      Subjective: No new complaints. No new problems. Slept well.   Objective: Vital signs in last 24 hours: Temp:  [98.2 F (36.8 C)-98.6 F (37 C)] 98.5 F (36.9 C) (07/25 0501) Pulse Rate:  [48-90] 66 (07/25 0501) Resp:  [15-18] 17 (07/25 0501) BP: (62-123)/(26-78) 121/78 (07/25 0501) SpO2:  [100 %] 100 % (07/25 0501) Weight change:  Last BM Date: 10/08/18  Intake/Output from previous day: 07/24 0701 - 07/25 0700 In: 720 [P.O.:720] Out: -  Last cbgs: CBG (last 3)  Recent Labs    10/08/18 1654 10/08/18 2136 10/09/18 0617  GLUCAP 113* 118* 116*   Patient Vitals for the past 24 hrs:  BP Temp Temp src Pulse Resp SpO2  10/09/18 0501 121/78 98.5 F (36.9 C) Oral 66 17 100 %  10/08/18 1918 114/64 98.6 F (37 C) Oral 60 15 100 %  10/08/18 1432 (!) 112/58 98.2 F (36.8 C) - 71 18 100 %  10/08/18 1049 123/70 - - 81 - 100 %  10/08/18 1045 105/67 - - 90 - 100 %  10/08/18 1041 (!) 69/41 - - (!) 48 - -  10/08/18 1040 (!) 62/26 - - 65 - -     Physical Exam General: No apparent distress obese HEENT: not dry Lungs: Normal effort. Lungs clear to auscultation, no crackles or wheezes. Cardiovascular: Regular rate and rhythm, no edema Abdomen: S/NT/ND; BS(+) Musculoskeletal:  Unchanged; left ankle brace Neurological: No new neurological deficits with dense left hemiparesis Wounds: Dressing in place over left buttocks decubitus Skin: clear   Mental state: Alert, oriented, cooperative    Lab Results: BMET    Component Value Date/Time   NA 144 10/08/2018 1102   K 3.6 10/08/2018 1102   CL 112 (H) 10/08/2018 1102   CO2 23 10/08/2018 1102   GLUCOSE 105 (H) 10/08/2018 1102   BUN 21  10/08/2018 1102   CREATININE 1.11 (H) 10/08/2018 1102   CALCIUM 10.4 (H) 10/08/2018 1102   GFRNONAA 50 (L) 10/08/2018 1102   GFRAA 58 (L) 10/08/2018 1102   CBC    Component Value Date/Time   WBC 10.1 10/08/2018 1102   RBC 5.24 (H) 10/08/2018 1102   HGB 14.5 10/08/2018 1102   HCT 45.2 10/08/2018 1102   HCT 44.1 09/21/2018 1120   PLT 383 10/08/2018 1102   MCV 86.3 10/08/2018 1102   MCH 27.7 10/08/2018 1102   MCHC 32.1 10/08/2018 1102   RDW 14.2 10/08/2018 1102   LYMPHSABS 1.8 09/28/2018 0538   MONOABS 1.5 (H) 09/28/2018 0538   EOSABS 0.1 09/28/2018 0538   BASOSABS 0.0 09/28/2018 0538     Medications: I have reviewed the patient's current medications.  Assessment/Plan:  Functional deficits following right MCA infarction.  Continue CRR DVT prophylaxis continue Eliquis Left buttock decubitus.  Continue Santyl and daily dressing changes.  General surgery to evaluate History of atrial fibrillation continue amiodarone.  Follow-up cardiology Diabetes mellitus.  Stable glycemic control Dyslipidemia continue statin therapy   Length of stay, days: 12  [name] , MD 10/09/2018, 9:48 AM

## 2018-10-09 NOTE — Progress Notes (Signed)
Physical Therapy Session Note  Patient Details  Name: Rachel Fox MRN: [id number] Date of Birth: 02/01/1946  Today's Date: 10/09/2018 PT Individual Time: 1010-1110 PT Individual Time Calculation (min): 60 min   Short Term Goals: Week 2:  PT Short Term Goal 1 (Week 2): Pt will perform bed mobility with min assist consistently PT Short Term Goal 2 (Week 2): Pt will ambulate 50ft with mod assist and LRAD PT Short Term Goal 3 (Week 2): Pt will maintain standing balance x 2 minutes with min A from PT  Skilled Therapeutic Interventions/Progress Updates:   Pt received sitting in WC and agreeable to PT. Transported to day room in WC. Stand pivot transfer to nustep, LLE buckling on this day requiring max assist to prevent L lateral LOB.   nustep 5 min + 3 min level 1>3 BLE only, cues for full ROM, midline orientation, and symmetry of movement.  Sit<>stand from mat table with mod assist x 3. Standing balacne with max assist to force R weight shift and prevent L knee collapse.   Gait training with RW  X 10 ft with max assist to prevent L lateral LOB and improve R weight shift, decreased ability to attain midline on this day.   stedy transfer to mat table with min assist. Sitting balance and deep core, trunk NMR to perform trunk rotation R and L with 5 sec hold at end range, min-mod assist for increased ROM and R weight shift to maintian midline. Lateral/anterior reach with the RUE to force L side trunk activation x 12, with 3 sec h olod at end range. Improved posture in sitting following trunk NMR. Squat pivot transfer to WC with mod assist and max cues to pelvic movement and sequencing.   Patient returned to room and left sitting in WC with call bell in reach and all needs met.         Therapy Documentation Precautions:  Precautions Precautions: Fall Precaution Comments: L side weak/flaccid, right gaze preference, L shoulder pain (risk for subluxation), pressure injury from laying on floor  PTA Restrictions Weight Bearing Restrictions: No Pain: Pain Assessment Pain Scale: 0-10 Pain Score: 0-No pain Pain Type: Acute pain Pain Location: Shoulder Pain Orientation: Left Pain Frequency: Intermittent Pain Onset: Gradual Pain Intervention(s): Medication (See eMAR);Repositioned;Cutaneous stimulation    Therapy/Group: Individual Therapy   E  10/09/2018, 11:12 AM  

## 2018-10-10 LAB — GLUCOSE, CAPILLARY
Glucose-Capillary: 114 mg/dL — ABNORMAL HIGH (ref 70–99)
Glucose-Capillary: 86 mg/dL (ref 70–99)
Glucose-Capillary: 120 mg/dL — ABNORMAL HIGH (ref 70–99)
Glucose-Capillary: 133 mg/dL — ABNORMAL HIGH (ref 70–99)

## 2018-10-10 NOTE — Progress Notes (Signed)
Rachel Fox is a 73 y.o. female admitted for CIR with functional deficits including left hemiparesis and dysphasia as well as visual spatial deficits following right MCA infarction  Past Medical History:  Diagnosis Date  . High cholesterol   . Hypertension   . Type 2 diabetes mellitus (HCC)      Subjective: Remains alert and offering no new complaints.  Objective: Vital signs in last 24 hours: Temp:  [97.3 F (36.3 C)-97.9 F (36.6 C)] 97.3 F (36.3 C) (07/25 1917) Pulse Rate:  [50-81] 81 (07/25 2050) Resp:  [16-18] 18 (07/25 1917) BP: (117-122)/(49-68) 117/62 (07/25 2050) SpO2:  [99 %-100 %] 100 % (07/25 2050) Weight change:  Last BM Date: 10/09/18  Intake/Output from previous day: 07/25 0701 - 07/26 0700 In: 838 [P.O.:838] Out: 225 [Urine:225] Last cbgs: CBG (last 3)  Recent Labs    10/09/18 1628 10/09/18 2126 10/10/18 0601  GLUCAP 77 111* 114*   Patient Vitals for the past 24 hrs:  BP Temp Pulse Resp SpO2  10/09/18 2050 117/62 - 81 - 100 %  10/09/18 1917 (!) 122/49 (!) 97.3 F (36.3 C) (!) 50 18 99 %  10/09/18 1358 119/68 97.9 F (36.6 C) (!) 56 16 100 %     Physical Exam General: No apparent distress   HEENT: Well-hydrated Lungs: Normal effort. Lungs clear to auscultation, no crackles or wheezes. Cardiovascular: Regular rate and rhythm, no edema Abdomen: S/NT/ND; BS(+) Musculoskeletal:  unchanged Neurological: No new neurological deficits with dense left hemiparesis Wounds: Dressing over left buttock decubitus Extremities.  Left ankle brace Mental state: Alert, oriented, cooperative    Lab Results: BMET    Component Value Date/Time   NA 144 10/08/2018 1102   K 3.6 10/08/2018 1102   CL 112 (H) 10/08/2018 1102   CO2 23 10/08/2018 1102   GLUCOSE 105 (H) 10/08/2018 1102   BUN 21 10/08/2018 1102   CREATININE 1.11 (H) 10/08/2018 1102   CALCIUM 10.4 (H) 10/08/2018 1102   GFRNONAA 50 (L) 10/08/2018 1102   GFRAA 58 (L) 10/08/2018 1102    CBC    Component Value Date/Time   WBC 10.1 10/08/2018 1102   RBC 5.24 (H) 10/08/2018 1102   HGB 14.5 10/08/2018 1102   HCT 45.2 10/08/2018 1102   HCT 44.1 09/21/2018 1120   PLT 383 10/08/2018 1102   MCV 86.3 10/08/2018 1102   MCH 27.7 10/08/2018 1102   MCHC 32.1 10/08/2018 1102   RDW 14.2 10/08/2018 1102   LYMPHSABS 1.8 09/28/2018 0538   MONOABS 1.5 (H) 09/28/2018 0538   EOSABS 0.1 09/28/2018 0538   BASOSABS 0.0 09/28/2018 0538     Medications: I have reviewed the patient's current medications.  Assessment/Plan:  Functional deficits following right MCA infarction.  Continue CIR History of atrial fibrillation.  Remains on amiodarone.  Follow-up cardiology DVT prophylaxis continue Eliquis Diabetes mellitus.  Remains well controlled Dyslipidemia continue statin therapy Left buttock decubitus.  Continue Santyl and dressing changes.  General surgery to evaluate    Length of stay, days: 13  [name] , MD 10/10/2018, 9:11 AM

## 2018-10-10 NOTE — Progress Notes (Signed)
Pt refused wound care, states that she prefers a female nurse to do the dressing change. Will continue to monitor.

## 2018-10-11 ENCOUNTER — Inpatient Hospital Stay (HOSPITAL_COMMUNITY): Payer: Medicare Other

## 2018-10-11 ENCOUNTER — Inpatient Hospital Stay (HOSPITAL_COMMUNITY): Payer: Medicare Other | Admitting: Physical Therapy

## 2018-10-11 ENCOUNTER — Inpatient Hospital Stay (HOSPITAL_COMMUNITY): Payer: Medicare Other | Admitting: Occupational Therapy

## 2018-10-11 LAB — GLUCOSE, CAPILLARY
Glucose-Capillary: 129 mg/dL — ABNORMAL HIGH (ref 70–99)
Glucose-Capillary: 72 mg/dL (ref 70–99)
Glucose-Capillary: 99 mg/dL (ref 70–99)
Glucose-Capillary: 144 mg/dL — ABNORMAL HIGH (ref 70–99)

## 2018-10-11 NOTE — Progress Notes (Signed)
[place] PHYSICAL MEDICINE & REHABILITATION PROGRESS NOTE   Subjective/Complaints:  No issues overnite.     Review of systems difficult to obtained due to cognition poor awareness of deficits.   Objective:   No results found. Recent Labs    10/08/18 1102  WBC 10.1  HGB 14.5  HCT 45.2  PLT 383   Recent Labs    10/08/18 1102  NA 144  K 3.6  CL 112*  CO2 23  GLUCOSE 105*  BUN 21  CREATININE 1.11*  CALCIUM 10.4*    Intake/Output Summary (Last 24 hours) at 10/11/2018 0801 Last data filed at 10/11/2018 0038 Gross per 24 hour  Intake 779 ml  Output 150 ml  Net 629 ml     Physical Exam: Vital Signs Blood pressure 117/73, pulse 71, temperature 98.3 F (36.8 C), temperature source Oral, resp. rate 18, height 5\' 3"  (1.6 m), weight 72 kg, SpO2 99 %.   General: No acute distress Mood and affect are appropriate Heart: Regular rate and rhythm no rubs murmurs or extra sounds Lungs: Clear to auscultation, breathing unlabored, no rales or wheezes Abdomen: Positive bowel sounds, soft nontender to palpation, nondistended Extremities: No clubbing, cyanosis, or edema Skin: Dressing on left buttocks decubitus   Left neglect noted  Neurologic: Cranial nerves II through XII intact, motor strength is 5/5 in right deltoid, bicep, tricep, grip, hip flexor, knee extensors, ankle dorsiflexor and plantar flexor 0/5 left upper extremity deltoid bicep tricep grip Trace hip knee extensor synergy in the left lower extremity and aduction  Sensation absent light touch in the left upper extremity Intact light touch left lower extremity  Cerebellar exam unable to perform on left side due to weakness musculoskeletal: Full range of motion in all 4 extremities. No joint swelling    Assessment/Plan: 1. Functional deficits secondary to right MCA infarct which require 3+ hours per day of interdisciplinary therapy in a comprehensive inpatient rehab setting.  Physiatrist is providing close  team supervision and 24 hour management of active medical problems listed below.  Physiatrist and rehab team continue to assess barriers to discharge/monitor patient progress toward functional and medical goals  Care Tool:  Bathing    Body parts bathed by patient: Left arm, Abdomen, Chest, Front perineal area, Left upper leg, Right upper leg, Face   Body parts bathed by helper: Right arm, Buttocks, Right lower leg, Left lower leg     Bathing assist Assist Level: Moderate Assistance - Patient 50 - 74%     Upper Body Dressing/Undressing Upper body dressing   What is the patient wearing?: Pull over shirt    Upper body assist Assist Level: Moderate Assistance - Patient 50 - 74%    Lower Body Dressing/Undressing Lower body dressing      What is the patient wearing?: Pants, Incontinence brief     Lower body assist Assist for lower body dressing: Maximal Assistance - Patient 25 - 49%     Toileting Toileting    Toileting assist Assist for toileting: Maximal Assistance - Patient 25 - 49%     Transfers Chair/bed transfer  Transfers assist  Chair/bed transfer activity did not occur: Safety/medical concerns  Chair/bed transfer assist level: Moderate Assistance - Patient 50 - 74%     Locomotion Ambulation   Ambulation assist   Ambulation activity did not occur: Safety/medical concerns  Assist level: 2 helpers Assistive device: Walker-rolling Max distance: 10   Walk 10 feet activity   Assist  Walk 10 feet activity did not  occur: Safety/medical concerns  Assist level: 2 helpers Assistive device: Walker-rolling   Walk 50 feet activity   Assist Walk 50 feet with 2 turns activity did not occur: Safety/medical concerns         Walk 150 feet activity   Assist Walk 150 feet activity did not occur: Safety/medical concerns         Walk 10 feet on uneven surface  activity   Assist Walk 10 feet on uneven surfaces activity did not occur: Safety/medical  concerns         Wheelchair     Assist Will patient use wheelchair at discharge?: Yes Type of Wheelchair: Manual    Wheelchair assist level: Minimal Assistance - Patient > 75% Max wheelchair distance: 50 ft    Wheelchair 50 feet with 2 turns activity    Assist        Assist Level: Minimal Assistance - Patient > 75%   Wheelchair 150 feet activity     Assist Wheelchair 150 feet activity did not occur: Safety/medical concerns        Medical Problem List and Plan: 1.Lefthemiparesis, dysphagia, and visual-spatial deficitssecondary to right MCA infarction --Continue CIR therapies including PT, OT, and SLP   -PRAFO LLE 2. Antithrombotics: -DVT/anticoagulation:Eliquison board -antiplatelet therapy: N/A 3. Pain Management:Tylenol as needed- added topamax for HA with some benefit increased to BID- improving    -continue muscle rub for left shoulder -maintain proper shoulder position and support when in bed and chair. Discussed with patient again today 4. Mood:Provide emotional support -antipsychotic agents: N/A 5. Neuropsych: This patientiscapable of making decisions on herown behalf. 6. Skin/Wound Care:Routine skin checks. Santylleft buttock daily covered with foam dressing.Wound deeper, ask Gen surg to eval 7. Fluids/Electrolytes/Nutrition:Routine in and outs with follow-up chemistrieson admit, low albumin 2.6 monitor encourage p.o. intake, using  pro-stat 8.Atrial fibrillation. Amiodarone 200 mg twice daily, Lanoxin 0.125 mg daily, Cardizem 90 mg 4 times daily. Cardiac rate controlled. Follow cardiology services Vitals:   10/10/18 1907 10/11/18 0429  BP: 116/64 117/73  Pulse: 63 71  Resp: 17 18  Temp: 98.3 F (36.8 C) 98.3 F (36.8 C)  SpO2: 100% 99%  HR controlled 7/27, blood pressure is normal  9. Dysphagia. Dysphagia #2, thin liquids -encourage  fluids, no signs of PNA  10. Diabetes mellitus. Hemoglobin A1c 6.1. SSI. Patient on Glucophage 500 mg twice daily prior to admission. Resume as indicated 11.Rhabdomyolysis. no sign of renal failure  12. Hyperlipidemia. Lipitor 13. Constipation. Laxative assistance  14.  Leukocytosis not febrile, ua equivocal, ucx multispecies  11.4K improving LOS: 14 days A FACE TO FACE EVALUATION WAS PERFORMED  Rachel Fox E Rachel Fox 10/11/2018, 8:01 AM

## 2018-10-11 NOTE — Progress Notes (Signed)
Speech Language Pathology Daily Session Note  Patient Details  Name: Rachel Fox MRN: [id number] Date of Birth: 10-29-1945  Today's Date: 10/11/2018 SLP Individual Time: 1102-1205 SLP Individual Time Calculation (min): 63 min  Short Term Goals: Week 2: SLP Short Term Goal 1 (Week 2): Patient will consume Dys. 2 textures and thin liquids via cup with efficient mastication and complete oral clearance without overt s/s of aspiration with Supervision verbal cues for use of swallowing compensatory strategies. SLP Short Term Goal 2 (Week 2): Patient will demonstrate functional problem solving for basic and familiar tasks with Min A verbal cues. SLP Short Term Goal 3 (Week 2): Patient will utilize memory compensatory strategies to recall new, daily information with Min A verbal cues. SLP Short Term Goal 4 (Week 2): Patient will self-monitor and correct errors during functional tasks with Min verbal and visual cues. SLP Short Term Goal 5 (Week 2): Patient will utilize speech intelligibility strategies at the sentence level with Mod A verbal cues to achieve ~80% intelligibility.  Skilled Therapeutic Interventions:Skilled ST services focused on swallow and speech skills. SLP facilitated awareness of reduced speech intelligibility primarily d/t cognitively controled/awareness in reduction of vocal intensity, utilizing recording of picture description tasks as well  in simple conversation on pt' phone, which was reviewed and then deleted (obeserved by both pt and SLP.)  Pt demonstrated initial overall awareness of reduced vocal intensity prior to task, however expressed "surprise" to how "quiet" her voice was following review of recordings. Pt demonstrated demonstrated 70% intelligibility at sentence level during recordings and after recordings 80% intelligibility with max A verbal cues. Pt was instructed to implement strategies during daily interactions.   SLP ordered an early lunch tray, however was not  delivered during ST treatment session. Pt consumed dys 3 trial snack with supervision A verbal cues to clear oral residue. Pt requested to use bathroom, SLP and NT assisted, pt was unable to void and then returned to bed. Pt was left in room with call bell within reach and bed alarm set. ST recommends to continue skilled ST services.      Pain Pain Assessment Pain Score: 0-No pain  Therapy/Group: Individual Therapy  Booker Bhatnagar  [facility] 10/11/2018, 2:29 PM

## 2018-10-11 NOTE — Progress Notes (Signed)
Occupational Therapy Session Note  Patient Details  Name: Rachel Fox MRN: [id number] Date of Birth: 10/31/1945  Today's Date: 10/11/2018 OT Individual Time: [phone] OT Individual Time Calculation (min): 57 min   Skilled Therapeutic Interventions/Progress Updates:    Pt greeted in bed, eating breakfast. NT present to supervise. OT resumed supervision role for remainder of meal. Placed her tray on Lt of midline to promote head turns and Lt scanning. Vcs for for tongue sweeps to address Lt pocketing. OT also provided gentle stretching to forearm at this time due to hypertonicity in the elbow. Supine<sit completed with Mod A and HOB elevated. While EOB, worked on sitting balance while RN changed her soiled wound dressing. Close supervision for static sitting, Mod A for controlled anterior weight shift forward. Mod-Max A for squat pivot<w/c towards unaffected side. She refused to stand to participate in grooming tasks, opting instead to sit. HOH for integrating Lt during oral care and handwashing. Pt did initiate lifting her Lt arm onto the sink to wash limb. During UB bathing tasks, HOH utilized again for inclusion of affected UE. She did not want to bathe LB. Mod A for donning overhead shirt using hemi techniques. Pt able to obtain and maintain figure 4 position with R LE for OT to thread pants and don footwear. Needed assist to obtain figure 4 with Lt, however better able to tolerate this position compared to session last week. Pt able to stabilize her affected LE over Rt knee while OT assisted with LB tasks. Total A for Teds for BP mgt. Mod A for sit<stand and static standing balance while OT elevated pants over hips. Manual assist for preventing Lt knee from buckling. While sitting, pt applied lotion to arms with vcs for Lt attention and HOH for Lt inclusion. Pt remained sitting in w/c at end of session, left with all needs within reach and safety belt fastened. Tx focus placed on Lt attention, NMR,  sit<stands, and ADL retraining.    Therapy Documentation Precautions:  Precautions Precautions: Fall Precaution Comments: L side weak/flaccid, right gaze preference, L shoulder pain (risk for subluxation), pressure injury from laying on floor PTA Restrictions Weight Bearing Restrictions: No Vital Signs: Therapy Vitals Temp: 98.8 F (37.1 C) Pulse Rate: 60 Resp: 18 BP: (!) 101/55 Patient Position (if appropriate): Lying Oxygen Therapy SpO2: 100 % O2 Device: Room Air Pain: In L UE. RN provided pain medication during session  Pain Assessment Pain Scale: 0-10 Pain Score: 7  Pain Location: Shoulder Pain Orientation: Left Patients Stated Pain Goal: 5 Pain Intervention(s): Medication (See eMAR) ADL:   :     Therapy/Group: Individual Therapy  Noreta Kue A Tamarion Haymond 10/11/2018, 4:33 PM

## 2018-10-11 NOTE — Progress Notes (Signed)
Physical Therapy Session Note  Patient Details  Name: Rachel Fox MRN: [id number] Date of Birth: 1945-07-08  Today's Date: 10/11/2018 PT Individual Time: 7782-4235 PT Individual Time Calculation (min): 69 min   Short Term Goals: Week 2:  PT Short Term Goal 1 (Week 2): Pt will perform bed mobility with min assist consistently PT Short Term Goal 2 (Week 2): Pt will ambulate 65ft with mod assist and LRAD PT Short Term Goal 3 (Week 2): Pt will maintain standing balance x 2 minutes with min A from PT  Skilled Therapeutic Interventions/Progress Updates:  Pt received in bed reporting 7-8/10 pain in L shoulder & pain meds requested & administered by RN during session. Pt is able to transfer from R sidelying to sitting EOB with min assist with cuing for compensatory technique and improving ability to use RLE to assist LLE to EOB. Pt requires +2 assist to safely stand pivot to w/c on L and is transported to gym via w/c dependent assist. Pt is able to transfer w/c>mat table with max assist with max cuing for stand pivot technique. Pt transfers sit<>stand from EOM with mod assist and engages in standing and reaching for objects with task focusing on weight shifting in all directions, LLE strengthening & NMR, midline orientation & balance with pt requiring up to max assist for task. Pt transferred to tall kneeling on EOM table with bench & +2 assist with pt reporting L knee pain during first attempt so used pillow for increased cushion & pt able to tolerate 2nd attempt without c/o. Pt requires up max assist for balance with MAX cuing for upright posture and to activate hip extensors to increase upright posture during tall kneeling; pt able to tolerate position ~2 minutes. Pt transferred sit>L sidelying>supine on mat table with mod assist for sit>sidelying and multimodal cuing for compensatory technique. While supine on mat table pt performed BLE bridging with multimodal cuing for technique with pt  demonstrating impaired ability to activate hip extensors and minimal clearance from mat table. Progressed to LLE single leg bridging for further strengthening & NMR with multimodal cuing. Pt transferred L sidelying>sitting EOM with min assist with ongoing cuing for technique with pt reporting feeling "woozy" but states it ceases within <1 minute; pt returned to chair w/c stand pivot mod assist. Returned pt to room & pt transfers to EOB in same manner - therapist provides education for need to fuly pivot to surface before sitting as pt frequently sits early on edge of seat during all transfers. Pt requires mod assist for sit>R sidelying for BLE. Vitals checked: BP = 101/55 mmHg (LUE) supine, HR = 60 bpm, SpO2 = 100% on room air. Positioned pt in R sidelying with pillows in hemi technique and to alleviate pressure on buttocks. Pt left in bed with call bell in reach.  Therapy Documentation Precautions:  Precautions Precautions: Fall Precaution Comments: L side weak/flaccid, right gaze preference, L shoulder pain (risk for subluxation), pressure injury from laying on floor PTA Restrictions Weight Bearing Restrictions: No   Therapy/Group: Individual Therapy  Waunita Schooner 10/11/2018, 3:33 PM

## 2018-10-12 ENCOUNTER — Inpatient Hospital Stay (HOSPITAL_COMMUNITY): Payer: Medicare Other | Admitting: *Deleted

## 2018-10-12 ENCOUNTER — Inpatient Hospital Stay (HOSPITAL_COMMUNITY): Payer: Medicare Other | Admitting: Occupational Therapy

## 2018-10-12 ENCOUNTER — Inpatient Hospital Stay (HOSPITAL_COMMUNITY): Payer: Medicare Other

## 2018-10-12 LAB — GLUCOSE, CAPILLARY
Glucose-Capillary: 107 mg/dL — ABNORMAL HIGH (ref 70–99)
Glucose-Capillary: 123 mg/dL — ABNORMAL HIGH (ref 70–99)
Glucose-Capillary: 115 mg/dL — ABNORMAL HIGH (ref 70–99)
Glucose-Capillary: 126 mg/dL — ABNORMAL HIGH (ref 70–99)

## 2018-10-12 NOTE — Progress Notes (Signed)
Occupational Therapy Session Note  Patient Details  Name: Rachel Fox MRN: [id number] Date of Birth: 14-May-1945  Today's Date: 10/12/2018 OT Individual Time: 8546-2703 OT Individual Time Calculation (min): 57 min    Short Term Goals: Week 2:  OT Short Term Goal 1 (Week 2): Pt will be able to complete a squat pivot transfer to the toilet with min A (vs using the stedy) OT Short Term Goal 2 (Week 2): Pt will be able to don shirt with min A. OT Short Term Goal 3 (Week 2): Pt will be able to bathe with min A. OT Short Term Goal 4 (Week 2): Pt will be able to sit to stand during LB self care with min A. OT Short Term Goal 5 (Week 2): Pt will be able to maintain stand balance with mod A while pulling pants up and down with 25% or less A.  Skilled Therapeutic Interventions/Progress Updates:    Upon entering the room, pt supine in bed with c/o headache pain. RN notified and medication given this session. Pt disoriented and unable to recall therapy session prior to this therapist arrival. Pt requesting to use bathroom for sudden urge to have BM with STEDY being utilized for time. Supine >sit with mod A to EOB. Pt standing into stedy with mod lifting assistance. Pt able to have BM and needing total A for hygiene and LB clothing management. Pt transferred into wheelchair and seated at sink for UB self care and grooming tasks. OT placing all self care items on L side of sink for visual scanning with pt needing min cuing to attend to L side. Focus on hemiplegic dressing technique with pt continuing to need mod A to fully onto L UE and down trunk. Pt seated for grooming tasks with min cuing to utilize mirror for visual feedback regarding midline orientation. Pt remained seated in wheelchair with chair alarm belt donned and activated. Call bell within reach.   Therapy Documentation Precautions:  Precautions Precautions: Fall Precaution Comments: L side weak/flaccid, right gaze preference, L shoulder  pain (risk for subluxation), pressure injury from laying on floor PTA Restrictions Weight Bearing Restrictions: No General:   Vital Signs:  Pain: Pain Assessment Pain Scale: 0-10 Pain Score: 6  Pain Type: Acute pain Pain Location: Head Pain Descriptors / Indicators: Headache Pain Frequency: Occasional Pain Onset: On-going Patients Stated Pain Goal: 0 Pain Intervention(s): Medication (See eMAR) Multiple Pain Sites: No   Therapy/Group: Individual Therapy  Gypsy Decant 10/12/2018, 11:14 AM

## 2018-10-12 NOTE — Progress Notes (Signed)
[place] PHYSICAL MEDICINE & REHABILITATION PROGRESS NOTE   Subjective/Complaints:  No issues overnite    Review of systems difficult to obtained due to cognition poor awareness of deficits.   Objective:   Dg Knee 1-2 Views Left  Result Date: 10/11/2018 CLINICAL DATA:  Left knee pain EXAM: LEFT KNEE - 1-2 VIEW COMPARISON:  None. FINDINGS: No fracture or malalignment. Mild patellofemoral and medial joint space degenerative change. Faint joint space calcification. No significant knee effusion. IMPRESSION: 1. No acute osseous abnormality. 2. Mild degenerative changes.  Chondrocalcinosis. Electronically Signed   By: Jasmine PangKim  Fujinaga M.D.   On: 10/11/2018 19:29   No results for input(s): WBC, HGB, HCT, PLT in the last 72 hours. No results for input(s): NA, K, CL, CO2, GLUCOSE, BUN, CREATININE, CALCIUM in the last 72 hours.  Intake/Output Summary (Last 24 hours) at 10/12/2018 0725 Last data filed at 10/12/2018 0659 Gross per 24 hour  Intake 320 ml  Output 200 ml  Net 120 ml     Physical Exam: Vital Signs Blood pressure 120/72, pulse (!) 49, temperature 98.3 F (36.8 C), resp. rate 18, height 5\' 3"  (1.6 m), weight 72 kg, SpO2 99 %.   General: No acute distress Mood and affect are appropriate Heart: Regular rate and rhythm no rubs murmurs or extra sounds Lungs: Clear to auscultation, breathing unlabored, no rales or wheezes Abdomen: Positive bowel sounds, soft nontender to palpation, nondistended Extremities: No clubbing, cyanosis, or edema Skin: Dressing on left buttocks decubitus   Left neglect noted  Neurologic: Cranial nerves II through XII intact, motor strength is 5/5 in right deltoid, bicep, tricep, grip, hip flexor, knee extensors, ankle dorsiflexor and plantar flexor 0/5 left upper extremity deltoid bicep tricep grip Trace hip knee extensor synergy in the left lower extremity and aduction  Sensation absent light touch in the left upper extremity Intact light touch left  lower extremity  Cerebellar exam unable to perform on left side due to weakness musculoskeletal: Full range of motion in all 4 extremities. No joint swelling    Assessment/Plan: 1. Functional deficits secondary to right MCA infarct which require 3+ hours per day of interdisciplinary therapy in a comprehensive inpatient rehab setting.  Physiatrist is providing close team supervision and 24 hour management of active medical problems listed below.  Physiatrist and rehab team continue to assess barriers to discharge/monitor patient progress toward functional and medical goals  Care Tool:  Bathing    Body parts bathed by patient: Left arm, Abdomen, Chest, Front perineal area, Left upper leg, Right upper leg, Face   Body parts bathed by helper: Right arm, Buttocks, Right lower leg, Left lower leg     Bathing assist Assist Level: Moderate Assistance - Patient 50 - 74%     Upper Body Dressing/Undressing Upper body dressing   What is the patient wearing?: Pull over shirt    Upper body assist Assist Level: Moderate Assistance - Patient 50 - 74%    Lower Body Dressing/Undressing Lower body dressing      What is the patient wearing?: Pants     Lower body assist Assist for lower body dressing: Maximal Assistance - Patient 25 - 49%     Toileting Toileting    Toileting assist Assist for toileting: Maximal Assistance - Patient 25 - 49%     Transfers Chair/bed transfer  Transfers assist  Chair/bed transfer activity did not occur: Safety/medical concerns  Chair/bed transfer assist level: Maximal Assistance - Patient 25 - 49%     Locomotion  Ambulation   Ambulation assist   Ambulation activity did not occur: Safety/medical concerns  Assist level: 2 helpers Assistive device: Walker-rolling Max distance: 10   Walk 10 feet activity   Assist  Walk 10 feet activity did not occur: Safety/medical concerns  Assist level: 2 helpers Assistive device: Walker-rolling    Walk 50 feet activity   Assist Walk 50 feet with 2 turns activity did not occur: Safety/medical concerns         Walk 150 feet activity   Assist Walk 150 feet activity did not occur: Safety/medical concerns         Walk 10 feet on uneven surface  activity   Assist Walk 10 feet on uneven surfaces activity did not occur: Safety/medical concerns         Wheelchair     Assist Will patient use wheelchair at discharge?: Yes Type of Wheelchair: Manual    Wheelchair assist level: Minimal Assistance - Patient > 75% Max wheelchair distance: 50 ft    Wheelchair 50 feet with 2 turns activity    Assist        Assist Level: Minimal Assistance - Patient > 75%   Wheelchair 150 feet activity     Assist Wheelchair 150 feet activity did not occur: Safety/medical concerns        Medical Problem List and Plan: 1.Lefthemiparesis, dysphagia, and visual-spatial deficitssecondary to right MCA infarction --Continue CIR therapies including PT, OT, and SLP   -PRAFO LLE 2. Antithrombotics: -DVT/anticoagulation:Eliquison board -antiplatelet therapy: N/A 3. Pain Management:Tylenol as needed- added topamax for HA with some benefit increased to BID- improving    -knee pain left knee DJD with immobility- voltaren gel  -maintain proper shoulder position and support when in bed and chair. Discussed with patient again today 4. Mood:Provide emotional support -antipsychotic agents: N/A 5. Neuropsych: This patientiscapable of making decisions on herown behalf. 6. Skin/Wound Care:Routine skin checks. Santylleft buttock daily covered with foam dressing.Wound deeper, ask Gen surg to eval 7. Fluids/Electrolytes/Nutrition:Routine in and outs with follow-up chemistrieson admit, low albumin 2.6 monitor encourage p.o. intake, using  pro-stat 8.Atrial fibrillation. Amiodarone 200 mg twice daily, Lanoxin 0.125  mg daily, Cardizem 90 mg 4 times daily. Cardiac rate controlled. Follow cardiology services Vitals:   10/11/18 2118 10/12/18 0348  BP: 129/70 120/72  Pulse:  (!) 49  Resp:  18  Temp:  98.3 F (36.8 C)  SpO2:  99%  HR controlled 7/28, blood pressure is normal  9. Dysphagia. Dysphagia #2, thin liquids -encourage fluids, no signs of PNA  10. Diabetes mellitus. Hemoglobin A1c 6.1. SSI. Patient on Glucophage 500 mg twice daily prior to admission. Resume as indicated 11.Rhabdomyolysis. no sign of renal failure  12. Hyperlipidemia. Lipitor 13. Constipation. Laxative assistance  14.  Leukocytosis not febrile, ua equivocal, ucx multispecies  11.4K improving LOS: 15 days A FACE TO FACE EVALUATION WAS PERFORMED  Charlett Blake 10/12/2018, 7:25 AM

## 2018-10-12 NOTE — Progress Notes (Signed)
Physical Therapy Session Note  Patient Details  Name: Rachel Fox MRN: [id number] Date of Birth: 05/10/1945  Today's Date: 10/12/2018 PT Individual Time: 2119-4174 PT Individual Time Calculation (min): 72 min   Short Term Goals: Week 2:  PT Short Term Goal 1 (Week 2): Pt will perform bed mobility with min assist consistently PT Short Term Goal 2 (Week 2): Pt will ambulate 19f with mod assist and LRAD PT Short Term Goal 3 (Week 2): Pt will maintain standing balance x 2 minutes with min A from PT  Skilled Therapeutic Interventions/Progress Updates: Pt presented in w/c agreeable to therapy. Pt denies pain at start of session however c/o shoulder pain during session with nsg notified (unable to receive pain meds until end of session). Pt instructed in engaging/releasing brakes and was able to demonstrate with vrebal cues locking brakes. Pt transported to rehab gym for energy conservation and performed squat pivot transfer modA to R. Pt participated in x bouts of horseshoes with RW and mod multimodal cues for maintaining midline due to L lateral lean. Pt was able to follow verbal cues and engage L quad to improve midline orientation. Participated in STS with mirror feedback with modA and no AD for improve wt shifting to R. Pt able to perform x 4 with modA and fair carryover. On last trial pt requiring maxA and difficultly maintaining erect posture. Pt stating just feeling fatigued no sx. After therapeutic break pt ambulated x866fwith RW maxA with RW. Pt able to advance LLE with PTA providing facilitation of R wt shifting and cues for engaging LLE in stance phase and PTA blocking L knee. Transported pt back to room and performed squat pivot transfer to R back to bed modA. Pt required min/modA for sit to supine and was able to perform [facility] blocking R foot and using RUE with bed rail to assist. Pt left in bed with call bell within reach and needs met.      Therapy  Documentation Precautions:  Precautions Precautions: Fall Precaution Comments: L side weak/flaccid, right gaze preference, L shoulder pain (risk for subluxation), pressure injury from laying on floor PTA Restrictions Weight Bearing Restrictions: No General:   Vital Signs: Therapy Vitals Temp: 98.1 F (36.7 C) Temp Source: Oral Pulse Rate: 90 Resp: 19 BP: 119/65 Patient Position (if appropriate): Lying Oxygen Therapy SpO2: 93 % O2 Device: Room Air Pain: Pain Assessment Pain Scale: 0-10 Pain Score: 6  Pain Type: Acute pain Pain Location: Arm Pain Orientation: Left Pain Descriptors / Indicators: Aching Pain Frequency: Intermittent Pain Onset: On-going Patients Stated Pain Goal: 1 Pain Intervention(s): Medication (See eMAR);Repositioned Multiple Pain Sites: No    Therapy/Group: Individual Therapy  Joon Pohle  Ikram Riebe, PTA  10/12/2018, 3:56 PM

## 2018-10-12 NOTE — Progress Notes (Signed)
Speech Language Pathology Daily Session Note  Patient Details  Name: Rachel Fox MRN: [id number] Date of Birth: December 05, 1945  Today's Date: 10/12/2018 SLP Individual Time: 0900-0958 SLP Individual Time Calculation (min): 58 min  Short Term Goals: Week 2: SLP Short Term Goal 1 (Week 2): Patient will consume Dys. 2 textures and thin liquids via cup with efficient mastication and complete oral clearance without overt s/s of aspiration with Supervision verbal cues for use of swallowing compensatory strategies. SLP Short Term Goal 2 (Week 2): Patient will demonstrate functional problem solving for basic and familiar tasks with Min A verbal cues. SLP Short Term Goal 3 (Week 2): Patient will utilize memory compensatory strategies to recall new, daily information with Min A verbal cues. SLP Short Term Goal 4 (Week 2): Patient will self-monitor and correct errors during functional tasks with Min verbal and visual cues. SLP Short Term Goal 5 (Week 2): Patient will utilize speech intelligibility strategies at the sentence level with Mod A verbal cues to achieve ~80% intelligibility.  Skilled Therapeutic Interventions: Skilled ST services focused on speech and cognitive skills. Pt expressed disorientation, requesting where we were in the hospital, SLP assisted pt in located visual aids within room. Pt we orientated to place, city, situation, month, year and day of week. SLP facilitated basic problem solving, emergent awareness and recall with novel card task played at simplest level, pt required min A fade to supervision A cues (1/2 way through task) for problem solving/error awareness and supervision A verbal cues for recall of three rules with visual aid. SLP also facilitated novel card task played in semi-complex manner, however pt required max A verbal cues for planning ahead and problem solving. Pt requested to use bathroom, SLP assisted with stedy, pt voided and returned to bed. Pt demonstrated no  awareness of reduce vocal intensity during treatment session and required max A verbal cues for 80% intelligibility at sentence level. Pt was left in room with call bell within reach and bed alarm set. ST recommends to continue skilled ST services.      Pain Pain Assessment Pain Scale: 0-10 Pain Score: 0-No pain Pain Type: Acute pain Pain Location: Head Pain Descriptors / Indicators: Headache Pain Frequency: Occasional Pain Onset: On-going Patients Stated Pain Goal: 0 Pain Intervention(s): Medication (See eMAR) Multiple Pain Sites: No  Therapy/Group: Individual Therapy  Jiayi Lengacher  [facility] 10/12/2018, 11:25 AM

## 2018-10-13 ENCOUNTER — Inpatient Hospital Stay (HOSPITAL_COMMUNITY): Payer: Medicare Other | Admitting: Occupational Therapy

## 2018-10-13 ENCOUNTER — Inpatient Hospital Stay (HOSPITAL_COMMUNITY): Payer: Medicare Other

## 2018-10-13 ENCOUNTER — Inpatient Hospital Stay (HOSPITAL_COMMUNITY): Payer: Medicare Other | Admitting: Physical Therapy

## 2018-10-13 LAB — GLUCOSE, CAPILLARY
Glucose-Capillary: 103 mg/dL — ABNORMAL HIGH (ref 70–99)
Glucose-Capillary: 106 mg/dL — ABNORMAL HIGH (ref 70–99)
Glucose-Capillary: 101 mg/dL — ABNORMAL HIGH (ref 70–99)
Glucose-Capillary: 118 mg/dL — ABNORMAL HIGH (ref 70–99)

## 2018-10-13 NOTE — Plan of Care (Signed)
  Problem: RH Expression Communication Goal: LTG Patient will increase speech intelligibility (SLP) Description: LTG: Patient will increase speech intelligibility at word/phrase/conversation level with cues, % of the time (SLP) 10/13/2018 1041 by Venita Lick, [place], [place] (Taken 10/13/2018 1039) LTG: Patient will increase speech intelligibility (SLP): (upgraded back due to extend stay) -- Percent of time patient will use intelligible speech: 70% intelligibility Note: Upgraded back due to extended stay  10/13/2018 1037 by [place], [place], [place] (Taken 10/13/2018 1037) LTG: Patient will increase speech intelligibility (SLP): Supervision 10/13/2018 0751 by Charolett Bumpers, [place] (Taken 10/13/2018 [phone]) LTG: Patient will increase speech intelligibility (SLP): (downgraded due to reduced awareness and lack of progress) Moderate Assistance - Patient 50 - 74% Percent of time patient will use intelligible speech: 80% intelligibility Note: Downgraded due to reduced awareness and progress    Problem: RH Problem Solving Goal: LTG Patient will demonstrate problem solving for (SLP) Description: LTG:  Patient will demonstrate problem solving for basic/complex daily situations with cues  (SLP) 10/13/2018 1041 by Charolett Bumpers, [place] (Taken 10/13/2018 1039) LTG: Patient will demonstrate problem solving for (SLP): (upgraded back due to extend stay) -- Note: Upgraded back due to extended stay  10/13/2018 1037 by [place], [place], [place] (Taken 10/13/2018 1037) LTG: Patient will demonstrate problem solving for (SLP): (semi-complex) Other (comment) LTG Patient will demonstrate problem solving for: Supervision 10/13/2018 0751 by Charolett Bumpers, [place] (Taken 10/13/2018 [phone]) LTG: Patient will demonstrate problem solving for (SLP): Basic daily situations LTG Patient will demonstrate problem solving for: (downgraded difficulity level due to lack  of progress) -- Note: Downgraded difficulty level due to lack of progress

## 2018-10-13 NOTE — Progress Notes (Signed)
Physical Therapy Session Note  Patient Details  Name: Rachel Fox MRN: [id number] Date of Birth: 02-08-1946  Today's Date: 10/13/2018 PT Individual Time: 0800-0910 PT Individual Time Calculation (min): 70 min   Short Term Goals: Week 2:  PT Short Term Goal 1 (Week 2): Pt will perform bed mobility with min assist consistently PT Short Term Goal 2 (Week 2): Pt will ambulate 43f with mod assist and LRAD PT Short Term Goal 3 (Week 2): Pt will maintain standing balance x 2 minutes with min A from PT  Skilled Therapeutic Interventions/Progress Updates:   Pt received supine in bed and agreeable to PT. Supine>sit transfer with min assist at LLE and moderate cues for improved trunk control to achieve sitting EOB. PT assisted pt to don pants EOB, while maintaining sitting balance with supervision assist cues for improved anterior weight shift. stedy transfer to [facility] assist and min cues for midline orientation.   Pt transported to rehab gym. Squat pivot transfer to the L with min assist, but increased time and moderate cues for R weight shift to allow gluteal clearance of seat and table.   Sit>supine with min assist control of the LLE. Supine/sidelying NMR. Rolling R with max cues for LLE and improve awareness of control of trunk to perform shoulder protraction to initiate roll x 10. Pelvic rotation PNF stabilizing reversals x 8 bil with 6 sec hold. sidelying hip/knee flexion/extension x 15 with manual resistance. Supine>sit with min assist through log roll and increased time for safety and sequencing.   Sit<>stand from EOB with RW and R UE hand splint. Standing balance and pregait stepping x 5 Bil; mod-max assist from PT with noted buckling of the L knee in stance. Gait training with HW x 130fwith mod assist-max assist + 2 for WC follow. Max cues for weight shifting and terminal knee extension on the R to prevent buckling. Improved knee control with increased distance.   WC mobility with  hemi technique x 10021fith mod assist progressing to min assist form PT with moderate multimodal cues for improved sequencing of movement to prevent veer to the L. Pt returned to room and performed stedy transfer to bed with min assist. Sit>supine completed on the R with min assist for LLE control, and left supine in bed with call bell in reach and all needs met.       Therapy Documentation Precautions:  Precautions Precautions: Fall Precaution Comments: L side weak/flaccid, right gaze preference, L shoulder pain (risk for subluxation), pressure injury from laying on floor PTA Restrictions Weight Bearing Restrictions: No Vital Signs: Therapy Vitals Temp: 98.4 F (36.9 C) Temp Source: Oral Pulse Rate: 61 Resp: 15 BP: 110/67 Patient Position (if appropriate): Lying Oxygen Therapy SpO2: 99 % O2 Device: Room Air Pain: Burning, LUE, 3/10. RN aware  Therapy/Group: Individual Therapy  AusLorie Phenix29/2020, 9:10 AM

## 2018-10-13 NOTE — Progress Notes (Signed)
Speech Language Pathology Weekly Progress and Session Note  Patient Details  Name: Rachel Fox MRN: [id number] Date of Birth: Nov 04, 1945  Beginning of progress report period: October 05, 2018 End of progress report period: October 13, 2018  Today's Date: 10/13/2018 SLP Individual Time: 1355-1428, [phone] SLP Individual Time Calculation (min): 33 min and 43 min   Short Term Goals: Week 2: SLP Short Term Goal 1 (Week 2): Patient will consume Dys. 2 textures and thin liquids via cup with efficient mastication and complete oral clearance without overt s/s of aspiration with Supervision verbal cues for use of swallowing compensatory strategies. SLP Short Term Goal 1 - Progress (Week 2): Not met SLP Short Term Goal 2 (Week 2): Patient will demonstrate functional problem solving for basic and familiar tasks with Min A verbal cues. SLP Short Term Goal 2 - Progress (Week 2): Met SLP Short Term Goal 3 (Week 2): Patient will utilize memory compensatory strategies to recall new, daily information with Min A verbal cues. SLP Short Term Goal 3 - Progress (Week 2): Met SLP Short Term Goal 4 (Week 2): Patient will self-monitor and correct errors during functional tasks with Min verbal and visual cues. SLP Short Term Goal 4 - Progress (Week 2): Met SLP Short Term Goal 5 (Week 2): Patient will utilize speech intelligibility strategies at the sentence level with Mod A verbal cues to achieve ~80% intelligibility. SLP Short Term Goal 5 - Progress (Week 2): Met    New Short Term Goals: Week 3: SLP Short Term Goal 1 (Week 3): Patient will consume Dys. 2 textures and thin liquids via cup with efficient mastication and complete oral clearance without overt s/s of aspiration with Supervision verbal cues for use of swallowing compensatory strategies. SLP Short Term Goal 2 (Week 3): Patient will utilize speech intelligibility strategies at the sentence level with Min A verbal cues to achieve ~70% intelligibility. SLP  Short Term Goal 3 (Week 3): Patient will demonstrate problem solving in semi-complex tasks with Min A verbal cues. SLP Short Term Goal 4 (Week 3): Patient will self-monitor and correct errors during semi-complex problem solving tasks with Min verbal and visual cues. SLP Short Term Goal 5 (Week 3): Patient will utilize memory compensatory strategies to recall new, daily information with supervision A verbal cues.  Weekly Progress Updates: Pt met 4 out 5 goals this reporting period. Pt demonstrated progress in cognitive skills, demonstrating min-supervision A in basic problem solving, error awareness and recall during these tasks. Pt demonstrated improvement in basic money management counting change and subtracting change, beginning medication management and novel problem solving tasks. Pt continues to demonstrate reduced awareness of reduced vocal intensity impacting speech intelligibility, even following review of recorded structred and unstructred conversation for biofeedback no change in awareness. SLP downgraded LTG to semi-complex problem solving and speech intelligibility due to reduced awareness. Pt continues to require min A for antieror spillage and swallow strategies during PO consumption of dys 2 and noted increase throat clearing, questioning piecemeal swallowing d/t reduced bolus cohesion with dys 3 trials. Pt would benefit from skilled ST services in order to maximize functional independence and reduce burden of care, likely requiring 24 hour supervision and continue ST services.     Intensity: Minumum of 1-2 x/day, 30 to 90 minutes Frequency: 3 to 5 out of 7 days Duration/Length of Stay: 8/14 Treatment/Interventions: Cognitive remediation/compensation;Dysphagia/aspiration precaution training;Internal/external aids;Speech/Language facilitation;Therapeutic Activities;Environmental controls;Cueing hierarchy;Functional tasks;Patient/family education   Daily Session  Skilled Therapeutic  Interventions:  1# Skilled ST services  focused on cognitive skills. SLP facilitated recall of personal medication given list/aid on name/function/times per day with min A verbal cues and supervision A verbal cues for scanning to the left. SLP also facilitated semi-complex problem solving with medication management using ALFA, pt required min A vebral cues. Pt was left in room with call bell within reach and bed alarm set. ST recommends to continue skilled ST services.  2#   Skilled ST services focused on cognitive skills. SLP facilitated complex problem solving, error awareness and recall with aid skills utilizing personal medication management with 4 row pill box organizer, pt required max A verbal cues for error awareness and mod A verbal cues for scanning left of midline during task. Pt orginialy stated she would be able to complete medication on own, however after highlighting errors agreed assistance is required. Pt was left in room with call bell within reach and chair alarm set. ST recommends to continue skilled ST services.     General    Pain Pain Assessment Pain Scale: 0-10 Pain Score: 2   Therapy/Group: Individual Therapy  Tamiya Colello  [facility] 10/13/2018, 12:37 PM

## 2018-10-13 NOTE — Plan of Care (Signed)
  Problem: RH Expression Communication Goal: LTG Patient will increase speech intelligibility (SLP) Description: LTG: Patient will increase speech intelligibility at word/phrase/conversation level with cues, % of the time (SLP) Flowsheets (Taken 10/13/2018 0751) LTG: Patient will increase speech intelligibility (SLP): (downgraded due to reduced awareness and lack of progress) Moderate Assistance - Patient 50 - 74% Percent of time patient will use intelligible speech: 80% intelligibility Note: Downgraded due to reduced awareness and progress    Problem: RH Problem Solving Goal: LTG Patient will demonstrate problem solving for (SLP) Description: LTG:  Patient will demonstrate problem solving for basic/complex daily situations with cues  (SLP) Flowsheets (Taken 10/13/2018 0751) LTG: Patient will demonstrate problem solving for (SLP): Basic daily situations LTG Patient will demonstrate problem solving for: (downgraded difficulity level due to lack of progress) -- Note: Downgraded difficulty level due to lack of progress

## 2018-10-13 NOTE — Progress Notes (Signed)
[place] PHYSICAL MEDICINE & REHABILITATION PROGRESS NOTE   Subjective/Complaints:   Discussed wound care with nsg   Review of systems difficult to obtained due to cognition poor awareness of deficits.   Objective:   Dg Knee 1-2 Views Left  Result Date: 10/11/2018 CLINICAL DATA:  Left knee pain EXAM: LEFT KNEE - 1-2 VIEW COMPARISON:  None. FINDINGS: No fracture or malalignment. Mild patellofemoral and medial joint space degenerative change. Faint joint space calcification. No significant knee effusion. IMPRESSION: 1. No acute osseous abnormality. 2. Mild degenerative changes.  Chondrocalcinosis. Electronically Signed   By: Donavan Foil M.D.   On: 10/11/2018 19:29   No results for input(s): WBC, HGB, HCT, PLT in the last 72 hours. No results for input(s): NA, K, CL, CO2, GLUCOSE, BUN, CREATININE, CALCIUM in the last 72 hours.  Intake/Output Summary (Last 24 hours) at 10/13/2018 0748 Last data filed at 10/13/2018 0651 Gross per 24 hour  Intake 440 ml  Output 300 ml  Net 140 ml     Physical Exam: Vital Signs Blood pressure 110/67, pulse 61, temperature 98.4 F (36.9 C), temperature source Oral, resp. rate 15, height 5' 3" (1.6 m), weight 72 kg, SpO2 99 %.   General: No acute distress Mood and affect are appropriate Heart: Regular rate and rhythm no rubs murmurs or extra sounds Lungs: Clear to auscultation, breathing unlabored, no rales or wheezes Abdomen: Positive bowel sounds, soft nontender to palpation, nondistended Extremities: No clubbing, cyanosis, or edema Skin: Dressing on left buttocks decubitus   Left neglect noted  Neurologic: Cranial nerves II through XII intact, motor strength is 5/5 in right deltoid, bicep, tricep, grip, hip flexor, knee extensors, ankle dorsiflexor and plantar flexor 0/5 left upper extremity deltoid bicep tricep grip Trace hip knee extensor synergy in the left lower extremity and aduction  Sensation absent light touch in the left upper  extremity Intact light touch left lower extremity  Cerebellar exam unable to perform on left side due to weakness musculoskeletal: Full range of motion in all 4 extremities. No joint swelling  7/13  7/29    Assessment/Plan: 1. Functional deficits secondary to right MCA infarct which require 3+ hours per day of interdisciplinary therapy in a comprehensive inpatient rehab setting.  Physiatrist is providing close team supervision and 24 hour management of active medical problems listed below.  Physiatrist and rehab team continue to assess barriers to discharge/monitor patient progress toward functional and medical goals  Care Tool:  Bathing    Body parts bathed by patient: Left arm, Abdomen, Chest, Front perineal area, Left upper leg, Right upper leg, Face   Body parts bathed by helper: Right arm, Buttocks, Right lower leg, Left lower leg     Bathing assist Assist Level: Moderate Assistance - Patient 50 - 74%     Upper Body Dressing/Undressing Upper body dressing   What is the patient wearing?: Pull over shirt    Upper body assist Assist Level: Moderate Assistance - Patient 50 - 74%    Lower Body Dressing/Undressing Lower body dressing      What is the patient wearing?: Pants     Lower body assist Assist for lower body dressing: Maximal Assistance - Patient 25 - 49%     Toileting Toileting    Toileting assist Assist for toileting: Total Assistance - Patient < 25%     Transfers Chair/bed transfer  Transfers assist  Chair/bed transfer activity did not occur: Safety/medical concerns  Chair/bed transfer assist level: Maximal Assistance - Patient 25 -  49%     Locomotion Ambulation   Ambulation assist   Ambulation activity did not occur: Safety/medical concerns  Assist level: 2 helpers Assistive device: Walker-rolling Max distance: 10   Walk 10 feet activity   Assist  Walk 10 feet activity did not occur: Safety/medical concerns  Assist level: 2  helpers Assistive device: Walker-rolling   Walk 50 feet activity   Assist Walk 50 feet with 2 turns activity did not occur: Safety/medical concerns         Walk 150 feet activity   Assist Walk 150 feet activity did not occur: Safety/medical concerns         Walk 10 feet on uneven surface  activity   Assist Walk 10 feet on uneven surfaces activity did not occur: Safety/medical concerns         Wheelchair     Assist Will patient use wheelchair at discharge?: Yes Type of Wheelchair: Manual    Wheelchair assist level: Minimal Assistance - Patient > 75% Max wheelchair distance: 50 ft    Wheelchair 50 feet with 2 turns activity    Assist        Assist Level: Minimal Assistance - Patient > 75%   Wheelchair 150 feet activity     Assist Wheelchair 150 feet activity did not occur: Safety/medical concerns        Medical Problem List and Plan: 1.Lefthemiparesis, dysphagia, and visual-spatial deficitssecondary to right MCA infarction Team conference today please see physician documentation under team conference tab, met with team face-to-face to discuss problems,progress, and goals. Formulized individual treatment plan based on medical history, underlying problem and comorbidities. 2. Antithrombotics: -DVT/anticoagulation:Eliquison board -antiplatelet therapy: N/A 3. Pain Management:Tylenol as needed- added topamax for HA with some benefit increased to BID- improving    -knee pain left knee DJD with immobility- voltaren gel  -maintain proper shoulder position and support when in bed and chair. Discussed with patient again today 4. Mood:Provide emotional support -antipsychotic agents: N/A 5. Neuropsych: This patientiscapable of making decisions on herown behalf. 6. Skin/Wound Care:Routine skin checks. Aquacel, gen surg evaled there is no evidence of infection but some necrotic tissue  noted.  May benefit from hydro therapy 7. Fluids/Electrolytes/Nutrition:Routine in and outs with follow-up chemistrieson admit, low albumin 2.6 monitor encourage p.o. intake, using  pro-stat 8.Atrial fibrillation. Amiodarone 200 mg twice daily, Lanoxin 0.125 mg daily, Cardizem 90 mg 4 times daily. Cardiac rate controlled. Follow cardiology services Vitals:   10/12/18 1935 10/13/18 0553  BP: 117/70 110/67  Pulse: (!) 53 61  Resp: 17 15  Temp: 98.6 F (37 C) 98.4 F (36.9 C)  SpO2: 99% 99%  HR controlled 7/29, blood pressure is normal  9. Dysphagia. Dysphagia #2, thin liquids -encourage fluids, no signs of PNA  10. Diabetes mellitus. Hemoglobin A1c 6.1. SSI. Patient on Glucophage 500 mg twice daily prior to admission. Resume as indicated 11.Rhabdomyolysis. no sign of renal failure  12. Hyperlipidemia. Lipitor 13. Constipation. Laxative assistance  14.  Leukocytosis not febrile, ua equivocal, ucx multispecies  11.4K improving LOS: 16 days A FACE TO FACE EVALUATION WAS PERFORMED  Charlett Blake 10/13/2018, 7:48 AM

## 2018-10-13 NOTE — Progress Notes (Signed)
Occupational Therapy Weekly Progress Note  Patient Details  Name: Rachel Fox MRN: [id number] Date of Birth: 11/04/1945  Beginning of progress report period: October 06, 2018 End of progress report period: October 13, 2018  Today's Date: 10/13/2018 OT Individual Time: 1300-1355 OT Individual Time Calculation (min): 60 min    Patient has met 1 of 5 short term goals.  Pt making slow progress towards occupational therapy goals. Pt with syncopal/hypotensive episode over the weekend and pt with residual functional deficits. Pt requiring mod - max cuing for L visual scanning. Pt continues to report increase in pain in L shoulder as well. L UE remains flaccid. Pt needing max A for self care tasks and use of stedy for safety with toilet transfer and LB clothing management.   Patient continues to demonstrate the following deficits: muscle weakness, decreased cardiorespiratoy endurance, decreased coordination and decreased motor planning, L inattention, decreased initiation, decreased attention, decreased awareness, decreased problem solving, decreased safety awareness, decreased memory and delayed processing and decreased sitting balance, decreased standing balance, decreased postural control, hemiplegia and decreased balance strategies and therefore will continue to benefit from skilled OT intervention to enhance overall performance with BADL, iADL and Reduce care partner burden.  Patient progressing toward long term goals..  Continue plan of care.  OT Short Term Goals Week 3:  OT Short Term Goal 1 (Week 3): Pt will perform toilet transfer with mod A overall OT Short Term Goal 2 (Week 3): Pt will maintain dynamic standing balance with mod A for LB clothing management. OT Short Term Goal 3 (Week 3): Pt will visually scan to the L for self care items with min cuing.  Skilled Therapeutic Interventions/Progress Updates:    Upon entering the room, pt seated in wheelchair with no c/o pain but reports  feeling very fatigued. OT assisted pt via wheelchair to day room and hi low table. Pt standing x 2 at table to L UE into weight bearing position for 3 minutes and 4 minutes respectively. Pt needing mod lifting assistance to stand with L knee blocked. Pt also participating in table slides with R UE providing assistance but pt needing cuing to attend to L UE during task. Pt engaged in seated dynavision task to address visual scanning. Pt needing mod - max manual facilitation to assist pt back to midline after hitting targets on L side of board. Pt returning to room at end of session with chair alarm activated and hand off to SLP.   Therapy Documentation Precautions:  Precautions Precautions: Fall Precaution Comments: L side weak/flaccid, right gaze preference, L shoulder pain (risk for subluxation), pressure injury from laying on floor PTA Restrictions Weight Bearing Restrictions: No General:   Vital Signs: Therapy Vitals Temp: 97.9 F (36.6 C) Pulse Rate: 68 Resp: 18 BP: 122/64 Patient Position (if appropriate): Sitting Oxygen Therapy SpO2: 100 % O2 Device: Room Air Pain:    Therapy/Group: Individual Therapy  ,  P 10/13/2018, 5:03 PM   

## 2018-10-14 ENCOUNTER — Inpatient Hospital Stay (HOSPITAL_COMMUNITY): Payer: Medicare Other | Admitting: Physical Therapy

## 2018-10-14 ENCOUNTER — Inpatient Hospital Stay (HOSPITAL_COMMUNITY): Payer: Medicare Other | Admitting: Speech Pathology

## 2018-10-14 ENCOUNTER — Inpatient Hospital Stay (HOSPITAL_COMMUNITY): Payer: Medicare Other | Admitting: Occupational Therapy

## 2018-10-14 LAB — GLUCOSE, CAPILLARY
Glucose-Capillary: 112 mg/dL — ABNORMAL HIGH (ref 70–99)
Glucose-Capillary: 119 mg/dL — ABNORMAL HIGH (ref 70–99)
Glucose-Capillary: 109 mg/dL — ABNORMAL HIGH (ref 70–99)
Glucose-Capillary: 127 mg/dL — ABNORMAL HIGH (ref 70–99)

## 2018-10-14 NOTE — Progress Notes (Signed)
Physical Therapy Weekly Progress Note  Patient Details  Name: Rachel Fox MRN: [id number] Date of Birth: 05-May-1945  Beginning of progress report period: October 07, 2018 End of progress report period: October 14, 2018  Today's Date: 10/14/2018 PT Individual Time: 1020-1118 PT Individual Time Calculation (min): 58 min   Patient has met 0 of 3 short term goals. Pt had syncopal episode with CVA like appearance on 7/24, with residual increased weakness on the LLE. Pt now requires mod-max assist for stand and squat pivot transfers, mod-max assist bed mobility for trunk control and midline orientation, max assist ambulation with HW to prevent L knee collapse. Pt as mild improvement in midline orientation and intellectual awareness of deficits, but poor emergent awareness to deficits.   Patient continues to demonstrate the following deficits muscle weakness, muscle joint tightness and muscle paralysis, decreased cardiorespiratoy endurance, impaired timing and sequencing, abnormal tone, unbalanced muscle activation, motor apraxia, decreased coordination and decreased motor planning, decreased visual perceptual skills, decreased attention to left and decreased motor planning, decreased attention, decreased awareness, decreased problem solving, decreased safety awareness, decreased memory and delayed processing and decreased sitting balance, decreased standing balance, decreased postural control, hemiplegia and decreased balance strategies and therefore will continue to benefit from skilled PT intervention to increase functional independence with mobility.  Patient not progressing toward long term goals.  See goal revision.  Continue plan of care.  PT Short Term Goals Week 2:  PT Short Term Goal 1 (Week 2): Pt will perform bed mobility with min assist consistently PT Short Term Goal 1 - Progress (Week 2): Not met PT Short Term Goal 2 (Week 2): Pt will ambulate 30f with mod assist and LRAD PT Short Term  Goal 2 - Progress (Week 2): Not met PT Short Term Goal 3 (Week 2): Pt will maintain standing balance x 2 minutes with min A from PT PT Short Term Goal 3 - Progress (Week 2): Not met Week 3:  PT Short Term Goal 1 (Week 3): Pt will perform bed mobility with min assist PT Short Term Goal 2 (Week 3): Pt will ambulate 331fwith mod assist and LRAD PT Short Term Goal 3 (Week 3): Pt will propell WC [id number] hemi technique and CGA PT Short Term Goal 4 (Week 3): Pt will transfer to and from bed with mod assist consistently Week 4:     Skilled Therapeutic Interventions/Progress Updates:   [facility]  Toilet transfer in stedy and min assist form PT. PT performed clothing management, while pt maintain UE support on stedy. [facility].  Squat pivot transfers to and from nustep with max assist to the R due to poor WB through LLE and poor midline orientation. Nustep reciprocal movement and postural control training x 6 min with moderate cues for midline orientation, increased speed, improved symmetry of movement, and full extension on the L.   Gait training with HW x [id number] max assist from PT to perform R weight shift and to maintain terminal knee extension on the LLE. Stand [facility] to prevent knee buckling on the L and R UE on HW.   Pt returned to room and performed stand pivot transfer to bed with HW and max assist for safety and improved gait pattern. Sit>supine completed with min assist for LLE control, andleft supine in bed with call bell in reach and all needs met.  Therapy Documentation Precautions:  Precautions Precautions: Fall Precaution Comments: L side weak/flaccid, right gaze preference, L shoulder pain (risk for subluxation), pressure injury from laying on floor PTA Restrictions Weight Bearing Restrictions: No Pain: Pain Assessment Pain Scale: 0-10 Pain Score: 2   Pain Location: Shoulder Pain Orientation: Left Pain Intervention(s): Repositioned   Therapy/Group: Individual Therapy  [name] 10/14/2018, 12:09 PM

## 2018-10-14 NOTE — Progress Notes (Signed)
Occupational Therapy Session Note  Patient Details  Name: Rachel Fox MRN: [id number] Date of Birth: 12/20/1945  Today's Date: 10/14/2018 OT Individual Time: [phone] OT Individual Time Calculation (min): 87 min    Short Term Goals: Week 3:  OT Short Term Goal 1 (Week 3): Pt will perform toilet transfer with mod A overall OT Short Term Goal 2 (Week 3): Pt will maintain dynamic standing balance with mod A for LB clothing management. OT Short Term Goal 3 (Week 3): Pt will visually scan to the L for self care items with min cuing.  Skilled Therapeutic Interventions/Progress Updates:    Patient in bed, alert and ready for therapy session.  Supine to SSP with mod A, sit to stand for stedy transfer with min a - moderate lean to the left requiring min A and cues to correct.  Buttocks wound covered.  Shower completed seated on transfer bench with CS for balance, bathing mod A.  stedy transfer back to bed, nursing changed wound dressing.  LB dressing completed bed level with max a, dependent for teds and slipper socks.  Rolling in bed with min a right, CG A to left.  UB dressing completed seated at edge of bed with mod A.  stedy transfer with min a to w/c.  Supervision after set up provided for breakfast.  Patient remained in w/c at close of session with seat belt alarm on, call bell in reach and NT supervising completion of am meal.    Therapy Documentation Precautions:  Precautions Precautions: Fall Precaution Comments: L side weak/flaccid, right gaze preference, L shoulder pain (risk for subluxation), pressure injury from laying on floor PTA Restrictions Weight Bearing Restrictions: No General:   Vital Signs:   Pain: Pain Assessment Pain Scale: 0-10 Pain Score: 2  Pain Location: Shoulder Pain Orientation: Left Pain Intervention(s): Repositioned Other Treatments:     Therapy/Group: Individual Therapy  Carlos Levering 10/14/2018, 12:04 PM

## 2018-10-14 NOTE — Progress Notes (Signed)
[facility] PHYSICAL MEDICINE & REHABILITATION PROGRESS NOTE   Subjective/Complaints:   No issues overnite , intermittent Left hand wrist an dforearm burning pain    Review of systems Neg CP,SOB, N/V/D Objective:   No results found. No results for input(s): WBC, HGB, HCT, PLT in the last 72 hours. No results for input(s): NA, K, CL, CO2, GLUCOSE, BUN, CREATININE, CALCIUM in the last 72 hours.  Intake/Output Summary (Last 24 hours) at 10/14/2018 0727 Last data filed at 10/14/2018 0701 Gross per 24 hour  Intake 580 ml  Output 700 ml  Net -120 ml     Physical Exam: Vital Signs Blood pressure 132/77, pulse 67, temperature 98.4 F (36.9 C), temperature source Oral, resp. rate 18, height 5\' 3"  (1.6 m), weight 72 kg, SpO2 97 %.   General: No acute distress Mood and affect are appropriate Heart: Regular rate and rhythm no rubs murmurs or extra sounds Lungs: Clear to auscultation, breathing unlabored, no rales or wheezes Abdomen: Positive bowel sounds, soft nontender to palpation, nondistended Extremities: No clubbing, cyanosis, or edema Skin: Dressing on left buttocks decubitus   Left neglect noted  Neurologic: Cranial nerves II through XII intact, motor strength is 5/5 in right deltoid, bicep, tricep, grip, hip flexor, knee extensors, ankle dorsiflexor and plantar flexor 0/5 left upper extremity deltoid bicep tricep grip Trace hip knee extensor synergy in the left lower extremity and aduction  Sensation absent light touch in the left upper extremity Intact light touch left lower extremity  Cerebellar exam unable to perform on left side due to weakness musculoskeletal: No pain with Left hand or wrist ROM, no erythema, no jt effusion  7/13  7/29    Assessment/Plan: 1. Functional deficits secondary to right MCA infarct which require 3+ hours per day of interdisciplinary therapy in a comprehensive inpatient rehab setting.  Physiatrist is providing close team supervision and  24 hour management of active medical problems listed below.  Physiatrist and rehab team continue to assess barriers to discharge/monitor patient progress toward functional and medical goals  Care Tool:  Bathing    Body parts bathed by patient: Left arm, Abdomen, Chest, Front perineal area, Left upper leg, Right upper leg, Face   Body parts bathed by helper: Right arm, Buttocks, Right lower leg, Left lower leg     Bathing assist Assist Level: Moderate Assistance - Patient 50 - 74%     Upper Body Dressing/Undressing Upper body dressing   What is the patient wearing?: Pull over shirt    Upper body assist Assist Level: Moderate Assistance - Patient 50 - 74%    Lower Body Dressing/Undressing Lower body dressing      What is the patient wearing?: Pants     Lower body assist Assist for lower body dressing: Maximal Assistance - Patient 25 - 49%     Toileting Toileting    Toileting assist Assist for toileting: Total Assistance - Patient < 25%     Transfers Chair/bed transfer  Transfers assist  Chair/bed transfer activity did not occur: Safety/medical concerns  Chair/bed transfer assist level: Minimal Assistance - Patient > 75%     Locomotion Ambulation   Ambulation assist   Ambulation activity did not occur: Safety/medical concerns  Assist level: 2 helpers Assistive device: Walker-hemi Max distance: 15   Walk 10 feet activity   Assist  Walk 10 feet activity did not occur: Safety/medical concerns  Assist level: 2 helpers Assistive device: Walker-hemi   Walk 50 feet activity   Assist Walk 50  feet with 2 turns activity did not occur: Safety/medical concerns         Walk 150 feet activity   Assist Walk 150 feet activity did not occur: Safety/medical concerns         Walk 10 feet on uneven surface  activity   Assist Walk 10 feet on uneven surfaces activity did not occur: Safety/medical concerns         Wheelchair     Assist Will  patient use wheelchair at discharge?: Yes Type of Wheelchair: Manual    Wheelchair assist level: Minimal Assistance - Patient > 75% Max wheelchair distance: 100    Wheelchair 50 feet with 2 turns activity    Assist        Assist Level: Minimal Assistance - Patient > 75%   Wheelchair 150 feet activity     Assist Wheelchair 150 feet activity did not occur: Safety/medical concerns        Medical Problem List and Plan: 1.Lefthemiparesis, dysphagia, and visual-spatial deficitssecondary to right MCA infarction CIR PT, OT,  SLP 2. Antithrombotics: -DVT/anticoagulation:Eliquison board -antiplatelet therapy: N/A 3. Pain Management:Tylenol as needed- added topamax for HA with some benefit increased to BID- improving    -knee pain left knee DJD with immobility- voltaren gel  -maintain proper shoulder position and support when in bed and chair. Discussed with patient again today 4. Mood:Provide emotional support -antipsychotic agents: N/A 5. Neuropsych: This patientiscapable of making decisions on herown behalf. 6. Skin/Wound Care:Routine skin checks. Aquacel, gen surg evaled there is no evidence of infection but some necrotic tissue noted.  May benefit from hydro therapy 7. Fluids/Electrolytes/Nutrition:Routine in and outs with follow-up chemistrieson admit, low albumin 2.6 monitor encourage p.o. intake, using  pro-stat 8.Atrial fibrillation. Amiodarone 200 mg twice daily, Lanoxin 0.125 mg daily, Cardizem 90 mg 4 times daily. Cardiac rate controlled. Follow cardiology services Vitals:   10/13/18 1929 10/14/18 0348  BP: (!) 115/56 132/77  Pulse: 64 67  Resp: 18 18  Temp: 97.7 F (36.5 C) 98.4 F (36.9 C)  SpO2: 98% 97%  HR and BP controlled 7/30,  9. Dysphagia. Dysphagia #2, thin liquids -encourage fluids, no signs of PNA  10. Diabetes mellitus. Hemoglobin A1c 6.1. SSI.  Patient on Glucophage 500 mg twice daily prior to admission. Resume as indicated 11.Rhabdomyolysis. no sign of renal failure  12. Hyperlipidemia. Lipitor 13. Constipation. Laxative assistance  14.  Leukocytosis not febrile, ua equivocal, ucx multispecies  11.4K improving LOS: 17 days A FACE TO FACE EVALUATION WAS PERFORMED  Rachel Fox 10/14/2018, 7:27 AM

## 2018-10-14 NOTE — Progress Notes (Signed)
Speech Language Pathology Daily Session Note  Patient Details  Name: Rachel Fox MRN: [id number] Date of Birth: May 14, 1945  Today's Date: 10/14/2018 SLP Individual Time: 1345-1430 SLP Individual Time Calculation (min): 45 min  Short Term Goals: Week 3: SLP Short Term Goal 1 (Week 3): Patient will consume Dys. 2 textures and thin liquids via cup with efficient mastication and complete oral clearance without overt s/s of aspiration with Supervision verbal cues for use of swallowing compensatory strategies. SLP Short Term Goal 2 (Week 3): Patient will utilize speech intelligibility strategies at the sentence level with Min A verbal cues to achieve ~70% intelligibility. SLP Short Term Goal 3 (Week 3): Patient will demonstrate problem solving in semi-complex tasks with Min A verbal cues. SLP Short Term Goal 4 (Week 3): Patient will self-monitor and correct errors during semi-complex problem solving tasks with Min verbal and visual cues. SLP Short Term Goal 5 (Week 3): Patient will utilize memory compensatory strategies to recall new, daily information with supervision A verbal cues.  Skilled Therapeutic Interventions:    Skilled treatment session focused on cognition goals. SLP facilitated session by providing money management task. Pt required specific cues to scan to midline to locate paper money. With Mod A cues for visual scanning, pt able to count money with Min A cues for counting. During session, pt perseverative on going to bathroom. SLP facilitated transfer with [facility] but pt unable to void. Pt transferred back to bed and left with all needs within reach. Continue per current plan of care.   Pain Pain Assessment Pain Scale: 0-10 Pain Score: 2  Pain Location: Shoulder Pain Orientation: Left Pain Intervention(s): Repositioned  Therapy/Group: Individual Therapy  Brendolyn Stockley 10/14/2018, 3:00 PM

## 2018-10-15 ENCOUNTER — Inpatient Hospital Stay (HOSPITAL_COMMUNITY): Payer: Medicare Other | Admitting: Physical Therapy

## 2018-10-15 ENCOUNTER — Inpatient Hospital Stay (HOSPITAL_COMMUNITY): Payer: Medicare Other | Admitting: Occupational Therapy

## 2018-10-15 ENCOUNTER — Inpatient Hospital Stay (HOSPITAL_COMMUNITY): Payer: Medicare Other | Admitting: Speech Pathology

## 2018-10-15 LAB — GLUCOSE, CAPILLARY
Glucose-Capillary: 129 mg/dL — ABNORMAL HIGH (ref 70–99)
Glucose-Capillary: 112 mg/dL — ABNORMAL HIGH (ref 70–99)
Glucose-Capillary: 101 mg/dL — ABNORMAL HIGH (ref 70–99)
Glucose-Capillary: 100 mg/dL — ABNORMAL HIGH (ref 70–99)

## 2018-10-15 NOTE — Progress Notes (Signed)
Speech Language Pathology Daily Session Note  Patient Details  Name: Rachel Fox MRN: [id number] Date of Birth: 1946/01/14  Today's Date: 10/15/2018 SLP Individual Time: 6168-3729 SLP Individual Time Calculation (min): 30 min  Short Term Goals: Week 3: SLP Short Term Goal 1 (Week 3): Patient will consume Dys. 2 textures and thin liquids via cup with efficient mastication and complete oral clearance without overt s/s of aspiration with Supervision verbal cues for use of swallowing compensatory strategies. SLP Short Term Goal 2 (Week 3): Patient will utilize speech intelligibility strategies at the sentence level with Min A verbal cues to achieve ~70% intelligibility. SLP Short Term Goal 3 (Week 3): Patient will demonstrate problem solving in semi-complex tasks with Min A verbal cues. SLP Short Term Goal 4 (Week 3): Patient will self-monitor and correct errors during semi-complex problem solving tasks with Min verbal and visual cues. SLP Short Term Goal 5 (Week 3): Patient will utilize memory compensatory strategies to recall new, daily information with supervision A verbal cues.  Skilled Therapeutic Interventions:  Skilled treatment session focused on cognition goals. SLP received pt in bed following OT session. Pt fatigued but willing to participate in therapy. Pt requested some water, SLP retrieved some ice water for pt. SLP further facilitated session by providing visual scanning task to target symbol cancellation. Pt able to state that she needed to scan to her left and was able to perform in 6 out of 8 opportunities. Pt began to become sleepy at end of session. Pt left in bed, bed alarm on and all needs within reach. Continue per current plan of care.      Pain    Therapy/Group: Individual Therapy  Hawa Henly 10/15/2018, 2:49 PM

## 2018-10-15 NOTE — Progress Notes (Signed)
[facility] PHYSICAL MEDICINE & REHABILITATION PROGRESS NOTE   Subjective/Complaints:    No new issues overnite, slept ok  Review of systems Neg CP,SOB, N/V/D Objective:   No results found. No results for input(s): WBC, HGB, HCT, PLT in the last 72 hours. No results for input(s): NA, K, CL, CO2, GLUCOSE, BUN, CREATININE, CALCIUM in the last 72 hours.  Intake/Output Summary (Last 24 hours) at 10/15/2018 0713 Last data filed at 10/15/2018 0300 Gross per 24 hour  Intake 580 ml  Output 350 ml  Net 230 ml     Physical Exam: Vital Signs Blood pressure 122/72, pulse (!) 51, temperature 97.6 F (36.4 C), resp. rate 18, height 5\' 3"  (1.6 m), weight 72 kg, SpO2 99 %.   General: No acute distress Mood and affect are appropriate Heart: Regular rate and rhythm no rubs murmurs or extra sounds Lungs: Clear to auscultation, breathing unlabored, no rales or wheezes Abdomen: Positive bowel sounds, soft nontender to palpation, nondistended Extremities: No clubbing, cyanosis, or edema Skin: Dressing on left buttocks decubitus   Left neglect noted  Neurologic: Cranial nerves II through XII intact, motor strength is 5/5 in right deltoid, bicep, tricep, grip, hip flexor, knee extensors, ankle dorsiflexor and plantar flexor 0/5 left upper extremity deltoid bicep tricep grip Trace hip knee extensor synergy in the left lower extremity and aduction  Sensation absent light touch in the left upper extremity Intact light touch left lower extremity  Cerebellar exam unable to perform on left side due to weakness musculoskeletal: No pain with Left hand or wrist ROM, no erythema, no jt effusion  7/13  7/29    Assessment/Plan: 1. Functional deficits secondary to right MCA infarct which require 3+ hours per day of interdisciplinary therapy in a comprehensive inpatient rehab setting.  Physiatrist is providing close team supervision and 24 hour management of active medical problems listed below.   Physiatrist and rehab team continue to assess barriers to discharge/monitor patient progress toward functional and medical goals  Care Tool:  Bathing    Body parts bathed by patient: Left arm, Abdomen, Chest, Front perineal area, Left upper leg, Right upper leg, Face   Body parts bathed by helper: Right arm, Buttocks, Right lower leg, Left lower leg     Bathing assist Assist Level: Moderate Assistance - Patient 50 - 74%     Upper Body Dressing/Undressing Upper body dressing   What is the patient wearing?: Pull over shirt    Upper body assist Assist Level: Moderate Assistance - Patient 50 - 74%    Lower Body Dressing/Undressing Lower body dressing      What is the patient wearing?: Incontinence brief, Pants     Lower body assist Assist for lower body dressing: Maximal Assistance - Patient 25 - 49%     Toileting Toileting    Toileting assist Assist for toileting: Total Assistance - Patient < 25%     Transfers Chair/bed transfer  Transfers assist  Chair/bed transfer activity did not occur: Safety/medical concerns  Chair/bed transfer assist level: Minimal Assistance - Patient > 75%     Locomotion Ambulation   Ambulation assist   Ambulation activity did not occur: Safety/medical concerns  Assist level: 2 helpers Assistive device: Walker-hemi Max distance: 15   Walk 10 feet activity   Assist  Walk 10 feet activity did not occur: Safety/medical concerns  Assist level: 2 helpers Assistive device: Walker-hemi   Walk 50 feet activity   Assist Walk 50 feet with 2 turns activity did not  occur: Safety/medical concerns         Walk 150 feet activity   Assist Walk 150 feet activity did not occur: Safety/medical concerns         Walk 10 feet on uneven surface  activity   Assist Walk 10 feet on uneven surfaces activity did not occur: Safety/medical concerns         Wheelchair     Assist Will patient use wheelchair at discharge?: Yes  Type of Wheelchair: Manual    Wheelchair assist level: Minimal Assistance - Patient > 75% Max wheelchair distance: 100    Wheelchair 50 feet with 2 turns activity    Assist        Assist Level: Minimal Assistance - Patient > 75%   Wheelchair 150 feet activity     Assist Wheelchair 150 feet activity did not occur: Safety/medical concerns        Medical Problem List and Plan: 1.Lefthemiparesis, dysphagia, and visual-spatial deficitssecondary to right MCA infarction CIR PT, OT,  SLP 2. Antithrombotics: -DVT/anticoagulation:Eliquison board -antiplatelet therapy: N/A 3. Pain Management:Tylenol as needed- added topamax for HA with some benefit increased to BID- improving    -knee pain left knee DJD with immobility- voltaren gel  -maintain proper shoulder position and support when in bed and chair. Discussed with patient again today 4. Mood:Provide emotional support -antipsychotic agents: N/A 5. Neuropsych: This patientiscapable of making decisions on herown behalf. 6. Skin/Wound Care:Routine skin checks. Aquacel, gen surg evaled there is no evidence of infection but some necrotic tissue noted.  May benefit from hydro therapy 7. Fluids/Electrolytes/Nutrition:Routine in and outs with follow-up chemistrieson admit, low albumin 2.6 monitor encourage p.o. intake, using  pro-stat 8.Atrial fibrillation. Amiodarone 200 mg twice daily, Lanoxin 0.125 mg daily, Cardizem 90 mg 4 times daily. Cardiac rate controlled. Follow cardiology services Vitals:   10/14/18 1932 10/15/18 0551  BP: 121/63 122/72  Pulse: 79 (!) 51  Resp: 19 18  Temp: 97.7 F (36.5 C) 97.6 F (36.4 C)  SpO2: 99% 99%  HR and BP controlled 7/31,HR  Low this am will monitor   9. Dysphagia. Dysphagia #2, thin liquids no signs of aspiration  -encourage fluids, no signs of PNA  10. Diabetes mellitus. Hemoglobin A1c  6.1. SSI. Patient on Glucophage 500 mg twice daily prior to admission. Resume as indicated 11.Rhabdomyolysis. no sign of renal failure  12. Hyperlipidemia. Lipitor 13. Constipation. Laxative assistance  14.  Leukocytosis not febrile, ua equivocal, ucx multispecies  11.4K improving LOS: 18 days A FACE TO FACE EVALUATION WAS PERFORMED  Charlett Blake 10/15/2018, 7:13 AM

## 2018-10-15 NOTE — Progress Notes (Signed)
Occupational Therapy Session Note  Patient Details  Name: Rachel Fox MRN: [id number] Date of Birth: [birth year]  Today's Date: 10/15/2018 OT Individual Time: 9509-3267 OT Individual Time Calculation (min): 81 min    Short Term Goals: Week 3:  OT Short Term Goal 1 (Week 3): Pt will perform toilet transfer with mod A overall OT Short Term Goal 2 (Week 3): Pt will maintain dynamic standing balance with mod A for LB clothing management. OT Short Term Goal 3 (Week 3): Pt will visually scan to the L for self care items with min cuing.  Skilled Therapeutic Interventions/Progress Updates:    patient supine in bed, alert and ready for therapy session.  Supine to SSP with mod A.  Sit pivot transfers to/from bed, w/c, mat table mod A both right and left, cues for weight shift.  Unsupported sitting on mat table for 40 minutes of session with focus on trunk mobility, left UE positioning, proximal mobility, inhibitory techniques for pecs/IR, weight bearing and reaching tasks.  Noted proximal motor control under some circumstances but difficulty with repeating.  Good midline orientation in SSP.  Completed standing for 3 minutes with focus on midline orientation and weight bearing through left UE/LE - min/mod A to maintain with facilitation at knee.  Seated word search task with puzzle placed on left side with good attention to task and ability to locate difficult words within puzzle, matching task on left side completed independently.  Patient returned to bed at close of session in side lying position with bed alarm set and call bell in reach.    Therapy Documentation Precautions:  Precautions Precautions: Fall Precaution Comments: L side weak/flaccid, right gaze preference, L shoulder pain (risk for subluxation), pressure injury from laying on floor PTA Restrictions Weight Bearing Restrictions: No General:   Vital Signs: Therapy Vitals Temp: 98.1 F (36.7 C) Pulse Rate: (!) 56 Resp: 18 BP:  111/65 Patient Position (if appropriate): Lying Oxygen Therapy SpO2: 99 % O2 Device: Room Air Pain: Pain Assessment Pain Scale: 0-10 Pain Score: 1  Other Treatments:     Therapy/Group: Individual Therapy  Carlos Levering 10/15/2018, 3:38 PM

## 2018-10-15 NOTE — Progress Notes (Signed)
Physical Therapy Session Note  Patient Details  Name: Rachel Fox MRN: [id number] Date of Birth: 1945/12/30  Today's Date: 10/15/2018 PT Individual Time: 0810-0920 AND 1032-1100 PT Individual Time Calculation (min): 70 min and 28 min   Short Term Goals: Week 3:  PT Short Term Goal 1 (Week 3): Pt will perform bed mobility with min assist PT Short Term Goal 2 (Week 3): Pt will ambulate 61f with mod assist and LRAD PT Short Term Goal 3 (Week 3): Pt will propell WC 1053fwith hemi technique and CGA PT Short Term Goal 4 (Week 3): Pt will transfer to and from bed with mod assist consistently  Skilled Therapeutic Interventions/Progress Updates:  Session 1  Pt received supine in bed and agreeable to PT. Supine>sit transfer with mod assist and to trunk to sit on the L side of bed. PT assisted pt to don pants and shirt with mod-max assist for clothing management and supervision assist for sitting balance at Eob x 10 minutes.   [facility] and max assist to prevent L LOB due to poor midlin orientation and pushing tendencies through RUE.   Sit<>stand from WCOasis 8 and blocked practice x 5 throughout treatment with max assit progressing to mod assist with RW.   Gait training with RW and visual feedback from mirror. 2 x 1539fith mod assist to improve R lateral weight shift. Noted improvement in terminal knee extension on this day compared to previous sessions.   WC mobility in [id number] x [id number] supervision assist for improved use of RLE to prevent veer to the L.   Patient returned to room and [facility] in reach and all needs met.        session 2.  Pt received sitting in WC and agreeable to PT. Squat pivot transfer to toilet for urination. Pt able to void successfully. Sit<>stand from toilet x 3 for pericare and clothing management with RUE on rail and min-mod assist. SB [facility] mod assist and max cues for set up, use of LLE, and  improved weight shifting. Pt ten requesting to return to bed. SB transfer to bed with min assist and moderate cues for sequencing and safety. Sit>supine with min assist for LLE contorl. Pt left in bed with call bell in reach and all needs met.     Therapy Documentation Precautions:  Precautions Precautions: Fall Precaution Comments: L side weak/flaccid, right gaze preference, L shoulder pain (risk for subluxation), pressure injury from laying on floor PTA Restrictions Weight Bearing Restrictions: No Pain: denies at rest.   Therapy/Group: Individual Therapy  AustLorie Phenix1/2020, 9:52 AM

## 2018-10-16 ENCOUNTER — Inpatient Hospital Stay (HOSPITAL_COMMUNITY): Payer: Medicare Other | Admitting: Occupational Therapy

## 2018-10-16 DIAGNOSIS — E1165 Type 2 diabetes mellitus with hyperglycemia: Secondary | ICD-10-CM

## 2018-10-16 DIAGNOSIS — N179 Acute kidney failure, unspecified: Secondary | ICD-10-CM

## 2018-10-16 DIAGNOSIS — M25512 Pain in left shoulder: Secondary | ICD-10-CM

## 2018-10-16 DIAGNOSIS — I4891 Unspecified atrial fibrillation: Secondary | ICD-10-CM

## 2018-10-16 DIAGNOSIS — I69391 Dysphagia following cerebral infarction: Secondary | ICD-10-CM

## 2018-10-16 LAB — GLUCOSE, CAPILLARY
Glucose-Capillary: 117 mg/dL — ABNORMAL HIGH (ref 70–99)
Glucose-Capillary: 88 mg/dL (ref 70–99)
Glucose-Capillary: 112 mg/dL — ABNORMAL HIGH (ref 70–99)
Glucose-Capillary: 111 mg/dL — ABNORMAL HIGH (ref 70–99)

## 2018-10-16 MED ORDER — DICLOFENAC SODIUM 1 % TD GEL
2.0000 g | Freq: Four times a day (QID) | TRANSDERMAL | Status: DC
  Administered 2018-10-16 – 2018-11-01 (×63): 2 g via TOPICAL
  Filled 2018-10-16 (×2): qty 100

## 2018-10-16 NOTE — Patient Care Conference (Signed)
Inpatient RehabilitationTeam Conference and Plan of Care Update Date: 10/13/2018   Time: 10:55 AM    Patient Name: Rachel Fox      Medical Record Number: [id number]  Date of Birth: 10/28/1945 Sex: Female         Room/Bed: 4W12C/4W12C-01 Payor Info: Payor: MEDICARE / Plan: MEDICARE PART A AND B / Product Type: *No Product type* /    Admitting Diagnosis: 3. CVA 1 Team  RT. CVA; 22-24days  Admit Date/Time:  09/27/2018  4:04 PM Admission Comments: No comment available   Primary Diagnosis:  <principal problem not specified> Principal Problem: <principal problem not specified>  Patient Active Problem List   Diagnosis Date Noted  . Acute pain of left shoulder   . AKI (acute kidney injury) ([place])   . Controlled type 2 diabetes mellitus with hyperglycemia, without long-term current use of insulin ([place])   . Dysphagia, post-stroke   . Atrial fibrillation ([place])   . Right middle cerebral artery stroke ([place]) 09/27/2018  . Pressure injury of skin 09/21/2018  . Cerebrovascular accident (CVA) with involvement of left side of body ([place]) 09/20/2018  . Atrial fibrillation with RVR ([place]) 09/20/2018  . Hypertension   . Rhabdomyolysis   . Type 2 diabetes mellitus ([place])   . Hyperlipidemia   . Hyperbilirubinemia   . Leukocytosis   . Hypernatremia     Expected Discharge Date: Expected Discharge Date: 10/29/18  Team Members Present: Physician leading conference: Dr. Alysia Penna Social Worker Present: Alfonse Alpers, LCSW Nurse Present: Rayetta Humphrey, RN PT Present: Barrie Folk, PT OT Present: Darleen Crocker, OT SLP Present: Charolett Bumpers, SLP PPS Coordinator present : Ileana Ladd, PT     Current Status/Progress Goal Weekly Team Focus  Medical   Large left buttock decubitus, no evidence of infection, post stroke headaches improved  Reduce skin breakdown, heel left buttock wound, reduce fall risk  Wound care management left buttock wound   Bowel/Bladder   Continent of B/B with  occassional episodes of incontienve of bladder, LBM7/28/20  Maintain continence  Assess nd assist with toileting QS/PRN, anticipate toileting needs q2 Hrs and prn   Swallow/Nutrition/ Hydration   dys 2 and thin, Min A  Supervision A  carryover of swallow strategies and education   ADL's   Mod a bathing w/c level at sink sit<stand, Mod A UB dressing, Max A LB dressing, Dependent with toilet transfers + toileting using Stedy. Max A squat pivot transfers  min A overall  NMR, Lt attention, self care retraining, functional transfers, cognition, strengthening and endurance   Mobility   Mod-max assist bed mobility, mod assist transfer, max assist ambulation  Min-mod assist  safety with transfers, gait WC mobility, improve independence with bed mobility   Communication   Mod A  Min A sentence downgrade 7/29  speech intelligibility strategies, self-awareness   Safety/Cognition/ Behavioral Observations  Mod-Min A semi-complex  Min A - downgraded 7/29  semi-complex problem solving, selective, awareness   Pain   Tylenol po prn for interval of pain rating pain  maintain pain goal < 3  Assess pain QS/PRN and medicate per patieent request, evaluate within guidelin   Skin   Stage 1 pressure injury wound to left lateral heel, and stage 4 to buttocks  Maintain current treatment plan and moitor ,  Daily dressing changes and prn per current orders andf monitoring with doscumentation QS AN PRN    Rehab Goals Patient on target to meet rehab goals: Yes Rehab Goals Revised: some speech goals were  downgraded. *See Care Plan and progress notes for long and short-term goals.     Barriers to Discharge  Current Status/Progress Possible Resolutions Date Resolved   Physician    Wound Care;Medical stability     Slow progress towards goals, no neurologic recovery noted  Continue rehab, consult hydrotherapy      Nursing                  PT                    OT                  SLP                SW                 Discharge Planning/Teaching Needs:  Pt to return to her home with her cousin and son to provide 24/7 assistance.  Niece to also come for GA to assist.  Pt's family can come in closer to d/c for training.   Team Discussion:  Pt with wound care needs over this last week and going forward needs more aggressive treatment, not infected.  Topamax helping headaches.  Pt still with L shoulder pain.  Pt is max A with LB dressing, needs A to blance, and has min A overall goals - at mod/max A now.  Pt was min A with txs last week, but after syncopal epidode she's needed more help and use of L leg is down significantly, requiring mod A.  Pt ambulated +2 with w/c follow and max A to knee.  Midline is better.  ST working on awareness, D2 diet with trials of D3, min/mod comm at sentence level.  Needs lots of cues for speech tasks.  Downgraded communication and problem solving goals.   Revisions to Treatment Plan:  LOS extended    Continued Need for Acute Rehabilitation Level of Care: The patient requires daily medical management by a physician with specialized training in physical medicine and rehabilitation for the following conditions: Daily direction of a multidisciplinary physical rehabilitation program to ensure safe treatment while eliciting the highest outcome that is of practical value to the patient.: Yes Daily medical management of patient stability for increased activity during participation in an intensive rehabilitation regime.: Yes Daily analysis of laboratory values and/or radiology reports with any subsequent need for medication adjustment of medical intervention for : Neurological problems;Wound care problems   I attest that I was present, lead the team conference, and concur with the assessment and plan of the team.Team conference was held via web/ teleconference due to COVID - 19.    Trestin Vences, Vista DeckJennifer Capps 10/16/2018, 6:42 PM

## 2018-10-16 NOTE — Plan of Care (Signed)
  Problem: RH SKIN INTEGRITY Goal: RH STG MAINTAIN SKIN INTEGRITY WITH ASSISTANCE Description: STG Maintain Skin Integrity With Assistance. Mod Outcome: Progressing; stage 4 lt butt ongoing treatment

## 2018-10-16 NOTE — Progress Notes (Signed)
Occupational Therapy Session Note  Patient Details  Name: Rachel Fox MRN: [id number] Date of Birth: 06/20/1945  Today's Date: 10/16/2018 OT Individual Time: 1696-7893 OT Individual Time Calculation (min): 41 min    Short Term Goals: Week 3:  OT Short Term Goal 1 (Week 3): Pt will perform toilet transfer with mod A overall OT Short Term Goal 2 (Week 3): Pt will maintain dynamic standing balance with mod A for LB clothing management. OT Short Term Goal 3 (Week 3): Pt will visually scan to the L for self care items with min cuing.  Skilled Therapeutic Interventions/Progress Updates:    Patient in bed, ready for therapy session.  Pants, teds and slipper socks donned at bed level - max A.  Supine to SSP with min A.  Sit to stand in stedy min a.  stedy transport to commode and w/c with min a for standing at each surface. toileting max A for pants up/down and hygiene - she was continent of bowel and bladder.  Grooming at w/c level seated at sink with set up assist, UB bathing and dressing w/c level mod A.  Patient remained seated in w/c with seat belt alarm set, call bell in reach, left arm tray and puzzle set up for activity at close of session.    Therapy Documentation Precautions:  Precautions Precautions: Fall Precaution Comments: L side weak/flaccid, right gaze preference, L shoulder pain (risk for subluxation), pressure injury from laying on floor PTA Restrictions Weight Bearing Restrictions: No General:   Vital Signs:   Pain: Pain Assessment Pain Scale: 0-10 Pain Score: 0-No pain   Other Treatments:     Therapy/Group: Individual Therapy  Carlos Levering 10/16/2018, 12:09 PM

## 2018-10-16 NOTE — Progress Notes (Signed)
Social Work Patient ID: Rachel Fox, female   DOB: 1946-01-11, 73 y.o.   MRN: [id number]   CSW met with pt 10-13-18 to update her on team conference discussion and extension of stay to 10-29-18.  Then, spoke with pt's niece via telephone on 10-14-18 and updated her.  Both she and pt were in agreement with team that pt needed more time on CIR.  CSW will continue to follow and assist as needed.

## 2018-10-16 NOTE — Progress Notes (Addendum)
[place] PHYSICAL MEDICINE & REHABILITATION PROGRESS NOTE   Subjective/Complaints: Patient seen sitting up this morning.  She states she slept well overnight.  She states she is doing well overall, except for left shoulder pain.  She notes only momentary improvement with muscle rub.   Review of systems: Denies CP,SOB, N/V/D  Objective:   No results found. No results for input(s): WBC, HGB, HCT, PLT in the last 72 hours. No results for input(s): NA, K, CL, CO2, GLUCOSE, BUN, CREATININE, CALCIUM in the last 72 hours.  Intake/Output Summary (Last 24 hours) at 10/16/2018 0853 Last data filed at 10/16/2018 0713 Gross per 24 hour  Intake 690 ml  Output 750 ml  Net -60 ml     Physical Exam: Vital Signs Blood pressure 125/70, pulse 77, temperature 98 F (36.7 C), temperature source Oral, resp. rate 18, height 5\' 3"  (1.6 m), weight 72 kg, SpO2 99 %. Constitutional: No distress . Vital signs reviewed. HENT: Normocephalic.  Atraumatic. Eyes: EOMI. No discharge. Cardiovascular: No JVD. Respiratory: Normal effort. GI: Non-distended. Musc: Left shoulder tenderness. Neurologic: Alert Motor: Grossly 5/5 RUE/RLE Grossly 0/5 LUE/LLE Psych: Normal mood.  Normal affect.  Assessment/Plan: 1. Functional deficits secondary to right MCA infarct which require 3+ hours per day of interdisciplinary therapy in a comprehensive inpatient rehab setting.  Physiatrist is providing close team supervision and 24 hour management of active medical problems listed below.  Physiatrist and rehab team continue to assess barriers to discharge/monitor patient progress toward functional and medical goals  Care Tool:  Bathing    Body parts bathed by patient: Left arm, Abdomen, Chest, Front perineal area, Left upper leg, Right upper leg, Face   Body parts bathed by helper: Right arm, Buttocks, Right lower leg, Left lower leg     Bathing assist Assist Level: Moderate Assistance - Patient 50 - 74%     Upper  Body Dressing/Undressing Upper body dressing   What is the patient wearing?: Pull over shirt    Upper body assist Assist Level: Moderate Assistance - Patient 50 - 74%    Lower Body Dressing/Undressing Lower body dressing      What is the patient wearing?: Incontinence brief, Pants     Lower body assist Assist for lower body dressing: Maximal Assistance - Patient 25 - 49%     Toileting Toileting    Toileting assist Assist for toileting: Total Assistance - Patient < 25%     Transfers Chair/bed transfer  Transfers assist  Chair/bed transfer activity did not occur: Safety/medical concerns  Chair/bed transfer assist level: Maximal Assistance - Patient 25 - 49%     Locomotion Ambulation   Ambulation assist   Ambulation activity did not occur: Safety/medical concerns  Assist level: Moderate Assistance - Patient 50 - 74% Assistive device: Walker-rolling Max distance: 15   Walk 10 feet activity   Assist  Walk 10 feet activity did not occur: Safety/medical concerns  Assist level: Moderate Assistance - Patient - 50 - 74% Assistive device: Walker-rolling   Walk 50 feet activity   Assist Walk 50 feet with 2 turns activity did not occur: Safety/medical concerns         Walk 150 feet activity   Assist Walk 150 feet activity did not occur: Safety/medical concerns         Walk 10 feet on uneven surface  activity   Assist Walk 10 feet on uneven surfaces activity did not occur: Safety/medical concerns         Wheelchair  Assist Will patient use wheelchair at discharge?: Yes Type of Wheelchair: Manual    Wheelchair assist level: Supervision/Verbal cueing Max wheelchair distance: 150    Wheelchair 50 feet with 2 turns activity    Assist        Assist Level: Supervision/Verbal cueing   Wheelchair 150 feet activity     Assist Wheelchair 150 feet activity did not occur: Safety/medical concerns   Assist Level:  Supervision/Verbal cueing    Medical Problem List and Plan: 1.Lefthemiparesis, dysphagia, and visual-spatial deficitssecondary to right MCA infarction  Continue CIR 2. Antithrombotics: -DVT/anticoagulation:Eliquison board -antiplatelet therapy: N/A 3. Pain Management:Tylenol as needed- added topamax for HA with some benefit increased to BID-improving -maintain proper shoulder position and support when in bed and chair.  Discussed with patient, muscle rub changed to Voltaren gel on 8/1 4. Mood:Provide emotional support -antipsychotic agents: N/A 5. Neuropsych: This patientiscapable of making decisions on herown behalf. 6. Skin/Wound Care:Routine skin checks. Aquacel, gen surg evaled there is no evidence of infection but some necrotic tissue noted.  May benefit from hydro therapy  7. Fluids/Electrolytes/Nutrition:Routine in and outs 8.Atrial fibrillation. Amiodarone 200 mg twice daily, Lanoxin 0.125 mg daily, Cardizem 90 mg 4 times daily. Cardiac rate controlled. Follow cardiology services Vitals:   10/15/18 1943 10/16/18 0339  BP: 113/70 125/70  Pulse: (!) 56 77  Resp: 18 18  Temp: 97.8 F (36.6 C) 98 F (36.7 C)  SpO2: 99% 99%   Labile heart rate on 8/1 9.  Post strokedysphagia. Dysphagia #2, thin liquids  Advance diet as tolerated 10. Diabetes mellitus. Hemoglobin A1c 6.1. SSI. Patient on Glucophage 500 mg twice daily prior to admission. Resume as indicated  Relatively stable on 8/1 11.Rhabdomyolysis. no sign of renal failure  12. Hyperlipidemia. Lipitor 13. Constipation. Laxative assistance 14.  Leukocytosis: Resolved  not febrile, ua equivocal, ucx multispecies  WBCs 10.1 on 7/24  Continue to monitor 15.  Hypoalbuminemia  Supplement initiated 16.  AKI  Creatinine 1.11 on 7/24  Encourage fluids  Labs ordered for Monday LOS: 19 days A FACE TO FACE EVALUATION WAS PERFORMED  Shine Scrogham  [name] 10/16/2018, 8:53 AM

## 2018-10-17 DIAGNOSIS — R7309 Other abnormal glucose: Secondary | ICD-10-CM

## 2018-10-17 DIAGNOSIS — G8110 Spastic hemiplegia affecting unspecified side: Secondary | ICD-10-CM

## 2018-10-17 LAB — GLUCOSE, CAPILLARY
Glucose-Capillary: 141 mg/dL — ABNORMAL HIGH (ref 70–99)
Glucose-Capillary: 90 mg/dL (ref 70–99)
Glucose-Capillary: 115 mg/dL — ABNORMAL HIGH (ref 70–99)
Glucose-Capillary: 148 mg/dL — ABNORMAL HIGH (ref 70–99)

## 2018-10-17 MED ORDER — BACLOFEN 5 MG HALF TABLET
5.0000 mg | ORAL_TABLET | Freq: Two times a day (BID) | ORAL | Status: DC
  Administered 2018-10-17 – 2018-10-19 (×5): 5 mg via ORAL
  Filled 2018-10-17 (×5): qty 1

## 2018-10-17 NOTE — Progress Notes (Signed)
Orthopedic Tech Progress Note Patient Details:  Rachel Fox 1945-04-28 [id number] Called in  Orders to [facility] Patient ID: Rachel Fox, female   DOB: [birth year], 73 y.o.   MRN: [id number]   Janit Pagan 10/17/2018, 9:32 AM

## 2018-10-17 NOTE — Progress Notes (Signed)
[place] PHYSICAL MEDICINE & REHABILITATION PROGRESS NOTE   Subjective/Complaints: Patient seen laying in bed this morning.  She states she slept well overnight.  She notes improvement in shoulder pain with Voltaren gel.  Review of systems: Denies CP,SOB, N/V/D  Objective:   No results found. No results for input(s): WBC, HGB, HCT, PLT in the last 72 hours. No results for input(s): NA, K, CL, CO2, GLUCOSE, BUN, CREATININE, CALCIUM in the last 72 hours.  Intake/Output Summary (Last 24 hours) at 10/17/2018 0915 Last data filed at 10/17/2018 0700 Gross per 24 hour  Intake 800 ml  Output 225 ml  Net 575 ml     Physical Exam: Vital Signs Blood pressure (!) 120/58, pulse (!) 58, temperature (!) 97.5 F (36.4 C), temperature source Oral, resp. rate 16, height 5\' 3"  (1.6 m), weight 72 kg, SpO2 98 %. Constitutional: No distress . Vital signs reviewed. HENT: Normocephalic.  Atraumatic. Eyes: EOMI.  No discharge. Cardiovascular: No JVD. Respiratory: Normal effort. GI: Non-distended. Musc: No edema or tenderness in extremities Neurologic: Alert Motor: Grossly 5/5 RUE/RLE LUE: Grossly 1/5 proximal distal LLE: Grossly 2-/5 proximal distal Increased tone noted Psych: Normal mood.  Normal affect.  Assessment/Plan: 1. Functional deficits secondary to right MCA infarct which require 3+ hours per day of interdisciplinary therapy in a comprehensive inpatient rehab setting.  Physiatrist is providing close team supervision and 24 hour management of active medical problems listed below.  Physiatrist and rehab team continue to assess barriers to discharge/monitor patient progress toward functional and medical goals  Care Tool:  Bathing    Body parts bathed by patient: Left arm, Abdomen, Chest, Front perineal area, Left upper leg, Right upper leg, Face   Body parts bathed by helper: Right arm, Buttocks, Right lower leg, Left lower leg     Bathing assist Assist Level: Moderate Assistance  - Patient 50 - 74%     Upper Body Dressing/Undressing Upper body dressing   What is the patient wearing?: Pull over shirt    Upper body assist Assist Level: Moderate Assistance - Patient 50 - 74%    Lower Body Dressing/Undressing Lower body dressing      What is the patient wearing?: Pants, Incontinence brief     Lower body assist Assist for lower body dressing: Maximal Assistance - Patient 25 - 49%     Toileting Toileting    Toileting assist Assist for toileting: Total Assistance - Patient < 25%     Transfers Chair/bed transfer  Transfers assist  Chair/bed transfer activity did not occur: Safety/medical concerns  Chair/bed transfer assist level: Maximal Assistance - Patient 25 - 49%     Locomotion Ambulation   Ambulation assist   Ambulation activity did not occur: Safety/medical concerns  Assist level: Moderate Assistance - Patient 50 - 74% Assistive device: Walker-rolling Max distance: 15   Walk 10 feet activity   Assist  Walk 10 feet activity did not occur: Safety/medical concerns  Assist level: Moderate Assistance - Patient - 50 - 74% Assistive device: Walker-rolling   Walk 50 feet activity   Assist Walk 50 feet with 2 turns activity did not occur: Safety/medical concerns         Walk 150 feet activity   Assist Walk 150 feet activity did not occur: Safety/medical concerns         Walk 10 feet on uneven surface  activity   Assist Walk 10 feet on uneven surfaces activity did not occur: Safety/medical concerns  Wheelchair     Assist Will patient use wheelchair at discharge?: Yes Type of Wheelchair: Manual    Wheelchair assist level: Supervision/Verbal cueing Max wheelchair distance: 150    Wheelchair 50 feet with 2 turns activity    Assist        Assist Level: Supervision/Verbal cueing   Wheelchair 150 feet activity     Assist Wheelchair 150 feet activity did not occur: Safety/medical  concerns   Assist Level: Supervision/Verbal cueing    Medical Problem List and Plan: 1.Lefthemiparesis, dysphagia, and visual-spatial deficitssecondary to right MCA infarction, now with spasticity  Continue CIR  Baclofen 5 twice daily started on 8/2  [facility] ordered 2. Antithrombotics: -DVT/anticoagulation:Continue Eliquis -antiplatelet therapy: N/A 3. Pain Management:Tylenol as needed- added topamax for HA with some benefit increased to BID-improving -maintain proper shoulder position and support when in bed and chair.  Discussed with patient, muscle rub changed to Voltaren gel on 8/1 with benefit 4. Mood:Provide emotional support -antipsychotic agents: N/A 5. Neuropsych: This patientiscapable of making decisions on herown behalf. 6. Skin/Wound Care:Routine skin checks. Aquacel, gen surg evaled there is no evidence of infection but some necrotic tissue noted.  May benefit from hydro therapy  7. Fluids/Electrolytes/Nutrition:Routine in and outs 8.Atrial fibrillation. Amiodarone 200 mg twice daily, Lanoxin 0.125 mg daily, Cardizem 90 mg 4 times daily. Cardiac rate controlled. Follow cardiology services Vitals:   10/17/18 0528 10/17/18 0852  BP: 118/61 (!) 120/58  Pulse: 61 (!) 58  Resp: 16   Temp: (!) 97.5 F (36.4 C)   SpO2: 98%    Labile heart rate on 8/2 9.  Post strokedysphagia. Dysphagia #2, thin liquids  Advance diet as tolerated 10. Diabetes mellitus. Hemoglobin A1c 6.1. SSI. Patient on Glucophage 500 mg twice daily prior to admission. Resume as indicated  Labile on 8/2 11.Rhabdomyolysis. no sign of renal failure  12. Hyperlipidemia. Lipitor 13. Constipation. Laxative assistance 14.  Leukocytosis: Resolved  not febrile, ua equivocal, ucx multispecies  WBCs 10.1 on 7/24  Continue to monitor 15.  Hypoalbuminemia  Supplement initiated 16.  AKI  Creatinine 1.11 on 7/24  Encourage  fluids  Labs ordered for tomorrow LOS: 20 days A FACE TO FACE EVALUATION WAS PERFORMED   [name] 10/17/2018, 9:15 AM

## 2018-10-18 ENCOUNTER — Inpatient Hospital Stay (HOSPITAL_COMMUNITY): Payer: Medicare Other

## 2018-10-18 ENCOUNTER — Other Ambulatory Visit: Payer: Self-pay

## 2018-10-18 ENCOUNTER — Inpatient Hospital Stay (HOSPITAL_COMMUNITY): Payer: Medicare Other | Admitting: Occupational Therapy

## 2018-10-18 ENCOUNTER — Inpatient Hospital Stay (HOSPITAL_COMMUNITY): Payer: Medicare Other | Admitting: Speech Pathology

## 2018-10-18 DIAGNOSIS — I4819 Other persistent atrial fibrillation: Secondary | ICD-10-CM

## 2018-10-18 LAB — GLUCOSE, CAPILLARY
Glucose-Capillary: 119 mg/dL — ABNORMAL HIGH (ref 70–99)
Glucose-Capillary: 95 mg/dL (ref 70–99)
Glucose-Capillary: 94 mg/dL (ref 70–99)
Glucose-Capillary: 112 mg/dL — ABNORMAL HIGH (ref 70–99)

## 2018-10-18 NOTE — Progress Notes (Signed)
Physical Therapy Session Note  Patient Details  Name: Rachel Fox MRN: [id number] Date of Birth: Jul 15, 1945  Today's Date: 10/18/2018 PT Individual Time: 1403-1500 PT Individual Time Calculation (min): 57 min   Short Term Goals: Week 3:  PT Short Term Goal 1 (Week 3): Pt will perform bed mobility with min assist PT Short Term Goal 2 (Week 3): Pt will ambulate 73f with mod assist and LRAD PT Short Term Goal 3 (Week 3): Pt will propell WC [id number] hemi technique and CGA PT Short Term Goal 4 (Week 3): Pt will transfer to and from bed with mod assist consistently  Skilled Therapeutic Interventions/Progress Updates: Pt presented in bed sleeping but easily aroused and agreeable to therapy. Pt stating decreased pain this pm in ankle 5/10. PTA was able to perform ROM inversion/eversion/DF/PF with manual pressure and no c/o significant increase in pain. PTA performed gentle calcaneal mobilizations with no c/o pain and STM to lateral aspect/peroneal tendon with no c/o pain. Pt agreeable to perform sitting balance activities this session. PTA donned pants in bed maxA and pt performed rolling L/R with minA as PTA pulled pants over hips. Pt performed supine to sit with HOB elevated and modA with max cues and facilitation for shifting L hip forward. Squat pivot to L maxA to w/c. Pt then transported to rehab gym and performed squat pivot with mod/maxA to R. Participated in sitting balance activities including reaching/placing cards on board with bias to L. Pt able to return to midline with min/mod challenges however required minA to return to neutral with far reaching activities. Pt also participated in similar activity throwing horseshoes with PTA encouraging pt to increase velocity to throw horseshoe more than [id number] front of her. Pt returned to w/c in same manner as prior and pt transported back to room. Pt performed squat pivot to bed maxA and required maxA sit to supine. Pt was able [facility]  [facility] blocking R foot and pt using RUE on bed rail. Pt left in sidelying with pillows propped, call bell within reach and needs met.      Therapy Documentation Precautions:  Precautions Precautions: Fall Precaution Comments: L side weak/flaccid, right gaze preference, L shoulder pain (risk for subluxation), pressure injury from laying on floor PTA Restrictions Weight Bearing Restrictions: No General:   Vital Signs: Therapy Vitals Pulse Rate: (!) 54 BP: 122/64 Patient Position (if appropriate): Lying Oxygen Therapy SpO2: 99 % Pain: Pain Assessment Pain Score: 4    Therapy/Group: Individual Therapy  Rosali Augello  Babs Dabbs, PTA  10/18/2018, 3:43 PM

## 2018-10-18 NOTE — Progress Notes (Signed)
Speech Language Pathology Daily Session Note  Patient Details  Name: Rachel Fox MRN: [id number] Date of Birth: 04/16/1945  Today's Date: 10/18/2018 SLP Individual Time: 0730-0830 SLP Individual Time Calculation (min): 60 min  Short Term Goals: Week 3: SLP Short Term Goal 1 (Week 3): Patient will consume Dys. 2 textures and thin liquids via cup with efficient mastication and complete oral clearance without overt s/s of aspiration with Supervision verbal cues for use of swallowing compensatory strategies. SLP Short Term Goal 2 (Week 3): Patient will utilize speech intelligibility strategies at the sentence level with Min A verbal cues to achieve ~70% intelligibility. SLP Short Term Goal 3 (Week 3): Patient will demonstrate problem solving in semi-complex tasks with Min A verbal cues. SLP Short Term Goal 4 (Week 3): Patient will self-monitor and correct errors during semi-complex problem solving tasks with Min verbal and visual cues. SLP Short Term Goal 5 (Week 3): Patient will utilize memory compensatory strategies to recall new, daily information with supervision A verbal cues.  Skilled Therapeutic Interventions:  Skilled treatment session focused on dysphagia and cognition goals. SLP facilitated session by providing skilled observation of pt consuming dysphagia 3 breakfast with thin liquids via cup. Pt required initial Min A cues for left anterior spillage but was able to correct after initial cue. No overt s/s of aspiration observed  Of note, pt unable to problem solve or maintain upright neutral positioning in bed. She required support from pillows on her right side d/t lean  in bed. Despite Max A cues, pt is not able to self-monitor for decreased speech intelligibility. Given noise of air mattress and low vocal intensity, pt requires cues to increase vocal intensity for every idea ocmmunicated. With Min A cues, pt able to complete semi-complex medication management task. Pt left upright in  bed, bed alarm on and all needs within reach. Continue per current plan of care.       Pain Pain Assessment Pain Score: Asleep  Therapy/Group: Individual Therapy  Rachel Fox 10/18/2018, 8:43 AM

## 2018-10-18 NOTE — Progress Notes (Signed)
Occupational Therapy Session Note  Patient Details  Name: Rachel Fox MRN: [id number] Date of Birth: June 30, 1945  Today's Date: 10/18/2018 OT Individual Time: 1015-1057 OT Individual Time Calculation (min): 42 min  and Today's Date: 10/18/2018 OT Missed Time: 18 Minutes Missed Time Reason: Pain   Short Term Goals: Week 3:  OT Short Term Goal 1 (Week 3): Pt will perform toilet transfer with mod A overall OT Short Term Goal 2 (Week 3): Pt will maintain dynamic standing balance with mod A for LB clothing management. OT Short Term Goal 3 (Week 3): Pt will visually scan to the L for self care items with min cuing.  Skilled Therapeutic Interventions/Progress Updates:    Upon entering the room, pt sleeping soundly and needing multiple attempts to fully waken. Pt verbalized urgency for toileting and performed supine >sit with mod A to EOB. Pt began crying that R heel was painful. OT notified RN and requesting pain medication. Pt still wanting to go to bathroom. Pt needing total A for stand pivot transfer with very little effort. OT assisted pt into bathroom via wheelchair and again total A stand pivot onto drop arm commode chair over toilet. Pt able to void and performed hygiene with set up A. Max A for standing balance and total A to pull brief over B hips. Pt continues to be very emotional and requests to return to bed secondary to ankle pain. OT assists pt back to bed and RN assessing R ankle with noted edema. Medication given and OT elevated B LEs. Call bell and all needed items within reach upon exiting the room.   Therapy Documentation Precautions:  Precautions Precautions: Fall Precaution Comments: L side weak/flaccid, right gaze preference, L shoulder pain (risk for subluxation), pressure injury from laying on floor PTA Restrictions Weight Bearing Restrictions: No General: General OT Amount of Missed Time: 18 Minutes Vital Signs: Therapy Vitals BP: 122/70 Pain: Pain Assessment Pain  Scale: 0-10 Pain Score: 3    Therapy/Group: Individual Therapy  Gypsy Decant 10/18/2018, 11:13 AM

## 2018-10-18 NOTE — Progress Notes (Signed)
[place] PHYSICAL MEDICINE & REHABILITATION PROGRESS NOTE   Subjective/Complaints: Eating with SLP in bed. No new issues over weekend. Pain controlled. Sleeping. Still spastic on right  ROS: Patient denies fever, rash, sore throat, blurred vision, nausea, vomiting, diarrhea, cough, shortness of breath or chest pain, joint or back pain, headache, or mood change.   Objective:   No results found. No results for input(s): WBC, HGB, HCT, PLT in the last 72 hours. No results for input(s): NA, K, CL, CO2, GLUCOSE, BUN, CREATININE, CALCIUM in the last 72 hours.  Intake/Output Summary (Last 24 hours) at 10/18/2018 1011 Last data filed at 10/18/2018 0856 Gross per 24 hour  Intake 480 ml  Output 350 ml  Net 130 ml     Physical Exam: Vital Signs Blood pressure 122/70, pulse 64, temperature 97.6 F (36.4 C), resp. rate 16, height 5\' 3"  (1.6 m), weight 72 kg, SpO2 99 %. Constitutional: No distress . Vital signs reviewed. HEENT: EOMI, oral membranes moist Neck: supple Cardiovascular: RRR without murmur. No JVD    Respiratory: CTA Bilaterally without wheezes or rales. Normal effort    GI: BS +, non-tender, non-distended  Musc: No edema or tenderness in extremities Neurologic: Alert Motor: Grossly 5/5 RUE/RLE LUE: Grossly 1/5 proximal distal LLE: Grossly 2-/5 proximal distal Increased tone noted Psych: Normal mood.  Normal affect.  Assessment/Plan: 1. Functional deficits secondary to right MCA infarct which require 3+ hours per day of interdisciplinary therapy in a comprehensive inpatient rehab setting.  Physiatrist is providing close team supervision and 24 hour management of active medical problems listed below.  Physiatrist and rehab team continue to assess barriers to discharge/monitor patient progress toward functional and medical goals  Care Tool:  Bathing    Body parts bathed by patient: Left arm, Abdomen, Chest, Front perineal area, Left upper leg, Right upper leg, Face   Body parts bathed by helper: Right arm, Buttocks, Right lower leg, Left lower leg     Bathing assist Assist Level: Moderate Assistance - Patient 50 - 74%     Upper Body Dressing/Undressing Upper body dressing   What is the patient wearing?: Pull over shirt    Upper body assist Assist Level: Moderate Assistance - Patient 50 - 74%    Lower Body Dressing/Undressing Lower body dressing      What is the patient wearing?: Pants, Incontinence brief     Lower body assist Assist for lower body dressing: Maximal Assistance - Patient 25 - 49%     Toileting Toileting    Toileting assist Assist for toileting: Total Assistance - Patient < 25%     Transfers Chair/bed transfer  Transfers assist  Chair/bed transfer activity did not occur: Safety/medical concerns  Chair/bed transfer assist level: Maximal Assistance - Patient 25 - 49%     Locomotion Ambulation   Ambulation assist   Ambulation activity did not occur: Safety/medical concerns  Assist level: Moderate Assistance - Patient 50 - 74% Assistive device: Walker-rolling Max distance: 15   Walk 10 feet activity   Assist  Walk 10 feet activity did not occur: Safety/medical concerns  Assist level: Moderate Assistance - Patient - 50 - 74% Assistive device: Walker-rolling   Walk 50 feet activity   Assist Walk 50 feet with 2 turns activity did not occur: Safety/medical concerns         Walk 150 feet activity   Assist Walk 150 feet activity did not occur: Safety/medical concerns         Walk 10 feet on  uneven surface  activity   Assist Walk 10 feet on uneven surfaces activity did not occur: Safety/medical concerns         Wheelchair     Assist Will patient use wheelchair at discharge?: Yes Type of Wheelchair: Manual    Wheelchair assist level: Supervision/Verbal cueing Max wheelchair distance: 150    Wheelchair 50 feet with 2 turns activity    Assist        Assist Level:  Supervision/Verbal cueing   Wheelchair 150 feet activity     Assist Wheelchair 150 feet activity did not occur: Safety/medical concerns   Assist Level: Supervision/Verbal cueing    Medical Problem List and Plan: 1.Lefthemiparesis, dysphagia, and visual-spatial deficitssecondary to right MCA infarction, now with spasticity  Continue CIR  Baclofen 5 twice daily started on 8/2---observe for effects  WHO/PRAFO   2. Antithrombotics: -DVT/anticoagulation:Continue Eliquis -antiplatelet therapy: N/A 3. Pain Management:Tylenol as needed- added topamax for HA with some benefit increased to BID-improving -maintain proper shoulder position and support when in bed and chair.    -muscle rub changed to Voltaren gel on 8/1 with benefit 4. Mood:Provide emotional support -antipsychotic agents: N/A 5. Neuropsych: This patientiscapable of making decisions on herown behalf. 6. Skin/Wound Care:Routine skin checks. Aquacel, gen surg evaled there is no evidence of infection but some necrotic tissue noted.  May benefit from hydro therapy  7. Fluids/Electrolytes/Nutrition:Routine in and outs 8.Atrial fibrillation. Amiodarone 200 mg twice daily, Lanoxin 0.125 mg daily, Cardizem 90 mg 4 times daily. Cardiac rate controlled. Follow cardiology services Vitals:   10/18/18 0311 10/18/18 0901  BP: 118/68 122/70  Pulse: 64   Resp: 16   Temp: 97.6 F (36.4 C)   SpO2: 99%    HR controlled 9.  Post strokedysphagia. Dysphagia #2, thin liquids  Advance diet per SLP 10. Diabetes mellitus. Hemoglobin A1c 6.1. SSI. Patient on Glucophage 500 mg twice daily prior to admission. Resume as indicated  Reasonable control 8/3 11.Rhabdomyolysis. no sign of renal failure  12. Hyperlipidemia. Lipitor 13. Constipation. Laxative assistance 14.  Leukocytosis: Resolved  not febrile, ua equivocal, ucx multispecies  WBCs 10.1 on 7/24  Continue  to monitor 15.  Hypoalbuminemia  Supplement initiated 16.  AKI  Creatinine 1.11 on 7/24  Encourage fluids  Labs ordered for today?  -re-order for tomorrow LOS: 21 days A FACE TO FACE EVALUATION WAS PERFORMED  Meredith Staggers 10/18/2018, 10:11 AM

## 2018-10-19 ENCOUNTER — Inpatient Hospital Stay (HOSPITAL_COMMUNITY): Payer: Medicare Other | Admitting: *Deleted

## 2018-10-19 ENCOUNTER — Inpatient Hospital Stay (HOSPITAL_COMMUNITY): Payer: Medicare Other | Admitting: Speech Pathology

## 2018-10-19 ENCOUNTER — Inpatient Hospital Stay (HOSPITAL_COMMUNITY): Payer: Medicare Other | Admitting: Physical Therapy

## 2018-10-19 ENCOUNTER — Inpatient Hospital Stay (HOSPITAL_COMMUNITY): Payer: Medicare Other | Admitting: Occupational Therapy

## 2018-10-19 LAB — GLUCOSE, CAPILLARY
Glucose-Capillary: 101 mg/dL — ABNORMAL HIGH (ref 70–99)
Glucose-Capillary: 114 mg/dL — ABNORMAL HIGH (ref 70–99)
Glucose-Capillary: 82 mg/dL (ref 70–99)
Glucose-Capillary: 90 mg/dL (ref 70–99)

## 2018-10-19 LAB — BASIC METABOLIC PANEL
Anion gap: 10 (ref 5–15)
BUN: 17 mg/dL (ref 8–23)
CO2: 22 mmol/L (ref 22–32)
Calcium: 9.9 mg/dL (ref 8.9–10.3)
Chloride: 109 mmol/L (ref 98–111)
Creatinine, Ser: 1.01 mg/dL — ABNORMAL HIGH (ref 0.44–1.00)
GFR calc Af Amer: >60 mL/min (ref >60)
GFR calc non Af Amer: 56 mL/min — ABNORMAL LOW (ref >60)
Glucose, Bld: 172 mg/dL — ABNORMAL HIGH (ref 70–99)
Potassium: 4.7 mmol/L (ref 3.5–5.1)
Sodium: 141 mmol/L (ref 135–145)

## 2018-10-19 MED ORDER — BACLOFEN 10 MG PO TABS
10.0000 mg | ORAL_TABLET | Freq: Two times a day (BID) | ORAL | Status: DC
  Administered 2018-10-19 – 2018-10-21 (×4): 10 mg via ORAL
  Filled 2018-10-19 (×4): qty 1

## 2018-10-19 MED ORDER — TRAMADOL HCL 50 MG PO TABS
50.0000 mg | ORAL_TABLET | Freq: Two times a day (BID) | ORAL | Status: DC | PRN
  Administered 2018-10-19 – 2018-10-24 (×4): 50 mg via ORAL
  Filled 2018-10-19 (×5): qty 1

## 2018-10-19 MED ORDER — OXYBUTYNIN CHLORIDE 5 MG PO TABS
5.0000 mg | ORAL_TABLET | Freq: Every day | ORAL | Status: DC
  Administered 2018-10-19 – 2018-10-20 (×2): 5 mg via ORAL
  Filled 2018-10-19 (×2): qty 1

## 2018-10-19 MED ORDER — MUSCLE RUB 10-15 % EX CREA: TOPICAL_CREAM | Freq: Two times a day (BID) | CUTANEOUS | Status: DC

## 2018-10-19 NOTE — Progress Notes (Addendum)
[place] PHYSICAL MEDICINE & REHABILITATION PROGRESS NOTE   Subjective/Complaints: Having more urinary frequency. Left shoulder tender. Pt upset, tearful.   ROS: Patient denies fever, rash, sore throat, blurred vision, nausea, vomiting, diarrhea, cough, shortness of breath or chest pain, joint or back pain, headache, or mood change.   Objective:   No results found. No results for input(s): WBC, HGB, HCT, PLT in the last 72 hours. Recent Labs    10/19/18 0851  NA 141  K 4.7  CL 109  CO2 22  GLUCOSE 172*  BUN 17  CREATININE 1.01*  CALCIUM 9.9    Intake/Output Summary (Last 24 hours) at 10/19/2018 1003 Last data filed at 10/19/2018 0900 Gross per 24 hour  Intake 480 ml  Output 200 ml  Net 280 ml     Physical Exam: Vital Signs Blood pressure 133/66, pulse 73, temperature 98 F (36.7 C), resp. rate 18, height 5\' 3"  (1.6 m), weight 72 kg, SpO2 100 %. Constitutional: No distress . Vital signs reviewed. HEENT: EOMI, oral membranes moist Neck: supple Cardiovascular: RRR without murmur. No JVD    Respiratory: CTA Bilaterally without wheezes or rales. Normal effort    GI: BS +, non-tender, non-distended   Musc: No edema or tenderness in extremities Neurologic: Alert Motor: Grossly 5/5 RUE/RLE LUE: Grossly 1/5 proximal distal LLE: Grossly 2-/5 proximal distal Tone 2/4 LUE and LLE Psych: Normal mood.  Normal affect.  Assessment/Plan: 1. Functional deficits secondary to right MCA infarct which require 3+ hours per day of interdisciplinary therapy in a comprehensive inpatient rehab setting.  Physiatrist is providing close team supervision and 24 hour management of active medical problems listed below.  Physiatrist and rehab team continue to assess barriers to discharge/monitor patient progress toward functional and medical goals  Care Tool:  Bathing    Body parts bathed by patient: Left arm, Abdomen, Chest, Front perineal area, Left upper leg, Right upper leg, Face   Body parts bathed by helper: Right arm, Buttocks, Right lower leg, Left lower leg     Bathing assist Assist Level: Moderate Assistance - Patient 50 - 74%     Upper Body Dressing/Undressing Upper body dressing   What is the patient wearing?: Pull over shirt    Upper body assist Assist Level: Moderate Assistance - Patient 50 - 74%    Lower Body Dressing/Undressing Lower body dressing      What is the patient wearing?: Pants, Incontinence brief     Lower body assist Assist for lower body dressing: Maximal Assistance - Patient 25 - 49%     Toileting Toileting    Toileting assist Assist for toileting: Total Assistance - Patient < 25%     Transfers Chair/bed transfer  Transfers assist  Chair/bed transfer activity did not occur: Safety/medical concerns  Chair/bed transfer assist level: Total Assistance - Patient < 25%     Locomotion Ambulation   Ambulation assist   Ambulation activity did not occur: Safety/medical concerns  Assist level: Moderate Assistance - Patient 50 - 74% Assistive device: Walker-rolling Max distance: 15   Walk 10 feet activity   Assist  Walk 10 feet activity did not occur: Safety/medical concerns  Assist level: Moderate Assistance - Patient - 50 - 74% Assistive device: Walker-rolling   Walk 50 feet activity   Assist Walk 50 feet with 2 turns activity did not occur: Safety/medical concerns         Walk 150 feet activity   Assist Walk 150 feet activity did not occur: Safety/medical concerns  Walk 10 feet on uneven surface  activity   Assist Walk 10 feet on uneven surfaces activity did not occur: Safety/medical concerns         Wheelchair     Assist Will patient use wheelchair at discharge?: Yes Type of Wheelchair: Manual    Wheelchair assist level: Supervision/Verbal cueing Max wheelchair distance: 150    Wheelchair 50 feet with 2 turns activity    Assist        Assist Level:  Supervision/Verbal cueing   Wheelchair 150 feet activity     Assist Wheelchair 150 feet activity did not occur: Safety/medical concerns   Assist Level: Supervision/Verbal cueing    Medical Problem List and Plan: 1.Lefthemiparesis, dysphagia, and visual-spatial deficitssecondary to right MCA infarction, now with spasticity  Continue CIR  Baclofen 5 twice daily started on 8/2---increase to 10mg  bid  Continue WHO/PRAFO   2. Antithrombotics: -DVT/anticoagulation:Continue Eliquis -antiplatelet therapy: N/A 3. Pain Management:Tylenol as needed- added topamax for HA with some benefit increased to BID-improving -maintain proper shoulder position and support when in bed and chair.    - Voltaren gel-  -add tramadol 50mg  q12 prn for severe pain 4. Mood:Provide emotional support -antipsychotic agents: N/A  -neuro-psych input would be helpful  -may have some disinhibition related to location of CVA  -?reactive anxiety/depression also may be at play 5. Neuropsych: This patientiscapable of making decisions on herown behalf. 6. Skin/Wound Care:Routine skin checks. Aquacel, gen surg evaled there is no evidence of infection but some necrotic tissue noted.  May benefit from hydro therapy  7. Fluids/Electrolytes/Nutrition:Routine in and outs 8.Atrial fibrillation. Amiodarone 200 mg twice daily, Lanoxin 0.125 mg daily, Cardizem 90 mg 4 times daily. Cardiac rate controlled. Follow up cardiology services Vitals:   10/18/18 1926 10/19/18 0620  BP: (!) 142/64 133/66  Pulse: (!) 108 73  Resp: 19 18  Temp: 97.6 F (36.4 C) 98 F (36.7 C)  SpO2: 100% 100%   HR controlled8/4 9.  Post strokedysphagia. Dysphagia #2, thin liquids  Advance diet per SLP 10. Diabetes mellitus. Hemoglobin A1c 6.1. SSI. Patient on Glucophage 500 mg twice daily prior to admission. Resume as indicated  Reasonable control 8/4 11.Rhabdomyolysis. no sign  of renal failure  12. Hyperlipidemia. Lipitor 13. Constipation. Laxative assistance 14.  Leukocytosis: Resolved  not febrile, ua equivocal, ucx multispecies  WBCs 10.1 on 7/24  Recheck Thursday 15.  Hypoalbuminemia  Supplement initiated 16.  AKI  Creatinine 1.01 on 8.4   I personally reviewed the patient's labs today.   17. Urinary frequency: had some at baseline  -need to check PVR's  -increase ditropan to 5mg  qhs  -urine clear, no odor, won't check UA/UCx yet  LOS: 22 days A FACE TO FACE EVALUATION WAS PERFORMED  Ranelle OysterZachary T  10/19/2018, 10:03 AM

## 2018-10-19 NOTE — Progress Notes (Signed)
Occupational Therapy Session Note  Patient Details  Name: Rachel Fox MRN: [id number] Date of Birth: 06-Mar-1946  Today's Date: 10/19/2018 OT Individual Time: 4680-3212 OT Individual Time Calculation (min): 69 min    Short Term Goals: Week 3:  OT Short Term Goal 1 (Week 3): Pt will perform toilet transfer with mod A overall OT Short Term Goal 2 (Week 3): Pt will maintain dynamic standing balance with mod A for LB clothing management. OT Short Term Goal 3 (Week 3): Pt will visually scan to the L for self care items with min cuing.  Skilled Therapeutic Interventions/Progress Updates:     Upon entering the room, pt very upset and tearful over toileting needs. NT present in the room assisting her with female urinal. OT utilizing therapeutic use of self to calm pt who was then agreeable to OT intervention. Supine >Sit with mod A to EOB. Pt transferred with max A to wheelchair and seated at sink for grooming tasks. OT attempting to utilize L UE for hand over hand tasks but difficulty due to increased tone present and pt reports pain with stretching. Pt standing with mod progressing to min A at sink for LB clothing management. Pt reports feeling very fatigued but agreeable to remain seated in wheelchair at end of session with call bell within reach and chair alarm belt donned.   Therapy Documentation Precautions:  Precautions Precautions: Fall Precaution Comments: L side weak/flaccid, right gaze preference, L shoulder pain (risk for subluxation), pressure injury from laying on floor PTA Restrictions Weight Bearing Restrictions: No    Pain: Pain Assessment Pain Scale: 0-10 Pain Score: 5  Pain Type: Acute pain Pain Location: Knee Pain Orientation: Left Pain Descriptors / Indicators: Aching Pain Intervention(s): RN made aware   Therapy/Group: Individual Therapy  Gypsy Decant 10/19/2018, 12:17 PM

## 2018-10-19 NOTE — Progress Notes (Signed)
Physical Therapy Session Note  Patient Details  Name: Rachel Fox MRN: [id number] Date of Birth: 1945-05-31  Today's Date: 10/19/2018 PT Individual Time: 2248-2500 PT Individual Time Calculation (min): 70 min   Short Term Goals: Week 3:  PT Short Term Goal 1 (Week 3): Pt will perform bed mobility with min assist PT Short Term Goal 2 (Week 3): Pt will ambulate 56f with mod assist and LRAD PT Short Term Goal 3 (Week 3): Pt will propell WC 1055fwith hemi technique and CGA PT Short Term Goal 4 (Week 3): Pt will transfer to and from bed with mod assist consistently  Skilled Therapeutic Interventions/Progress Updates: Pt presented in w/c agreeable to therapy. Pt c/o pain at neck and shoulders with pt indicating had been sleeping in w/c with head in extension. PTA advised nsg and had pt perform neck rotation, lateral leans, and forward flexion to provide gentle stretching. PTA also applied Voltaren gel after advising nsg of pt's current status. Pt transported to day room and participated in sitting balance with mirror feedback to correct. Pt required tactile cues for correcting shoulders and correcting R rotation of neck. Pt performed STS with RW x 4 with mirror feedback with PTA providing multimodal cues for correcting trunk rotation, and L lateral lean. Pt also required max facilitation to engage L quad to promote knee extension.  Pt was able to correct L lateral lean approx 50% of time with verbal cues only. Pt also performed STS with PTA in front of pt x 2 with PTA stabilizing hips to decrease rotation and PTA blocking L knee. Participated in gait training x 10 ft with modA with facilitation of wt shifting to R to allow pt to advance LLE. Pt noted to fatigue quickly and unable to advance LLE after approx 1060fPt transported back to room and performed squat pivot transfer mod/maxA and pt requiring modA to complete transfer to supine. Pt able to turn to R side using bed rail with supervision and  pillows propped behind pt for comfort. Pt left with call bell within reach and needs met.      Therapy Documentation Precautions:  Precautions Precautions: Fall Precaution Comments: L side weak/flaccid, right gaze preference, L shoulder pain (risk for subluxation), pressure injury from laying on floor PTA Restrictions Weight Bearing Restrictions: No General:   Vital Signs: Therapy Vitals Temp: (!) 97.5 F (36.4 C) Pulse Rate: 60 Resp: 18 BP: 111/61 Patient Position (if appropriate): Sitting Oxygen Therapy SpO2: 100 % Pain: Pain Assessment Pain Scale: 0-10 Pain Score: 3  Pain Type: Acute pain Pain Location: Foot Pain Orientation: Right Pain Descriptors / Indicators: Discomfort Pain Frequency: Intermittent Pain Onset: Gradual Patients Stated Pain Goal: 0 Pain Intervention(s): Medication (See eMAR)   Therapy/Group: Individual Therapy  Lashun Ramseyer  Reagan Klemz, PTA  10/19/2018, 3:43 PM

## 2018-10-19 NOTE — Progress Notes (Signed)
Speech Language Pathology Daily Session Note  Patient Details  Name: Rachel Fox MRN: [id number] Date of Birth: 1945/11/15  Today's Date: 10/19/2018 SLP Individual Time: 0933-1003 SLP Individual Time Calculation (min): 30 min  Short Term Goals: Week 3: SLP Short Term Goal 1 (Week 3): Patient will consume Dys. 2 textures and thin liquids via cup with efficient mastication and complete oral clearance without overt s/s of aspiration with Supervision verbal cues for use of swallowing compensatory strategies. SLP Short Term Goal 2 (Week 3): Patient will utilize speech intelligibility strategies at the sentence level with Min A verbal cues to achieve ~70% intelligibility. SLP Short Term Goal 3 (Week 3): Patient will demonstrate problem solving in semi-complex tasks with Min A verbal cues. SLP Short Term Goal 4 (Week 3): Patient will self-monitor and correct errors during semi-complex problem solving tasks with Min verbal and visual cues. SLP Short Term Goal 5 (Week 3): Patient will utilize memory compensatory strategies to recall new, daily information with supervision A verbal cues.  Skilled Therapeutic Interventions:  Pt was seen for skilled ST targeting dysphagia and speech intelligibility goals.  Pt was sitting up in wheelchair upon therapist's arrival, requesting to get back into bed but agreeable to staying up for therapy session.  During informal conversations with therapist, pt needed min-mod assist verbal cues to increase vocal intensity to achieve intelligibility.   Therapist facilitated the session with a structured verbal description task with a visual barrier to elicit carryover of intelligibility strategies.  Pt was intelligible at the phrase level in a mildly distracting/noisy environment with min cues to increase vocal intensity.  Therapist also facilitated the session with a trial snack of dys 3 textures to continue working towards diet advancement.  Pt needed mod assist cues for use  of compensatory strategies to clear mild left sided buccal residue.  At this time, I would not suggest diet advancement primarily due to mentation/cognitive deficits impacting pt's ability to consistently utilize safe swallowing strategies.  Pt was left in wheelchair with chair alarm set and call bell within reach.  Continue per current plan of care.     Pain Pain Assessment Pain Scale: 0-10 Pain Score: 5  Pain Type: Acute pain Pain Location: Knee Pain Orientation: Left Pain Descriptors / Indicators: Aching Pain Intervention(s): RN made aware  Therapy/Group: Individual Therapy  Ki Corbo, Selinda Orion 10/19/2018, 10:04 AM

## 2018-10-19 NOTE — Progress Notes (Signed)
Physical Therapy Session Note  Patient Details  Name: Rachel Fox MRN: [id number] Date of Birth: [birth year]  Today's Date: 10/19/2018 PT Individual Time: 1133-1157 PT Individual Time Calculation (min): 24 min   Short Term Goals: Week 3:  PT Short Term Goal 1 (Week 3): Pt will perform bed mobility with min assist PT Short Term Goal 2 (Week 3): Pt will ambulate 73ft with mod assist and LRAD PT Short Term Goal 3 (Week 3): Pt will propell WC 123ft with hemi technique and CGA PT Short Term Goal 4 (Week 3): Pt will transfer to and from bed with mod assist consistently  Skilled Therapeutic Interventions/Progress Updates:  Pt received in w/c & agreeable to tx. Transported pt to gym via w/c dependent assist for time management. Pt transfers sit>stand with max assist with max cuing for anterior pelvic shift, hamstring activation & upright posture as pt demonstrates posterior lean. Pt engaged in dynavision in standing with max assist with focus on weight bearing through LLE for strengthening & NMR, standing balance & midline orientation with pt requiring cuing to correct L lateral lean, and activity tolerance. Pt also engaged in dynavision from w/c level with distance between her & board with focus on anterior weight shift as pt sits & stands in a posterior lean. Attempted to have pt propel w/c back to room with pillow positioned behind her to promote upright posture but pt unable to reach floor with RLE to attempt hemi propulsion; pt would benefit from hemi height w/c. Assisted pt with hand hygiene at sink from w/c level with total assist to elevate LUE but pt able to obtain soap & paper towels & complete hand hygiene. Therapist donned L hand splint total assist. Pt left in w/c with chair alarm set & call bell in reach .   Therapy Documentation Precautions:  Precautions Precautions: Fall Precaution Comments: L side weak/flaccid, right gaze preference, L shoulder pain (risk for subluxation), pressure  injury from laying on floor PTA Restrictions Weight Bearing Restrictions: No  Pain: Pt c/o unrated pain in L shoulder & burning in R hand - RN made aware & meds requested.    Therapy/Group: Individual Therapy  Waunita Schooner 10/19/2018, 12:23 PM

## 2018-10-20 ENCOUNTER — Inpatient Hospital Stay (HOSPITAL_COMMUNITY): Payer: Medicare Other | Admitting: Occupational Therapy

## 2018-10-20 ENCOUNTER — Inpatient Hospital Stay (HOSPITAL_COMMUNITY): Payer: Medicare Other | Admitting: Speech Pathology

## 2018-10-20 ENCOUNTER — Inpatient Hospital Stay (HOSPITAL_COMMUNITY): Payer: Medicare Other

## 2018-10-20 LAB — GLUCOSE, CAPILLARY
Glucose-Capillary: 127 mg/dL — ABNORMAL HIGH (ref 70–99)
Glucose-Capillary: 119 mg/dL — ABNORMAL HIGH (ref 70–99)
Glucose-Capillary: 100 mg/dL — ABNORMAL HIGH (ref 70–99)
Glucose-Capillary: 125 mg/dL — ABNORMAL HIGH (ref 70–99)

## 2018-10-20 NOTE — Progress Notes (Signed)
Physical Therapy Session Note  Patient Details  Name: Rachel Fox MRN: [id number] Date of Birth: 1946-02-26  Today's Date: 10/20/2018 PT Individual Time: 1305-1420 PT Individual Time Calculation (min): 75 min   Short Term Goals: Week 3:  PT Short Term Goal 1 (Week 3): Pt will perform bed mobility with min assist PT Short Term Goal 2 (Week 3): Pt will ambulate 90f with mod assist and LRAD PT Short Term Goal 3 (Week 3): Pt will propell WC 1034fwith hemi technique and CGA PT Short Term Goal 4 (Week 3): Pt will transfer to and from bed with mod assist consistently  Skilled Therapeutic Interventions/Progress Updates: Pt presented in bed with nsg present agreeable to therapy. Pt stating some pain in neck and shoulders with nsg applying Voltaren with relief per pt. Pt performed supine to sit at EOB with use of bed features with minA and increased time. Upon sitting pt indicated need for urinary void. Pt performed squat pivot transfer to R with modA and transported to bathroom. Performed stand pivot to toilet with use of wall rail and modA with verbal cues for correct LE sequencing. Required total A for clothing management (+void) and peri-care. Returned to w/c in same manner as prior. Pt transported to day room and participated in STS from w/c and gait training with RW. Pt required max multimodal cues and mirror feedback due to increased L lean this session and difficulty engaging L knee extension while in standing. After multiple attempts pt was able to correct midline orientation and advance LLE without assist. Pt ambulated approx 1046fith modA then becoming fatigued and more notably distracted. Pt then transferred to [facility] participated in L3 with BLE and RUE only x 10 min with pt more alert and engaged. Pt returned to w/c in same manner as prior, and attempted w/c mobility x [id number] modA due to poor coordination of use of RLE and RUE as well as difficulty with pt consistently placing R  foot on ground. Pt transported remaining distance to room and performed squat pivot transfer to bed on R side modA. Pt required heavy modA for BLE management onto bed. Pt left in bed in sidelying with pillows propped for support behind pt, call bell within reach and needs met.      Therapy Documentation Precautions:  Precautions Precautions: Fall Precaution Comments: L side weak/flaccid, right gaze preference, L shoulder pain (risk for subluxation), pressure injury from laying on floor PTA Restrictions Weight Bearing Restrictions: No General:   Vital Signs: Therapy Vitals Temp: 97.6 F (36.4 C) Temp Source: Oral Pulse Rate: 63 Resp: 16 BP: 114/68 Patient Position (if appropriate): Lying Oxygen Therapy SpO2: 100 % O2 Device: Room Air Pain: Pain Assessment Pain Scale: 0-10 Pain Score: 0-No pain    Therapy/Group: Individual Therapy  Reo Portela  Phillipa Morden, PTA  10/20/2018, 2:36 PM

## 2018-10-20 NOTE — Progress Notes (Signed)
Occupational Therapy Session Note  Patient Details  Name: Rachel Fox MRN: [id number] Date of Birth: Dec 04, 1945  Today's Date: 10/20/2018 OT Individual Time: 6728-9791 OT Individual Time Calculation (min): 60 min    Short Term Goals: Week 2:  OT Short Term Goal 1 (Week 2): Pt will be able to complete a squat pivot transfer to the toilet with min A (vs using the stedy) OT Short Term Goal 1 - Progress (Week 2): Not met OT Short Term Goal 2 (Week 2): Pt will be able to don shirt with min A. OT Short Term Goal 2 - Progress (Week 2): Not met OT Short Term Goal 3 (Week 2): Pt will be able to bathe with min A. OT Short Term Goal 3 - Progress (Week 2): Not met OT Short Term Goal 4 (Week 2): Pt will be able to sit to stand during LB self care with min A. OT Short Term Goal 4 - Progress (Week 2): Not met OT Short Term Goal 5 (Week 2): Pt will be able to maintain stand balance with mod A while pulling pants up and down with 25% or less A. OT Short Term Goal 5 - Progress (Week 2): Met Week 3:  OT Short Term Goal 1 (Week 3): Pt will perform toilet transfer with mod A overall OT Short Term Goal 2 (Week 3): Pt will maintain dynamic standing balance with mod A for LB clothing management. OT Short Term Goal 3 (Week 3): Pt will visually scan to the L for self care items with min cuing.  Skilled Therapeutic Interventions/Progress Updates:    patient in bed, alert and ready for therapy session.  LB bathing and dressing bed level with max A, supine to SSP with mod A, sit to stand at edge of bed for clothing management with max A.  Sit pivot transfer bed to w/c max a.  UB bathing and dressing seated in w/c mod a, cues for shirt orientation.  Oral care with set up.  stedy to transport to toilet with CGA for sit to stand, max A hygiene and pants up/down.  Completed left UE motor control activities in seated position with focus on posture, inhibition of flexors/IR and facilitation of scapula - improved  positioning noted with arm at side on half lap tray.  She is seated in w/c with seat belt alarm set and call bell in reach.    Therapy Documentation Precautions:  Precautions Precautions: Fall Precaution Comments: L side weak/flaccid, right gaze preference, L shoulder pain (risk for subluxation), pressure injury from laying on floor PTA Restrictions Weight Bearing Restrictions: No General:   Vital Signs:  Pain: Pain Assessment Pain Scale: 0-10 Pain Score: 0-No pain   Other Treatments:     Therapy/Group: Individual Therapy  Carlos Levering 10/20/2018, 12:04 PM

## 2018-10-20 NOTE — Progress Notes (Signed)
[facility] PHYSICAL MEDICINE & REHABILITATION PROGRESS NOTE   Subjective/Complaints: Had a better night. Able to sleep. Bladder less active. No PVR's recorded   ROS: Patient denies fever, rash, sore throat, blurred vision, nausea, vomiting, diarrhea, cough, shortness of breath or chest pain, joint or back pain, headache, or mood change.   Objective:   No results found. No results for input(s): WBC, HGB, HCT, PLT in the last 72 hours. Recent Labs    10/19/18 0851  NA 141  K 4.7  CL 109  CO2 22  GLUCOSE 172*  BUN 17  CREATININE 1.01*  CALCIUM 9.9    Intake/Output Summary (Last 24 hours) at 10/20/2018 0929 Last data filed at 10/20/2018 0844 Gross per 24 hour  Intake 508 ml  Output 200 ml  Net 308 ml     Physical Exam: Vital Signs Blood pressure 119/71, pulse (!) 52, temperature 98.3 F (36.8 C), temperature source Oral, resp. rate 15, height 5\' 3"  (1.6 m), weight 72.1 kg, SpO2 100 %. Constitutional: No distress . Vital signs reviewed. HEENT: EOMI, oral membranes moist Neck: supple Cardiovascular: RRR without murmur. No JVD    Respiratory: CTA Bilaterally without wheezes or rales. Normal effort    GI: BS +, non-tender, non-distended  Musc: No edema or tenderness in extremities Neurologic: Alert Motor: Grossly 5/5 RUE/RLE LUE: Grossly 1/5 proximal distal LLE: Grossly 2-/5 proximal distal Tone 2/4 LUE and LLE Psych: Normal mood.  Normal affect.  Assessment/Plan: 1. Functional deficits secondary to right MCA infarct which require 3+ hours per day of interdisciplinary therapy in a comprehensive inpatient rehab setting.  Physiatrist is providing close team supervision and 24 hour management of active medical problems listed below.  Physiatrist and rehab team continue to assess barriers to discharge/monitor patient progress toward functional and medical goals  Care Tool:  Bathing    Body parts bathed by patient: Left arm, Abdomen, Chest, Front perineal area, Left  upper leg, Right upper leg, Face   Body parts bathed by helper: Right arm, Buttocks, Right lower leg, Left lower leg     Bathing assist Assist Level: Moderate Assistance - Patient 50 - 74%     Upper Body Dressing/Undressing Upper body dressing   What is the patient wearing?: Pull over shirt    Upper body assist Assist Level: Moderate Assistance - Patient 50 - 74%    Lower Body Dressing/Undressing Lower body dressing      What is the patient wearing?: Pants, Incontinence brief     Lower body assist Assist for lower body dressing: Maximal Assistance - Patient 25 - 49%     Toileting Toileting    Toileting assist Assist for toileting: Total Assistance - Patient < 25%     Transfers Chair/bed transfer  Transfers assist  Chair/bed transfer activity did not occur: Safety/medical concerns  Chair/bed transfer assist level: Maximal Assistance - Patient 25 - 49%     Locomotion Ambulation   Ambulation assist   Ambulation activity did not occur: Safety/medical concerns  Assist level: Moderate Assistance - Patient 50 - 74% Assistive device: Walker-rolling Max distance: 10   Walk 10 feet activity   Assist  Walk 10 feet activity did not occur: Safety/medical concerns  Assist level: Moderate Assistance - Patient - 50 - 74% Assistive device: Walker-rolling   Walk 50 feet activity   Assist Walk 50 feet with 2 turns activity did not occur: Safety/medical concerns         Walk 150 feet activity   Assist Walk  150 feet activity did not occur: Safety/medical concerns         Walk 10 feet on uneven surface  activity   Assist Walk 10 feet on uneven surfaces activity did not occur: Safety/medical concerns         Wheelchair     Assist Will patient use wheelchair at discharge?: Yes Type of Wheelchair: Manual    Wheelchair assist level: Supervision/Verbal cueing Max wheelchair distance: 150    Wheelchair 50 feet with 2 turns  activity    Assist        Assist Level: Supervision/Verbal cueing   Wheelchair 150 feet activity     Assist Wheelchair 150 feet activity did not occur: Safety/medical concerns   Assist Level: Supervision/Verbal cueing    Medical Problem List and Plan: 1.Lefthemiparesis, dysphagia, and visual-spatial deficitssecondary to right MCA infarction, now with spasticity  Continue CIR  Baclofen  increased to 10mg  bid, increase to TID tomorrow potentially  Continue WHO/PRAFO   2. Antithrombotics: -DVT/anticoagulation:Continue Eliquis -antiplatelet therapy: N/A 3. Pain Management:Tylenol as needed- added topamax for HA with some benefit increased to BID-improving -maintain proper shoulder position and support when in bed and chair.    - Voltaren gel-  -added tramadol 50mg  q12 prn for severe pain 4. Mood:Provide emotional support -antipsychotic agents: N/A  -neuro-psych input would be helpful  -may have some disinhibition related to location of CVA  -?reactive anxiety/depression also may be at play 5. Neuropsych: This patientiscapable of making decisions on herown behalf. 6. Skin/Wound Care:Routine skin checks. Aquacel, gen surg evaled there is no evidence of infection but some necrotic tissue noted.  May benefit from hydro therapy  7. Fluids/Electrolytes/Nutrition:Routine in and outs 8.Atrial fibrillation. Amiodarone 200 mg twice daily, Lanoxin 0.125 mg daily, Cardizem 90 mg 4 times daily. Cardiac rate controlled. Follow up cardiology services Vitals:   10/19/18 2034 10/20/18 0601  BP: 123/67 119/71  Pulse: 65 (!) 52  Resp: 18 15  Temp: (!) 97.4 F (36.3 C) 98.3 F (36.8 C)  SpO2: 99% 100%   HR controlled8/5 9.  Post strokedysphagia. Dysphagia #2, thin liquids  Advance diet per SLP 10. Diabetes mellitus. Hemoglobin A1c 6.1. SSI. Patient on Glucophage 500 mg twice daily prior to admission. Resume as  indicated  Reasonable control 8/5 11.Rhabdomyolysis. no sign of renal failure  12. Hyperlipidemia. Lipitor 13. Constipation. Laxative assistance 14.  Leukocytosis: Resolved  not febrile, ua equivocal, ucx multispecies  WBCs 10.1 on 7/24  Recheck Thursday 15.  Hypoalbuminemia  Supplement initiated 16.  AKI  Creatinine 1.01 on 8.4   I personally reviewed the patient's labs today.   17. Urinary frequency: had some at baseline  -need to check PVR's---none were checked yesterday  -increased ditropan to 5mg  qhs  -urine clear, no odor, won't re-check UA/UCx yet  LOS: 23 days A FACE TO FACE EVALUATION WAS PERFORMED  Meredith Staggers 10/20/2018, 9:29 AM

## 2018-10-20 NOTE — Progress Notes (Signed)
Occupational Therapy Session Note  Patient Details  Name: TNIYA BOWDITCH MRN: [id number] Date of Birth: 1945/10/23  Today's Date: 10/20/2018 OT Individual Time: 1520-1535 OT Individual Time Calculation (min): 15 min  and Today's Date: 10/20/2018 OT Missed Time: 30 Minutes Missed Time Reason: Patient fatigue   Short Term Goals: Week 3:  OT Short Term Goal 1 (Week 3): Pt will perform toilet transfer with mod A overall OT Short Term Goal 2 (Week 3): Pt will maintain dynamic standing balance with mod A for LB clothing management. OT Short Term Goal 3 (Week 3): Pt will visually scan to the L for self care items with min cuing.  Skilled Therapeutic Interventions/Progress Updates:    Upon entering the room, pt supine in bed and sleeping soundly. OT attempting to alert pt who opens eyes once but then returns to sleeping. OT unable to fully alert pt for active participation of session. Bed alarm activated and call bell within reach. RN notified.   Therapy Documentation Precautions:  Precautions Precautions: Fall Precaution Comments: L side weak/flaccid, right gaze preference, L shoulder pain (risk for subluxation), pressure injury from laying on floor PTA Restrictions Weight Bearing Restrictions: No General: General OT Amount of Missed Time: 30 Minutes Vital Signs: Therapy Vitals Temp: 97.6 F (36.4 C) Temp Source: Oral Pulse Rate: 63 Resp: 16 BP: 114/68 Patient Position (if appropriate): Lying Oxygen Therapy SpO2: 100 % O2 Device: Room Air   Therapy/Group: Individual Therapy  Gypsy Decant 10/20/2018, 4:09 PM

## 2018-10-20 NOTE — Progress Notes (Signed)
Speech Language Pathology Weekly Progress and Session Note  Patient Details  Name: Rachel Fox MRN: [id number] Date of Birth: Aug 15, 1945  Beginning of progress report period: October 13, 2018 End of progress report period: October 20, 2018  Today's Date: 10/20/2018 SLP Individual Time: 1030-1055 SLP Individual Time Calculation (min): 25 min  Short Term Goals: Week 3: SLP Short Term Goal 1 (Week 3): Patient will consume Dys. 2 textures and thin liquids via cup with efficient mastication and complete oral clearance without overt s/s of aspiration with Supervision verbal cues for use of swallowing compensatory strategies. SLP Short Term Goal 1 - Progress (Week 3): Met SLP Short Term Goal 2 (Week 3): Patient will utilize speech intelligibility strategies at the sentence level with Min A verbal cues to achieve ~70% intelligibility. SLP Short Term Goal 2 - Progress (Week 3): Not met SLP Short Term Goal 3 (Week 3): Patient will demonstrate problem solving in semi-complex tasks with Min A verbal cues. SLP Short Term Goal 3 - Progress (Week 3): Met SLP Short Term Goal 4 (Week 3): Patient will self-monitor and correct errors during semi-complex problem solving tasks with Min verbal and visual cues. SLP Short Term Goal 4 - Progress (Week 3): Met SLP Short Term Goal 5 (Week 3): Patient will utilize memory compensatory strategies to recall new, daily information with supervision A verbal cues. SLP Short Term Goal 5 - Progress (Week 3): Not met    New Short Term Goals: Week 4: SLP Short Term Goal 1 (Week 4): Patient will demonstate efficient mastication and complete oral clearance with trials of dys. 3 textures with Min A verbal cues for utilization of compensatory strategies. SLP Short Term Goal 2 (Week 4): Patient will utilize speech intelligibility strategies at the sentence level with Min A verbal cues to achieve ~70% intelligibility. SLP Short Term Goal 3 (Week 4): Patient will demonstrate problem  solving in semi-complex tasks with Supervision verbal cues. SLP Short Term Goal 4 (Week 4): Patient will self-monitor and correct errors during semi-complex problem solving tasks with Supervision verbal and visual cues. SLP Short Term Goal 5 (Week 4): Patient will utilize memory compensatory strategies to recall new, daily information with supervision A verbal cues.  Weekly Progress Updates: Patient has made functional gains and has met 3 of 5 STGs this reporting period. Currently, patient is consuming Dys. 2 textures with thin liquids with minimal overt s/s of aspiration but continues to require Mod A verbal cues for use of swallowing compensatory strategies with trials of Dys. 3 textures in regards to oral clearing, therefore, patient is not ready to upgrade at this time. Patient also demonstrates decreased awareness of impaired speech intelligibility and requires overall Mod A verbal cues for use of speech intelligibility strategies at the phrase and sentence level. Patient has made improvements in problem solving and error awareness with tasks requiring only Min A verbal cues. Patient and family education ongoing. Patient would benefit from continued skilled SLP intervention to maximize her cognitive and swallowing function as well as her speech intelligibility prior to discharge.     Intensity: Minumum of 1-2 x/day, 30 to 90 minutes Frequency: 3 to 5 out of 7 days Duration/Length of Stay: 8/14 Treatment/Interventions: Cognitive remediation/compensation;Dysphagia/aspiration precaution training;Internal/external aids;Speech/Language facilitation;Therapeutic Activities;Environmental controls;Cueing hierarchy;Functional tasks;Patient/family education   Daily Session  Skilled Therapeutic Interventions: Skilled treatment session focused on cognitive and speech goals. SLP facilitated session by providing Mod A verbal cues for anticipatory awareness in regards to d/c planning and generating a list of  tasks she can complete at home safely. However, patient able to recall goals of each discipline with Min verbal cues. Patient initially was ~90% intelligible at the sentence level, however, as the session progressed, patient became more fatigued requiring overall Max A verbal cues for use of speech intelligibility strategies. Throughout conversation, patient made eye contact with clinician on her left side of the environment in 25% of opportunities. Patient left upright in wheelchair with alarm on and all needs within reach. Continue with current plan of care.      Pain No/Denies Pain   Therapy/Group: Individual Therapy  [place] Monds 10/20/2018, 6:44 AM

## 2018-10-20 NOTE — Patient Care Conference (Signed)
Inpatient RehabilitationTeam Conference and Plan of Care Update Date: 10/20/2018   Time: 10:45 AM    Patient Name: Rachel Fox      Medical Record Number: [id number]  Date of Birth: [birth year] Sex: Female         Room/Bed: 4W12C/4W12C-01 Payor Info: Payor: MEDICARE / Plan: MEDICARE PART A AND B / Product Type: *No Product type* /    Admitting Diagnosis: 3. CVA 1 Team  RT. CVA; 22-24days  Admit Date/Time:  09/27/2018  4:04 PM Admission Comments: No comment available   Primary Diagnosis:  <principal problem not specified> Principal Problem: <principal problem not specified>  Patient Active Problem List   Diagnosis Date Noted  . Spastic hemiparesis ([place])   . Labile blood glucose   . Acute pain of left shoulder   . AKI (acute kidney injury) ([place])   . Controlled type 2 diabetes mellitus with hyperglycemia, without long-term current use of insulin ([place])   . Dysphagia, post-stroke   . Atrial fibrillation ([place])   . Right middle cerebral artery stroke ([place]) 09/27/2018  . Pressure injury of skin 09/21/2018  . Cerebrovascular accident (CVA) with involvement of left side of body ([place]) 09/20/2018  . Atrial fibrillation with RVR ([place]) 09/20/2018  . Hypertension   . Rhabdomyolysis   . Type 2 diabetes mellitus ([place])   . Hyperlipidemia   . Hyperbilirubinemia   . Leukocytosis   . Hypernatremia     Expected Discharge Date: Expected Discharge Date: 10/29/18  Team Members Present: Physician leading conference: Dr. Delice Lesch Social Worker Present: Alfonse Alpers, LCSW;[place] Zirkelbach Donata Clay, LCSW;Becky DuPree, LCSW Nurse Present: Dorthula Nettles, RN PT Present: Phylliss Bob, PTA;Barrie Folk, PT OT Present: Darleen Crocker, OT SLP Present: Stormy Fabian, SLP PPS Coordinator present : Gunnar Fusi, SLP     Current Status/Progress Goal Weekly Team Focus  Medical   right MCA infarct with spastic left hemiparesis, afib, htn, urinary frequency, coping skills and pain have been problems at times  as well  improve ROM LUE and LLE  spasticity mgt, bladder emptying, pain control, stroke ed   Bowel/Bladder   con of B/B with urine freq.  To remain con of b/b      Swallow/Nutrition/ Hydration   Dys. 2 textures with thin liquids, Min A  Supervision A  trials of upgraded textures, use of swallowing strategies   ADL's   mod A bathing w/c level at sink, mod A UB self care, max A LB self care, total A for toileting, max A squat pivot transfer  min A overall  NMR,L inattention, self care retraining, functional transfers, cognition, and family education   Mobility   heavy mod to maxA bed mobilty, inconsistent mod to maxA STS transfer with RW, modA gait 10-23ft  Min-mod assist  transfers, gait, w/c mobilty, bed mobiltiy   Communication   Mod A  Min A at sentence level  use of speech intelligibility strategies, awareness   Safety/Cognition/ Behavioral Observations  Min A  Min A  semi-complex problem solving, recall and awareness   Pain   c/o mild pain  To control pain with prn medication and muscle rub.  Keep pain well control with medication.   Skin   stage 1 pressure wound to the left lateral heel.unstageable pressure wound to the left buttocks.  To prevent any further infection or breakdown.  Change the dressings daily and prn.    Rehab Goals Patient on target to meet rehab goals: Yes Rehab Goals Revised: some speech goals were downgraded. *See  Care Plan and progress notes for long and short-term goals.     Barriers to Discharge  Current Status/Progress Possible Resolutions Date Resolved   Physician    Medical stability        see medical problem list      Nursing                  PT                    OT                  SLP                SW                Discharge Planning/Teaching Needs:  Pt to return to her home with her cousin and son to provide 24/7 assistance.  Niece to also come for [place] to assist.  Pt's family can come in closer to d/c for training.   Team  Discussion:  BP meds adusted;  Pain; urinary freq - checking PVRs.  Limited progress:  Mod - max assist overall with OT.  Left inattention;  Increased tone with arm over the past few days.  Mod - max with PT on bed mobility and tfs.  Working on standing and wt shiftin.  No progress with ST;  No awareness of deficits.  Ready to begin family ed vs consideration of SNF.  Revisions to Treatment Plan:  NA    Continued Need for Acute Rehabilitation Level of Care: The patient requires daily medical management by a physician with specialized training in physical medicine and rehabilitation for the following conditions: Daily direction of a multidisciplinary physical rehabilitation program to ensure safe treatment while eliciting the highest outcome that is of practical value to the patient.: Yes Daily medical management of patient stability for increased activity during participation in an intensive rehabilitation regime.: Yes Daily analysis of laboratory values and/or radiology reports with any subsequent need for medication adjustment of medical intervention for : Neurological problems;Blood pressure problems;Urological problems   I attest that I was present, lead the team conference, and concur with the assessment and plan of the team.   Alonza BogusHOYLE, [place] Yearby 10/20/2018, 3:12 PM    Team conference was held via web/ teleconference due to COVID - 19

## 2018-10-21 ENCOUNTER — Inpatient Hospital Stay (HOSPITAL_COMMUNITY): Payer: Medicare Other | Admitting: Speech Pathology

## 2018-10-21 ENCOUNTER — Inpatient Hospital Stay (HOSPITAL_COMMUNITY): Payer: Medicare Other | Admitting: Physical Therapy

## 2018-10-21 ENCOUNTER — Inpatient Hospital Stay (HOSPITAL_COMMUNITY): Payer: Medicare Other | Admitting: Occupational Therapy

## 2018-10-21 LAB — CBC
HCT: 48.7 % — ABNORMAL HIGH (ref 36.0–46.0)
Hemoglobin: 15.2 g/dL — ABNORMAL HIGH (ref 12.0–15.0)
MCH: 27.5 pg (ref 26.0–34.0)
MCHC: 31.2 g/dL (ref 30.0–36.0)
MCV: 88.2 fL (ref 80.0–100.0)
Platelets: 210 10*3/uL (ref 150–400)
RBC: 5.52 MIL/uL — ABNORMAL HIGH (ref 3.87–5.11)
RDW: 14.9 % (ref 11.5–15.5)
WBC: 6.1 10*3/uL (ref 4.0–10.5)
nRBC: 0 % (ref 0.0–0.2)

## 2018-10-21 LAB — URINALYSIS, COMPLETE (UACMP) WITH MICROSCOPIC
Bilirubin Urine: NEGATIVE
Glucose, UA: NEGATIVE mg/dL
Hgb urine dipstick: NEGATIVE
Ketones, ur: NEGATIVE mg/dL
Leukocytes,Ua: NEGATIVE
Nitrite: NEGATIVE
Protein, ur: NEGATIVE mg/dL
Specific Gravity, Urine: 1.013 (ref 1.005–1.030)
pH: 7 (ref 5.0–8.0)

## 2018-10-21 LAB — GLUCOSE, CAPILLARY
Glucose-Capillary: 114 mg/dL — ABNORMAL HIGH (ref 70–99)
Glucose-Capillary: 102 mg/dL — ABNORMAL HIGH (ref 70–99)
Glucose-Capillary: 102 mg/dL — ABNORMAL HIGH (ref 70–99)
Glucose-Capillary: 114 mg/dL — ABNORMAL HIGH (ref 70–99)

## 2018-10-21 MED ORDER — BACLOFEN 10 MG PO TABS
10.0000 mg | ORAL_TABLET | Freq: Three times a day (TID) | ORAL | Status: DC
  Administered 2018-10-21 – 2018-11-01 (×33): 10 mg via ORAL
  Filled 2018-10-21 (×32): qty 1

## 2018-10-21 MED ORDER — OXYBUTYNIN CHLORIDE 5 MG PO TABS
5.0000 mg | ORAL_TABLET | Freq: Two times a day (BID) | ORAL | Status: DC
  Administered 2018-10-21 – 2018-10-24 (×7): 5 mg via ORAL
  Filled 2018-10-21 (×7): qty 1

## 2018-10-21 NOTE — Progress Notes (Signed)
Social Work Patient ID: Rachel Fox, female   DOB: Feb 18, 1946, 73 y.o.   MRN: [id number]   CSW met with pt and called her niece in [place] today to update them on team conference discussion.  Explained to niece that pt's progress has slowed and that therapists would need to downgrade goals. Asked niece for family to come in for education to see if they can meet pt's needs at mod A for d/c to home.  Pt's cousin is a CNA and she will be main caregiver for pt, so niece is hopeful she can manage her at home, but they will consider SNF, if need be.  Niece to come from [place] next week for family education 10-28-18 and then CSW will either assist with d/c to home or start SNF process.  Niece wishes to be the one to discuss d/c plan with pt for now.  CSW will continue to follow and assist as needed.

## 2018-10-21 NOTE — Progress Notes (Signed)
Speech Language Pathology Daily Session Note  Patient Details  Name: Rachel Fox MRN: [id number] Date of Birth: September 18, 1945  Today's Date: 10/21/2018 SLP Individual Time: 3664-4034 SLP Individual Time Calculation (min): 40 min  Short Term Goals: Week 4: SLP Short Term Goal 1 (Week 4): Patient will demonstate efficient mastication and complete oral clearance with trials of dys. 3 textures with Min A verbal cues for utilization of compensatory strategies. SLP Short Term Goal 2 (Week 4): Patient will utilize speech intelligibility strategies at the sentence level with Min A verbal cues to achieve ~70% intelligibility. SLP Short Term Goal 3 (Week 4): Patient will demonstrate problem solving in semi-complex tasks with Supervision verbal cues. SLP Short Term Goal 4 (Week 4): Patient will self-monitor and correct errors during semi-complex problem solving tasks with Supervision verbal and visual cues. SLP Short Term Goal 5 (Week 4): Patient will utilize memory compensatory strategies to recall new, daily information with supervision A verbal cues.  Skilled Therapeutic Interventions: Skilled treatment session focused on cognitive goals. SLP facilitated session by providing supervision level verbal cues for patient to self-monitor and correct errors during a basic calendar task but required Mod verbal cues for recall of events from previous therapy session. Patient was continent of urine and required supervision level verbal cues for safety with task. Patient became emotional when talking about her stroke and current deficits but also thankful to "still be alive." During conversation, patient was 100% intelligible with approproate vocal intensity. Patient left upright in wheelchair with alarm on and all needs within reach. Continue with current plan of care.      Pain No/Denies Pain   Therapy/Group: Individual Therapy  Carolee Channell 10/21/2018, 12:42 PM

## 2018-10-21 NOTE — Progress Notes (Signed)
[facility] PHYSICAL MEDICINE & REHABILITATION PROGRESS NOTE   Subjective/Complaints: Had a better night. Able to sleep. Bladder less active. No PVR's recorded   ROS: Patient denies fever, rash, sore throat, blurred vision, nausea, vomiting, diarrhea, cough, shortness of breath or chest pain, joint or back pain, headache, or mood change.   Objective:   No results found. Recent Labs    10/21/18 0607  WBC 6.1  HGB 15.2*  HCT 48.7*  PLT 210   Recent Labs    10/19/18 0851  NA 141  K 4.7  CL 109  CO2 22  GLUCOSE 172*  BUN 17  CREATININE 1.01*  CALCIUM 9.9    Intake/Output Summary (Last 24 hours) at 10/21/2018 1136 Last data filed at 10/21/2018 0900 Gross per 24 hour  Intake 490 ml  Output 350 ml  Net 140 ml     Physical Exam: Vital Signs Blood pressure 117/60, pulse (!) 57, temperature 98.2 F (36.8 C), resp. rate 18, height 5\' 3"  (1.6 m), weight 72.1 kg, SpO2 98 %. Constitutional: No distress . Vital signs reviewed. HEENT: EOMI, oral membranes moist Neck: supple Cardiovascular: RRR without murmur. No JVD    Respiratory: CTA Bilaterally without wheezes or rales. Normal effort    GI: BS +, non-tender, non-distended  Musc: No edema or tenderness in extremities Neurologic: Alert Motor: Grossly 5/5 RUE/RLE LUE: Grossly 1/5 proximal distal LLE: Grossly 2-/5 proximal distal Tone 2/4 LUE and LLE Psych: Normal mood.  Normal affect.  Assessment/Plan: 1. Functional deficits secondary to right MCA infarct which require 3+ hours per day of interdisciplinary therapy in a comprehensive inpatient rehab setting.  Physiatrist is providing close team supervision and 24 hour management of active medical problems listed below.  Physiatrist and rehab team continue to assess barriers to discharge/monitor patient progress toward functional and medical goals  Care Tool:  Bathing    Body parts bathed by patient: Left arm, Abdomen, Chest, Front perineal area, Left upper leg, Right  upper leg, Face   Body parts bathed by helper: Right arm, Buttocks, Right lower leg, Left lower leg     Bathing assist Assist Level: Moderate Assistance - Patient 50 - 74%     Upper Body Dressing/Undressing Upper body dressing   What is the patient wearing?: Pull over shirt    Upper body assist Assist Level: Moderate Assistance - Patient 50 - 74%    Lower Body Dressing/Undressing Lower body dressing      What is the patient wearing?: Pants, Incontinence brief     Lower body assist Assist for lower body dressing: Maximal Assistance - Patient 25 - 49%     Toileting Toileting    Toileting assist Assist for toileting: Total Assistance - Patient < 25%     Transfers Chair/bed transfer  Transfers assist  Chair/bed transfer activity did not occur: Safety/medical concerns  Chair/bed transfer assist level: Moderate Assistance - Patient 50 - 74%     Locomotion Ambulation   Ambulation assist   Ambulation activity did not occur: Safety/medical concerns  Assist level: Moderate Assistance - Patient 50 - 74% Assistive device: Walker-rolling Max distance: 10   Walk 10 feet activity   Assist  Walk 10 feet activity did not occur: Safety/medical concerns  Assist level: Moderate Assistance - Patient - 50 - 74% Assistive device: Walker-rolling   Walk 50 feet activity   Assist Walk 50 feet with 2 turns activity did not occur: Safety/medical concerns         Walk 150 feet  activity   Assist Walk 150 feet activity did not occur: Safety/medical concerns         Walk 10 feet on uneven surface  activity   Assist Walk 10 feet on uneven surfaces activity did not occur: Safety/medical concerns         Wheelchair     Assist Will patient use wheelchair at discharge?: Yes Type of Wheelchair: Manual    Wheelchair assist level: Supervision/Verbal cueing Max wheelchair distance: 150    Wheelchair 50 feet with 2 turns activity    Assist         Assist Level: Supervision/Verbal cueing   Wheelchair 150 feet activity     Assist Wheelchair 150 feet activity did not occur: Safety/medical concerns   Assist Level: Supervision/Verbal cueing    Medical Problem List and Plan: 1.Lefthemiparesis, dysphagia, and visual-spatial deficitssecondary to right MCA infarction, now with spasticity  Continue CIR  Baclofen  Increase to TID  Continue WHO/PRAFO   2. Antithrombotics: -DVT/anticoagulation:Continue Eliquis -antiplatelet therapy: N/A 3. Pain Management:Tylenol as needed- added topamax for HA with some benefit increased to BID-improving -maintain proper shoulder position and support when in bed and chair.    - Voltaren gel-  -added tramadol 50mg  q12 prn for severe pain 4. Mood:Provide emotional support -antipsychotic agents: N/A  -neuro-psych input would be helpful  -may have some disinhibition related to location of CVA  -?reactive anxiety/depression also an issue 5. Neuropsych: This patientiscapable of making decisions on herown behalf. 6. Skin/Wound Care:Routine skin checks. Aquacel, gen surg evaled there is no evidence of infection but some necrotic tissue noted.  May benefit from hydro therapy  7. Fluids/Electrolytes/Nutrition:Routine in and outs 8.Atrial fibrillation. Amiodarone 200 mg twice daily, Lanoxin 0.125 mg daily, Cardizem 90 mg 4 times daily. Cardiac rate controlled. Follow up cardiology services Vitals:   10/20/18 1957 10/21/18 0442  BP: 119/72 117/60  Pulse: (!) 52 (!) 57  Resp: 14 18  Temp: (!) 97.5 F (36.4 C) 98.2 F (36.8 C)  SpO2: 100% 98%   HR controlled8/6, watch for excessive bradycardia 9.  Post strokedysphagia. Dysphagia #2, thin liquids  Advance diet per SLP 10. Diabetes mellitus. Hemoglobin A1c 6.1. SSI. Patient on Glucophage 500 mg twice daily prior to admission. Resume as indicated  Reasonable control  8/6 11.Rhabdomyolysis. no sign of renal failure  12. Hyperlipidemia. Lipitor 13. Constipation. Laxative assistance 14.  Leukocytosis: Resolved  not febrile, ua equivocal, ucx multispecies  WBCs 6.1 today    15.  Hypoalbuminemia  Supplement initiated 16.  AKI  Creatinine 1.01 on 8.4   I personally reviewed the patient's labs today.   17. Urinary frequency: had some at baseline  -PVR's low  -increase ditropan to 5mg  bid  -re-check UA  LOS: 24 days A FACE TO FACE EVALUATION WAS PERFORMED  Ranelle OysterZachary T Swartz 10/21/2018, 11:36 AM

## 2018-10-21 NOTE — Progress Notes (Signed)
Occupational Therapy Weekly Progress Note  Patient Details  Name: Rachel Fox MRN: [id number] Date of Birth: August 30, 1945  Beginning of progress report period: October 13, 2018 End of progress report period: October 21, 2018  Today's Date: 10/21/2018 OT Individual Time: 9233-0076 OT Individual Time Calculation (min): 75 min    Patient has met 1 of 3 short term goals. Pt making minimal progress this week towards occupational therapy goals. Pt with increased pain and tone in L UE making it painful for therapist to passively move and place into weight bearing positions. Pt also appearing very fatigued and requiring increased motivation for OOB activities. Pt also fluctuating with mod - total A for functional transfers this week. Pt needing total A for toileting tasks and max A for bathing and LB self care. Pt needing mod A for UB self care and mod multimodal cuing to attend to L side.  Patient continues to demonstrate the following deficits: muscle weakness, decreased cardiorespiratoy endurance, abnormal tone, decreased coordination and decreased motor planning, field cut, decreased initiation, decreased attention, decreased awareness, decreased problem solving and decreased safety awareness and decreased sitting balance, decreased standing balance, decreased postural control, hemiplegia and decreased balance strategies and therefore will continue to benefit from skilled OT intervention to enhance overall performance with BADL and Reduce care partner burden.  Patient progressing toward long term goals..  Continue plan of care.  OT Short Term Goals Week 3:  OT Short Term Goal 1 (Week 3): Pt will perform toilet transfer with mod A overall OT Short Term Goal 1 - Progress (Week 3): Not met OT Short Term Goal 2 (Week 3): Pt will maintain dynamic standing balance with mod A for LB clothing management. OT Short Term Goal 2 - Progress (Week 3): Met OT Short Term Goal 3 (Week 3): Pt will visually scan to the  L for self care items with min cuing. OT Short Term Goal 3 - Progress (Week 3): Not met Week 4:  OT Short Term Goal 1 (Week 4): STGs=LTGs secondary to upcoming discharge  Skilled Therapeutic Interventions/Progress Updates:    Upon entering the room, pt supine in bed but agreeable to OT intervention. Pt needing mod A for supine >sit to EOB. OT utilized stedy for transfer to toilet and then onto TTB. Pt needing total A for hygiene and clothing management. Pt seated on TTB with min -mod A sitting balance with mod cuing and max facilitation for midline orientation. Pt needing max A for bathing while seated. RN arrived and pt able to stand in stedy for 8 minutes with min A for balance during dressing change to buttocks. Pt returning to wheelchair with focus on hemiplegic dressing technique. Self care items placed to the L side for visual scanning.  Pt remained in wheelchair at end of session with L UE lap tray donned and chair belt alarm activated. Call bell within reach.   Therapy Documentation Precautions:  Precautions Precautions: Fall Precaution Comments: L side weak/flaccid, right gaze preference, L shoulder pain (risk for subluxation), pressure injury from laying on floor PTA Restrictions Weight Bearing Restrictions: No General:   Vital Signs: Therapy Vitals Temp: 98 F (36.7 C) Temp Source: Oral Pulse Rate: (!) 57 Resp: 17 BP: 126/67 Patient Position (if appropriate): Lying Oxygen Therapy SpO2: 100 % O2 Device: Room Air Pain: Pain Assessment Pain Scale: 0-10 Pain Score: 2  Faces Pain Scale: Hurts a little bit Pain Type: Acute pain Pain Location: Arm Pain Orientation: Left Pain Descriptors / Indicators: Cramping;Pins and  needles Pain Intervention(s): Medication (See eMAR) Multiple Pain Sites: No   Therapy/Group: Individual Therapy  Gypsy Decant 10/21/2018, 5:11 PM

## 2018-10-21 NOTE — Progress Notes (Signed)
Physical Therapy Session Note  Patient Details  Name: [name] MRN: [id number] Date of Birth: Jul 10, 1945  Today's Date: 10/21/2018 PT Individual Time: 1300-1415 PT Individual Time Calculation (min): 75 min   Short Term Goals: Week 3:  PT Short Term Goal 1 (Week 3): Pt will perform bed mobility with min assist PT Short Term Goal 2 (Week 3): Pt will ambulate 21f with mod assist and LRAD PT Short Term Goal 3 (Week 3): Pt will propell WC 1053fwith hemi technique and CGA PT Short Term Goal 4 (Week 3): Pt will transfer to and from bed with mod assist consistently  Skilled Therapeutic Interventions/Progress Updates:   Pt received sitting in WC and agreeable to PT. [facility] use of [facility] with HW  And RW with L hand orthotic x 159fach and additional gait training with RW x [id number] mod-max assist for midline orientation, R weight shifting, and LLE advancement. Multimodal cues including visual feedback from mirror to improve awareness of LOB to the L and LLE activation to correct to midline. Inconsistent ability to orient to midline due to decreased attention and apraxia. PT instructed pt in blocked practice SB transfers to the R and L with mod-min assist and mod-max cues for setup, sequencing, weight shifting and improved midline orientation. Sitting balance to preform R lateral weight with WB in the LUE to facilitate improve L sided trunk control and R weight shift. Standing R reaching to target with hold at end range with RUE on RW and mod assist from PT to improve terminal knee extension on the L. WC mobility with hemi tehcnique 2 x [id number] min-mod assist depending on pt's attention to task. Pt returned to room and performed SB transfer to bed with min A and max cues for sequencing on the L. Sit>supine completed with mod assist, and left supine in bed with call bell in reach and all needs met.        Therapy  Documentation Precautions:  Precautions Precautions: Fall Precaution Comments: L side weak/flaccid, right gaze preference, L shoulder pain (risk for subluxation), pressure injury from laying on floor PTA Restrictions Weight Bearing Restrictions: No    Vital Signs: Therapy Vitals Temp: 98 F (36.7 C) Temp Source: Oral Pulse Rate: (!) 57 Resp: 17 BP: 126/67 Patient Position (if appropriate): Lying Oxygen Therapy SpO2: 100 % O2 Device: Room Air Pain: Pain Assessment Pain Scale: 0-10 Pain Score: 2  Faces Pain Scale: Hurts a little bit Pain Type: Acute pain Pain Location: Arm Pain Orientation: Left Pain Descriptors / Indicators: Cramping;Pins and needles Pain Intervention(s): Medication (See eMAR) Multiple Pain Sites: No   Therapy/Group: Individual Therapy  AustiLorie Phenix2020, 5:51 PM

## 2018-10-22 ENCOUNTER — Inpatient Hospital Stay (HOSPITAL_COMMUNITY): Payer: Medicare Other | Admitting: Speech Pathology

## 2018-10-22 ENCOUNTER — Inpatient Hospital Stay (HOSPITAL_COMMUNITY): Payer: Medicare Other | Admitting: Occupational Therapy

## 2018-10-22 ENCOUNTER — Inpatient Hospital Stay (HOSPITAL_COMMUNITY): Payer: Medicare Other | Admitting: Physical Therapy

## 2018-10-22 LAB — GLUCOSE, CAPILLARY
Glucose-Capillary: 120 mg/dL — ABNORMAL HIGH (ref 70–99)
Glucose-Capillary: 111 mg/dL — ABNORMAL HIGH (ref 70–99)
Glucose-Capillary: 156 mg/dL — ABNORMAL HIGH (ref 70–99)
Glucose-Capillary: 127 mg/dL — ABNORMAL HIGH (ref 70–99)

## 2018-10-22 MED ORDER — PHENAZOPYRIDINE HCL 100 MG PO TABS
100.0000 mg | ORAL_TABLET | Freq: Three times a day (TID) | ORAL | Status: AC
  Administered 2018-10-22 – 2018-10-25 (×9): 100 mg via ORAL
  Filled 2018-10-22 (×9): qty 1

## 2018-10-22 MED ORDER — ALPRAZOLAM 0.25 MG PO TABS: 0.2500 mg | ORAL_TABLET | Freq: Three times a day (TID) | ORAL | Status: DC | PRN

## 2018-10-22 NOTE — Progress Notes (Signed)
Physical Therapy Session Note  Patient Details  Name: Rachel Fox MRN: [id number] Date of Birth: 1945/10/31  Today's Date: 10/22/2018 PT Individual Time: 1300-1420 PT Individual Time Calculation (min): 80 min   Short Term Goals: Week 3:  PT Short Term Goal 1 (Week 3): Pt will perform bed mobility with min assist PT Short Term Goal 2 (Week 3): Pt will ambulate 52f with mod assist and LRAD PT Short Term Goal 3 (Week 3): Pt will propell WC 1046fwith hemi technique and CGA PT Short Term Goal 4 (Week 3): Pt will transfer to and from bed with mod assist consistently  Skilled Therapeutic Interventions/Progress Updates:   Pt received supine in bed and agreeable to PT. Supine>sit transfer with min assist and moderate cues for sequencing and use of the LLE.   SB transfer to the L with min assist and moderate cues for posture and sequencing. Pt performed WC mobility to rehab gym with mod assist and max cues for technique and awareness of the L side. Pt instructed in stand pivot transfer to the mat table with RW and mod assist overall and mod-max cues for gait pattern. Variable  Gait training with RW forward/backward with mod assist + 2 for AD management and improved attention to the L knee to achieve terminal knee extension. Additional gait training with RW and mod A + 2 x 155fith increasing assist for R weight shift to advance the LLE.  Sitting trunk NMR to force activation of the L and improved orientation to midline. Lateral reaches to end range R and L to pick up object from floor, bench, and mat table. Mod assist overall for for improved L side trunk rotation and to increase anterior weight shift to activate R internal and external obliques.    Pt returned to room and performed squat pivot transfer to bed with mod-max assist. Sit>supine completed with min assist at the LLE, and left supine in bed with call bell in reach and all needs met.        Therapy Documentation Precautions:   Precautions Precautions: Fall Precaution Comments: L side weak/flaccid, right gaze preference, L shoulder pain (risk for subluxation), pressure injury from laying on floor PTA Restrictions Weight Bearing Restrictions: No    Vital Signs: Therapy Vitals Temp: 97.7 F (36.5 C) Pulse Rate: 62 Resp: 18 BP: 138/78 Patient Position (if appropriate): Lying Oxygen Therapy SpO2: 99 % O2 Device: Room Air Pain: Denies  Therapy/Group: Individual Therapy  AusLorie Phenix7/2020, 3:39 PM

## 2018-10-22 NOTE — Progress Notes (Signed)
Speech Language Pathology Daily Session Note  Patient Details  Name: ANTONIA JICHA MRN: [id number] Date of Birth: 06-28-1945  Today's Date: 10/22/2018 SLP Individual Time: 1000-1100 SLP Individual Time Calculation (min): 60 min  Short Term Goals: Week 4: SLP Short Term Goal 1 (Week 4): Patient will demonstate efficient mastication and complete oral clearance with trials of dys. 3 textures with Min A verbal cues for utilization of compensatory strategies. SLP Short Term Goal 2 (Week 4): Patient will utilize speech intelligibility strategies at the sentence level with Min A verbal cues to achieve ~70% intelligibility. SLP Short Term Goal 3 (Week 4): Patient will demonstrate problem solving in semi-complex tasks with Supervision verbal cues. SLP Short Term Goal 4 (Week 4): Patient will self-monitor and correct errors during semi-complex problem solving tasks with Supervision verbal and visual cues. SLP Short Term Goal 5 (Week 4): Patient will utilize memory compensatory strategies to recall new, daily information with supervision A verbal cues.  Skilled Therapeutic Interventions:  Skilled treatment session focused on cognition goals. SLP facilitated session by providing Mod A cues to facilitate transfer to bathroom d/t pt perseveration with having to urinate. Despite Max A encouragement, pt remained on toilet for ~ 45 minutes with refusal to get up and unable to void. Despite talking with this Probation officer throughout entire time, pt didn't look to left to localize writer and her speech was ~ 75% intelligible at the phrase level d/t decreased vocal intensity. SLP facilitated transfer back to bed via stedy with heavy physical assist needed to stand after sitting on toilet. Pt left in bed, bed alarm on and all needs within reach. Continue per current plan of care.      Pain    Therapy/Group: Individual Therapy  Sherill Wegener 10/22/2018, 12:00 PM

## 2018-10-22 NOTE — Plan of Care (Signed)
  Problem: RH BLADDER ELIMINATION Goal: RH STG MANAGE BLADDER WITH ASSISTANCE Description: STG Manage Bladder With Assistance - Mod Outcome: Progressing   Problem: RH SKIN INTEGRITY Goal: RH STG MAINTAIN SKIN INTEGRITY WITH ASSISTANCE Description: STG Maintain Skin Integrity With Assistance. Mod Outcome: Progressing Goal: RH STG ABLE TO PERFORM INCISION/WOUND CARE W/ASSISTANCE Description: STG Able To Perform Incision/Wound Care With Assistance. Total assist from caregiver  Outcome: Progressing   Problem: RH SAFETY Goal: RH STG ADHERE TO SAFETY PRECAUTIONS W/ASSISTANCE/DEVICE Description: STG Adhere to Safety Precautions With Assistance/Device. Mod Outcome: Progressing   Problem: RH PAIN MANAGEMENT Goal: RH STG PAIN MANAGED AT OR BELOW PT'S PAIN GOAL Description: Less than 4 Outcome: Progressing   Problem: RH KNOWLEDGE DEFICIT Goal: RH STG INCREASE KNOWLEDGE OF DIABETES Description: Patient/family will be able to verbalize target blood sugar values, medication and diet to control T2DM with cues/handouts Outcome: Progressing Goal: RH STG INCREASE KNOWLEDGE OF HYPERTENSION Description: Patient/Family will be able to verbalize target BP, interpreting blood pressure readings, medication and diet to control HTN with cues/handouts Outcome: Progressing Goal: RH STG INCREASE KNOWLEDGE OF DYSPHAGIA/FLUID INTAKE Description: Patient/family will be able to demonstrate safe swallowing techniques and appropriate consistency for food and drink with cues/handouts Outcome: Progressing   

## 2018-10-22 NOTE — Progress Notes (Signed)
[place] PHYSICAL MEDICINE & REHABILITATION PROGRESS NOTE   Subjective/Complaints: Complains of ongoing urinary urgency and frequency. No improvement with ditropan  ROS: Patient denies fever, rash, sore throat, blurred vision, nausea, vomiting, diarrhea, cough, shortness of breath or chest pain, joint or back pain, headache .    Objective:   No results found. Recent Labs    10/21/18 0607  WBC 6.1  HGB 15.2*  HCT 48.7*  PLT 210   No results for input(s): NA, K, CL, CO2, GLUCOSE, BUN, CREATININE, CALCIUM in the last 72 hours.  Intake/Output Summary (Last 24 hours) at 10/22/2018 1120 Last data filed at 10/22/2018 0900 Gross per 24 hour  Intake 540 ml  Output -  Net 540 ml     Physical Exam: Vital Signs Blood pressure 128/79, pulse 60, temperature 97.8 F (36.6 C), resp. rate 18, height 5\' 3"  (1.6 m), weight 72.1 kg, SpO2 99 %. Constitutional: No distress . Vital signs reviewed. HEENT: EOMI, oral membranes moist Neck: supple Cardiovascular: RRR without murmur. No JVD    Respiratory: CTA Bilaterally without wheezes or rales. Normal effort    GI: BS +, non-tender, non-distended  Musc: No edema or tenderness in extremities Neurologic: Alert Motor: Grossly 5/5 RUE/RLE LUE: Grossly 1/5 proximal distal--stable LLE: Grossly 2-/5 proximal distal--stable Tone 2/4 LUE and LLE Psych: pleasant but anxious.  Assessment/Plan: 1. Functional deficits secondary to right MCA infarct which require 3+ hours per day of interdisciplinary therapy in a comprehensive inpatient rehab setting.  Physiatrist is providing close team supervision and 24 hour management of active medical problems listed below.  Physiatrist and rehab team continue to assess barriers to discharge/monitor patient progress toward functional and medical goals  Care Tool:  Bathing    Body parts bathed by patient: Left arm, Abdomen, Chest, Front perineal area, Left upper leg, Right upper leg, Face   Body parts bathed  by helper: Right arm, Buttocks, Right lower leg, Left lower leg     Bathing assist Assist Level: Moderate Assistance - Patient 50 - 74%     Upper Body Dressing/Undressing Upper body dressing   What is the patient wearing?: Pull over shirt    Upper body assist Assist Level: Moderate Assistance - Patient 50 - 74%    Lower Body Dressing/Undressing Lower body dressing      What is the patient wearing?: Pants, Incontinence brief     Lower body assist Assist for lower body dressing: Maximal Assistance - Patient 25 - 49%     Toileting Toileting    Toileting assist Assist for toileting: Total Assistance - Patient < 25%     Transfers Chair/bed transfer  Transfers assist  Chair/bed transfer activity did not occur: Safety/medical concerns  Chair/bed transfer assist level: Minimal Assistance - Patient > 75%     Locomotion Ambulation   Ambulation assist   Ambulation activity did not occur: Safety/medical concerns  Assist level: Moderate Assistance - Patient 50 - 74% Assistive device: Walker-rolling Max distance: 15   Walk 10 feet activity   Assist  Walk 10 feet activity did not occur: Safety/medical concerns  Assist level: Moderate Assistance - Patient - 50 - 74% Assistive device: Walker-rolling   Walk 50 feet activity   Assist Walk 50 feet with 2 turns activity did not occur: Safety/medical concerns         Walk 150 feet activity   Assist Walk 150 feet activity did not occur: Safety/medical concerns         Walk 10 feet on  uneven surface  activity   Assist Walk 10 feet on uneven surfaces activity did not occur: Safety/medical concerns         Wheelchair     Assist Will patient use wheelchair at discharge?: Yes Type of Wheelchair: Manual    Wheelchair assist level: Minimal Assistance - Patient > 75% Max wheelchair distance: 150    Wheelchair 50 feet with 2 turns activity    Assist        Assist Level: Supervision/Verbal  cueing   Wheelchair 150 feet activity     Assist Wheelchair 150 feet activity did not occur: Safety/medical concerns   Assist Level: Minimal Assistance - Patient > 75%    Medical Problem List and Plan: 1.Lefthemiparesis, dysphagia, and visual-spatial deficitssecondary to right MCA infarction, now with spasticity  Continue CIR  Baclofen  Increased to TID  Continue WHO/PRAFO and ROM with therapies 2. Antithrombotics: -DVT/anticoagulation:Continue Eliquis -antiplatelet therapy: N/A 3. Pain Management:Tylenol as needed- added topamax for HA with some benefit increased to BID-improving -maintain proper shoulder position and support when in bed and chair.    -Voltaren gel  -added tramadol 50mg  q12 prn for severe pain 4. Mood:Provide emotional support -antipsychotic agents: N/A  -neuro-psych input would be helpful  -likely some disinhibition related to location of CVA  -?reactive anxiety/depression also an issue--add prn xanax low dose 5. Neuropsych: This patientiscapable of making decisions on herown behalf. 6. Skin/Wound Care:Routine skin checks. Aquacel, gen surg evaled there is no evidence of infection but some necrotic tissue noted.  May benefit from hydro therapy  7. Fluids/Electrolytes/Nutrition:Routine in and outs 8.Atrial fibrillation. Amiodarone 200 mg twice daily, Lanoxin 0.125 mg daily, Cardizem 90 mg 4 times daily.   Vitals:   10/21/18 2050 10/22/18 0631  BP: 127/76 128/79  Pulse: 62 60  Resp: 18 18  Temp: 98 F (36.7 C) 97.8 F (36.6 C)  SpO2: 100% 99%   HR controlled8/7, watch for excessive bradycardia 9.  Post strokedysphagia. Dysphagia #2, thin liquids  Advance diet per SLP 10. Diabetes mellitus. Hemoglobin A1c 6.1. SSI. Patient on Glucophage 500 mg twice daily prior to admission. Resume as indicated  Reasonable control 8/6 11.Rhabdomyolysis. no sign of renal failure  12. Hyperlipidemia.  Lipitor 13. Constipation. Laxative assistance 14.  Leukocytosis: Resolved  not febrile, ua equivocal, ucx multispecies  WBCs 6.1 today    15.  Hypoalbuminemia  Supplement initiated 16.  AKI  Creatinine 1.01 on 8.4   I personally reviewed the patient's labs today.   17. Urinary frequency: had some at baseline  -PVR's generally low  -increased ditropan to 5mg  bid  -UA negative  -add pyridium for urgency   LOS: 25 days A FACE TO FACE EVALUATION WAS PERFORMED  Ranelle OysterZachary T Swartz 10/22/2018, 11:20 AM

## 2018-10-22 NOTE — Progress Notes (Signed)
Occupational Therapy Session Note  Patient Details  Name: Rachel Fox MRN: [id number] Date of Birth: 12-02-1945  Today's Date: 10/22/2018 OT Individual Time: 8675-4492 OT Individual Time Calculation (min): 55 min   Short Term Goals: Week 4:  OT Short Term Goal 1 (Week 4): STGs=LTGs secondary to upcoming discharge  Skilled Therapeutic Interventions/Progress Updates:    Pt greeted in bed with c/o Lt arm pain. RN in at the start of session to provide pain medicine including topical ointment. Pt reported increased pain with gentle passive movement from OT. Supine<sit completed with Min A and increased time towards Lt side. Mattress set at max firmness while she ate her breakfast sitting EOB. Tx focus placed on sitting balance and Lt attention with tray items placed Lt of midline. Pt initially required close supervision for balance with small lateral and anterior/posterior sways. Near end of meal, Lt lean and posterior bias/LOB increased due to truncal weakness. She needed Mod A for neutral upright alignment and manual cuing for maintaining this posture. Pt had to pull herself forward using the bedside table to correct LOB vs using trunk strength. OT facilitated using Lt UE to stabilize oatmeal in lap while she ate. Pt reported no increase in Lt arm pain during this time. However she did report onset of R LE pain and wanted to return to bed. She was able to boost herself up in bed when it was placed on trendelenburg position and LEs were stabilized. After she was made comfortable, pt reported an urgent need to use the bathroom. Stedy transfer completed with Mod A for sit<stand in device. Manual cuing provided for bringing awareness to Lt leaning posture. Once pt was transferred to toilet, she was left with NT to resume B+B void.   During meal, she required vcs for tongue sweeping due to Lt pocketing.   Therapy Documentation Precautions:  Precautions Precautions: Fall Precaution Comments: L side  weak/flaccid, right gaze preference, L shoulder pain (risk for subluxation), pressure injury from laying on floor PTA Restrictions Weight Bearing Restrictions: No ADL:       Therapy/Group: Individual Therapy  Wretha Laris A Rebekka Lobello 10/22/2018, 12:08 PM

## 2018-10-23 ENCOUNTER — Inpatient Hospital Stay (HOSPITAL_COMMUNITY): Payer: Medicare Other | Admitting: Occupational Therapy

## 2018-10-23 LAB — GLUCOSE, CAPILLARY
Glucose-Capillary: 150 mg/dL — ABNORMAL HIGH (ref 70–99)
Glucose-Capillary: 98 mg/dL (ref 70–99)
Glucose-Capillary: 102 mg/dL — ABNORMAL HIGH (ref 70–99)
Glucose-Capillary: 107 mg/dL — ABNORMAL HIGH (ref 70–99)

## 2018-10-23 NOTE — Progress Notes (Signed)
Occupational Therapy Session Note  Patient Details  Name: Rachel Fox MRN: [id number] Date of Birth: 05-22-1945  Today's Date: 10/23/2018 OT Individual Time: 8891-6945 OT Individual Time Calculation (min): 55 min    Short Term Goals: Week 4:  OT Short Term Goal 1 (Week 4): STGs=LTGs secondary to upcoming discharge  Skilled Therapeutic Interventions/Progress Updates:    Treatment session with focus on Lt attention, functional mobility, and sit <> stand.  Pt received upright in bed with Nurse tech providing supervision for meal.  Pt continued to engage in self-feeding with min cues to scan tray to locate items in Lt visual field.  Pt reports pain in LUE, noted to be tucked under patient with wrist in extreme flexion.  Educated on proper positioning and attention to Lt.  Engaged in [place] at bed level with focus on Lt attention and rolling.  Engaged in sit <> stand in [place] with focus on midline standing balance and weight shifting to obtain midline posture.  Pt with minimal awareness of Lt lean requiring multimodal cues to correct.  Pt reports need to toilet.  Transferred to toilet with Stedy.  Pt able to void bladder with increased time.  Total assist for hygiene and clothing management post toileting with pt focus on midline standing posture in [place].  Returned to bed and left with LUE elevated on pillow for improved positioning.  Therapy Documentation Precautions:  Precautions Precautions: Fall Precaution Comments: L side weak/flaccid, right gaze preference, L shoulder pain (risk for subluxation), pressure injury from laying on floor PTA Restrictions Weight Bearing Restrictions: No General:   Vital Signs: Therapy Vitals Temp: 97.7 F (36.5 C) Temp Source: Oral Pulse Rate: 71 Resp: 18 BP: 140/71 Patient Position (if appropriate): Sitting Oxygen Therapy SpO2: 97 % O2 Device: Room Air Pain:  Pt with c/o pain in LUE, Repositioned.   Therapy/Group: Individual  Therapy  Simonne Come 10/23/2018, 3:19 PM

## 2018-10-23 NOTE — Plan of Care (Signed)
  Problem: RH BLADDER ELIMINATION Goal: RH STG MANAGE BLADDER WITH ASSISTANCE Description: STG Manage Bladder With Assistance - Mod Outcome: Progressing   Problem: RH SKIN INTEGRITY Goal: RH STG ABLE TO PERFORM INCISION/WOUND CARE W/ASSISTANCE Description: STG Able To Perform Incision/Wound Care With Assistance. Total assist from caregiver  Outcome: Progressing   Problem: RH SAFETY Goal: RH STG ADHERE TO SAFETY PRECAUTIONS W/ASSISTANCE/DEVICE Description: STG Adhere to Safety Precautions With Assistance/Device. Mod Outcome: Progressing   Problem: RH PAIN MANAGEMENT Goal: RH STG PAIN MANAGED AT OR BELOW PT'S PAIN GOAL Description: Less than 4 Outcome: Not Progressing   Problem: RH KNOWLEDGE DEFICIT Goal: RH STG INCREASE KNOWLEDGE OF DIABETES Description: Patient/family will be able to verbalize target blood sugar values, medication and diet to control T2DM with cues/handouts Outcome: Progressing Goal: RH STG INCREASE KNOWLEDGE OF HYPERTENSION Description: Patient/Family will be able to verbalize target BP, interpreting blood pressure readings, medication and diet to control HTN with cues/handouts Outcome: Progressing Goal: RH STG INCREASE KNOWLEDGE OF DYSPHAGIA/FLUID INTAKE Description: Patient/family will be able to demonstrate safe swallowing techniques and appropriate consistency for food and drink with cues/handouts Outcome: Progressing

## 2018-10-23 NOTE — Progress Notes (Signed)
Physical Therapy Session Note  Patient Details  Name: Rachel Fox MRN: [id number] Date of Birth: 1946/01/29  Today's Date: 10/23/2018 PT Individual Time: 1440(make up time)-1510 PT Individual Time Calculation (min): 30 min   Short Term Goals: Week 3:  PT Short Term Goal 1 (Week 3): Pt will perform bed mobility with min assist PT Short Term Goal 2 (Week 3): Pt will ambulate 67ft with mod assist and LRAD PT Short Term Goal 3 (Week 3): Pt will propell WC 152ft with hemi technique and CGA PT Short Term Goal 4 (Week 3): Pt will transfer to and from bed with mod assist consistently  Skilled Therapeutic Interventions/Progress Updates:   Pt in supine and agreeable to make-up therapy time. No c/o pain. Pt requesting assistance w/ changing out of pajamas. Supine>sit w/ max assist and max assist sit<>stand transfers to stedy. Stedy transfer to w/c, min tactile and verbal cues for upright posture and midline. Multiple sit<>stands at sink w/ max assist and therapist blocking L knee, tactile and verbal cues to maintain L knee extension in static stance. Pt put lotion on thighs, tops of lower legs, and LUE w/o assist, needed physicla assist to reach B feet. Tactile and manual cues for pelvic weight shifting in seated to reach legs. Total assist for LB dressing, mod assist for UB dressing at seated level. Tactile, verbal, and manual cues for hemi technique to don shirt. Ended session in w/c, all needs in reach.   Therapy Documentation Precautions:  Precautions Precautions: Fall Precaution Comments: L side weak/flaccid, right gaze preference, L shoulder pain (risk for subluxation), pressure injury from laying on floor PTA Restrictions Weight Bearing Restrictions: No Vital Signs: Therapy Vitals Temp: 97.7 F (36.5 C) Temp Source: Oral Pulse Rate: 71 Resp: 18 BP: 140/71 Patient Position (if appropriate): Sitting Oxygen Therapy SpO2: 97 % O2 Device: Room Air  Therapy/Group: Individual  Therapy  Glover Capano K Denzel Etienne 10/23/2018, 4:30 PM

## 2018-10-23 NOTE — Progress Notes (Signed)
[place] PHYSICAL MEDICINE & REHABILITATION PROGRESS NOTE   Subjective/Complaints: No new complaints. Pyridium helped with urgency. Was able to sleep last night  ROS: Patient denies fever, rash, sore throat, blurred vision, nausea, vomiting, diarrhea, cough, shortness of breath or chest pain,   headache, or mood change.    Objective:   No results found. Recent Labs    10/21/18 0607  WBC 6.1  HGB 15.2*  HCT 48.7*  PLT 210   No results for input(s): NA, K, CL, CO2, GLUCOSE, BUN, CREATININE, CALCIUM in the last 72 hours.  Intake/Output Summary (Last 24 hours) at 10/23/2018 1029 Last data filed at 10/23/2018 0830 Gross per 24 hour  Intake 553 ml  Output 152 ml  Net 401 ml     Physical Exam: Vital Signs Blood pressure 133/73, pulse (!) 59, temperature 97.6 F (36.4 C), temperature source Oral, resp. rate 18, height 5\' 3"  (1.6 m), weight 72.1 kg, SpO2 100 %. Constitutional: No distress . Vital signs reviewed. HEENT: EOMI, oral membranes moist Neck: supple Cardiovascular: RRR without murmur. No JVD    Respiratory: CTA Bilaterally without wheezes or rales. Normal effort    GI: BS +, non-tender, non-distended   Musc: No edema or tenderness in extremities Neurologic: Alert Motor: Grossly 5/5 RUE/RLE LUE: Grossly 1/5 proximal distal--no change LLE: Grossly 2-/5 proximal distal--no chabnge Tone 2/4 LUE and LLE Psych: pleasant less anxious.  Assessment/Plan: 1. Functional deficits secondary to right MCA infarct which require 3+ hours per day of interdisciplinary therapy in a comprehensive inpatient rehab setting.  Physiatrist is providing close team supervision and 24 hour management of active medical problems listed below.  Physiatrist and rehab team continue to assess barriers to discharge/monitor patient progress toward functional and medical goals  Care Tool:  Bathing    Body parts bathed by patient: Left arm, Abdomen, Chest, Front perineal area, Left upper leg, Right  upper leg, Face   Body parts bathed by helper: Right arm, Buttocks, Right lower leg, Left lower leg     Bathing assist Assist Level: Moderate Assistance - Patient 50 - 74%     Upper Body Dressing/Undressing Upper body dressing   What is the patient wearing?: Pull over shirt    Upper body assist Assist Level: Moderate Assistance - Patient 50 - 74%    Lower Body Dressing/Undressing Lower body dressing      What is the patient wearing?: Pants, Incontinence brief     Lower body assist Assist for lower body dressing: Maximal Assistance - Patient 25 - 49%     Toileting Toileting    Toileting assist Assist for toileting: Total Assistance - Patient < 25%     Transfers Chair/bed transfer  Transfers assist  Chair/bed transfer activity did not occur: Safety/medical concerns  Chair/bed transfer assist level: Minimal Assistance - Patient > 75%     Locomotion Ambulation   Ambulation assist   Ambulation activity did not occur: Safety/medical concerns  Assist level: Moderate Assistance - Patient 50 - 74% Assistive device: Walker-rolling Max distance: 15   Walk 10 feet activity   Assist  Walk 10 feet activity did not occur: Safety/medical concerns  Assist level: Moderate Assistance - Patient - 50 - 74% Assistive device: Walker-rolling   Walk 50 feet activity   Assist Walk 50 feet with 2 turns activity did not occur: Safety/medical concerns         Walk 150 feet activity   Assist Walk 150 feet activity did not occur: Safety/medical concerns  Walk 10 feet on uneven surface  activity   Assist Walk 10 feet on uneven surfaces activity did not occur: Safety/medical concerns         Wheelchair     Assist Will patient use wheelchair at discharge?: Yes Type of Wheelchair: Manual    Wheelchair assist level: Minimal Assistance - Patient > 75% Max wheelchair distance: 150    Wheelchair 50 feet with 2 turns activity    Assist         Assist Level: Supervision/Verbal cueing   Wheelchair 150 feet activity     Assist Wheelchair 150 feet activity did not occur: Safety/medical concerns   Assist Level: Minimal Assistance - Patient > 75%    Medical Problem List and Plan: 1.Lefthemiparesis, dysphagia, and visual-spatial deficitssecondary to right MCA infarction, now with spasticity  Continue CIR  Baclofen  Increased to 10mg TID--she is tolerating with some fatigue. Likely unable to titrate much further. Botulinum toxin candidate as outpt  Continue WHO/PRAFO and ROM with therapies 2. Antithrombotics: -DVT/anticoagulation:Continue Eliquis -antiplatelet therapy: N/A 3. Pain Management:Tylenol as needed- added topamax for HA with some benefit increased to BID-improving -maintain proper shoulder position and support when in bed and chair.    -Voltaren gel  -added tramadol 50mg  q12 prn for severe pain 4. Mood:Provide emotional support -antipsychotic agents: N/A  -neuro-psych input would be helpful  -likely some disinhibition related to location of CVA  -?reactive anxiety/depression also an issue--add prn xanax low dose 5. Neuropsych: This patientiscapable of making decisions on herown behalf. 6. Skin/Wound Care:Routine skin checks. Aquacel, gen surg evaled there is no evidence of infection but some necrotic tissue noted.  May benefit from hydro therapy  7. Fluids/Electrolytes/Nutrition:Routine in and outs 8.Atrial fibrillation. Amiodarone 200 mg twice daily, Lanoxin 0.125 mg daily, Cardizem 90 mg 4 times daily.   Vitals:   10/22/18 2131 10/23/18 0529  BP: 125/72 133/73  Pulse: (!) 53 (!) 59  Resp:  18  Temp: 98.2 F (36.8 C) 97.6 F (36.4 C)  SpO2: 97% 100%   HR controlled, HR in 50's 9.  Post strokedysphagia. Dysphagia #2, thin liquids  Advance diet per SLP 10. Diabetes mellitus. Hemoglobin A1c 6.1. SSI. Patient on Glucophage 500 mg twice daily  prior to admission. Resume as indicated  Reasonable control 8/8 11.Rhabdomyolysis. no sign of renal failure  12. Hyperlipidemia. Lipitor 13. Constipation. Laxative assistance 14.  Leukocytosis: Resolved  not febrile, ua equivocal, ucx multispecies  WBCs 6.1      15.  Hypoalbuminemia  Supplement initiated 16.  AKI  Creatinine 1.01 on 8.4   I personally reviewed the patient's labs today.   17. Urinary frequency: had some at baseline  -PVR's generally low  -increased ditropan to 5mg  bid  -UA negative  -added pyridium for urgency which seems to have helped although still voiding fairly frequently  LOS: 26 days A FACE TO FACE EVALUATION WAS PERFORMED  Ranelle OysterZachary T  10/23/2018, 10:29 AM

## 2018-10-24 LAB — GLUCOSE, CAPILLARY
Glucose-Capillary: 121 mg/dL — ABNORMAL HIGH (ref 70–99)
Glucose-Capillary: 94 mg/dL (ref 70–99)
Glucose-Capillary: 121 mg/dL — ABNORMAL HIGH (ref 70–99)
Glucose-Capillary: 130 mg/dL — ABNORMAL HIGH (ref 70–99)

## 2018-10-24 MED ORDER — OXYBUTYNIN CHLORIDE 5 MG PO TABS
5.0000 mg | ORAL_TABLET | Freq: Three times a day (TID) | ORAL | Status: DC
  Administered 2018-10-24 (×2): 5 mg via ORAL
  Filled 2018-10-24 (×3): qty 1

## 2018-10-24 NOTE — Plan of Care (Signed)
  Problem: RH BLADDER ELIMINATION Goal: RH STG MANAGE BLADDER WITH ASSISTANCE Description: STG Manage Bladder With Assistance - Mod Outcome: Progressing   Problem: RH SKIN INTEGRITY Goal: RH STG MAINTAIN SKIN INTEGRITY WITH ASSISTANCE Description: STG Maintain Skin Integrity With Assistance. Mod Outcome: Progressing Goal: RH STG ABLE TO PERFORM INCISION/WOUND CARE W/ASSISTANCE Description: STG Able To Perform Incision/Wound Care With Assistance. Total assist from caregiver  Outcome: Progressing   Problem: RH SAFETY Goal: RH STG ADHERE TO SAFETY PRECAUTIONS W/ASSISTANCE/DEVICE Description: STG Adhere to Safety Precautions With Assistance/Device. Mod Outcome: Progressing   Problem: RH PAIN MANAGEMENT Goal: RH STG PAIN MANAGED AT OR BELOW PT'S PAIN GOAL Description: Less than 4 Outcome: Progressing   Problem: RH KNOWLEDGE DEFICIT Goal: RH STG INCREASE KNOWLEDGE OF DIABETES Description: Patient/family will be able to verbalize target blood sugar values, medication and diet to control T2DM with cues/handouts Outcome: Progressing Goal: RH STG INCREASE KNOWLEDGE OF HYPERTENSION Description: Patient/Family will be able to verbalize target BP, interpreting blood pressure readings, medication and diet to control HTN with cues/handouts Outcome: Progressing Goal: RH STG INCREASE KNOWLEDGE OF DYSPHAGIA/FLUID INTAKE Description: Patient/family will be able to demonstrate safe swallowing techniques and appropriate consistency for food and drink with cues/handouts Outcome: Progressing

## 2018-10-24 NOTE — Progress Notes (Signed)
Physical Therapy Weekly Progress Note  Patient Details  Name: Rachel Fox MRN: [id number] Date of Birth: 03/25/1945  Beginning of progress report period: October 16, 2018 End of progress report period: October 24, 2018  Today's Date: 10/24/2018     Patient has met 3 of 4 short term goals.  Pt continues to make slow progress towards LTGs. Continued midline disorientation in standing, apraxia, decreased awareness and increasing tone in the LUE and LLE, limit further progress at this time. Pt can preformed bed mobility with min A and control of the LLE, min-mod assist bed<>WC transfers with max cues for sequencing and safety, mod assist WC mobliity and max assist ambulation using HW or RW with L hand orthotic.   Patient continues to demonstrate the following deficits muscle weakness, muscle joint tightness and muscle paralysis, decreased cardiorespiratoy endurance, impaired timing and sequencing, abnormal tone, unbalanced muscle activation, motor apraxia and decreased motor planning, decreased visual perceptual skills, decreased midline orientation and decreased attention to left, decreased initiation, decreased attention, decreased awareness, decreased problem solving, decreased safety awareness, decreased memory and delayed processing and decreased sitting balance, decreased standing balance, decreased postural control, hemiplegia and decreased balance strategies and therefore will continue to benefit from skilled PT intervention to increase functional independence with mobility.  Patient progressing toward long term goals..  Continue plan of care.  PT Short Term Goals Week 3:  PT Short Term Goal 1 (Week 3): Pt will perform bed mobility with min assist PT Short Term Goal 1 - Progress (Week 3): Met PT Short Term Goal 2 (Week 3): Pt will ambulate 108f with mod assist and LRAD PT Short Term Goal 2 - Progress (Week 3): Not met PT Short Term Goal 3 (Week 3): Pt will propell WC [id number] hemi  technique and CGA PT Short Term Goal 3 - Progress (Week 3): Progressing toward goal PT Short Term Goal 4 (Week 3): Pt will transfer to and from bed with mod assist consistently PT Short Term Goal 4 - Progress (Week 3): Met Week 4:  PT Short Term Goal 1 (Week 4): STG=LTG due to ELOS  Skilled Therapeutic Interventions/Progress Updates:      Therapy Documentation Precautions:  Precautions Precautions: Fall Precaution Comments: L side weak/flaccid, right gaze preference, L shoulder pain (risk for subluxation), pressure injury from laying on floor PTA Restrictions Weight Bearing Restrictions: No Vital Signs: Therapy Vitals Temp: 98.1 F (36.7 C) Temp Source: Oral Pulse Rate: 64 Resp: 16 BP: 121/69 Patient Position (if appropriate): Lying Oxygen Therapy SpO2: 99 % O2 Device: Room Air   Therapy/Group: Individual Therapy  AuLorie Phenix/11/2018, 10:54 PM

## 2018-10-24 NOTE — Progress Notes (Signed)
[facility] PHYSICAL MEDICINE & REHABILITATION PROGRESS NOTE   Subjective/Complaints: No new issues. Seems to have slept better. Still with urinary frequency  ROS: Patient denies fever, rash, sore throat, blurred vision, nausea, vomiting, diarrhea, cough, shortness of breath or chest pain,  headache, or mood change.   Objective:   No results found. No results for input(s): WBC, HGB, HCT, PLT in the last 72 hours. No results for input(s): NA, K, CL, CO2, GLUCOSE, BUN, CREATININE, CALCIUM in the last 72 hours.  Intake/Output Summary (Last 24 hours) at 10/24/2018 1000 Last data filed at 10/24/2018 0200 Gross per 24 hour  Intake 200 ml  Output 200 ml  Net 0 ml     Physical Exam: Vital Signs Blood pressure 132/67, pulse 68, temperature 97.7 F (36.5 C), temperature source Oral, resp. rate 14, height 5\' 3"  (1.6 m), weight 72.1 kg, SpO2 96 %. Constitutional: No distress . Vital signs reviewed. HEENT: EOMI, oral membranes moist Neck: supple Cardiovascular: RRR without murmur. No JVD    Respiratory: CTA Bilaterally without wheezes or rales. Normal effort    GI: BS +, non-tender, non-distended   Musc: No edema or tenderness in extremities Neurologic: Alert Motor: Grossly 5/5 RUE/RLE LUE: Grossly 1/5 proximal distal--stable LLE: Grossly 2-/5 proximal distal--stable Tone 2/4 flexor LUE and extensor LLE Psych: pleasant.  Assessment/Plan: 1. Functional deficits secondary to right MCA infarct which require 3+ hours per day of interdisciplinary therapy in a comprehensive inpatient rehab setting.  Physiatrist is providing close team supervision and 24 hour management of active medical problems listed below.  Physiatrist and rehab team continue to assess barriers to discharge/monitor patient progress toward functional and medical goals  Care Tool:  Bathing    Body parts bathed by patient: Left arm, Abdomen, Chest, Front perineal area, Left upper leg, Right upper leg, Face   Body parts  bathed by helper: Right arm, Buttocks, Right lower leg, Left lower leg     Bathing assist Assist Level: Moderate Assistance - Patient 50 - 74%     Upper Body Dressing/Undressing Upper body dressing   What is the patient wearing?: Pull over shirt    Upper body assist Assist Level: Moderate Assistance - Patient 50 - 74%    Lower Body Dressing/Undressing Lower body dressing      What is the patient wearing?: Pants, Incontinence brief     Lower body assist Assist for lower body dressing: Maximal Assistance - Patient 25 - 49%     Toileting Toileting    Toileting assist Assist for toileting: Total Assistance - Patient < 25%     Transfers Chair/bed transfer  Transfers assist  Chair/bed transfer activity did not occur: Safety/medical concerns  Chair/bed transfer assist level: Dependent - mechanical lift     Locomotion Ambulation   Ambulation assist   Ambulation activity did not occur: Safety/medical concerns  Assist level: Moderate Assistance - Patient 50 - 74% Assistive device: Walker-rolling Max distance: 15   Walk 10 feet activity   Assist  Walk 10 feet activity did not occur: Safety/medical concerns  Assist level: Moderate Assistance - Patient - 50 - 74% Assistive device: Walker-rolling   Walk 50 feet activity   Assist Walk 50 feet with 2 turns activity did not occur: Safety/medical concerns         Walk 150 feet activity   Assist Walk 150 feet activity did not occur: Safety/medical concerns         Walk 10 feet on uneven surface  activity  Assist Walk 10 feet on uneven surfaces activity did not occur: Safety/medical concerns         Wheelchair     Assist Will patient use wheelchair at discharge?: Yes Type of Wheelchair: Manual    Wheelchair assist level: Minimal Assistance - Patient > 75% Max wheelchair distance: 150    Wheelchair 50 feet with 2 turns activity    Assist        Assist Level: Supervision/Verbal  cueing   Wheelchair 150 feet activity     Assist Wheelchair 150 feet activity did not occur: Safety/medical concerns   Assist Level: Minimal Assistance - Patient > 75%    Medical Problem List and Plan: 1.Lefthemiparesis, dysphagia, and visual-spatial deficitssecondary to right MCA infarction, now with spasticity  Continue CIR  Baclofen  Increased to 10mg TID--she is tolerating with some fatigue. Likely unable to titrate much further. Botulinum toxin candidate as outpt  Continue WHO/PRAFO and ROM with therapies 2. Antithrombotics: -DVT/anticoagulation:Continue Eliquis -antiplatelet therapy: N/A 3. Pain Management:Tylenol as needed- added topamax for HA with some benefit increased to BID-improving -maintain proper shoulder position and support when in bed and chair.    -Voltaren gel  -added tramadol 50mg  q12 prn for severe pain 4. Mood:Provide emotional support -antipsychotic agents: N/A  -neuro-psych input would be helpful  -likely some disinhibition related to location of CVA  -?reactive anxiety/depression also an issue--add prn xanax low dose 5. Neuropsych: This patientiscapable of making decisions on herown behalf. 6. Skin/Wound Care:Routine skin checks. Aquacel, gen surg evaled there is no evidence of infection but some necrotic tissue noted.  May benefit from hydro therapy  7. Fluids/Electrolytes/Nutrition:Routine in and outs 8.Atrial fibrillation. Amiodarone 200 mg twice daily, Lanoxin 0.125 mg daily, Cardizem 90 mg 4 times daily.   Vitals:   10/24/18 0526 10/24/18 0801  BP: 132/67   Pulse: 71 68  Resp: 14   Temp: 97.7 F (36.5 C)   SpO2: 96%    HR controlled, HR in 50's 9.  Post strokedysphagia. Dysphagia #2, thin liquids  Advance diet per SLP 10. Diabetes mellitus. Hemoglobin A1c 6.1. SSI. Patient on Glucophage 500 mg twice daily prior to admission. Resume as indicated  Reasonable control  8/9 11.Rhabdomyolysis. no sign of renal failure  12. Hyperlipidemia. Lipitor 13. Constipation. Laxative assistance 14.  Leukocytosis: Resolved  not febrile, ua equivocal, ucx multispecies  WBCs 6.1      15.  Hypoalbuminemia  Supplement initiated 16.  AKI  Creatinine 1.01 on 8.4     17. Urinary frequency: had some at baseline  -PVR's generally low  -increased ditropan to 5mg  bid, will try TID to see if she can tolerate  -UA negative  -continue pyridium for urgency thru today  LOS: 27 days A FACE TO [place] 10/24/2018, 10:00 AM

## 2018-10-25 ENCOUNTER — Inpatient Hospital Stay (HOSPITAL_COMMUNITY): Payer: Medicare Other | Admitting: Physical Therapy

## 2018-10-25 ENCOUNTER — Inpatient Hospital Stay (HOSPITAL_COMMUNITY): Payer: Medicare Other | Admitting: Occupational Therapy

## 2018-10-25 ENCOUNTER — Inpatient Hospital Stay (HOSPITAL_COMMUNITY): Payer: Medicare Other

## 2018-10-25 LAB — BASIC METABOLIC PANEL
Anion gap: 10 (ref 5–15)
BUN: 22 mg/dL (ref 8–23)
CO2: 26 mmol/L (ref 22–32)
Calcium: 10.1 mg/dL (ref 8.9–10.3)
Chloride: 111 mmol/L (ref 98–111)
Creatinine, Ser: 1.24 mg/dL — ABNORMAL HIGH (ref 0.44–1.00)
GFR calc Af Amer: 51 mL/min — ABNORMAL LOW (ref >60)
GFR calc non Af Amer: 44 mL/min — ABNORMAL LOW (ref >60)
Glucose, Bld: 133 mg/dL — ABNORMAL HIGH (ref 70–99)
Potassium: 4.2 mmol/L (ref 3.5–5.1)
Sodium: 147 mmol/L — ABNORMAL HIGH (ref 135–145)

## 2018-10-25 LAB — GLUCOSE, CAPILLARY
Glucose-Capillary: 138 mg/dL — ABNORMAL HIGH (ref 70–99)
Glucose-Capillary: 116 mg/dL — ABNORMAL HIGH (ref 70–99)
Glucose-Capillary: 109 mg/dL — ABNORMAL HIGH (ref 70–99)
Glucose-Capillary: 83 mg/dL (ref 70–99)

## 2018-10-25 MED ORDER — OXYBUTYNIN CHLORIDE ER 5 MG PO TB24
5.0000 mg | ORAL_TABLET | Freq: Every day | ORAL | Status: DC
  Administered 2018-10-25: 5 mg via ORAL
  Filled 2018-10-25: qty 1

## 2018-10-25 NOTE — Plan of Care (Signed)
  Problem: RH Balance Goal: LTG: Patient will maintain dynamic sitting balance (OT) Description: LTG:  Patient will maintain dynamic sitting balance with assistance during activities of daily living (OT) Flowsheets (Taken 10/25/2018 1549) LTG: Pt will maintain dynamic sitting balance during ADLs with: Moderate Assistance - Patient 50 - 74% Note: Downgraded due to slow progress   Problem: RH Eating Goal: LTG Patient will perform eating w/assist, cues/equip (OT) Description: LTG: Patient will perform eating with assist, with/without cues using equipment (OT) Flowsheets (Taken 10/25/2018 1549) LTG: Pt will perform eating with assistance level of: Supervision/Verbal cueing Note: Downgraded due to slow progress   Problem: RH Dressing Goal: LTG Patient will perform upper body dressing (OT) Description: LTG Patient will perform upper body dressing with assist, with/without cues (OT). Flowsheets (Taken 10/25/2018 1550) LTG: Pt will perform upper body dressing with assistance level of: Minimal Assistance - Patient > 75% Note: Downgraded due to slow progress Goal: LTG Patient will perform lower body dressing w/assist (OT) Description: LTG: Patient will perform lower body dressing with assist, with/without cues in positioning using equipment (OT) Flowsheets (Taken 10/25/2018 1550) LTG: Pt will perform lower body dressing with assistance level of: Moderate Assistance - Patient 50 - 74% Note: Downgraded due to slow progress   Problem: RH Toileting Goal: LTG Patient will perform toileting task (3/3 steps) with assistance level (OT) Description: LTG: Patient will perform toileting task (3/3 steps) with assistance level (OT)  Flowsheets (Taken 10/25/2018 1550) LTG: Pt will perform toileting task (3/3 steps) with assistance level: Moderate Assistance - Patient 50 - 74% Note: Downgraded due to slow progress   Problem: RH Toilet Transfers Goal: LTG Patient will perform toilet transfers w/assist  (OT) Description: LTG: Patient will perform toilet transfers with assist, with/without cues using equipment (OT) Flowsheets (Taken 10/25/2018 1550) LTG: Pt will perform toilet transfers with assistance level of: Moderate Assistance - Patient 50 - 74% Note: Downgraded due to slow progress   Problem: RH Tub/Shower Transfers Goal: LTG Patient will perform tub/shower transfers w/assist (OT) Description: LTG: Patient will perform tub/shower transfers with assist, with/without cues using equipment (OT) Flowsheets (Taken 10/25/2018 1550) LTG: Pt will perform tub/shower stall transfers with assistance level of: Moderate Assistance - Patient 50 - 74% Note: Downgraded due to slow progress

## 2018-10-25 NOTE — Progress Notes (Signed)
Physical Therapy Session Note  Patient Details  Name: Rachel Fox MRN: [id number] Date of Birth: [birth year]  Today's Date: 10/25/2018 PT Individual Time: 0258-5277 PT Individual Time Calculation (min): 55 min   Short Term Goals: Week 4:  PT Short Term Goal 1 (Week 4): STG=LTG due to ELOS  Skilled Therapeutic Interventions/Progress Updates:  Pt received in bed with c/o unrated buttock pain & air mattress observed to be off & deflated, unsure how long pt has been lying in bed in this manner. RN made aware of pt's bed issue. Therapist donned/doffed B shoes dependent assist for time management. Pt requires mod assist for supine>sitting EOB with bed flat & bed rails. Pt reports need to use restroom and transfers sit<>stand in stedy with min assist, with therapist electing to use stedy for time management. While sitting in stedy pt appears to push L with cuing to shift shoulders to R but poor return demo. Pt requires total assist for clothing management & transfers stedy<>toilet with min assist & pt with continent void on toilet with extra time for BM but pt unable to void bowels. Pt performs hand hygiene at sink from stedy with assistance to elevate LUE. Pt reports unrated LUE pain during hand hygiene with repositioning provided for increased comfort. Pt transferred to w/c and assisted to gym via w/c dependent assist for time management. Pt transfers w/c<>mat with mod/max assist for squat pivot with decreased assistance when transferring to R but therapist providing max cuing for full pivot as pt will attempt to sit halfway between w/c & mat. From EOM pt transferred sit<>stand with max assist with therapist providing blocking/approximating at L knee to prevent buckling. Utilized [employer] for [employer] re: posture while pt stood with up to max assist to engage in reaching for clothespins overhead to R then laying them on w/c seat to L of midline. Activity focused on correcting L lateral lean/pushing L  with RLE, weight shifting, dynamic balance, upright posture & midline orientation & LLE NMR through weight bearing. Pt continues to sit with neck in R gaze preference but is able to turn head to L with cuing; educated pt on need to actively look left & R gaze preference a result of the stroke. At end of session pt transfers back to bed with squat pivot & requires mod assist for sit>supine. Placed pillows under pt's backside to promote R sidelying to offload pressure on pt's sacral wound. Pt left with bed inflated & alarm set, call bell in pt's hand.   Therapy Documentation Precautions:  Precautions Precautions: Fall Precaution Comments: L side weak/flaccid, right gaze preference, L shoulder pain (risk for subluxation), pressure injury from laying on floor PTA Restrictions Weight Bearing Restrictions: No   Therapy/Group: Individual Therapy  Waunita Schooner 10/25/2018, 3:38 PM

## 2018-10-25 NOTE — Progress Notes (Signed)
Occupational Therapy Session Note  Patient Details  Name: Rachel Fox MRN: [id number] Date of Birth: Nov 11, 1945  Today's Date: 10/25/2018 OT Individual Time: 1115-1200 OT Individual Time Calculation (min): 45 min    Short Term Goals: Week 4:  OT Short Term Goal 1 (Week 4): STGs=LTGs secondary to upcoming discharge  Skilled Therapeutic Interventions/Progress Updates:    Upon entering the room, pt seated in wheelchair and appearing to be much more alert than when this therapist saw her last. Pt is agreeable to OT intervention. Pt appears to have some food still in mouth and utilizing suction toothbrush to clean mouth but then needing additional assistance from therapist for thoroughness. Oral hygiene taking 20 minutes to get all the food safely from mouth. OT performed PROM to L UE for digits, wrist, and elbow. OT not providing PROM to shoulder as pt reports increased pain with movement. Pt then demonstrated self ROM in same planes with min cuing for technique. OT performed retrograde massage to L UE secondary to increased edema noted as well. Pt remained in wheelchair at end of session with call bell and all needed items within reach. Chair alarm activated.   Therapy Documentation Precautions:  Precautions Precautions: Fall Precaution Comments: L side weak/flaccid, right gaze preference, L shoulder pain (risk for subluxation), pressure injury from laying on floor PTA Restrictions Weight Bearing Restrictions: No General:   Vital Signs: Therapy Vitals Pulse Rate: 83 BP: (!) 154/91 Pain: Pain Assessment Pain Scale: Faces Pain Score: 0-No pain Faces Pain Scale: Hurts little more Pain Type: Acute pain Pain Location: Shoulder Pain Orientation: Left Pain Descriptors / Indicators: Aching;Discomfort Pain Onset: On-going Patients Stated Pain Goal: 0 Pain Intervention(s): Medication (See eMAR)(baclofen, voltaren gel)   Therapy/Group: Individual Therapy  Gypsy Decant 10/25/2018, 12:23 PM

## 2018-10-25 NOTE — Progress Notes (Signed)
Occupational Therapy Session Note  Patient Details  Name: Rachel Fox MRN: [id number] Date of Birth: 29-Sep-1945  Today's Date: 10/25/2018 OT Individual Time: [id number] OT Individual Time Calculation (min): 73 min   Short Term Goals: Week 4:  OT Short Term Goal 1 (Week 4): STGs=LTGs secondary to upcoming discharge  Skilled Therapeutic Interventions/Progress Updates:    Pt greeted with NT present, in the middle of Stedy transfer to the toilet. OT provided supervision while she voided bladder, requiring increased time to do so. Unable to void bowels. Min A for sit<stand in [place] and manual cuing for obtaining neutral alignment due to Lt lean. She completed perihygiene in the front and OT assisted in the back to ensure cleanliness. She transferred to w/c placed at the sink and proceeded with LB bathing/dressing. Per pt, she had already washed and dressed UB this AM with staff. She was able to obtain figure 4 with R LE, however still cannot reach her foot to wash. Max A for placing L LE into figure 4 for bathing and dressing tasks. Pt able to pull up pull-ups and pants from B calves up to thighs with cuing. Total A for thigh high Teds and sneakers. Mod A for sit<stand and for standing balance due to Lt lean/push. OT elevated pull-ups and pt was able to elevate her pants fully with the same balance assist. Worked on pt using visual feedback from mirror to improve midline orientation. Unable to facilitate L UE weightbearing on sink due to hypertonicity and pain. Noted increased time needed to motor plan this AM, especially when completing oral care in standing. She required step by step cues for sequencing. OT incorporated L UE as gross stabilizer with pt reporting no increase in pain during. Pt had to resume with rinsing mouth out while seated due to onset of L LE pain. Noted increased food residue in the sink from dinner last night (she had not yet eaten breakfast). RN in during session, notified her of  pts need for pain medicine and also about food residue. OT set her up with hand sanitizer to wash hands with vcs for inclusion of L UE. She was positioned at bedside with safety belt fastened and L UE elevated on pillows with half lap tray, wearing resting hand splint. Pt left in care of RN at end of tx.   Therapy Documentation Precautions:  Precautions Precautions: Fall Precaution Comments: L side weak/flaccid, right gaze preference, L shoulder pain (risk for subluxation), pressure injury from laying on floor PTA Restrictions Weight Bearing Restrictions: No Vital Signs: Therapy Vitals Temp: 97.6 F (36.4 C) Pulse Rate: 65 Resp: 14 BP: (!) 146/73 Patient Position (if appropriate): Lying Oxygen Therapy SpO2: 100 % O2 Device: Room Air Pain: Pain Assessment Pain Score: 0-No pain ADL:       Therapy/Group: Individual Therapy  Darby Shadwick A Ralene Gasparyan 10/25/2018, 3:37 PM

## 2018-10-25 NOTE — Progress Notes (Signed)
Speech Language Pathology Daily Session Note  Patient Details  Name: LAILONI BAQUERA MRN: [id number] Date of Birth: Oct 25, 1945  Today's Date: 10/25/2018 SLP Individual Time: 1002-1058 SLP Individual Time Calculation (min): 56 min  Short Term Goals: Week 4: SLP Short Term Goal 1 (Week 4): Patient will demonstate efficient mastication and complete oral clearance with trials of dys. 3 textures with Min A verbal cues for utilization of compensatory strategies. SLP Short Term Goal 2 (Week 4): Patient will utilize speech intelligibility strategies at the sentence level with Min A verbal cues to achieve ~70% intelligibility. SLP Short Term Goal 3 (Week 4): Patient will demonstrate problem solving in semi-complex tasks with Supervision verbal cues. SLP Short Term Goal 4 (Week 4): Patient will self-monitor and correct errors during semi-complex problem solving tasks with Supervision verbal and visual cues. SLP Short Term Goal 5 (Week 4): Patient will utilize memory compensatory strategies to recall new, daily information with supervision A verbal cues.  Skilled Therapeutic Interventions: Skilled ST services focused on swallow and cognitive skills. Pt was consuming breakfast tray with NT present upon entering. SLP facilitated PO consumption of dys 2 and thin via cup breakfast tray, pt demonstrated mild oral residue and required min A verbal cues to utilizie strategies to remove residue ( lingual sweeps, liquid wash and finger sweep/spoon sweep). Pt was able to locate items to left of midline on tray, however was fatigued (pt reported due to medication) and required mod A verbal cues for sustained attention during PO consumption. SLP also facilitated semi-complex problem solving and emergent awareness in novel PEG design task, pt required max A fading to mod A verbal cues and max A verbal cues for initial planning of coordinates. Pt demonstrated intermittent confusion commenting about being at a "car  dealership" however with upon questioned independently looked around the room and stated she was at the hospital.  Pt demonstrated 70% intelligibility at sentence level with continued low vocal intensity with SLP sitting near pt. Pt was left in room with call bell within reach and chair alarm set. ST recommends to continue skilled ST services.      Pain Pain Assessment Pain Scale: Faces Pain Score: 0-No pain Faces Pain Scale: Hurts little more Pain Type: Acute pain Pain Location: Shoulder Pain Orientation: Left Pain Descriptors / Indicators: Aching;Discomfort Pain Onset: On-going Patients Stated Pain Goal: 0 Pain Intervention(s): Medication (See eMAR)(baclofen, voltaren gel)  Therapy/Group: Individual Therapy  Crispin Vogel  [facility] 10/25/2018, 12:24 PM

## 2018-10-25 NOTE — Progress Notes (Signed)
[facility] PHYSICAL MEDICINE & REHABILITATION PROGRESS NOTE   Subjective/Complaints:  No issues overnite except dry mouth,   ROS: Patient denies+ dry mouth, no abd pain, no N/V/D   Objective:   No results found. No results for input(s): WBC, HGB, HCT, PLT in the last 72 hours. No results for input(s): NA, K, CL, CO2, GLUCOSE, BUN, CREATININE, CALCIUM in the last 72 hours.  Intake/Output Summary (Last 24 hours) at 10/25/2018 0759 Last data filed at 10/24/2018 1800 Gross per 24 hour  Intake 720 ml  Output -  Net 720 ml     Physical Exam: Vital Signs Blood pressure 139/76, pulse 74, temperature 97.6 F (36.4 C), temperature source Oral, resp. rate 16, height 5\' 3"  (1.6 m), weight 72.1 kg, SpO2 98 %. Constitutional: No distress . Vital signs reviewed. HEENT: EOMI, oral membranes moist Neck: supple Cardiovascular: RRR without murmur. No JVD    Respiratory: CTA Bilaterally without wheezes or rales. Normal effort    GI: BS +, non-tender,mildly distended Musc: No edema or tenderness in extremities Neurologic: Alert Motor: Grossly 5/5 RUE/RLE LUE: Grossly 1/5 proximal distal--stable LLE: Grossly 2-/5 proximal distal--stable Tone 2/4 flexor LUE and extensor LLE Psych: pleasant.  Assessment/Plan: 1. Functional deficits secondary to right MCA infarct which require 3+ hours per day of interdisciplinary therapy in a comprehensive inpatient rehab setting.  Physiatrist is providing close team supervision and 24 hour management of active medical problems listed below.  Physiatrist and rehab team continue to assess barriers to discharge/monitor patient progress toward functional and medical goals  Care Tool:  Bathing    Body parts bathed by patient: Left arm, Abdomen, Chest, Front perineal area, Left upper leg, Right upper leg, Face   Body parts bathed by helper: Right arm, Buttocks, Right lower leg, Left lower leg     Bathing assist Assist Level: Moderate Assistance - Patient 50  - 74%     Upper Body Dressing/Undressing Upper body dressing   What is the patient wearing?: Pull over shirt    Upper body assist Assist Level: Moderate Assistance - Patient 50 - 74%    Lower Body Dressing/Undressing Lower body dressing      What is the patient wearing?: Pants, Incontinence brief     Lower body assist Assist for lower body dressing: Maximal Assistance - Patient 25 - 49%     Toileting Toileting    Toileting assist Assist for toileting: Total Assistance - Patient < 25%     Transfers Chair/bed transfer  Transfers assist  Chair/bed transfer activity did not occur: Safety/medical concerns  Chair/bed transfer assist level: Dependent - mechanical lift     Locomotion Ambulation   Ambulation assist   Ambulation activity did not occur: Safety/medical concerns  Assist level: Moderate Assistance - Patient 50 - 74% Assistive device: Walker-rolling Max distance: 15   Walk 10 feet activity   Assist  Walk 10 feet activity did not occur: Safety/medical concerns  Assist level: Moderate Assistance - Patient - 50 - 74% Assistive device: Walker-rolling   Walk 50 feet activity   Assist Walk 50 feet with 2 turns activity did not occur: Safety/medical concerns         Walk 150 feet activity   Assist Walk 150 feet activity did not occur: Safety/medical concerns         Walk 10 feet on uneven surface  activity   Assist Walk 10 feet on uneven surfaces activity did not occur: Safety/medical concerns  Wheelchair     Assist Will patient use wheelchair at discharge?: Yes Type of Wheelchair: Manual    Wheelchair assist level: Minimal Assistance - Patient > 75% Max wheelchair distance: 150    Wheelchair 50 feet with 2 turns activity    Assist        Assist Level: Supervision/Verbal cueing   Wheelchair 150 feet activity     Assist Wheelchair 150 feet activity did not occur: Safety/medical concerns   Assist  Level: Minimal Assistance - Patient > 75%    Medical Problem List and Plan: 1.Lefthemiparesis, dysphagia, and visual-spatial deficitssecondary to right MCA infarction, now with spasticity  Continue CIR  Baclofen  Increased to 10mg TID--she is tolerating with some fatigue. Likely unable to titrate much further.Plan  Botulinum toxin  as outpt  Continue WHO/PRAFO and ROM with therapies 2. Antithrombotics: -DVT/anticoagulation:Continue Eliquis -antiplatelet therapy: N/A 3. Pain Management:Tylenol as needed- added topamax for HA with some benefit increased to BID-improving -maintain proper shoulder position and support when in bed and chair.    -Voltaren gel  -added tramadol 50mg  q12 prn for severe pain 4. Mood:Provide emotional support -antipsychotic agents: N/A  -neuro-psych input would be helpful  -likely some disinhibition related to location of CVA  -?reactive anxiety/depression also an issue--add prn xanax low dose 5. Neuropsych: This patientiscapable of making decisions on herown behalf. 6. Skin/Wound Care:Routine skin checks. Aquacel, gen surg evaled there is no evidence of infection but some necrotic tissue noted.  May benefit from hydro therapy  7. Fluids/Electrolytes/Nutrition:Routine in and outs 8.Atrial fibrillation. Amiodarone 200 mg twice daily, Lanoxin 0.125 mg daily, Cardizem 90 mg 4 times daily.   Vitals:   10/24/18 1930 10/25/18 0432  BP: 121/69 139/76  Pulse: 64 74  Resp: 16 16  Temp: 98.1 F (36.7 C) 97.6 F (36.4 C)  SpO2: 99% 98%   HR controlled, 8/10  9.  Post strokedysphagia. Dysphagia #2, thin liquids  Advance diet per SLP 10. Diabetes mellitus. Hemoglobin A1c 6.1. SSI. Patient on Glucophage 500 mg twice daily prior to admission. Resume as indicated CBG (last 3)  Recent Labs    10/24/18 1712 10/24/18 2107 10/25/18 0616  GLUCAP 121* 130* 138*  Controlled 8/10 11.Rhabdomyolysis. no sign  of renal failure  12. Hyperlipidemia. Lipitor 13. Constipation. Laxative assistance, goes freq but not emptying much, check KUB 14.  Leukocytosis: Resolved  not febrile, ua equivocal, ucx multispecies  WBCs 6.1      15.  Hypoalbuminemia  Supplement initiated 16.  AKI  Creatinine 1.01 on 8.4     17. Urinary frequency: had some at baseline  -PVR's generally low  Dry mouth but reduced urgency ditropan to 5mg   TID ,try xl formulation -UA negative   LOS: 28 days A FACE TO FACE EVALUATION WAS PERFORMED  Charlett Blake 10/25/2018, 7:59 AM

## 2018-10-25 NOTE — Plan of Care (Signed)
  Problem: RH BLADDER ELIMINATION Goal: RH STG MANAGE BLADDER WITH ASSISTANCE Description: STG Manage Bladder With Assistance - Mod Outcome: Progressing   Problem: RH SKIN INTEGRITY Goal: RH STG MAINTAIN SKIN INTEGRITY WITH ASSISTANCE Description: STG Maintain Skin Integrity With Assistance. Mod Outcome: Progressing Goal: RH STG ABLE TO PERFORM INCISION/WOUND CARE W/ASSISTANCE Description: STG Able To Perform Incision/Wound Care With Assistance. Total assist from caregiver  Outcome: Progressing   Problem: RH SAFETY Goal: RH STG ADHERE TO SAFETY PRECAUTIONS W/ASSISTANCE/DEVICE Description: STG Adhere to Safety Precautions With Assistance/Device. Mod Outcome: Progressing   Problem: RH PAIN MANAGEMENT Goal: RH STG PAIN MANAGED AT OR BELOW PT'S PAIN GOAL Description: Less than 4 Outcome: Progressing   Problem: RH KNOWLEDGE DEFICIT Goal: RH STG INCREASE KNOWLEDGE OF DIABETES Description: Patient/family will be able to verbalize target blood sugar values, medication and diet to control T2DM with cues/handouts Outcome: Progressing Goal: RH STG INCREASE KNOWLEDGE OF HYPERTENSION Description: Patient/Family will be able to verbalize target BP, interpreting blood pressure readings, medication and diet to control HTN with cues/handouts Outcome: Progressing Goal: RH STG INCREASE KNOWLEDGE OF DYSPHAGIA/FLUID INTAKE Description: Patient/family will be able to demonstrate safe swallowing techniques and appropriate consistency for food and drink with cues/handouts Outcome: Progressing   

## 2018-10-26 ENCOUNTER — Inpatient Hospital Stay (HOSPITAL_COMMUNITY): Payer: Medicare Other | Admitting: Occupational Therapy

## 2018-10-26 ENCOUNTER — Inpatient Hospital Stay (HOSPITAL_COMMUNITY): Payer: Medicare Other

## 2018-10-26 ENCOUNTER — Inpatient Hospital Stay (HOSPITAL_COMMUNITY): Payer: Medicare Other | Admitting: Physical Therapy

## 2018-10-26 LAB — GLUCOSE, CAPILLARY
Glucose-Capillary: 109 mg/dL — ABNORMAL HIGH (ref 70–99)
Glucose-Capillary: 105 mg/dL — ABNORMAL HIGH (ref 70–99)
Glucose-Capillary: 148 mg/dL — ABNORMAL HIGH (ref 70–99)
Glucose-Capillary: 82 mg/dL (ref 70–99)

## 2018-10-26 MED ORDER — LIP MEDEX EX OINT
TOPICAL_OINTMENT | CUTANEOUS | Status: DC | PRN
  Filled 2018-10-26: qty 7

## 2018-10-26 MED ORDER — OXYBUTYNIN CHLORIDE ER 10 MG PO TB24
10.0000 mg | ORAL_TABLET | Freq: Every day | ORAL | Status: DC
  Administered 2018-10-26: 10 mg via ORAL
  Filled 2018-10-26: qty 1

## 2018-10-26 NOTE — Progress Notes (Signed)
Occupational Therapy Session Note  Patient Details  Name: Rachel Fox MRN: [id number] Date of Birth: 1946/02/24  Today's Date: 10/26/2018 OT Individual Time: 1050-1200 and 9147-8295 OT Individual Time Calculation (min): 70 min and 20 mins   Short Term Goals: Week 4:  OT Short Term Goal 1 (Week 4): STGs=LTGs secondary to upcoming discharge  Skilled Therapeutic Interventions/Progress Updates:    Session 1: Upon entering the room, pt in bathroom with NT getting ready to transfer to toilet. Pt needing max A for stand pivot transfer. Pt unable to void this session and was also unaware that she had not voided. Pt standing with mod A for balance while therapist performed total A for hygiene and clothing management. Pt returning to sink via wheelchair to wash hands with min A to incorporate L UE into task as well. Pt verbalized, " I feel like there is still stuff in my mouth." OT assisted pt with set up of suction toothbrush and then pt rinsing several additional times at the sink. Pt needing mod cuing throughout session for head turn and to locate items on L side. Pt remained in wheelchair with chair alarm belt donned and call bell within reach. OT discussed current goals and need for family education or possibility additional therapy at another facility if family was unable to provide care. Pt verbalized understanding and agreement.   Session 2: SLP exiting the room and pt transitioning into OT session. SLP reports pt having very emotional session as she deals with "hard truths" of what she needs assistance with at this time. Pt appears very flat this session and demands to return to bed. Stand pivot transfer from wheelchair >bed with mod A. Pt required min A sitting balance while therapist removing pt's B shoes. Sit >supine with max A for safety. OT doffing B TED hose.  Pt very emotional and crying in the bed. She refuses to talk to therapist and turns head away. Call bell and all needed items within  reach. OT repositioned pt in bed for comfort to protect the hemiplegic side and wound on buttocks.   Therapy Documentation Precautions:  Precautions Precautions: Fall Precaution Comments: L side weak/flaccid, right gaze preference, L shoulder pain (risk for subluxation), pressure injury from laying on floor PTA Restrictions Weight Bearing Restrictions: No   Therapy/Group: Individual Therapy  Gypsy Decant 10/26/2018, 12:22 PM

## 2018-10-26 NOTE — Progress Notes (Addendum)
[facility] PHYSICAL MEDICINE & REHABILITATION PROGRESS NOTE   Subjective/Complaints:  No issues overnite except for constipation  KUB noted   ROS: Patient denies+ dry mouth, no abd pain, no N/V/D   Objective:   Dg Abd 1 View  Result Date: 10/25/2018 CLINICAL DATA:  Constipation. EXAM: ABDOMEN - 1 VIEW COMPARISON:  None. FINDINGS: The bowel gas pattern is normal. Moderate amount of stool is seen in the right and left colon. No radio-opaque calculi or other significant radiographic abnormality are seen. IMPRESSION: No evidence of bowel obstruction or ileus. Moderate stool burden is noted. Electronically Signed   By: Lupita RaiderJames  Green Jr M.D.   On: 10/25/2018 09:54   No results for input(s): WBC, HGB, HCT, PLT in the last 72 hours. Recent Labs    10/25/18 0703  NA 147*  K 4.2  CL 111  CO2 26  GLUCOSE 133*  BUN 22  CREATININE 1.24*  CALCIUM 10.1    Intake/Output Summary (Last 24 hours) at 10/26/2018 0800 Last data filed at 10/26/2018 0210 Gross per 24 hour  Intake 320 ml  Output 250 ml  Net 70 ml     Physical Exam: Vital Signs Blood pressure 138/65, pulse 60, temperature 97.8 F (36.6 C), temperature source Oral, resp. rate 18, height 5\' 3"  (1.6 m), weight 72.1 kg, SpO2 100 %. Constitutional: No distress . Vital signs reviewed. HEENT: EOMI, oral membranes moist Neck: supple Cardiovascular: RRR without murmur. No JVD    Respiratory: CTA Bilaterally without wheezes or rales. Normal effort    GI: BS +, non-tender,mildly distended Musc: No edema or tenderness in extremities Neurologic: Alert Motor: Grossly 5/5 RUE/RLE LUE: Grossly 1/5 proximal distal-- LLE: Grossly 2-/5 proximal distal-- Tone 2/4 flexor LUE and extensor LLE Psych: labile this am  Decub 8/11 top and 7/29 bottom m      Assessment/Plan: 1. Functional deficits secondary to right MCA infarct which require 3+ hours per day of interdisciplinary therapy in a comprehensive inpatient rehab  setting.  Physiatrist is providing close team supervision and 24 hour management of active medical problems listed below.  Physiatrist and rehab team continue to assess barriers to discharge/monitor patient progress toward functional and medical goals  Care Tool:  Bathing    Body parts bathed by patient: Left arm, Abdomen, Chest, Front perineal area, Left upper leg, Right upper leg, Face   Body parts bathed by helper: Right arm, Buttocks, Right lower leg, Left lower leg     Bathing assist Assist Level: Moderate Assistance - Patient 50 - 74%     Upper Body Dressing/Undressing Upper body dressing   What is the patient wearing?: Pull over shirt    Upper body assist Assist Level: Moderate Assistance - Patient 50 - 74%    Lower Body Dressing/Undressing Lower body dressing      What is the patient wearing?: Pants, Incontinence brief     Lower body assist Assist for lower body dressing: Maximal Assistance - Patient 25 - 49%     Toileting Toileting    Toileting assist Assist for toileting: Total Assistance - Patient < 25%(using Stedy)     Transfers Chair/bed transfer  Transfers assist  Chair/bed transfer activity did not occur: Safety/medical concerns  Chair/bed transfer assist level: Maximal Assistance - Patient 25 - 49%     Locomotion Ambulation   Ambulation assist   Ambulation activity did not occur: Safety/medical concerns  Assist level: Moderate Assistance - Patient 50 - 74% Assistive device: Walker-rolling Max distance: 15   Walk  10 feet activity   Assist  Walk 10 feet activity did not occur: Safety/medical concerns  Assist level: Moderate Assistance - Patient - 50 - 74% Assistive device: Walker-rolling   Walk 50 feet activity   Assist Walk 50 feet with 2 turns activity did not occur: Safety/medical concerns         Walk 150 feet activity   Assist Walk 150 feet activity did not occur: Safety/medical concerns         Walk 10 feet on  uneven surface  activity   Assist Walk 10 feet on uneven surfaces activity did not occur: Safety/medical concerns         Wheelchair     Assist Will patient use wheelchair at discharge?: Yes Type of Wheelchair: Manual    Wheelchair assist level: Minimal Assistance - Patient > 75% Max wheelchair distance: 150    Wheelchair 50 feet with 2 turns activity    Assist        Assist Level: Supervision/Verbal cueing   Wheelchair 150 feet activity     Assist Wheelchair 150 feet activity did not occur: Safety/medical concerns   Assist Level: Minimal Assistance - Patient > 75%    Medical Problem List and Plan: 1.Lefthemiparesis, dysphagia, and visual-spatial deficitssecondary to right MCA infarction, now with spasticity  Continue CIR  Baclofen  Increased to 10mg TID--she is tolerating with some fatigue, pt wants to stop med which is making her tired  . Likely unable to titrate much further.Plan  Botulinum toxin  as outpt  Team conf in am  2. Antithrombotics: -DVT/anticoagulation:Continue Eliquis -antiplatelet therapy: N/A 3. Pain Management:Tylenol as needed-  topamax for HA with some benefit increased to BID-improving -maintain proper shoulder position and support when in bed and chair.    -Voltaren gel  -added tramadol 50mg  q12 prn for severe pain 4. Mood:Provide emotional support -antipsychotic agents: N/A  -neuro-psych input would be helpful  -likely some disinhibition related to location of CVA  -?reactive anxiety/depression also an issue--add prn xanax low dose 5. Neuropsych: This patientiscapable of making decisions on herown behalf. 6. Skin/Wound Care:Routine skin checks. Aquacel, gen surg evaled there is no evidence of infection but some necrotic tissue noted.  May benefit from hydro therapy  7. Fluids/Electrolytes/Nutrition:Routine in and outs 8.Atrial fibrillation. Amiodarone 200 mg twice daily,  Lanoxin 0.125 mg daily, Cardizem 90 mg 4 times daily.   Vitals:   10/25/18 1937 10/26/18 0433  BP: 138/60 138/65  Pulse: (!) 59 60  Resp: 18 18  Temp: 97.9 F (36.6 C) 97.8 F (36.6 C)  SpO2: 100% 100%   HR controlled, 8/10  9.  Post strokedysphagia. Dysphagia #2, thin liquids  Advance diet per SLP 10. Diabetes mellitus. Hemoglobin A1c 6.1. SSI. Patient on Glucophage 500 mg twice daily prior to admission. Resume as indicated CBG (last 3)  Recent Labs    10/25/18 1641 10/25/18 2115 10/26/18 0600  GLUCAP 109* 83 109*  Controlled 8/11 11.Rhabdomyolysis. no sign of renal failure  12. Hyperlipidemia. Lipitor 13. Constipation. Laxative assistance, goes freq but not emptying much, check KUB 14.  Leukocytosis: Resolved  not febrile, ua equivocal, ucx multispecies  WBCs 6.1      15.  Hypoalbuminemia  Supplement initiated 16.  AKI  Creatinine 1.01 on 8.4     17. Urinary frequency: had some at baseline  -PVR's generally low  Dry mouth but reduced urgency ditropan to 5mg   TID ,try xl formulation increasse to 10mg      LOS: 29 days A FACE  TO FACE EVALUATION WAS PERFORMED  Charlett Blake 10/26/2018, 8:00 AM

## 2018-10-26 NOTE — Progress Notes (Signed)
Speech Language Pathology Daily Session Note  Patient Details  Name: Rachel Fox MRN: [id number] Date of Birth: Nov 30, 1945  Today's Date: 10/26/2018 SLP Individual Time: 7106-2694 SLP Individual Time Calculation (min): 28 min  Short Term Goals: Week 4: SLP Short Term Goal 1 (Week 4): Patient will demonstate efficient mastication and complete oral clearance with trials of dys. 3 textures with Min A verbal cues for utilization of compensatory strategies. SLP Short Term Goal 2 (Week 4): Patient will utilize speech intelligibility strategies at the sentence level with Min A verbal cues to achieve ~70% intelligibility. SLP Short Term Goal 3 (Week 4): Patient will demonstrate problem solving in semi-complex tasks with Supervision verbal cues. SLP Short Term Goal 4 (Week 4): Patient will self-monitor and correct errors during semi-complex problem solving tasks with Supervision verbal and visual cues. SLP Short Term Goal 5 (Week 4): Patient will utilize memory compensatory strategies to recall new, daily information with supervision A verbal cues.  Skilled Therapeutic Interventions:Skilled ST services focused on cognitive skills. SLP facilitated semi-complex verbal problem solving and anticipatory awareness skills given household situtations, pt required Mod A verbal cues for problem solving and max A verbal cues for anticipatory awareness. Pt denied needing assistance upon discharge with medications, finances and requiring 24 hour supervision. Follow extensive education pt reluctantly agreed that assistance is required upon discharge. Pt was left in room with call bell within reach and chair alarm set. ST recommends to continue skilled ST services.      Pain Pain Assessment Pain Scale: 0-10 Pain Score: 0-No pain Pain Type: Acute pain Pain Location: Head Pain Descriptors / Indicators: Headache Pain Frequency: Occasional Pain Onset: Unable to tell Patients Stated Pain Goal: 4 Pain  Intervention(s): Medication (See eMAR)  Therapy/Group: Individual Therapy  Zeynep Fantroy  [facility] 10/26/2018, 3:42 PM

## 2018-10-26 NOTE — Progress Notes (Addendum)
Physical Therapy Session Note  Patient Details  Name: Rachel Fox MRN: [id number] Date of Birth: 08/12/1945  Today's Date: 10/26/2018 PT Individual Time: 5035-4656 PT Individual Time Calculation (min): 40 min   Short Term Goals: Week 4:  PT Short Term Goal 1 (Week 4): STG=LTG due to ELOS  Skilled Therapeutic Interventions/Progress Updates:  Pt received in bed & agreeable to tx. Therapist provides total assist for donning thigh high teds, shoes & pants from bed level with pt rolling L<>R with bed rails but unable to roll completely to sidelying. Pt transfers supine>sit with mod assist and becomes emotional re: progress, fatigue during the day, & desire to go home with therapist providing encouragement. Pt reports need to use restroom so transfers sit>stand in stedy with min assist and sit<>stand from stedy<>elevated seat over [facility] with therapist managing clothing dependently. Pt with continent void and is able to sit on toilet with distant supervision with support from stedy. Pt performs frontal peri hygiene with mod<>max assist for standing balance in stedy. Pt performs hand hygiene while sitting in stedy with cuing for midline orientation with mirror for visual feedback, therapist providing total assist to elevate LUE to allow pt to wash hand. Pt transferred to w/c and transported to/from gym via w/c dependent assist for time management. In dayroom, pt performs sit<>stand x 3 with armrests and max assist with therapist educating pt on pushing L with R side. Therapist provides cuing for anterior weight shifting during transitional movement then cuing to activate BLE hips/glutes & anterior pelvic shift to correct posterior lean with only fair return demo. Pt becoming tearful again with therapist providing encouragement. At end of session pt left in w/c with chair alarm donned & call bell in reach.   Addendum: No formal c/o pain reported during session.   Therapy Documentation Precautions:   Precautions Precautions: Fall Precaution Comments: L side weak/flaccid, right gaze preference, L shoulder pain (risk for subluxation), pressure injury from laying on floor PTA Restrictions Weight Bearing Restrictions: No    Therapy/Group: Individual Therapy  Waunita Schooner 10/26/2018, 10:09 AM

## 2018-10-27 ENCOUNTER — Inpatient Hospital Stay (HOSPITAL_COMMUNITY): Payer: Medicare Other | Admitting: Occupational Therapy

## 2018-10-27 ENCOUNTER — Inpatient Hospital Stay (HOSPITAL_COMMUNITY): Payer: Medicare Other | Admitting: Physical Therapy

## 2018-10-27 ENCOUNTER — Inpatient Hospital Stay (HOSPITAL_COMMUNITY): Payer: Medicare Other

## 2018-10-27 LAB — GLUCOSE, CAPILLARY
Glucose-Capillary: 100 mg/dL — ABNORMAL HIGH (ref 70–99)
Glucose-Capillary: 107 mg/dL — ABNORMAL HIGH (ref 70–99)
Glucose-Capillary: 131 mg/dL — ABNORMAL HIGH (ref 70–99)
Glucose-Capillary: 129 mg/dL — ABNORMAL HIGH (ref 70–99)

## 2018-10-27 MED ORDER — OXYBUTYNIN CHLORIDE ER 15 MG PO TB24
15.0000 mg | ORAL_TABLET | Freq: Every day | ORAL | Status: DC
  Administered 2018-10-27 – 2018-10-30 (×4): 15 mg via ORAL
  Filled 2018-10-27 (×4): qty 1

## 2018-10-27 NOTE — Progress Notes (Signed)
Physical Therapy Session Note  Patient Details  Name: Rachel Fox MRN: [id number] Date of Birth: 02/21/1946  Today's Date: 10/27/2018 PT Individual Time: 1304-1400 PT Individual Time Calculation (min): 56 min   Short Term Goals: Week 4:  PT Short Term Goal 1 (Week 4): STG=LTG due to ELOS  Skilled Therapeutic Interventions/Progress Updates: Pt presented in bed eating lunch with NT present. PTA supervised pt to complete meal with PTA minimizing distractions. Pt then performed supine to sit to L side of bed with modA and heavy use of bed features. Performed squat pivot to w/c to R with modA. Remaining session focused on w/c mobility with hemi technique. Pt propelled in dayroom with minA on straight paths requiring mod to max verbal cues to use RLE and avoiding objects on L. As pt fatigued pt required increased time for sequencing and would veer more to L and noted increased difficulty correcting w/c if turned towards L. Pt was able to propel back to room and transported to bedside once in room. Performed squat pivot to bed to L modA and performed sit to supine with modA with pt able to place LLE onto bed without assist. Pt required minA for repositioning and left in bed with call bell within reach and needs met.      Therapy Documentation Precautions:  Precautions Precautions: Fall Precaution Comments: L side weak/flaccid, right gaze preference, L shoulder pain (risk for subluxation), pressure injury from laying on floor PTA Restrictions Weight Bearing Restrictions: No General:   Vital Signs: Therapy Vitals Temp: 97.8 F (36.6 C) Temp Source: Oral Pulse Rate: 63 Resp: 18 BP: (!) 146/62 Patient Position (if appropriate): Lying Oxygen Therapy SpO2: 100 % O2 Device: Room Air   Therapy/Group: Individual Therapy      , PTA  10/27/2018, 3:50 PM  

## 2018-10-27 NOTE — Progress Notes (Signed)
[facility] PHYSICAL MEDICINE & REHABILITATION PROGRESS NOTE   Subjective/Complaints:  HA relieved with meds, Still having problems with urinary freq and dry mouth   ROS: Patient denies+ dry mouth, no abd pain, no N/V/D   Objective:   Dg Abd 1 View  Result Date: 10/25/2018 CLINICAL DATA:  Constipation. EXAM: ABDOMEN - 1 VIEW COMPARISON:  None. FINDINGS: The bowel gas pattern is normal. Moderate amount of stool is seen in the right and left colon. No radio-opaque calculi or other significant radiographic abnormality are seen. IMPRESSION: No evidence of bowel obstruction or ileus. Moderate stool burden is noted. Electronically Signed   By: Marijo Conception M.D.   On: 10/25/2018 09:54   No results for input(s): WBC, HGB, HCT, PLT in the last 72 hours. Recent Labs    10/25/18 0703  NA 147*  K 4.2  CL 111  CO2 26  GLUCOSE 133*  BUN 22  CREATININE 1.24*  CALCIUM 10.1    Intake/Output Summary (Last 24 hours) at 10/27/2018 0757 Last data filed at 10/26/2018 1816 Gross per 24 hour  Intake 417 ml  Output -  Net 417 ml     Physical Exam: Vital Signs Blood pressure (!) 141/71, pulse 63, temperature 98.5 F (36.9 C), temperature source Oral, resp. rate 16, height 5' 3" (1.6 m), weight 72.1 kg, SpO2 100 %. Constitutional: No distress . Vital signs reviewed. HEENT: EOMI, oral membranes moist Neck: supple Cardiovascular: RRR without murmur. No JVD    Respiratory: CTA Bilaterally without wheezes or rales. Normal effort    GI: BS +, non-tender,mildly distended Musc: No edema or tenderness in extremities Neurologic: Alert Motor: Grossly 5/5 RUE/RLE LUE: Grossly 1/5 proximal distal-- LLE: Grossly 2-/5 proximal distal-- Tone 2/4 flexor LUE and extensor LLE Psych: labile this am  Decub 8/11 top and 7/29 bottom m      Assessment/Plan: 1. Functional deficits secondary to right MCA infarct which require 3+ hours per day of interdisciplinary therapy in a comprehensive inpatient rehab  setting.  Physiatrist is providing close team supervision and 24 hour management of active medical problems listed below.  Physiatrist and rehab team continue to assess barriers to discharge/monitor patient progress toward functional and medical goals  Care Tool:  Bathing    Body parts bathed by patient: Left arm, Abdomen, Chest, Front perineal area, Left upper leg, Right upper leg, Face   Body parts bathed by helper: Right arm, Buttocks, Right lower leg, Left lower leg     Bathing assist Assist Level: Moderate Assistance - Patient 50 - 74%     Upper Body Dressing/Undressing Upper body dressing   What is the patient wearing?: Pull over shirt    Upper body assist Assist Level: Moderate Assistance - Patient 50 - 74%    Lower Body Dressing/Undressing Lower body dressing      What is the patient wearing?: Pants, Incontinence brief     Lower body assist Assist for lower body dressing: Maximal Assistance - Patient 25 - 49%     Toileting Toileting    Toileting assist Assist for toileting: Total Assistance - Patient < 25%     Transfers Chair/bed transfer  Transfers assist  Chair/bed transfer activity did not occur: Safety/medical concerns  Chair/bed transfer assist level: Maximal Assistance - Patient 25 - 49%     Locomotion Ambulation   Ambulation assist   Ambulation activity did not occur: Safety/medical concerns  Assist level: Moderate Assistance - Patient 50 - 74% Assistive device: Walker-rolling Max distance: 15  Walk 10 feet activity   Assist  Walk 10 feet activity did not occur: Safety/medical concerns  Assist level: Moderate Assistance - Patient - 50 - 74% Assistive device: Walker-rolling   Walk 50 feet activity   Assist Walk 50 feet with 2 turns activity did not occur: Safety/medical concerns         Walk 150 feet activity   Assist Walk 150 feet activity did not occur: Safety/medical concerns         Walk 10 feet on uneven  surface  activity   Assist Walk 10 feet on uneven surfaces activity did not occur: Safety/medical concerns         Wheelchair     Assist Will patient use wheelchair at discharge?: Yes Type of Wheelchair: Manual    Wheelchair assist level: Minimal Assistance - Patient > 75% Max wheelchair distance: 150    Wheelchair 50 feet with 2 turns activity    Assist        Assist Level: Supervision/Verbal cueing   Wheelchair 150 feet activity     Assist Wheelchair 150 feet activity did not occur: Safety/medical concerns   Assist Level: Minimal Assistance - Patient > 75%    Medical Problem List and Plan: 1.Lefthemiparesis, dysphagia, and visual-spatial deficitssecondary to right MCA infarction, now with spasticity  Continue CIR  Baclofen  Increased to 33mTID--she is tolerating with some fatigue, pt wants to stop med which is making her tired  . Likely unable to titrate much further.Plan  Botulinum toxin  as outpt  Team conference today please see physician documentation under team conference tab, met with team face-to-face to discuss problems,progress, and goals. Formulized individual treatment plan based on medical history, underlying problem and comorbidities. 2. Antithrombotics: -DVT/anticoagulation:Continue Eliquis -antiplatelet therapy: N/A 3. Pain Management:Tylenol as needed-  topamax for HA with some benefit increased to BID-improving -maintain proper shoulder position and support when in bed and chair.    -Voltaren gel  -added tramadol [id number] prn for severe pain 4. Mood:Provide emotional support -antipsychotic agents: N/A  -neuro-psych input would be helpful  -likely some disinhibition related to location of CVA  -?reactive anxiety/depression also an issue--add prn xanax low dose 5. Neuropsych: This patientiscapable of making decisions on herown behalf. 6. Skin/Wound Care:Routine skin checks. Aquacel,  gen surg evaled there is no evidence of infection but some necrotic tissue noted.  May benefit from hydro therapy  7. Fluids/Electrolytes/Nutrition:Routine in and outs 8.Atrial fibrillation. Amiodarone 200 mg twice daily, Lanoxin 0.125 mg daily, Cardizem 90 mg 4 times daily.   Vitals:   10/26/18 2006 10/27/18 0524  BP: 122/65 (!) 141/71  Pulse: (!) 57 63  Resp: 16 16  Temp: 97.6 F (36.4 C) 98.5 F (36.9 C)  SpO2: 90% 100%   HR controlled, 8/12  9.  Post strokedysphagia. Dysphagia #2, thin liquids  Advance diet per SLP 10. Diabetes mellitus. Hemoglobin A1c 6.1. SSI. Patient on Glucophage 500 mg twice daily prior to admission. Resume as indicated CBG (last 3)  Recent Labs    10/26/18 1639 10/26/18 2104 10/27/18 0608  GLUCAP 148* 82 100*  Controlled 8/12 11.Rhabdomyolysis. no sign of renal failure  12. Hyperlipidemia. Lipitor 13. Constipation. Laxative assistance, goes freq but not emptying much, check KUB 14.  Leukocytosis: Resolved  not febrile, ua equivocal, ucx multispecies  WBCs 6.1      15.  Hypoalbuminemia  Supplement initiated 16.  AKI  Creatinine 1.01 on 8.4     17. Urinary frequency: had some at baseline  -  PVR's generally low  Dry mouth but reduced urgency ditropan to 25m  TID ,try xl formulation increasse to 163m    LOS: 30 days A FACE TO FACranston Kirsteins 10/27/2018, 7:57 AM

## 2018-10-27 NOTE — Progress Notes (Signed)
Occupational Therapy Session Note  Patient Details  Name: Rachel Fox MRN: [id number] Date of Birth: Nov 17, 1945  Today's Date: 10/27/2018 OT Individual Time: [phone] and 1515-1600 OT Individual Time Calculation (min): 73 min and 60 mins   Short Term Goals: Week 4:  OT Short Term Goal 1 (Week 4): STGs=LTGs secondary to upcoming discharge  Skilled Therapeutic Interventions/Progress Updates:    Session 1:Upon entering the room, pt supine in bed with NT present in room and starting on breakfast. OT provided supervision and eliminated distractions in the room for safety. Pt needing cuing to locate items on the far L of tray. Pt utilized spoon and finger to scoop and check L cheek for pocketing. Pt needing assistance with use of suction to fully clean mouth. Supine >sit with mod A to EOB. Max A stand pivot transfer to the L into wheelchair. Pt seated in wheelchair at sink for grooming tasks at supervision level and min cuing to locate items on L side of sink. Pt's tone in L UE making it very hard to perform hygiene and unable to do HHA to utilize with self care. Pt standing with mod A at sink and L knee blocked for max A for LB clothing management. Pt remained in wheelchair at end of session with chair alarm donned and activated. L UE lap tray in place. Call bell within reach  Session 2: Upon entering the room, pt supine in bed with no c/o pain. RN enters and requests assistance with pt positioning to change dressing on buttocks. Supine>sit with mod A to EOB. Pt standing in stedy for 6 minutes and then 5 minutes respectively while RN removes dressing, cleans, measures, and changes dressing. Pt required mod A for midline orientation as she displays L lateral lean in standing. Pt also reporting urge for toileting while standing and female urinal placed up to pt but she does not void. After dressing change, OT assists pt via stedy secondary to urgency to toilet. Pt sitting for 20 minutes but unable to  void. Pt standing with max A for toilet height into stedy and total A for LB clothing management. Pt returning to bed at end of session. Call bell and all needed items within reach upon exiting the room.   Therapy Documentation Precautions:  Precautions Precautions: Fall Precaution Comments: L side weak/flaccid, right gaze preference, L shoulder pain (risk for subluxation), pressure injury from laying on floor PTA Restrictions Weight Bearing Restrictions: No General:   Vital Signs: Therapy Vitals Temp: 98.5 F (36.9 C) Temp Source: Oral Pulse Rate: 69(apical) Resp: 16 BP: (!) 145/78 Patient Position (if appropriate): Lying Oxygen Therapy SpO2: 100 % O2 Device: Room Air Pain: Pain Assessment Pain Scale: 0-10 Pain Score: 0-No pain ADL:   Vision   Perception    Praxis   Exercises:   Other Treatments:     Therapy/Group: Individual Therapy  Gypsy Decant 10/27/2018, 9:16 AM

## 2018-10-27 NOTE — Progress Notes (Signed)
Speech Language Pathology Daily Session Note  Patient Details  Name: Rachel Fox MRN: [id number] Date of Birth: 11-20-1945  Today's Date: 10/27/2018 SLP Individual Time: 1000-1030 SLP Individual Time Calculation (min): 30 min  Short Term Goals: Week 4: SLP Short Term Goal 1 (Week 4): Patient will demonstate efficient mastication and complete oral clearance with trials of dys. 3 textures with Min A verbal cues for utilization of compensatory strategies. SLP Short Term Goal 2 (Week 4): Patient will utilize speech intelligibility strategies at the sentence level with Min A verbal cues to achieve ~70% intelligibility. SLP Short Term Goal 3 (Week 4): Patient will demonstrate problem solving in semi-complex tasks with Supervision verbal cues. SLP Short Term Goal 4 (Week 4): Patient will self-monitor and correct errors during semi-complex problem solving tasks with Supervision verbal and visual cues. SLP Short Term Goal 5 (Week 4): Patient will utilize memory compensatory strategies to recall new, daily information with supervision A verbal cues.  Skilled Therapeutic Interventions: Skilled ST services focused on cognitive skills. Pt requested to use bathroom, SLP assisted with transfer via stedy requiring mod-min physical A. SLP facilitated semi-complex problem solving skills in familiar PEG design task, pt required initial verbal cues for planning fading to min A verbal cues. Pt demonstrated increase in error awareness and correction from pervious sessions. Pt was left in room with call bell within reach and chair alarm set. ST recommends to continue skilled ST services.      Pain Pain Assessment Pain Score: 3   Therapy/Group: Individual Therapy  Taija Mathias  [facility] 10/27/2018, 8:01 AM

## 2018-10-28 ENCOUNTER — Ambulatory Visit (HOSPITAL_COMMUNITY): Payer: Medicare Other | Admitting: Physical Therapy

## 2018-10-28 ENCOUNTER — Encounter (HOSPITAL_COMMUNITY): Payer: Medicare Other | Admitting: Occupational Therapy

## 2018-10-28 ENCOUNTER — Encounter (HOSPITAL_COMMUNITY): Payer: Medicare Other | Admitting: Speech Pathology

## 2018-10-28 LAB — CBC
HCT: 45.7 % (ref 36.0–46.0)
Hemoglobin: 14.6 g/dL (ref 12.0–15.0)
MCH: 27.2 pg (ref 26.0–34.0)
MCHC: 31.9 g/dL (ref 30.0–36.0)
MCV: 85.3 fL (ref 80.0–100.0)
Platelets: 200 10*3/uL (ref 150–400)
RBC: 5.36 MIL/uL — ABNORMAL HIGH (ref 3.87–5.11)
RDW: 14.6 % (ref 11.5–15.5)
WBC: 8.3 10*3/uL (ref 4.0–10.5)
nRBC: 0 % (ref 0.0–0.2)

## 2018-10-28 LAB — BASIC METABOLIC PANEL
Anion gap: 7 (ref 5–15)
BUN: 16 mg/dL (ref 8–23)
CO2: 25 mmol/L (ref 22–32)
Calcium: 9.9 mg/dL (ref 8.9–10.3)
Chloride: 110 mmol/L (ref 98–111)
Creatinine, Ser: 1.1 mg/dL — ABNORMAL HIGH (ref 0.44–1.00)
GFR calc Af Amer: 58 mL/min — ABNORMAL LOW (ref >60)
GFR calc non Af Amer: 50 mL/min — ABNORMAL LOW (ref >60)
Glucose, Bld: 120 mg/dL — ABNORMAL HIGH (ref 70–99)
Potassium: 4.1 mmol/L (ref 3.5–5.1)
Sodium: 142 mmol/L (ref 135–145)

## 2018-10-28 LAB — GLUCOSE, CAPILLARY
Glucose-Capillary: 129 mg/dL — ABNORMAL HIGH (ref 70–99)
Glucose-Capillary: 133 mg/dL — ABNORMAL HIGH (ref 70–99)
Glucose-Capillary: 102 mg/dL — ABNORMAL HIGH (ref 70–99)
Glucose-Capillary: 102 mg/dL — ABNORMAL HIGH (ref 70–99)

## 2018-10-28 NOTE — NC FL2 (Signed)
[place] MEDICAID FL2 LEVEL OF CARE SCREENING TOOL     IDENTIFICATION  Patient Name: Rachel PageBarbara C Fox Birthdate: November 02, 1945 Sex: female Admission Date (Current Location): 09/27/2018  [facility] [place]   Facility and Address:  [facility], [address], [place], [place] [address]      Provider Number: [id number]  Attending Physician Name and Address:  Erick ColaceKirsteins, Andrew E, MD  Relative Name and Phone Number:       Current Level of Care: Other (Comment)(Acute Inpatient Rehab) Recommended Level of Care: Skilled Nursing Facility Prior Approval Number:    Date Approved/Denied:   PASRR Number: [id number]  Discharge Plan: SNF    Current Diagnoses: Patient Active Problem List   Diagnosis Date Noted  . Spastic hemiparesis (HCC)   . Labile blood glucose   . Acute pain of left shoulder   . AKI (acute kidney injury) (HCC)   . Controlled type 2 diabetes mellitus with hyperglycemia, without long-term current use of insulin (HCC)   . Dysphagia, post-stroke   . Atrial fibrillation (HCC)   . 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 Pressure injury of skin 09/21/2018  . Cerebrovascular accident (CVA) with involvement of left side of body (HCC) 09/20/2018  . Atrial fibrillation with RVR (HCC) 09/20/2018  . Hypertension   . Rhabdomyolysis   . Type 2 diabetes mellitus (HCC)   . Hyperlipidemia   . Hyperbilirubinemia   . Leukocytosis   . Hypernatremia     Orientation RESPIRATION BLADDER Height & Weight     Self, Time, Situation, Place  Normal Continent(use timed toileting) Weight: 158 lb 15.2 oz (72.1 kg) Height:  5\' 3"  (160 cm)  BEHAVIORAL SYMPTOMS/MOOD NEUROLOGICAL BOWEL NUTRITION STATUS      Continent Diet(Dys 2 diet with thin liquids.  Pt needs min A/supervision during meals.  Check for oral residue.)  AMBULATORY STATUS COMMUNICATION OF NEEDS Skin   Extensive Assist Verbally PU Stage and Appropriate Care(Pt with  stage 1 to heel with daily and PRN dressing change.  Pt with unstageable pressure wound to left buttocks where she laid at home until she was found.  Daily and PRN dressing change.) PU Stage 1 Dressing: Daily(PRN)                     Personal Care Assistance Level of Assistance  Bathing, Feeding, Dressing Bathing Assistance: Maximum assistance Feeding assistance: Limited assistance Dressing Assistance: Maximum assistance     Functional Limitations Info    Sight Info: Adequate Hearing Info: Adequate Speech Info: Adequate(min A for communication)    SPECIAL CARE FACTORS FREQUENCY  Blood pressure, PT (By licensed PT), OT (By licensed OT), Bowel and bladder program, Restorative feeding program, Speech therapy Blood Pressure Frequency: daily   PT Frequency: 5x/week OT Frequency: 5x/week Bowel and Bladder Program Frequency: timed toileting Restorative Feeding Program Frequency: advance pt's diet as appropriate Speech Therapy Frequency: 5x/week      Contractures Contractures Info: (Pt with increased tone in L arm and swelling in hand.  Difficult to stretch/straighten that arm.)    Additional Factors Info  Code Status, Allergies Code Status Info: Full Allergies Info: Lactose intolerance   Insulin Sliding Scale Info: novolog 0-9 units SQ three times a day with meals       Current Medications (10/28/2018):  This is the current hospital active medication list Current Facility-Administered Medications  Medication Dose Route Frequency Provider Last Rate Last Dose  . acetaminophen (TYLENOL) tablet  650 mg  650 mg Oral Q4H PRN Charlton Amorngiulli, Daniel J, PA-C   650 mg at 10/26/18 2217   Or  . acetaminophen (TYLENOL) solution 650 mg  650 mg Per Tube Q4H PRN Angiulli, Mcarthur Rossettianiel J, PA-C       Or  . acetaminophen (TYLENOL) suppository 650 mg  650 mg Rectal Q4H PRN Angiulli, Mcarthur Rossettianiel J, PA-C      . ALPRAZolam Prudy Feeler(XANAX) tablet 0.25 mg  0.25 mg Oral TID PRN Ranelle OysterSwartz, Zachary T, MD      . alum & mag  hydroxide-simeth (MAALOX/MYLANTA) 200-200-20 MG/5ML suspension 30 mL  30 mL Oral Q4H PRN Angiulli, Mcarthur Rossettianiel J, PA-C      . amiodarone (PACERONE) tablet 200 mg  200 mg Oral BID Charlton Amorngiulli, Daniel J, PA-C   200 mg at 10/28/18 0753  . apixaban (ELIQUIS) tablet 5 mg  5 mg Oral BID Charlton Amorngiulli, Daniel J, PA-C   5 mg at 10/28/18 0753  . atorvastatin (LIPITOR) tablet 80 mg  80 mg Oral q1800 Charlton Amorngiulli, Daniel J, PA-C   80 mg at 10/27/18 2205  . baclofen (LIORESAL) tablet 10 mg  10 mg Oral TID Ranelle OysterSwartz, Zachary T, MD   10 mg at 10/28/18 0753  . diclofenac sodium (VOLTAREN) 1 % transdermal gel 2 g  2 g Topical QID Marcello FennelPatel, Ankit Anil, MD   2 g at 10/28/18 0759  . digoxin (LANOXIN) tablet 0.125 mg  0.125 mg Oral Daily Charlton Amorngiulli, Daniel J, PA-C   0.125 mg at 10/28/18 0753  . diltiazem (CARDIZEM) tablet 90 mg  90 mg Oral QID Charlton Amorngiulli, Daniel J, PA-C   90 mg at 10/28/18 0753  . feeding supplement (PRO-STAT SUGAR FREE 64) liquid 30 mL  30 mL Oral BID Erick ColaceKirsteins, Andrew E, MD   30 mL at 10/28/18 0752  . guaiFENesin-dextromethorphan (ROBITUSSIN DM) 100-10 MG/5ML syrup 5 mL  5 mL Oral Q4H PRN Angiulli, Mcarthur Rossettianiel J, PA-C      . hydrocortisone (ANUSOL-HC) 2.5 % rectal cream 1 application  1 application Topical QID PRN Angiulli, Mcarthur Rossettianiel J, PA-C      . hydrocortisone cream 1 % 1 application  1 application Topical TID PRN Angiulli, Mcarthur Rossettianiel J, PA-C      . insulin aspart (novoLOG) injection 0-9 Units  0-9 Units Subcutaneous TID WC Charlton AmorAngiulli, Daniel J, PA-C   1 Units at 10/28/18 [phone]  . ipratropium-albuterol (DUONEB) 0.5-2.5 (3) MG/3ML nebulizer solution 3 mL  3 mL Nebulization Q4H PRN Angiulli, Mcarthur Rossettianiel J, PA-C      . lip balm (CARMEX) ointment   Topical PRN Jacquelynn CreeLove, Pamela S, PA-C      . oxybutynin (DITROPAN XL) 24 hr tablet 15 mg  15 mg Oral QHS Erick ColaceKirsteins, Andrew E, MD   15 mg at 10/27/18 2143  . polyethylene glycol (MIRALAX / GLYCOLAX) packet 17 g  17 g Oral Daily PRN Charlton Amorngiulli, Daniel J, PA-C   17 g at 10/25/18 1759  . polyvinyl alcohol  (LIQUIFILM TEARS) 1.4 % ophthalmic solution 1 drop  1 drop Both Eyes PRN Angiulli, Mcarthur Rossettianiel J, PA-C      . Resource ThickenUp Clear   Oral PRN Angiulli, Mcarthur RossettiDaniel J, PA-C      . senna-docusate (Senokot-S) tablet 2 tablet  2 tablet Oral QHS PRN Charlton Amorngiulli, Daniel J, PA-C   2 tablet at 10/10/18 1735  . sodium chloride (OCEAN) 0.65 % nasal spray 1 spray  1 spray Each Nare PRN Angiulli, Mcarthur Rossettianiel J, PA-C      . sorbitol 70 % solution 30 mL  30 mL Oral Daily PRN Cathlyn Parsons, PA-C   30 mL at 10/27/18 1636  . topiramate (TOPAMAX) tablet 25 mg  25 mg Oral BID Charlett Blake, MD   25 mg at 10/28/18 0753  . traMADol (ULTRAM) tablet 50 mg  50 mg Oral Q12H PRN Meredith Staggers, MD   50 mg at 10/24/18 1014     Discharge Medications: Please see discharge summary for a list of discharge medications.  Relevant Imaging Results:  Relevant Lab Results:   Additional Information SS#: [id number]  Seleste Tallman, Silvestre Mesi, LCSW

## 2018-10-28 NOTE — Progress Notes (Signed)
Patient has been trying to have a BM all day without success despite being given oral laxatives.  RN checked patient for an impaction.  Moderate amount of hard stool noted in rectal vault.  Manually disimpacted patient with minimal discomfort until only soft stool remained.  Patient continuing to attempt to have BM on commode with NT supervising.  Brita Romp, RN

## 2018-10-28 NOTE — Discharge Instructions (Signed)
Inpatient Rehab Discharge Instructions  Laverda PageBarbara C Tesoriero Discharge date and time: No discharge date for patient encounter.   Activities/Precautions/ Functional Status: Activity: activity as tolerated Diet: Dysphagia #2 thin liquids Wound Care: keep wound clean and dry Functional status:  ___ No restrictions     ___ Walk up steps independently ___ 24/7 supervision/assistance   ___ Walk up steps with assistance ___ Intermittent supervision/assistance  ___ Bathe/dress independently ___ Walk with walker     _x__ Bathe/dress with assistance ___ Walk Independently    ___ Shower independently ___ Walk with assistance    ___ Shower with assistance ___ No alcohol     ___ Return to work/school ________  Special Instructions: No driving smoking or alcohol  Home health nurse packed buttock wound with Aquacel daily using swab to field and cover with ABD pad and tape  STROKE/TIA DISCHARGE INSTRUCTIONS SMOKING Cigarette smoking nearly doubles your risk of having a stroke & is the single most alterable risk factor  If you smoke or have smoked in the last 12 months, you are advised to quit smoking for your health.  Most of the excess cardiovascular risk related to smoking disappears within a year of stopping.  Ask you doctor about anti-smoking medications  [place] Quit Line: 1-800-QUIT NOW  Free Smoking Cessation Classes [phone]  CHOLESTEROL Know your levels; limit fat & cholesterol in your diet  Lipid Panel     Component Value Date/Time   CHOL 184 09/21/2018 1120   TRIG 107 09/21/2018 1120   HDL 42 09/21/2018 1120   CHOLHDL 4.4 09/21/2018 1120   VLDL 21 09/21/2018 1120   LDLCALC 121 (H) 09/21/2018 1120      Many patients benefit from treatment even if their cholesterol is at goal.  Goal: Total Cholesterol (CHOL) less than 160  Goal:  Triglycerides (TRIG) less than 150  Goal:  HDL greater than 40  Goal:  LDL (LDLCALC) less than 100   BLOOD PRESSURE American Stroke Association  blood pressure target is less that 120/80 mm/Hg  Your discharge blood pressure is:  BP: 121/65  Monitor your blood pressure  Limit your salt and alcohol intake  Many individuals will require more than one medication for high blood pressure  DIABETES (A1c is a blood sugar average for last 3 months) Goal HGBA1c is under 7% (HBGA1c is blood sugar average for last 3 months)  Diabetes:     Lab Results  Component Value Date   HGBA1C 6.1 (H) 09/21/2018     Your HGBA1c can be lowered with medications, healthy diet, and exercise.  Check your blood sugar as directed by your physician  Call your physician if you experience unexplained or low blood sugars.  PHYSICAL ACTIVITY/REHABILITATION Goal is 30 minutes at least 4 days per week  Activity: Increase activity slowly, Therapies: Physical Therapy: Home Health Return to work:   Activity decreases your risk of heart attack and stroke and makes your heart stronger.  It helps control your weight and blood pressure; helps you relax and can improve your mood.  Participate in a regular exercise program.  Talk with your doctor about the best form of exercise for you (dancing, walking, swimming, cycling).  DIET/WEIGHT Goal is to maintain a healthy weight  Your discharge diet is:  Diet Order            DIET - DYS 1 Room service appropriate? Yes with Assist; Fluid consistency: Nectar Thick  Diet effective now  liquids Your height is:  Height: 5\' 3"  (160 cm) Your current weight is: Weight: 72.5 kg Your Body Mass Index (BMI) is:  BMI (Calculated): 28.32  Following the type of diet specifically designed for you will help prevent another stroke.  Your goal weight range is:    Your goal Body Mass Index (BMI) is 19-24.  Healthy food habits can help reduce 3 risk factors for stroke:  High cholesterol, hypertension, and excess weight.  RESOURCES Stroke/Support Group:  Call [phone]   STROKE EDUCATION PROVIDED/REVIEWED AND GIVEN  TO PATIENT Stroke warning signs and symptoms How to activate emergency medical system (call 911). Medications prescribed at discharge. Need for follow-up after discharge. Personal risk factors for stroke. Pneumonia vaccine given:  Flu vaccine given:  My questions have been answered, the writing is legible, and I understand these instructions.  I will adhere to these goals & educational materials that have been provided to me after my discharge from the hospital.      My questions have been answered and I understand these instructions. I will adhere to these goals and the provided educational materials after my discharge from the hospital.  Patient/Caregiver Signature _______________________________ Date __________  Clinician Signature _______________________________________ Date __________  Please bring this form and your medication list with you to all your follow-up doctor's appointments.

## 2018-10-28 NOTE — Plan of Care (Signed)
  Problem: RH BLADDER ELIMINATION Goal: RH STG MANAGE BLADDER WITH ASSISTANCE Description: STG Manage Bladder With Assistance - Mod Outcome: Progressing   Problem: RH SKIN INTEGRITY Goal: RH STG MAINTAIN SKIN INTEGRITY WITH ASSISTANCE Description: STG Maintain Skin Integrity With Assistance. Mod Outcome: Progressing Goal: RH STG ABLE TO PERFORM INCISION/WOUND CARE W/ASSISTANCE Description: STG Able To Perform Incision/Wound Care With Assistance. Total assist from caregiver  Outcome: Progressing   Problem: RH SAFETY Goal: RH STG ADHERE TO SAFETY PRECAUTIONS W/ASSISTANCE/DEVICE Description: STG Adhere to Safety Precautions With Assistance/Device. Mod Outcome: Progressing   Problem: RH PAIN MANAGEMENT Goal: RH STG PAIN MANAGED AT OR BELOW PT'S PAIN GOAL Description: Less than 4 Outcome: Progressing   Problem: RH KNOWLEDGE DEFICIT Goal: RH STG INCREASE KNOWLEDGE OF DIABETES Description: Patient/family will be able to verbalize target blood sugar values, medication and diet to control T2DM with cues/handouts Outcome: Progressing Goal: RH STG INCREASE KNOWLEDGE OF HYPERTENSION Description: Patient/Family will be able to verbalize target BP, interpreting blood pressure readings, medication and diet to control HTN with cues/handouts Outcome: Progressing Goal: RH STG INCREASE KNOWLEDGE OF DYSPHAGIA/FLUID INTAKE Description: Patient/family will be able to demonstrate safe swallowing techniques and appropriate consistency for food and drink with cues/handouts Outcome: Progressing   

## 2018-10-28 NOTE — Discharge Summary (Signed)
Physician Discharge Summary  Patient ID: Rachel Fox MRN: [id number] DOB/AGE: 03/17/1946 73 y.o.  Admit date: 09/27/2018 Discharge date: 11/01/2018  Discharge Diagnoses:  Active Problems:   Right middle cerebral artery stroke (HCC)   Acute pain of left shoulder   AKI (acute kidney injury) (HCC)   Controlled type 2 diabetes mellitus with hyperglycemia, without long-term current use of insulin (HCC)   Dysphagia, post-stroke   Atrial fibrillation (HCC)   Spastic hemiparesis (HCC)   Labile blood glucose DVT prophylaxis Dysphagia Buttock wound  Discharged Condition: Stable  Significant Diagnostic Studies: Dg Knee 1-2 Views Left  Result Date: 10/11/2018 CLINICAL DATA:  Left knee pain EXAM: LEFT KNEE - 1-2 VIEW COMPARISON:  None. FINDINGS: No fracture or malalignment. Mild patellofemoral and medial joint space degenerative change. Faint joint space calcification. No significant knee effusion. IMPRESSION: 1. No acute osseous abnormality. 2. Mild degenerative changes.  Chondrocalcinosis. Electronically Signed   By: Jasmine PangKim  Fujinaga M.D.   On: 10/11/2018 19:29   Dg Abd 1 View  Result Date: 10/25/2018 CLINICAL DATA:  Constipation. EXAM: ABDOMEN - 1 VIEW COMPARISON:  None. FINDINGS: The bowel gas pattern is normal. Moderate amount of stool is seen in the right and left colon. No radio-opaque calculi or other significant radiographic abnormality are seen. IMPRESSION: No evidence of bowel obstruction or ileus. Moderate stool burden is noted. Electronically Signed   By: Lupita RaiderJames  Green Jr M.D.   On: 10/25/2018 09:54    Labs:  Basic Metabolic Panel: Recent Labs  Lab 10/28/18 0544  NA 142  K 4.1  CL 110  CO2 25  GLUCOSE 120*  BUN 16  CREATININE 1.10*  CALCIUM 9.9    CBC: Recent Labs  Lab 10/28/18 0544  WBC 8.3  HGB 14.6  HCT 45.7  MCV 85.3  PLT 200    CBG: Recent Labs  Lab 10/31/18 0635 10/31/18 1128 10/31/18 1655 10/31/18 2038 11/01/18 0640  GLUCAP 118* 128* 115* 126*  108*   Family history.  Mother with CVA.  Father with CAD and myocardial infarction.  Sister with CVA.  Sister with myocardial infarction.  Denies any diabetes mellitus or colon cancer  Brief HPI:   Rachel Fox is a 73 year old right-handed female with history of hyperlipidemia, hypertension, type 2 diabetes mellitus.  Lives alone independent prior to admission still driving.  2 level home 3 steps to entry.  Presented 09/20/2018 after being found down for an extended time.  Denied loss of consciousness.  Found to have left-sided weakness.  Blood pressure 212/171, UDS negative, WBC 14,000, sodium 146, BUN 31, creatinine 1.04, total CK of 2651.  EKG with atrial fibrillation with RVR and cardiology services consulted placed on intravenous Cardizem and dosed with digoxin and amiodarone that were also added.  Cranial CT scan showed moderately large subacute right MCA infarction.  No hemorrhage.  CT cervical spine negative.  Echocardiogram with ejection fraction of 65% hyperdynamic systolic function.  Carotid Dopplers with no ICA stenosis.  Wound care consulted for left buttock wound pressure injury wound care as directed.  Presently maintained on Eliquis for CVA prophylaxis as well as atrial fibrillation.  Cardizem and amiodarone as well as digoxin transition to p.o. with cardiac rate controlled.  CK improved 358.  Dysphagia #1 nectar thick liquid.  Therapy evaluations completed and patient was admitted for a comprehensive rehab program  Hospital Course: Rachel Fox was admitted to rehab 09/27/2018 for inpatient therapies to consist of PT, ST and OT at least three hours five  days a week. Past admission physiatrist, therapy team and rehab RN have worked together to provide customized collaborative inpatient rehab.  Pertaining to patient right MCA infarction remained stable maintained on Eliquis no bleeding episodes.  Patient would follow-up outpatient neurology.  DVT prophylaxis again Eliquis ongoing and  monitored.  Patient with ongoing issues of spasticity related to stroke maintained on baclofen plan possible Botox injection as outpatient.  Pain management with the use of Topamax for intermittent headaches Voltaren gel as needed and the addition of tramadol 50 mg every 12 hours as needed for severe pain.  Mood stabilization with emotional support provided neuropsych follow-up and she was receiving low-dose Xanax as needed.  Cardiac rate remained controlled and continued with Cardizem 90 mg 4 times daily, Lanoxin 0.25 mg daily and amiodarone 200 mg twice daily.  Patient would follow-up outpatient cardiology services.  Wound care patient with buttock wound follow-up wound care nurse showing wound care bed clean without any further slough.  There is still a bit of drainage with surgical team consulted no plan for debridement currently packing wound with aquacel daily using swab to field and cover with ABD pads and tape.  A home health nurse has been arranged for skin checks.  Blood sugars monitored hemoglobin A1c 6.1 with latest blood sugars 100-1 07 she had been on Glucophage prior to admission this could be resumed as needed and outpatient follow-up PCP.  Rhabdo myelosis improved latest creatinine 1.10.  She continue with Lipitor for hyperlipidemia.  Bouts of urinary frequency which was noted also at patient baseline placed on low-dose Ditropan PVRs were generally low.  UA multi-species.   Rehab course: During patient's stay in rehab weekly team conferences were held to monitor patient's progress, set goals and discuss barriers to discharge. At admission, patient required +2 physical assist sit to stand, moderate assist sit to supine, moderate assist for rolling.  Moderate assist upper body bathing moderate assist upper body dressing minimal assist functional mobility during ADLs.   Physical exam.  Blood pressure 115/94 pulse 87 temperature 98 respirations 18 oxygen saturations 100% room air Constitutional.   No distress HEENT Head.  Normocephalic and atraumatic Eyes.  Pupils round and reactive to light EOMs normal no discharge Neck.  Supple nontender no tracheal deviation no thyromegaly Cardiovascular normal rate and rhythm exam reveals no friction rub or murmur heard Respiratory effort normal no respiratory distress no wheezes no rails GI.  Soft exhibits no distention no rebound Musculoskeletal no deformity or edema left shoulder tender with passive internal rotation external rotation and abduction Neurological alert and oriented to person place recalls biographical information.  Left central 7 and tongue deviation.  Left HH and inattention.  Left upper extremity left lower extremity 0 out of 5 decreased light touch to left arm and leg right upper and right lower extremity 4 out of 5.  Left buttock wound was dressed.  She  has had improvement in activity tolerance, balance, postural control as well as ability to compensate for deficits. She has had improvement in functional use RUE/LUE  and RLE/LLE as well as improvement in awareness.  Patient performs supine to sit left side of bed with moderate assist and heavy use of bed features.  Perform squat pivot to wheelchair to right and moderate assist.  Propelled wheelchair in day room with minimal assistance on a straight path.  Working with energy conservation techniques.  Performs squat pivot to bed to left moderate assist and perform sit to supine with moderate assist with  patient able to place left lower extremity onto bed without assist.  Patient standing with moderate assist for balance while therapist performed total assist for hygiene and clothing management.  Family teaching ongoing discharge planning home versus skilled nursing facility       Disposition: Discharge disposition: 03-Skilled [place]     Discharge to home   Diet: Dysphagia #2 thin liquids  Special Instructions: No driving smoking or alcohol  Home health nurse  pack buttock wound with Aquacel daily using swab to fill then cover with ABD pad and tape  Medications at discharge. 1.  Tylenol as needed 2.  Xanax 0.25 mg 3 times daily as needed 3.  Amiodarone 200 mg p.o. twice daily 4.  Eliquis 5 mg p.o. twice daily 5.  Lipitor 80 mg p.o. daily 6.  Baclofen 10 mg p.o. 3 times daily 7.  Voltaren gel 2 g 4 times daily to affected area 8.  Lanoxin 0.125 mg p.o. daily 9.  Cardizem 90 mg p.o. 4 times daily 10.  myrbetriq 25 mg daily 11.  Topamax 25 mg p.o. twice daily 12.  Tramadol 50 mg every 12 hours as needed severe pain  Discharge Instructions    Ambulatory referral to Neurology   Complete by: As directed    An appointment is requested in approximately 4 to 6 weeks right MCA infarction   Ambulatory referral to Physical Medicine Rehab   Complete by: As directed    Follow up one month right MCA infarct.Patient for SNF placement       Contact information for follow-up providers    Kirsteins, Luanna Salk, MD Follow up.   Specialty: Physical Medicine and Rehabilitation Why: Office to call for appointment Contact information: [place] [id number] [phone]        Arnoldo Lenis, MD Follow up.   Specialty: Cardiology Why: Call for appointment Contact information: [address] [place] [address] [phone]            Contact information for after-discharge care    Destination    [facility] .   Service: Skilled Nursing Contact information: [address] [place] [phone]                  Signed: Lavon Paganini [place] 11/01/2018, 8:14 AM

## 2018-10-28 NOTE — Progress Notes (Signed)
Social Work Patient ID: Rachel Fox, female   DOB: [birth year], 73 y.o.   MRN: [id number]   CSW met with pt and spoke with her niece via telephone to update them on team conference discussion and to confirm family education 10-28-18.  Niece is concerned about having a caregiver for pt 24/7 and someone that can provide the proper level of care.  Niece can stay for a little while, but they are not sure this is the best plan and are considering SNF care prior to pt coming home.  Family to decide after seeing pt in education.  CSW to meet with them to help them with whatever they decide is best for pt.  CSW will continue to follow and assist with d/c disposition.

## 2018-10-28 NOTE — Progress Notes (Signed)
Speech Language Pathology Daily Session Note  Patient Details  Name: Rachel Fox MRN: [id number] Date of Birth: May 14, 1945  Today's Date: 10/28/2018 SLP Individual Time: 1200-1300 SLP Individual Time Calculation (min): 60 min  Short Term Goals: Week 4: SLP Short Term Goal 1 (Week 4): Patient will demonstate efficient mastication and complete oral clearance with trials of dys. 3 textures with Min A verbal cues for utilization of compensatory strategies. SLP Short Term Goal 2 (Week 4): Patient will utilize speech intelligibility strategies at the sentence level with Min A verbal cues to achieve ~70% intelligibility. SLP Short Term Goal 3 (Week 4): Patient will demonstrate problem solving in semi-complex tasks with Supervision verbal cues. SLP Short Term Goal 4 (Week 4): Patient will self-monitor and correct errors during semi-complex problem solving tasks with Supervision verbal and visual cues. SLP Short Term Goal 5 (Week 4): Patient will utilize memory compensatory strategies to recall new, daily information with supervision A verbal cues.  Skilled Therapeutic Interventions:  Skilled treatment session focused on completing education with pt's son and niece. Education provided on cognitive strengths, weaknesses and description of how much support pt with require for ADLs as a result of cognitive deficits. Explanation also provided on dysphagia 2 and s/s of oropharyngeal dysphagia. Pt's lunch tray arrived and they were able to observe the textures presented. All questions answered to their satisfaction.      Pain Pain Assessment Pain Scale: 0-10 Pain Score: 0-No pain  Therapy/Group: Individual Therapy  Pennie Vanblarcom 10/28/2018, 1:15 PM

## 2018-10-28 NOTE — Progress Notes (Signed)
Physical Therapy Session Note  Patient Details  Name: Rachel Fox MRN: [id number] Date of Birth: 08/12/1945  Today's Date: 10/28/2018 PT Individual Time: 1005-1105 PT Individual Time Calculation (min): 60 min   Short Term Goals: Week 4:  PT Short Term Goal 1 (Week 4): STG=LTG due to ELOS  Skilled Therapeutic Interventions/Progress Updates:   Pt received sitting in WC and agreeable to PT. Son and niece present for family education. PT instructed pt in WC mobility with min assist and max cues for attention to task, attention to L side, sequencing and technique to maintain straight path and prevent hitting obstacles on the L. 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 Blocked practice squat pivot and SB transfer with PT and son. Max multimodal cues for safety, posture, midline orientation, and use of partial squat pivot across SB to prevent sheer forces on L sided gluteal wound, mod-max assist due to pushers syndrome and and improve safety. Pt's family also instructed in car transfer with SB performed by PT with max assist and max cues for LE placement to improve safety and sequencing. Patient returned to room and left sitting in [facility] with call bell in reach and all needs met.         Therapy Documentation Precautions:  Precautions Precautions: Fall Precaution Comments: L side weak/flaccid, right gaze preference, L shoulder pain (risk for subluxation), pressure injury from laying on floor PTA Restrictions Weight Bearing Restrictions: No Pain: denies   Therapy/Group: Individual Therapy  [name] 10/28/2018, 12:03 PM

## 2018-10-28 NOTE — Patient Care Conference (Signed)
Inpatient RehabilitationTeam Conference and Plan of Care Update Date: 10/27/2018   Time: 10:40 AM    Patient Name: Rachel Fox      Medical Record Number: [id number]  Date of Birth: 1945/06/06 Sex: Female         Room/Bed: 4W12C/4W12C-01 Payor Info: Payor: MEDICARE / Plan: MEDICARE PART A AND B / Product Type: *No Product type* /    Admitting Diagnosis: 5. CVA 1 Team  RT. CVA; 22-24days  Admit Date/Time:  09/27/2018  4:04 PM Admission Comments: No comment available   Primary Diagnosis:  <principal problem not specified> Principal Problem: <principal problem not specified>  Patient Active Problem List   Diagnosis Date Noted  . Spastic hemiparesis ([place])   . Labile blood glucose   . Acute pain of left shoulder   . AKI (acute kidney injury) ([place])   . Controlled type 2 diabetes mellitus with hyperglycemia, without long-term current use of insulin ([place])   . Dysphagia, post-stroke   . Atrial fibrillation ([place])   . Right middle cerebral artery stroke ([place]) 09/27/2018  . Pressure injury of skin 09/21/2018  . Cerebrovascular accident (CVA) with involvement of left side of body ([place]) 09/20/2018  . Atrial fibrillation with RVR ([place]) 09/20/2018  . Hypertension   . Rhabdomyolysis   . Type 2 diabetes mellitus ([place])   . Hyperlipidemia   . Hyperbilirubinemia   . Leukocytosis   . Hypernatremia     Expected Discharge Date: Expected Discharge Date: 10/29/18(vs. SNF)  Team Members Present: Physician leading conference: Dr. Alysia Penna Social Worker Present: Alfonse Alpers, LCSW Nurse Present: Benjie Karvonen, RN PT Present: Roderic Ovens, PT OT Present: Darleen Crocker, OT SLP Present: Charolett Bumpers, SLP PPS Coordinator present : Gunnar Fusi, SLP     Current Status/Progress Goal Weekly Team Focus  Medical   Left neglect, left buttocks wound improving, still has severe left hemiparesis with increasing spasticity  Reduce fall risk increase truncal stability reduce pressure on  left buttocks  Discharge planning   Bowel/Bladder   Con. B/B  To remain con. of B/b with timed toileting.      Swallow/Nutrition/ Hydration   dys  2 and thin,  min-supervisionA, check for oral residue  Supervision A  swallow strategies and edcuation   ADL's   Mod A bathing w/c level at sink, Mod A UB dressing, Max A LB dressing, Total A for toileting using Stedy lift, Stedy for bathroom transfers, Mod-Max A squat pivot transfers. Increased spasticity and pain in L UE  Downgraded to Mod A overall  NMR, Lt attention, ADL retraining, functional transfers, cognition, family education   Mobility   transfers with stedy with min A, max A sit <> stand  Min-mod assist  transfers, family ed   Communication   Mod-MinA  Min A at sentence level  awareness for vocal intensity and speech intelligiblity stratetgies, education   Safety/Cognition/ Behavioral Observations  Mod-Min A  Min A  semi-complex problemsolving, recall and emergent awareness,edcuation   Pain   C/o pain with headache Relieved by tylenlol.  To have pain less then a 2 and give Tylenlol as needed.      Skin   Stage 1 to the heel. unstageable pressure wound to the left bottocks.  To remain free from further breakdown and change wound as needed and daily.       Rehab Goals Patient on target to meet rehab goals: Yes Rehab Goals Revised: none *See Care Plan and progress notes for long and short-term goals.  Barriers to Discharge  Current Status/Progress Possible Resolutions Date Resolved   Physician    Medical stability;Wound Care;Incontinence     Slow progress towards goals  Caregiver training, if family does not feel like they can care for her then proceed with SNF placement      Nursing                  PT                    OT                  SLP                SW                Discharge Planning/Teaching Needs:  Pt's family is coming for family education to see if they can provide assistance needed at home vs.  pt going to SNF prior to return home.  Family education 10-28-18.   Team Discussion:  Pt's wound is improving over weeks - pinker, smaller, healthier.  Pt on topamax for headaches.  Pt has urinary incontinence and frequency and Dr. Wynn BankerKirsteins is working on medication adjustments.  Pt has not made much change in the last week, especially due to increased tone in left arm and swelling in hand.  She needs mod A bathing at the sink in w/c.  Pt has a left lean and can't control it, so showering at home will not be safe.  Pt is max A LB dressing and mod to total A for toileting.  Pt needs max A for transfers.  Pt is max A for sit to stands and mod A bed mobility and max A for gait.  ST stated that pt still has oral residue with eating.  Her communication is min to mod A.  Her awareness and memory are a little better at min/mod A.  Revisions to Treatment Plan:  none    Continued Need for Acute Rehabilitation Level of Care: The patient requires daily medical management by a physician with specialized training in physical medicine and rehabilitation for the following conditions: Daily direction of a multidisciplinary physical rehabilitation program to ensure safe treatment while eliciting the highest outcome that is of practical value to the patient.: Yes Daily medical management of patient stability for increased activity during participation in an intensive rehabilitation regime.: Yes Daily analysis of laboratory values and/or radiology reports with any subsequent need for medication adjustment of medical intervention for : Neurological problems;Wound care problems   I attest that I was present, lead the team conference, and concur with the assessment and plan of the team.Team conference was held via web/ teleconference due to COVID - 19.    Fedor Kazmierski, Vista DeckJennifer Capps 10/28/2018, 12:40 AM

## 2018-10-28 NOTE — Progress Notes (Signed)
  Patient ID: Rachel Fox, female   DOB: Jul 29, 1945, 73 y.o.   MRN: [id number]    Diagnosis code:  I63.411; I69.354  Height:         5'6"  Weight:        160 lbs   Patient has hemiplegia, hemiparesis, and an unstageable wound on her buttocks which requires her torso, arm, and bottom to be positioned in ways not feasible with a normal bed.  Head must be elevated at least 30 degrees or pressure on pt's bottom causes pain.  Pt requires frequent and immediate changes in body position which cannot be achieved with a normal bed.

## 2018-10-28 NOTE — Progress Notes (Addendum)
[facility] PHYSICAL MEDICINE & REHABILITATION PROGRESS NOTE   Subjective/Complaints:  Patient without new complaints.  Family has decided on SNF placement she still continues have left shoulder pain.  ROS: Patient denies+ dry mouth, no abd pain, no N/V/D   Objective:   No results found. Recent Labs    10/28/18 0544  WBC 8.3  HGB 14.6  HCT 45.7  PLT 200   Recent Labs    10/28/18 0544  NA 142  K 4.1  CL 110  CO2 25  GLUCOSE 120*  BUN 16  CREATININE 1.10*  CALCIUM 9.9    Intake/Output Summary (Last 24 hours) at 10/28/2018 0814 Last data filed at 10/27/2018 1849 Gross per 24 hour  Intake 300 ml  Output -  Net 300 ml     Physical Exam: Vital Signs Blood pressure (!) 155/75, pulse 65, temperature 98.4 F (36.9 C), temperature source Oral, resp. rate 16, height 5\' 3"  (1.6 m), weight 72.1 kg, SpO2 98 %. Constitutional: No distress . Vital signs reviewed. HEENT: EOMI, oral membranes moist Neck: supple Cardiovascular: RRR without murmur. No JVD    Respiratory: CTA Bilaterally without wheezes or rales. Normal effort    GI: BS +, non-tender,mildly distended Musc: No edema or tenderness in extremities Neurologic: Alert Motor: Grossly 5/5 RUE/RLE LUE: Grossly 1/5 proximal distal-- LLE: Grossly 2-/5 proximal distal-- Tone 2/4 flexor LUE and extensor LLE Psych: no lability this am   Decub 8/11 top and 7/29 bottom m      Assessment/Plan: 1. Functional deficits secondary to right MCA infarct which require 3+ hours per day of interdisciplinary therapy in a comprehensive inpatient rehab setting.  Physiatrist is providing close team supervision and 24 hour management of active medical problems listed below.  Physiatrist and rehab team continue to assess barriers to discharge/monitor patient progress toward functional and medical goals  Care Tool:  Bathing    Body parts bathed by patient: Left arm, Chest, Abdomen(ub only)   Body parts bathed by helper: Right arm      Bathing assist Assist Level: Minimal Assistance - Patient > 75%     Upper Body Dressing/Undressing Upper body dressing   What is the patient wearing?: Pull over shirt    Upper body assist Assist Level: Moderate Assistance - Patient 50 - 74%    Lower Body Dressing/Undressing Lower body dressing      What is the patient wearing?: Pants, Incontinence brief     Lower body assist Assist for lower body dressing: Maximal Assistance - Patient 25 - 49%     Toileting Toileting    Toileting assist Assist for toileting: Total Assistance - Patient < 25%     Transfers Chair/bed transfer  Transfers assist  Chair/bed transfer activity did not occur: Safety/medical concerns  Chair/bed transfer assist level: Maximal Assistance - Patient 25 - 49%     Locomotion Ambulation   Ambulation assist   Ambulation activity did not occur: Safety/medical concerns  Assist level: Moderate Assistance - Patient 50 - 74% Assistive device: Walker-rolling Max distance: 15   Walk 10 feet activity   Assist  Walk 10 feet activity did not occur: Safety/medical concerns  Assist level: Moderate Assistance - Patient - 50 - 74% Assistive device: Walker-rolling   Walk 50 feet activity   Assist Walk 50 feet with 2 turns activity did not occur: Safety/medical concerns         Walk 150 feet activity   Assist Walk 150 feet activity did not occur: Safety/medical concerns  Walk 10 feet on uneven surface  activity   Assist Walk 10 feet on uneven surfaces activity did not occur: Safety/medical concerns         Wheelchair     Assist Will patient use wheelchair at discharge?: Yes Type of Wheelchair: Manual    Wheelchair assist level: Minimal Assistance - Patient > 75% Max wheelchair distance: 150    Wheelchair 50 feet with 2 turns activity    Assist        Assist Level: Supervision/Verbal cueing   Wheelchair 150 feet activity     Assist Wheelchair  150 feet activity did not occur: Safety/medical concerns   Assist Level: Minimal Assistance - Patient > 75%    Medical Problem List and Plan: 1.Lefthemiparesis, dysphagia, and visual-spatial deficitssecondary to right MCA infarction 7/6, now with spasticity  Continue CIR SNF placement will need FL 2  2. Antithrombotics: -DVT/anticoagulation:Continue Eliquis -antiplatelet therapy: N/A 3. Pain Management:Tylenol as needed-  topamax for HA with some benefit increased to BID-improving -maintain proper shoulder position and support when in bed and chair.  May need Botox to pec or possibly corticosteroid can be done as OP  -Voltaren gel  -added tramadol 50mg  q12 prn for severe pain 4. Mood:Provide emotional support -antipsychotic agents: N/A  -neuro-psych input would be helpful  -likely some disinhibition related to location of CVA  -?reactive anxiety/depression also an issue--add prn xanax low dose 5. Neuropsych: This patientiscapable of making decisions on herown behalf. 6. Skin/Wound Care:Routine skin checks. Aquacel, gen surg evaled there is no evidence of infection but some necrotic tissue noted.  May benefit from hydro therapy  7. Fluids/Electrolytes/Nutrition:Routine in and outs 8.Atrial fibrillation. Amiodarone 200 mg twice daily, Lanoxin 0.125 mg daily, Cardizem 90 mg 4 times daily.   Vitals:   10/28/18 0345 10/28/18 0751  BP: 125/67 (!) 155/75  Pulse: 63 65  Resp: 16   Temp: 98.4 F (36.9 C)   SpO2: 99% 98%   HR controlled, 8/12  9.  Post strokedysphagia. Dysphagia #2, thin liquids  Advance diet per SLP 10. Diabetes mellitus. Hemoglobin A1c 6.1. SSI. Patient on Glucophage 500 mg twice daily prior to admission. Resume as indicated CBG (last 3)  Recent Labs    10/27/18 1656 10/27/18 2135 10/28/18 0603  GLUCAP 131* 129* 129*  Controlled 8/13 11.Rhabdomyolysis. no sign of renal failure  12.  Hyperlipidemia. Lipitor 13. Constipation. KUB mod stool burden cont laxatives 14.  Leukocytosis: Resolved  not febrile, ua equivocal, ucx multispecies  WBCs 6.1      15.  Hypoalbuminemia  Supplement initiated 16.  AKI  Resolved     17. Urinary frequency: had some at baseline  -PVR's generally low  No sig benefit from Oxybutnin, trial myrbetriq   LOS: 31 days A FACE TO [place] E Omeed Osuna 10/28/2018, 8:14 AM

## 2018-10-28 NOTE — Progress Notes (Signed)
Occupational Therapy Session Note  Patient Details  Name: Rachel Fox MRN: [id number] Date of Birth: [birth year]  Today's Date: 10/28/2018 OT Individual Time: 1110-1205 OT Individual Time Calculation (min): 55 min    Short Term Goals: Week 4:  OT Short Term Goal 1 (Week 4): STGs=LTGs secondary to upcoming discharge  Skilled Therapeutic Interventions/Progress Updates:    Treatment session with focus on hands on family education with pt and her son and niece.  Pt declined bathing and dressing this session, but agreeable to simulating aspects of self-care tasks.  Therapist educated on pt and family on Lt lateral lean, decreased midline orientation, and increased pushing during dynamic standing tasks.  Encouraged seated tasks as much as possible and static standing at sink for hygiene.  Pt's son engaged in sit> stand with pt with good awareness of body mechanics and hand placement to facilitate sit> stand and maintain standing while he completed "hygiene".  Pt's niece engaged in sit > stand with pt, requiring mod cues for body positioning and how to assist with facilitating standing.  Pt's niece would benefit from additional hands on training as pt fatiguing quickly during this session (after 1hr with PT).  Educated on slide board vs squat pivot transfers.  Therapist demonstrated transfer w/c > drop arm BSC with slide board and mod-max assist and transfer BSC > w/c with mod-max assist without slide board.  Pt with increased fatigue and unable to engage in additional transfer training at this time.  Discussed with family recommendation for further hands on education prior to d/c if pt to d/c home with assist from family.  Therapy Documentation Precautions:  Precautions Precautions: Fall Precaution Comments: L side weak/flaccid, right gaze preference, L shoulder pain (risk for subluxation), pressure injury from laying on floor PTA Restrictions Weight Bearing Restrictions: No Pain:  Pt with c/o  pain in LUE, repositioned.   Therapy/Group: Individual Therapy  Simonne Come 10/28/2018, 12:15 PM

## 2018-10-28 NOTE — Progress Notes (Signed)
  Patient ID: Rachel Fox, female   DOB: [birth year], 73 y.o.   MRN: [id number]      Diagnosis codes:  I63.411; I69.354  Height:          5'6"      Weight:          160 lbs      Patient suffers from hemiplegia and hemiparesis following cerebral infarction affecting left non-dominant side which impairs her ability to perform daily activities like bathing, dressing, grooming, feeding, toileting in the home.  A walker will not resolve issue with performing activities of daily living.  A wheelchair will allow patient to safely perform daily activities.  Patient is not able to propel herself in the home using a standard weight wheelchair due to arm weakness and endurance.  Patient can self propel in the lightweight wheelchair.

## 2018-10-29 ENCOUNTER — Inpatient Hospital Stay (HOSPITAL_COMMUNITY): Payer: Medicare Other | Admitting: Speech Pathology

## 2018-10-29 ENCOUNTER — Encounter (HOSPITAL_COMMUNITY): Payer: Medicare Other | Admitting: Occupational Therapy

## 2018-10-29 ENCOUNTER — Inpatient Hospital Stay (HOSPITAL_COMMUNITY): Payer: Medicare Other | Admitting: Occupational Therapy

## 2018-10-29 ENCOUNTER — Inpatient Hospital Stay (HOSPITAL_COMMUNITY): Payer: Medicare Other | Admitting: Physical Therapy

## 2018-10-29 LAB — GLUCOSE, CAPILLARY
Glucose-Capillary: 112 mg/dL — ABNORMAL HIGH (ref 70–99)
Glucose-Capillary: 115 mg/dL — ABNORMAL HIGH (ref 70–99)
Glucose-Capillary: 128 mg/dL — ABNORMAL HIGH (ref 70–99)

## 2018-10-29 MED ORDER — ALPRAZOLAM 0.25 MG PO TABS: 0.2500 mg | ORAL_TABLET | Freq: Three times a day (TID) | ORAL | 0 refills | Status: DC | PRN

## 2018-10-29 MED ORDER — BISACODYL 10 MG RE SUPP
10.0000 mg | Freq: Every day | RECTAL | Status: DC | PRN
  Administered 2018-11-01: 07:00:00 10 mg via RECTAL
  Filled 2018-10-29: qty 1

## 2018-10-29 MED ORDER — TOPIRAMATE 25 MG PO TABS: 25.0000 mg | ORAL_TABLET | Freq: Two times a day (BID) | ORAL | 0 refills | Status: DC

## 2018-10-29 MED ORDER — TRAMADOL HCL 50 MG PO TABS: 50.0000 mg | ORAL_TABLET | Freq: Two times a day (BID) | ORAL | 0 refills | Status: DC | PRN

## 2018-10-29 MED ORDER — DICLOFENAC SODIUM 1 % TD GEL: 2.0000 g | Freq: Four times a day (QID) | TRANSDERMAL | Status: DC

## 2018-10-29 MED ORDER — BACLOFEN 10 MG PO TABS: 10.0000 mg | ORAL_TABLET | Freq: Three times a day (TID) | ORAL | 0 refills | Status: DC

## 2018-10-29 MED ORDER — ACETAMINOPHEN 325 MG PO TABS: 650.0000 mg | ORAL_TABLET | ORAL | Status: DC | PRN

## 2018-10-29 MED ORDER — OXYBUTYNIN CHLORIDE ER 15 MG PO TB24: 15.0000 mg | ORAL_TABLET | Freq: Every day | ORAL | Status: DC

## 2018-10-29 NOTE — Plan of Care (Signed)
  Problem: RH BLADDER ELIMINATION Goal: RH STG MANAGE BLADDER WITH ASSISTANCE Description: STG Manage Bladder With Assistance - Mod Outcome: Progressing   Problem: RH SKIN INTEGRITY Goal: RH STG ABLE TO PERFORM INCISION/WOUND CARE W/ASSISTANCE Description: STG Able To Perform Incision/Wound Care With Assistance. Total assist from caregiver  Outcome: Not Progressing   Problem: RH SAFETY Goal: RH STG ADHERE TO SAFETY PRECAUTIONS W/ASSISTANCE/DEVICE Description: STG Adhere to Safety Precautions With Assistance/Device. Mod Outcome: Progressing   Problem: RH PAIN MANAGEMENT Goal: RH STG PAIN MANAGED AT OR BELOW PT'S PAIN GOAL Description: Less than 4 Outcome: Progressing   Problem: RH KNOWLEDGE DEFICIT Goal: RH STG INCREASE KNOWLEDGE OF DIABETES Description: Patient/family will be able to verbalize target blood sugar values, medication and diet to control T2DM with cues/handouts Outcome: Progressing Goal: RH STG INCREASE KNOWLEDGE OF HYPERTENSION Description: Patient/Family will be able to verbalize target BP, interpreting blood pressure readings, medication and diet to control HTN with cues/handouts Outcome: Progressing Goal: RH STG INCREASE KNOWLEDGE OF DYSPHAGIA/FLUID INTAKE Description: Patient/family will be able to demonstrate safe swallowing techniques and appropriate consistency for food and drink with cues/handouts Outcome: Progressing

## 2018-10-29 NOTE — Progress Notes (Addendum)
Speech Language Pathology Discharge Summary  Patient Details  Name: Rachel Fox MRN: [id number] Date of Birth: 04-14-1945  Today's Date: 10/29/2018 SLP Individual Time: 8921-1941; 7408-1448 SLP Individual Time Calculation (min): 13 min, 33 min   Skilled Therapeutic Interventions:  Skilled treatment session focused on cognition and dysphagia goals. SLP recieved pt with call light on. SLP facilitated session by providing Mod A cues to increase vocal intensity to communicate wants and needs. Pt communicated that she needed to use bathroom. Nursing already entering room for call light. SLP to come back for observation of lunch tray being held for lunch with SLP.    SLP facilitiated session by providing skilled observation of pt consuming dysphagia 2 lunch tray with thin liquids. Pt continues to lack awareness of anterior spillage and left buccal residue. Additionally, pt's vocal intensity continues to be reduced during conversation. During this session, pt appeared more irritated by various things such as reminders for increasing vocal intensity. At end of session pt left in wheelchair, lap belt alarm on and all needs within reach.     Patient has met 7 of 7 long term goals.  Patient to discharge at overall Min;Supervision level.  Reasons goals not met:   N/A  Clinical Impression/Discharge Summary:   Pt has made progress during skilled ST and would continue to benefit from further skilled ST in the areas of semi-complex cognition/problem solving, selective attention, attention to left of midline, oral phase dysphagia and overall awareness of deficits.   Care Partner:  Caregiver Able to Provide Assistance: No  Type of Caregiver Assistance: Physical;Cognitive  Recommendation:  Skilled Nursing facility  Rationale for SLP Follow Up: Maximize swallowing safety;Reduce caregiver burden;Maximize cognitive function and independence;Maximize functional communication   Equipment:   N/A  Reasons  for discharge: Treatment goals met   Patient/Family Agrees with Progress Made and Goals Achieved: Yes    Jaamal Farooqui 10/29/2018, 3:20 PM

## 2018-10-29 NOTE — Progress Notes (Signed)
Occupational Therapy Weekly Progress Note  Patient Details  Name: Rachel Fox MRN: [id number] Date of Birth: 26-May-1945  Beginning of progress report period: 10/21/2018 End of progress report period: 10/29/2018  Today's Date: 10/29/2018 OT Individual Time: [id number] and [id number] OT Individual Time Calculation (min): 70 min and 32 min   No STGs were set due to ELOS. Family has opted for d/c SNF vs home, so LOS was extended for bed search.   Pt has made minimal progress at time of report. She continues to require Mod-Max A for squat pivot transfers and self care tasks. Progress has been limited by dense Lt hemiplegia, Lt shoulder pain, cognitive and trunk control deficits. Family education has been completed with family choosing SNF for next venue of care. Continue POC with LTGs set at Mod A overall.   Patient continues to demonstrate the following deficits: muscle weakness, muscle joint tightness and muscle paralysis, decreased cardiorespiratoy endurance, abnormal tone, unbalanced muscle activation, decreased coordination and decreased motor planning, decreased midline orientation, decreased attention to left and left side neglect, decreased initiation, decreased attention, decreased awareness, decreased problem solving, decreased safety awareness and decreased memory and decreased sitting balance, decreased standing balance, decreased postural control and hemiplegia and therefore will continue to benefit from skilled OT intervention to enhance overall performance with BADL.  Patient progressing toward long term goals..  Continue plan of care.  OT Short Term Goals Week 4:  OT Short Term Goal 1 (Week 4): STGs=LTGs secondary to upcoming discharge OT Short Term Goal 1 - Progress (Week 4): Progressing toward goal Week 5:  OT Short Term Goal 1 (Week 5): STGs=LTGs due to ELOS  Skilled Therapeutic Interventions/Progress Updates:    Pt greeted in bed, requesting to use the bathroom. Mod A for  supine<sit and Max A for squat pivot<drop arm BSC. While she attempted to void bowels, pt completed bathing/dressing sit<stand. Due to Lt shoulder pain, she was able to tolerate very little HOH with L UE (I.e. stabilizing deodorant or applying lotion to Rt forearm). Per pt, at rest she had no L UE pain. Max A for sit<stand and for dynamic standing balance during LB self care, including perihygiene. Max manual facilitation and vcs for minimizing Lt pushing. She had trouble finding midline without mirror for visual feedback. Utilized seated figure 4 position for R LE, however pt unable to bend forward safely in order to thread LB garments over leg unassisted. Modified figure 4 was too painful to obtain for L LE. Max-Total A for LB, including Teds. Mod A for donning overhead shirt using hemi techniques. Stedy transfer then completed to w/c with Mod A for sit<stand. Oral care completed w/c level at sink with manual facilitation for neutral midline in order to offset Lt push/lean. After handwashing with HOH assist, she was positioned at bedside. Left in care of PT for next therapy session. Tx focus placed on ADL retraining, sit<stands, sitting/standing balance, and NMR.    Pt unable to void bowels during session. Family also not present for family education.   2nd Session 1:1 tx (32 min) Pt greeted in w/c, requesting to use the restroom. Stedy transfer to toilet completed with Max A sit<stand. Pt better able to maintain midline orientation while semi perched in [place]. Still unable to tolerate setup of Lt hand gripping bar due to shoulder pain. She had continent bladder void while sitting on toilet. Mod-Max A sit<stand. While semi perched in [place], pt completed perihygiene with Min A for balance. Pt returned to bed  with Max A for sit<supine. Hand hygiene completed with [facility] for inclusion of Lt. She was repositioned in sidelying for pressure relief and comfort. Pt left with all needs within reach and 4 bedrails up.   Therapy Documentation Precautions:  Precautions Precautions: Fall Precaution Comments: L side weak/flaccid, right gaze preference, L shoulder pain (risk for subluxation), pressure injury from laying on floor PTA Restrictions Weight Bearing Restrictions: No Vital Signs: Therapy Vitals Temp: 97.6 F (36.4 C) Temp Source: Oral Pulse Rate: 80 Resp: 17 BP: 135/78 Patient Position (if appropriate): Lying Oxygen Therapy SpO2: 100 % O2 Device: Room Air ADL:      Therapy/Group: Individual Therapy  Trinitey Roache A Sheyann Sulton 10/29/2018, 7:25 AM

## 2018-10-29 NOTE — Progress Notes (Addendum)
Social Work Patient ID: Gaye Alken, female   DOB: [birth year], 73 y.o.   MRN: [id number]  According to Jenny-LCSW pt is NHP and will begin search and get COVID test. Dan-PA aware and will order. Bed offers family will need to decide which facility they want to go to.  [facility] bed offer pending COVID results, plan for Monday transfer. Will let team and MD know.

## 2018-10-29 NOTE — Progress Notes (Signed)
Physical Therapy Session Note  Patient Details  Name: Rachel Fox MRN: [id number] Date of Birth: 1946-03-13  Today's Date: 10/29/2018 PT Individual Time: 1050-1130 PT Individual Time Calculation (min): 40 min   Short Term Goals: Week 4:  PT Short Term Goal 1 (Week 4): STG=LTG due to ELOS  Skilled Therapeutic Interventions/Progress Updates:   Pt received sitting in WC and agreeable to PT. Pt transported to orthogym in [place]. Dynamic standing balance and midline orientation re education in standing frame.  Max assist and cues for relaxation of R side quad, glutes, trunk and shoulder to prevent pushing to the L. Lateral reaches to the R with max cues for use of LLE to initiate R weight shift. Mini squats with max cues and mod assist in standing frame to allow improved weight shift to the R. Sit<>stand with mo assist and max cues for set up and sequencing from PT to prevent pushing L. Gait training with RW x 38f with max assist and max cues for sequencing posture, step length, R weight shift, and attention the LLE. [facility] and all needs met.         Therapy Documentation Precautions:  Precautions Precautions: Fall Precaution Comments: L side weak/flaccid, right gaze preference, L shoulder pain (risk for subluxation), pressure injury from laying on floor PTA Restrictions Weight Bearing Restrictions: No Pain: denies  Therapy/Group: Individual Therapy  ALorie Phenix8/14/2020, 12:11 PM

## 2018-10-29 NOTE — Progress Notes (Signed)
Social Work Patient ID: Rachel Fox, female   DOB: 09/29/1945, 73 y.o.   MRN: [id number]   CSW met with family yesterday following family education.  Niece, DeeDee Straws and son, Alycen Mack were present.  They are very torn about the best plan for pt, as they know she wants to go home, but realize she could benefit from more therapy.  Upon further discussion, niece and son decided SNF was the better choice.  CSW initiated SNF search and received offers.  Shared offers with family and they have chosen [facility] SNF and they will have a bed available on Monday, 11-01-18.  Hopefully pt's COVID test results will be back by then. Pt will need non-emergent ambulance transport.  Family is very pleased with the plan and son will be discussing it further with pt when he visits to let her know of the facility and plan.  CSW will continue to follow and assist with pt's transition to SNF, planning for Monday.

## 2018-10-30 LAB — GLUCOSE, CAPILLARY
Glucose-Capillary: 119 mg/dL — ABNORMAL HIGH (ref 70–99)
Glucose-Capillary: 120 mg/dL — ABNORMAL HIGH (ref 70–99)
Glucose-Capillary: 99 mg/dL (ref 70–99)
Glucose-Capillary: 116 mg/dL — ABNORMAL HIGH (ref 70–99)

## 2018-10-30 LAB — NOVEL CORONAVIRUS, NAA (HOSP ORDER, SEND-OUT TO REF LAB; TAT 18-24 HRS): SARS-CoV-2, NAA: NOT DETECTED

## 2018-10-30 NOTE — Progress Notes (Signed)
Physical Therapy Discharge Summary  Patient Details  Name: Rachel Fox MRN: [id number] Date of Birth: 03-25-1945  Today's Date: 10/30/2018     Patient has met 3 of 6 long term goals due to improved activity tolerance, improved balance, improved postural control, increased strength, decreased pain, improved attention and improved awareness.  Patient to discharge at a wheelchair level Mod to [place].   Patient's care partner unavailable to provide the necessary physical assistance at discharge.  Patient will continue skilled PT in a skilled nursing facility prior to d/c home.  Reasons goals not met: Patient with continued L hemiplegia and pushing to L during attempts at standing balance, and with sit to stand and car transfers not attempted due to not safe at this time.    Recommendation:  Patient will benefit from ongoing skilled PT services in skilled nursing facility setting to continue to advance safe functional mobility, address ongoing impairments in balance, safety, bed mobility, transfers, Gait, WC mobility, awareness, and minimize fall risk.  Equipment: No equipment provided  Reasons for discharge: discharge from hospital  Patient/family agrees with progress made and goals achieved: Yes  PT Discharge Precautions/Restrictions Precautions Precautions: Fall Precaution Comments: Dense Lt hemi, Rt gaze, pressure injury  Vision/Perception  Vision - Assessment Alignment/Gaze Preference: Gaze right Perception Perception: Impaired Inattention/Neglect: Does not attend to left visual field;Does not attend to left side of body Praxis Praxis: Impaired Praxis Impairment Details: Initiation;Motor planning;Perseveration  Cognition Overall Cognitive Status: Impaired/Different from baseline Arousal/Alertness: Awake/alert Orientation Level: Oriented X4 Attention: Sustained Sustained Attention: Impaired Memory: Impaired Awareness: Impaired Awareness Impairment: Emergent  impairment Problem Solving: Impaired Safety/Judgment: Impaired Sensation Sensation Light Touch: Impaired Detail Central sensation comments: decreased apppreciation to light tough in the LLE Light Touch Impaired Details: Impaired LLE;Impaired LUE Proprioception: Impaired by gross assessment Coordination Gross Motor Movements are Fluid and Coordinated: No Fine Motor Movements are Fluid and Coordinated: No Coordination and Movement Description: L hemiparesis Heel Shin Test: unable to perform on the L Motor  Motor Motor: Hemiplegia;Abnormal tone;Abnormal postural alignment and control Motor - Discharge Observations: L hemiparesis, hypertonia UE and LE  Mobility Bed Mobility Bed Mobility: Rolling Right;Rolling Left;Sit to Supine;Supine to Sit Rolling Right: Set up assist;Moderate Assistance - Patient 50-74% Rolling Left: Moderate Assistance - Patient 50-74% Supine to Sit: Moderate Assistance - Patient 50-74% Sit to Supine: Moderate Assistance - Patient 50-74% Transfers Transfers: Engineer, civil (consulting) Transfers: Moderate Assistance - Patient 50-74% Lateral/Scoot Transfers: Moderate Assistance - Patient 50-74% Locomotion  Gait Ambulation: Yes Gait Assistance: Maximal Assistance - Patient 25-49% Gait Distance (Feet): 25 Feet Assistive device: Rolling walker Gait Gait: Yes Gait Pattern: Left flexed knee in stance;Narrow base of support;Poor foot clearance - left;Lateral trunk lean to left Stairs / Additional Locomotion Stairs: No Wheelchair Mobility Wheelchair Mobility: Yes Wheelchair Assistance: Minimal assistance - Patient >75% Wheelchair Propulsion: Right upper extremity;Right lower extremity Wheelchair Parts Management: Needs assistance Distance: 185f  Trunk/Postural Assessment  Cervical Assessment Cervical Assessment: Within Functional Limits Thoracic Assessment Thoracic Assessment: Exceptions to WFL(rounded shoulders) Lumbar Assessment Lumbar  Assessment: Exceptions to WFL(posterior pelvic tilt) Postural Control Postural Control: Deficits on evaluation Trunk Control: L lateral lean, Righting Reactions: impaired  Balance Dynamic Sitting Balance Dynamic Sitting - Level of Assistance: 5: Stand by assistance Static Standing Balance Static Standing - Level of Assistance: 4: Min assist Dynamic Standing Balance Dynamic Standing - Level of Assistance: 3: Mod assist(with visual feedback from mirror) Extremity Assessment      RLE Assessment RLE Assessment: Within Functional Limits  General Strength Comments: grossly 4+/5 LLE Assessment LLE Assessment: Exceptions to [facility] Passive Range of Motion (PROM) Comments: tight hamstrings and heel cords, DF limited to neutral Active Range of Motion (AROM) Comments: limited secondary to hemiparesis General Strength Comments: able to bear weight in standing without buckling. poor isolation out of symmetry. LLE Strength Left Hip Flexion: 3/5 Left Hip Extension: 2+/5 Left Knee Flexion: 2/5 Left Knee Extension: 3-/5 Left Ankle Dorsiflexion: 2-/5 Left Ankle Plantar Flexion: 3-/5 LLE Tone LLE Tone: Mild Body Part - Modified Ashworth Scale: Hamstrings    [name] 10/30/2018, 1:24 PM   Magda Kiel, PT 10/31/2018

## 2018-10-30 NOTE — Plan of Care (Signed)
Pt LTG downgraded due to inconsistent progress and limited selective attention.     Barrie Folk PT, DPT

## 2018-10-31 ENCOUNTER — Inpatient Hospital Stay (HOSPITAL_COMMUNITY): Payer: Medicare Other

## 2018-10-31 ENCOUNTER — Inpatient Hospital Stay (HOSPITAL_COMMUNITY): Payer: Medicare Other | Admitting: Occupational Therapy

## 2018-10-31 LAB — GLUCOSE, CAPILLARY
Glucose-Capillary: 118 mg/dL — ABNORMAL HIGH (ref 70–99)
Glucose-Capillary: 128 mg/dL — ABNORMAL HIGH (ref 70–99)
Glucose-Capillary: 115 mg/dL — ABNORMAL HIGH (ref 70–99)
Glucose-Capillary: 126 mg/dL — ABNORMAL HIGH (ref 70–99)

## 2018-10-31 MED ORDER — MIRABEGRON ER 25 MG PO TB24
25.0000 mg | ORAL_TABLET | Freq: Every day | ORAL | Status: DC
  Administered 2018-10-31: 25 mg via ORAL
  Filled 2018-10-31 (×2): qty 1

## 2018-10-31 NOTE — Plan of Care (Signed)
  Problem: RH Balance Goal: LTG Patient will maintain dynamic standing balance (PT) Description: LTG:  Patient will maintain dynamic standing balance with assistance during mobility activities (PT) Outcome: Not Met (add Reason) Note: Due to slow progress & pushing to L   Problem: Sit to Stand Goal: LTG:  Patient will perform sit to stand with assistance level (PT) Description: LTG:  Patient will perform sit to stand with assistance level (PT) Outcome: Not Met (add Reason) Note: Due to slow progress with L HP and pushing L   Problem: RH Car Transfers Goal: LTG Patient will perform car transfers with assist (PT) Description: LTG: Patient will perform car transfers with assistance (PT). Outcome: Not Met (add Reason) Note: Due to slow progress and L hemiparesis and pushing L

## 2018-10-31 NOTE — Discharge Summary (Addendum)
Occupational Therapy Discharge Summary  Patient Details  Name: Rachel Fox MRN: [id number] Date of Birth: 1945/04/17  Today's Date: 10/31/2018 OT Individual Time: 1300-1415 OT Individual Time Calculation (min): 75 min   Patient has met 5 of 11 long term goals due to improved activity tolerance, improved balance, postural control, ability to compensate for deficits and improved coordination.  Patient to discharge at overall Mod-Max Assist level.  Patient's next venue of care is SNF for further rehabilitation.     Reasons goals not met: 6 LTGs were unable to be met for safety reasons due to pts dense Lt hemiplegia, pushing tendencies in standing, and poor awareness of deficits   Recommendation:  Patient will benefit from ongoing skilled OT services in skilled nursing facility setting to continue to advance functional skills in the area of BADL.  Equipment: No equipment provided  Reasons for discharge: discharge from hospital  Patient/family agrees with progress made and goals achieved: Yes   Skilled Therapeutic Intervention:  Pt greeted in bed with no c/o pain. Wanting to get up to use the restroom. Stedy transfer completed with Mod A sit<stand in device. Vcs and manual cuing for midline orientation while semi perched on paddles. When given increased time to void, pt still unable to. Stedy used for transfer off of toilet and transitioning to w/c level bathing at the sink. Pt reported no shoulder pain with L UE HOH for washing Rt side. Mod A for sit<stand and standing balance during self care with Lt knee blocked. Multimodal cuing for minimizing Lt pushing, including using mirror for visual feedback. Able to tolerate modified figure 4 for washing/dressing L LE and figure 4 for R LE. Total A for footwear, including thigh high Teds. Min A for overhead shirt. Oral care completed w/c level with Min A for implementing 1 handed strategies. Throughout session, pt required vcs for motor planning due  to apraxia and perseverative tendencies. She was left in w/c with all needs within reach, half lap tray, Lt resting hand splint, and safety belt fastened.    OT Discharge Precautions/Restrictions  Precautions Precaution Comments: Dense Lt hemi, Rt gaze, pressure injury Vital Signs Therapy Vitals Temp: 97.9 F (36.6 C) Pulse Rate: 70 Resp: 17 BP: (!) 144/77 Patient Position (if appropriate): Sitting Oxygen Therapy SpO2: 99 % O2 Device: Room Air ADL ADL Eating: Minimal assistance(per most recent staff report) Grooming: Minimal assistance Where Assessed-Grooming: Sitting at sink Upper Body Bathing: Moderate assistance Where Assessed-Upper Body Bathing: Sitting at sink Lower Body Bathing: Moderate assistance Where Assessed-Lower Body Bathing: Standing at sink, Sitting at sink Upper Body Dressing: Minimal assistance Where Assessed-Upper Body Dressing: Sitting at sink Lower Body Dressing: Maximal assistance Where Assessed-Lower Body Dressing: Sitting at sink, Standing at sink Toileting: Dependent, Other (Comment)(using Stedy) Where Assessed-Toileting: Glass blower/designer: Maximal assistance(squat pivot) Armed forces technical officer Method: Nutritional therapist: Dependent, Other (comment)(using Stedy) Vision Alignment/Gaze Preference: Gaze right Perception  Perception: Impaired Inattention/Neglect: Does not attend to left visual field;Does not attend to left side of body Praxis Praxis: Impaired Praxis Impairment Details: Initiation;Motor planning;Perseveration Cognition Overall Cognitive Status: Impaired/Different from baseline Arousal/Alertness: Awake/alert Orientation Level: Oriented X4 Attention: Sustained Sustained Attention: Impaired Memory: Impaired Awareness: Impaired Awareness Impairment: Emergent impairment Problem Solving: Impaired Safety/Judgment: Impaired Sensation Coordination Gross Motor Movements are Fluid and Coordinated: No Fine Motor Movements  are Fluid and Coordinated: No Coordination and Movement Description: L hemiparesis, hypertonic L UE Finger Nose Finger Test: Unable to complete with L UE Motor  Motor Motor: Hemiplegia;Abnormal  postural alignment and control;Abnormal tone Motor - Discharge Observations: L hemiparesis, hypertonia UE and LE Mobility    Max A squat pivot BSC transfer, Total A shower transfers using Stedy (Mod A sit<stand in lift) Trunk/Postural Assessment  Cervical Assessment Cervical Assessment: Within Functional Limits Thoracic Assessment Thoracic Assessment: (Rounded shoulders, trunk elongation on the Lt) Lumbar Assessment Lumbar Assessment: Exceptions to WFL(posterior pelvic tilt) Postural Control Postural Control: Deficits on evaluation Trunk Control: L lateral lean,  Balance Balance Balance Assessed: Yes Dynamic Sitting Balance Dynamic Sitting - Level of Assistance: 3: Mod assist(LB dressing) Dynamic Standing Balance Dynamic Standing - Level of Assistance: 3: Mod assist(LB dressing) Extremity/Trunk Assessment RUE Assessment RUE Assessment: Within Functional Limits LUE Assessment LUE Assessment: Exceptions to [facility] Passive Range of Motion (PROM) Comments: PROM limited by pain General Strength Comments: Hypertonic, [place]strom Stage 2   Dedria Endres A Dare Sanger 10/31/2018, 3:34 PM

## 2018-10-31 NOTE — Progress Notes (Signed)
PHYSICAL MEDICINE & REHABILITATION PROGRESS NOTE   Subjective/Complaints:  Pt seen in PT today , no SOB with exercise , still with urinary frequency   ROS: Patient denies+ dry mouth, no abd pain, no N/V/D   Objective:   No results found. No results for input(s): WBC, HGB, HCT, PLT in the last 72 hours. No results for input(s): NA, K, CL, CO2, GLUCOSE, BUN, CREATININE, CALCIUM in the last 72 hours.  Intake/Output Summary (Last 24 hours) at 10/31/2018 0943 Last data filed at 10/30/2018 1852 Gross per 24 hour  Intake 400 ml  Output -  Net 400 ml     Physical Exam: Vital Signs Blood pressure (!) 150/77, pulse 60, temperature 98 F (36.7 C), temperature source Oral, resp. rate 16, height 5\' 3"  (1.6 m), weight 72.1 kg, SpO2 100 %. Constitutional: No distress . Vital signs reviewed. HEENT: EOMI, oral membranes moist Neck: supple Cardiovascular: RRR without murmur. No JVD    Respiratory: CTA occ wheeze on right no resp distress. Normal effort    GI: BS +, non-tender,mildly distended Musc: No edema or tenderness in extremities Neurologic: Alert Motor: Grossly 5/5 RUE/RLE LUE: Grossly 1/5 proximal distal-- LLE: Grossly 2-/5 proximal distal-- Tone 2/4 flexor LUE and extensor LLE Psych: no lability this am   Decub 8/11 top and 7/29 bottom m      Assessment/Plan: 1. Functional deficits secondary to right MCA infarct which require 3+ hours per day of interdisciplinary therapy in a comprehensive inpatient rehab setting.  Physiatrist is providing close team supervision and 24 hour management of active medical problems listed below.  Physiatrist and rehab team continue to assess barriers to discharge/monitor patient progress toward functional and medical goals  Care Tool:  Bathing    Body parts bathed by patient: Chest, Abdomen, Right upper leg, Left upper leg, Face   Body parts bathed by helper: Right arm, Left arm, Front perineal area, Buttocks, Right lower leg,  Left lower leg     Bathing assist Assist Level: Moderate Assistance - Patient 50 - 74%     Upper Body Dressing/Undressing Upper body dressing   What is the patient wearing?: Pull over shirt    Upper body assist Assist Level: Moderate Assistance - Patient 50 - 74%    Lower Body Dressing/Undressing Lower body dressing      What is the patient wearing?: Pants, Incontinence brief     Lower body assist Assist for lower body dressing: Maximal Assistance - Patient 25 - 49%     Toileting Toileting    Toileting assist Assist for toileting: Total Assistance - Patient < 25%(using Stedy)     Transfers Chair/bed transfer  Transfers assist  Chair/bed transfer activity did not occur: Safety/medical concerns  Chair/bed transfer assist level: Moderate Assistance - Patient 50 - 74%     Locomotion Ambulation   Ambulation assist   Ambulation activity did not occur: Safety/medical concerns  Assist level: Maximal Assistance - Patient 25 - 49% Assistive device: Walker-rolling Max distance: 15   Walk 10 feet activity   Assist  Walk 10 feet activity did not occur: Safety/medical concerns  Assist level: Maximal Assistance - Patient 25 - 49% Assistive device: Walker-rolling   Walk 50 feet activity   Assist Walk 50 feet with 2 turns activity did not occur: Safety/medical concerns         Walk 150 feet activity   Assist Walk 150 feet activity did not occur: Safety/medical concerns  Walk 10 feet on uneven surface  activity   Assist Walk 10 feet on uneven surfaces activity did not occur: Safety/medical concerns         Wheelchair     Assist Will patient use wheelchair at discharge?: Yes Type of Wheelchair: Manual    Wheelchair assist level: Minimal Assistance - Patient > 75% Max wheelchair distance: 100    Wheelchair 50 feet with 2 turns activity    Assist        Assist Level: Minimal Assistance - Patient > 75%   Wheelchair 150  feet activity     Assist Wheelchair 150 feet activity did not occur: Safety/medical concerns   Assist Level: Minimal Assistance - Patient > 75%    Medical Problem List and Plan: 1.Lefthemiparesis, dysphagia, and visual-spatial deficitssecondary to right MCA infarction 7/6, now with spasticity  Bed offer from SNF plan to d/c in am, pt is stable   2. Antithrombotics: -DVT/anticoagulation:Continue Eliquis -antiplatelet therapy: N/A 3. Pain Management:Tylenol as needed-  topamax for HA with some benefit increased to BID-improving -maintain proper shoulder position and support when in bed and chair.  May need Botox to pec or possibly corticosteroid can be done as OP  -Voltaren gel  -added tramadol 50mg  q12 prn for severe pain 4. Mood:Provide emotional support -antipsychotic agents: N/A  -neuro-psych input would be helpful  -likely some disinhibition related to location of CVA  -?reactive anxiety/depression also an issue--add prn xanax low dose 5. Neuropsych: This patientiscapable of making decisions on herown behalf. 6. Skin/Wound Care:Routine skin checks. Aquacel, gen surg evaled there is no evidence of infection but some necrotic tissue noted.  May benefit from hydro therapy  7. Fluids/Electrolytes/Nutrition:Routine in and outs 8.Atrial fibrillation. Amiodarone 200 mg twice daily, Lanoxin 0.125 mg daily, Cardizem 90 mg 4 times daily.   Vitals:   10/30/18 2014 10/31/18 0636  BP: 140/77 (!) 150/77  Pulse: 65 60  Resp: 15 16  Temp: 98 F (36.7 C) 98 F (36.7 C)  SpO2: 100% 570%   Mild systolic BP elevation but  Overall controlled  9.  Post strokedysphagia. Dysphagia #2, thin liquids  Advance diet per SLP 10. Diabetes mellitus. Hemoglobin A1c 6.1. SSI. Patient on Glucophage 500 mg twice daily prior to admission. Resume as indicated CBG (last 3)  Recent Labs    10/30/18 1636 10/30/18 2113 10/31/18 0635  GLUCAP 99  116* 118*  Controlled 8/16 11.Rhabdomyolysis. no sign of renal failure  12. Hyperlipidemia. Lipitor 13. Constipation. KUB mod stool burden cont laxatives15.  Hypoalbuminemia  Supplement initiated      14. Urinary frequency: had some at baseline  -PVR's generally low , trial myrbetriq   LOS: 34 days A FACE TO [place] E Kirsteins 10/31/2018, 9:43 AM

## 2018-10-31 NOTE — Progress Notes (Signed)
Physical Therapy Session Note  Patient Details  Name: Rachel Fox MRN: [id number] Date of Birth: 1946-01-12  Today's Date: 10/31/2018 PT Individual Time: 0900-1000 PT Individual Time Calculation (min): 60 min   Short Term Goals: Week 4:  PT Short Term Goal 1 (Week 4): STG=LTG due to ELOS  Skilled Therapeutic Interventions/Progress Updates:    Patient in supine with RN delivering medication.  Assisted in supine to apply TED stockings and supine to sit mod A for trunk with cues and time.  Able to sit EOB while assisted to don pants and shoes with cues for attention to balance.  Patient sit to stand to hemiwalker to pull up pants max A sit to stand and total assist to pull up pants.  Patient stand pivot to w/c with HW and max A.  Performed w/c propulsion min A x 100' with cues and increased time using hemitechnique.  In therapy gym sit to stand and ambulated about 5' with RW with L hand splint and max A for balance with ace wrap and shoe cover over L foot pushing hard to L.  Assisted at wall rail with max to mod A for about 15' with cues for weight shift, balance and L attention.  Patient assisted in w/c to room and sit to stand to sink with assist to doff soiled brief and apply clean one while in standing.  Second sit to stand to pull up pants.  Positioned in w/c for comfort and safety and left with seat belt alarm, call bell in reach.   Therapy Documentation Precautions:  Precautions Precautions: Fall Precaution Comments: L side weak/flaccid, right gaze preference, L shoulder pain (risk for subluxation), pressure injury from laying on floor PTA Restrictions Weight Bearing Restrictions: No Pain: Pain Assessment Pain Score: 6  Pain Type: Acute pain Pain Location: Wrist Pain Orientation: Left Pain Descriptors / Indicators: Aching;Sore Pain Onset: On-going Pain Intervention(s): Splinting    Therapy/Group: Individual Therapy  Reginia Naas  Magda Kiel, PT 10/31/2018, 10:52 AM

## 2018-10-31 NOTE — Plan of Care (Signed)
  Problem: RH Eating Goal: LTG Patient will perform eating w/assist, cues/equip (OT) Description: LTG: Patient will perform eating with assist, with/without cues using equipment (OT) Outcome: Not Met (add Reason) Note: Due to slow progress    Problem: RH Grooming Goal: LTG Patient will perform grooming w/assist,cues/equip (OT) Description: LTG: Patient will perform grooming with assist, with/without cues using equipment (OT) Outcome: Not Met (add Reason) Note: Due to slow progress    Problem: RH Bathing Goal: LTG Patient will bathe all body parts with assist levels (OT) Description: LTG: Patient will bathe all body parts with assist levels (OT) Outcome: Not Met (add Reason) Note: Due to slow progress    Problem: RH Dressing Goal: LTG Patient will perform lower body dressing w/assist (OT) Description: LTG: Patient will perform lower body dressing with assist, with/without cues in positioning using equipment (OT) Outcome: Not Met (add Reason) Note: Due to slow progress    Problem: RH Toileting Goal: LTG Patient will perform toileting task (3/3 steps) with assistance level (OT) Description: LTG: Patient will perform toileting task (3/3 steps) with assistance level (OT)  Outcome: Not Met (add Reason) Note: Due to slow progress    Problem: RH Toilet Transfers Goal: LTG Patient will perform toilet transfers w/assist (OT) Description: LTG: Patient will perform toilet transfers with assist, with/without cues using equipment (OT) Outcome: Not Met (add Reason) Note: Due to slow progress    Problem: RH Tub/Shower Transfers Goal: LTG Patient will perform tub/shower transfers w/assist (OT) Description: LTG: Patient will perform tub/shower transfers with assist, with/without cues using equipment (OT) Outcome: Not Met (add Reason) Note: Due to slow progress

## 2018-11-01 DIAGNOSIS — R52 Pain, unspecified: Secondary | ICD-10-CM | POA: Diagnosis not present

## 2018-11-01 DIAGNOSIS — E7849 Other hyperlipidemia: Secondary | ICD-10-CM | POA: Diagnosis present

## 2018-11-01 DIAGNOSIS — Z1159 Encounter for screening for other viral diseases: Secondary | ICD-10-CM | POA: Diagnosis not present

## 2018-11-01 DIAGNOSIS — Z03818 Encounter for observation for suspected exposure to other biological agents ruled out: Secondary | ICD-10-CM | POA: Diagnosis not present

## 2018-11-01 DIAGNOSIS — R339 Retention of urine, unspecified: Secondary | ICD-10-CM | POA: Diagnosis not present

## 2018-11-01 DIAGNOSIS — R1310 Dysphagia, unspecified: Secondary | ICD-10-CM | POA: Diagnosis not present

## 2018-11-01 DIAGNOSIS — I4891 Unspecified atrial fibrillation: Secondary | ICD-10-CM | POA: Diagnosis present

## 2018-11-01 DIAGNOSIS — L89323 Pressure ulcer of left buttock, stage 3: Secondary | ICD-10-CM | POA: Diagnosis not present

## 2018-11-01 DIAGNOSIS — I4819 Other persistent atrial fibrillation: Secondary | ICD-10-CM | POA: Diagnosis not present

## 2018-11-01 DIAGNOSIS — I499 Cardiac arrhythmia, unspecified: Secondary | ICD-10-CM | POA: Diagnosis not present

## 2018-11-01 DIAGNOSIS — I63511 Cerebral infarction due to unspecified occlusion or stenosis of right middle cerebral artery: Secondary | ICD-10-CM | POA: Diagnosis not present

## 2018-11-01 DIAGNOSIS — G8190 Hemiplegia, unspecified affecting unspecified side: Secondary | ICD-10-CM | POA: Diagnosis not present

## 2018-11-01 DIAGNOSIS — M2550 Pain in unspecified joint: Secondary | ICD-10-CM | POA: Diagnosis not present

## 2018-11-01 DIAGNOSIS — F329 Major depressive disorder, single episode, unspecified: Secondary | ICD-10-CM | POA: Diagnosis not present

## 2018-11-01 DIAGNOSIS — I69354 Hemiplegia and hemiparesis following cerebral infarction affecting left non-dominant side: Secondary | ICD-10-CM | POA: Diagnosis not present

## 2018-11-01 DIAGNOSIS — Z111 Encounter for screening for respiratory tuberculosis: Secondary | ICD-10-CM | POA: Diagnosis not present

## 2018-11-01 DIAGNOSIS — Z7901 Long term (current) use of anticoagulants: Secondary | ICD-10-CM | POA: Diagnosis not present

## 2018-11-01 DIAGNOSIS — G8929 Other chronic pain: Secondary | ICD-10-CM | POA: Diagnosis not present

## 2018-11-01 DIAGNOSIS — R531 Weakness: Secondary | ICD-10-CM | POA: Diagnosis not present

## 2018-11-01 DIAGNOSIS — E118 Type 2 diabetes mellitus with unspecified complications: Secondary | ICD-10-CM | POA: Diagnosis not present

## 2018-11-01 DIAGNOSIS — R41841 Cognitive communication deficit: Secondary | ICD-10-CM | POA: Diagnosis not present

## 2018-11-01 DIAGNOSIS — N318 Other neuromuscular dysfunction of bladder: Secondary | ICD-10-CM | POA: Diagnosis not present

## 2018-11-01 DIAGNOSIS — Z743 Need for continuous supervision: Secondary | ICD-10-CM | POA: Diagnosis not present

## 2018-11-01 DIAGNOSIS — F3289 Other specified depressive episodes: Secondary | ICD-10-CM | POA: Diagnosis not present

## 2018-11-01 DIAGNOSIS — N3281 Overactive bladder: Secondary | ICD-10-CM | POA: Diagnosis not present

## 2018-11-01 DIAGNOSIS — L89329 Pressure ulcer of left buttock, unspecified stage: Secondary | ICD-10-CM | POA: Diagnosis not present

## 2018-11-01 DIAGNOSIS — Z741 Need for assistance with personal care: Secondary | ICD-10-CM | POA: Diagnosis not present

## 2018-11-01 DIAGNOSIS — E119 Type 2 diabetes mellitus without complications: Secondary | ICD-10-CM | POA: Diagnosis not present

## 2018-11-01 DIAGNOSIS — I69322 Dysarthria following cerebral infarction: Secondary | ICD-10-CM | POA: Diagnosis not present

## 2018-11-01 DIAGNOSIS — G8110 Spastic hemiplegia affecting unspecified side: Secondary | ICD-10-CM | POA: Diagnosis present

## 2018-11-01 DIAGNOSIS — I639 Cerebral infarction, unspecified: Secondary | ICD-10-CM | POA: Diagnosis present

## 2018-11-01 DIAGNOSIS — Z794 Long term (current) use of insulin: Secondary | ICD-10-CM | POA: Diagnosis not present

## 2018-11-01 DIAGNOSIS — F419 Anxiety disorder, unspecified: Secondary | ICD-10-CM | POA: Diagnosis not present

## 2018-11-01 DIAGNOSIS — G8114 Spastic hemiplegia affecting left nondominant side: Secondary | ICD-10-CM | POA: Diagnosis not present

## 2018-11-01 DIAGNOSIS — F4323 Adjustment disorder with mixed anxiety and depressed mood: Secondary | ICD-10-CM | POA: Diagnosis not present

## 2018-11-01 DIAGNOSIS — K5900 Constipation, unspecified: Secondary | ICD-10-CM | POA: Diagnosis not present

## 2018-11-01 DIAGNOSIS — I69398 Other sequelae of cerebral infarction: Secondary | ICD-10-CM | POA: Diagnosis not present

## 2018-11-01 DIAGNOSIS — L89153 Pressure ulcer of sacral region, stage 3: Secondary | ICD-10-CM | POA: Diagnosis not present

## 2018-11-01 DIAGNOSIS — R279 Unspecified lack of coordination: Secondary | ICD-10-CM | POA: Diagnosis not present

## 2018-11-01 DIAGNOSIS — M792 Neuralgia and neuritis, unspecified: Secondary | ICD-10-CM | POA: Diagnosis not present

## 2018-11-01 DIAGNOSIS — Z7401 Bed confinement status: Secondary | ICD-10-CM | POA: Diagnosis not present

## 2018-11-01 DIAGNOSIS — I10 Essential (primary) hypertension: Secondary | ICD-10-CM | POA: Diagnosis not present

## 2018-11-01 DIAGNOSIS — F29 Unspecified psychosis not due to a substance or known physiological condition: Secondary | ICD-10-CM | POA: Diagnosis not present

## 2018-11-01 DIAGNOSIS — R5381 Other malaise: Secondary | ICD-10-CM | POA: Diagnosis not present

## 2018-11-01 DIAGNOSIS — I469 Cardiac arrest, cause unspecified: Secondary | ICD-10-CM | POA: Diagnosis not present

## 2018-11-01 DIAGNOSIS — M62838 Other muscle spasm: Secondary | ICD-10-CM | POA: Diagnosis not present

## 2018-11-01 DIAGNOSIS — E559 Vitamin D deficiency, unspecified: Secondary | ICD-10-CM | POA: Diagnosis not present

## 2018-11-01 DIAGNOSIS — I69391 Dysphagia following cerebral infarction: Secondary | ICD-10-CM | POA: Diagnosis not present

## 2018-11-01 DIAGNOSIS — E785 Hyperlipidemia, unspecified: Secondary | ICD-10-CM | POA: Diagnosis not present

## 2018-11-01 DIAGNOSIS — M6281 Muscle weakness (generalized): Secondary | ICD-10-CM | POA: Diagnosis not present

## 2018-11-01 DIAGNOSIS — R2689 Other abnormalities of gait and mobility: Secondary | ICD-10-CM | POA: Diagnosis not present

## 2018-11-01 LAB — GLUCOSE, CAPILLARY
Glucose-Capillary: 108 mg/dL — ABNORMAL HIGH (ref 70–99)
Glucose-Capillary: 128 mg/dL — ABNORMAL HIGH (ref 70–99)

## 2018-11-01 MED ORDER — MIRABEGRON ER 25 MG PO TB24: 25.0000 mg | ORAL_TABLET | Freq: Every day | ORAL | Status: DC

## 2018-11-01 NOTE — Progress Notes (Signed)
Social Work Discharge Note   The overall goal for the admission was met for:   Discharge location: No-[facility]  Length of Stay: Yes-35 DAYS  Discharge activity level: Yes-MIN/MOD LEVEL  Home/community participation: Yes  Services provided included: MD, RD, PT, OT, SLP, RN, CM, Pharmacy, Neuropsych and SW  Financial Services: Medicare and Private Insurance: [place]  Follow-up services arranged: Other: NHP  Comments (or additional information):FAMILYW AS HERE LAST WEEK TO SEE IF COULD MANAGE PT IN HOME ENVIRONMENT. ALL FEEL BEST OPTION IS SHORT TERM NHP AND THEN HOME.  Patient/Family verbalized understanding of follow-up arrangements: Yes  Individual responsible for coordination of the follow-up plan: JERRY-SON  Confirmed correct DME delivered: Rachel Fox 11/01/2018    Rachel Fox

## 2018-11-01 NOTE — Progress Notes (Signed)
[place] PHYSICAL MEDICINE & REHABILITATION PROGRESS NOTE   Subjective/Complaints:  Up in chair. Seems a bit more confused today.   ROS: Limited due to cognitive/behavioral    Objective:   No results found. No results for input(s): WBC, HGB, HCT, PLT in the last 72 hours. No results for input(s): NA, K, CL, CO2, GLUCOSE, BUN, CREATININE, CALCIUM in the last 72 hours.  Intake/Output Summary (Last 24 hours) at 11/01/2018 1105 Last data filed at 11/01/2018 0829 Gross per 24 hour  Intake 340 ml  Output -  Net 340 ml     Physical Exam: Vital Signs Blood pressure (!) 141/69, pulse 60, temperature 98 F (36.7 C), temperature source Oral, resp. rate 16, height 5\' 3"  (1.6 m), weight 72.1 kg, SpO2 97 %. Constitutional: No distress . Vital signs reviewed. HEENT: EOMI, oral membranes moist Neck: supple Cardiovascular: RRR without murmur. No JVD    Respiratory: CTA Bilaterally without wheezes or rales. Normal effort    GI: BS +, non-tender, non-distended  Musc: No edema or tenderness in extremities Neurologic: Alert Motor: Grossly 5/5 RUE/RLE LUE: Grossly 1/5 proximal distal-- LLE: Grossly 2-/5 proximal distal-- Tone 2/4 flexor LUE and extensor LLE Psych: no lability this am  Sacral wound depressed   Assessment/Plan: 1. Functional deficits secondary to right MCA infarct which require 3+ hours per day of interdisciplinary therapy in a comprehensive inpatient rehab setting.  Physiatrist is providing close team supervision and 24 hour management of active medical problems listed below.  Physiatrist and rehab team continue to assess barriers to discharge/monitor patient progress toward functional and medical goals  Care Tool:  Bathing    Body parts bathed by patient: Chest, Abdomen, Right upper leg, Left upper leg, Face   Body parts bathed by helper: Right arm, Left arm, Front perineal area, Buttocks, Right lower leg, Left lower leg     Bathing assist Assist Level: Moderate  Assistance - Patient 50 - 74%     Upper Body Dressing/Undressing Upper body dressing   What is the patient wearing?: Pull over shirt    Upper body assist Assist Level: Minimal Assistance - Patient > 75%    Lower Body Dressing/Undressing Lower body dressing      What is the patient wearing?: Pants, Incontinence brief     Lower body assist Assist for lower body dressing: Maximal Assistance - Patient 25 - 49%     Toileting Toileting    Toileting assist Assist for toileting: Total Assistance - Patient < 25%(using Stedy)     Transfers Chair/bed transfer  Transfers assist  Chair/bed transfer activity did not occur: Safety/medical concerns  Chair/bed transfer assist level: Moderate Assistance - Patient 50 - 74%     Locomotion Ambulation   Ambulation assist   Ambulation activity did not occur: Safety/medical concerns  Assist level: Maximal Assistance - Patient 25 - 49% Assistive device: Other (comment)(wall rail) Max distance: 15   Walk 10 feet activity   Assist  Walk 10 feet activity did not occur: Safety/medical concerns  Assist level: Maximal Assistance - Patient 25 - 49% Assistive device: Other (comment)(wall rail)   Walk 50 feet activity   Assist Walk 50 feet with 2 turns activity did not occur: Safety/medical concerns         Walk 150 feet activity   Assist Walk 150 feet activity did not occur: Safety/medical concerns         Walk 10 feet on uneven surface  activity   Assist Walk 10 feet on uneven  surfaces activity did not occur: Safety/medical concerns         Wheelchair     Assist Will patient use wheelchair at discharge?: Yes Type of Wheelchair: Manual    Wheelchair assist level: Minimal Assistance - Patient > 75% Max wheelchair distance: 100    Wheelchair 50 feet with 2 turns activity    Assist        Assist Level: Minimal Assistance - Patient > 75%   Wheelchair 150 feet activity     Assist Wheelchair  150 feet activity did not occur: Safety/medical concerns   Assist Level: Minimal Assistance - Patient > 75%    Medical Problem List and Plan: 1.Lefthemiparesis, dysphagia, and visual-spatial deficitssecondary to right MCA infarction 7/6, now with spasticity  Bed offer from SNF--dc today  2. Antithrombotics: -DVT/anticoagulation:Continue Eliquis -antiplatelet therapy: N/A 3. Pain Management:Tylenol as needed-  topamax for HA with some benefit increased to BID-improving -maintain proper shoulder position and support when in bed and chair.  May need Botox to pec or possibly corticosteroid can be done as OP  -Voltaren gel  -added tramadol 50mg  q12 prn for severe pain 4. Mood:Provide emotional support -antipsychotic agents: N/A  -neuro-psych input would be helpful  -likely some disinhibition related to location of CVA  -?reactive anxiety/depression also an issue--add prn xanax low dose 5. Neuropsych: This patientiscapable of making decisions on herown behalf. 6. Skin/Wound Care:Routine skin checks. Aquacel, gen surg evaled there is no evidence of infection but some necrotic tissue noted. --wound care follow up at SNF, may need wound care ctr follow up also 7. Fluids/Electrolytes/Nutrition:Routine in and outs 8.Atrial fibrillation. Amiodarone 200 mg twice daily, Lanoxin 0.125 mg daily, Cardizem 90 mg 4 times daily.   Vitals:   10/31/18 1941 11/01/18 0304  BP: (!) 141/73 (!) 141/69  Pulse: 64 60  Resp: 17 16  Temp: 97.7 F (36.5 C) 98 F (36.7 C)  SpO2: 97% 97%   Mild systolic BP elevation but  Overall controlled  9.  Post strokedysphagia. Dysphagia #2, thin liquids  Advance diet per SLP 10. Diabetes mellitus. Hemoglobin A1c 6.1. SSI. Patient on Glucophage 500 mg twice daily prior to admission. Resume as indicated CBG (last 3)  Recent Labs    10/31/18 1655 10/31/18 2038 11/01/18 0640  GLUCAP 115* 126* 108*   Controlled 8/17 11.Rhabdomyolysis. no sign of renal failure  12. Hyperlipidemia. Lipitor 13. Constipation. KUB mod stool burden cont laxatives 15.  Hypoalbuminemia  Supplement initiated      14. Urinary frequency: had some at baseline  -PVR's generally low , trial of myrbetriq   LOS: 35 days A FACE TO FACE EVALUATION WAS PERFORMED  Rachel Fox 11/01/2018, 11:05 AM

## 2018-11-01 NOTE — Progress Notes (Signed)
Report given to Harriette Bouillon at [facility]. No further questions noted by lisa, patient is waiting transportation at 1pm.

## 2018-11-03 ENCOUNTER — Telehealth: Payer: Self-pay | Admitting: Cardiology

## 2018-11-03 DIAGNOSIS — R52 Pain, unspecified: Secondary | ICD-10-CM | POA: Diagnosis not present

## 2018-11-03 DIAGNOSIS — I4891 Unspecified atrial fibrillation: Secondary | ICD-10-CM | POA: Diagnosis not present

## 2018-11-03 DIAGNOSIS — M6281 Muscle weakness (generalized): Secondary | ICD-10-CM | POA: Diagnosis not present

## 2018-11-03 DIAGNOSIS — N3281 Overactive bladder: Secondary | ICD-10-CM | POA: Diagnosis not present

## 2018-11-03 DIAGNOSIS — F419 Anxiety disorder, unspecified: Secondary | ICD-10-CM | POA: Diagnosis not present

## 2018-11-03 DIAGNOSIS — M62838 Other muscle spasm: Secondary | ICD-10-CM | POA: Diagnosis not present

## 2018-11-03 DIAGNOSIS — G8929 Other chronic pain: Secondary | ICD-10-CM | POA: Diagnosis not present

## 2018-11-03 DIAGNOSIS — I10 Essential (primary) hypertension: Secondary | ICD-10-CM | POA: Diagnosis not present

## 2018-11-03 DIAGNOSIS — E785 Hyperlipidemia, unspecified: Secondary | ICD-10-CM | POA: Diagnosis not present

## 2018-11-03 NOTE — Telephone Encounter (Signed)
Please give Rachel Fox at [facility] a call [phone] concerning pt's   amiodarone (PACERONE) 200 MG tablet [[id number]] and   digoxin (LANOXIN) 0.125 MG tablet [[id number]]   And she also has a question regarding the diltiazem (CARDIZEM) 90 MG tablet [[id number]] ENDED

## 2018-11-04 NOTE — Telephone Encounter (Signed)
Returned pt call. No answer, voicemail full.

## 2018-11-05 ENCOUNTER — Telehealth: Payer: Self-pay | Admitting: Student

## 2018-11-05 DIAGNOSIS — N318 Other neuromuscular dysfunction of bladder: Secondary | ICD-10-CM | POA: Diagnosis not present

## 2018-11-05 DIAGNOSIS — E118 Type 2 diabetes mellitus with unspecified complications: Secondary | ICD-10-CM | POA: Diagnosis not present

## 2018-11-05 DIAGNOSIS — M792 Neuralgia and neuritis, unspecified: Secondary | ICD-10-CM | POA: Diagnosis not present

## 2018-11-05 DIAGNOSIS — I4891 Unspecified atrial fibrillation: Secondary | ICD-10-CM | POA: Diagnosis not present

## 2018-11-05 DIAGNOSIS — G8114 Spastic hemiplegia affecting left nondominant side: Secondary | ICD-10-CM | POA: Diagnosis not present

## 2018-11-05 DIAGNOSIS — Z741 Need for assistance with personal care: Secondary | ICD-10-CM | POA: Diagnosis not present

## 2018-11-05 DIAGNOSIS — F3289 Other specified depressive episodes: Secondary | ICD-10-CM | POA: Diagnosis not present

## 2018-11-05 DIAGNOSIS — I69391 Dysphagia following cerebral infarction: Secondary | ICD-10-CM | POA: Diagnosis not present

## 2018-11-05 DIAGNOSIS — R2689 Other abnormalities of gait and mobility: Secondary | ICD-10-CM | POA: Diagnosis not present

## 2018-11-05 DIAGNOSIS — R52 Pain, unspecified: Secondary | ICD-10-CM | POA: Diagnosis not present

## 2018-11-05 DIAGNOSIS — L89323 Pressure ulcer of left buttock, stage 3: Secondary | ICD-10-CM | POA: Diagnosis not present

## 2018-11-05 NOTE — Telephone Encounter (Signed)
Called number requested, it is wrong number.

## 2018-11-05 NOTE — Telephone Encounter (Signed)
Regarding patient not being able to swallow Cardizem pills / tg

## 2018-11-05 NOTE — Telephone Encounter (Signed)
Returned call. No answer. Left message

## 2018-11-08 DIAGNOSIS — I10 Essential (primary) hypertension: Secondary | ICD-10-CM | POA: Diagnosis not present

## 2018-11-08 DIAGNOSIS — G8929 Other chronic pain: Secondary | ICD-10-CM | POA: Diagnosis not present

## 2018-11-08 DIAGNOSIS — R52 Pain, unspecified: Secondary | ICD-10-CM | POA: Diagnosis not present

## 2018-11-08 DIAGNOSIS — E785 Hyperlipidemia, unspecified: Secondary | ICD-10-CM | POA: Diagnosis not present

## 2018-11-08 DIAGNOSIS — F419 Anxiety disorder, unspecified: Secondary | ICD-10-CM | POA: Diagnosis not present

## 2018-11-08 DIAGNOSIS — M6281 Muscle weakness (generalized): Secondary | ICD-10-CM | POA: Diagnosis not present

## 2018-11-08 DIAGNOSIS — M62838 Other muscle spasm: Secondary | ICD-10-CM | POA: Diagnosis not present

## 2018-11-08 DIAGNOSIS — N3281 Overactive bladder: Secondary | ICD-10-CM | POA: Diagnosis not present

## 2018-11-08 DIAGNOSIS — I4891 Unspecified atrial fibrillation: Secondary | ICD-10-CM | POA: Diagnosis not present

## 2018-11-09 NOTE — Telephone Encounter (Signed)
Returned call. No amswer, left detailed pt for them to return call.

## 2018-11-10 DIAGNOSIS — G8929 Other chronic pain: Secondary | ICD-10-CM | POA: Diagnosis not present

## 2018-11-10 DIAGNOSIS — R52 Pain, unspecified: Secondary | ICD-10-CM | POA: Diagnosis not present

## 2018-11-10 DIAGNOSIS — Z741 Need for assistance with personal care: Secondary | ICD-10-CM | POA: Diagnosis not present

## 2018-11-10 DIAGNOSIS — I4891 Unspecified atrial fibrillation: Secondary | ICD-10-CM | POA: Diagnosis not present

## 2018-11-10 DIAGNOSIS — G8114 Spastic hemiplegia affecting left nondominant side: Secondary | ICD-10-CM | POA: Diagnosis not present

## 2018-11-10 DIAGNOSIS — N3281 Overactive bladder: Secondary | ICD-10-CM | POA: Diagnosis not present

## 2018-11-10 DIAGNOSIS — I10 Essential (primary) hypertension: Secondary | ICD-10-CM | POA: Diagnosis not present

## 2018-11-10 DIAGNOSIS — E785 Hyperlipidemia, unspecified: Secondary | ICD-10-CM | POA: Diagnosis not present

## 2018-11-10 DIAGNOSIS — F419 Anxiety disorder, unspecified: Secondary | ICD-10-CM | POA: Diagnosis not present

## 2018-11-10 DIAGNOSIS — R41841 Cognitive communication deficit: Secondary | ICD-10-CM | POA: Diagnosis not present

## 2018-11-10 DIAGNOSIS — R2689 Other abnormalities of gait and mobility: Secondary | ICD-10-CM | POA: Diagnosis not present

## 2018-11-10 DIAGNOSIS — M6281 Muscle weakness (generalized): Secondary | ICD-10-CM | POA: Diagnosis not present

## 2018-11-15 DIAGNOSIS — R1310 Dysphagia, unspecified: Secondary | ICD-10-CM | POA: Diagnosis not present

## 2018-11-15 DIAGNOSIS — N3281 Overactive bladder: Secondary | ICD-10-CM | POA: Diagnosis not present

## 2018-11-15 DIAGNOSIS — E559 Vitamin D deficiency, unspecified: Secondary | ICD-10-CM | POA: Diagnosis not present

## 2018-11-15 DIAGNOSIS — I10 Essential (primary) hypertension: Secondary | ICD-10-CM | POA: Diagnosis not present

## 2018-11-15 DIAGNOSIS — M6281 Muscle weakness (generalized): Secondary | ICD-10-CM | POA: Diagnosis not present

## 2018-11-15 DIAGNOSIS — F419 Anxiety disorder, unspecified: Secondary | ICD-10-CM | POA: Diagnosis not present

## 2018-11-15 DIAGNOSIS — K5900 Constipation, unspecified: Secondary | ICD-10-CM | POA: Diagnosis not present

## 2018-11-15 DIAGNOSIS — R41841 Cognitive communication deficit: Secondary | ICD-10-CM | POA: Diagnosis not present

## 2018-11-15 DIAGNOSIS — G8114 Spastic hemiplegia affecting left nondominant side: Secondary | ICD-10-CM | POA: Diagnosis not present

## 2018-11-15 DIAGNOSIS — E785 Hyperlipidemia, unspecified: Secondary | ICD-10-CM | POA: Diagnosis not present

## 2018-11-15 DIAGNOSIS — I4891 Unspecified atrial fibrillation: Secondary | ICD-10-CM | POA: Diagnosis not present

## 2018-11-15 DIAGNOSIS — R52 Pain, unspecified: Secondary | ICD-10-CM | POA: Diagnosis not present

## 2018-11-16 ENCOUNTER — Encounter (HOSPITAL_BASED_OUTPATIENT_CLINIC_OR_DEPARTMENT_OTHER): Payer: No Typology Code available for payment source | Attending: Internal Medicine

## 2018-11-16 ENCOUNTER — Other Ambulatory Visit: Payer: Self-pay

## 2018-11-16 DIAGNOSIS — L89329 Pressure ulcer of left buttock, unspecified stage: Secondary | ICD-10-CM | POA: Diagnosis not present

## 2018-11-16 DIAGNOSIS — E119 Type 2 diabetes mellitus without complications: Secondary | ICD-10-CM | POA: Insufficient documentation

## 2018-11-16 DIAGNOSIS — F329 Major depressive disorder, single episode, unspecified: Secondary | ICD-10-CM | POA: Diagnosis not present

## 2018-11-16 DIAGNOSIS — I69354 Hemiplegia and hemiparesis following cerebral infarction affecting left non-dominant side: Secondary | ICD-10-CM | POA: Insufficient documentation

## 2018-11-16 DIAGNOSIS — R531 Weakness: Secondary | ICD-10-CM | POA: Diagnosis not present

## 2018-11-16 DIAGNOSIS — Z794 Long term (current) use of insulin: Secondary | ICD-10-CM | POA: Insufficient documentation

## 2018-11-16 DIAGNOSIS — R279 Unspecified lack of coordination: Secondary | ICD-10-CM | POA: Diagnosis not present

## 2018-11-16 DIAGNOSIS — R5381 Other malaise: Secondary | ICD-10-CM | POA: Diagnosis not present

## 2018-11-16 DIAGNOSIS — G8190 Hemiplegia, unspecified affecting unspecified side: Secondary | ICD-10-CM | POA: Diagnosis not present

## 2018-11-16 DIAGNOSIS — F4323 Adjustment disorder with mixed anxiety and depressed mood: Secondary | ICD-10-CM | POA: Diagnosis not present

## 2018-11-16 DIAGNOSIS — R52 Pain, unspecified: Secondary | ICD-10-CM | POA: Diagnosis not present

## 2018-11-16 DIAGNOSIS — M6281 Muscle weakness (generalized): Secondary | ICD-10-CM | POA: Diagnosis not present

## 2018-11-16 DIAGNOSIS — I4891 Unspecified atrial fibrillation: Secondary | ICD-10-CM | POA: Diagnosis present

## 2018-11-16 DIAGNOSIS — I10 Essential (primary) hypertension: Secondary | ICD-10-CM | POA: Diagnosis not present

## 2018-11-16 DIAGNOSIS — I69391 Dysphagia following cerebral infarction: Secondary | ICD-10-CM | POA: Diagnosis not present

## 2018-11-16 DIAGNOSIS — N3281 Overactive bladder: Secondary | ICD-10-CM | POA: Diagnosis not present

## 2018-11-16 DIAGNOSIS — R1310 Dysphagia, unspecified: Secondary | ICD-10-CM | POA: Diagnosis not present

## 2018-11-16 DIAGNOSIS — I69322 Dysarthria following cerebral infarction: Secondary | ICD-10-CM | POA: Diagnosis not present

## 2018-11-16 DIAGNOSIS — L89323 Pressure ulcer of left buttock, stage 3: Secondary | ICD-10-CM | POA: Diagnosis present

## 2018-11-16 DIAGNOSIS — I639 Cerebral infarction, unspecified: Secondary | ICD-10-CM | POA: Diagnosis present

## 2018-11-16 DIAGNOSIS — I69398 Other sequelae of cerebral infarction: Secondary | ICD-10-CM | POA: Diagnosis not present

## 2018-11-16 DIAGNOSIS — Z7901 Long term (current) use of anticoagulants: Secondary | ICD-10-CM | POA: Diagnosis not present

## 2018-11-16 DIAGNOSIS — K5900 Constipation, unspecified: Secondary | ICD-10-CM | POA: Diagnosis not present

## 2018-11-16 DIAGNOSIS — F29 Unspecified psychosis not due to a substance or known physiological condition: Secondary | ICD-10-CM | POA: Diagnosis not present

## 2018-11-16 DIAGNOSIS — I4819 Other persistent atrial fibrillation: Secondary | ICD-10-CM | POA: Diagnosis not present

## 2018-11-16 DIAGNOSIS — F419 Anxiety disorder, unspecified: Secondary | ICD-10-CM | POA: Diagnosis not present

## 2018-11-16 DIAGNOSIS — G8110 Spastic hemiplegia affecting unspecified side: Secondary | ICD-10-CM | POA: Diagnosis present

## 2018-11-16 DIAGNOSIS — Z03818 Encounter for observation for suspected exposure to other biological agents ruled out: Secondary | ICD-10-CM | POA: Diagnosis not present

## 2018-11-16 DIAGNOSIS — Z1159 Encounter for screening for other viral diseases: Secondary | ICD-10-CM | POA: Diagnosis not present

## 2018-11-16 DIAGNOSIS — E7849 Other hyperlipidemia: Secondary | ICD-10-CM | POA: Diagnosis present

## 2018-11-16 DIAGNOSIS — Z743 Need for continuous supervision: Secondary | ICD-10-CM | POA: Diagnosis not present

## 2018-11-16 DIAGNOSIS — R339 Retention of urine, unspecified: Secondary | ICD-10-CM | POA: Diagnosis not present

## 2018-11-16 DIAGNOSIS — I499 Cardiac arrhythmia, unspecified: Secondary | ICD-10-CM | POA: Diagnosis not present

## 2018-11-16 DIAGNOSIS — L89153 Pressure ulcer of sacral region, stage 3: Secondary | ICD-10-CM | POA: Diagnosis not present

## 2018-11-16 DIAGNOSIS — M2550 Pain in unspecified joint: Secondary | ICD-10-CM | POA: Diagnosis not present

## 2018-11-16 DIAGNOSIS — Z7401 Bed confinement status: Secondary | ICD-10-CM | POA: Diagnosis not present

## 2018-11-16 DIAGNOSIS — R41841 Cognitive communication deficit: Secondary | ICD-10-CM | POA: Diagnosis not present

## 2018-11-16 DIAGNOSIS — Z111 Encounter for screening for respiratory tuberculosis: Secondary | ICD-10-CM | POA: Diagnosis not present

## 2018-11-16 DIAGNOSIS — E785 Hyperlipidemia, unspecified: Secondary | ICD-10-CM | POA: Diagnosis not present

## 2018-11-16 DIAGNOSIS — I469 Cardiac arrest, cause unspecified: Secondary | ICD-10-CM | POA: Diagnosis not present

## 2018-11-16 DIAGNOSIS — G8114 Spastic hemiplegia affecting left nondominant side: Secondary | ICD-10-CM | POA: Diagnosis not present

## 2018-11-16 DIAGNOSIS — E559 Vitamin D deficiency, unspecified: Secondary | ICD-10-CM | POA: Diagnosis not present

## 2018-11-17 ENCOUNTER — Telehealth: Payer: Self-pay

## 2018-11-17 NOTE — Telephone Encounter (Signed)
Ptn Niece demetrius [phone] very concerned about decline in patients condition since dc from hospital.  She asked SNF for copy of meds and compared to DC and Norco 4x day added.  Ptn is hallucinating doesn't appear to be doing rehab.  I discussed with AK - he asked to bring ptn in earlier with ET, NP  Will pass RX changes to ET.  Ptn niece will have new ptn package faxed next Tuesday and return and arrange appt transport for patient.

## 2018-11-24 ENCOUNTER — Encounter: Payer: Self-pay | Admitting: Registered Nurse

## 2018-11-24 ENCOUNTER — Encounter
Payer: No Typology Code available for payment source | Attending: Physical Medicine & Rehabilitation | Admitting: Registered Nurse

## 2018-11-24 ENCOUNTER — Other Ambulatory Visit: Payer: Self-pay

## 2018-11-24 VITALS — BP 129/73 | HR 63 | Temp 98.7°F

## 2018-11-24 DIAGNOSIS — G8110 Spastic hemiplegia affecting unspecified side: Secondary | ICD-10-CM

## 2018-11-24 DIAGNOSIS — F4323 Adjustment disorder with mixed anxiety and depressed mood: Secondary | ICD-10-CM | POA: Diagnosis not present

## 2018-11-24 DIAGNOSIS — E7849 Other hyperlipidemia: Secondary | ICD-10-CM

## 2018-11-24 DIAGNOSIS — I4891 Unspecified atrial fibrillation: Secondary | ICD-10-CM | POA: Diagnosis not present

## 2018-11-24 DIAGNOSIS — I639 Cerebral infarction, unspecified: Secondary | ICD-10-CM

## 2018-11-24 NOTE — Progress Notes (Signed)
Subjective:    Patient ID: Rachel Fox, female    DOB: 1945/11/02, 73 y.o.   MRN: [id number]  HPI: Rachel Fox is a 73 y.o. female who is here for hospital follow up visit of her right middle cerebral artery stroke, , spastic hemiparesis, atrial fibrillation and hyperlipidemia.  Rachel Fox was found down in her home for an extended time and was brought to [facility] ED. Found to have left-sided weakness. Neurology Consulted.   EKG: Atrial Fibrillation with RVR and Cardiology Services Consulted. Rachel Fox maintained on Eliquis for CVA prophylaxis and Atrial Fibrillation.   CT Head WO Contrast: CT Cervical Spine WO Contrast IMPRESSION: 1. Moderately large subacute right MCA infarct with 3 mm of leftward midline shift. No hemorrhage. 2. No acute cervical spine fracture.   US Carotid Bilateral:  IMPRESSION: 1. Minimal amount of left-sided atherosclerotic plaque, not resulting in a hemodynamically significant stenosis. 2. Unremarkable sonographic evaluation of right carotid system. 3. Incidentally noted cardiac arrhythmia. Further evaluation with ECG monitoring could be performed as indicated  DG: Left:  IMPRESSION: 1. No acute osseous abnormality. 2. Mild degenerative changes.  Chondrocalcinosis.  She was admitted to inpatient Rehabilitation on 09/27/2018 and discharged to SNF [facility] on 11/01/2018. She states she has pain in her left arm she rates her pain 8. Also reports she has a good appetite. Rachel Fox arrived in wheelchair, she was assisted to the bathroom, she needed 4 staff members to transfer her to the toilet. Rachel Fox states she is receiving therapy in the nursing home and states she's able to transfer herself with one assist. Rachel Fox has left sided hemiparesis.   Spoke with Rachel Fox son Rachel Fox in detail over a hour regarding his questions concerning Rachel Fox  medications and her going home. He was educated in detail regarding her safety and he would  have to  provide 24 hour care for her safety he verbalizes understanding. He stated he will be hiring a Multimedia programmer to assist in her care and his cousin will be assisting him.  In the evening spoke with Rachel Fox  niece Rachel Fox in detail regarding the above for 45 minutes, all questions were answered. Also re-iterated Ms. Kaney would need 24 hour care, she verbalizes understanding.    Pain Inventory Average Pain 6 Pain Right Now 8 My pain is intermittent, sharp and tingling  In the last 24 hours, has pain interfered with the following? General activity n/a Relation with others n/a Enjoyment of life n/a What TIME of day is your pain at its worst? all day Sleep (in general) Poor  Pain is worse with: unsure Pain improves with: n/a Relief from Meds: n/a  Mobility walk with assistance do you drive?  no use a wheelchair needs help with transfers  Function retired  Neuro/Psych bladder control problems  Prior Studies Any changes since last visit?  no  Physicians involved in your care Any changes since last visit?  no   Family History  Problem Relation Age of Onset  . Stroke Mother   . Heart attack Father   . Stroke Sister   . Heart attack Sister    Social History   Socioeconomic History  . Marital status: Divorced    Spouse name: Not on file  . Number of children: Not on file  . Years of education: Not on file  . Highest education level: Not on file  Occupational History  . Not on file  Social Needs  .  Financial resource strain: Not on file  . Food insecurity    Worry: Not on file    Inability: Not on file  . Transportation needs    Medical: Not on file    Non-medical: Not on file  Tobacco Use  . Smoking status: Former Games developermoker  . Smokeless tobacco: Never Used  Substance and Sexual Activity  . Alcohol use: Never    Frequency: Never  . Drug use: Never  . Sexual activity: Not on file  Lifestyle  . Physical activity    Days per week: Not on file     Minutes per session: Not on file  . Stress: Not on file  Relationships  . Social Musicianconnections    Talks on phone: Not on file    Gets together: Not on file    Attends religious service: Not on file    Active member of club or organization: Not on file    Attends meetings of clubs or organizations: Not on file    Relationship status: Not on file  Other Topics Concern  . Not on file  Social History Narrative  . Not on file   Past Surgical History:  Procedure Laterality Date  . ABDOMINAL HYSTERECTOMY    . TUBAL LIGATION     Past Medical History:  Diagnosis Date  . High cholesterol   . Hypertension   . Type 2 diabetes mellitus (HCC)    BP 129/73   Pulse 63   Temp 98.7 F (37.1 C)   SpO2 96%   Opioid Risk Score:   Fall Risk Score:  `1  Depression screen PHQ 2/9  No flowsheet data found.  Review of Systems  Constitutional: Negative.   HENT: Negative.   Eyes: Negative.   Respiratory: Negative.   Cardiovascular: Negative.   Gastrointestinal: Negative.   Endocrine: Negative.   Genitourinary: Negative.   Musculoskeletal: Positive for gait problem.  Skin: Negative.   Allergic/Immunologic: Negative.   Neurological: Positive for weakness.  Hematological: Negative.   Psychiatric/Behavioral: Negative.   All other systems reviewed and are negative.      Objective:   Physical Exam Vitals signs and nursing note reviewed.  Constitutional:      Appearance: Normal appearance.  Neck:     Musculoskeletal: Normal range of motion and neck supple.  Cardiovascular:     Rate and Rhythm: Normal rate and regular rhythm.     Pulses: Normal pulses.     Heart sounds: Normal heart sounds.  Pulmonary:     Effort: Pulmonary effort is normal.     Breath sounds: Normal breath sounds.  Musculoskeletal:     Comments: Normal Muscle Bulk and Muscle Testing Reveals:  Upper Extremities: Right: Full ROM and Muscle Strength 5/5 Left: Decreased ROM and Muscle Strength 0/5 Left ARM with  Sling  Lower Extremities: Right: Full ROM and Muscle Strength 5/5 Left: Hemiparesis Arrived in wheelchair.    Skin:    General: Skin is warm and dry.  Neurological:     Mental Status: She is alert and oriented to person, place, and time.  Psychiatric:        Mood and Affect: Mood normal.        Behavior: Behavior normal.           Assessment & Plan:  1. Right Middle Cerebral Artery Stroke/ Left hemiparesis: Continue Therapy at the Nursing Home: Has a scheduled appointment with Neurologist.  2. Atrial Fibrillation: Continue Current medication Regimen. Instructed to schedule appointment with PCP. 3.  Hyperlipidemia: Continue current medication regimen. Instructed to schedule appointment with PCP.  60 minutes of face to face patient care time was spent during this visit. All questions were encouraged and answered.

## 2018-11-25 ENCOUNTER — Telehealth: Payer: Self-pay | Admitting: Registered Nurse

## 2018-11-25 ENCOUNTER — Telehealth: Payer: Self-pay

## 2018-11-25 NOTE — Telephone Encounter (Signed)
Return Rachel Fox call regarding home health aide, left message asking Ms. Straw to speak with SW at the Nursing Home. Awaiting her return call.

## 2018-11-25 NOTE — Telephone Encounter (Signed)
Return Dr. Mora Appl call no answer, left message to return the call.

## 2018-11-25 NOTE — Telephone Encounter (Signed)
Also Dr. Mora Appl called from [facility] returning your call. [phone]

## 2018-11-25 NOTE — Telephone Encounter (Signed)
DeeDee niece of patient called stating she is needing an order for [facility] nurse aide in order for insurance to pay for it.

## 2018-11-30 DIAGNOSIS — E119 Type 2 diabetes mellitus without complications: Secondary | ICD-10-CM | POA: Diagnosis not present

## 2018-11-30 DIAGNOSIS — G8114 Spastic hemiplegia affecting left nondominant side: Secondary | ICD-10-CM | POA: Diagnosis not present

## 2018-11-30 DIAGNOSIS — I69354 Hemiplegia and hemiparesis following cerebral infarction affecting left non-dominant side: Secondary | ICD-10-CM | POA: Diagnosis not present

## 2018-11-30 DIAGNOSIS — I69391 Dysphagia following cerebral infarction: Secondary | ICD-10-CM | POA: Diagnosis not present

## 2018-11-30 DIAGNOSIS — L89323 Pressure ulcer of left buttock, stage 3: Secondary | ICD-10-CM | POA: Diagnosis not present

## 2018-11-30 DIAGNOSIS — I4891 Unspecified atrial fibrillation: Secondary | ICD-10-CM | POA: Diagnosis not present

## 2018-11-30 DIAGNOSIS — I69398 Other sequelae of cerebral infarction: Secondary | ICD-10-CM | POA: Diagnosis not present

## 2018-11-30 DIAGNOSIS — I69322 Dysarthria following cerebral infarction: Secondary | ICD-10-CM | POA: Diagnosis not present

## 2018-11-30 DIAGNOSIS — L89329 Pressure ulcer of left buttock, unspecified stage: Secondary | ICD-10-CM | POA: Diagnosis not present

## 2018-11-30 DIAGNOSIS — I10 Essential (primary) hypertension: Secondary | ICD-10-CM | POA: Diagnosis not present

## 2018-11-30 DIAGNOSIS — Z794 Long term (current) use of insulin: Secondary | ICD-10-CM | POA: Diagnosis not present

## 2018-12-01 ENCOUNTER — Encounter: Payer: Self-pay | Admitting: Student

## 2018-12-01 ENCOUNTER — Ambulatory Visit (INDEPENDENT_AMBULATORY_CARE_PROVIDER_SITE_OTHER): Payer: Medicare Other | Admitting: Student

## 2018-12-01 ENCOUNTER — Other Ambulatory Visit: Payer: Self-pay

## 2018-12-01 VITALS — BP 132/74 | HR 75 | Temp 97.8°F | Ht 67.0 in

## 2018-12-01 DIAGNOSIS — I639 Cerebral infarction, unspecified: Secondary | ICD-10-CM | POA: Diagnosis not present

## 2018-12-01 DIAGNOSIS — Z7901 Long term (current) use of anticoagulants: Secondary | ICD-10-CM | POA: Diagnosis not present

## 2018-12-01 DIAGNOSIS — I10 Essential (primary) hypertension: Secondary | ICD-10-CM

## 2018-12-01 DIAGNOSIS — E785 Hyperlipidemia, unspecified: Secondary | ICD-10-CM | POA: Diagnosis not present

## 2018-12-01 DIAGNOSIS — I4819 Other persistent atrial fibrillation: Secondary | ICD-10-CM

## 2018-12-01 MED ORDER — DILTIAZEM HCL 90 MG PO TABS: 90.0000 mg | ORAL_TABLET | Freq: Three times a day (TID) | ORAL | 11 refills | Status: DC

## 2018-12-01 MED ORDER — AMIODARONE HCL 200 MG PO TABS: 200.0000 mg | ORAL_TABLET | Freq: Every day | ORAL | 3 refills | Status: DC

## 2018-12-01 MED ORDER — APIXABAN 5 MG PO TABS: 5.0000 mg | ORAL_TABLET | Freq: Two times a day (BID) | ORAL | 11 refills | Status: DC

## 2018-12-01 NOTE — Progress Notes (Signed)
Cardiology Office Note    Date:  12/01/2018   ID:  Rachel, Fox 03/07/1946, MRN [id number]  PCP:  Benita Stabile, MD  Cardiologist: Dina Rich, MD    Chief Complaint  Patient presents with   Hospitalization Follow-up    History of Present Illness:    Rachel Fox is a 73 y.o. female with past medical history of HTN, HLD, and Type II DM who presents to the office today for hospital follow-up.  She presented to [facility] ED on 09/20/2018 for evaluation of left-sided weakness and facial droop. CT Head confirmed a large subacute right MCA infarct with 3 mm of leftward midline shift. Neurology was consulted as she was found to be in atrial fibrillation with RVR upon admission which was a new diagnosis for the patient. She was initially started on IV Cardizem for rate control. This caused soft BP and due to wanting to allow for permissive hypertension in the setting of her CVA, she was started on Digoxin.  Rates still remained elevated and Amiodarone was initiated at 200 mg twice daily. Was discharged to Inpatient Rehab on Amiodarone 200mg  BID, Digoxin 0.125mg  daily (was recommended to check Dig Level at rehab but was never obtained by review of records) and short-acting Cardizem 90mg  QID (due to needing to crush pills). Was also started on Eliquis 5mg  BID for anticoagulation once cleared from Neurology. Was at Inpatient Rehab from 7/13 -  11/01/2018 and was continued on Amiodarone, Digoxin, Cardizem and Eliquis at that time with discharge to [facility].   In talking with the patient today she reports still being at [facility] but is planning to discharge home on Monday. Her niece from [place] is coming to help care for her for the next several weeks. She reports overall progressing well at rehab but was very independent prior to her stroke and becomes tearful when talking about her current funtional status. She denies any recent chest pain or palpitations. She reports that she had  actually been experiencing intermittent palpitations for several months prior to admission but they would spontaneously resolve. She denies any recent dyspnea on exertion, orthopnea, PND, or lower extremity edema.  She denies any recent melena, hematochezia, or hematuria. CBC on 8/13 showed stable Hgb at 14.6.   Past Medical History:  Diagnosis Date   Atrial fibrillation (HCC)    CVA (cerebral vascular accident) (HCC)    High cholesterol    Hypertension    Type 2 diabetes mellitus (HCC)     Past Surgical History:  Procedure Laterality Date   ABDOMINAL HYSTERECTOMY     TUBAL LIGATION      Current Medications: Outpatient Medications Prior to Visit  Medication Sig Dispense Refill   acetaminophen (TYLENOL) 325 MG tablet Take 2 tablets (650 mg total) by mouth every 4 (four) hours as needed for mild pain (or temp > 37.5 C (99.5 F)).     atorvastatin (LIPITOR) 80 MG tablet Take 1 tablet (80 mg total) by mouth daily at 6 PM. 30 tablet 1   DULoxetine (CYMBALTA) 30 MG capsule Take 30 mg by mouth daily.     HYDROcodone-acetaminophen (NORCO/VICODIN) 5-325 MG tablet Take 1 tablet by mouth every 6 (six) hours as needed for moderate pain.     mirabegron ER (MYRBETRIQ) 25 MG TB24 tablet Take 1 tablet (25 mg total) by mouth daily. 30 tablet    polyethylene glycol (MIRALAX / GLYCOLAX) 17 g packet Take 17 g by mouth daily.  tizanidine (ZANAFLEX) 2 MG capsule Take 2 mg by mouth 3 (three) times daily.     topiramate (TOPAMAX) 25 MG tablet Take 1 tablet (25 mg total) by mouth 2 (two) times daily. 6 tablet 0   traMADol (ULTRAM) 50 MG tablet Take 1 tablet (50 mg total) by mouth every 12 (twelve) hours as needed for severe pain. 6 tablet 0   amiodarone (PACERONE) 200 MG tablet Take 1 tablet (200 mg total) by mouth 2 (two) times daily. 120 tablet 0   apixaban (ELIQUIS) 5 MG TABS tablet Take 1 tablet (5 mg total) by mouth 2 (two) times daily. 60 tablet 1   digoxin (LANOXIN) 0.125 MG  tablet Take 1 tablet (0.125 mg total) by mouth daily. 30 tablet 1   diltiazem (CARDIZEM) 90 MG tablet Take 1 tablet (90 mg total) by mouth 4 (four) times daily. 120 tablet 0   Cholecalciferol (VITAMIN D3) 50 MCG (2000 UT) TABS Take by mouth.     diclofenac sodium (VOLTAREN) 1 % GEL Apply 2 g topically 4 (four) times daily. (Patient not taking: Reported on 11/24/2018)     No facility-administered medications prior to visit.      Allergies:   Lactose intolerance (gi)   Social History   Socioeconomic History   Marital status: Divorced    Spouse name: Not on file   Number of children: Not on file   Years of education: Not on file   Highest education level: Not on file  Occupational History   Not on file  Social Needs   Financial resource strain: Not on file   Food insecurity    Worry: Not on file    Inability: Not on file   Transportation needs    Medical: Not on file    Non-medical: Not on file  Tobacco Use   Smoking status: Former Smoker   Smokeless tobacco: Never Used  Substance and Sexual Activity   Alcohol use: Never    Frequency: Never   Drug use: Never   Sexual activity: Not on file  Lifestyle   Physical activity    Days per week: Not on file    Minutes per session: Not on file   Stress: Not on file  Relationships   Social connections    Talks on phone: Not on file    Gets together: Not on file    Attends religious service: Not on file    Active member of club or organization: Not on file    Attends meetings of clubs or organizations: Not on file    Relationship status: Not on file  Other Topics Concern   Not on file  Social History Narrative   Not on file     Family History:  The patient's family history includes Heart attack in her father and sister; Stroke in her mother and sister.   Review of Systems:   Please see the history of present illness.     General:  No chills, fever, night sweats or weight changes.  Cardiovascular:  No  chest pain, dyspnea on exertion, edema, orthopnea, palpitations, paroxysmal nocturnal dyspnea. Dermatological: No rash, lesions/masses Respiratory: No cough, dyspnea Urologic: No hematuria, dysuria Abdominal:   No nausea, vomiting, diarrhea, bright red blood per rectum, melena, or hematemesis Neurologic:  No visual changes, changes in mental status. Positive for weakness.   All other systems reviewed and are otherwise negative except as noted above.   Physical Exam:    VS:  BP 132/74  Pulse 75    Temp 97.8 F (36.6 C)    Ht 5\' 7"  (1.702 m)    SpO2 94%    BMI 24.90 kg/m    General: Well developed, well nourished,female appearing in no acute distress. Head: Normocephalic, atraumatic, sclera non-icteric, no xanthomas, nares are without discharge.  Neck: No carotid bruits. JVD not elevated.  Lungs: Respirations regular and unlabored, without wheezes or rales.  Heart: Regular rate and rhythm. No S3 or S4.  No murmur, no rubs, or gallops appreciated. Abdomen: Soft, non-tender, non-distended with normoactive bowel sounds. No hepatomegaly. No rebound/guarding. No obvious abdominal masses. Msk:  Strength and tone appear normal for age. No joint deformities or effusions. Extremities: No clubbing or cyanosis. Trace ankle edema bilaterally.  Distal pedal pulses are 2+ bilaterally. Neuro: Alert and oriented X 3.  Left arm in sling.  Psych:  Responds to questions appropriately with a normal affect. Skin: No rashes or lesions noted  Wt Readings from Last 3 Encounters:  10/20/18 158 lb 15.2 oz (72.1 kg)  09/20/18 160 lb 7.9 oz (72.8 kg)     Studies/Labs Reviewed:   EKG:  EKG is ordered today.  The ekg ordered today demonstrates sinus bradycardia, HR 54 with flattened T-waves.   Recent Labs: 09/20/2018: B Natriuretic Peptide 485.0 09/21/2018: TSH 1.655 09/26/2018: Magnesium 2.0 09/28/2018: ALT 39 10/28/2018: BUN 16; Creatinine, Ser 1.10; Hemoglobin 14.6; Platelets 200; Potassium 4.1; Sodium 142    Lipid Panel    Component Value Date/Time   CHOL 184 09/21/2018 1120   TRIG 107 09/21/2018 1120   HDL 42 09/21/2018 1120   CHOLHDL 4.4 09/21/2018 1120   VLDL 21 09/21/2018 1120   LDLCALC 121 (H) 09/21/2018 1120    Additional studies/ records that were reviewed today include:   Carotid Dopplers: 09/20/2018 IMPRESSION: 1. Minimal amount of left-sided atherosclerotic plaque, not resulting in a hemodynamically significant stenosis. 2. Unremarkable sonographic evaluation of right carotid system. 3. Incidentally noted cardiac arrhythmia. Further evaluation with ECG monitoring could be performed as indicated   Echocardiogram: 09/21/2018 IMPRESSIONS   1. The left ventricle has hyperdynamic systolic function, with an ejection fraction of >65%. The cavity size was normal. There is mildly increased left ventricular wall thickness. Left ventricular diastolic function could not be evaluated secondary to  atrial fibrillation.  2. The right ventricle has normal systolic function. The cavity was normal. There is no increase in right ventricular wall thickness.  3. Trivial pericardial effusion is present.  4. No evidence of mitral valve stenosis.  5. No stenosis of the aortic valve.  6. The interatrial septum was not assessed.  Assessment:    1. Persistent atrial fibrillation   2. Current use of long term anticoagulation   3. Essential hypertension   4. Hyperlipidemia LDL goal <70      Plan:   In order of problems listed above:  1. Persistent Atrial Fibrillation/ Use of Long-Term Anticoagulation - She reports having intermittent episodes of palpitations prior to her CVA but had not informed her PCP or anyone of this. Since hospital discharge she has converted from atrial fibrillation back to normal sinus rhythm by examination which was confirmed with an EKG today. - She is currently on Amiodarone 200mg  BID, Digoxin 0.125mg  daily and short-acting Cardizem 90mg  QID (due to needing  to crush pills). Will stop Digoxin as as it was initially recommended to discontinue this while at CIR. Will also reduce Amiodarone to 200 mg once daily as she has been on this for over  2 months. She is unable to be transitioned to Cardizem CD due to needing to crush her pills. Will reduce dosing from QID to TID due to her bradycardia and to help with compliance of dosing. Will arrange for follow-up within the next 6-8 weeks as she may require further dose reduction of her Cardizem pending HR response to the above changes.  - she denies any evidence of active bleeding. Continue Eliquis 5mg  BID for anticoagulation.   2. HTN - BP is well controlled at 132/74 during today's visit. She is currently on Cardizem 90 mg four times daily.  Will plan to reduce to three times daily dosing for improved compliance once returning home.  3. HLD - FLP during recent admission showed total cholesterol 184, triglycerides 107, HDL 42 and LDL 121. She was started on Atorvastatin 80 mg daily. She is due for repeat labs and reports having scheduled blood work and follow-up with her PCP next week.   The patient asked that I call her niece after her visit today to update her on her current status and medication changes.  I spoke with DeeDee Straws at the patient's request who was appreciative of the information.    Medication Adjustments/Labs and Tests Ordered: Current medicines are reviewed at length with the patient today.  Concerns regarding medicines are outlined above.  Medication changes, Labs and Tests ordered today are listed in the Patient Instructions below. Patient Instructions  Medication Instructions:  Your physician has recommended you make the following change in your medication:  Stop Taking Digoxin  Decrease Amiodarone to 200 mg Daily  Decrease Cardizem to 90 mg Three Times Daily   If you need a refill on your cardiac medications before your next appointment, please call your pharmacy.   Lab  work: NONE   If you have labs (blood work) drawn today and your tests are completely normal, you will receive your results only by:  MyChart Message (if you have MyChart) OR  A paper copy in the mail If you have any lab test that is abnormal or we need to change your treatment, we will call you to review the results.  Testing/Procedures: NONE   Follow-Up: At [facility], you and your health needs are our priority.  As part of our continuing mission to provide you with exceptional heart care, we have created designated Provider Care Teams.  These Care Teams include your primary Cardiologist (physician) and Advanced Practice Providers (APPs -  Physician Assistants and Nurse Practitioners) who all work together to provide you with the care you need, when you need it. You will need a follow up appointment in 6-8 weeks.  Please call our office 2 months in advance to schedule this appointment.  You may see Dina RichBranch, Jonathan, MD or one of the following Advanced Practice Providers on your designated Care Team:   Randall AnBrittany Milanya Sunderland, PA-C ([place] Office)  Jacolyn ReedyMichele Lenze, PA-C [facility]([place] Office)  Any Other Special Instructions Will Be Listed Below (If Applicable). Thank you for choosing [facility]!    Signed, Ellsworth LennoxBrittany M Dennie Moltz, PA-C  12/01/2018 5:39 PM    [facility] [address] [place], [place] [address] Phone: [phone]) [phone] Fax: [phone]) [phone]

## 2018-12-01 NOTE — Patient Instructions (Addendum)
Medication Instructions:  Your physician has recommended you make the following change in your medication:  Stop Taking Digoxin  Decrease Amiodarone to 200 mg Daily  Decrease Cardizem to 90 mg Three Times Daily   If you need a refill on your cardiac medications before your next appointment, please call your pharmacy.   Lab work: NONE   If you have labs (blood work) drawn today and your tests are completely normal, you will receive your results only by: Marland Kitchen MyChart Message (if you have MyChart) OR . A paper copy in the mail If you have any lab test that is abnormal or we need to change your treatment, we will call you to review the results.  Testing/Procedures: NONE   Follow-Up: At [facility] you and your health needs are our priority.  As part of our continuing mission to provide you with exceptional heart care, we have created designated Provider Care Teams.  These Care Teams include your primary Cardiologist (physician) and Advanced Practice Providers (APPs -  Physician Assistants and Nurse Practitioners) who all work together to provide you with the care you need, when you need it. You will need a follow up appointment in 6-8 weeks.  Please call our office 2 months in advance to schedule this appointment.  You may see Carlyle Dolly, MD or one of the following Advanced Practice Providers on your designated Care Team:   Bernerd Pho, PA-C [facility]) . Ermalinda Barrios, PA-C ([place])  Any Other Special Instructions Will Be Listed Below (If Applicable). Thank you for choosing [facility]!

## 2018-12-03 ENCOUNTER — Inpatient Hospital Stay: Payer: Medicare Other | Admitting: Physical Medicine & Rehabilitation

## 2018-12-06 DIAGNOSIS — E559 Vitamin D deficiency, unspecified: Secondary | ICD-10-CM | POA: Diagnosis not present

## 2018-12-06 DIAGNOSIS — K5900 Constipation, unspecified: Secondary | ICD-10-CM | POA: Diagnosis not present

## 2018-12-06 DIAGNOSIS — I69322 Dysarthria following cerebral infarction: Secondary | ICD-10-CM | POA: Diagnosis not present

## 2018-12-06 DIAGNOSIS — I4891 Unspecified atrial fibrillation: Secondary | ICD-10-CM | POA: Diagnosis not present

## 2018-12-06 DIAGNOSIS — G8114 Spastic hemiplegia affecting left nondominant side: Secondary | ICD-10-CM | POA: Diagnosis not present

## 2018-12-06 DIAGNOSIS — I69398 Other sequelae of cerebral infarction: Secondary | ICD-10-CM | POA: Diagnosis not present

## 2018-12-06 DIAGNOSIS — F4323 Adjustment disorder with mixed anxiety and depressed mood: Secondary | ICD-10-CM | POA: Diagnosis not present

## 2018-12-06 DIAGNOSIS — R41841 Cognitive communication deficit: Secondary | ICD-10-CM | POA: Diagnosis not present

## 2018-12-06 DIAGNOSIS — R52 Pain, unspecified: Secondary | ICD-10-CM | POA: Diagnosis not present

## 2018-12-06 DIAGNOSIS — I69391 Dysphagia following cerebral infarction: Secondary | ICD-10-CM | POA: Diagnosis not present

## 2018-12-06 DIAGNOSIS — N3281 Overactive bladder: Secondary | ICD-10-CM | POA: Diagnosis not present

## 2018-12-06 DIAGNOSIS — R1310 Dysphagia, unspecified: Secondary | ICD-10-CM | POA: Diagnosis not present

## 2018-12-07 DIAGNOSIS — L89143 Pressure ulcer of left lower back, stage 3: Secondary | ICD-10-CM | POA: Diagnosis not present

## 2018-12-07 DIAGNOSIS — R1312 Dysphagia, oropharyngeal phase: Secondary | ICD-10-CM | POA: Diagnosis not present

## 2018-12-07 DIAGNOSIS — I69354 Hemiplegia and hemiparesis following cerebral infarction affecting left non-dominant side: Secondary | ICD-10-CM | POA: Diagnosis not present

## 2018-12-07 DIAGNOSIS — R197 Diarrhea, unspecified: Secondary | ICD-10-CM | POA: Diagnosis not present

## 2018-12-07 DIAGNOSIS — M6282 Rhabdomyolysis: Secondary | ICD-10-CM | POA: Diagnosis not present

## 2018-12-07 DIAGNOSIS — N179 Acute kidney failure, unspecified: Secondary | ICD-10-CM | POA: Diagnosis not present

## 2018-12-07 DIAGNOSIS — E785 Hyperlipidemia, unspecified: Secondary | ICD-10-CM | POA: Diagnosis not present

## 2018-12-07 DIAGNOSIS — Z79891 Long term (current) use of opiate analgesic: Secondary | ICD-10-CM | POA: Diagnosis not present

## 2018-12-07 DIAGNOSIS — K5900 Constipation, unspecified: Secondary | ICD-10-CM | POA: Diagnosis not present

## 2018-12-07 DIAGNOSIS — N39498 Other specified urinary incontinence: Secondary | ICD-10-CM | POA: Diagnosis not present

## 2018-12-07 DIAGNOSIS — I69391 Dysphagia following cerebral infarction: Secondary | ICD-10-CM | POA: Diagnosis not present

## 2018-12-07 DIAGNOSIS — N318 Other neuromuscular dysfunction of bladder: Secondary | ICD-10-CM | POA: Diagnosis not present

## 2018-12-07 DIAGNOSIS — F419 Anxiety disorder, unspecified: Secondary | ICD-10-CM | POA: Diagnosis not present

## 2018-12-07 DIAGNOSIS — G8114 Spastic hemiplegia affecting left nondominant side: Secondary | ICD-10-CM | POA: Diagnosis not present

## 2018-12-07 DIAGNOSIS — I480 Paroxysmal atrial fibrillation: Secondary | ICD-10-CM | POA: Diagnosis not present

## 2018-12-07 DIAGNOSIS — R443 Hallucinations, unspecified: Secondary | ICD-10-CM | POA: Diagnosis not present

## 2018-12-07 DIAGNOSIS — I63311 Cerebral infarction due to thrombosis of right middle cerebral artery: Secondary | ICD-10-CM | POA: Diagnosis not present

## 2018-12-07 DIAGNOSIS — F3289 Other specified depressive episodes: Secondary | ICD-10-CM | POA: Diagnosis not present

## 2018-12-07 DIAGNOSIS — Z87891 Personal history of nicotine dependence: Secondary | ICD-10-CM | POA: Diagnosis not present

## 2018-12-07 DIAGNOSIS — M6281 Muscle weakness (generalized): Secondary | ICD-10-CM | POA: Diagnosis not present

## 2018-12-07 DIAGNOSIS — I69312 Visuospatial deficit and spatial neglect following cerebral infarction: Secondary | ICD-10-CM | POA: Diagnosis not present

## 2018-12-07 DIAGNOSIS — E782 Mixed hyperlipidemia: Secondary | ICD-10-CM | POA: Diagnosis not present

## 2018-12-07 DIAGNOSIS — I4891 Unspecified atrial fibrillation: Secondary | ICD-10-CM | POA: Diagnosis not present

## 2018-12-07 DIAGNOSIS — Z7901 Long term (current) use of anticoagulants: Secondary | ICD-10-CM | POA: Diagnosis not present

## 2018-12-07 DIAGNOSIS — I10 Essential (primary) hypertension: Secondary | ICD-10-CM | POA: Diagnosis not present

## 2018-12-07 DIAGNOSIS — M792 Neuralgia and neuritis, unspecified: Secondary | ICD-10-CM | POA: Diagnosis not present

## 2018-12-07 DIAGNOSIS — E1165 Type 2 diabetes mellitus with hyperglycemia: Secondary | ICD-10-CM | POA: Diagnosis not present

## 2018-12-07 DIAGNOSIS — Z9181 History of falling: Secondary | ICD-10-CM | POA: Diagnosis not present

## 2018-12-07 DIAGNOSIS — N3281 Overactive bladder: Secondary | ICD-10-CM | POA: Diagnosis not present

## 2018-12-07 DIAGNOSIS — G8929 Other chronic pain: Secondary | ICD-10-CM | POA: Diagnosis not present

## 2018-12-07 DIAGNOSIS — E119 Type 2 diabetes mellitus without complications: Secondary | ICD-10-CM | POA: Diagnosis not present

## 2018-12-07 DIAGNOSIS — L89159 Pressure ulcer of sacral region, unspecified stage: Secondary | ICD-10-CM | POA: Diagnosis not present

## 2018-12-08 ENCOUNTER — Inpatient Hospital Stay: Payer: Self-pay | Admitting: Adult Health

## 2018-12-08 DIAGNOSIS — R1312 Dysphagia, oropharyngeal phase: Secondary | ICD-10-CM | POA: Diagnosis not present

## 2018-12-08 DIAGNOSIS — L89143 Pressure ulcer of left lower back, stage 3: Secondary | ICD-10-CM | POA: Diagnosis not present

## 2018-12-08 DIAGNOSIS — I69312 Visuospatial deficit and spatial neglect following cerebral infarction: Secondary | ICD-10-CM | POA: Diagnosis not present

## 2018-12-08 DIAGNOSIS — I69391 Dysphagia following cerebral infarction: Secondary | ICD-10-CM | POA: Diagnosis not present

## 2018-12-08 DIAGNOSIS — I4891 Unspecified atrial fibrillation: Secondary | ICD-10-CM | POA: Diagnosis not present

## 2018-12-08 DIAGNOSIS — I69354 Hemiplegia and hemiparesis following cerebral infarction affecting left non-dominant side: Secondary | ICD-10-CM | POA: Diagnosis not present

## 2018-12-08 NOTE — Progress Notes (Deleted)
[facility] [address] [place]. [place] [address] [phone]       HOSPITAL FOLLOW UP NOTE  [name] Date of Birth:  [birth year] Medical Record Number:  [id number]   Reason for Referral:  hospital stroke follow up    CHIEF COMPLAINT:  No chief complaint on file.   HPI: Rachel Fox being seen today for in office hospital follow-up regarding large right MCA infarct secondary to new onset A. fib on 09/20/2018.  History obtained from *** and chart review. Reviewed all radiology images and labs personally.  [name] is a 73 y.o. female with history of HTN, HLD, type II DVT presented to [facility] ED on 09/20/2018 after being found down on her floor x2 days.  Stroke work-up revealed large right MCA infarct embolic pattern as evidenced on CT head secondary to new onset A. fib.  CT head also showed evidence of 3 mm left shift.  TCD showed diffuse intracranial atherosclerosis.  Carotid Doppler no significant stenosis.  2D echo normal EF and atrial fibrillation.  LDL 121.  A1c 6.1.  Recommended initiating anticoagulation due to new onset A. fib.  HTN stable and recommended long-term BP goal normotensive range.  Recommend initiating atorvastatin 80 mg daily once LFTs stabilize for HLD management.  Controlled DM with satisfactory A1c.  Other stroke risk factors include advanced age, former tobacco use and family history of stroke but no prior history of stroke.  Other active problems include rhabdomyolysis, hyperbilirubinemia, leukocytosis and hyponatremia.  Residual deficits of dysarthria, right gaze preference, left homonymous hemianopia, left lower facial weakness and left hemiplegia.  She was discharged to [facility] for ongoing therapy on 09/27/2018 and was discharged to SNF on 11/01/2018.     ROS:   14 system review of systems performed and negative with exception of ***  PMH:  Past Medical History:  Diagnosis Date  . Atrial fibrillation (HCC)   . CVA  (cerebral vascular accident) (HCC)   . High cholesterol   . Hypertension   . Type 2 diabetes mellitus (HCC)     PSH:  Past Surgical History:  Procedure Laterality Date  . ABDOMINAL HYSTERECTOMY    . TUBAL LIGATION      Social History:  Social History   Socioeconomic History  . Marital status: Divorced    Spouse name: Not on file  . Number of children: Not on file  . Years of education: Not on file  . Highest education level: Not on file  Occupational History  . Not on file  Social Needs  . Financial resource strain: Not on file  . Food insecurity    Worry: Not on file    Inability: Not on file  . Transportation needs    Medical: Not on file    Non-medical: Not on file  Tobacco Use  . Smoking status: Former Games developer  . Smokeless tobacco: Never Used  Substance and Sexual Activity  . Alcohol use: Never    Frequency: Never  . Drug use: Never  . Sexual activity: Not on file  Lifestyle  . Physical activity    Days per week: Not on file    Minutes per session: Not on file  . Stress: Not on file  Relationships  . Social Musician on phone: Not on file    Gets together: Not on file    Attends religious service: Not on file    Active member of club or organization: Not on  file    Attends meetings of clubs or organizations: Not on file    Relationship status: Not on file  . Intimate partner violence    Fear of current or ex partner: Not on file    Emotionally abused: Not on file    Physically abused: Not on file    Forced sexual activity: Not on file  Other Topics Concern  . Not on file  Social History Narrative  . Not on file    Family History:  Family History  Problem Relation Age of Onset  . Stroke Mother   . Heart attack Father   . Stroke Sister   . Heart attack Sister     Medications:   Current Outpatient Medications on File Prior to Visit  Medication Sig Dispense Refill  . acetaminophen (TYLENOL) 325 MG tablet Take 2 tablets (650 mg  total) by mouth every 4 (four) hours as needed for mild pain (or temp > 37.5 C (99.5 F)).    Marland Kitchen amiodarone (PACERONE) 200 MG tablet Take 1 tablet (200 mg total) by mouth daily. 90 tablet 3  . apixaban (ELIQUIS) 5 MG TABS tablet Take 1 tablet (5 mg total) by mouth 2 (two) times daily. 60 tablet 11  . atorvastatin (LIPITOR) 80 MG tablet Take 1 tablet (80 mg total) by mouth daily at 6 PM. 30 tablet 1  . Cholecalciferol (VITAMIN D3) 50 MCG (2000 UT) TABS Take by mouth.    . diclofenac sodium (VOLTAREN) 1 % GEL Apply 2 g topically 4 (four) times daily. (Patient not taking: Reported on 11/24/2018)    . diltiazem (CARDIZEM) 90 MG tablet Take 1 tablet (90 mg total) by mouth 3 (three) times daily. 90 tablet 11  . DULoxetine (CYMBALTA) 30 MG capsule Take 30 mg by mouth daily.    Marland Kitchen HYDROcodone-acetaminophen (NORCO/VICODIN) 5-325 MG tablet Take 1 tablet by mouth every 6 (six) hours as needed for moderate pain.    . mirabegron ER (MYRBETRIQ) 25 MG TB24 tablet Take 1 tablet (25 mg total) by mouth daily. 30 tablet   . polyethylene glycol (MIRALAX / GLYCOLAX) 17 g packet Take 17 g by mouth daily.    . tizanidine (ZANAFLEX) 2 MG capsule Take 2 mg by mouth 3 (three) times daily.    Marland Kitchen topiramate (TOPAMAX) 25 MG tablet Take 1 tablet (25 mg total) by mouth 2 (two) times daily. 6 tablet 0  . traMADol (ULTRAM) 50 MG tablet Take 1 tablet (50 mg total) by mouth every 12 (twelve) hours as needed for severe pain. 6 tablet 0   No current facility-administered medications on file prior to visit.     Allergies:   Allergies  Allergen Reactions  . Lactose Intolerance (Gi) Other (See Comments)    Causes Gas in patient.     Physical Exam  There were no vitals filed for this visit. There is no height or weight on file to calculate BMI. No exam data present  No flowsheet data found.   General: well developed, well nourished, seated, in no evident distress Head: head normocephalic and atraumatic.   Neck: supple with no  carotid or supraclavicular bruits Cardiovascular: regular rate and rhythm, no murmurs Musculoskeletal: no deformity Skin:  no rash/petichiae Vascular:  Normal pulses all extremities   Neurologic Exam Mental Status: Awake and fully alert. Oriented to place and time. Recent and remote memory intact. Attention span, concentration and fund of knowledge appropriate. Mood and affect appropriate.  Cranial Nerves: Fundoscopic exam reveals sharp disc margins.  Pupils equal, briskly reactive to light. Extraocular movements full without nystagmus. Visual fields full to confrontation. Hearing intact. Facial sensation intact. Face, tongue, palate moves normally and symmetrically.  Motor: Normal bulk and tone. Normal strength in all tested extremity muscles. Sensory.: intact to touch , pinprick , position and vibratory sensation.  Coordination: Rapid alternating movements normal in all extremities. Finger-to-nose and heel-to-shin performed accurately bilaterally. Gait and Station: Arises from chair without difficulty. Stance is normal. Gait demonstrates normal stride length and balance Reflexes: 1+ and symmetric. Toes downgoing.     NIHSS  *** Modified Rankin  *** CHA2DS2-VASc *** HAS-BLED ***   Diagnostic Data (Labs, Imaging, Testing)  CT HEAD WO CONTRAST 09/20/2018 IMPRESSION: 1. Moderately large subacute right MCA infarct with 3 mm of leftward midline shift. No hemorrhage. 2. No acute cervical spine fracture.  ECHOCARDIOGRAM 09/21/2018 IMPRESSIONS  1. The left ventricle has hyperdynamic systolic function, with an ejection fraction of >65%. The cavity size was normal. There is mildly increased left ventricular wall thickness. Left ventricular diastolic function could not be evaluated secondary to  atrial fibrillation.  2. The right ventricle has normal systolic function. The cavity was normal. There is no increase in right ventricular wall thickness.  3. Trivial pericardial effusion is present.   4. No evidence of mitral valve stenosis.  5. No stenosis of the aortic valve.  6. The interatrial septum was not assessed.  VAS [place] TRANSCRANIAL DOPPLER 09/21/2018 Summary: Absent right temporal window limits evaluation of anetrior circulation on right.Normal mean flow velocities in remaining identified vessles of anterior and posterior cerebral circulation.Globally elevated pulsaitity indices suggest diffus eintracranial  atherosclerosis likely.  US CAROTID BILATERAL 09/20/2018 IMPRESSION: 1. Minimal amount of left-sided atherosclerotic plaque, not resulting in a hemodynamically significant stenosis. 2. Unremarkable sonographic evaluation of right carotid system. 3. Incidentally noted cardiac arrhythmia. Further evaluation with ECG monitoring could be performed as indicated    ASSESSMENT: Rachel Fox is a 73 y.o. year old female presented after being found down for 2 days on 09/20/2018 with stroke work-up revealing large right MCA infarct embolic pattern secondary to new onset A. fib. Vascular risk factors include new onset A. fib, HTN, HLD, DM.     PLAN:  1. Right MCA infarct: Continue Eliquis (apixaban) daily  and ***  for secondary stroke prevention. Maintain strict control of hypertension with blood pressure goal below 130/90, diabetes with hemoglobin A1c goal below 6.5% and cholesterol with LDL cholesterol (bad cholesterol) goal below 70 mg/dL.  I also advised the patient to eat a healthy diet with plenty of whole grains, cereals, fruits and vegetables, exercise regularly with at least 30 minutes of continuous activity daily and maintain ideal body weight. 2. New onset atrial fibrillation: 3. HTN: Advised to continue current treatment regimen.  Today's BP ***.  Advised to continue to monitor at home along with continued follow-up with PCP for management 4. HLD: Advised to continue current treatment regimen along with continued follow-up with PCP for future prescribing and  monitoring of lipid panel 5. DMII: Advised to continue to monitor glucose levels at home along with continued follow-up with PCP for management and monitoring    Follow up in *** or call earlier if needed   Greater than 50% of time during this 45 minute visit was spent on counseling, explanation of diagnosis of ***, reviewing risk factor management of ***, planning of further management along with potential future management, and discussion with patient and family answering all questions.    Shanda Bumps  Caryn Section  [facility] [address] [place],  [address]  Phone [phone] Fax [phone] Note: This document was prepared with digital dictation and possible smart phrase technology. Any transcriptional errors that result from this process are unintentional.

## 2018-12-09 DIAGNOSIS — I4891 Unspecified atrial fibrillation: Secondary | ICD-10-CM | POA: Diagnosis not present

## 2018-12-09 DIAGNOSIS — L89143 Pressure ulcer of left lower back, stage 3: Secondary | ICD-10-CM | POA: Diagnosis not present

## 2018-12-09 DIAGNOSIS — I69312 Visuospatial deficit and spatial neglect following cerebral infarction: Secondary | ICD-10-CM | POA: Diagnosis not present

## 2018-12-09 DIAGNOSIS — I69391 Dysphagia following cerebral infarction: Secondary | ICD-10-CM | POA: Diagnosis not present

## 2018-12-09 DIAGNOSIS — R1312 Dysphagia, oropharyngeal phase: Secondary | ICD-10-CM | POA: Diagnosis not present

## 2018-12-09 DIAGNOSIS — I69354 Hemiplegia and hemiparesis following cerebral infarction affecting left non-dominant side: Secondary | ICD-10-CM | POA: Diagnosis not present

## 2018-12-13 ENCOUNTER — Encounter: Payer: Self-pay | Admitting: Adult Health

## 2018-12-13 DIAGNOSIS — R1312 Dysphagia, oropharyngeal phase: Secondary | ICD-10-CM | POA: Diagnosis not present

## 2018-12-13 DIAGNOSIS — L89143 Pressure ulcer of left lower back, stage 3: Secondary | ICD-10-CM | POA: Diagnosis not present

## 2018-12-13 DIAGNOSIS — I69312 Visuospatial deficit and spatial neglect following cerebral infarction: Secondary | ICD-10-CM | POA: Diagnosis not present

## 2018-12-13 DIAGNOSIS — I69354 Hemiplegia and hemiparesis following cerebral infarction affecting left non-dominant side: Secondary | ICD-10-CM | POA: Diagnosis not present

## 2018-12-13 DIAGNOSIS — I4891 Unspecified atrial fibrillation: Secondary | ICD-10-CM | POA: Diagnosis not present

## 2018-12-13 DIAGNOSIS — I69391 Dysphagia following cerebral infarction: Secondary | ICD-10-CM | POA: Diagnosis not present

## 2018-12-14 DIAGNOSIS — I69354 Hemiplegia and hemiparesis following cerebral infarction affecting left non-dominant side: Secondary | ICD-10-CM | POA: Diagnosis not present

## 2018-12-14 DIAGNOSIS — I69312 Visuospatial deficit and spatial neglect following cerebral infarction: Secondary | ICD-10-CM | POA: Diagnosis not present

## 2018-12-14 DIAGNOSIS — I69391 Dysphagia following cerebral infarction: Secondary | ICD-10-CM | POA: Diagnosis not present

## 2018-12-14 DIAGNOSIS — I4891 Unspecified atrial fibrillation: Secondary | ICD-10-CM | POA: Diagnosis not present

## 2018-12-14 DIAGNOSIS — R1312 Dysphagia, oropharyngeal phase: Secondary | ICD-10-CM | POA: Diagnosis not present

## 2018-12-14 DIAGNOSIS — L89143 Pressure ulcer of left lower back, stage 3: Secondary | ICD-10-CM | POA: Diagnosis not present

## 2018-12-15 DIAGNOSIS — I69391 Dysphagia following cerebral infarction: Secondary | ICD-10-CM | POA: Diagnosis not present

## 2018-12-15 DIAGNOSIS — R1312 Dysphagia, oropharyngeal phase: Secondary | ICD-10-CM | POA: Diagnosis not present

## 2018-12-15 DIAGNOSIS — I69312 Visuospatial deficit and spatial neglect following cerebral infarction: Secondary | ICD-10-CM | POA: Diagnosis not present

## 2018-12-15 DIAGNOSIS — I69354 Hemiplegia and hemiparesis following cerebral infarction affecting left non-dominant side: Secondary | ICD-10-CM | POA: Diagnosis not present

## 2018-12-15 DIAGNOSIS — L89143 Pressure ulcer of left lower back, stage 3: Secondary | ICD-10-CM | POA: Diagnosis not present

## 2018-12-15 DIAGNOSIS — I4891 Unspecified atrial fibrillation: Secondary | ICD-10-CM | POA: Diagnosis not present

## 2018-12-16 DIAGNOSIS — R1312 Dysphagia, oropharyngeal phase: Secondary | ICD-10-CM | POA: Diagnosis not present

## 2018-12-16 DIAGNOSIS — I69312 Visuospatial deficit and spatial neglect following cerebral infarction: Secondary | ICD-10-CM | POA: Diagnosis not present

## 2018-12-16 DIAGNOSIS — I69354 Hemiplegia and hemiparesis following cerebral infarction affecting left non-dominant side: Secondary | ICD-10-CM | POA: Diagnosis not present

## 2018-12-16 DIAGNOSIS — I4891 Unspecified atrial fibrillation: Secondary | ICD-10-CM | POA: Diagnosis not present

## 2018-12-16 DIAGNOSIS — I69391 Dysphagia following cerebral infarction: Secondary | ICD-10-CM | POA: Diagnosis not present

## 2018-12-16 DIAGNOSIS — L89143 Pressure ulcer of left lower back, stage 3: Secondary | ICD-10-CM | POA: Diagnosis not present

## 2018-12-17 DIAGNOSIS — L89143 Pressure ulcer of left lower back, stage 3: Secondary | ICD-10-CM | POA: Diagnosis not present

## 2018-12-17 DIAGNOSIS — I69391 Dysphagia following cerebral infarction: Secondary | ICD-10-CM | POA: Diagnosis not present

## 2018-12-17 DIAGNOSIS — R1312 Dysphagia, oropharyngeal phase: Secondary | ICD-10-CM | POA: Diagnosis not present

## 2018-12-17 DIAGNOSIS — I69354 Hemiplegia and hemiparesis following cerebral infarction affecting left non-dominant side: Secondary | ICD-10-CM | POA: Diagnosis not present

## 2018-12-17 DIAGNOSIS — I4891 Unspecified atrial fibrillation: Secondary | ICD-10-CM | POA: Diagnosis not present

## 2018-12-17 DIAGNOSIS — I69312 Visuospatial deficit and spatial neglect following cerebral infarction: Secondary | ICD-10-CM | POA: Diagnosis not present

## 2018-12-20 DIAGNOSIS — R1312 Dysphagia, oropharyngeal phase: Secondary | ICD-10-CM | POA: Diagnosis not present

## 2018-12-20 DIAGNOSIS — I69312 Visuospatial deficit and spatial neglect following cerebral infarction: Secondary | ICD-10-CM | POA: Diagnosis not present

## 2018-12-20 DIAGNOSIS — L89143 Pressure ulcer of left lower back, stage 3: Secondary | ICD-10-CM | POA: Diagnosis not present

## 2018-12-20 DIAGNOSIS — I69391 Dysphagia following cerebral infarction: Secondary | ICD-10-CM | POA: Diagnosis not present

## 2018-12-20 DIAGNOSIS — I69354 Hemiplegia and hemiparesis following cerebral infarction affecting left non-dominant side: Secondary | ICD-10-CM | POA: Diagnosis not present

## 2018-12-20 DIAGNOSIS — I4891 Unspecified atrial fibrillation: Secondary | ICD-10-CM | POA: Diagnosis not present

## 2018-12-21 DIAGNOSIS — L89143 Pressure ulcer of left lower back, stage 3: Secondary | ICD-10-CM | POA: Diagnosis not present

## 2018-12-21 DIAGNOSIS — I69391 Dysphagia following cerebral infarction: Secondary | ICD-10-CM | POA: Diagnosis not present

## 2018-12-21 DIAGNOSIS — I69354 Hemiplegia and hemiparesis following cerebral infarction affecting left non-dominant side: Secondary | ICD-10-CM | POA: Diagnosis not present

## 2018-12-21 DIAGNOSIS — I69312 Visuospatial deficit and spatial neglect following cerebral infarction: Secondary | ICD-10-CM | POA: Diagnosis not present

## 2018-12-21 DIAGNOSIS — I4891 Unspecified atrial fibrillation: Secondary | ICD-10-CM | POA: Diagnosis not present

## 2018-12-21 DIAGNOSIS — R1312 Dysphagia, oropharyngeal phase: Secondary | ICD-10-CM | POA: Diagnosis not present

## 2018-12-22 ENCOUNTER — Encounter
Payer: No Typology Code available for payment source | Attending: Physical Medicine & Rehabilitation | Admitting: Physical Medicine & Rehabilitation

## 2018-12-22 DIAGNOSIS — L89143 Pressure ulcer of left lower back, stage 3: Secondary | ICD-10-CM | POA: Diagnosis not present

## 2018-12-22 DIAGNOSIS — E7849 Other hyperlipidemia: Secondary | ICD-10-CM | POA: Insufficient documentation

## 2018-12-22 DIAGNOSIS — I4891 Unspecified atrial fibrillation: Secondary | ICD-10-CM | POA: Diagnosis not present

## 2018-12-22 DIAGNOSIS — I69391 Dysphagia following cerebral infarction: Secondary | ICD-10-CM | POA: Diagnosis not present

## 2018-12-22 DIAGNOSIS — I69312 Visuospatial deficit and spatial neglect following cerebral infarction: Secondary | ICD-10-CM | POA: Diagnosis not present

## 2018-12-22 DIAGNOSIS — I69354 Hemiplegia and hemiparesis following cerebral infarction affecting left non-dominant side: Secondary | ICD-10-CM | POA: Diagnosis not present

## 2018-12-22 DIAGNOSIS — I639 Cerebral infarction, unspecified: Secondary | ICD-10-CM | POA: Insufficient documentation

## 2018-12-22 DIAGNOSIS — R1312 Dysphagia, oropharyngeal phase: Secondary | ICD-10-CM | POA: Diagnosis not present

## 2018-12-22 DIAGNOSIS — G8110 Spastic hemiplegia affecting unspecified side: Secondary | ICD-10-CM | POA: Insufficient documentation

## 2018-12-23 DIAGNOSIS — L89143 Pressure ulcer of left lower back, stage 3: Secondary | ICD-10-CM | POA: Diagnosis not present

## 2018-12-23 DIAGNOSIS — I69354 Hemiplegia and hemiparesis following cerebral infarction affecting left non-dominant side: Secondary | ICD-10-CM | POA: Diagnosis not present

## 2018-12-23 DIAGNOSIS — R1312 Dysphagia, oropharyngeal phase: Secondary | ICD-10-CM | POA: Diagnosis not present

## 2018-12-23 DIAGNOSIS — I69312 Visuospatial deficit and spatial neglect following cerebral infarction: Secondary | ICD-10-CM | POA: Diagnosis not present

## 2018-12-23 DIAGNOSIS — I4891 Unspecified atrial fibrillation: Secondary | ICD-10-CM | POA: Diagnosis not present

## 2018-12-23 DIAGNOSIS — I69391 Dysphagia following cerebral infarction: Secondary | ICD-10-CM | POA: Diagnosis not present

## 2018-12-27 DIAGNOSIS — I69354 Hemiplegia and hemiparesis following cerebral infarction affecting left non-dominant side: Secondary | ICD-10-CM | POA: Diagnosis not present

## 2018-12-27 DIAGNOSIS — I69391 Dysphagia following cerebral infarction: Secondary | ICD-10-CM | POA: Diagnosis not present

## 2018-12-27 DIAGNOSIS — L89143 Pressure ulcer of left lower back, stage 3: Secondary | ICD-10-CM | POA: Diagnosis not present

## 2018-12-27 DIAGNOSIS — R1312 Dysphagia, oropharyngeal phase: Secondary | ICD-10-CM | POA: Diagnosis not present

## 2018-12-27 DIAGNOSIS — I69312 Visuospatial deficit and spatial neglect following cerebral infarction: Secondary | ICD-10-CM | POA: Diagnosis not present

## 2018-12-27 DIAGNOSIS — I4891 Unspecified atrial fibrillation: Secondary | ICD-10-CM | POA: Diagnosis not present

## 2018-12-28 DIAGNOSIS — R1312 Dysphagia, oropharyngeal phase: Secondary | ICD-10-CM | POA: Diagnosis not present

## 2018-12-28 DIAGNOSIS — I69312 Visuospatial deficit and spatial neglect following cerebral infarction: Secondary | ICD-10-CM | POA: Diagnosis not present

## 2018-12-28 DIAGNOSIS — L89143 Pressure ulcer of left lower back, stage 3: Secondary | ICD-10-CM | POA: Diagnosis not present

## 2018-12-28 DIAGNOSIS — I4891 Unspecified atrial fibrillation: Secondary | ICD-10-CM | POA: Diagnosis not present

## 2018-12-28 DIAGNOSIS — I69391 Dysphagia following cerebral infarction: Secondary | ICD-10-CM | POA: Diagnosis not present

## 2018-12-28 DIAGNOSIS — I69354 Hemiplegia and hemiparesis following cerebral infarction affecting left non-dominant side: Secondary | ICD-10-CM | POA: Diagnosis not present

## 2018-12-29 DIAGNOSIS — I69391 Dysphagia following cerebral infarction: Secondary | ICD-10-CM | POA: Diagnosis not present

## 2018-12-29 DIAGNOSIS — I69354 Hemiplegia and hemiparesis following cerebral infarction affecting left non-dominant side: Secondary | ICD-10-CM | POA: Diagnosis not present

## 2018-12-29 DIAGNOSIS — I69312 Visuospatial deficit and spatial neglect following cerebral infarction: Secondary | ICD-10-CM | POA: Diagnosis not present

## 2018-12-29 DIAGNOSIS — L89143 Pressure ulcer of left lower back, stage 3: Secondary | ICD-10-CM | POA: Diagnosis not present

## 2018-12-29 DIAGNOSIS — I4891 Unspecified atrial fibrillation: Secondary | ICD-10-CM | POA: Diagnosis not present

## 2018-12-29 DIAGNOSIS — R1312 Dysphagia, oropharyngeal phase: Secondary | ICD-10-CM | POA: Diagnosis not present

## 2018-12-30 DIAGNOSIS — I69354 Hemiplegia and hemiparesis following cerebral infarction affecting left non-dominant side: Secondary | ICD-10-CM | POA: Diagnosis not present

## 2018-12-30 DIAGNOSIS — I4891 Unspecified atrial fibrillation: Secondary | ICD-10-CM | POA: Diagnosis not present

## 2018-12-30 DIAGNOSIS — I69391 Dysphagia following cerebral infarction: Secondary | ICD-10-CM | POA: Diagnosis not present

## 2018-12-30 DIAGNOSIS — I69312 Visuospatial deficit and spatial neglect following cerebral infarction: Secondary | ICD-10-CM | POA: Diagnosis not present

## 2018-12-30 DIAGNOSIS — L89143 Pressure ulcer of left lower back, stage 3: Secondary | ICD-10-CM | POA: Diagnosis not present

## 2018-12-30 DIAGNOSIS — R1312 Dysphagia, oropharyngeal phase: Secondary | ICD-10-CM | POA: Diagnosis not present

## 2018-12-31 DIAGNOSIS — I4891 Unspecified atrial fibrillation: Secondary | ICD-10-CM | POA: Diagnosis not present

## 2018-12-31 DIAGNOSIS — I69391 Dysphagia following cerebral infarction: Secondary | ICD-10-CM | POA: Diagnosis not present

## 2018-12-31 DIAGNOSIS — L89143 Pressure ulcer of left lower back, stage 3: Secondary | ICD-10-CM | POA: Diagnosis not present

## 2018-12-31 DIAGNOSIS — R1312 Dysphagia, oropharyngeal phase: Secondary | ICD-10-CM | POA: Diagnosis not present

## 2018-12-31 DIAGNOSIS — I69312 Visuospatial deficit and spatial neglect following cerebral infarction: Secondary | ICD-10-CM | POA: Diagnosis not present

## 2018-12-31 DIAGNOSIS — I69354 Hemiplegia and hemiparesis following cerebral infarction affecting left non-dominant side: Secondary | ICD-10-CM | POA: Diagnosis not present

## 2019-01-03 DIAGNOSIS — R197 Diarrhea, unspecified: Secondary | ICD-10-CM | POA: Diagnosis not present

## 2019-01-03 DIAGNOSIS — E1165 Type 2 diabetes mellitus with hyperglycemia: Secondary | ICD-10-CM | POA: Diagnosis not present

## 2019-01-03 DIAGNOSIS — G8114 Spastic hemiplegia affecting left nondominant side: Secondary | ICD-10-CM | POA: Diagnosis not present

## 2019-01-03 DIAGNOSIS — E119 Type 2 diabetes mellitus without complications: Secondary | ICD-10-CM | POA: Diagnosis not present

## 2019-01-03 DIAGNOSIS — I10 Essential (primary) hypertension: Secondary | ICD-10-CM | POA: Diagnosis not present

## 2019-01-03 DIAGNOSIS — R21 Rash and other nonspecific skin eruption: Secondary | ICD-10-CM | POA: Diagnosis not present

## 2019-01-03 DIAGNOSIS — I480 Paroxysmal atrial fibrillation: Secondary | ICD-10-CM | POA: Diagnosis not present

## 2019-01-03 DIAGNOSIS — I63311 Cerebral infarction due to thrombosis of right middle cerebral artery: Secondary | ICD-10-CM | POA: Diagnosis not present

## 2019-01-03 DIAGNOSIS — L89159 Pressure ulcer of sacral region, unspecified stage: Secondary | ICD-10-CM | POA: Diagnosis not present

## 2019-01-03 DIAGNOSIS — E782 Mixed hyperlipidemia: Secondary | ICD-10-CM | POA: Diagnosis not present

## 2019-01-03 DIAGNOSIS — Z8673 Personal history of transient ischemic attack (TIA), and cerebral infarction without residual deficits: Secondary | ICD-10-CM | POA: Diagnosis not present

## 2019-01-03 DIAGNOSIS — I69391 Dysphagia following cerebral infarction: Secondary | ICD-10-CM | POA: Diagnosis not present

## 2019-01-04 DIAGNOSIS — I4891 Unspecified atrial fibrillation: Secondary | ICD-10-CM | POA: Diagnosis not present

## 2019-01-04 DIAGNOSIS — L89143 Pressure ulcer of left lower back, stage 3: Secondary | ICD-10-CM | POA: Diagnosis not present

## 2019-01-04 DIAGNOSIS — I69354 Hemiplegia and hemiparesis following cerebral infarction affecting left non-dominant side: Secondary | ICD-10-CM | POA: Diagnosis not present

## 2019-01-04 DIAGNOSIS — I69391 Dysphagia following cerebral infarction: Secondary | ICD-10-CM | POA: Diagnosis not present

## 2019-01-04 DIAGNOSIS — R1312 Dysphagia, oropharyngeal phase: Secondary | ICD-10-CM | POA: Diagnosis not present

## 2019-01-04 DIAGNOSIS — I69312 Visuospatial deficit and spatial neglect following cerebral infarction: Secondary | ICD-10-CM | POA: Diagnosis not present

## 2019-01-06 DIAGNOSIS — Z9181 History of falling: Secondary | ICD-10-CM | POA: Diagnosis not present

## 2019-01-06 DIAGNOSIS — I10 Essential (primary) hypertension: Secondary | ICD-10-CM | POA: Diagnosis not present

## 2019-01-06 DIAGNOSIS — R1312 Dysphagia, oropharyngeal phase: Secondary | ICD-10-CM | POA: Diagnosis not present

## 2019-01-06 DIAGNOSIS — L89143 Pressure ulcer of left lower back, stage 3: Secondary | ICD-10-CM | POA: Diagnosis not present

## 2019-01-06 DIAGNOSIS — Z79891 Long term (current) use of opiate analgesic: Secondary | ICD-10-CM | POA: Diagnosis not present

## 2019-01-06 DIAGNOSIS — K5900 Constipation, unspecified: Secondary | ICD-10-CM | POA: Diagnosis not present

## 2019-01-06 DIAGNOSIS — N318 Other neuromuscular dysfunction of bladder: Secondary | ICD-10-CM | POA: Diagnosis not present

## 2019-01-06 DIAGNOSIS — N179 Acute kidney failure, unspecified: Secondary | ICD-10-CM | POA: Diagnosis not present

## 2019-01-06 DIAGNOSIS — I69391 Dysphagia following cerebral infarction: Secondary | ICD-10-CM | POA: Diagnosis not present

## 2019-01-06 DIAGNOSIS — Z87891 Personal history of nicotine dependence: Secondary | ICD-10-CM | POA: Diagnosis not present

## 2019-01-06 DIAGNOSIS — I4891 Unspecified atrial fibrillation: Secondary | ICD-10-CM | POA: Diagnosis not present

## 2019-01-06 DIAGNOSIS — E785 Hyperlipidemia, unspecified: Secondary | ICD-10-CM | POA: Diagnosis not present

## 2019-01-06 DIAGNOSIS — G8929 Other chronic pain: Secondary | ICD-10-CM | POA: Diagnosis not present

## 2019-01-06 DIAGNOSIS — Z7901 Long term (current) use of anticoagulants: Secondary | ICD-10-CM | POA: Diagnosis not present

## 2019-01-06 DIAGNOSIS — M792 Neuralgia and neuritis, unspecified: Secondary | ICD-10-CM | POA: Diagnosis not present

## 2019-01-06 DIAGNOSIS — E119 Type 2 diabetes mellitus without complications: Secondary | ICD-10-CM | POA: Diagnosis not present

## 2019-01-06 DIAGNOSIS — I69312 Visuospatial deficit and spatial neglect following cerebral infarction: Secondary | ICD-10-CM | POA: Diagnosis not present

## 2019-01-06 DIAGNOSIS — M6282 Rhabdomyolysis: Secondary | ICD-10-CM | POA: Diagnosis not present

## 2019-01-06 DIAGNOSIS — F3289 Other specified depressive episodes: Secondary | ICD-10-CM | POA: Diagnosis not present

## 2019-01-06 DIAGNOSIS — N39498 Other specified urinary incontinence: Secondary | ICD-10-CM | POA: Diagnosis not present

## 2019-01-06 DIAGNOSIS — F419 Anxiety disorder, unspecified: Secondary | ICD-10-CM | POA: Diagnosis not present

## 2019-01-06 DIAGNOSIS — N3281 Overactive bladder: Secondary | ICD-10-CM | POA: Diagnosis not present

## 2019-01-06 DIAGNOSIS — I69354 Hemiplegia and hemiparesis following cerebral infarction affecting left non-dominant side: Secondary | ICD-10-CM | POA: Diagnosis not present

## 2019-01-06 DIAGNOSIS — M6281 Muscle weakness (generalized): Secondary | ICD-10-CM | POA: Diagnosis not present

## 2019-01-08 DIAGNOSIS — D72819 Decreased white blood cell count, unspecified: Secondary | ICD-10-CM | POA: Diagnosis not present

## 2019-01-08 DIAGNOSIS — I10 Essential (primary) hypertension: Secondary | ICD-10-CM | POA: Diagnosis not present

## 2019-01-08 DIAGNOSIS — E782 Mixed hyperlipidemia: Secondary | ICD-10-CM | POA: Diagnosis not present

## 2019-01-08 DIAGNOSIS — E1165 Type 2 diabetes mellitus with hyperglycemia: Secondary | ICD-10-CM | POA: Diagnosis not present

## 2019-01-10 ENCOUNTER — Encounter (HOSPITAL_COMMUNITY): Payer: Self-pay | Admitting: *Deleted

## 2019-01-10 ENCOUNTER — Emergency Department (HOSPITAL_COMMUNITY): Payer: Medicare Other

## 2019-01-10 ENCOUNTER — Emergency Department (HOSPITAL_COMMUNITY)
Admission: EM | Admit: 2019-01-10 | Discharge: 2019-01-10 | Disposition: A | Discharge status: Home / Self Care | Payer: Medicare Other | Attending: Emergency Medicine | Admitting: Emergency Medicine

## 2019-01-10 DIAGNOSIS — I4891 Unspecified atrial fibrillation: Secondary | ICD-10-CM | POA: Diagnosis not present

## 2019-01-10 DIAGNOSIS — E119 Type 2 diabetes mellitus without complications: Secondary | ICD-10-CM | POA: Insufficient documentation

## 2019-01-10 DIAGNOSIS — R531 Weakness: Secondary | ICD-10-CM | POA: Diagnosis not present

## 2019-01-10 DIAGNOSIS — R569 Unspecified convulsions: Secondary | ICD-10-CM | POA: Insufficient documentation

## 2019-01-10 DIAGNOSIS — E876 Hypokalemia: Secondary | ICD-10-CM | POA: Diagnosis not present

## 2019-01-10 DIAGNOSIS — R52 Pain, unspecified: Secondary | ICD-10-CM | POA: Diagnosis not present

## 2019-01-10 DIAGNOSIS — G4489 Other headache syndrome: Secondary | ICD-10-CM | POA: Diagnosis not present

## 2019-01-10 DIAGNOSIS — I10 Essential (primary) hypertension: Secondary | ICD-10-CM | POA: Insufficient documentation

## 2019-01-10 DIAGNOSIS — Z87891 Personal history of nicotine dependence: Secondary | ICD-10-CM | POA: Insufficient documentation

## 2019-01-10 DIAGNOSIS — Z7401 Bed confinement status: Secondary | ICD-10-CM | POA: Diagnosis not present

## 2019-01-10 LAB — URINALYSIS, ROUTINE W REFLEX MICROSCOPIC
Bilirubin Urine: NEGATIVE
Glucose, UA: NEGATIVE mg/dL
Ketones, ur: 5 mg/dL — AB
Nitrite: NEGATIVE
Protein, ur: 30 mg/dL — AB
Specific Gravity, Urine: 1.021 (ref 1.005–1.030)
pH: 6 (ref 5.0–8.0)

## 2019-01-10 LAB — CBC WITH DIFFERENTIAL/PLATELET
Abs Immature Granulocytes: 0.02 10*3/uL (ref 0.00–0.07)
Basophils Absolute: 0.1 10*3/uL (ref 0.0–0.1)
Basophils Relative: 1 %
Eosinophils Absolute: 0.1 10*3/uL (ref 0.0–0.5)
Eosinophils Relative: 1 %
HCT: 47.7 % — ABNORMAL HIGH (ref 36.0–46.0)
Hemoglobin: 15.3 g/dL — ABNORMAL HIGH (ref 12.0–15.0)
Immature Granulocytes: 0 %
Lymphocytes Relative: 14 %
Lymphs Abs: 1.8 10*3/uL (ref 0.7–4.0)
MCH: 27.3 pg (ref 26.0–34.0)
MCHC: 32.1 g/dL (ref 30.0–36.0)
MCV: 85.2 fL (ref 80.0–100.0)
Monocytes Absolute: 0.9 10*3/uL (ref 0.1–1.0)
Monocytes Relative: 7 %
Neutro Abs: 9.6 10*3/uL — ABNORMAL HIGH (ref 1.7–7.7)
Neutrophils Relative %: 77 %
Platelets: 220 10*3/uL (ref 150–400)
RBC: 5.6 MIL/uL — ABNORMAL HIGH (ref 3.87–5.11)
RDW: 14.8 % (ref 11.5–15.5)
WBC: 12.5 10*3/uL — ABNORMAL HIGH (ref 4.0–10.5)
nRBC: 0 % (ref 0.0–0.2)

## 2019-01-10 LAB — COMPREHENSIVE METABOLIC PANEL
ALT: 55 U/L — ABNORMAL HIGH (ref 0–44)
AST: 42 U/L — ABNORMAL HIGH (ref 15–41)
Albumin: 4.4 g/dL (ref 3.5–5.0)
Alkaline Phosphatase: 66 U/L (ref 38–126)
Anion gap: 14 (ref 5–15)
BUN: 15 mg/dL (ref 8–23)
CO2: 27 mmol/L (ref 22–32)
Calcium: 10.3 mg/dL (ref 8.9–10.3)
Chloride: 97 mmol/L — ABNORMAL LOW (ref 98–111)
Creatinine, Ser: 0.73 mg/dL (ref 0.44–1.00)
GFR calc Af Amer: >60 mL/min (ref >60)
GFR calc non Af Amer: >60 mL/min (ref >60)
Glucose, Bld: 99 mg/dL (ref 70–99)
Potassium: 2.8 mmol/L — ABNORMAL LOW (ref 3.5–5.1)
Sodium: 138 mmol/L (ref 135–145)
Total Bilirubin: 0.9 mg/dL (ref 0.3–1.2)
Total Protein: 8.1 g/dL (ref 6.5–8.1)

## 2019-01-10 MED ORDER — POTASSIUM CHLORIDE CRYS ER 20 MEQ PO TBCR: 40.0000 meq | EXTENDED_RELEASE_TABLET | Freq: Every day | ORAL | 0 refills | Status: DC

## 2019-01-10 NOTE — ED Provider Notes (Signed)
Emergency Department Provider Note   I have reviewed the triage vital signs and the nursing notes.   HISTORY  Chief Complaint Seizures   HPI Rachel Fox is a 73 y.o. female with PMH of A-fib, CVA with left side deficits, HLD, HTN, and DM presents to the emergency department with concern for seizure-like activity.  Patient was home today with her health aide when she reports acute onset "shaking" involving with neck, arm, and leg.  She denies prior history of similar episodes.  She remained awake and alert during the event.  She estimates that it lasted for approximately 2 minutes and then resolved.  She denies any worsening weakness/numbness symptoms.  She has paralysis of the left arm and leg from a recent stroke.  She notes a headache but states that has been intermittent since her stroke and not new or suddenly worse.  Denies fevers, chills, cough.   Past Medical History:  Diagnosis Date  . Atrial fibrillation ([place])   . CVA (cerebral vascular accident) ([place])   . High cholesterol   . Hypertension   . Type 2 diabetes mellitus [facility])     Patient Active Problem List   Diagnosis Date Noted  . Spastic hemiparesis ([place])   . Labile blood glucose   . Acute pain of left shoulder   . AKI (acute kidney injury) ([place])   . Controlled type 2 diabetes mellitus with hyperglycemia, without Timm Bonenberger-term current use of insulin ([place])   . Dysphagia, post-stroke   . Atrial fibrillation ([place])   . Right middle cerebral artery stroke ([place]) 09/27/2018  . Pressure injury of skin 09/21/2018  . Cerebrovascular accident (CVA) with involvement of left side of body ([place]) 09/20/2018  . Atrial fibrillation with RVR ([place]) 09/20/2018  . Hypertension   . Rhabdomyolysis   . Type 2 diabetes mellitus ([place])   . Hyperlipidemia   . Hyperbilirubinemia   . Leukocytosis   . Hypernatremia     Past Surgical History:  Procedure Laterality Date  . ABDOMINAL HYSTERECTOMY    . TUBAL LIGATION      Allergies  Lactose intolerance (gi)  Family History  Problem Relation Age of Onset  . Stroke Mother   . Heart attack Father   . Stroke Sister   . Heart attack Sister     Social History Social History   Tobacco Use  . Smoking status: Former Research scientist (life sciences)  . Smokeless tobacco: Never Used  Substance Use Topics  . Alcohol use: Never    Frequency: Never  . Drug use: Never    Review of Systems  Constitutional: No fever/chills Eyes: No visual changes. ENT: No sore throat. Cardiovascular: Denies chest pain. Respiratory: Denies shortness of breath. Gastrointestinal: No abdominal pain.  No nausea, no vomiting.  No diarrhea.  No constipation. Genitourinary: Negative for dysuria. Musculoskeletal: Negative for back pain. Skin: Negative for rash. Neurological: Positive chronic HA and shaking activity.   10-point ROS otherwise negative.  ____________________________________________   PHYSICAL EXAM:  VITAL SIGNS: ED Triage Vitals  Enc Vitals Group     BP 01/10/19 1427 (!) 149/62     Pulse Rate 01/10/19 1427 70     Resp 01/10/19 1427 17     Temp 01/10/19 1427 98.3 F (36.8 C)     Temp Source 01/10/19 1427 Oral     SpO2 01/10/19 1427 98 %     Weight 01/10/19 1433 144 lb (65.3 kg)     Height 01/10/19 1433 5\' 3"  (1.6 m)   Constitutional:  Alert and oriented. Well appearing and in no acute distress. Eyes: Conjunctivae are normal. PERRL. Head: Atraumatic. Nose: No congestion/rhinnorhea. Mouth/Throat: Mucous membranes are moist.  Neck: No stridor.  Cardiovascular: Normal rate, regular rhythm. Good peripheral circulation. Grossly normal heart sounds.   Respiratory: Normal respiratory effort.  No retractions. Lungs CTAB. Gastrointestinal: Soft and nontender. No distention.  Musculoskeletal: No lower extremity tenderness nor edema. No gross deformities of extremities. Neurologic:  Normal speech and language. Baseline left hemiparesis.  Skin:  Skin is warm, dry and intact. No rash noted.    ____________________________________________   LABS (all labs ordered are listed, but only abnormal results are displayed)  Labs Reviewed  COMPREHENSIVE METABOLIC PANEL - Abnormal; Notable for the following components:      Result Value   Potassium 2.8 (*)    Chloride 97 (*)    AST 42 (*)    ALT 55 (*)    All other components within normal limits  CBC WITH DIFFERENTIAL/PLATELET - Abnormal; Notable for the following components:   WBC 12.5 (*)    RBC 5.60 (*)    Hemoglobin 15.3 (*)    HCT 47.7 (*)    Neutro Abs 9.6 (*)    All other components within normal limits  URINALYSIS, ROUTINE W REFLEX MICROSCOPIC - Abnormal; Notable for the following components:   Hgb urine dipstick SMALL (*)    Ketones, ur 5 (*)    Protein, ur 30 (*)    Leukocytes,Ua TRACE (*)    Bacteria, UA RARE (*)    All other components within normal limits   ____________________________________________  EKG   EKG Interpretation  Date/Time:  Monday January 10 2019 15:49:40 EDT Ventricular Rate:  67 PR Interval:    QRS Duration: 95 QT Interval:  419 QTC Calculation: 443 R Axis:   67 Text Interpretation: Sinus rhythm Left atrial enlargement Borderline T wave abnormalities No STEMI Confirmed by Alona BeneLong, Zi [place] [phone]([id number]) on 01/10/2019 4:42:29 PM       ____________________________________________  RADIOLOGY  Ct Head Wo Contrast  Result Date: 01/10/2019 CLINICAL DATA:  New onset seizure EXAM: CT HEAD WITHOUT CONTRAST TECHNIQUE: Contiguous axial images were obtained from the base of the skull through the vertex without intravenous contrast. COMPARISON:  09/20/2018 FINDINGS: Brain: No brainstem or cerebellar abnormality. The left cerebral hemisphere appears normal. There is old infarction in the right insular region affecting the temporal lobe and frontal operculum. I do not see evidence of an acute infarction, mass lesion, hemorrhage, hydrocephalus or extra-axial collection. Vascular: There is atherosclerotic  calcification of the major vessels at the base of the brain. Skull: Negative Sinuses/Orbits: Clear/normal Other: None IMPRESSION: No acute finding by CT. Evolutionary changes of a now old infarction in the right MCA territory as outlined above. The brain shows encephalomalacia and gliosis that could possibly serve as a seizure focus. Electronically Signed   By: Paulina FusiMark  Shogry M.D.   On: 01/10/2019 18:28    ____________________________________________   PROCEDURES  Procedure(s) performed:   Procedures  None ____________________________________________   INITIAL IMPRESSION / ASSESSMENT AND PLAN / ED COURSE  Pertinent labs & imaging results that were available during my care of the patient were reviewed by me and considered in my medical decision making (see chart for details).   Patient presents to the ED with the first episode of seizure like activity. Episodes was not generalized. No clear post-ictal state. No stigmata of seizure. Patient's CVA does represent possible seizure focus. Labs interpreted with mild hypokalemia. No EKG changes. Plan  for PO replacement. No AEDs with first episode but will refer to Neuro as outpatient for outpatient EEG and seizure evaluation. Patient's son at bedside for discussion. Patient does not drive. Discussed ED return precautions.   ____________________________________________  FINAL CLINICAL IMPRESSION(S) / ED DIAGNOSES  Final diagnoses:  Seizure-like activity (HCC)  Hypokalemia    NEW OUTPATIENT MEDICATIONS STARTED DURING THIS VISIT:  Discharge Medication List as of 01/10/2019  6:59 PM    START taking these medications   Details  potassium chloride SA (KLOR-CON) 20 MEQ tablet Take 2 tablets (40 mEq total) by mouth daily for 5 days., Starting Mon 01/10/2019, Until Sat 01/15/2019, Print        Note:  This document was prepared using Dragon voice recognition software and may include unintentional dictation errors.  Alona Bene, MD, [facility]  Emergency Medicine    Gerlean Cid, Arlyss Repress, MD 01/11/19 1014

## 2019-01-10 NOTE — Discharge Instructions (Signed)
You have been seen in the emergency department today for a likely seizure.  Your workup today including labs are within normal limits.  Please follow up with your doctor as soon as possible regarding today's emergency department visit and your likely seizure.  You will also need to follow up with a neurologist as soon as possible, please call for appointment.  As we have discussed it is very important that you DO NOT drive until you have been seen and cleared by your neurologist.  Please drink plenty of fluids, get plenty of sleep and avoid any alcohol or drug use.  Return to the emergency department if you have any further seizures, develop any weakness/numbness of any arm/leg, confusion, slurred speech, or sudden/severe headache.  

## 2019-01-10 NOTE — ED Triage Notes (Signed)
Patient comes from home via [facility] due to reported "seizure like" activity witnessed by caregiver.  Patient has left sided baseline deficits.

## 2019-01-11 DIAGNOSIS — R4182 Altered mental status, unspecified: Secondary | ICD-10-CM | POA: Diagnosis not present

## 2019-01-11 DIAGNOSIS — E876 Hypokalemia: Secondary | ICD-10-CM | POA: Diagnosis not present

## 2019-01-11 DIAGNOSIS — D72829 Elevated white blood cell count, unspecified: Secondary | ICD-10-CM | POA: Diagnosis not present

## 2019-01-13 ENCOUNTER — Telehealth: Payer: Self-pay

## 2019-01-13 ENCOUNTER — Telehealth (INDEPENDENT_AMBULATORY_CARE_PROVIDER_SITE_OTHER): Payer: Medicare Other | Admitting: Cardiology

## 2019-01-13 ENCOUNTER — Encounter: Payer: Self-pay | Admitting: Cardiology

## 2019-01-13 VITALS — BP 108/78 | HR 64 | Ht 63.0 in | Wt 144.0 lb

## 2019-01-13 DIAGNOSIS — I4891 Unspecified atrial fibrillation: Secondary | ICD-10-CM | POA: Diagnosis not present

## 2019-01-13 DIAGNOSIS — I69312 Visuospatial deficit and spatial neglect following cerebral infarction: Secondary | ICD-10-CM | POA: Diagnosis not present

## 2019-01-13 DIAGNOSIS — L89143 Pressure ulcer of left lower back, stage 3: Secondary | ICD-10-CM | POA: Diagnosis not present

## 2019-01-13 DIAGNOSIS — I480 Paroxysmal atrial fibrillation: Secondary | ICD-10-CM

## 2019-01-13 DIAGNOSIS — I69354 Hemiplegia and hemiparesis following cerebral infarction affecting left non-dominant side: Secondary | ICD-10-CM | POA: Diagnosis not present

## 2019-01-13 DIAGNOSIS — R1312 Dysphagia, oropharyngeal phase: Secondary | ICD-10-CM | POA: Diagnosis not present

## 2019-01-13 DIAGNOSIS — I69391 Dysphagia following cerebral infarction: Secondary | ICD-10-CM | POA: Diagnosis not present

## 2019-01-13 NOTE — Progress Notes (Signed)
Virtual Visit via Telephone Note   This visit type was conducted due to national recommendations for restrictions regarding the COVID-19 Pandemic (e.g. social distancing) in an effort to limit this patient's exposure and mitigate transmission in our community.  Due to her co-morbid illnesses, this patient is at least at moderate risk for complications without adequate follow up.  This format is felt to be most appropriate for this patient at this time.  The patient did not have access to video technology/had technical difficulties with video requiring transitioning to audio format only (telephone).  All issues noted in this document were discussed and addressed.  No physical exam could be performed with this format.  Please refer to the patient's chart for her  consent to telehealth for [facility].   Date:  01/13/2019   ID:  [name], DOB 08-21-1945, MRN [id number]  Patient Location: Home Provider Location: Office  PCP:  Benita Stabile, MD  Cardiologist:  Dina Rich, MD  Electrophysiologist:  None   Evaluation Performed:  Follow-Up Visit  Chief Complaint:  Follow up visit  History of Present Illness:    Rachel Fox is a 73 y.o. female seen today for a focused visit on her history of afib.   1. Afib - new diagnosis during 09/2018 admission, presented with CVA - was started on anticoag during that admission once cleared by neuro - she was managed with dilt, amio,digoxin.   - at f/u digoxin was stopped.  - not able to take long acting dilt as she must crush her pills - no recent palpitations, has done well with recent med changes. EKG from ER visit just a few days ago shows she is maintaining sinus rhythm   2. CVA - admitted 09/2018 with CVA    3. Hypokalemia - seen in ER 01/10/19 with seizure like activity, routine labs showed K 2.8 - given oral replacement daily x 5 days - she reports pcp is aware and going to repeat labs    The patient does not  have symptoms concerning for COVID-19 infection (fever, chills, cough, or new shortness of breath).    Past Medical History:  Diagnosis Date  . Atrial fibrillation (HCC)   . CVA (cerebral vascular accident) (HCC)   . High cholesterol   . Hypertension   . Type 2 diabetes mellitus (HCC)    Past Surgical History:  Procedure Laterality Date  . ABDOMINAL HYSTERECTOMY    . TUBAL LIGATION       Current Meds  Medication Sig  . acetaminophen (TYLENOL) 325 MG tablet Take 2 tablets (650 mg total) by mouth every 4 (four) hours as needed for mild pain (or temp > 37.5 C (99.5 F)).  Marland Kitchen amiodarone (PACERONE) 200 MG tablet Take 1 tablet (200 mg total) by mouth daily.  Marland Kitchen apixaban (ELIQUIS) 5 MG TABS tablet Take 1 tablet (5 mg total) by mouth 2 (two) times daily.  Marland Kitchen atorvastatin (LIPITOR) 80 MG tablet Take 1 tablet (80 mg total) by mouth daily at 6 PM.  . Cholecalciferol (VITAMIN D3) 50 MCG (2000 UT) TABS Take by mouth.  . diclofenac sodium (VOLTAREN) 1 % GEL Apply 2 g topically 4 (four) times daily.  Marland Kitchen diltiazem (CARDIZEM) 90 MG tablet Take 1 tablet (90 mg total) by mouth 3 (three) times daily.  . DULoxetine (CYMBALTA) 30 MG capsule Take 30 mg by mouth daily.  Marland Kitchen HYDROcodone-acetaminophen (NORCO/VICODIN) 5-325 MG tablet Take 1 tablet by mouth every 6 (six) hours as needed  for moderate pain.  . mirabegron ER (MYRBETRIQ) 25 MG TB24 tablet Take 1 tablet (25 mg total) by mouth daily.  . polyethylene glycol (MIRALAX / GLYCOLAX) 17 g packet Take 17 g by mouth daily.  . potassium chloride SA (KLOR-CON) 20 MEQ tablet Take 2 tablets (40 mEq total) by mouth daily for 5 days.  . tizanidine (ZANAFLEX) 2 MG capsule Take 2 mg by mouth 3 (three) times daily.  Marland Kitchen topiramate (TOPAMAX) 25 MG tablet Take 1 tablet (25 mg total) by mouth 2 (two) times daily.  . traMADol (ULTRAM) 50 MG tablet Take 1 tablet (50 mg total) by mouth every 12 (twelve) hours as needed for severe pain.     Allergies:   Lactose intolerance (gi)    Social History   Tobacco Use  . Smoking status: Former Smoker    Quit date: 04/14/1988    Years since quitting: 30.7  . Smokeless tobacco: Never Used  Substance Use Topics  . Alcohol use: Never    Frequency: Never  . Drug use: Never     Family Hx: The patient's family history includes Heart attack in her father and sister; Stroke in her mother and sister.  ROS:   Please see the history of present illness.     All other systems reviewed and are negative.   Prior CV studies:   The following studies were reviewed today:   Labs/Other Tests and Data Reviewed:    EKG:  No ECG reviewed.  Recent Labs: 09/20/2018: B Natriuretic Peptide 485.0 09/21/2018: TSH 1.655 09/26/2018: Magnesium 2.0 01/10/2019: ALT 55; BUN 15; Creatinine, Ser 0.73; Hemoglobin 15.3; Platelets 220; Potassium 2.8; Sodium 138   Recent Lipid Panel Lab Results  Component Value Date/Time   CHOL 184 09/21/2018 11:20 AM   TRIG 107 09/21/2018 11:20 AM   HDL 42 09/21/2018 11:20 AM   CHOLHDL 4.4 09/21/2018 11:20 AM   LDLCALC 121 (H) 09/21/2018 11:20 AM    Wt Readings from Last 3 Encounters:  01/13/19 144 lb (65.3 kg)  01/10/19 144 lb (65.3 kg)  10/20/18 158 lb 15.2 oz (72.1 kg)     Objective:    Vital Signs:  BP 108/78   Pulse 64   Ht 5\' 3"  (1.6 m)   Wt 144 lb (65.3 kg)   BMI 25.51 kg/m    Normal affect. Normal speech pattern and tone. Comfortable, no apparent distress. No audible signs of SOB or wheezing.   ASSESSMENT & PLAN:   1. Afib - PAF, mainting SR on current regimen. Continue current meds including anticoag.    COVID-19 Education: The signs and symptoms of COVID-19 were discussed with the patient and how to seek care for testing (follow up with PCP or arrange E-visit).  The importance of social distancing was discussed today.  Time:   Today, I have spent 13 minutes with the patient with telehealth technology discussing the above problems.     Medication Adjustments/Labs and Tests  Ordered: Current medicines are reviewed at length with the patient today.  Concerns regarding medicines are outlined above.   Tests Ordered: No orders of the defined types were placed in this encounter.   Medication Changes: No orders of the defined types were placed in this encounter.   Follow Up:  In Person in 6 month(s)  Signed, Carlyle Dolly, MD  01/13/2019 11:24 AM         Arnoldo Lenis, M.D., F.A.C.C.

## 2019-01-13 NOTE — Patient Instructions (Signed)

## 2019-01-13 NOTE — Telephone Encounter (Signed)

## 2019-01-14 DIAGNOSIS — I69312 Visuospatial deficit and spatial neglect following cerebral infarction: Secondary | ICD-10-CM | POA: Diagnosis not present

## 2019-01-14 DIAGNOSIS — I69354 Hemiplegia and hemiparesis following cerebral infarction affecting left non-dominant side: Secondary | ICD-10-CM | POA: Diagnosis not present

## 2019-01-14 DIAGNOSIS — R1312 Dysphagia, oropharyngeal phase: Secondary | ICD-10-CM | POA: Diagnosis not present

## 2019-01-14 DIAGNOSIS — L89143 Pressure ulcer of left lower back, stage 3: Secondary | ICD-10-CM | POA: Diagnosis not present

## 2019-01-14 DIAGNOSIS — I4891 Unspecified atrial fibrillation: Secondary | ICD-10-CM | POA: Diagnosis not present

## 2019-01-14 DIAGNOSIS — I69391 Dysphagia following cerebral infarction: Secondary | ICD-10-CM | POA: Diagnosis not present

## 2019-01-17 DIAGNOSIS — I4891 Unspecified atrial fibrillation: Secondary | ICD-10-CM | POA: Diagnosis not present

## 2019-01-17 DIAGNOSIS — L89143 Pressure ulcer of left lower back, stage 3: Secondary | ICD-10-CM | POA: Diagnosis not present

## 2019-01-17 DIAGNOSIS — I69354 Hemiplegia and hemiparesis following cerebral infarction affecting left non-dominant side: Secondary | ICD-10-CM | POA: Diagnosis not present

## 2019-01-17 DIAGNOSIS — I69391 Dysphagia following cerebral infarction: Secondary | ICD-10-CM | POA: Diagnosis not present

## 2019-01-17 DIAGNOSIS — I69312 Visuospatial deficit and spatial neglect following cerebral infarction: Secondary | ICD-10-CM | POA: Diagnosis not present

## 2019-01-17 DIAGNOSIS — R1312 Dysphagia, oropharyngeal phase: Secondary | ICD-10-CM | POA: Diagnosis not present

## 2019-01-19 DIAGNOSIS — L89143 Pressure ulcer of left lower back, stage 3: Secondary | ICD-10-CM | POA: Diagnosis not present

## 2019-01-19 DIAGNOSIS — I69312 Visuospatial deficit and spatial neglect following cerebral infarction: Secondary | ICD-10-CM | POA: Diagnosis not present

## 2019-01-19 DIAGNOSIS — I4891 Unspecified atrial fibrillation: Secondary | ICD-10-CM | POA: Diagnosis not present

## 2019-01-19 DIAGNOSIS — I69391 Dysphagia following cerebral infarction: Secondary | ICD-10-CM | POA: Diagnosis not present

## 2019-01-19 DIAGNOSIS — R1312 Dysphagia, oropharyngeal phase: Secondary | ICD-10-CM | POA: Diagnosis not present

## 2019-01-19 DIAGNOSIS — I69354 Hemiplegia and hemiparesis following cerebral infarction affecting left non-dominant side: Secondary | ICD-10-CM | POA: Diagnosis not present

## 2019-01-21 DIAGNOSIS — I69391 Dysphagia following cerebral infarction: Secondary | ICD-10-CM | POA: Diagnosis not present

## 2019-01-21 DIAGNOSIS — I69312 Visuospatial deficit and spatial neglect following cerebral infarction: Secondary | ICD-10-CM | POA: Diagnosis not present

## 2019-01-21 DIAGNOSIS — R1312 Dysphagia, oropharyngeal phase: Secondary | ICD-10-CM | POA: Diagnosis not present

## 2019-01-21 DIAGNOSIS — I69354 Hemiplegia and hemiparesis following cerebral infarction affecting left non-dominant side: Secondary | ICD-10-CM | POA: Diagnosis not present

## 2019-01-21 DIAGNOSIS — L89143 Pressure ulcer of left lower back, stage 3: Secondary | ICD-10-CM | POA: Diagnosis not present

## 2019-01-21 DIAGNOSIS — I4891 Unspecified atrial fibrillation: Secondary | ICD-10-CM | POA: Diagnosis not present

## 2019-01-24 DIAGNOSIS — I69312 Visuospatial deficit and spatial neglect following cerebral infarction: Secondary | ICD-10-CM | POA: Diagnosis not present

## 2019-01-24 DIAGNOSIS — L89143 Pressure ulcer of left lower back, stage 3: Secondary | ICD-10-CM | POA: Diagnosis not present

## 2019-01-24 DIAGNOSIS — I69391 Dysphagia following cerebral infarction: Secondary | ICD-10-CM | POA: Diagnosis not present

## 2019-01-24 DIAGNOSIS — I69354 Hemiplegia and hemiparesis following cerebral infarction affecting left non-dominant side: Secondary | ICD-10-CM | POA: Diagnosis not present

## 2019-01-24 DIAGNOSIS — R1312 Dysphagia, oropharyngeal phase: Secondary | ICD-10-CM | POA: Diagnosis not present

## 2019-01-24 DIAGNOSIS — I4891 Unspecified atrial fibrillation: Secondary | ICD-10-CM | POA: Diagnosis not present

## 2019-01-25 DIAGNOSIS — R1312 Dysphagia, oropharyngeal phase: Secondary | ICD-10-CM | POA: Diagnosis not present

## 2019-01-25 DIAGNOSIS — I69354 Hemiplegia and hemiparesis following cerebral infarction affecting left non-dominant side: Secondary | ICD-10-CM | POA: Diagnosis not present

## 2019-01-25 DIAGNOSIS — I4891 Unspecified atrial fibrillation: Secondary | ICD-10-CM | POA: Diagnosis not present

## 2019-01-25 DIAGNOSIS — I69312 Visuospatial deficit and spatial neglect following cerebral infarction: Secondary | ICD-10-CM | POA: Diagnosis not present

## 2019-01-25 DIAGNOSIS — L89143 Pressure ulcer of left lower back, stage 3: Secondary | ICD-10-CM | POA: Diagnosis not present

## 2019-01-25 DIAGNOSIS — I69391 Dysphagia following cerebral infarction: Secondary | ICD-10-CM | POA: Diagnosis not present

## 2019-01-26 DIAGNOSIS — R4182 Altered mental status, unspecified: Secondary | ICD-10-CM | POA: Diagnosis not present

## 2019-01-26 DIAGNOSIS — E876 Hypokalemia: Secondary | ICD-10-CM | POA: Diagnosis not present

## 2019-01-26 DIAGNOSIS — E782 Mixed hyperlipidemia: Secondary | ICD-10-CM | POA: Diagnosis not present

## 2019-01-26 DIAGNOSIS — R1319 Other dysphagia: Secondary | ICD-10-CM | POA: Diagnosis not present

## 2019-01-26 DIAGNOSIS — D72829 Elevated white blood cell count, unspecified: Secondary | ICD-10-CM | POA: Diagnosis not present

## 2019-01-26 DIAGNOSIS — I10 Essential (primary) hypertension: Secondary | ICD-10-CM | POA: Diagnosis not present

## 2019-01-26 DIAGNOSIS — I69391 Dysphagia following cerebral infarction: Secondary | ICD-10-CM | POA: Diagnosis not present

## 2019-01-26 DIAGNOSIS — E1165 Type 2 diabetes mellitus with hyperglycemia: Secondary | ICD-10-CM | POA: Diagnosis not present

## 2019-01-26 DIAGNOSIS — E43 Unspecified severe protein-calorie malnutrition: Secondary | ICD-10-CM | POA: Diagnosis not present

## 2019-01-26 DIAGNOSIS — I480 Paroxysmal atrial fibrillation: Secondary | ICD-10-CM | POA: Diagnosis not present

## 2019-01-26 DIAGNOSIS — R443 Hallucinations, unspecified: Secondary | ICD-10-CM | POA: Diagnosis not present

## 2019-01-26 DIAGNOSIS — G8114 Spastic hemiplegia affecting left nondominant side: Secondary | ICD-10-CM | POA: Diagnosis not present

## 2019-01-26 DIAGNOSIS — Z8673 Personal history of transient ischemic attack (TIA), and cerebral infarction without residual deficits: Secondary | ICD-10-CM | POA: Diagnosis not present

## 2019-01-26 DIAGNOSIS — Z6826 Body mass index (BMI) 26.0-26.9, adult: Secondary | ICD-10-CM | POA: Diagnosis not present

## 2019-01-26 DIAGNOSIS — E119 Type 2 diabetes mellitus without complications: Secondary | ICD-10-CM | POA: Diagnosis not present

## 2019-01-26 DIAGNOSIS — N390 Urinary tract infection, site not specified: Secondary | ICD-10-CM | POA: Diagnosis not present

## 2019-01-26 DIAGNOSIS — D72819 Decreased white blood cell count, unspecified: Secondary | ICD-10-CM | POA: Diagnosis not present

## 2019-01-26 DIAGNOSIS — K219 Gastro-esophageal reflux disease without esophagitis: Secondary | ICD-10-CM | POA: Diagnosis not present

## 2019-02-01 DIAGNOSIS — L89143 Pressure ulcer of left lower back, stage 3: Secondary | ICD-10-CM | POA: Diagnosis not present

## 2019-02-01 DIAGNOSIS — I69391 Dysphagia following cerebral infarction: Secondary | ICD-10-CM | POA: Diagnosis not present

## 2019-02-01 DIAGNOSIS — R1312 Dysphagia, oropharyngeal phase: Secondary | ICD-10-CM | POA: Diagnosis not present

## 2019-02-01 DIAGNOSIS — I69312 Visuospatial deficit and spatial neglect following cerebral infarction: Secondary | ICD-10-CM | POA: Diagnosis not present

## 2019-02-01 DIAGNOSIS — I69354 Hemiplegia and hemiparesis following cerebral infarction affecting left non-dominant side: Secondary | ICD-10-CM | POA: Diagnosis not present

## 2019-02-01 DIAGNOSIS — I4891 Unspecified atrial fibrillation: Secondary | ICD-10-CM | POA: Diagnosis not present

## 2019-02-02 DIAGNOSIS — R1312 Dysphagia, oropharyngeal phase: Secondary | ICD-10-CM | POA: Diagnosis not present

## 2019-02-02 DIAGNOSIS — I4891 Unspecified atrial fibrillation: Secondary | ICD-10-CM | POA: Diagnosis not present

## 2019-02-02 DIAGNOSIS — I69391 Dysphagia following cerebral infarction: Secondary | ICD-10-CM | POA: Diagnosis not present

## 2019-02-02 DIAGNOSIS — I69312 Visuospatial deficit and spatial neglect following cerebral infarction: Secondary | ICD-10-CM | POA: Diagnosis not present

## 2019-02-02 DIAGNOSIS — I69354 Hemiplegia and hemiparesis following cerebral infarction affecting left non-dominant side: Secondary | ICD-10-CM | POA: Diagnosis not present

## 2019-02-02 DIAGNOSIS — L89143 Pressure ulcer of left lower back, stage 3: Secondary | ICD-10-CM | POA: Diagnosis not present

## 2019-02-03 DIAGNOSIS — I69391 Dysphagia following cerebral infarction: Secondary | ICD-10-CM | POA: Diagnosis not present

## 2019-02-03 DIAGNOSIS — R1312 Dysphagia, oropharyngeal phase: Secondary | ICD-10-CM | POA: Diagnosis not present

## 2019-02-03 DIAGNOSIS — I69312 Visuospatial deficit and spatial neglect following cerebral infarction: Secondary | ICD-10-CM | POA: Diagnosis not present

## 2019-02-03 DIAGNOSIS — L89143 Pressure ulcer of left lower back, stage 3: Secondary | ICD-10-CM | POA: Diagnosis not present

## 2019-02-03 DIAGNOSIS — I69354 Hemiplegia and hemiparesis following cerebral infarction affecting left non-dominant side: Secondary | ICD-10-CM | POA: Diagnosis not present

## 2019-02-03 DIAGNOSIS — I4891 Unspecified atrial fibrillation: Secondary | ICD-10-CM | POA: Diagnosis not present

## 2019-02-15 DIAGNOSIS — I10 Essential (primary) hypertension: Secondary | ICD-10-CM | POA: Diagnosis not present

## 2019-02-15 DIAGNOSIS — Z8673 Personal history of transient ischemic attack (TIA), and cerebral infarction without residual deficits: Secondary | ICD-10-CM | POA: Diagnosis not present

## 2019-02-15 DIAGNOSIS — E782 Mixed hyperlipidemia: Secondary | ICD-10-CM | POA: Diagnosis not present

## 2019-02-15 DIAGNOSIS — I69391 Dysphagia following cerebral infarction: Secondary | ICD-10-CM | POA: Diagnosis not present

## 2019-02-15 DIAGNOSIS — I480 Paroxysmal atrial fibrillation: Secondary | ICD-10-CM | POA: Diagnosis not present

## 2019-02-15 DIAGNOSIS — E1165 Type 2 diabetes mellitus with hyperglycemia: Secondary | ICD-10-CM | POA: Diagnosis not present

## 2019-02-15 DIAGNOSIS — G8114 Spastic hemiplegia affecting left nondominant side: Secondary | ICD-10-CM | POA: Diagnosis not present

## 2019-02-16 DIAGNOSIS — R1312 Dysphagia, oropharyngeal phase: Secondary | ICD-10-CM | POA: Diagnosis not present

## 2019-02-16 DIAGNOSIS — Z6826 Body mass index (BMI) 26.0-26.9, adult: Secondary | ICD-10-CM | POA: Diagnosis not present

## 2019-02-16 DIAGNOSIS — N390 Urinary tract infection, site not specified: Secondary | ICD-10-CM | POA: Diagnosis not present

## 2019-02-16 DIAGNOSIS — D72819 Decreased white blood cell count, unspecified: Secondary | ICD-10-CM | POA: Diagnosis not present

## 2019-02-16 DIAGNOSIS — I69354 Hemiplegia and hemiparesis following cerebral infarction affecting left non-dominant side: Secondary | ICD-10-CM | POA: Diagnosis not present

## 2019-02-16 DIAGNOSIS — E876 Hypokalemia: Secondary | ICD-10-CM | POA: Diagnosis not present

## 2019-02-16 DIAGNOSIS — K219 Gastro-esophageal reflux disease without esophagitis: Secondary | ICD-10-CM | POA: Diagnosis not present

## 2019-02-16 DIAGNOSIS — R944 Abnormal results of kidney function studies: Secondary | ICD-10-CM | POA: Diagnosis not present

## 2019-02-16 DIAGNOSIS — E119 Type 2 diabetes mellitus without complications: Secondary | ICD-10-CM | POA: Diagnosis not present

## 2019-02-16 DIAGNOSIS — E43 Unspecified severe protein-calorie malnutrition: Secondary | ICD-10-CM | POA: Diagnosis not present

## 2019-02-16 DIAGNOSIS — I480 Paroxysmal atrial fibrillation: Secondary | ICD-10-CM | POA: Diagnosis not present

## 2019-02-16 DIAGNOSIS — I10 Essential (primary) hypertension: Secondary | ICD-10-CM | POA: Diagnosis not present

## 2019-02-16 DIAGNOSIS — E782 Mixed hyperlipidemia: Secondary | ICD-10-CM | POA: Diagnosis not present

## 2019-02-16 DIAGNOSIS — R443 Hallucinations, unspecified: Secondary | ICD-10-CM | POA: Diagnosis not present

## 2019-02-16 DIAGNOSIS — I69391 Dysphagia following cerebral infarction: Secondary | ICD-10-CM | POA: Diagnosis not present

## 2019-02-16 DIAGNOSIS — R4182 Altered mental status, unspecified: Secondary | ICD-10-CM | POA: Diagnosis not present

## 2019-02-16 DIAGNOSIS — R1319 Other dysphagia: Secondary | ICD-10-CM | POA: Diagnosis not present

## 2019-02-22 NOTE — Progress Notes (Signed)
Keator, Cythia C. ([id number]) Visit Report for 11/16/2018 Chief Complaint Document Details Patient Name: Date of Service: Rachel Fox, Rachel Fox 11/16/2018 1:15 PM Medical Record [id number] Patient Account Number: [id number] Date of Birth/Sex: Treating RN: 1945/11/06 (73 y.o. Rachel Fox Primary Care Provider: Allyn Kenner Other Clinician: Referring Provider: Treating Provider/Extender:, Ludger Nutting, Moses Manners in Treatment: 0 Information Obtained from: Patient Chief Complaint 11/16/2018; patient is here for review of her pressure ulcer on her left buttock Electronic Signature(s) Signed: 11/16/2018 6:24:47 PM By: Linton Ham MD Entered By: Linton Ham on 11/16/2018 14:45:03 -------------------------------------------------------------------------------- HPI Details Patient Name: Date of Service: Rachel Fox 11/16/2018 1:15 PM Medical Record [id number] Patient Account Number: [id number] Date of Birth/Sex: Treating RN: [birth year] (73 y.o. Rachel Fox Primary Care Provider: Allyn Kenner Other Clinician: Referring Provider: Treating Provider/Extender:, Ludger Nutting, Moses Manners in Treatment: 0 History of Present Illness HPI Description: ADMISSION 11/16/2018 This is a previously healthy 73 year old woman who was found down at home. She was admitted to [facility] from 7/6 through 7/17 with rhabdomyolysis and was discovered by CT scan to have a large right CVA. Also noted to be in atrial fibrillation and I think discovered to be a type II diabetic. She was discharged to rehabilitation from 7/13 through 8/17. She is now in the skilled part of the [employer] system at [facility]. During the rehab stay she was noted to have an "buttock wound". Her son thinks that the wound actually may have been there since that time she was found down at home although I could not see any reference to it in the acute hospitalization. It is not clear what they are  using on this. She has a level 2 pressure relief surface on her bed at the facility she is currently in. Past medical history; type 2 diabetes, recently diagnosed with atrial fibrillation, hypertension. Electronic Signature(s) Signed: 11/16/2018 6:24:47 PM By: Linton Ham MD Entered By: Linton Ham on 11/16/2018 14:46:51 -------------------------------------------------------------------------------- Physical Exam Details Patient Name: Date of Service: Rachel Fox, Rachel Fox 11/16/2018 1:15 PM Medical Record [id number] Patient Account Number: [id number] Date of Birth/Sex: Treating RN: 1945-12-19 (73 y.o. Rachel Fox Primary Care Provider: Allyn Kenner Other Clinician: Referring Provider: Treating Provider/Extender:, Ludger Nutting, Moses Manners in Treatment: 0 Constitutional Patient is hypertensive.. Pulse regular and within target range for patient.Marland Kitchen Respirations regular, non-labored and within target range.. Temperature is normal and within the target range for the patient.Marland Kitchen Appears in no distress. Respiratory work of breathing is normal. Bilateral breath sounds are clear and equal in all lobes with no wheezes, rales or rhonchi.. Gastrointestinal (GI) Abdomen is soft and non-distended without masses or tenderness.. No liver or spleen enlargement. Genitourinary (GU) Bladder is not distended. Integumentary (Hair, Skin) No primary skin disorder is seen. Neurological Left hemiparesis compatible with her known right CVA. Notes Wound exam; the area in question is on the left buttock stage III. Fairly large but superficial wound. In the middle of this there is a divot with 2 small deeper areas. No evidence of infection. Even under illumination the surface of this wound looks fairly healthy. Tissue around the wound also looks healthy there is no evidence of cellulitis or overt infection. Electronic Signature(s) Signed: 11/16/2018 6:24:47 PM By: Linton Ham MD Entered By:  Linton Ham on 11/16/2018 14:49:12 -------------------------------------------------------------------------------- Physician Orders Details Patient Name: Date of Service: Rachel Fox, Rachel Fox 11/16/2018 1:15 PM Medical Record [id number] Patient Account Number: [id number] Date of Birth/Sex: Treating RN: Mar 09, 1946 (73 y.o. Rachel Fox Primary Care  Provider: Other Clinician: Nita SellsHALL, JOHN Referring Provider: Treating Provider/Extender:, Natasha Bence HALL, Vivianne SpenceJOHN Weeks in Treatment: 0 Verbal / Phone Orders: No Diagnosis Coding Follow-up Appointments Return Appointment in 2 weeks. Dressing Change Frequency Change dressing every day. Wound Cleansing Wound #1 Left Gluteus Clean wound with Normal Saline. Primary Wound Dressing Wound #1 Left Gluteus Calcium Alginate with Silver Secondary Dressing Wound #1 Left Gluteus Foam Border Electronic Signature(s) Signed: 11/16/2018 6:24:47 PM By: Baltazar Najjarobson,  MD Signed: 02/22/2019 3:04:22 PM By: Yevonne PaxEpps, Carrie RN Entered By: Yevonne PaxEpps, Carrie on 11/16/2018 14:36:03 -------------------------------------------------------------------------------- Problem List Details Patient Name: Date of Service: Rachel Fox, Rachel C. 11/16/2018 1:15 PM Medical Record [id number] Patient Account Number: [id number] Date of Birth/Sex: Treating RN: 1945-05-19 (73 y.o. Rachel Fox) Epps, Carrie Primary Care Provider: Nita SellsHALL, JOHN Other Clinician: Referring Provider: Treating Provider/Extender:, Natasha Bence HALL, Vivianne SpenceJOHN Weeks in Treatment: 0 Active Problems ICD-10 Evaluated Encounter Code Description Active Date Today Diagnosis [phone] Pressure ulcer of left buttock, stage 3 11/16/2018 No Yes I63.031 Cerebral infarction due to thrombosis of right carotid 11/16/2018 No Yes artery Inactive Problems Resolved Problems Electronic Signature(s) Signed: 11/16/2018 6:24:47 PM By: Baltazar Najjarobson,  MD Entered By: Baltazar Najjarobson,  on 11/16/2018  14:43:54 -------------------------------------------------------------------------------- Progress Note Details Patient Name: Date of Service: Rachel Fox, Rachel C. 11/16/2018 1:15 PM Medical Record [id number] Patient Account Number: [id number] Date of Birth/Sex: Treating RN: 1945-05-19 (73 y.o. Rachel Fox) Epps, Carrie Primary Care Provider: Nita SellsHALL, JOHN Other Clinician: Referring Provider: Treating Provider/Extender:, Natasha Bence HALL, Vivianne SpenceJOHN Weeks in Treatment: 0 Subjective Chief Complaint Information obtained from Patient 11/16/2018; patient is here for review of her pressure ulcer on her left buttock History of Present Illness (HPI) ADMISSION 11/16/2018 This is a previously healthy 73 year old woman who was found down at home. She was [facility] from 7/6 through 7/17 with rhabdomyolysis and was discovered by CT scan to have a large right CVA. Also noted to be in atrial fibrillation and I think discovered to be a type II diabetic. She was discharged to rehabilitation from 7/13 through 8/17. She is now in the skilled part of the [facility] system [facility]. During the rehab stay she was noted to have an "buttock wound". Her son thinks that the wound actually may have been there since that time she was found down at home although I could not see any reference to it in the acute hospitalization. It is not clear what they are using on this. She has a level 2 pressure relief surface on her bed at the facility she is currently in. Past medical history; type 2 diabetes, recently diagnosed with atrial fibrillation, hypertension. Patient History Information obtained from Patient. Allergies No Known Drug Allergies Family History Diabetes - Mother, Heart Disease - Father,Siblings, Hypertension - Mother, Stroke - Mother,Siblings, Thyroid Problems - Father, No family history of Cancer, Hereditary Spherocytosis, Kidney Disease, Lung Disease, Seizures, Tuberculosis. Social  History Former smoker - quit 30 years ago, Marital Status - Divorced, Alcohol Use - Never, Drug Use - No History, Caffeine Use - Never. Medical History Eyes Denies history of Cataracts, Glaucoma, Optic Neuritis Hematologic/Lymphatic Denies history of Anemia, Hemophilia, Human Immunodeficiency Virus, Lymphedema, Sickle Cell Disease Respiratory Denies history of Aspiration, Asthma, Chronic Obstructive Pulmonary Disease (COPD), Pneumothorax, Sleep Apnea, Tuberculosis Cardiovascular Patient has history of Arrhythmia - A. fib, Hypertension Denies history of Angina, Congestive Heart Failure, Coronary Artery Disease, Deep Vein Thrombosis, Hypotension, Myocardial Infarction, Peripheral Arterial Disease, Peripheral Venous Disease, Phlebitis, Vasculitis Gastrointestinal Denies history of Cirrhosis , Colitis, Crohnoos, Hepatitis A, Hepatitis B, Hepatitis C Endocrine  Patient has history of Type II Diabetes - September 18, 2018 Genitourinary Denies history of End Stage Renal Disease Immunological Denies history of Lupus Erythematosus, Raynaudoos, Scleroderma Integumentary (Skin) Denies history of History of Burn Musculoskeletal Denies history of Gout, Rheumatoid Arthritis, Osteoarthritis, Osteomyelitis Neurologic Denies history of Dementia, Neuropathy, Quadriplegia, Paraplegia, Seizure Disorder Oncologic Denies history of Received Chemotherapy, Received Radiation Psychiatric Denies history of Anorexia/bulimia, Confinement Anxiety Patient is treated with Insulin, Oral Agents. Blood sugar is tested. Hospitalization/Surgery History - CVA left side weakness 09/18/2018. Medical And Surgical History Notes Constitutional Symptoms (General Health) CVA july4, 2020 Neurologic hemiplegia left side r/t CVA Review of Systems (ROS) Constitutional Symptoms (General Health) Denies complaints or symptoms of Fatigue, Fever, Chills, Marked Weight Change. Eyes Complains or has symptoms of Glasses /  Contacts. Denies complaints or symptoms of Dry Eyes, Vision Changes. Ear/Nose/Mouth/Throat Denies complaints or symptoms of Chronic sinus problems or rhinitis. Respiratory Denies complaints or symptoms of Chronic or frequent coughs, Shortness of Breath. Cardiovascular Denies complaints or symptoms of Chest pain. Gastrointestinal Denies complaints or symptoms of Frequent diarrhea, Nausea, Vomiting. Endocrine Denies complaints or symptoms of Heat/cold intolerance. Genitourinary Denies complaints or symptoms of Frequent urination. Integumentary (Skin) Complains or has symptoms of Wounds - currently sacral region. Musculoskeletal Complains or has symptoms of Muscle Pain. Denies complaints or symptoms of Muscle Weakness. Neurologic Denies complaints or symptoms of Numbness/parasthesias. Psychiatric Denies complaints or symptoms of Claustrophobia, Suicidal. Objective Constitutional Patient is hypertensive.. Pulse regular and within target range for patient.Marland Kitchen Respirations regular, non-labored and within target range.. Temperature is normal and within the target range for the patient.Marland Kitchen Appears in no distress. Vitals Time Taken: 1:43 PM, Height: 63 in, Source: Stated, Weight: 144 lbs, Source: Stated, BMI: 25.5, Temperature: 97.8 F, Pulse: 67 bpm, Respiratory Rate: 18 breaths/min, Blood Pressure: 168/73 mmHg. Respiratory work of breathing is normal. Bilateral breath sounds are clear and equal in all lobes with no wheezes, rales or rhonchi.. Gastrointestinal (GI) Abdomen is soft and non-distended without masses or tenderness.. No liver or spleen enlargement. Genitourinary (GU) Bladder is not distended. Neurological Left hemiparesis compatible with her known right CVA. General Notes: Wound exam; the area in question is on the left buttock stage III. Fairly large but superficial wound. In the middle of this there is a divot with 2 small deeper areas. No evidence of infection. Even under  illumination the surface of this wound looks fairly healthy. Tissue around the wound also looks healthy there is no evidence of cellulitis or overt infection. Integumentary (Hair, Skin) No primary skin disorder is seen. Wound #1 status is Open. Original cause of wound was Pressure Injury. The wound is located on the Left Gluteus. The wound measures 4cm length x 4.2cm width x 1.8cm depth; 13.195cm^2 area and 23.75cm^3 volume. There is Fat Layer (Subcutaneous Tissue) Exposed exposed. There is no tunneling or undermining noted. There is a medium amount of serosanguineous drainage noted. The wound margin is distinct with the outline attached to the wound base. There is large (67-100%) red, pink granulation within the wound bed. There is a small (1-33%) amount of necrotic tissue within the wound bed including Adherent Slough. Assessment Active Problems ICD-10 Pressure ulcer of left buttock, stage 3 Cerebral infarction due to thrombosis of right carotid artery Plan Follow-up Appointments: Return Appointment in 2 weeks. Dressing Change Frequency: Change dressing every day. Wound Cleansing: Wound #1 Left Gluteus: Clean wound with Normal Saline. Primary Wound Dressing: Wound #1 Left Gluteus: Calcium Alginate with Silver Secondary Dressing: Wound #1 Left  Gluteus: Foam Border 1. Change the primary dressing to silver alginate and border foam. This already looked reasonably healthy with rims of epithelialization I did not see anything that looks like this was infected. 2. Crucial issue here is pressure relief both in bed and when she is in bed and when she is in wheelchair for rehabilitation 3. Her nutritional status looks superficially satisfactory. 4. There is apparently urinary and fecal incontinence issues here hopefully we are able to keep the wound free of contamination 5. We will see her back in 2 weeks Electronic Signature(s) Signed: 11/16/2018 6:24:47 PM By: Baltazar Najjar  MD Entered By: Baltazar Najjar on 11/16/2018 14:50:26 -------------------------------------------------------------------------------- HxROS Details Patient Name: Date of Service: Rachel Fox, Rachel Fox 11/16/2018 1:15 PM Medical Record [id number] Patient Account Number: [id number] Date of Birth/Sex: Treating RN: 19-Oct-1945 (73 y.o. Rachel Fox Primary Care Provider: Nita Sells Other Clinician: Referring Provider: Treating Provider/Extender:, Natasha Bence, Vivianne Spence in Treatment: 0 Information Obtained From Patient Constitutional Symptoms (General Health) Complaints and Symptoms: Negative for: Fatigue; Fever; Chills; Marked Weight Change Medical History: Past Medical History Notes: CVA july4, 2020 Eyes Complaints and Symptoms: Positive for: Glasses / Contacts Negative for: Dry Eyes; Vision Changes Medical History: Negative for: Cataracts; Glaucoma; Optic Neuritis Ear/Nose/Mouth/Throat Complaints and Symptoms: Negative for: Chronic sinus problems or rhinitis Respiratory Complaints and Symptoms: Negative for: Chronic or frequent coughs; Shortness of Breath Medical History: Negative for: Aspiration; Asthma; Chronic Obstructive Pulmonary Disease (COPD); Pneumothorax; Sleep Apnea; Tuberculosis Cardiovascular Complaints and Symptoms: Negative for: Chest pain Medical History: Positive for: Arrhythmia - A. fib; Hypertension Negative for: Angina; Congestive Heart Failure; Coronary Artery Disease; Deep Vein Thrombosis; Hypotension; Myocardial Infarction; Peripheral Arterial Disease; Peripheral Venous Disease; Phlebitis; Vasculitis Gastrointestinal Complaints and Symptoms: Negative for: Frequent diarrhea; Nausea; Vomiting Medical History: Negative for: Cirrhosis ; Colitis; Crohns; Hepatitis A; Hepatitis B; Hepatitis C Endocrine Complaints and Symptoms: Negative for: Heat/cold intolerance Medical History: Positive for: Type II Diabetes - September 18, 2018 Time with  diabetes: 3 months Treated with: Insulin, Oral agents Blood sugar tested every day: Yes Tested : daily Genitourinary Complaints and Symptoms: Negative for: Frequent urination Medical History: Negative for: End Stage Renal Disease Integumentary (Skin) Complaints and Symptoms: Positive for: Wounds - currently sacral region Medical History: Negative for: History of Burn Musculoskeletal Complaints and Symptoms: Positive for: Muscle Pain Negative for: Muscle Weakness Medical History: Negative for: Gout; Rheumatoid Arthritis; Osteoarthritis; Osteomyelitis Neurologic Complaints and Symptoms: Negative for: Numbness/parasthesias Medical History: Negative for: Dementia; Neuropathy; Quadriplegia; Paraplegia; Seizure Disorder Past Medical History Notes: hemiplegia left side r/t CVA Psychiatric Complaints and Symptoms: Negative for: Claustrophobia; Suicidal Medical History: Negative for: Anorexia/bulimia; Confinement Anxiety Hematologic/Lymphatic Medical History: Negative for: Anemia; Hemophilia; Human Immunodeficiency Virus; Lymphedema; Sickle Cell Disease Immunological Medical History: Negative for: Lupus Erythematosus; Raynauds; Scleroderma Oncologic Medical History: Negative for: Received Chemotherapy; Received Radiation Immunizations Pneumococcal Vaccine: Received Pneumococcal Vaccination: Yes Implantable Devices No devices added Hospitalization / Surgery History Type of Hospitalization/Surgery CVA left side weakness 09/18/2018 Family and Social History Cancer: No; Diabetes: Yes - Mother; Heart Disease: Yes - Father,Siblings; Hereditary Spherocytosis: No; Hypertension: Yes - Mother; Kidney Disease: No; Lung Disease: No; Seizures: No; Stroke: Yes - Mother,Siblings; Thyroid Problems: Yes - Father; Tuberculosis: No; Former smoker - quit 30 years ago; Marital Status - Divorced; Alcohol Use: Never; Drug Use: No History; Caffeine Use: Never; Financial Concerns: No; Food,  Clothing or Shelter Needs: No; Support System Lacking: No; Transportation Concerns: No Electronic Signature(s) Signed: 11/16/2018 6:24:47 PM By: Baltazar Najjar MD Signed: 11/16/2018  6:33:20 PM By: Shawn Stall Entered By: Shawn Stall on 11/16/2018 13:56:25 -------------------------------------------------------------------------------- SuperBill Details Patient Name: Date of Service: Sharece, Fleischhacker 11/16/2018 Medical Record [id number] Patient Account Number: [id number] Date of Birth/Sex: Treating RN: Apr 10, 1945 (73 y.o. Rachel Finner Primary Care Provider: Nita Sells Other Clinician: Referring Provider: Treating Provider/Extender:, Natasha Bence, Vivianne Spence in Treatment: 0 Diagnosis Coding ICD-10 Codes Code Description [phone] Pressure ulcer of left buttock, stage 3 I63.031 Cerebral infarction due to thrombosis of right carotid artery Facility Procedures CPT4 Code: [id number] Description: 99214 - WOUND CARE VISIT-LEV 4 EST PT Modifier: Quantity: 1 Physician Procedures CPT4 Code: [id number] Description: WC PHYS LEVEL 3 NEW PT ICD-10 Diagnosis Description L89.323 Pressure ulcer of left buttock, stage 3 I63.031 Cerebral infarction due to thrombosis of right carot Modifier: id artery Quantity: 1 Electronic Signature(s) Signed: 11/16/2018 6:24:47 PM By: Baltazar Najjar MD Signed: 02/22/2019 3:04:22 PM By: Yevonne Pax RN Entered By: Yevonne Pax on 11/16/2018 17:57:11

## 2019-02-24 ENCOUNTER — Encounter (HOSPITAL_COMMUNITY): Payer: Self-pay | Admitting: Physical Therapy

## 2019-02-24 ENCOUNTER — Other Ambulatory Visit: Payer: Self-pay

## 2019-02-24 ENCOUNTER — Ambulatory Visit (HOSPITAL_COMMUNITY): Payer: Medicare Other | Attending: Internal Medicine | Admitting: Physical Therapy

## 2019-02-24 DIAGNOSIS — R29818 Other symptoms and signs involving the nervous system: Secondary | ICD-10-CM | POA: Diagnosis not present

## 2019-02-24 DIAGNOSIS — M6281 Muscle weakness (generalized): Secondary | ICD-10-CM | POA: Diagnosis not present

## 2019-02-24 DIAGNOSIS — R2689 Other abnormalities of gait and mobility: Secondary | ICD-10-CM | POA: Diagnosis not present

## 2019-02-24 NOTE — Therapy (Signed)
[facility] [place], [place], [address] Phone: [phone]   Fax:  [phone]  Physical Therapy Evaluation  Patient Details  Name: Rachel Fox MRN: [id number] Date of Birth: 28-Jun-1945 Referring Provider (PT): Allyn Kenner   Encounter Date: 02/24/2019  PT End of Session - 02/24/19 1231    Visit Number  1    Number of Visits  16    Date for PT Re-Evaluation  04/21/19    Authorization Type  Primary: Medicare part A and B secondary: [employer]    Authorization Time Period  02/24/19-04/20/18 (complete progress note on 10th visit)    Authorization - Visit Number  1    Authorization - Number of Visits  10    PT Start Time  1116    PT Stop Time  1200    PT Time Calculation (min)  44 min    Equipment Utilized During Treatment  Gait belt    Activity Tolerance  Patient tolerated treatment well    Behavior During Therapy  WFL for tasks assessed/performed       Past Medical History:  Diagnosis Date  . Atrial fibrillation ([place])   . CVA (cerebral vascular accident) ([place])   . High cholesterol   . Hypertension   . Type 2 diabetes mellitus ([place])     Past Surgical History:  Procedure Laterality Date  . ABDOMINAL HYSTERECTOMY    . TUBAL LIGATION      There were no vitals filed for this visit.   Subjective Assessment - 02/24/19 1124    Subjective  Patient is 73 year old female presenting to physical therapy s/p CVA on 7-4- 20 and presents with spastic hemiplegia. She was very active prior to her stroke. She had home health PT where was walking with a hemi walker and also doing standing exercises which ended 2 weeks ago. She has not been walking with walker since because aid does not feel comfortable walking with her. She has been in wheelchair to get around and someone pushes her. She is afraid she is going to fall when she transfers. Her aid assists with transfers. Her main goal is to improve balance and ambulate. She requires  assist for household chores. She lives with her son who is with her in the evening and her aid during the day. She wants to be able to transfer into the shower.    Patient is accompained by:  --   Aid- Peter Congo   Pertinent History  CVA, hypertension, diabetes    Limitations  Standing;House hold activities;Walking    Patient Stated Goals  to improve balance and gait    Currently in Pain?  Yes    Pain Score  5     Pain Location  Arm    Pain Orientation  Left    Pain Frequency  Intermittent         [facility] PT Assessment - 02/24/19 0001      Assessment   Medical Diagnosis  Spastic Hemiplegia    Referring Provider (PT)  Allyn Kenner    Onset Date/Surgical Date  09/18/18    Next MD Visit  March 14, 2019    Prior Therapy  Home health PT      Restrictions   Weight Bearing Restrictions  No      Balance Screen   Has the patient fallen in the past 6 months  No    Has the patient had a decrease in activity level because  of a fear of falling?   Yes    Is the patient reluctant to leave their home because of a fear of falling?   No      Home Public house manager residence    Living Arrangements  Children;Other (Comment)   aid     Prior Function   Level of Independence  Independent      Observation/Other Assessments   Focus on Therapeutic Outcomes (FOTO)   Will assess next session      Sensation   Light Touch  Impaired Detail   Dermatomes L L2 decreased, L L3-L4 absent, L L5 "numb"   Light Touch Impaired Details  Impaired LLE;Absent LLE      Posture/Postural Control   Posture/Postural Control  Postural limitations    Postural Limitations  Rounded Shoulders;Forward head;Increased thoracic kyphosis;Weight shift right   weight shift right due to tendency to fall to left   Posture Comments  Patient demonstrates fair postural control with weight shift and head movements      ROM / Strength   AROM / PROM / Strength  AROM;PROM;Strength      AROM   Overall AROM    Deficits    Overall AROM Comments  impaired L LE knee flexion/extension ROM and L ankle DF/PF    AROM Assessment Site  Hip;Knee;Ankle    Right/Left Hip  Right;Left    Right/Left Knee  Right;Left    Left Knee Extension  8    Left Knee Flexion  25   supine     PROM   Overall PROM   Deficits    PROM Assessment Site  Knee    Right/Left Knee  Right;Left    Left Knee Extension  4    Left Knee Flexion  108      Strength   Overall Strength  Deficits    Strength Assessment Site  Hip;Knee;Ankle    Right/Left Hip  Right;Left    Right Hip Flexion  4/5    Left Hip Flexion  2+/5    Right/Left Knee  Right;Left    Right Knee Flexion  4+/5    Right Knee Extension  4/5    Left Knee Flexion  2+/5    Left Knee Extension  2+/5    Right/Left Ankle  Right;Left    Right Ankle Dorsiflexion  4+/5    Left Ankle Dorsiflexion  2/5      Flexibility   Soft Tissue Assessment /Muscle Length  yes    Hamstrings  restricted L    Quadriceps  restricted L      Transfers   Transfers  Stand Pivot Transfers;Sit to Stand;Stand to Sit    Sit to Stand  4: Min assist;3: Mod assist    Sit to Stand Details (indicate cue type and reason)  cueing for foot placement     Stand to Sit  4: Min assist    Stand to Sit Details (indicate cue type and reason)  Tactile cues for weight shifting    Stand to Sit Details  cueing for UE use    Stand Pivot Transfers  4: Min assist;3: Mod assist    Stand Pivot Transfer Details (indicate cue type and reason)  tactile cueing for weight shift, verbal cueing for sequencing    Comments  transfering from wheel chair to plinth, patient falling to L side requires assist for safety                Objective measurements  completed on examination: See above findings.              PT Education - 02/24/19 1230    Education Details  Patient educated on exam findings, initial POC and scope of PT    Person(s) Educated  Patient;Caregiver(s)    Methods  Explanation     Comprehension  Verbalized understanding       PT Short Term Goals - 02/24/19 1256      PT SHORT TERM GOAL #1   Title  Patient will be independent with HEP in order to optimze functional outcomes.    Time  4    Period  Weeks    Status  New    Target Date  03/24/19        PT Long Term Goals - 02/24/19 1257      PT LONG TERM GOAL #1   Title  Patient will be able to ambulate at least 50 feet with LRAD  to demonstrate improved ability to access the bathroom in her home.    Time  8    Period  Weeks    Status  New    Target Date  04/21/19      PT LONG TERM GOAL #2   Title  Patient will be able to perform sit to stand with supervision in order to demonstrate improved LE strength.    Time  8    Period  Weeks    Status  New    Target Date  04/21/19      PT LONG TERM GOAL #3   Title  Patient will report ability to transfer to shower chair for improved ability to perform hygenic activities.    Time  8    Period  Weeks    Status  New    Target Date  04/21/19      PT LONG TERM GOAL #4   Title  Patient will be able to complete bed mobility independently for improved ease of self care.    Time  8    Period  Weeks    Status  New    Target Date  04/21/19             Plan - 02/24/19 1251    Clinical Impression Statement  Patient is 73 year old female presenting to physical therapy s/p CVA on 7-4- 20 and presents with spastic hemiplegia affecting L - non dominant side.  She presents with deficits in LE strength, ROM, endurance, postural impairments, gait, balance, and functional mobility with ADL. She is having to modify and restrict ADL as indicated by as subjective information and objective measures which is affecting overall participation. Patient was independent prior to CVA and now requires assist with all ADL. Patient will benefit from skilled physical therapy in order to improve function and reduce impairment.    Personal Factors and Comorbidities  Age;Behavior  Pattern;Comorbidity 3+    Comorbidities  CVA, hypertension, diabetes    Examination-Activity Limitations  Bathing;Bed Mobility;Bend;Caring for Others;Carry;Dressing;Hygiene/Grooming;Lift;Locomotion Level;Sit;Squat;Stairs;Stand;Toileting;Transfers    Examination-Participation Restrictions  Cleaning;Community Activity;Laundry;Meal Prep;Shop;Volunteer;Yard Work    Conservation officer, historic buildingstability/Clinical Decision Making  Evolving/Moderate complexity    Clinical Decision Making  Moderate    Rehab Potential  Fair    PT Frequency  2x / week    PT Duration  8 weeks    PT Treatment/Interventions  ADLs/Self Care Home Management;Aquatic Therapy;Biofeedback;Cryotherapy;Iontophoresis 4mg /ml Dexamethasone;Electrical Stimulation;Moist Heat;Traction;Ultrasound;DME Instruction;Gait training;Stair training;Functional mobility training;Therapeutic activities;Therapeutic exercise;Balance training;Neuromuscular re-education;Patient/family education;Orthotic Fit/Training;Wheelchair mobility training;Manual techniques;Compression bandaging;Passive range  of motion;Dry needling;Energy conservation;Splinting;Taping;Vasopneumatic Device;Spinal Manipulations;Joint Manipulations    PT Next Visit Plan  assess ambulation with walker, begin LE stretching, strengthening exercises; initiate HEP    PT Home Exercise Plan  will initiate next session    Recommended Other Services  OT    Consulted and Agree with Plan of Care  Patient       Patient will benefit from skilled therapeutic intervention in order to improve the following deficits and impairments:  Abnormal gait, Decreased activity tolerance, Decreased balance, Decreased coordination, Decreased endurance, Decreased mobility, Decreased range of motion, Decreased skin integrity, Decreased strength, Difficulty walking, Impaired flexibility, Impaired sensation, Impaired tone, Improper body mechanics, Postural dysfunction, Pain  Visit Diagnosis: Other abnormalities of gait and mobility  Muscle  weakness (generalized)  Other symptoms and signs involving the nervous system     Problem List Patient Active Problem List   Diagnosis Date Noted  . Spastic hemiparesis ([facility])   . Labile blood glucose   . Acute pain of left shoulder   . AKI (acute kidney injury) ([facility])   . Controlled type 2 diabetes mellitus with hyperglycemia, without long-term current use of insulin ([facility])   . Dysphagia, post-stroke   . Atrial fibrillation ([facility])   . 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 Pressure injury of skin 09/21/2018  . Cerebrovascular accident (CVA) with involvement of left side of body (H[facility]) 09/20/2018  . Atrial fibrillation with RVR ([facility]) 09/20/2018  . Hypertension   . Rhabdomyolysis   . Type 2 diabetes mellitus ([facility])   . Hyperlipidemia   . Hyperbilirubinemia   . Leukocytosis   . Hypernatremia     1:10 PM, 02/24/19 Wyman Songster PT, DPT Physical Therapist at [facility]  [facility] [address] [place], [place], [address] Phone: [phone]   Fax:  [phone]  Name: Rachel Fox MRN: [id number] Date of Birth: Jan 22, 1946

## 2019-03-01 ENCOUNTER — Other Ambulatory Visit: Payer: Self-pay

## 2019-03-01 ENCOUNTER — Ambulatory Visit (HOSPITAL_COMMUNITY): Payer: Medicare Other

## 2019-03-01 ENCOUNTER — Encounter (HOSPITAL_COMMUNITY): Payer: Self-pay

## 2019-03-01 DIAGNOSIS — M6281 Muscle weakness (generalized): Secondary | ICD-10-CM | POA: Diagnosis not present

## 2019-03-01 DIAGNOSIS — R29818 Other symptoms and signs involving the nervous system: Secondary | ICD-10-CM

## 2019-03-01 DIAGNOSIS — R2689 Other abnormalities of gait and mobility: Secondary | ICD-10-CM | POA: Diagnosis not present

## 2019-03-01 NOTE — Therapy (Signed)
[facility] [address] [place], [place], [address] Phone: [phone]   Fax:  [phone]  Physical Therapy Treatment  Patient Details  Name: Rachel Fox MRN: [id number] Date of Birth: [birth year] Referring Provider (PT): Nita Sells   Encounter Date: 03/01/2019  PT End of Session - 03/01/19 0908    Visit Number  2    Number of Visits  16    Date for PT Re-Evaluation  04/21/19    Authorization Type  Primary: Medicare part A and B secondary: [employer]    Authorization Time Period  02/24/19-04/20/18 (complete progress note on 10th visit)    Authorization - Visit Number  2    Authorization - Number of Visits  10    PT Start Time  0901    PT Stop Time  0945    PT Time Calculation (min)  44 min    Equipment Utilized During Treatment  Gait belt    Activity Tolerance  Patient tolerated treatment well    Behavior During Therapy  [facility] for tasks assessed/performed       Past Medical History:  Diagnosis Date  . Atrial fibrillation ([facility])   . CVA (cerebral vascular accident) ([facility])   . High cholesterol   . Hypertension   . Type 2 diabetes mellitus ([facility])     Past Surgical History:  Procedure Laterality Date  . ABDOMINAL HYSTERECTOMY    . TUBAL LIGATION      There were no vitals filed for this visit.  Subjective Assessment - 03/01/19 0907    Subjective  Pt reports feeling normal today, nothing new since evaluation.    Patient is accompained by:  --   Aid- Malachi Bonds   Pertinent History  CVA, hypertension, diabetes    Limitations  Standing;House hold activities;Walking    Patient Stated Goals  to improve balance and gait    Currently in Pain?  No/denies         [facility] PT Assessment - 03/01/19 0001      Observation/Other Assessments   Focus on Therapeutic Outcomes (FOTO)   62% limited      Ambulation/Gait   Ambulation/Gait  Yes    Ambulation/Gait Assistance  3: Mod assist;2: Max assist    Ambulation Distance (Feet)  10 Feet     Assistive device  Hemi-walker    Gait Pattern  Step-to pattern;Decreased step length - right;Decreased stance time - left;Ataxic    Gait Comments  L arm flexion tone, ataxic L step progression, mod assist for L foot placement, L knee flexion in stance, hard push to L unable to correct with verbal and tactile cues            [facility] Adult PT Treatment/Exercise - 03/01/19 0001      Transfers   Transfers  Sit to Stand;Stand Pivot Transfers    Sit to Stand  4: Min assist;3: Mod assist    Stand to Sit  4: Min assist;3: Mod assist    Stand to Sit Details (indicate cue type and reason)  Tactile cues for weight shifting   physical placement of L foot for transfers   Stand Pivot Transfers  4: Min assist;3: Mod assist    Stand Pivot Transfer Details (indicate cue type and reason)  physical placement of L foot for transfers, cues for weight-shifting R    Comments  min assist with table elevated, poor weight-shifting to R with rising and lowering      Ambulation/Gait  Pre-Gait Activities  attempted TUG, but unable to tolerate distance at this time      Exercises   Exercises  Knee/Hip      Knee/Hip Exercises: Stretches   Active Hamstring Stretch  Left;2 reps;30 seconds    Active Hamstring Stretch Limitations  seated with leg extended, ocerpressure from therapist             PT Education - 03/01/19 0907    Education Details  Walking test, exercise technique, initiated HEP    Person(s) Educated  Patient    Methods  Explanation;Demonstration;Handout    Comprehension  Verbalized understanding;Returned demonstration       PT Short Term Goals - 03/01/19 0908      PT SHORT TERM GOAL #1   Title  Patient will be independent with HEP in order to optimze functional outcomes.    Time  4    Period  Weeks    Status  On-going    Target Date  03/24/19        PT Long Term Goals - 03/01/19 0908      PT LONG TERM GOAL #1   Title  Patient will be able to ambulate at least 50 feet with  LRAD  to demonstrate improved ability to access the bathroom in her home.    Time  8    Period  Weeks    Status  On-going      PT LONG TERM GOAL #2   Title  Patient will be able to perform sit to stand with supervision in order to demonstrate improved LE strength.    Time  8    Period  Weeks    Status  On-going      PT LONG TERM GOAL #3   Title  Patient will report ability to transfer to shower chair for improved ability to perform hygenic activities.    Time  8    Period  Weeks    Status  On-going      PT LONG TERM GOAL #4   Title  Patient will be able to complete bed mobility independently for improved ease of self care.    Time  8    Period  Weeks    Status  On-going            Plan - 03/01/19 1038    Clinical Impression Statement  Began treatment session with reviewing goals and completing FOTO with interpretation of results. Pt demonstrates strong push to L side with transfer training, requires physical L foot placement to avoid narrow BOS and verbal cues for weight-shifting into R foot to decrease push to L. Pt with improved performance with elevated table, able to perform with more equal bil foot weight-bearing, but continues to requires weight-shifting over to R side once in standing with hemiwalker in R hand. Pt requires mod-max assist with gait training and R hemiwalker. Attempted TUG, but unable to tolerate distance at this time. Pt with narrow, ataxic and spastic L foot step progression requiring assistance to place out of adduction, maintains L UE flexion tone and continues with pushing hard to L side despite verbal and tactile cues; RW follow for safety. Continue to progress as able.    Personal Factors and Comorbidities  Age;Behavior Pattern;Comorbidity 3+    Comorbidities  CVA, hypertension, diabetes    Examination-Activity Limitations  Bathing;Bed Mobility;Bend;Caring for Others;Carry;Dressing;Hygiene/Grooming;Lift;Locomotion  Level;Sit;Squat;Stairs;Stand;Toileting;Transfers    Examination-Participation Restrictions  Cleaning;Community Activity;Laundry;Meal Prep;Shop;Volunteer;Yard Work    Merchant navy officer  Evolving/Moderate complexity  Rehab Potential  Fair    PT Frequency  2x / week    PT Duration  8 weeks    PT Treatment/Interventions  ADLs/Self Care Home Management;Aquatic Therapy;Biofeedback;Cryotherapy;Iontophoresis 4mg /ml Dexamethasone;Electrical Stimulation;Moist Heat;Traction;Ultrasound;DME Instruction;Gait training;Stair training;Functional mobility training;Therapeutic activities;Therapeutic exercise;Balance training;Neuromuscular re-education;Patient/family education;Orthotic Fit/Training;Wheelchair mobility training;Manual techniques;Compression bandaging;Passive range of motion;Dry needling;Energy conservation;Splinting;Taping;Vasopneumatic Device;Spinal Manipulations;Joint Manipulations    PT Next Visit Plan  Continue transfer training with STS from elevated surfaces. Gait training with hemiwalker and WC follow. Strengthening and weight-bearing avoiding pushing to L.    PT Home Exercise Plan  12/15: seated HS stretch, seated marching with control    Consulted and Agree with Plan of Care  Patient       Patient will benefit from skilled therapeutic intervention in order to improve the following deficits and impairments:  Abnormal gait, Decreased activity tolerance, Decreased balance, Decreased coordination, Decreased endurance, Decreased mobility, Decreased range of motion, Decreased skin integrity, Decreased strength, Difficulty walking, Impaired flexibility, Impaired sensation, Impaired tone, Improper body mechanics, Postural dysfunction, Pain  Visit Diagnosis: Other abnormalities of gait and mobility  Muscle weakness (generalized)  Other symptoms and signs involving the nervous system     Problem List Patient Active Problem List   Diagnosis Date Noted  . Spastic hemiparesis  (HCC)   . Labile blood glucose   . Acute pain of left shoulder   . AKI (acute kidney injury) (HCC)   . Controlled type 2 diabetes mellitus with hyperglycemia, without long-term current use of insulin (HCC)   . Dysphagia, post-stroke   . Atrial fibrillation (HCC)   . 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 Pressure injury of skin 09/21/2018  . Cerebrovascular accident (CVA) with involvement of left side of body (HCC) 09/20/2018  . Atrial fibrillation with RVR (HCC) 09/20/2018  . Hypertension   . Rhabdomyolysis   . Type 2 diabetes mellitus (HCC)   . Hyperlipidemia   . Hyperbilirubinemia   . Leukocytosis   . Hypernatremia     Domenick Bookbinderori Zach Tietje PT, DPT 03/01/19, 10:44 AM [phone]  [facility] [address] [place], [place], [address] Phone: [phone]   Fax:  [phone]  Name: Laverda PageBarbara C Beveridge MRN: [id number] Date of Birth: 06-28-1945

## 2019-03-02 ENCOUNTER — Ambulatory Visit (HOSPITAL_COMMUNITY): Payer: Medicare Other | Admitting: Physical Therapy

## 2019-03-05 ENCOUNTER — Other Ambulatory Visit (HOSPITAL_COMMUNITY): Payer: Self-pay | Admitting: Internal Medicine

## 2019-03-05 ENCOUNTER — Ambulatory Visit (HOSPITAL_COMMUNITY)
Admission: RE | Admit: 2019-03-05 | Discharge: 2019-03-05 | Disposition: A | Discharge status: Home / Self Care | Payer: Medicare Other | Source: Ambulatory Visit | Attending: Internal Medicine | Admitting: Internal Medicine

## 2019-03-05 ENCOUNTER — Other Ambulatory Visit: Payer: Self-pay

## 2019-03-05 DIAGNOSIS — R112 Nausea with vomiting, unspecified: Secondary | ICD-10-CM | POA: Diagnosis present

## 2019-03-14 ENCOUNTER — Encounter: Payer: Self-pay | Admitting: Cardiology

## 2019-03-14 ENCOUNTER — Other Ambulatory Visit: Payer: Self-pay

## 2019-03-14 ENCOUNTER — Ambulatory Visit (INDEPENDENT_AMBULATORY_CARE_PROVIDER_SITE_OTHER): Payer: Medicare Other | Admitting: Cardiology

## 2019-03-14 VITALS — BP 136/84 | HR 88 | Temp 97.5°F | Ht 63.0 in | Wt 117.0 lb

## 2019-03-14 DIAGNOSIS — I639 Cerebral infarction, unspecified: Secondary | ICD-10-CM | POA: Diagnosis not present

## 2019-03-14 DIAGNOSIS — I480 Paroxysmal atrial fibrillation: Secondary | ICD-10-CM | POA: Diagnosis not present

## 2019-03-14 MED ORDER — DILTIAZEM HCL ER COATED BEADS 240 MG PO CP24: 240.0000 mg | ORAL_CAPSULE | Freq: Every day | ORAL | 3 refills | Status: DC

## 2019-03-14 NOTE — Progress Notes (Signed)
Clinical Summary Rachel Fox is a 73 y.o.female seen today for a focused visit on her history of afib.   1. Afib - new diagnosis during 09/2018 admission, presented with CVA - was started on anticoag during that admission once cleared by neuro - she was managed with dilt, amio,digoxin.  - at f/u digoxin was stopped.    - no recent palpitations - compliant with meds - no bleeding on eliquis.  2. CVA - admitted 09/2018 with CVA - working with outpatient PT   3. Hypokalemia - seen in ER 01/10/19 with seizure like activity, routine labs showed K 2.8 - given oral replacement daily x 5 days - she reports pcp is aware and going to repeat labs  - reports recent labs with pcp  SH: lives at home with son, has daytime aid.  Past Medical History:  Diagnosis Date  . Atrial fibrillation (HCC)   . CVA (cerebral vascular accident) (HCC)   . High cholesterol   . Hypertension   . Type 2 diabetes mellitus (HCC)      Allergies  Allergen Reactions  . Lactose Intolerance (Gi) Other (See Comments)    Causes Gas in patient.     Current Outpatient Medications  Medication Sig Dispense Refill  . acetaminophen (TYLENOL) 325 MG tablet Take 2 tablets (650 mg total) by mouth every 4 (four) hours as needed for mild pain (or temp > 37.5 C (99.5 F)).    Marland Kitchen amiodarone (PACERONE) 200 MG tablet Take 1 tablet (200 mg total) by mouth daily. 90 tablet 3  . apixaban (ELIQUIS) 5 MG TABS tablet Take 1 tablet (5 mg total) by mouth 2 (two) times daily. 60 tablet 11  . atorvastatin (LIPITOR) 80 MG tablet Take 1 tablet (80 mg total) by mouth daily at 6 PM. 30 tablet 1  . Cholecalciferol (VITAMIN D3) 50 MCG (2000 UT) TABS Take by mouth.    . diclofenac sodium (VOLTAREN) 1 % GEL Apply 2 g topically 4 (four) times daily.    Marland Kitchen diltiazem (CARDIZEM) 90 MG tablet Take 1 tablet (90 mg total) by mouth 3 (three) times daily. 90 tablet 11  . DULoxetine (CYMBALTA) 30 MG capsule Take 30 mg by mouth daily.     Marland Kitchen HYDROcodone-acetaminophen (NORCO/VICODIN) 5-325 MG tablet Take 1 tablet by mouth every 6 (six) hours as needed for moderate pain.    . mirabegron ER (MYRBETRIQ) 25 MG TB24 tablet Take 1 tablet (25 mg total) by mouth daily. 30 tablet   . polyethylene glycol (MIRALAX / GLYCOLAX) 17 g packet Take 17 g by mouth daily.    . potassium chloride SA (KLOR-CON) 20 MEQ tablet Take 2 tablets (40 mEq total) by mouth daily for 5 days. 10 tablet 0  . tizanidine (ZANAFLEX) 2 MG capsule Take 2 mg by mouth 3 (three) times daily.    Marland Kitchen topiramate (TOPAMAX) 25 MG tablet Take 1 tablet (25 mg total) by mouth 2 (two) times daily. 6 tablet 0  . traMADol (ULTRAM) 50 MG tablet Take 1 tablet (50 mg total) by mouth every 12 (twelve) hours as needed for severe pain. 6 tablet 0   No current facility-administered medications for this visit.     Past Surgical History:  Procedure Laterality Date  . ABDOMINAL HYSTERECTOMY    . TUBAL LIGATION       Allergies  Allergen Reactions  . Lactose Intolerance (Gi) Other (See Comments)    Causes Gas in patient.      Family  History  Problem Relation Age of Onset  . Stroke Mother   . Heart attack Father   . Stroke Sister   . Heart attack Sister      Social History Rachel Fox reports that she quit smoking about 30 years ago. She has never used smokeless tobacco. Rachel Fox reports no history of alcohol use.   Review of Systems CONSTITUTIONAL: No weight loss, fever, chills, weakness or fatigue.  HEENT: Eyes: No visual loss, blurred vision, double vision or yellow sclerae.No hearing loss, sneezing, congestion, runny nose or sore throat.  SKIN: No rash or itching.  CARDIOVASCULAR: per hpi RESPIRATORY: No shortness of breath, cough or sputum.  GASTROINTESTINAL: No anorexia, nausea, vomiting or diarrhea. No abdominal pain or blood.  GENITOURINARY: No burning on urination, no polyuria NEUROLOGICAL: No headache, dizziness, syncope, paralysis, ataxia, numbness or  tingling in the extremities. No change in bowel or bladder control.  MUSCULOSKELETAL: No muscle, back pain, joint pain or stiffness.  LYMPHATICS: No enlarged nodes. No history of splenectomy.  PSYCHIATRIC: No history of depression or anxiety.  ENDOCRINOLOGIC: No reports of sweating, cold or heat intolerance. No polyuria or polydipsia.  Marland Kitchen   Physical Examination Today's Vitals   03/14/19 0816  BP: 136/84  Pulse: 88  Temp: (!) 97.5 F (36.4 C)  TempSrc: Temporal  Weight: 117 lb (53.1 kg)  Height: 5\' 3"  (1.6 m)   Body mass index is 20.73 kg/m.  Gen: resting comfortably, no acute distress HEENT: no scleral icterus, pupils equal round and reactive, no palptable cervical adenopathy,  CV: RRR, no m/r/g, no jvd Resp: Clear to auscultation bilaterally GI: abdomen is soft, non-tender, non-distended, normal bowel sounds, no hepatosplenomegaly MSK: extremities are warm, no edema.  Skin: warm, no rash Psych: appropriate affect   Diagnostic Studies     Assessment and Plan  1. PAF - no recent symptoms -had been on short acting dilt because she was initailly having to crush her meds at home, now taking whole pills, convert to long acting dilt 240 mg daily.  - request labs from pcp, f/u LFTs and TSH on amiodarone.    F/u 6 months  Arnoldo Lenis, M.D.

## 2019-03-14 NOTE — Patient Instructions (Signed)
Medication Instructions:  STOP DILTIAZEM 90 MG THREE TIMES DAILY    START DILTIAZEM 240 MG ONCE DAILY   Labwork:  I WILL REQUEST LABS FROM PCP  Testing/Procedures: NONE  Follow-Up: Your physician wants you to follow-up in: 6 MONTHS .  You will receive a reminder letter in the mail two months in advance. If you don't receive a letter, please call our office to schedule the follow-up appointment.   Any Other Special Instructions Will Be Listed Below (If Applicable).     If you need a refill on your cardiac medications before your next appointment, please call your pharmacy.

## 2019-03-22 ENCOUNTER — Telehealth (HOSPITAL_COMMUNITY): Payer: Self-pay | Admitting: Physical Therapy

## 2019-03-22 ENCOUNTER — Ambulatory Visit (HOSPITAL_COMMUNITY): Payer: Medicare Other | Admitting: Physical Therapy

## 2019-03-22 NOTE — Telephone Encounter (Signed)
pt needed to cancel appt for today no reason given

## 2019-03-24 ENCOUNTER — Ambulatory Visit (HOSPITAL_COMMUNITY): Payer: Medicare Other | Attending: Internal Medicine | Admitting: Physical Therapy

## 2019-03-24 DIAGNOSIS — R29818 Other symptoms and signs involving the nervous system: Secondary | ICD-10-CM | POA: Insufficient documentation

## 2019-03-24 DIAGNOSIS — I69354 Hemiplegia and hemiparesis following cerebral infarction affecting left non-dominant side: Secondary | ICD-10-CM | POA: Insufficient documentation

## 2019-03-24 DIAGNOSIS — M6281 Muscle weakness (generalized): Secondary | ICD-10-CM | POA: Insufficient documentation

## 2019-03-24 DIAGNOSIS — R2689 Other abnormalities of gait and mobility: Secondary | ICD-10-CM | POA: Insufficient documentation

## 2019-03-25 ENCOUNTER — Telehealth (HOSPITAL_COMMUNITY): Payer: Self-pay | Admitting: Physical Therapy

## 2019-03-25 NOTE — Telephone Encounter (Signed)
pt has toom many appts this day and had to cancel

## 2019-03-30 ENCOUNTER — Encounter (HOSPITAL_COMMUNITY): Payer: Self-pay | Admitting: Physical Therapy

## 2019-03-30 ENCOUNTER — Ambulatory Visit (HOSPITAL_COMMUNITY): Payer: Medicare Other | Admitting: Physical Therapy

## 2019-03-30 ENCOUNTER — Other Ambulatory Visit: Payer: Self-pay

## 2019-03-30 DIAGNOSIS — R2689 Other abnormalities of gait and mobility: Secondary | ICD-10-CM

## 2019-03-30 DIAGNOSIS — M6281 Muscle weakness (generalized): Secondary | ICD-10-CM | POA: Diagnosis not present

## 2019-03-30 DIAGNOSIS — E860 Dehydration: Secondary | ICD-10-CM | POA: Diagnosis not present

## 2019-03-30 DIAGNOSIS — R55 Syncope and collapse: Secondary | ICD-10-CM | POA: Diagnosis not present

## 2019-03-30 DIAGNOSIS — R29818 Other symptoms and signs involving the nervous system: Secondary | ICD-10-CM | POA: Diagnosis not present

## 2019-03-30 DIAGNOSIS — I69354 Hemiplegia and hemiparesis following cerebral infarction affecting left non-dominant side: Secondary | ICD-10-CM | POA: Diagnosis not present

## 2019-03-30 NOTE — Therapy (Signed)
[place], [place], [address] Phone: [phone]   Fax:  [phone]  Physical Therapy Treatment  Patient Details  Name: Rachel Fox MRN: [id number] Date of Birth: 01-02-1946 Referring Provider (PT): Allyn Kenner   Encounter Date: 03/30/2019  PT End of Session - 03/30/19 1045    Visit Number  3    Number of Visits  16    Date for PT Re-Evaluation  04/21/19   mini reasses complete 03/30/19   Authorization Type  Primary: Medicare part A and B secondary: [employer]    Authorization Time Period  02/24/19-04/20/18 (complete progress note on 10th visit)    Authorization - Visit Number  3    Authorization - Number of Visits  10    PT Start Time  1035    PT Stop Time  1115    PT Time Calculation (min)  40 min    Equipment Utilized During Treatment  Gait belt    Activity Tolerance  Patient tolerated treatment well    Behavior During Therapy  [facility] for tasks assessed/performed       Past Medical History:  Diagnosis Date  . Atrial fibrillation ([place])   . CVA (cerebral vascular accident) ([place])   . High cholesterol   . Hypertension   . Type 2 diabetes mellitus ([place])     Past Surgical History:  Procedure Laterality Date  . ABDOMINAL HYSTERECTOMY    . TUBAL LIGATION      There were no vitals filed for this visit.  Subjective Assessment - 03/30/19 1040    Subjective  Patient returns to physical therapy after 1 month absence. Patient says she was unable to attend prior visits due to scheduling conflicts and lack of transportation. Patient says she has been working on home exercises as previously instructed and has been doing better. Patient says she has been able to do things that she could not do prior like using restroom independently. Patient denies current pain other than ongoing parasthesia in LT hand.    Patient is accompained by:  --   Aid- [facility]   Pertinent History  CVA, hypertension, diabetes    Limitations   Standing;House hold activities;Walking    Patient Stated Goals  to improve balance and gait    Currently in Pain?  No/denies                       [facility] Adult PT Treatment/Exercise - 03/30/19 0001      Transfers   Stand Pivot Transfers  4: Min Doctor, general practice Details (indicate cue type and reason)  Placement of LLE, verbal cues for weight shift      Knee/Hip Exercises: Standing   Gait Training  10 feet with hemi walker, mod A, verbal cues for sequencing     Other Standing Knee Exercises  standing balance with hemi walker, 3 rounds, increasing time up to 2' max       Knee/Hip Exercises: Seated   Long Arc Quad  Both;10 reps       Long Arc Quad Limitations  therapist assist with LLE for full extension, cues for slow lowering/ eccentric control.    Marching  Both;20 reps    Marching Limitations  verbal and tactile cues for LLE placement     Sit to Sand  5 reps;with UE support   from high mat, using hemi walker and therapist Min A  PT Education - 03/30/19 1044    Education Details  --    Person(s) Educated  --    Methods  --    Comprehension  --       PT Short Term Goals - 03/30/19 1406      PT SHORT TERM GOAL #1   Title  Patient will be independent with HEP in order to optimze functional outcomes.    Time  4    Period  Weeks    Status  On-going    Target Date  03/24/19        PT Long Term Goals - 03/30/19 1406      PT LONG TERM GOAL #1   Title  Patient will be able to ambulate at least 50 feet with LRAD  to demonstrate improved ability to access the bathroom in her home.    Baseline  Current: 10 feet with hemi walker, and Mod A    Time  8    Period  Weeks    Status  On-going      PT LONG TERM GOAL #2   Title  Patient will be able to perform sit to stand with supervision in order to demonstrate improved LE strength.    Baseline  Current: Able to perform from elevated surface with Min A and verbal cues for hand and foot  placement    Time  8    Period  Weeks    Status  On-going      PT LONG TERM GOAL #3   Title  Patient will report ability to transfer to shower chair for improved ability to perform hygenic activities.    Time  8    Period  Weeks    Status  On-going      PT LONG TERM GOAL #4   Title  Patient will be able to complete bed mobility independently for improved ease of self care.    Time  8    Period  Weeks    Status  On-going            Plan - 03/30/19 1407    Clinical Impression Statement  Mini reassess performed today. Spoke with patient about current progress toward goals, and reviewed goals. Reviewed HEP with patient, assisted patient with seated BLE strengthening exercise. Reassessed current functional mobility and transfers. Reassessed gait, performed gait training with patient. Provided cues for use of hemi walker, hand placement, proper weight shifting and sequencing of LEs. Educated patient on POC and importance of compliance with 2 x weekly attendance for progress.    Personal Factors and Comorbidities  Age;Behavior Pattern;Comorbidity 3+    Comorbidities  CVA, hypertension, diabetes    Examination-Activity Limitations  Bathing;Bed Mobility;Bend;Caring for Others;Carry;Dressing;Hygiene/Grooming;Lift;Locomotion Level;Sit;Squat;Stairs;Stand;Toileting;Transfers    Examination-Participation Restrictions  Cleaning;Community Activity;Laundry;Meal Prep;Shop;Volunteer;Yard Work    Conservation officer, historic buildings  Evolving/Moderate complexity    Rehab Potential  Fair    PT Frequency  2x / week    PT Duration  4 weeks    PT Treatment/Interventions  ADLs/Self Care Home Management;Aquatic Therapy;Biofeedback;Cryotherapy;Iontophoresis 4mg /ml Dexamethasone;Electrical Stimulation;Moist Heat;Traction;Ultrasound;DME Instruction;Gait training;Stair training;Functional mobility training;Therapeutic activities;Therapeutic exercise;Balance training;Neuromuscular re-education;Patient/family  education;Orthotic Fit/Training;Wheelchair mobility training;Manual techniques;Compression bandaging;Passive range of motion;Dry needling;Energy conservation;Splinting;Taping;Vasopneumatic Device;Spinal Manipulations;Joint Manipulations    PT Next Visit Plan  Continue transfer training with STS from elevated surfaces. Gait training with hemiwalker and WC follow. Strengthening and weight-bearing avoiding pushing to L.    PT Home Exercise Plan  12/15: seated HS stretch, seated marching with control  Consulted and Agree with Plan of Care  Patient       Patient will benefit from skilled therapeutic intervention in order to improve the following deficits and impairments:  Abnormal gait, Decreased activity tolerance, Decreased balance, Decreased coordination, Decreased endurance, Decreased mobility, Decreased range of motion, Decreased skin integrity, Decreased strength, Difficulty walking, Impaired flexibility, Impaired sensation, Impaired tone, Improper body mechanics, Postural dysfunction, Pain  Visit Diagnosis: Other abnormalities of gait and mobility  Muscle weakness (generalized)  Other symptoms and signs involving the nervous system     Problem List Patient Active Problem List   Diagnosis Date Noted  . Spastic hemiparesis (HCC)   . Labile blood glucose   . Acute pain of left shoulder   . AKI (acute kidney injury) (HCC)   . Controlled type 2 diabetes mellitus with hyperglycemia, without long-term current use of insulin (HCC)   . Dysphagia, post-stroke   . Atrial fibrillation (HCC)   . 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 Pressure injury of skin 09/21/2018  . Cerebrovascular accident (CVA) with involvement of left side of body (HCC) 09/20/2018  . Atrial fibrillation with RVR (HCC) 09/20/2018  . Hypertension   . Rhabdomyolysis   . Type 2 diabetes mellitus (HCC)   . Hyperlipidemia   . Hyperbilirubinemia   . Leukocytosis   . Hypernatremia    2:21 PM,  03/30/19 Georges Lynch PT DPT  Physical Therapist with [facility]  [facility]  [phone]   [facility] [address] [place], [place], [address] Phone: [phone]   Fax:  [phone]  Name: MADILYNE TADLOCK MRN: [id number] Date of Birth: 12/10/1945

## 2019-03-31 ENCOUNTER — Encounter (HOSPITAL_COMMUNITY): Payer: Self-pay | Admitting: Physical Therapy

## 2019-03-31 ENCOUNTER — Other Ambulatory Visit: Payer: Self-pay

## 2019-03-31 ENCOUNTER — Encounter (HOSPITAL_COMMUNITY): Payer: Self-pay | Admitting: *Deleted

## 2019-03-31 ENCOUNTER — Ambulatory Visit (HOSPITAL_COMMUNITY): Payer: Medicare Other | Admitting: Physical Therapy

## 2019-03-31 ENCOUNTER — Emergency Department (HOSPITAL_COMMUNITY)
Admission: EM | Admit: 2019-03-31 | Discharge: 2019-03-31 | Disposition: A | Discharge status: Home / Self Care | Payer: Medicare Other | Source: Home / Self Care | Attending: Emergency Medicine | Admitting: Emergency Medicine

## 2019-03-31 ENCOUNTER — Emergency Department (HOSPITAL_COMMUNITY): Payer: Medicare Other

## 2019-03-31 DIAGNOSIS — R55 Syncope and collapse: Secondary | ICD-10-CM | POA: Diagnosis not present

## 2019-03-31 DIAGNOSIS — I959 Hypotension, unspecified: Secondary | ICD-10-CM | POA: Diagnosis not present

## 2019-03-31 DIAGNOSIS — R0902 Hypoxemia: Secondary | ICD-10-CM | POA: Diagnosis not present

## 2019-03-31 DIAGNOSIS — R0989 Other specified symptoms and signs involving the circulatory and respiratory systems: Secondary | ICD-10-CM | POA: Diagnosis not present

## 2019-03-31 DIAGNOSIS — R5381 Other malaise: Secondary | ICD-10-CM | POA: Diagnosis not present

## 2019-03-31 DIAGNOSIS — G459 Transient cerebral ischemic attack, unspecified: Secondary | ICD-10-CM | POA: Diagnosis not present

## 2019-03-31 DIAGNOSIS — E860 Dehydration: Secondary | ICD-10-CM | POA: Diagnosis not present

## 2019-03-31 DIAGNOSIS — R29818 Other symptoms and signs involving the nervous system: Secondary | ICD-10-CM

## 2019-03-31 DIAGNOSIS — M6281 Muscle weakness (generalized): Secondary | ICD-10-CM

## 2019-03-31 DIAGNOSIS — R2689 Other abnormalities of gait and mobility: Secondary | ICD-10-CM

## 2019-03-31 DIAGNOSIS — Z87891 Personal history of nicotine dependence: Secondary | ICD-10-CM | POA: Insufficient documentation

## 2019-03-31 DIAGNOSIS — Z7901 Long term (current) use of anticoagulants: Secondary | ICD-10-CM | POA: Insufficient documentation

## 2019-03-31 DIAGNOSIS — Z79899 Other long term (current) drug therapy: Secondary | ICD-10-CM | POA: Insufficient documentation

## 2019-03-31 DIAGNOSIS — E119 Type 2 diabetes mellitus without complications: Secondary | ICD-10-CM | POA: Insufficient documentation

## 2019-03-31 DIAGNOSIS — I10 Essential (primary) hypertension: Secondary | ICD-10-CM | POA: Insufficient documentation

## 2019-03-31 DIAGNOSIS — Z9181 History of falling: Secondary | ICD-10-CM | POA: Diagnosis not present

## 2019-03-31 DIAGNOSIS — Z743 Need for continuous supervision: Secondary | ICD-10-CM | POA: Diagnosis not present

## 2019-03-31 LAB — BASIC METABOLIC PANEL
Anion gap: 10 (ref 5–15)
BUN: 18 mg/dL (ref 8–23)
CO2: 28 mmol/L (ref 22–32)
Calcium: 9.7 mg/dL (ref 8.9–10.3)
Chloride: 102 mmol/L (ref 98–111)
Creatinine, Ser: 0.99 mg/dL (ref 0.44–1.00)
GFR calc Af Amer: >60 mL/min (ref >60)
GFR calc non Af Amer: 57 mL/min — ABNORMAL LOW (ref >60)
Glucose, Bld: 146 mg/dL — ABNORMAL HIGH (ref 70–99)
Potassium: 3.1 mmol/L — ABNORMAL LOW (ref 3.5–5.1)
Sodium: 140 mmol/L (ref 135–145)

## 2019-03-31 LAB — CBC WITH DIFFERENTIAL/PLATELET
Abs Immature Granulocytes: 0.06 10*3/uL (ref 0.00–0.07)
Basophils Absolute: 0 10*3/uL (ref 0.0–0.1)
Basophils Relative: 0 %
Eosinophils Absolute: 0 10*3/uL (ref 0.0–0.5)
Eosinophils Relative: 0 %
HCT: 37.8 % (ref 36.0–46.0)
Hemoglobin: 11.8 g/dL — ABNORMAL LOW (ref 12.0–15.0)
Immature Granulocytes: 1 %
Lymphocytes Relative: 11 %
Lymphs Abs: 1.1 10*3/uL (ref 0.7–4.0)
MCH: 27.6 pg (ref 26.0–34.0)
MCHC: 31.2 g/dL (ref 30.0–36.0)
MCV: 88.5 fL (ref 80.0–100.0)
Monocytes Absolute: 0.6 10*3/uL (ref 0.1–1.0)
Monocytes Relative: 6 %
Neutro Abs: 8.4 10*3/uL — ABNORMAL HIGH (ref 1.7–7.7)
Neutrophils Relative %: 82 %
Platelets: 218 10*3/uL (ref 150–400)
RBC: 4.27 MIL/uL (ref 3.87–5.11)
RDW: 17.2 % — ABNORMAL HIGH (ref 11.5–15.5)
WBC: 10.3 10*3/uL (ref 4.0–10.5)
nRBC: 0 % (ref 0.0–0.2)

## 2019-03-31 LAB — TROPONIN I (HIGH SENSITIVITY)
Troponin I (High Sensitivity): 3 ng/L (ref <18)
Troponin I (High Sensitivity): 2 ng/L (ref <18)

## 2019-03-31 MED ORDER — POTASSIUM CHLORIDE CRYS ER 20 MEQ PO TBCR
40.0000 meq | EXTENDED_RELEASE_TABLET | Freq: Once | ORAL | Status: AC
  Administered 2019-03-31: 40 meq via ORAL
  Filled 2019-03-31: qty 2

## 2019-03-31 MED ORDER — SODIUM CHLORIDE 0.9 % IV BOLUS
1000.0000 mL | Freq: Once | INTRAVENOUS | Status: AC
  Administered 2019-03-31: 1000 mL via INTRAVENOUS

## 2019-03-31 NOTE — Discharge Instructions (Signed)
Your lightheadedness may be due to dehydration. Stay hydrated.  Talk to your doctor about your blood pressure medication and make sure they are appropriate for your.  Get up slowly to allow your equilibrium to stabilize. Your potassium level is low today.  Eat a banana daily for 1 week and have your blood work recheck by your doctor.  Return if you have any concerns.

## 2019-03-31 NOTE — ED Provider Notes (Signed)
This very pleasant 74 year old female presents after having a stroke recently.  She has suffered some left upper extremity weakness, left lower extremity weakness and has been working with outpatient physical therapy.  When she went today it was noted that the patient had become hypotensive with standing when she was doing standing and sitting exercises, blood pressure in the 76 systolic range.  She denies any symptoms whatsoever at this time and on my exam has a pulse of 60, strong pulses, no edema, normal mental status, normal heart and lung exam.  She appears well and is asymptomatic.  I suspect this was somewhat orthostatic.  Labs and EKG pending, EKG looks unremarkable  Anticipate discharge if other studies are reassuring as well.   EKG Interpretation  Date/Time:    Ventricular Rate:    PR Interval:    QRS Duration:   QT Interval:    QTC Calculation:   R Axis:     Text Interpretation:         Medical screening examination/treatment/procedure(s) were conducted as a shared visit with non-physician practitioner(s) and myself.  I personally evaluated the patient during the encounter.  Clinical Impression:   Final diagnoses:  None         Eber Hong, MD 03/31/19 [phone]

## 2019-03-31 NOTE — ED Provider Notes (Signed)
[facility] EMERGENCY DEPARTMENT Provider Note   CSN: [id number] Arrival date & time: 03/31/19  1500     History Chief Complaint  Patient presents with  . Near Syncope    Rachel Fox is a 74 y.o. female.  The history is provided by the patient and medical records. No language interpreter was used.     74 year old female with hx of afib, HTN, DM, recent CVA presenting for evaluation of near syncope.  Patient was diagnosed with having a stroke in July of last year status post left side deficit.  She is currently undergoing physical therapy and this is a third visit.  Earlier today, she was going through sit and stand exercise and after 3 repetition upon standing patient report feeling hot, dizzy and lightheadedness with near syncope.  She has to sit down to get herself.  At that time the physical therapist obtain a blood pressure that was low (76/52) and therefore patient was brought to the ER for further ration.  Since resting, patient report improvement of her symptoms.  Patient was found to be bradycardic on initial EKG as well.  Patient however denies having any active chest pain, abdominal pain, headache, new focal numbness or focal weakness.  Patient does endorse left-sided arm soreness from physical therapy which is not new.  She has been compliant with her medication which include beta-blocker and Eliquis.  She ate breakfast this morning but did not drink much fluid.  Patient normally unable to walk without using a walker.  Otherwise patient denies any infectious symptoms.  No associate fever, chills, runny nose sneezing or coughing or dysuria.  No active pain.  She report a month ago her blood pressure medication was changed by her PCP, states she is now taking less of her medication before per recommendation.    Past Medical History:  Diagnosis Date  . Atrial fibrillation (HCC)   . CVA (cerebral vascular accident) (HCC)   . High cholesterol   . Hypertension   . Type 2  diabetes mellitus [facility])     Patient Active Problem List   Diagnosis Date Noted  . Spastic hemiparesis (HCC)   . Labile blood glucose   . Acute pain of left shoulder   . AKI (acute kidney injury) (HCC)   . Controlled type 2 diabetes mellitus with hyperglycemia, without long-term current use of insulin (HCC)   . Dysphagia, post-stroke   . Atrial fibrillation (HCC)   . 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 Pressure injury of skin 09/21/2018  . Cerebrovascular accident (CVA) with involvement of left side of body (HCC) 09/20/2018  . Atrial fibrillation with RVR (HCC) 09/20/2018  . Hypertension   . Rhabdomyolysis   . Type 2 diabetes mellitus (HCC)   . Hyperlipidemia   . Hyperbilirubinemia   . Leukocytosis   . Hypernatremia     Past Surgical History:  Procedure Laterality Date  . ABDOMINAL HYSTERECTOMY    . TUBAL LIGATION       OB History   No obstetric history on file.     Family History  Problem Relation Age of Onset  . Stroke Mother   . Heart attack Father   . Stroke Sister   . Heart attack Sister     Social History   Tobacco Use  . Smoking status: Former Smoker    Quit date: 04/14/1988    Years since quitting: 30.9  . Smokeless tobacco: Never Used  Substance Use Topics  .  Alcohol use: Never  . Drug use: Never    Home Medications Prior to Admission medications   Medication Sig Start Date End Date Taking? Authorizing Provider  acetaminophen (TYLENOL) 325 MG tablet Take 2 tablets (650 mg total) by mouth every 4 (four) hours as needed for mild pain (or temp > 37.5 C (99.5 F)). 10/29/18   Angiulli, Lavon Paganini, PA-C  amiodarone (PACERONE) 200 MG tablet Take 1 tablet (200 mg total) by mouth daily. 12/01/18   Strader, Fransisco Hertz, PA-C  apixaban (ELIQUIS) 5 MG TABS tablet Take 1 tablet (5 mg total) by mouth 2 (two) times daily. 12/01/18 11/26/19  Strader, Fransisco Hertz, PA-C  atorvastatin (LIPITOR) 80 MG tablet Take 1 tablet (80 mg total) by mouth daily at 6  PM. 09/27/18 03/14/19  Amin, Jeanella Flattery, MD  Cholecalciferol (VITAMIN D3) 50 MCG (2000 UT) TABS Take by mouth.    [provider]  diclofenac sodium (VOLTAREN) 1 % GEL Apply 2 g topically 4 (four) times daily. 10/29/18   Angiulli, Lavon Paganini, PA-C  diltiazem (CARDIZEM CD) 240 MG 24 hr capsule Take 1 capsule (240 mg total) by mouth daily. 03/14/19 06/12/19  Arnoldo Lenis, MD  DULoxetine (CYMBALTA) 30 MG capsule Take 30 mg by mouth daily.    [provider]  HYDROcodone-acetaminophen (NORCO/VICODIN) 5-325 MG tablet Take 1 tablet by mouth every 6 (six) hours as needed for moderate pain.    [provider]  mirabegron ER (MYRBETRIQ) 25 MG TB24 tablet Take 1 tablet (25 mg total) by mouth daily. 11/01/18   Angiulli, Lavon Paganini, PA-C  polyethylene glycol (MIRALAX / GLYCOLAX) 17 g packet Take 17 g by mouth daily.    [provider]  potassium chloride SA (KLOR-CON) 20 MEQ tablet Take 2 tablets (40 mEq total) by mouth daily for 5 days. 01/10/19 03/14/19  Long, Wonda Olds, MD  tizanidine (ZANAFLEX) 2 MG capsule Take 2 mg by mouth 3 (three) times daily.    [provider]  topiramate (TOPAMAX) 25 MG tablet Take 1 tablet (25 mg total) by mouth 2 (two) times daily. 10/29/18   Angiulli, Lavon Paganini, PA-C  traMADol (ULTRAM) 50 MG tablet Take 1 tablet (50 mg total) by mouth every 12 (twelve) hours as needed for severe pain. 10/29/18   Angiulli, Lavon Paganini, PA-C    Allergies    Lactose intolerance (gi)  Review of Systems   Review of Systems  All other systems reviewed and are negative.   Physical Exam Updated Vital Signs BP (!) 110/46 (BP Location: Left Arm)   Pulse (!) 48   Temp 97.6 F (36.4 C) (Oral)   Resp 16   Ht 5\' 3"  (1.6 m)   Wt 53.1 kg   SpO2 100%   BMI 20.73 kg/m   Physical Exam Vitals and nursing note reviewed.  Constitutional:      General: She is not in acute distress.    Appearance: She is well-developed.     Comments: Patient appears to be in  no acute discomfort.  HENT:     Head: Atraumatic.  Eyes:     Conjunctiva/sclera: Conjunctivae normal.  Cardiovascular:     Rate and Rhythm: Normal rate and regular rhythm.     Pulses: Normal pulses.     Heart sounds: Normal heart sounds.  Pulmonary:     Effort: Pulmonary effort is normal.     Breath sounds: Normal breath sounds.  Abdominal:     Tenderness: There is no abdominal tenderness.  Musculoskeletal:  Cervical back: Neck supple.  Skin:    Findings: No rash.  Neurological:     Mental Status: She is alert and oriented to person, place, and time.     GCS: GCS eye subscore is 4. GCS verbal subscore is 5. GCS motor subscore is 6.     Comments: Left sided facial droop, left arm in a decorticate position, weakness in left leg compared to right.  These are chronic.     ED Results / Procedures / Treatments   Labs (all labs ordered are listed, but only abnormal results are displayed) Labs Reviewed  CBC WITH DIFFERENTIAL/PLATELET - Abnormal; Notable for the following components:      Result Value   Hemoglobin 11.8 (*)    RDW 17.2 (*)    Neutro Abs 8.4 (*)    All other components within normal limits  BASIC METABOLIC PANEL - Abnormal; Notable for the following components:   Potassium 3.1 (*)    Glucose, Bld 146 (*)    GFR calc non Af Amer 57 (*)    All other components within normal limits  TROPONIN I (HIGH SENSITIVITY)  TROPONIN I (HIGH SENSITIVITY)    EKG EKG Interpretation  Date/Time:  Thursday March 31 2019 19:20:50 EST Ventricular Rate:  58 PR Interval:    QRS Duration: 135 QT Interval:  515 QTC Calculation: 506 R Axis:   50 Text Interpretation: Sinus rhythm Nonspecific intraventricular conduction delay Nonspecific T abnrm, anterolateral leads c/w 12/2018, no sig changes Confirmed by Eber Hong ([id number]) on 03/31/2019 7:29:24 PM   Radiology DG Chest Portable 1 View  Result Date: 03/31/2019 CLINICAL DATA:  Near syncope EXAM: PORTABLE CHEST 1 VIEW  COMPARISON:  Radiograph 09/20/2018 FINDINGS: No consolidation, features of edema, pneumothorax, or effusion. Pulmonary vascularity is normally distributed. The cardiomediastinal contours are unremarkable. No acute osseous or soft tissue abnormality. IMPRESSION: No acute cardiopulmonary abnormality. Electronically Signed   By: Kreg Shropshire M.D.   On: 03/31/2019 17:24    Procedures Procedures (including critical care time)  Medications Ordered in ED Medications  potassium chloride SA (KLOR-CON) CR tablet 40 mEq (40 mEq Oral Given 03/31/19 2104)  sodium chloride 0.9 % bolus 1,000 mL (1,000 mLs Intravenous New Bag/Given 03/31/19 2100)    ED Course  I have reviewed the triage vital signs and the nursing notes.  Pertinent labs & imaging results that were available during my care of the patient were reviewed by me and considered in my medical decision making (see chart for details).    MDM Rules/Calculators/A&P                      BP 135/64   Pulse 60   Temp 98.1 F (36.7 C) (Oral)   Resp 17   Ht 5\' 3"  (1.6 m)   Wt 53.1 kg   SpO2 100%   BMI 20.73 kg/m   Final Clinical Impression(s) / ED Diagnoses Final diagnoses:  Near syncope  Dehydration    Rx / DC Orders ED Discharge Orders    None     7:28 PM Patient here for evaluation of a near syncopal episode while she was performing physical therapy earlier today.  Initially she was noted to be hypotensive with an initial blood pressure of 74/56.  Currently, repeat blood pressure is 110/46 and initially she was bradycardic with a heart rate of 48.  EKG has not been done yet, will check probably to ensure no evidence of heart block or skin ischemic changes.  Initial chest x-ray unremarkable, hemoglobin is 11.8, at baseline, normal WBC, BNP and troponin is currently pending.  Patient is resting comfortably.  Work-up initiated.  8:49 PM EKG without evidence of concerning heart block or ischemic changes.  Heart rate of 58 and when I was  in the room, heart rate is within normal limit.  Mild hypokalemia with potassium of 3.1, supplementation given.  Very mild evidence of orthostatic hypotension.  Patient given IV fluid.  9:26 PM Encourage pt to stay hydrated, have her labs recheck, eat banana daily to improves potassium intake.  Return precaution given.    Fayrene Helper, PA-C 03/31/19 2127    Eber Hong, MD 03/31/19 [phone]

## 2019-03-31 NOTE — Therapy (Signed)
[facility] [address] [place], [place], [address] Phone: [phone]   Fax:  [phone]  Physical Therapy Treatment  Patient Details  Name: Rachel Fox MRN: [id number] Date of Birth: March 09, 1946 Referring Provider (PT): Nita Sells   Encounter Date: 03/31/2019  PT End of Session - 03/31/19 1600    Visit Number  4    Number of Visits  16    Date for PT Re-Evaluation  04/21/19   mini reasses complete 03/30/19   Authorization Type  Primary: Medicare part A and B secondary: [employer]    Authorization Time Period  02/24/19-04/20/18 (complete progress note on 10th visit)    Authorization - Visit Number  4    Authorization - Number of Visits  10    PT Start Time  1347    PT Stop Time  1428    PT Time Calculation (min)  41 min    Equipment Utilized During Treatment  Gait belt    Activity Tolerance  Patient tolerated treatment well    Behavior During Therapy  WFL for tasks assessed/performed       Past Medical History:  Diagnosis Date  . Atrial fibrillation (HCC)   . CVA (cerebral vascular accident) (HCC)   . High cholesterol   . Hypertension   . Type 2 diabetes mellitus (HCC)     Past Surgical History:  Procedure Laterality Date  . ABDOMINAL HYSTERECTOMY    . TUBAL LIGATION      There were no vitals filed for this visit.  Subjective Assessment - 03/31/19 1348    Subjective  Patient states she has been doing good. She has been able to go to the bathroom by herself. She felt good after last session.    Patient is accompained by:  --   Aid- Malachi Bonds   Pertinent History  CVA, hypertension, diabetes    Limitations  Standing;House hold activities;Walking    Patient Stated Goals  to improve balance and gait    Currently in Pain?  Yes    Pain Score  5     Pain Location  Calf                       [facility] Adult PT Treatment/Exercise - 03/31/19 0001      Transfers   Transfers  Sit to Stand;Stand Pivot Transfers     Sit to Stand  4: Min assist;3: Mod assist    Sit to Stand Details (indicate cue type and reason)  cueing for sequencing    Stand to Sit  4: Min assist;3: Mod assist    Stand to Sit Details (indicate cue type and reason)  Tactile cues for weight shifting    Stand Pivot Transfers  4: Min assist    Stand Pivot Transfer Details (indicate cue type and reason)  Placement of LLE, verbal cues for weight shift      Ambulation/Gait   Ambulation/Gait  Yes    Ambulation/Gait Assistance  3: Mod assist;2: Max assist    Ambulation Distance (Feet)  40 Feet    Assistive device  Hemi-walker    Gait Pattern  Step-to pattern;Decreased step length - right;Decreased stance time - left;Ataxic    Gait Comments  L arm flexion tone, ataxic L step progression, verbal cueing  for L foot placement, L knee flexion in stance, difficulty with L foot placement, verbal cueing for sequencing of hemi walker      Knee/Hip Exercises: Standing  Gait Training  40 feet with hemi walker and verbal cues for sequencing     Other Standing Knee Exercises  standing balance with hemi walker, 2 rounds, 30 -2 min      Knee/Hip Exercises: Seated   Long Arc Quad  Both;10 reps    Long Arc Quad Limitations  therapist assist with LLE for full extension, cues for slow lowering/ eccentric control.    Other Seated Knee/Hip Exercises  Ankle PROM/AAROM LLE x 10    Marching  Both;20 reps    Marching Limitations  verbal and tactile cues for LLE placement     Sit to Sand  5 reps;with UE support    from high mat, using hemi walker and therapist Min A              PT Short Term Goals - 03/30/19 1406      PT SHORT TERM GOAL #1   Title  Patient will be independent with HEP in order to optimze functional outcomes.    Time  4    Period  Weeks    Status  On-going    Target Date  03/24/19        PT Long Term Goals - 03/30/19 1406      PT LONG TERM GOAL #1   Title  Patient will be able to ambulate at least 50 feet with LRAD  to  demonstrate improved ability to access the bathroom in her home.    Baseline  Current: 10 feet with hemi walker, and Mod A    Time  8    Period  Weeks    Status  On-going      PT LONG TERM GOAL #2   Title  Patient will be able to perform sit to stand with supervision in order to demonstrate improved LE strength.    Baseline  Current: Able to perform from elevated surface with Min A and verbal cues for hand and foot placement    Time  8    Period  Weeks    Status  On-going      PT LONG TERM GOAL #3   Title  Patient will report ability to transfer to shower chair for improved ability to perform hygenic activities.    Time  8    Period  Weeks    Status  On-going      PT LONG TERM GOAL #4   Title  Patient will be able to complete bed mobility independently for improved ease of self care.    Time  8    Period  Weeks    Status  On-going            Plan - 03/31/19 1607    Clinical Impression Statement  Patient able to transfer with assist and use of hemi-walker to standing. She is able to ambulate increased distance today with hemi-walker, assist, and frequent verbal cueing. She continues to have severly impaired LLE strength but is able to achieve quad contraction with LAQ especially during eccentric phase. She is shown how to perform at home. She is able to complete sit to stand with assist and use of hemi walker and cueing for LE positioning. Patient was just finishing sit to stand exercises and she reported she was feeling dizzy. She stated she did not check her blood pressure often so PT checked patient blood pressure and it was 76/52. Patient states she did not drink much water today but it was lower than normal. She  was given some water. Blood pressure remained the same and she continued to feel dizzy, weak, and warm. She then began to show impaired posture and decreased alertness. EMS was called by a co-worker. Patient had some bubbles and saliva being discharged from her mouth  while she was being supported. Patient transitioned to lying supine and was responding some but showed decreased levels of alertness for several minutes. She then began to increase in alertness but was not at her baseline. EMS arrived and got patient. Patient will continue to benefit from physical therapy in order to improve function.    Personal Factors and Comorbidities  Age;Behavior Pattern;Comorbidity 3+    Comorbidities  CVA, hypertension, diabetes    Examination-Activity Limitations  Bathing;Bed Mobility;Bend;Caring for Others;Carry;Dressing;Hygiene/Grooming;Lift;Locomotion Level;Sit;Squat;Stairs;Stand;Toileting;Transfers    Examination-Participation Restrictions  Cleaning;Community Activity;Laundry;Meal Prep;Shop;Volunteer;Yard Work    Conservation officer, historic buildings  Evolving/Moderate complexity    Rehab Potential  Fair    PT Frequency  2x / week    PT Duration  4 weeks    PT Treatment/Interventions  ADLs/Self Care Home Management;Aquatic Therapy;Biofeedback;Cryotherapy;Iontophoresis 4mg /ml Dexamethasone;Electrical Stimulation;Moist Heat;Traction;Ultrasound;DME Instruction;Gait training;Stair training;Functional mobility training;Therapeutic activities;Therapeutic exercise;Balance training;Neuromuscular re-education;Patient/family education;Orthotic Fit/Training;Wheelchair mobility training;Manual techniques;Compression bandaging;Passive range of motion;Dry needling;Energy conservation;Splinting;Taping;Vasopneumatic Device;Spinal Manipulations;Joint Manipulations    PT Next Visit Plan  Continue transfer training with STS from elevated surfaces. Gait training with hemiwalker and WC follow. Strengthening and weight-bearing avoiding pushing to L.    PT Home Exercise Plan  12/15: seated HS stretch, seated marching with control    Consulted and Agree with Plan of Care  Patient       Patient will benefit from skilled therapeutic intervention in order to improve the following deficits and  impairments:  Abnormal gait, Decreased activity tolerance, Decreased balance, Decreased coordination, Decreased endurance, Decreased mobility, Decreased range of motion, Decreased skin integrity, Decreased strength, Difficulty walking, Impaired flexibility, Impaired sensation, Impaired tone, Improper body mechanics, Postural dysfunction, Pain  Visit Diagnosis: Other abnormalities of gait and mobility  Muscle weakness (generalized)  Other symptoms and signs involving the nervous system     Problem List Patient Active Problem List   Diagnosis Date Noted  . Spastic hemiparesis (HCC)   . Labile blood glucose   . Acute pain of left shoulder   . AKI (acute kidney injury) (HCC)   . Controlled type 2 diabetes mellitus with hyperglycemia, without long-term current use of insulin (HCC)   . Dysphagia, post-stroke   . Atrial fibrillation (HCC)   . 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 Pressure injury of skin 09/21/2018  . Cerebrovascular accident (CVA) with involvement of left side of body (HCC) 09/20/2018  . Atrial fibrillation with RVR (HCC) 09/20/2018  . Hypertension   . Rhabdomyolysis   . Type 2 diabetes mellitus (HCC)   . Hyperlipidemia   . Hyperbilirubinemia   . Leukocytosis   . Hypernatremia     4:10 PM, 03/31/19 04/02/19 PT, DPT Physical Therapist at [facility]   [facility] [address] [place], [place], [place] Phone: [phone]   Fax:  [phone]  Name: Rachel Fox MRN: Laverda Page Date of Birth: 03-Jun-1945

## 2019-03-31 NOTE — ED Triage Notes (Signed)
Pt at OP therapy due to CVA recently, pt passed out from sitting to standing.  Pt was hypotensive 70/40, last BP 120/52.  Pt bradycardiac at 47.  RA sats 89% per EMS and pt placed on 3 L/M [place].  No IV attempted.

## 2019-03-31 NOTE — ED Triage Notes (Addendum)
Reported that pt is on eliquis and a beta blocker.  Ems got a CBG 147

## 2019-03-31 NOTE — ED Notes (Signed)
Pt denies any dizziness with sitting in a wheelchair at present.

## 2019-04-04 ENCOUNTER — Encounter (HOSPITAL_COMMUNITY): Payer: Self-pay | Admitting: Physical Therapy

## 2019-04-04 ENCOUNTER — Ambulatory Visit (HOSPITAL_COMMUNITY): Payer: Medicare Other | Admitting: Physical Therapy

## 2019-04-04 ENCOUNTER — Other Ambulatory Visit: Payer: Self-pay

## 2019-04-04 DIAGNOSIS — R2689 Other abnormalities of gait and mobility: Secondary | ICD-10-CM

## 2019-04-04 DIAGNOSIS — M6281 Muscle weakness (generalized): Secondary | ICD-10-CM

## 2019-04-04 DIAGNOSIS — R29818 Other symptoms and signs involving the nervous system: Secondary | ICD-10-CM

## 2019-04-04 NOTE — Therapy (Signed)
[place], [place], [address] Phone: [phone]   Fax:  [phone]  Physical Therapy Treatment  Patient Details  Name: Rachel Fox MRN: [id number] Date of Birth: 12-05-1945 Referring Provider (PT): Allyn Kenner   Encounter Date: 04/04/2019  PT End of Session - 04/04/19 1037    Visit Number  5    Number of Visits  16    Date for PT Re-Evaluation  04/21/19   mini reasses complete 03/30/19   Authorization Type  Primary: Medicare part A and B secondary: [employer]    Authorization Time Period  02/24/19-04/20/18 (complete progress note on 10th visit)    Authorization - Visit Number  5    Authorization - Number of Visits  10    PT Start Time  1030    PT Stop Time  1115    PT Time Calculation (min)  45 min    Equipment Utilized During Treatment  Gait belt    Activity Tolerance  Patient tolerated treatment well    Behavior During Therapy  [facility] for tasks assessed/performed       Past Medical History:  Diagnosis Date  . Atrial fibrillation ([place])   . CVA (cerebral vascular accident) ([place])   . High cholesterol   . Hypertension   . Type 2 diabetes mellitus ([place])     Past Surgical History:  Procedure Laterality Date  . ABDOMINAL HYSTERECTOMY    . TUBAL LIGATION      There were no vitals filed for this visit.  Subjective Assessment - 04/04/19 1037    Subjective  Patient says she is doing well today, feeling much better than last week. Patient says she was given fluids and discharged form hospital. Patient states she has been doing her home exercise in chair with no issues. Patient reports no pain currently other than ongoing paresthesia in LT UE.    Patient is accompained by:  --   Aid- Peter [facility]   Pertinent History  CVA, hypertension, diabetes    Limitations  Standing;House hold activities;Walking    Patient Stated Goals  to improve balance and gait    Currently in Pain?  No/denies                        [facility] Adult PT Treatment/Exercise - 04/04/19 0001      Knee/Hip Exercises: Standing   Other Standing Knee Exercises  standing balance with hemi walker, 3 rounds, up to 30 sec holds      Knee/Hip Exercises: Seated   Long Arc Quad  Both;15 reps;AAROM    Long Arc Quad Limitations  assist with Lt side, tactile cues for quad activation     Ball Squeeze  10 x 5"    Clamshell with TheraBand  Red   10x5"   Other Seated Knee/Hip Exercises  Ankle pumps, AAROM with strap on LT side     Marching  Both;20 reps    Hamstring Curl  Both;15 reps    Hamstring Limitations  red band; assist for LT     Sit to [employer]  3 sets   Min A               PT Short Term Goals - 03/30/19 1406      PT SHORT TERM GOAL #1   Title  Patient will be independent with HEP in order to optimze functional outcomes.    Time  4    Period  Weeks    Status  On-going    Target Date  03/24/19        PT Long Term Goals - 03/30/19 1406      PT LONG TERM GOAL #1   Title  Patient will be able to ambulate at least 50 feet with LRAD  to demonstrate improved ability to access the bathroom in her home.    Baseline  Current: 10 feet with hemi walker, and Mod A    Time  8    Period  Weeks    Status  On-going      PT LONG TERM GOAL #2   Title  Patient will be able to perform sit to stand with supervision in order to demonstrate improved LE strength.    Baseline  Current: Able to perform from elevated surface with Min A and verbal cues for hand and foot placement    Time  8    Period  Weeks    Status  On-going      PT LONG TERM GOAL #3   Title  Patient will report ability to transfer to shower chair for improved ability to perform hygenic activities.    Time  8    Period  Weeks    Status  On-going      PT LONG TERM GOAL #4   Title  Patient will be able to complete bed mobility independently for improved ease of self care.    Time  8    Period  Weeks    Status  On-going             Plan - 04/04/19 1541    Clinical Impression Statement  Patient tolerated session well today. Activity graded per patient tolerance. Focused more on seated strengthening exercise today to reestablish tolerance to physical activity. Patient required active assist and tactile cueing for muscle activation of quads and HS on LLE during LAQs and hamstring curls with thera band. Patient cued on eccentric control for lowering portion of exercises perform with therapist active assist on LLE. Patient educated on addition of AAROM LT ankle DF with strap at home to improve ankle mobility and strength on LT side. Patient continues to demo leftward lean with sit to stand requiring Min/ Mod A due to weakness, but was able to improve with verbal cues and practice. Patient requires frequent cueing for proper placement and repositioning of LLE during chair to mat and sit to stand transfers. Patient reports no increased pain today, but did note slight LE fatigue post session.    Personal Factors and Comorbidities  Age;Behavior Pattern;Comorbidity 3+    Comorbidities  CVA, hypertension, diabetes    Examination-Activity Limitations  Bathing;Bed Mobility;Bend;Caring for Others;Carry;Dressing;Hygiene/Grooming;Lift;Locomotion Level;Sit;Squat;Stairs;Stand;Toileting;Transfers    Examination-Participation Restrictions  Cleaning;Community Activity;Laundry;Meal Prep;Shop;Volunteer;Yard Work    Conservation officer, historic buildings  Evolving/Moderate complexity    Rehab Potential  Fair    PT Frequency  2x / week    PT Duration  4 weeks    PT Treatment/Interventions  ADLs/Self Care Home Management;Aquatic Therapy;Biofeedback;Cryotherapy;Iontophoresis 4mg /ml Dexamethasone;Electrical Stimulation;Moist Heat;Traction;Ultrasound;DME Instruction;Gait training;Stair training;Functional mobility training;Therapeutic activities;Therapeutic exercise;Balance training;Neuromuscular re-education;Patient/family education;Orthotic  Fit/Training;Wheelchair mobility training;Manual techniques;Compression bandaging;Passive range of motion;Dry needling;Energy conservation;Splinting;Taping;Vasopneumatic Device;Spinal Manipulations;Joint Manipulations    PT Next Visit Plan  Continue to progress BLE strengthening in seated, progress to standing balance and return to gait training with hemi walker as tolerated.    PT Home Exercise Plan  12/15: seated HS stretch, seated marching with control    Consulted and Agree with  Plan of Care  Patient       Patient will benefit from skilled therapeutic intervention in order to improve the following deficits and impairments:  Abnormal gait, Decreased activity tolerance, Decreased balance, Decreased coordination, Decreased endurance, Decreased mobility, Decreased range of motion, Decreased skin integrity, Decreased strength, Difficulty walking, Impaired flexibility, Impaired sensation, Impaired tone, Improper body mechanics, Postural dysfunction, Pain  Visit Diagnosis: Other abnormalities of gait and mobility  Muscle weakness (generalized)  Other symptoms and signs involving the nervous system     Problem List Patient Active Problem List   Diagnosis Date Noted  . Spastic hemiparesis (HCC)   . Labile blood glucose   . Acute pain of left shoulder   . AKI (acute kidney injury) (HCC)   . Controlled type 2 diabetes mellitus with hyperglycemia, without long-term current use of insulin (HCC)   . Dysphagia, post-stroke   . Atrial fibrillation (HCC)   . 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 Pressure injury of skin 09/21/2018  . Cerebrovascular accident (CVA) with involvement of left side of body (HCC) 09/20/2018  . Atrial fibrillation with RVR (HCC) 09/20/2018  . Hypertension   . Rhabdomyolysis   . Type 2 diabetes mellitus (HCC)   . Hyperlipidemia   . Hyperbilirubinemia   . Leukocytosis   . Hypernatremia    3:52 PM, 04/04/19 Georges Lynch PT DPT  Physical  Therapist with [facility]  [facility]  [phone]   [facility] [place] [facility] [address] [place], [place], [address] Phone: [phone]   Fax:  [phone]  Name: ANOUK CRITZER MRN: [id number] Date of Birth: Aug 25, 1945

## 2019-04-05 DIAGNOSIS — E43 Unspecified severe protein-calorie malnutrition: Secondary | ICD-10-CM | POA: Diagnosis not present

## 2019-04-05 DIAGNOSIS — I69354 Hemiplegia and hemiparesis following cerebral infarction affecting left non-dominant side: Secondary | ICD-10-CM | POA: Diagnosis not present

## 2019-04-05 DIAGNOSIS — E1165 Type 2 diabetes mellitus with hyperglycemia: Secondary | ICD-10-CM | POA: Diagnosis not present

## 2019-04-05 DIAGNOSIS — E7849 Other hyperlipidemia: Secondary | ICD-10-CM | POA: Diagnosis not present

## 2019-04-05 DIAGNOSIS — K219 Gastro-esophageal reflux disease without esophagitis: Secondary | ICD-10-CM | POA: Diagnosis not present

## 2019-04-05 DIAGNOSIS — I10 Essential (primary) hypertension: Secondary | ICD-10-CM | POA: Diagnosis not present

## 2019-04-05 DIAGNOSIS — I480 Paroxysmal atrial fibrillation: Secondary | ICD-10-CM | POA: Diagnosis not present

## 2019-04-07 ENCOUNTER — Encounter (HOSPITAL_COMMUNITY): Payer: Self-pay | Admitting: Physical Therapy

## 2019-04-07 ENCOUNTER — Ambulatory Visit (HOSPITAL_COMMUNITY): Payer: Medicare Other | Admitting: Physical Therapy

## 2019-04-07 ENCOUNTER — Other Ambulatory Visit: Payer: Self-pay

## 2019-04-07 DIAGNOSIS — R2689 Other abnormalities of gait and mobility: Secondary | ICD-10-CM | POA: Diagnosis not present

## 2019-04-07 DIAGNOSIS — M6281 Muscle weakness (generalized): Secondary | ICD-10-CM

## 2019-04-07 DIAGNOSIS — R29818 Other symptoms and signs involving the nervous system: Secondary | ICD-10-CM

## 2019-04-07 NOTE — Therapy (Signed)
[place], [place], [address] Phone: [phone]   Fax:  [phone]  Physical Therapy Treatment  Patient Details  Name: Rachel Fox MRN: [id number] Date of Birth: Sep 05, 1945 Referring Provider (PT): Allyn Kenner   Encounter Date: 04/07/2019  PT End of Session - 04/07/19 1200    Visit Number  6    Number of Visits  16    Date for PT Re-Evaluation  04/21/19   mini reasses complete 03/30/19   Authorization Type  Primary: Medicare part A and B secondary: [employer]    Authorization Time Period  02/24/19-04/20/18 (complete progress note on 10th visit)    Authorization - Visit Number  6    Authorization - Number of Visits  10    PT Start Time  1116    PT Stop Time  1155    PT Time Calculation (min)  39 min    Equipment Utilized During Treatment  Gait belt    Activity Tolerance  Patient tolerated treatment well    Behavior During Therapy  WFL for tasks assessed/performed       Past Medical History:  Diagnosis Date  . Atrial fibrillation ([place])   . CVA (cerebral vascular accident) ([place])   . High cholesterol   . Hypertension   . Type 2 diabetes mellitus ([place])     Past Surgical History:  Procedure Laterality Date  . ABDOMINAL HYSTERECTOMY    . TUBAL LIGATION      There were no vitals filed for this visit.  Subjective Assessment - 04/07/19 1116    Subjective  Patient is feeling good today. She has been trying to drink more water. It has been getting better to go to the bathroom.    Patient is accompained by:  --   Aid- Peter Congo   Pertinent History  CVA, hypertension, diabetes    Limitations  Standing;House hold activities;Walking    Patient Stated Goals  to improve balance and gait    Currently in Pain?  No/denies                       [facility] Adult PT Treatment/Exercise - 04/07/19 0001      Transfers   Transfers  Sit to Stand;Stand Pivot Transfers    Sit to Stand  4: Min assist;3: Mod  assist    Sit to Stand Details (indicate cue type and reason)  cueing for sequencing     Stand to Sit  4: Min assist;3: Mod assist    Stand to Sit Details (indicate cue type and reason)  Tactile cues for weight shifting    Stand Pivot Transfers  4: Min assist    Stand Pivot Transfer Details (indicate cue type and reason)  tactile cueing for weight shift, verbal cueing for sequencing      Ambulation/Gait   Ambulation/Gait  Yes    Ambulation/Gait Assistance  3: Mod assist    Ambulation Distance (Feet)  10 Feet    Assistive device  Hemi-walker    Gait Pattern  Step-to pattern;Decreased step length - right;Decreased stance time - left;Ataxic    Gait Comments  L arm flexion tone, ataxic L step progression, verbal cueing  for L foot placement, L knee flexion in stance, difficulty with L foot placement, verbal cueing for sequencing of hemi walker      Knee/Hip Exercises: Stretches   Active Hamstring Stretch  Left;2 reps;30 seconds    Active Hamstring Stretch Limitations  with belt      Knee/Hip Exercises: Standing   Gait Training  10 feet with hemi walker, mod A, verbal cues for sequencing     Other Standing Knee Exercises  standing balance with hemi walker, 3 rounds, up to 30 sec holds      Knee/Hip Exercises: Seated   Long Arc Quad  Both;15 reps;AAROM    Long Arc Quad Limitations  assist with Lt side, tactile cues for quad activation     Ball Squeeze  10 x 5"    Clamshell with TheraBand  Red    Other Seated Knee/Hip Exercises  Ankle pumps, AAROM with strap on LT side     Marching  Both;20 reps    Hamstring Curl  Both;15 reps    Hamstring Limitations  red band; assist for LT     Sit to [place]  5 reps   using hemi walker and min/mod A            PT Education - 04/07/19 1200    Education Details  Patient educated on HEP    Person(s) Educated  Patient    Methods  Explanation    Comprehension  Verbalized understanding       PT Short Term Goals - 03/30/19 1406      PT SHORT  TERM GOAL #1   Title  Patient will be independent with HEP in order to optimze functional outcomes.    Time  4    Period  Weeks    Status  On-going    Target Date  03/24/19        PT Long Term Goals - 03/30/19 1406      PT LONG TERM GOAL #1   Title  Patient will be able to ambulate at least 50 feet with LRAD  to demonstrate improved ability to access the bathroom in her home.    Baseline  Current: 10 feet with hemi walker, and Mod A    Time  8    Period  Weeks    Status  On-going      PT LONG TERM GOAL #2   Title  Patient will be able to perform sit to stand with supervision in order to demonstrate improved LE strength.    Baseline  Current: Able to perform from elevated surface with Min A and verbal cues for hand and foot placement    Time  8    Period  Weeks    Status  On-going      PT LONG TERM GOAL #3   Title  Patient will report ability to transfer to shower chair for improved ability to perform hygenic activities.    Time  8    Period  Weeks    Status  On-going      PT LONG TERM GOAL #4   Title  Patient will be able to complete bed mobility independently for improved ease of self care.    Time  8    Period  Weeks    Status  On-going            Plan - 04/07/19 1201    Clinical Impression Statement  Patient requires min/mod assist for transfers from chair to standing with use of hemiwalker. She requires assist for balance and safety upon standing. She tolerates all seated exercises well and fatigues quickly with ambulation. She requires some verbal cueing for sequencing with hemi walker and her body position. Frequent verbal and tactile cueing for exercises. Patient  will continue to benefit from skilled physical therapy in order to improve function and reduce impairment.    Personal Factors and Comorbidities  Age;Behavior Pattern;Comorbidity 3+    Comorbidities  CVA, hypertension, diabetes    Examination-Activity Limitations  Bathing;Bed Mobility;Bend;Caring for  Others;Carry;Dressing;Hygiene/Grooming;Lift;Locomotion Level;Sit;Squat;Stairs;Stand;Toileting;Transfers    Examination-Participation Restrictions  Cleaning;Community Activity;Laundry;Meal Prep;Shop;Volunteer;Yard Work    Conservation officer, historic buildings  Evolving/Moderate complexity    Rehab Potential  Fair    PT Frequency  2x / week    PT Duration  4 weeks    PT Treatment/Interventions  ADLs/Self Care Home Management;Aquatic Therapy;Biofeedback;Cryotherapy;Iontophoresis 4mg /ml Dexamethasone;Electrical Stimulation;Moist Heat;Traction;Ultrasound;DME Instruction;Gait training;Stair training;Functional mobility training;Therapeutic activities;Therapeutic exercise;Balance training;Neuromuscular re-education;Patient/family education;Orthotic Fit/Training;Wheelchair mobility training;Manual techniques;Compression bandaging;Passive range of motion;Dry needling;Energy conservation;Splinting;Taping;Vasopneumatic Device;Spinal Manipulations;Joint Manipulations    PT Next Visit Plan  Continue to progress BLE strengthening in seated, progress to standing balance and return to gait training with hemi walker as tolerated.    PT Home Exercise Plan  12/15: seated HS stretch, seated marching with control    Consulted and Agree with Plan of Care  Patient       Patient will benefit from skilled therapeutic intervention in order to improve the following deficits and impairments:  Abnormal gait, Decreased activity tolerance, Decreased balance, Decreased coordination, Decreased endurance, Decreased mobility, Decreased range of motion, Decreased skin integrity, Decreased strength, Difficulty walking, Impaired flexibility, Impaired sensation, Impaired tone, Improper body mechanics, Postural dysfunction, Pain  Visit Diagnosis: Other abnormalities of gait and mobility  Muscle weakness (generalized)  Other symptoms and signs involving the nervous system     Problem List Patient Active Problem List   Diagnosis Date  Noted  . Spastic hemiparesis (HCC)   . Labile blood glucose   . Acute pain of left shoulder   . AKI (acute kidney injury) (HCC)   . Controlled type 2 diabetes mellitus with hyperglycemia, without long-term current use of insulin (HCC)   . Dysphagia, post-stroke   . Atrial fibrillation (HCC)   . 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 Pressure injury of skin 09/21/2018  . Cerebrovascular accident (CVA) with involvement of left side of body (HCC) 09/20/2018  . Atrial fibrillation with RVR (HCC) 09/20/2018  . Hypertension   . Rhabdomyolysis   . Type 2 diabetes mellitus (HCC)   . Hyperlipidemia   . Hyperbilirubinemia   . Leukocytosis   . Hypernatremia     12:47 PM, 04/07/19 04/09/19 PT, DPT Physical Therapist at [facility]   [facility] [address] [place], [place], [place] Phone: [phone]   Fax:  [phone]  Name: Rachel Fox MRN: Laverda Page Date of Birth: 1945/11/12

## 2019-04-08 DIAGNOSIS — E43 Unspecified severe protein-calorie malnutrition: Secondary | ICD-10-CM | POA: Diagnosis not present

## 2019-04-08 DIAGNOSIS — K219 Gastro-esophageal reflux disease without esophagitis: Secondary | ICD-10-CM | POA: Diagnosis not present

## 2019-04-08 DIAGNOSIS — D72819 Decreased white blood cell count, unspecified: Secondary | ICD-10-CM | POA: Diagnosis not present

## 2019-04-08 DIAGNOSIS — I480 Paroxysmal atrial fibrillation: Secondary | ICD-10-CM | POA: Diagnosis not present

## 2019-04-08 DIAGNOSIS — I10 Essential (primary) hypertension: Secondary | ICD-10-CM | POA: Diagnosis not present

## 2019-04-08 DIAGNOSIS — E119 Type 2 diabetes mellitus without complications: Secondary | ICD-10-CM | POA: Diagnosis not present

## 2019-04-08 DIAGNOSIS — I69391 Dysphagia following cerebral infarction: Secondary | ICD-10-CM | POA: Diagnosis not present

## 2019-04-08 DIAGNOSIS — N390 Urinary tract infection, site not specified: Secondary | ICD-10-CM | POA: Diagnosis not present

## 2019-04-08 DIAGNOSIS — E782 Mixed hyperlipidemia: Secondary | ICD-10-CM | POA: Diagnosis not present

## 2019-04-08 DIAGNOSIS — R1312 Dysphagia, oropharyngeal phase: Secondary | ICD-10-CM | POA: Diagnosis not present

## 2019-04-08 DIAGNOSIS — R443 Hallucinations, unspecified: Secondary | ICD-10-CM | POA: Diagnosis not present

## 2019-04-08 DIAGNOSIS — Z6826 Body mass index (BMI) 26.0-26.9, adult: Secondary | ICD-10-CM | POA: Diagnosis not present

## 2019-04-11 DIAGNOSIS — N3281 Overactive bladder: Secondary | ICD-10-CM | POA: Diagnosis not present

## 2019-04-11 DIAGNOSIS — E1165 Type 2 diabetes mellitus with hyperglycemia: Secondary | ICD-10-CM | POA: Diagnosis not present

## 2019-04-11 DIAGNOSIS — R110 Nausea: Secondary | ICD-10-CM | POA: Diagnosis not present

## 2019-04-11 DIAGNOSIS — E43 Unspecified severe protein-calorie malnutrition: Secondary | ICD-10-CM | POA: Diagnosis not present

## 2019-04-11 DIAGNOSIS — K5909 Other constipation: Secondary | ICD-10-CM | POA: Diagnosis not present

## 2019-04-11 DIAGNOSIS — I480 Paroxysmal atrial fibrillation: Secondary | ICD-10-CM | POA: Diagnosis not present

## 2019-04-11 DIAGNOSIS — E782 Mixed hyperlipidemia: Secondary | ICD-10-CM | POA: Diagnosis not present

## 2019-04-11 DIAGNOSIS — I69354 Hemiplegia and hemiparesis following cerebral infarction affecting left non-dominant side: Secondary | ICD-10-CM | POA: Diagnosis not present

## 2019-04-11 DIAGNOSIS — K219 Gastro-esophageal reflux disease without esophagitis: Secondary | ICD-10-CM | POA: Diagnosis not present

## 2019-04-12 ENCOUNTER — Ambulatory Visit (HOSPITAL_COMMUNITY): Payer: Medicare Other | Admitting: Physical Therapy

## 2019-04-12 DIAGNOSIS — I10 Essential (primary) hypertension: Secondary | ICD-10-CM | POA: Diagnosis not present

## 2019-04-12 DIAGNOSIS — I69393 Ataxia following cerebral infarction: Secondary | ICD-10-CM | POA: Diagnosis not present

## 2019-04-12 DIAGNOSIS — E1159 Type 2 diabetes mellitus with other circulatory complications: Secondary | ICD-10-CM | POA: Diagnosis not present

## 2019-04-12 DIAGNOSIS — G40419 Other generalized epilepsy and epileptic syndromes, intractable, without status epilepticus: Secondary | ICD-10-CM | POA: Diagnosis not present

## 2019-04-14 ENCOUNTER — Other Ambulatory Visit: Payer: Self-pay

## 2019-04-14 ENCOUNTER — Ambulatory Visit (HOSPITAL_COMMUNITY): Payer: Medicare Other | Admitting: Physical Therapy

## 2019-04-14 ENCOUNTER — Ambulatory Visit (HOSPITAL_COMMUNITY): Payer: Medicare Other | Admitting: Occupational Therapy

## 2019-04-14 ENCOUNTER — Encounter (HOSPITAL_COMMUNITY): Payer: Self-pay | Admitting: Occupational Therapy

## 2019-04-14 ENCOUNTER — Encounter (HOSPITAL_COMMUNITY): Payer: Self-pay | Admitting: Physical Therapy

## 2019-04-14 DIAGNOSIS — M6281 Muscle weakness (generalized): Secondary | ICD-10-CM

## 2019-04-14 DIAGNOSIS — R2689 Other abnormalities of gait and mobility: Secondary | ICD-10-CM

## 2019-04-14 DIAGNOSIS — R29818 Other symptoms and signs involving the nervous system: Secondary | ICD-10-CM

## 2019-04-14 DIAGNOSIS — I69354 Hemiplegia and hemiparesis following cerebral infarction affecting left non-dominant side: Secondary | ICD-10-CM

## 2019-04-14 NOTE — Therapy (Addendum)
[facility] [address] [place], [place], [address] Phone: [phone]   Fax:  [phone]  Physical Therapy Treatment  Patient Details  Name: Rachel Fox MRN: [id number] Date of Birth: 01/08/1946 Referring Provider (PT): Nita Sells   Encounter Date: 04/14/2019  PT End of Session - 04/14/19 1046    Visit Number  7    Number of Visits  16    Date for PT Re-Evaluation  04/21/19   mini reasses complete 03/30/19   Authorization Type  Primary: Medicare part A and B secondary: [employer]    Authorization Time Period  02/24/19-04/20/18 (complete progress note on 10th visit)    Authorization - Visit Number  7    Authorization - Number of Visits  10    PT Start Time  1034    PT Stop Time  1115    PT Time Calculation (min)  41 min    Equipment Utilized During Treatment  Gait belt    Activity Tolerance  Patient tolerated treatment well    Behavior During Therapy  [facility] for tasks assessed/performed       Past Medical History:  Diagnosis Date  . Atrial fibrillation (HCC)   . CVA (cerebral vascular accident) (HCC)   . High cholesterol   . Hypertension   . Type 2 diabetes mellitus (HCC)     Past Surgical History:  Procedure Laterality Date  . ABDOMINAL HYSTERECTOMY    . TUBAL LIGATION      There were no vitals filed for this visit.  Subjective Assessment - 04/14/19 1033    Subjective Patient states she is doing good. She denies falls. She has been trying to go to the bathroom on her own more.   Patient is accompained by:  --   Aid- Malachi Bonds   Pertinent History  CVA, hypertension, diabetes    Limitations  Standing;House hold activities;Walking    Patient Stated Goals  to improve balance and gait    Currently in Pain?  Yes    Pain Location  Hand                       [facility] Adult PT Treatment/Exercise - 04/14/19 0001      Transfers   Transfers  Sit to Stand;Stand Pivot Transfers    Sit to Stand  4: Min assist;3:  Mod assist    Sit to Stand Details (indicate cue type and reason)  cueing for sequencing     Stand to Sit  4: Min assist;3: Mod assist    Stand to Sit Details (indicate cue type and reason)  Tactile cues for weight shifting    Stand Pivot Transfers  4: Min assist    Stand Pivot Transfer Details (indicate cue type and reason)  tactile cueing for weight shift, verbal cueing for sequencing      Ambulation/Gait   Ambulation/Gait  Yes    Ambulation/Gait Assistance  3: Mod assist    Ambulation/Gait Assistance Details  5 feet to bike, 5 feet from WC to mat, 10 feet gait     Ambulation Distance (Feet)  20 Feet    Assistive device  Hemi-walker    Gait Pattern  Step-to pattern;Decreased step length - right;Decreased stance time - left;Ataxic    Gait Comments  L arm flexion tone, ataxic L step progression, verbal cueing  for L foot placement, L knee flexion in stance, difficulty with L foot placement, verbal cueing for sequencing of hemi  walker      Knee/Hip Exercises: Aerobic   Recumbent Bike  6 minutes for dynamic warm up, LE ROM      Knee/Hip Exercises: Standing   Other Standing Knee Exercises  standing balance with hemi walker, 3 rounds, up to 30 sec holds      Knee/Hip Exercises: Seated   Long Arc Quad  Both;15 reps;AAROM    Long Arc Quad Limitations  assist with Lt side, tactile cues for quad activation     Ball Squeeze  10 x 5"    Clamshell with TheraBand  Red   10x5 second holds   Other Seated Knee/Hip Exercises  ankle pumps    Marching  Both;20 reps    Hamstring Curl  Both;15 reps    Hamstring Limitations  with manual resistance    Sit to Sand  5 reps             PT Education - 04/14/19 1046    Education Details  Patient educated on HEP    Person(s) Educated  Patient    Methods  Explanation    Comprehension  Verbalized understanding       PT Short Term Goals - 03/30/19 1406      PT SHORT TERM GOAL #1   Title  Patient will be independent with HEP in order to optimze  functional outcomes.    Time  4    Period  Weeks    Status  On-going    Target Date  03/24/19        PT Long Term Goals - 03/30/19 1406      PT LONG TERM GOAL #1   Title  Patient will be able to ambulate at least 50 feet with LRAD  to demonstrate improved ability to access the bathroom in her home.    Baseline  Current: 10 feet with hemi walker, and Mod A    Time  8    Period  Weeks    Status  On-going      PT LONG TERM GOAL #2   Title  Patient will be able to perform sit to stand with supervision in order to demonstrate improved LE strength.    Baseline  Current: Able to perform from elevated surface with Min A and verbal cues for hand and foot placement    Time  8    Period  Weeks    Status  On-going      PT LONG TERM GOAL #3   Title  Patient will report ability to transfer to shower chair for improved ability to perform hygenic activities.    Time  8    Period  Weeks    Status  On-going      PT LONG TERM GOAL #4   Title  Patient will be able to complete bed mobility independently for improved ease of self care.    Time  8    Period  Weeks    Status  On-going            Plan - 04/14/19 1047    Clinical Impression Statement  Patient able to ambulate several steps to bike to begin session. She is able to bike for several minutes but requires manual help for LE in pedals and during when foot slips out. She requires cueing for slow rocking to allow for improvements in knee ROM with repetition. She performs seated exercises well with verbal and tactile cueing for muscle contraction. She fatigues quickly with ambulation and  she begins to feel hot. She takes rest breaks and drinks water because she has not drank much today. She feels better with rest. Patient will continue to benefit from skilled physical therapy in order to improve function and reduce impairment.    Personal Factors and Comorbidities  Age;Behavior Pattern;Comorbidity 3+    Comorbidities  CVA, hypertension,  diabetes    Examination-Activity Limitations  Bathing;Bed Mobility;Bend;Caring for Others;Carry;Dressing;Hygiene/Grooming;Lift;Locomotion Level;Sit;Squat;Stairs;Stand;Toileting;Transfers    Examination-Participation Restrictions  Cleaning;Community Activity;Laundry;Meal Prep;Shop;Volunteer;Yard Work    [employer]  Evolving/Moderate complexity    Rehab Potential  Fair    PT Frequency  2x / week    PT Duration  4 weeks    PT Treatment/Interventions  ADLs/Self Care Home Management;Aquatic Therapy;Biofeedback;Cryotherapy;Iontophoresis 4mg /ml Dexamethasone;Electrical Stimulation;Moist Heat;Traction;Ultrasound;DME Instruction;Gait training;Stair training;Functional mobility training;Therapeutic activities;Therapeutic exercise;Balance training;Neuromuscular re-education;Patient/family education;Orthotic Fit/Training;Wheelchair mobility training;Manual techniques;Compression bandaging;Passive range of motion;Dry needling;Energy conservation;Splinting;Taping;Vasopneumatic Device;Spinal Manipulations;Joint Manipulations    PT Next Visit Plan  Continue to progress BLE strengthening in seated, progress to standing balance and return to gait training with hemi walker as tolerated.    PT Home Exercise Plan  12/15: seated HS stretch, seated marching with control    Consulted and Agree with Plan of Care  Patient       Patient will benefit from skilled therapeutic intervention in order to improve the following deficits and impairments:  Abnormal gait, Decreased activity tolerance, Decreased balance, Decreased coordination, Decreased endurance, Decreased mobility, Decreased range of motion, Decreased skin integrity, Decreased strength, Difficulty walking, Impaired flexibility, Impaired sensation, Impaired tone, Improper body mechanics, Postural dysfunction, Pain  Visit Diagnosis: Other abnormalities of gait and mobility  Muscle weakness (generalized)  Other symptoms and signs involving the  nervous system     Problem List Patient Active Problem List   Diagnosis Date Noted  . Spastic hemiparesis ([place])   . Labile blood glucose   . Acute pain of left shoulder   . AKI (acute kidney injury) ([place])   . Controlled type 2 diabetes mellitus with hyperglycemia, without long-term current use of insulin ([place])   . Dysphagia, post-stroke   . Atrial fibrillation ([place])   . 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 Pressure injury of skin 09/21/2018  . Cerebrovascular accident (CVA) with involvement of left side of body ([place]) 09/20/2018  . Atrial fibrillation with RVR ([place]) 09/20/2018  . Hypertension   . Rhabdomyolysis   . Type 2 diabetes mellitus ([place])   . Hyperlipidemia   . Hyperbilirubinemia   . Leukocytosis   . Hypernatremia     11:18 AM, 04/14/19 Mearl Latin PT, DPT Physical Therapist at [place], [place], [address] Phone: [phone]   Fax:  [phone]  Name: MACEY WURTZ MRN: [id number] Date of Birth: 21-Dec-1945

## 2019-04-14 NOTE — Therapy (Signed)
[facility] [address] [place], [place], [address] Phone: [phone]   Fax:  [phone]  Occupational Therapy Evaluation  Patient Details  Name: Rachel Fox MRN: [id number] Date of Birth: [birth year] Referring Provider (OT): Dr. Dwana Melena   Encounter Date: 04/14/2019  OT End of Session - 04/14/19 1330    Visit Number  1    Number of Visits  2    Date for OT Re-Evaluation  05/14/19    Authorization Type  1) Medicare A & B 2) [employer]    Authorization Time Period  progress note by 10th visit    Authorization - Visit Number  1    Authorization - Number of Visits  10    OT Start Time  1115    OT Stop Time  1150    OT Time Calculation (min)  35 min    Activity Tolerance  Patient tolerated treatment well    Behavior During Therapy  [facility] for tasks assessed/performed       Past Medical History:  Diagnosis Date  . Atrial fibrillation (HCC)   . CVA (cerebral vascular accident) (HCC)   . High cholesterol   . Hypertension   . Type 2 diabetes mellitus (HCC)     Past Surgical History:  Procedure Laterality Date  . ABDOMINAL HYSTERECTOMY    . TUBAL LIGATION      There were no vitals filed for this visit.  Subjective Assessment - 04/14/19 1319    Subjective   S: I can't use this arm for anything    Pertinent History  Patient is 74 year old female s/p CVA on 09-18-18 and presents with spastic hemiplegia. Pt received acute OT services, CIR, and HHOT prior to referral for OP therapy. Pt has aid and son who assist with ADLs and mobility as needed. Pt was referred to occupational therapy services by Dr. Dwana Melena.    Patient Stated Goals  To have less pain and be able to use my LUE some.    Currently in Pain?  Yes    Pain Score  5     Pain Location  Hand    Pain Orientation  Left    Pain Descriptors / Indicators  Aching;Sore;Tightness    Pain Type  Chronic pain    Pain Radiating Towards  elbow    Pain Onset  More than a month ago     Pain Frequency  Intermittent    Aggravating Factors   unsure    Pain Relieving Factors  nothing    Effect of Pain on Daily Activities  limited tolerance to movement of LUE    Multiple Pain Sites  No        [facility] OT Assessment - 04/14/19 1101      Assessment   Medical Diagnosis  LUE spastic hemiplegia s/p CVA    Referring Provider (OT)  Dr. Dwana Melena    Onset Date/Surgical Date  09/18/18    Hand Dominance  Right    Next MD Visit  06/08/2019   Dr. Gerilyn Pilgrim   Prior Therapy  Acute OT, CIR, HHOT      Precautions   Precautions  Fall      Restrictions   Weight Bearing Restrictions  No      Balance Screen   Has the patient fallen in the past 6 months  No      Prior Function   Level of Independence  Independent    Leisure  walking, reading      ADL   ADL comments  Pt is unable to use LUE for any tasks. Is currently using RUE for all tasks, aid and son assist with ADLs and mobility tasks as needed.       Written Expression   Dominant Hand  Right      Cognition   Overall Cognitive Status  Within Functional Limits for tasks assessed      Sensation   Light Touch  Impaired by gross assessment    Light Touch Impaired Details  Impaired LUE   pt able to feel light touch, dull compared to RUE     Coordination   Gross Motor Movements are Fluid and Coordinated  No    Fine Motor Movements are Fluid and Coordinated  No      Tone   Assessment Location  Left Upper Extremity      ROM / Strength   AROM / PROM / Strength  PROM      PROM   Overall PROM   Deficits    Overall PROM Comments  Pt is able to tolerate passive elbow flexion of approximately 35 degrees (from 95 to 60 degrees); shoulder flexion of 70 degrees, and wrist extension from baseline of -20 to neutral. Pt with 0 abduction, tone inhibiting motion as well as pain      LUE Tone   LUE Tone  Brunnstrom Scale    Brunnstrom Scale (LUE)  Considerable increase in muschle tone, passive movement difficult                       OT Education - 04/14/19 1328    Education Details  extensive education regarding rehab potential considering severity of LUE spasticity. Educated on speaking to MD regarding additional options including botox which MD mentioned at last visit (Dr. Merlene Laughter)    Person(s) Educated  Patient    Methods  Explanation    Comprehension  Verbalized understanding       OT Short Term Goals - 04/14/19 1443      OT SHORT TERM GOAL #1   Title  Pt will demonstrate ability to doff and donn splint for improvement in pain and left hand positioning.    Time  1    Period  Months    Status  New    Target Date  05/14/19               Plan - 04/14/19 1331    Clinical Impression Statement  A: Pt is a 74 y/o female s/p CVA on 09/18/2018 presenting with spastic hemiplegia and inability to use LUE for ADL completion. Upon assessment, LUE at Modified Ashworth Scale 3 with marked spasticity and P/ROM being difficult. LUE is positioned in adduction, IR, elbow flexion, forearm pronation, wrist flexion, and thumb adduction. Pt also experiencing pain at stretching points of wrist, elbow, and occasionally shoulder. Discussed rehab potential with pt considering minimal gains made in past 8 months with multiple rehab venues and severity of spasticity. Pt verbalized understanding of need to focus on compenstatory strategies using RUE and hemi- techniques for ADL completion. Pt reports knowledge of hemi- strategies and cannot think of any tasks she would like to work on. Ordered LUE resting hand splint for pt this session. Pt plans to call neurologist (Dr. Merlene Laughter) regarding possible botox for pain and decreasing tone.    OT Occupational Profile and History  Problem Focused Assessment - Including review of records relating to  presenting problem    Occupational performance deficits (Please refer to evaluation for details):  ADL's;IADL's;Rest and Sleep;Leisure    Body Structure / Function /  Physical Skills  ADL;Endurance;UE functional use;Fascial restriction;Pain;Flexibility;ROM;IADL;Strength;Mobility;Tone    Rehab Potential  Poor    Clinical Decision Making  Limited treatment options, no task modification necessary    Comorbidities Affecting Occupational Performance:  May have comorbidities impacting occupational performance    Modification or Assistance to Complete Evaluation   Max significant modification of tasks or assist is necessary to complete    OT Frequency  Other (comment)   1 visit   OT Duration  --   4 weeks   OT Treatment/Interventions  Passive range of motion;Splinting;Patient/family education;Self-care/ADL training    Plan  P: Schedule appt when pt's resting hand splint arrives. Follow up on calling MD re: botox and if pt is going to pursue. If so, possibly hold therapy until after botox and reassess, if not discharge pt       Patient will benefit from skilled therapeutic intervention in order to improve the following deficits and impairments:   Body Structure / Function / Physical Skills: ADL, Endurance, UE functional use, Fascial restriction, Pain, Flexibility, ROM, IADL, Strength, Mobility, Tone       Visit Diagnosis: Hemiplegia and hemiparesis following cerebral infarction affecting left non-dominant side (HCC)  Other symptoms and signs involving the nervous system    Problem List Patient Active Problem List   Diagnosis Date Noted  . Spastic hemiparesis (HCC)   . Labile blood glucose   . Acute pain of left shoulder   . AKI (acute kidney injury) (HCC)   . Controlled type 2 diabetes mellitus with hyperglycemia, without long-term current use of insulin (HCC)   . Dysphagia, post-stroke   . Atrial fibrillation (HCC)   . 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 Pressure injury of skin 09/21/2018  . Cerebrovascular accident (CVA) with involvement of left side of body (HCC) 09/20/2018  . Atrial fibrillation with RVR (HCC) 09/20/2018  .  Hypertension   . Rhabdomyolysis   . Type 2 diabetes mellitus (HCC)   . Hyperlipidemia   . Hyperbilirubinemia   . Leukocytosis   . Hypernatremia    Ezra Sites, OTR/L  [phone] 04/14/2019, 2:45 PM  [facility] [address] [place], [place], [address] Phone: [phone]   Fax:  [phone]  Name: Rachel Fox MRN: [id number] Date of Birth: 1945-07-03

## 2019-04-18 ENCOUNTER — Ambulatory Visit (HOSPITAL_COMMUNITY): Payer: Medicare Other | Attending: Internal Medicine | Admitting: Physical Therapy

## 2019-04-18 ENCOUNTER — Encounter (HOSPITAL_COMMUNITY): Payer: Self-pay | Admitting: Physical Therapy

## 2019-04-18 ENCOUNTER — Other Ambulatory Visit: Payer: Self-pay

## 2019-04-18 DIAGNOSIS — R29818 Other symptoms and signs involving the nervous system: Secondary | ICD-10-CM | POA: Diagnosis not present

## 2019-04-18 DIAGNOSIS — M6281 Muscle weakness (generalized): Secondary | ICD-10-CM | POA: Diagnosis not present

## 2019-04-18 DIAGNOSIS — I69354 Hemiplegia and hemiparesis following cerebral infarction affecting left non-dominant side: Secondary | ICD-10-CM | POA: Diagnosis not present

## 2019-04-18 DIAGNOSIS — R2689 Other abnormalities of gait and mobility: Secondary | ICD-10-CM

## 2019-04-18 NOTE — Therapy (Addendum)
[facility] [address] [place], [place], [address] Phone: [phone]   Fax:  [phone]  Physical Therapy Treatment/ Progress Note  Patient Details  Name: Rachel Rachel MRN: [id number] Date of Birth: 12/29/1945 Referring Provider (PT): Nita Sells   Encounter Date: 04/18/2019   Progress Note Reporting Period 02/24/19 to 04/18/19  See note below for Objective Data and Assessment of Progress/Goals.       PT End of Session - 04/18/19 1037    Visit Number  8    Number of Visits  16    Date for PT Re-Evaluation  04/21/19   Progress note complete 04/18/19   Authorization Type  Primary: Medicare part A and B secondary: [employer]    Authorization Time Period  02/24/19-04/20/18; 04/18/19-05/20/19    Authorization - Visit Number  7    Authorization - Number of Visits  10    PT Start Time  1032    PT Stop Time  1114    PT Time Calculation (min)  42 min    Equipment Utilized During Treatment  Gait belt    Activity Tolerance  Patient tolerated treatment well;Patient limited by fatigue    Behavior During Therapy  WFL for tasks assessed/performed       Past Medical History:  Diagnosis Date  . Atrial fibrillation (HCC)   . CVA (cerebral vascular accident) (HCC)   . High cholesterol   . Hypertension   . Type 2 diabetes mellitus (HCC)     Past Surgical History:  Procedure Laterality Date  . ABDOMINAL HYSTERECTOMY    . TUBAL LIGATION      There were no vitals filed for this visit.  Subjective Assessment - 04/18/19 1258    Subjective  Patient says she feels things have been going well with physical therapy. Patient says her aide noted she does not lean as much when she walks. Says she notes improvement with gait and transfers overall. Continues to be limited by weakness and disuse of LT arm.    Patient is accompained by:  --   Aid- Malachi Bonds   Pertinent History  CVA, hypertension, diabetes    Limitations  Standing;House hold  activities;Walking    Patient Stated Goals  to improve balance and gait    Currently in Pain?  No/denies         [facility] PT Assessment - 04/18/19 0001      Assessment   Medical Diagnosis  LUE spastic hemiplegia s/p CVA    Referring Provider (PT)  Nita Sells    Onset Date/Surgical Date  09/18/18    Hand Dominance  Right    Next MD Visit  06/08/2019    Prior Therapy  Acute OT, CIR, HHOT      Precautions   Precautions  Fall      Restrictions   Weight Bearing Restrictions  No      Prior Function   Level of Independence  Independent      Cognition   Overall Cognitive Status  Within Functional Limits for tasks assessed      Strength   Overall Strength Comments  Tested seated in wheelchair    Right Hip Flexion  4+/5    Right Hip Extension  4+/5    Right Hip ABduction  4+/5    Right Hip ADduction  4+/5    Left Hip Flexion  3-/5    Left Hip Extension  3+/5    Left Hip ABduction  3+/5  Left Hip ADduction  4-/5    Right Knee Flexion  4+/5    Right Knee Extension  4+/5    Left Knee Flexion  2+/5    Left Knee Extension  3-/5    Right Ankle Dorsiflexion  4+/5    Left Ankle Dorsiflexion  2+/5      Transfers   Transfers  Sit to Stand;Stand Pivot Transfers    Stand to Sit  3: Mod assist    Stand Pivot Transfers  4: Min assist      Ambulation/Gait   Ambulation/Gait  Yes    Ambulation/Gait Assistance  3: Mod assist    Ambulation Distance (Feet)  10 Feet    Assistive device  Hemi-walker    Gait Pattern  Step-to pattern;Decreased step length - right;Decreased stance time - left;Ataxic;Left flexed knee in stance    Ambulation Surface  Level;Indoor                   [facility] Adult PT Treatment/Exercise - 04/18/19 0001      Knee/Hip Exercises: Aerobic   Nustep  5 min EOS for conditioning and mobility    seat 7            PT Education - 04/18/19 1259    Education Details  Patient educated on reevaluaiton findings, progress to goals and POC    Person(s)  Educated  Patient    Methods  Explanation    Comprehension  Verbalized understanding       PT Short Term Goals - 04/18/19 1111      PT SHORT TERM GOAL #1   Title  Patient will be independent with HEP in order to optimze functional outcomes.    Baseline  Patient verbalized and demos compliance with seated marches, LAQ, hip adduciton squeeze    Time  4    Period  Weeks    Status  Achieved    Target Date  03/24/19        PT Long Term Goals - 04/18/19 1112      PT LONG TERM GOAL #1   Title  Patient will be able to ambulate at least 50 feet with LRAD  to demonstrate improved ability to access the bathroom in her home.    Baseline  Current: 10 feet with hemi walker, and Mod A    Time  8    Period  Weeks    Status  On-going      PT LONG TERM GOAL #2   Title  Patient will be able to perform sit to stand with supervision in order to demonstrate improved LE strength.    Baseline  Current: Able to perform from wheelchair with with Min A and verbal cues for hand and foot placement, and forward momentum    Time  8    Period  Weeks    Status  On-going      PT LONG TERM GOAL #3   Title  Patient will report ability to transfer to shower chair for improved ability to perform hygenic activities.    Baseline  Says she has not tried, does not trust doing this yet, even with assist from aid    Time  8    Period  Weeks    Status  On-going      PT LONG TERM GOAL #4   Title  Patient will be able to complete bed mobility independently for improved ease of self care.    Baseline  Patietn reports conintued  need of help from aid to assist    Time  8    Period  Weeks    Status  On-going            Plan - 04/18/19 1253    Clinical Impression Statement  Patient is making slow but steady progress to LTGs. Patient has overall improved tolerance to functional activity, but continues to be limited by weakness about LT side. Patient has made some progress in MMT, but continues to have limited  endurance, and fatigues quickly with exercise and functional mobility tasks. Patient also requires frequent cueing for arm and leg placement as well as weight shifting with transfers and gait, but does show some improvement in sequencing using hemi walker. Patient will continue to benefit form skilled therapy services to address remaining in deficits to improve LOF with ADLs and functional mobility tasks.    Personal Factors and Comorbidities  Age;Behavior Pattern;Comorbidity 3+    Comorbidities  CVA, hypertension, diabetes    Examination-Activity Limitations  Bathing;Bed Mobility;Bend;Caring for Others;Carry;Dressing;Hygiene/Grooming;Lift;Locomotion Level;Sit;Squat;Stairs;Stand;Toileting;Transfers    Examination-Participation Restrictions  Cleaning;Community Activity;Laundry;Meal Prep;Shop;Volunteer;Yard Work    [employer] officer  Evolving/Moderate complexity    Rehab Potential  Fair    PT Frequency  2x / week    PT Duration  4 weeks    PT Treatment/Interventions  ADLs/Self Care Home Management;Aquatic Therapy;Biofeedback;Cryotherapy;Iontophoresis 4mg /ml Dexamethasone;Electrical Stimulation;Moist Heat;Traction;Ultrasound;DME Instruction;Gait training;Stair training;Functional mobility training;Therapeutic activities;Therapeutic exercise;Balance training;Neuromuscular re-education;Patient/family education;Orthotic Fit/Training;Wheelchair mobility training;Manual techniques;Compression bandaging;Passive range of motion;Dry needling;Energy conservation;Splinting;Taping;Vasopneumatic Device;Spinal Manipulations;Joint Manipulations    PT Next Visit Plan  Continue to progress BLE strength and mobility as able. Progress gait with hemi walker and balance as tolerated.    PT Home Exercise Plan  12/15: seated HS stretch, seated marching with control    Consulted and Agree with Plan of Care  Patient       Patient will benefit from skilled therapeutic intervention in order to improve the  following deficits and impairments:  Abnormal gait, Decreased activity tolerance, Decreased balance, Decreased coordination, Decreased endurance, Decreased mobility, Decreased range of motion, Decreased skin integrity, Decreased strength, Difficulty walking, Impaired flexibility, Impaired sensation, Impaired tone, Improper body mechanics, Postural dysfunction, Pain  Visit Diagnosis: Other symptoms and signs involving the nervous system  Other abnormalities of gait and mobility  Muscle weakness (generalized)     Problem List Patient Active Problem List   Diagnosis Date Noted  . Spastic hemiparesis ([place])   . Labile blood glucose   . Acute pain of left shoulder   . AKI (acute kidney injury) ([place])   . Controlled type 2 diabetes mellitus with hyperglycemia, without long-term current use of insulin ([place])   . Dysphagia, post-stroke   . Atrial fibrillation ([place])   . 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 Pressure injury of skin 09/21/2018  . Cerebrovascular accident (CVA) with involvement of left side of body ([place]) 09/20/2018  . Atrial fibrillation with RVR ([place]) 09/20/2018  . Hypertension   . Rhabdomyolysis   . Type 2 diabetes mellitus ([place])   . Hyperlipidemia   . Hyperbilirubinemia   . Leukocytosis   . Hypernatremia    1:02 PM, 04/18/19 Josue Hector PT DPT  Physical Therapist with [facility]  [phone] [place] [address] [place], [place], [address] Phone: [phone]   Fax:  [phone]  Name: Rachel Rachel MRN: [id number] Date of Birth: [birth year]

## 2019-04-19 ENCOUNTER — Ambulatory Visit (HOSPITAL_COMMUNITY): Payer: Medicare Other | Admitting: Physical Therapy

## 2019-04-19 ENCOUNTER — Other Ambulatory Visit: Payer: Self-pay

## 2019-04-19 ENCOUNTER — Encounter (HOSPITAL_COMMUNITY): Payer: Self-pay | Admitting: Physical Therapy

## 2019-04-19 DIAGNOSIS — R2689 Other abnormalities of gait and mobility: Secondary | ICD-10-CM

## 2019-04-19 DIAGNOSIS — I69354 Hemiplegia and hemiparesis following cerebral infarction affecting left non-dominant side: Secondary | ICD-10-CM | POA: Diagnosis not present

## 2019-04-19 DIAGNOSIS — M6281 Muscle weakness (generalized): Secondary | ICD-10-CM | POA: Diagnosis not present

## 2019-04-19 DIAGNOSIS — R29818 Other symptoms and signs involving the nervous system: Secondary | ICD-10-CM

## 2019-04-19 NOTE — Therapy (Signed)
[facility] [address] [place], [place], [address] Phone: [phone]   Fax:  [phone]  Physical Therapy Treatment  Patient Details  Name: Rachel Fox MRN: [id number] Date of Birth: [birth year] Referring Provider (PT): Nita Sells   Encounter Date: 04/19/2019  PT End of Session - 04/19/19 0947    Visit Number  9    Number of Visits  16    Date for PT Re-Evaluation  05/20/19   Progress note complete 04/18/19   Authorization Type  Primary: Medicare part A and B secondary: [employer]    Authorization Time Period  02/24/19-04/20/18; 04/18/19-05/20/19    Authorization - Visit Number  1    Authorization - Number of Visits  10    PT Start Time  0940    PT Stop Time  1025    PT Time Calculation (min)  45 min    Equipment Utilized During Treatment  Gait belt    Activity Tolerance  Patient tolerated treatment well;Patient limited by fatigue    Behavior During Therapy  [facility] for tasks assessed/performed       Past Medical History:  Diagnosis Date  . Atrial fibrillation (HCC)   . CVA (cerebral vascular accident) (HCC)   . High cholesterol   . Hypertension   . Type 2 diabetes mellitus (HCC)     Past Surgical History:  Procedure Laterality Date  . ABDOMINAL HYSTERECTOMY    . TUBAL LIGATION      There were no vitals filed for this visit.  Subjective Assessment - 04/19/19 0948    Subjective  Patient says she is a little sore form visit yesterday. No pain other than LT arm.    Patient is accompained by:  --   Aid- Malachi Bonds   Pertinent History  CVA, hypertension, diabetes    Limitations  Standing;House hold activities;Walking    Patient Stated Goals  to improve balance and gait                       [facility] Adult PT Treatment/Exercise - 04/19/19 0001      Knee/Hip Exercises: Aerobic   Nustep  4 min EOS for conditioning and mobility       Knee/Hip Exercises: Standing   Other Standing Knee Exercises  fwd ambulation in //  bars, 4RT; sidestepping in // bars 1RT      Knee/Hip Exercises: Seated   Long Arc Quad  Both;20 reps    Ball Squeeze  10 x 5"    Clamshell with TheraBand  Red   x20   Marching  Both;20 reps    Sit to [employer]  5 reps   Mod A               PT Short Term Goals - 04/18/19 1111      PT SHORT TERM GOAL #1   Title  Patient will be independent with HEP in order to optimze functional outcomes.    Baseline  Patient verbalized and demos compliance with seated marches, LAQ, hip adduciton squeeze    Time  4    Period  Weeks    Status  Achieved    Target Date  03/24/19        PT Long Term Goals - 04/18/19 1112      PT LONG TERM GOAL #1   Title  Patient will be able to ambulate at least 50 feet with LRAD  to demonstrate improved ability to access  the bathroom in her home.    Baseline  Current: 10 feet with hemi walker, and Mod A    Time  8    Period  Weeks    Status  On-going      PT LONG TERM GOAL #2   Title  Patient will be able to perform sit to stand with supervision in order to demonstrate improved LE strength.    Baseline  Current: Able to perform from wheelchair with with Min A and verbal cues for hand and foot placement, and forward momentum    Time  8    Period  Weeks    Status  On-going      PT LONG TERM GOAL #3   Title  Patient will report ability to transfer to shower chair for improved ability to perform hygenic activities.    Baseline  Says she has not tried, does not trust doing this yet, even with assist from aid    Time  8    Period  Weeks    Status  On-going      PT LONG TERM GOAL #4   Title  Patient will be able to complete bed mobility independently for improved ease of self care.    Baseline  Patietn reports conintued need of help from aid to assist    Time  8    Period  Weeks    Status  On-going            Plan - 04/19/19 1031    Clinical Impression Statement  Patient tolerated session well today. Was able to progress ambulation distance in  // bars to 4 RT (about 30-35 feet_ before needing to rest due to fatigue, Patient did show onset of weakness, RT knee beginning to give out, requiring more therapist assist for gait in bars. Patient required frequent verbal cues for lifting LLE for ground clearance during gait. Patient required verbal and tactile cueing for stand to sit/ pivot transfer from bars to wheelchairs to get closer to chair and turn body before attempting to sit. Patient requires frequent cueing for LLE placement prior to sit to stands for stability, also therapist block to LT foot to avoid LT LE extension when standing. Patient noted increased muscle fatigue post treatment.    Personal Factors and Comorbidities  Age;Behavior Pattern;Comorbidity 3+    Comorbidities  CVA, hypertension, diabetes    Examination-Activity Limitations  Bathing;Bed Mobility;Bend;Caring for Others;Carry;Dressing;Hygiene/Grooming;Lift;Locomotion Level;Sit;Squat;Stairs;Stand;Toileting;Transfers    Examination-Participation Restrictions  Cleaning;Community Activity;Laundry;Meal Prep;Shop;Volunteer;Yard Work    [employer] officer  Evolving/Moderate complexity    Rehab Potential  Fair    PT Frequency  2x / week    PT Duration  4 weeks    PT Treatment/Interventions  ADLs/Self Care Home Management;Aquatic Therapy;Biofeedback;Cryotherapy;Iontophoresis 4mg /ml Dexamethasone;Electrical Stimulation;Moist Heat;Traction;Ultrasound;DME Instruction;Gait training;Stair training;Functional mobility training;Therapeutic activities;Therapeutic exercise;Balance training;Neuromuscular re-education;Patient/family education;Orthotic Fit/Training;Wheelchair mobility training;Manual techniques;Compression bandaging;Passive range of motion;Dry needling;Energy conservation;Splinting;Taping;Vasopneumatic Device;Spinal Manipulations;Joint Manipulations    PT Next Visit Plan  Continue to progress BLE strength and mobility as able. Progress gait with hemi walker and  balance as tolerated.    PT Home Exercise Plan  12/15: seated HS stretch, seated marching with control    Consulted and Agree with Plan of Care  Patient       Patient will benefit from skilled therapeutic intervention in order to improve the following deficits and impairments:  Abnormal gait, Decreased activity tolerance, Decreased balance, Decreased coordination, Decreased endurance, Decreased mobility, Decreased range of motion, Decreased skin integrity, Decreased strength,  Difficulty walking, Impaired flexibility, Impaired sensation, Impaired tone, Improper body mechanics, Postural dysfunction, Pain  Visit Diagnosis: Other symptoms and signs involving the nervous system  Other abnormalities of gait and mobility  Muscle weakness (generalized)     Problem List Patient Active Problem List   Diagnosis Date Noted  . Spastic hemiparesis (HCC)   . Labile blood glucose   . Acute pain of left shoulder   . AKI (acute kidney injury) (HCC)   . Controlled type 2 diabetes mellitus with hyperglycemia, without long-term current use of insulin (HCC)   . Dysphagia, post-stroke   . Atrial fibrillation (HCC)   . 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 Pressure injury of skin 09/21/2018  . Cerebrovascular accident (CVA) with involvement of left side of body (HCC) 09/20/2018  . Atrial fibrillation with RVR (HCC) 09/20/2018  . Hypertension   . Rhabdomyolysis   . Type 2 diabetes mellitus (HCC)   . Hyperlipidemia   . Hyperbilirubinemia   . Leukocytosis   . Hypernatremia    10:33 AM, 04/19/19 Georges Lynch PT DPT  Physical Therapist with [facility]  [facility]  [phone]   [facility] [address] [place], [place], [address] Phone: [phone]   Fax:  [phone]  Name: Rachel Fox MRN: [id number] Date of Birth: 1945-11-22

## 2019-04-20 ENCOUNTER — Ambulatory Visit (HOSPITAL_COMMUNITY): Payer: Medicare Other | Admitting: Physical Therapy

## 2019-04-21 ENCOUNTER — Ambulatory Visit (HOSPITAL_COMMUNITY): Payer: Medicare Other | Admitting: Physical Therapy

## 2019-04-22 DIAGNOSIS — Z111 Encounter for screening for respiratory tuberculosis: Secondary | ICD-10-CM | POA: Diagnosis not present

## 2019-04-26 ENCOUNTER — Other Ambulatory Visit: Payer: Self-pay

## 2019-04-26 ENCOUNTER — Ambulatory Visit (HOSPITAL_COMMUNITY): Payer: Medicare Other | Admitting: Physical Therapy

## 2019-04-26 ENCOUNTER — Encounter (HOSPITAL_COMMUNITY): Payer: Self-pay | Admitting: Physical Therapy

## 2019-04-26 DIAGNOSIS — R2689 Other abnormalities of gait and mobility: Secondary | ICD-10-CM | POA: Diagnosis not present

## 2019-04-26 DIAGNOSIS — M6281 Muscle weakness (generalized): Secondary | ICD-10-CM

## 2019-04-26 DIAGNOSIS — R29818 Other symptoms and signs involving the nervous system: Secondary | ICD-10-CM

## 2019-04-26 DIAGNOSIS — I69354 Hemiplegia and hemiparesis following cerebral infarction affecting left non-dominant side: Secondary | ICD-10-CM | POA: Diagnosis not present

## 2019-04-26 NOTE — Therapy (Signed)
[facility] [address] [place], [place], [address] Phone: [phone]   Fax:  [phone]  Physical Therapy Treatment  Patient Details  Name: Rachel Fox MRN: [id number] Date of Birth: 02/17/1946 Referring Provider (PT): Nita Sells   Encounter Date: 04/26/2019  PT End of Session - 04/26/19 0942    Visit Number  10    Number of Visits  16    Date for PT Re-Evaluation  05/20/19   Progress note complete 04/18/19   Authorization Type  Primary: Medicare part A and B secondary: [employer]    Authorization Time Period  02/24/19-04/20/18; 04/18/19-05/20/19    Authorization - Visit Number  2    Authorization - Number of Visits  10    PT Start Time  0940    PT Stop Time  1030    PT Time Calculation (min)  50 min    Equipment Utilized During Treatment  Gait belt    Activity Tolerance  Patient tolerated treatment well;Patient limited by fatigue    Behavior During Therapy  [facility] for tasks assessed/performed       Past Medical History:  Diagnosis Date  . Atrial fibrillation (HCC)   . CVA (cerebral vascular accident) (HCC)   . High cholesterol   . Hypertension   . Type 2 diabetes mellitus (HCC)     Past Surgical History:  Procedure Laterality Date  . ABDOMINAL HYSTERECTOMY    . TUBAL LIGATION      There were no vitals filed for this visit.  Subjective Assessment - 04/26/19 0943    Subjective  Patient says she is sore today from how she has been sleeping. Reports no pain currently other than her LT arm/ hand    Patient is accompained by:  --   Aid- Rachel Fox   Pertinent History  CVA, hypertension, diabetes    Limitations  Standing;House hold activities;Walking    Patient Stated Goals  to improve balance and gait    Currently in Pain?  No/denies                       [facility] Adult PT Treatment/Exercise - 04/26/19 0001      Knee/Hip Exercises: Aerobic   Nustep  4 min EOS for conditioning and mobility       Knee/Hip  Exercises: Standing   Gait Training  30 feet and 12 feet with 1 rest break using hemi walker, min gaurd assist and WC follow.     Other Standing Knee Exercises  staggered stance 2 x 30" each, min gard for balance and using hemi walker on RT       Knee/Hip Exercises: Seated   Long Arc Quad  Both;20 reps    Marching  Both;20 reps    Sit to [employer]  5 reps;with UE support               PT Short Term Goals - 04/18/19 1111      PT SHORT TERM GOAL #1   Title  Patient will be independent with HEP in order to optimze functional outcomes.    Baseline  Patient verbalized and demos compliance with seated marches, LAQ, hip adduciton squeeze    Time  4    Period  Weeks    Status  Achieved    Target Date  03/24/19        PT Long Term Goals - 04/18/19 1112      PT LONG TERM GOAL #  1   Title  Patient will be able to ambulate at least 50 feet with LRAD  to demonstrate improved ability to access the bathroom in her home.    Baseline  Current: 10 feet with hemi walker, and Mod A    Time  8    Period  Weeks    Status  On-going      PT LONG TERM GOAL #2   Title  Patient will be able to perform sit to stand with supervision in order to demonstrate improved LE strength.    Baseline  Current: Able to perform from wheelchair with with Min A and verbal cues for hand and foot placement, and forward momentum    Time  8    Period  Weeks    Status  On-going      PT LONG TERM GOAL #3   Title  Patient will report ability to transfer to shower chair for improved ability to perform hygenic activities.    Baseline  Says she has not tried, does not trust doing this yet, even with assist from aid    Time  8    Period  Weeks    Status  On-going      PT LONG TERM GOAL #4   Title  Patient will be able to complete bed mobility independently for improved ease of self care.    Baseline  Patietn reports conintued need of help from aid to assist    Time  8    Period  Weeks    Status  On-going             Plan - 04/26/19 1027    Clinical Impression Statement  Patient making good progress to goals. Able to ambulate father today, but still limited by fatigue. Tends to lean LT when fatigued. Walked 30 feel with hemi walker, min gaurd assist and WC follow, demoed LOB x 3 upon fatigue, corrected with therapist assist. Patient still requires max verbal cues for LT foot placement with transfers and standing balance. Patient did well with added staggered standing balance today, requiring intermittent HHA on hemi walker and mod verbal and tactile cues.    Personal Factors and Comorbidities  Age;Behavior Pattern;Comorbidity 3+    Comorbidities  CVA, hypertension, diabetes    Examination-Activity Limitations  Bathing;Bed Mobility;Bend;Caring for Others;Carry;Dressing;Hygiene/Grooming;Lift;Locomotion Level;Sit;Squat;Stairs;Stand;Toileting;Transfers    Examination-Participation Restrictions  Cleaning;Community Activity;Laundry;Meal Prep;Shop;Volunteer;Yard Work    Merchant navy officer  Evolving/Moderate complexity    Rehab Potential  Fair    PT Frequency  2x / week    PT Duration  4 weeks    PT Treatment/Interventions  ADLs/Self Care Home Management;Aquatic Therapy;Biofeedback;Cryotherapy;Iontophoresis 4mg /ml Dexamethasone;Electrical Stimulation;Moist Heat;Traction;Ultrasound;DME Instruction;Gait training;Stair training;Functional mobility training;Therapeutic activities;Therapeutic exercise;Balance training;Neuromuscular re-education;Patient/family education;Orthotic Fit/Training;Wheelchair mobility training;Manual techniques;Compression bandaging;Passive range of motion;Dry needling;Energy conservation;Splinting;Taping;Vasopneumatic Device;Spinal Manipulations;Joint Manipulations    PT Next Visit Plan  Continue to progress BLE strength and mobility as able. Progress gait with hemi walker and balance as tolerated.    PT Home Exercise Plan  12/15: seated HS stretch, seated marching with  control    Consulted and Agree with Plan of Care  Patient       Patient will benefit from skilled therapeutic intervention in order to improve the following deficits and impairments:  Abnormal gait, Decreased activity tolerance, Decreased balance, Decreased coordination, Decreased endurance, Decreased mobility, Decreased range of motion, Decreased skin integrity, Decreased strength, Difficulty walking, Impaired flexibility, Impaired sensation, Impaired tone, Improper body mechanics, Postural dysfunction, Pain  Visit Diagnosis: Other  symptoms and signs involving the nervous system  Other abnormalities of gait and mobility  Muscle weakness (generalized)     Problem List Patient Active Problem List   Diagnosis Date Noted  . Spastic hemiparesis (HCC)   . Labile blood glucose   . Acute pain of left shoulder   . AKI (acute kidney injury) (HCC)   . Controlled type 2 diabetes mellitus with hyperglycemia, without long-term current use of insulin (HCC)   . Dysphagia, post-stroke   . Atrial fibrillation (HCC)   . 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 Pressure injury of skin 09/21/2018  . Cerebrovascular accident (CVA) with involvement of left side of body (HCC) 09/20/2018  . Atrial fibrillation with RVR (HCC) 09/20/2018  . Hypertension   . Rhabdomyolysis   . Type 2 diabetes mellitus (HCC)   . Hyperlipidemia   . Hyperbilirubinemia   . Leukocytosis   . Hypernatremia    10:34 AM, 04/26/19 Georges Lynch PT DPT  Physical Therapist with [facility]  [facility]  [phone]   [facility] [address] [place], [place], [address] Phone: [phone]   Fax:  [phone]  Name: Rachel Fox MRN: [id number] Date of Birth: 1946-02-29

## 2019-04-28 ENCOUNTER — Other Ambulatory Visit: Payer: Self-pay

## 2019-04-28 ENCOUNTER — Ambulatory Visit (HOSPITAL_COMMUNITY): Payer: Medicare Other | Admitting: Physical Therapy

## 2019-04-28 ENCOUNTER — Encounter (HOSPITAL_COMMUNITY): Payer: Self-pay | Admitting: Physical Therapy

## 2019-04-28 DIAGNOSIS — R29818 Other symptoms and signs involving the nervous system: Secondary | ICD-10-CM

## 2019-04-28 DIAGNOSIS — M6281 Muscle weakness (generalized): Secondary | ICD-10-CM

## 2019-04-28 DIAGNOSIS — I69354 Hemiplegia and hemiparesis following cerebral infarction affecting left non-dominant side: Secondary | ICD-10-CM | POA: Diagnosis not present

## 2019-04-28 DIAGNOSIS — R2689 Other abnormalities of gait and mobility: Secondary | ICD-10-CM

## 2019-04-28 NOTE — Therapy (Signed)
[facility] [address] [place], [place], [address] Phone: [phone]   Fax:  [phone]  Physical Therapy Treatment  Patient Details  Name: Rachel Fox MRN: [id number] Date of Birth: [birth year] Referring Provider (PT): Nita Sells   Encounter Date: 04/28/2019  PT End of Session - 04/28/19 1121    Visit Number  11    Number of Visits  16    Date for PT Re-Evaluation  05/20/19   Progress note complete 04/18/19   Authorization Type  Primary: Medicare part A and B secondary: [employer]    Authorization Time Period  02/24/19-04/20/18; 04/18/19-05/20/19    Authorization - Visit Number  3    Authorization - Number of Visits  10    PT Start Time  1120    PT Stop Time  1200    PT Time Calculation (min)  40 min    Equipment Utilized During Treatment  Gait belt    Activity Tolerance  Patient tolerated treatment well;Patient limited by fatigue    Behavior During Therapy  [facility] for tasks assessed/performed       Past Medical History:  Diagnosis Date  . Atrial fibrillation (HCC)   . CVA (cerebral vascular accident) (HCC)   . High cholesterol   . Hypertension   . Type 2 diabetes mellitus (HCC)     Past Surgical History:  Procedure Laterality Date  . ABDOMINAL HYSTERECTOMY    . TUBAL LIGATION      There were no vitals filed for this visit.  Subjective Assessment - 04/28/19 1122    Subjective  Patient says she felt a whole lot better after last session and was not as fatigued as normal after. Patient say she noticed some swelling in LT foot and that RT leg muscle are sore today but reports no pain currently.    Patient is accompained by:  --   Aid- Malachi Bonds   Pertinent History  CVA, hypertension, diabetes    Limitations  Standing;House hold activities;Walking    Patient Stated Goals  to improve balance and gait    Currently in Pain?  No/denies                       [facility] Adult PT Treatment/Exercise - 04/28/19 0001      Knee/Hip Exercises: Standing   Gait Training  90 feet with hemi walker , min gaurd and WC follow    Other Standing Knee Exercises  staggered stance 2 x 40" each, min gard for balance and using hemi walker on RT    verbal cues for LLE placemetn and heel approx. to floor          Knee/Hip Exercises: Seated   Sit to Sand  2 sets;5 reps;with UE support   min A from low mat            PT Education - 04/28/19 1228    Education Details  Discussed improvements and significance of verbal cues with gait training. Discussed possible benefit of AFO referral for helping LT foot placement during gait.    Person(s) Educated  Patient;Caregiver(s)    Methods  Explanation    Comprehension  Verbalized understanding       PT Short Term Goals - 04/18/19 1111      PT SHORT TERM GOAL #1   Title  Patient will be independent with HEP in order to optimze functional outcomes.    Baseline  Patient verbalized and demos compliance  with seated marches, LAQ, hip adduciton squeeze    Time  4    Period  Weeks    Status  Achieved    Target Date  03/24/19        PT Long Term Goals - 04/18/19 1112      PT LONG TERM GOAL #1   Title  Patient will be able to ambulate at least 50 feet with LRAD  to demonstrate improved ability to access the bathroom in her home.    Baseline  Current: 10 feet with hemi walker, and Mod A    Time  8    Period  Weeks    Status  On-going      PT LONG TERM GOAL #2   Title  Patient will be able to perform sit to stand with supervision in order to demonstrate improved LE strength.    Baseline  Current: Able to perform from wheelchair with with Min A and verbal cues for hand and foot placement, and forward momentum    Time  8    Period  Weeks    Status  On-going      PT LONG TERM GOAL #3   Title  Patient will report ability to transfer to shower chair for improved ability to perform hygenic activities.    Baseline  Says she has not tried, does not trust doing this yet, even  with assist from aid    Time  8    Period  Weeks    Status  On-going      PT LONG TERM GOAL #4   Title  Patient will be able to complete bed mobility independently for improved ease of self care.    Baseline  Patietn reports conintued need of help from aid to assist    Time  8    Period  Weeks    Status  On-going            Plan - 04/28/19 1222    Clinical Impression Statement  Patient tolerated session well today. Patient was able to progress reps with sit to stands, hold time with staggered stance balance and improved ambulation distance to 90 feet. Patient requires continued verbal and tactile cueing for LT foot placement with standing balance as well as gait, but demos good improvement in overall endurance and tolerance to activity today. Patient did demo LOB x 3 during gait training due to improper foot placement but was corrected with therapist assist. May seek information regarding referral for LT ankle AFO to help with this issue.    Personal Factors and Comorbidities  Age;Behavior Pattern;Comorbidity 3+    Comorbidities  CVA, hypertension, diabetes    Examination-Activity Limitations  Bathing;Bed Mobility;Bend;Caring for Others;Carry;Dressing;Hygiene/Grooming;Lift;Locomotion Level;Sit;Squat;Stairs;Stand;Toileting;Transfers    Examination-Participation Restrictions  Cleaning;Community Activity;Laundry;Meal Prep;Shop;Volunteer;Yard Work    Conservation officer, historic buildings  Evolving/Moderate complexity    Rehab Potential  Fair    PT Frequency  2x / week    PT Duration  4 weeks    PT Treatment/Interventions  ADLs/Self Care Home Management;Aquatic Therapy;Biofeedback;Cryotherapy;Iontophoresis 4mg /ml Dexamethasone;Electrical Stimulation;Moist Heat;Traction;Ultrasound;DME Instruction;Gait training;Stair training;Functional mobility training;Therapeutic activities;Therapeutic exercise;Balance training;Neuromuscular re-education;Patient/family education;Orthotic Fit/Training;Wheelchair  mobility training;Manual techniques;Compression bandaging;Passive range of motion;Dry needling;Energy conservation;Splinting;Taping;Vasopneumatic Device;Spinal Manipulations;Joint Manipulations    PT Next Visit Plan  Continue to progress BLE strength and mobility as able. Progress gait with hemi walker and balance as tolerated. F/U on LT ankle AFO    PT Home Exercise Plan  12/15: seated HS stretch, seated marching with control  Consulted and Agree with Plan of Care  Patient       Patient will benefit from skilled therapeutic intervention in order to improve the following deficits and impairments:  Abnormal gait, Decreased activity tolerance, Decreased balance, Decreased coordination, Decreased endurance, Decreased mobility, Decreased range of motion, Decreased skin integrity, Decreased strength, Difficulty walking, Impaired flexibility, Impaired sensation, Impaired tone, Improper body mechanics, Postural dysfunction, Pain  Visit Diagnosis: Other symptoms and signs involving the nervous system  Other abnormalities of gait and mobility  Muscle weakness (generalized)     Problem List Patient Active Problem List   Diagnosis Date Noted  . Spastic hemiparesis ([place])   . Labile blood glucose   . Acute pain of left shoulder   . AKI (acute kidney injury) ([place])   . Controlled type 2 diabetes mellitus with hyperglycemia, without long-term current use of insulin ([place])   . Dysphagia, post-stroke   . Atrial fibrillation ([place])   . 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 Pressure injury of skin 09/21/2018  . Cerebrovascular accident (CVA) with involvement of left side of body ([place]) 09/20/2018  . Atrial fibrillation with RVR ([place]) 09/20/2018  . Hypertension   . Rhabdomyolysis   . Type 2 diabetes mellitus ([place])   . Hyperlipidemia   . Hyperbilirubinemia   . Leukocytosis   . Hypernatremia    12:29 PM, 04/28/19 Josue Hector PT DPT  Physical Therapist with [facility]  [phone] [place] [address] [place], [place], [address] Phone: [phone]   Fax:  [phone]  Name: Rachel Fox MRN: [id number] Date of Birth: July 08, 1945

## 2019-05-03 ENCOUNTER — Other Ambulatory Visit: Payer: Self-pay

## 2019-05-03 ENCOUNTER — Encounter (HOSPITAL_COMMUNITY): Payer: Self-pay | Admitting: Physical Therapy

## 2019-05-03 ENCOUNTER — Ambulatory Visit (HOSPITAL_COMMUNITY): Payer: Medicare Other | Admitting: Physical Therapy

## 2019-05-03 DIAGNOSIS — R2689 Other abnormalities of gait and mobility: Secondary | ICD-10-CM | POA: Diagnosis not present

## 2019-05-03 DIAGNOSIS — R29818 Other symptoms and signs involving the nervous system: Secondary | ICD-10-CM | POA: Diagnosis not present

## 2019-05-03 DIAGNOSIS — I69354 Hemiplegia and hemiparesis following cerebral infarction affecting left non-dominant side: Secondary | ICD-10-CM | POA: Diagnosis not present

## 2019-05-03 DIAGNOSIS — M6281 Muscle weakness (generalized): Secondary | ICD-10-CM | POA: Diagnosis not present

## 2019-05-03 NOTE — Therapy (Signed)
[place], [place], [address] Phone: [phone]   Fax:  [phone]  Physical Therapy Treatment  Patient Details  Name: Rachel Fox MRN: [id number] Date of Birth: 1945/07/16 Referring Provider (PT): Allyn Kenner   Encounter Date: 05/03/2019  PT End of Session - 05/03/19 1041    Visit Number  12    Number of Visits  16    Date for PT Re-Evaluation  05/20/19   Progress note complete 04/18/19   Authorization Type  Primary: Medicare part A and B secondary: [place] Time Period  02/24/19-04/20/18; 04/18/19-05/20/19    Authorization - Visit Number  4    Authorization - Number of Visits  10    PT Start Time  1036    PT Stop Time  1117    PT Time Calculation (min)  41 min    Equipment Utilized During Treatment  Gait belt    Activity Tolerance  Patient tolerated treatment well;Patient limited by fatigue    Behavior During Therapy  [facility] for tasks assessed/performed       Past Medical History:  Diagnosis Date  . Atrial fibrillation ([place])   . CVA (cerebral vascular accident) ([place])   . High cholesterol   . Hypertension   . Type 2 diabetes mellitus ([place])     Past Surgical History:  Procedure Laterality Date  . ABDOMINAL HYSTERECTOMY    . TUBAL LIGATION      There were no vitals filed for this visit.  Subjective Assessment - 05/03/19 1040    Subjective  Patient says she is doing ok today. Says she was sore after yesteerday at the center. Says she will be there all week due to her aid is on vacation.    Patient is accompained by:  --    Pertinent History  CVA, hypertension, diabetes    Limitations  Standing;House hold activities;Walking    Patient Stated Goals  to improve balance and gait    Currently in Pain?  No/denies                       [facility] Adult PT Treatment/Exercise - 05/03/19 0001      Knee/Hip Exercises: Aerobic   Nustep  5 min EOS for conditioning and mobility        Knee/Hip Exercises: Standing   Gait Training  20 feet with hemi walker, Min A     Other Standing Knee Exercises  staggered stance 2 x 40" each, min gard for balance and using hemi walker on RT       Knee/Hip Exercises: Seated   Long Arc Quad  10 reps    Sit to [employer]  2 sets;5 reps;with UE support               PT Short Term Goals - 04/18/19 1111      PT SHORT TERM GOAL #1   Title  Patient will be independent with HEP in order to optimze functional outcomes.    Baseline  Patient verbalized and demos compliance with seated marches, LAQ, hip adduciton squeeze    Time  4    Period  Weeks    Status  Achieved    Target Date  03/24/19        PT Long Term Goals - 04/18/19 1112      PT LONG TERM GOAL #1   Title  Patient will be able to ambulate at  least 50 feet with LRAD  to demonstrate improved ability to access the bathroom in her home.    Baseline  Current: 10 feet with hemi walker, and Mod A    Time  8    Period  Weeks    Status  On-going      PT LONG TERM GOAL #2   Title  Patient will be able to perform sit to stand with supervision in order to demonstrate improved LE strength.    Baseline  Current: Able to perform from wheelchair with with Min A and verbal cues for hand and foot placement, and forward momentum    Time  8    Period  Weeks    Status  On-going      PT LONG TERM GOAL #3   Title  Patient will report ability to transfer to shower chair for improved ability to perform hygenic activities.    Baseline  Says she has not tried, does not trust doing this yet, even with assist from aid    Time  8    Period  Weeks    Status  On-going      PT LONG TERM GOAL #4   Title  Patient will be able to complete bed mobility independently for improved ease of self care.    Baseline  Patietn reports conintued need of help from aid to assist    Time  8    Period  Weeks    Status  On-going            Plan - 05/03/19 1154    Clinical Impression Statement   Patient demos increased fatigue today. Patient requires increased verbal cueing and mod A for sit to stand transfer from [facility], and requires continued VCs for weight shift and LLE placement prior to transfers. Patient required Min A with sit to stands from elevated surface, but with continued LT lean, despite max VCs. Gait training shortened today due to increased fatigue and pt presentation. Patient with LOB x 1, corrected with therapist assist. Patient also had increased difficulty with standing static balance and was very challenged maintaining foot placement with RT LE lead. Patient required several breaks for rest during todays activity due to fatigue. Faxed request to MD for LT AFO referral.    Personal Factors and Comorbidities  Age;Behavior Pattern;Comorbidity 3+    Comorbidities  CVA, hypertension, diabetes    Examination-Activity Limitations  Bathing;Bed Mobility;Bend;Caring for Others;Carry;Dressing;Hygiene/Grooming;Lift;Locomotion Level;Sit;Squat;Stairs;Stand;Toileting;Transfers    Examination-Participation Restrictions  Cleaning;Community Activity;Laundry;Meal Prep;Shop;Volunteer;Yard Work    Conservation officer, historic buildings  Evolving/Moderate complexity    Rehab Potential  Fair    PT Frequency  2x / week    PT Duration  4 weeks    PT Treatment/Interventions  ADLs/Self Care Home Management;Aquatic Therapy;Biofeedback;Cryotherapy;Iontophoresis 4mg /ml Dexamethasone;Electrical Stimulation;Moist Heat;Traction;Ultrasound;DME Instruction;Gait training;Stair training;Functional mobility training;Therapeutic activities;Therapeutic exercise;Balance training;Neuromuscular re-education;Patient/family education;Orthotic Fit/Training;Wheelchair mobility training;Manual techniques;Compression bandaging;Passive range of motion;Dry needling;Energy conservation;Splinting;Taping;Vasopneumatic Device;Spinal Manipulations;Joint Manipulations    PT Next Visit Plan  Continue to progress BLE strength and mobility as  able. Progress gait with hemi walker and balance as tolerated. F/U on LT ankle AFO    PT Home Exercise Plan  12/15: seated HS stretch, seated marching with control    Consulted and Agree with Plan of Care  Patient       Patient will benefit from skilled therapeutic intervention in order to improve the following deficits and impairments:  Abnormal gait, Decreased activity tolerance, Decreased balance, Decreased coordination, Decreased endurance, Decreased mobility, Decreased range  of motion, Decreased skin integrity, Decreased strength, Difficulty walking, Impaired flexibility, Impaired sensation, Impaired tone, Improper body mechanics, Postural dysfunction, Pain  Visit Diagnosis: Other symptoms and signs involving the nervous system  Other abnormalities of gait and mobility  Muscle weakness (generalized)     Problem List Patient Active Problem List   Diagnosis Date Noted  . Spastic hemiparesis (HCC)   . Labile blood glucose   . Acute pain of left shoulder   . AKI (acute kidney injury) (HCC)   . Controlled type 2 diabetes mellitus with hyperglycemia, without long-term current use of insulin (HCC)   . Dysphagia, post-stroke   . Atrial fibrillation (HCC)   . 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 Pressure injury of skin 09/21/2018  . Cerebrovascular accident (CVA) with involvement of left side of body (HCC) 09/20/2018  . Atrial fibrillation with RVR (HCC) 09/20/2018  . Hypertension   . Rhabdomyolysis   . Type 2 diabetes mellitus (HCC)   . Hyperlipidemia   . Hyperbilirubinemia   . Leukocytosis   . Hypernatremia    12:05 PM, 05/03/19 Georges Lynch PT DPT  Physical Therapist with [facility]  [facility]  [phone]   [facility] [address] [place], [place], [address] Phone: [phone]   Fax:  [phone]  Name: Rachel Fox MRN: [id number] Date of Birth: 11-05-1945

## 2019-05-04 ENCOUNTER — Telehealth (HOSPITAL_COMMUNITY): Payer: Self-pay | Admitting: Physical Therapy

## 2019-05-04 ENCOUNTER — Ambulatory Visit (HOSPITAL_COMMUNITY): Payer: Medicare Other | Admitting: Physical Therapy

## 2019-05-04 NOTE — Telephone Encounter (Signed)
Called patient about missed appt today at 11:15AM. No answer, left voicemail reminder for next scheduled appt on 05/09/19.  1:21 PM, 05/04/19 Georges Lynch PT DPT  Physical Therapist with [facility]  [phone]

## 2019-05-09 ENCOUNTER — Ambulatory Visit (HOSPITAL_COMMUNITY): Payer: Medicare Other | Admitting: Physical Therapy

## 2019-05-09 ENCOUNTER — Ambulatory Visit (HOSPITAL_COMMUNITY): Payer: Medicare Other | Admitting: Specialist

## 2019-05-09 ENCOUNTER — Encounter (HOSPITAL_COMMUNITY): Payer: Self-pay | Admitting: Specialist

## 2019-05-09 ENCOUNTER — Other Ambulatory Visit: Payer: Self-pay

## 2019-05-09 ENCOUNTER — Encounter (HOSPITAL_COMMUNITY): Payer: Self-pay | Admitting: Physical Therapy

## 2019-05-09 DIAGNOSIS — R2689 Other abnormalities of gait and mobility: Secondary | ICD-10-CM | POA: Diagnosis not present

## 2019-05-09 DIAGNOSIS — R29818 Other symptoms and signs involving the nervous system: Secondary | ICD-10-CM

## 2019-05-09 DIAGNOSIS — M6281 Muscle weakness (generalized): Secondary | ICD-10-CM

## 2019-05-09 DIAGNOSIS — I69354 Hemiplegia and hemiparesis following cerebral infarction affecting left non-dominant side: Secondary | ICD-10-CM

## 2019-05-09 NOTE — Therapy (Signed)
[place] [facility] [address] [place], [place], [address] Phone: [phone]   Fax:  [phone]  Physical Therapy Treatment  Patient Details  Name: Rachel Fox MRN: [id number] Date of Birth: 1946/03/12 Referring Provider (PT): Nita Sells   Encounter Date: 05/09/2019  PT End of Session - 05/09/19 1309    Visit Number  13    Number of Visits  16    Date for PT Re-Evaluation  05/20/19   Progress note complete 04/18/19   Authorization Type  Primary: Medicare part A and B secondary: [employer]    Authorization Time Period  02/24/19-04/20/18; 04/18/19-05/20/19    Authorization - Visit Number  5    Authorization - Number of Visits  10    PT Start Time  1305    PT Stop Time  1345    PT Time Calculation (min)  40 min    Equipment Utilized During Treatment  Gait belt    Activity Tolerance  Patient tolerated treatment well;Patient limited by fatigue    Behavior During Therapy  [facility] for tasks assessed/performed       Past Medical History:  Diagnosis Date  . Atrial fibrillation (HCC)   . CVA (cerebral vascular accident) (HCC)   . High cholesterol   . Hypertension   . Type 2 diabetes mellitus (HCC)     Past Surgical History:  Procedure Laterality Date  . ABDOMINAL HYSTERECTOMY    . TUBAL LIGATION      There were no vitals filed for this visit.  Subjective Assessment - 05/09/19 1428    Subjective  Patient says her leg muscles are sore today. Says could be do to the weather. Patient denies pain, just muscle soreness.    Pertinent History  CVA, hypertension, diabetes    Limitations  Standing;House hold activities;Walking    Patient Stated Goals  to improve balance and gait    Currently in Pain?  No/denies                       [facility] Adult PT Treatment/Exercise - 05/09/19 0001      Knee/Hip Exercises: Standing   Gait Training  25' and 25' feet with hemi walker, Min A     Other Standing Knee Exercises  standing marches for  improved weightshifting using RT hemi walker x 20    Other Standing Knee Exercises  staggered stance 3 x 30" each, min gard for balance and using hemi walker on RT       Knee/Hip Exercises: Seated   Long Arc Quad  10 reps    Sit to [employer]  3 sets;5 reps;with UE support   2nd set ball bw knees, 3rd set ball bw feet            PT Education - 05/09/19 1423    Education Details  Educated on referral for AFO and benefit to gait training. Gave patient referral and contact info for DME company    Person(s) Educated  Patient    Methods  Explanation    Comprehension  Verbalized understanding       PT Short Term Goals - 04/18/19 1111      PT SHORT TERM GOAL #1   Title  Patient will be independent with HEP in order to optimze functional outcomes.    Baseline  Patient verbalized and demos compliance with seated marches, LAQ, hip adduciton squeeze    Time  4    Period  Weeks  Status  Achieved    Target Date  03/24/19        PT Long Term Goals - 04/18/19 1112      PT LONG TERM GOAL #1   Title  Patient will be able to ambulate at least 50 feet with LRAD  to demonstrate improved ability to access the bathroom in her home.    Baseline  Current: 10 feet with hemi walker, and Mod A    Time  8    Period  Weeks    Status  On-going      PT LONG TERM GOAL #2   Title  Patient will be able to perform sit to stand with supervision in order to demonstrate improved LE strength.    Baseline  Current: Able to perform from wheelchair with with Min A and verbal cues for hand and foot placement, and forward momentum    Time  8    Period  Weeks    Status  On-going      PT LONG TERM GOAL #3   Title  Patient will report ability to transfer to shower chair for improved ability to perform hygenic activities.    Baseline  Says she has not tried, does not trust doing this yet, even with assist from aid    Time  8    Period  Weeks    Status  On-going      PT LONG TERM GOAL #4   Title  Patient  will be able to complete bed mobility independently for improved ease of self care.    Baseline  Patietn reports conintued need of help from aid to assist    Time  8    Period  Weeks    Status  On-going            Plan - 05/09/19 1419    Clinical Impression Statement  Patient able to performed increased reps of sit to stands today but required continued verbal and tactile cueing for LLE placement. Added pink ball between feet to improve BOS by keeping space between feet. Patient noted decreased discomfort in knees with this method. Patient reports discomfort in RT hip during gait training and required rest break x 2. Patient with continued difficulty during gait, and displays leftward lean, decreased LLE ground clearance and uneven weight shifting. Patient required therapist assist for LOB x 2 today. Patient educated on and cued on appropriate weight shifting to improve LLE advancement. Added standing marches to practice this. Patient noted decreased discomfort in RT hip afterward. Educated patient on AFO referral, and have her referral signed by MD and contact for DME company to schedule fitting.    Personal Factors and Comorbidities  Age;Behavior Pattern;Comorbidity 3+    Comorbidities  CVA, hypertension, diabetes    Examination-Activity Limitations  Bathing;Bed Mobility;Bend;Caring for Others;Carry;Dressing;Hygiene/Grooming;Lift;Locomotion Level;Sit;Squat;Stairs;Stand;Toileting;Transfers    Examination-Participation Restrictions  Cleaning;Community Activity;Laundry;Meal Prep;Shop;Volunteer;Yard Work    Merchant [employer] officer  Evolving/Moderate complexity    Rehab Potential  Fair    PT Frequency  2x / week    PT Duration  4 weeks    PT Treatment/Interventions  ADLs/Self Care Home Management;Aquatic Therapy;Biofeedback;Cryotherapy;Iontophoresis 4mg /ml Dexamethasone;Electrical Stimulation;Moist Heat;Traction;Ultrasound;DME Instruction;Gait training;Stair training;Functional  mobility training;Therapeutic activities;Therapeutic exercise;Balance training;Neuromuscular re-education;Patient/family education;Orthotic Fit/Training;Wheelchair mobility training;Manual techniques;Compression bandaging;Passive range of motion;Dry needling;Energy conservation;Splinting;Taping;Vasopneumatic Device;Spinal Manipulations;Joint Manipulations    PT Next Visit Plan  Continue to progress BLE strength and mobility as able. Progress gait with hemi walker and balance as tolerated. F/U on LT ankle AFO  PT Home Exercise Plan  12/15: seated HS stretch, seated marching with control    Consulted and Agree with Plan of Care  Patient       Patient will benefit from skilled therapeutic intervention in order to improve the following deficits and impairments:  Abnormal gait, Decreased activity tolerance, Decreased balance, Decreased coordination, Decreased endurance, Decreased mobility, Decreased range of motion, Decreased skin integrity, Decreased strength, Difficulty walking, Impaired flexibility, Impaired sensation, Impaired tone, Improper body mechanics, Postural dysfunction, Pain  Visit Diagnosis: Other symptoms and signs involving the nervous system  Other abnormalities of gait and mobility  Muscle weakness (generalized)     Problem List Patient Active Problem List   Diagnosis Date Noted  . Spastic hemiparesis (HCC)   . Labile blood glucose   . Acute pain of left shoulder   . AKI (acute kidney injury) (HCC)   . Controlled type 2 diabetes mellitus with hyperglycemia, without long-term current use of insulin (HCC)   . Dysphagia, post-stroke   . Atrial fibrillation (HCC)   . 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 Pressure injury of skin 09/21/2018  . Cerebrovascular accident (CVA) with involvement of left side of body (HCC) 09/20/2018  . Atrial fibrillation with RVR (HCC) 09/20/2018  . Hypertension   . Rhabdomyolysis   . Type 2 diabetes mellitus (HCC)   .  Hyperlipidemia   . Hyperbilirubinemia   . Leukocytosis   . Hypernatremia    2:29 PM, 05/09/19 Georges Lynch PT DPT  Physical Therapist with [facility]  [facility]  [phone]   [facility] [address] [place], [place], [address] Phone: [phone]   Fax:  [phone]  Name: LIYANA SUNIGA MRN: [id number] Date of Birth: March 06, 1946

## 2019-05-09 NOTE — Patient Instructions (Signed)
Your Splint This splint should initially be fitted by a healthcare practitioner.  The healthcare practitioner is responsible for providing wearing instructions and precautions to the patient, other healthcare practitioners and care provider involved in the patient's care.  This splint was custom made for you. Please read the following instructions to learn about wearing and caring for your splint.  Precautions Should your splint cause any of the following problems, remove the splint immediately and contact your therapist/physician.  Swelling  Severe Pain  Pressure Areas  Stiffness  Numbness  Do not wear your splint while operating machinery unless it has been fabricated for that purpose.  When To Wear Your Splint Where your splint according to your therapist/physician instructions. All the time - remove for hygiene purposes To modify the splint, gently bend the splint. Putting on your splint: Position arm in splint.  Secure the middle strap around your wrist first. Position thumb in thumb groove, secure strap. Work to position your fingers and forearm in the splint and then secure the straps.  Care and Cleaning of Your Splint 1. Keep your splint away from open flames. 2. Your splint will lose its shape in temperatures over 135 degrees Farenheit, ( in car windows, near radiators, ovens or in hot water).  Never make any adjustments to your splint, if the splint needs adjusting remove it and make an appointment to see your therapist. 3. Your splint, including the cushion liner may be cleaned with soap and lukewarm water.  Do not immerse in hot water over 135 degrees Farenheit. 4. Straps may be washed with soap and water, but do not moisten the self-adhesive portion. 5. For ink or hard to remove spots use a scouring cleanser which contains chlorine.  Rinse the splint thoroughly after using chlorine cleanser. 6.   Follow additional manufacturing cleaning instructions.  Questions -   Call Leia Alf, [place], OTR/L at [phone]

## 2019-05-09 NOTE — Therapy (Addendum)
[place], [place], [address] Phone: [phone]   Fax:  [phone]  Occupational Therapy Treatment  Patient Details  Name: Rachel Fox MRN: [id number] Date of Birth: [birth year] Referring Provider (OT): Dr. Wende Neighbors   Encounter Date: 05/09/2019  OT End of Session - 05/09/19 1420    Visit Number  2    Number of Visits  2    Date for OT Re-Evaluation  05/14/19    Authorization Type  1) Medicare A & B 2) [employer]    Authorization Time Period  progress note by 10th visit    Authorization - Visit Number  2    Authorization - Number of Visits  10    OT Start Time  0762    OT Stop Time  1418    OT Time Calculation (min)  30 min    Activity Tolerance  Patient tolerated treatment well    Behavior During Therapy  [facility] for tasks assessed/performed       Past Medical History:  Diagnosis Date  . Atrial fibrillation ([place])   . CVA (cerebral vascular accident) ([place])   . High cholesterol   . Hypertension   . Type 2 diabetes mellitus ([place])     Past Surgical History:  Procedure Laterality Date  . ABDOMINAL HYSTERECTOMY    . TUBAL LIGATION      There were no vitals filed for this visit.  Subjective Assessment - 05/09/19 1419    Subjective   S:  I had one of those splints, I left it at the facility i was staying at.    Currently in Pain?  No/denies         [facility] OT Assessment - 05/09/19 0001      Assessment   Medical Diagnosis  LUE spastic hemiplegia s/p CVA      Precautions   Precautions  Fall      Restrictions   Weight Bearing Restrictions  No               OT Treatments/Exercises (OP) - 05/09/19 0001      Splinting   Splinting  provided manual therapy to wrist, elbow, forearm, digits to inhibit tone to allow for improved flexibility needed to don splint.  issued prefabricated padded [place] for LUE.  Therapist modified wrist region to be at 0 extension to 5 degrees of flexion due to  current level of flexor spasticity patient is experiencing.  thumb positioning adjusted for increased thumb flexion as resting had position was painful to her thumb.  Therapist educated patient on donning and doffing of splint, how to modify positioning, cleaning of splint and wear and care schedule.  Patient will be dependent with donning splint due to level of spasticity in her LUE.  Patient donned for 15 minutes in clinic with no pain points or contraindications present.               OT Education - 05/09/19 1419    Education Details  educated patient on technique to don and doff LUE resting hand splint and provided written instructions    Person(s) Educated  Patient    Methods  Explanation;Demonstration;Handout    Comprehension  Verbalized understanding       OT Short Term Goals - 05/09/19 1422      OT SHORT TERM GOAL #1   Title  Pt will demonstrate ability to doff and donn splint for improvement in pain and left hand positioning.  Time  1    Period  Months    Status  Achieved    Target Date  05/14/19               Plan - 05/09/19 1420    Clinical Impression Statement  A: Adjusted splint at wrist and thumb region for improved comfort and fit due to high flexor tone present in patient's LUE.  Paitent able to wear splint for 15 minutes in clinic while therapist providing education, with good comfort.    Body Structure / Function / Physical Skills  ADL;Endurance;UE functional use;Fascial restriction;Pain;Flexibility;ROM;IADL;Strength;Mobility;Tone    Plan  P:  No plans for botox made at this time.  Patient issued left [facility] and voiced understanding on techniques to don and doff.  DC from skilled OT intervention this date.       Patient will benefit from skilled therapeutic intervention in order to improve the following deficits and impairments:   Body Structure / Function / Physical Skills: ADL, Endurance, UE functional use, Fascial restriction, Pain, Flexibility, ROM,  IADL, Strength, Mobility, Tone       Visit Diagnosis: Other symptoms and signs involving the nervous system  Hemiplegia and hemiparesis following cerebral infarction affecting left non-dominant side [facility])    Problem List Patient Active Problem List   Diagnosis Date Noted  . Spastic hemiparesis ([place])   . Labile blood glucose   . Acute pain of left shoulder   . AKI (acute kidney injury) ([place])   . Controlled type 2 diabetes mellitus with hyperglycemia, without long-term current use of insulin ([place])   . Dysphagia, post-stroke   . Atrial fibrillation ([place])   . Right middle cerebral artery stroke ([place]) 09/27/2018  . Pressure injury of skin 09/21/2018  . Cerebrovascular accident (CVA) with involvement of left side of body ([place]) 09/20/2018  . Atrial fibrillation with RVR ([place]) 09/20/2018  . Hypertension   . Rhabdomyolysis   . Type 2 diabetes mellitus ([place])   . Hyperlipidemia   . Hyperbilirubinemia   . Leukocytosis   . Hypernatremia   Vangie Bicker, MHA, OTR/L [phone] OCCUPATIONAL THERAPY DISCHARGE SUMMARY  Visits from Start of Care: 2  Current functional level related to goals / functional outcomes: LUE with max flexor synergy pattern, requiring max pa to move out of positioning passively.  Dependent with don/doff of LUE WHFO.   Remaining deficits: At baseline   Education / Equipment: Splint wear and care Plan: Patient agrees to discharge.  Patient goals were partially met. Patient is being discharged due to meeting the stated rehab goals.  ?????    Vangie Bicker, [place], OTR/L [phone]    05/09/2019, 2:27 PM  [place] [address] [place], [place], [address] Phone: [phone]   Fax:  [phone]  Name: Rachel Fox MRN: [id number] Date of Birth: 08/31/1945

## 2019-05-11 ENCOUNTER — Other Ambulatory Visit: Payer: Self-pay

## 2019-05-11 ENCOUNTER — Ambulatory Visit (HOSPITAL_COMMUNITY): Payer: Medicare Other | Admitting: Physical Therapy

## 2019-05-11 ENCOUNTER — Encounter (HOSPITAL_COMMUNITY): Payer: Self-pay | Admitting: Physical Therapy

## 2019-05-11 DIAGNOSIS — R2689 Other abnormalities of gait and mobility: Secondary | ICD-10-CM

## 2019-05-11 DIAGNOSIS — M6281 Muscle weakness (generalized): Secondary | ICD-10-CM | POA: Diagnosis not present

## 2019-05-11 DIAGNOSIS — I69354 Hemiplegia and hemiparesis following cerebral infarction affecting left non-dominant side: Secondary | ICD-10-CM | POA: Diagnosis not present

## 2019-05-11 DIAGNOSIS — R29818 Other symptoms and signs involving the nervous system: Secondary | ICD-10-CM | POA: Diagnosis not present

## 2019-05-11 NOTE — Therapy (Signed)
[place] [facility] [address] [place], [place], [address] Phone: [phone]   Fax:  [phone]  Physical Therapy Treatment  Patient Details  Name: Rachel Fox MRN: [id number] Date of Birth: 09-Oct-1945 Referring Provider (PT): Nita Sells   Encounter Date: 05/11/2019  PT End of Session - 05/11/19 1309    Visit Number  14    Number of Visits  16    Date for PT Re-Evaluation  05/20/19   Progress note complete 04/18/19   Authorization Type  Primary: Medicare part A and B secondary: [employer]    Authorization Time Period  02/24/19-04/20/18; 04/18/19-05/20/19    Authorization - Visit Number  6    Authorization - Number of Visits  10    PT Start Time  1302    PT Stop Time  1350    PT Time Calculation (min)  48 min    Equipment Utilized During Treatment  Gait belt    Activity Tolerance  Patient tolerated treatment well;Patient limited by fatigue    Behavior During Therapy  [facility] for tasks assessed/performed       Past Medical History:  Diagnosis Date  . Atrial fibrillation (HCC)   . CVA (cerebral vascular accident) (HCC)   . High cholesterol   . Hypertension   . Type 2 diabetes mellitus (HCC)     Past Surgical History:  Procedure Laterality Date  . ABDOMINAL HYSTERECTOMY    . TUBAL LIGATION      There were no vitals filed for this visit.  Subjective Assessment - 05/11/19 1309    Subjective  Patient says she is good today, just a little sore in glutes. No pain currently.    Pertinent History  CVA, hypertension, diabetes    Limitations  Standing;House hold activities;Walking    Patient Stated Goals  to improve balance and gait    Currently in Pain?  No/denies                       [facility] Adult PT Treatment/Exercise - 05/11/19 0001      Transfers   Sit to Stand  3: Mod assist    Stand to Sit  --    Stand Pivot Transfers  4: Min assist    Comments  Mod A with repositioning in chair       Knee/Hip Exercises:  Standing   Forward Step Up  Right;1 set;5 reps;Hand Hold: 1;Step Height: 2"   Mod A, verbal and tactile cues for LT foot placement    Gait Training  15' with hemi walker, Min A     Other Standing Knee Exercises  standing marches for improved weightshifting at // bars       Knee/Hip Exercises: Seated   Sit to Sand  2 sets;5 reps;with UE support   Min A and hemi walker              PT Short Term Goals - 04/18/19 1111      PT SHORT TERM GOAL #1   Title  Patient will be independent with HEP in order to optimze functional outcomes.    Baseline  Patient verbalized and demos compliance with seated marches, LAQ, hip adduciton squeeze    Time  4    Period  Weeks    Status  Achieved    Target Date  03/24/19        PT Long Term Goals - 04/18/19 1112      PT  LONG TERM GOAL #1   Title  Patient will be able to ambulate at least 50 feet with LRAD  to demonstrate improved ability to access the bathroom in her home.    Baseline  Current: 10 feet with hemi walker, and Mod A    Time  8    Period  Weeks    Status  On-going      PT LONG TERM GOAL #2   Title  Patient will be able to perform sit to stand with supervision in order to demonstrate improved LE strength.    Baseline  Current: Able to perform from wheelchair with with Min A and verbal cues for hand and foot placement, and forward momentum    Time  8    Period  Weeks    Status  On-going      PT LONG TERM GOAL #3   Title  Patient will report ability to transfer to shower chair for improved ability to perform hygenic activities.    Baseline  Says she has not tried, does not trust doing this yet, even with assist from aid    Time  8    Period  Weeks    Status  On-going      PT LONG TERM GOAL #4   Title  Patient will be able to complete bed mobility independently for improved ease of self care.    Baseline  Patietn reports conintued need of help from aid to assist    Time  8    Period  Weeks    Status  On-going             Plan - 05/11/19 1449    Clinical Impression Statement  Patient had more difficulty with repositioning in chair today. Patient cued for moving to center of wheelchair to begin sit to stand, but was unable with several attempts. Required Max verbal cues and Mod A to achieve. Patient continues to have poor return for weight shifting to scoot LT side to edge of chair and required mod A. Once in position patient did fairly well. Patient transferred to high mat and was able to perform sit to stands with Min A. Patient had increase fatigue with gait training today and had poor ground clearance of LLE even with verbal and tactile cues. Attempted to progress to small 2 inch step up, patient was very challenged with this. Required max verbal cues, tactile cues for LLE placement and therapist assist for LOB x 2. Patient with noted fatigue at end of session. Assisted patient with LT arm splint as she was unable to get it back on after taking it off this morning to clean her arm. Patient says she did not call about AFO fitting appointment yet.    Personal Factors and Comorbidities  Age;Behavior Pattern;Comorbidity 3+    Comorbidities  CVA, hypertension, diabetes    Examination-Activity Limitations  Bathing;Bed Mobility;Bend;Caring for Others;Carry;Dressing;Hygiene/Grooming;Lift;Locomotion Level;Sit;Squat;Stairs;Stand;Toileting;Transfers    Examination-Participation Restrictions  Cleaning;Community Activity;Laundry;Meal Prep;Shop;Volunteer;Yard Work    Merchant [employer] officer  Evolving/Moderate complexity    Rehab Potential  Fair    PT Frequency  2x / week    PT Duration  4 weeks    PT Treatment/Interventions  ADLs/Self Care Home Management;Aquatic Therapy;Biofeedback;Cryotherapy;Iontophoresis 4mg /ml Dexamethasone;Electrical Stimulation;Moist Heat;Traction;Ultrasound;DME Instruction;Gait training;Stair training;Functional mobility training;Therapeutic activities;Therapeutic exercise;Balance  training;Neuromuscular re-education;Patient/family education;Orthotic Fit/Training;Wheelchair mobility training;Manual techniques;Compression bandaging;Passive range of motion;Dry needling;Energy conservation;Splinting;Taping;Vasopneumatic Device;Spinal Manipulations;Joint Manipulations    PT Next Visit Plan  Continue to progress BLE strength and mobility as able.  Progress gait with hemi walker and balance as tolerated. F/U on LT ankle AFO. Try step up again.    PT Home Exercise Plan  12/15: seated HS stretch, seated marching with control    Consulted and Agree with Plan of Care  Patient       Patient will benefit from skilled therapeutic intervention in order to improve the following deficits and impairments:  Abnormal gait, Decreased activity tolerance, Decreased balance, Decreased coordination, Decreased endurance, Decreased mobility, Decreased range of motion, Decreased skin integrity, Decreased strength, Difficulty walking, Impaired flexibility, Impaired sensation, Impaired tone, Improper body mechanics, Postural dysfunction, Pain  Visit Diagnosis: Other symptoms and signs involving the nervous system  Other abnormalities of gait and mobility  Muscle weakness (generalized)     Problem List Patient Active Problem List   Diagnosis Date Noted  . Spastic hemiparesis (HCC)   . Labile blood glucose   . Acute pain of left shoulder   . AKI (acute kidney injury) (HCC)   . Controlled type 2 diabetes mellitus with hyperglycemia, without long-term current use of insulin (HCC)   . Dysphagia, post-stroke   . Atrial fibrillation (HCC)   . 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 Pressure injury of skin 09/21/2018  . Cerebrovascular accident (CVA) with involvement of left side of body (HCC) 09/20/2018  . Atrial fibrillation with RVR (HCC) 09/20/2018  . Hypertension   . Rhabdomyolysis   . Type 2 diabetes mellitus (HCC)   . Hyperlipidemia   . Hyperbilirubinemia   .  Leukocytosis   . Hypernatremia    2:55 PM, 05/11/19 Georges Lynch PT DPT  Physical Therapist with [facility]  [facility]  [phone]   [facility] [address] [place], [place], [address] Phone: [phone]   Fax:  [phone]  Name: Rachel Fox MRN: [id number] Date of Birth: 02-11-1946

## 2019-05-16 ENCOUNTER — Telehealth (HOSPITAL_COMMUNITY): Payer: Self-pay | Admitting: Physical Therapy

## 2019-05-16 ENCOUNTER — Ambulatory Visit (HOSPITAL_COMMUNITY): Payer: Medicare Other | Admitting: Physical Therapy

## 2019-05-16 NOTE — Telephone Encounter (Signed)
pt's dtr called to cancel today's appt due to transportation

## 2019-05-18 ENCOUNTER — Ambulatory Visit (HOSPITAL_COMMUNITY): Payer: Medicare Other | Admitting: Physical Therapy

## 2019-05-18 ENCOUNTER — Telehealth (HOSPITAL_COMMUNITY): Payer: Self-pay | Admitting: Physical Therapy

## 2019-05-18 NOTE — Telephone Encounter (Signed)
pt's neice called to cancel today's appt due to transportation issues

## 2019-05-24 ENCOUNTER — Telehealth (HOSPITAL_COMMUNITY): Payer: Self-pay

## 2019-05-24 ENCOUNTER — Ambulatory Visit (HOSPITAL_COMMUNITY): Payer: Medicare Other

## 2019-05-24 NOTE — Telephone Encounter (Signed)
pt cancelled due to no transportation °

## 2019-05-26 ENCOUNTER — Encounter (HOSPITAL_COMMUNITY): Payer: Self-pay | Admitting: Physical Therapy

## 2019-05-26 ENCOUNTER — Ambulatory Visit (HOSPITAL_COMMUNITY): Payer: Medicare Other | Attending: Internal Medicine | Admitting: Physical Therapy

## 2019-05-26 ENCOUNTER — Other Ambulatory Visit: Payer: Self-pay

## 2019-05-26 DIAGNOSIS — R29818 Other symptoms and signs involving the nervous system: Secondary | ICD-10-CM

## 2019-05-26 DIAGNOSIS — R2689 Other abnormalities of gait and mobility: Secondary | ICD-10-CM | POA: Insufficient documentation

## 2019-05-26 DIAGNOSIS — M6281 Muscle weakness (generalized): Secondary | ICD-10-CM | POA: Diagnosis not present

## 2019-05-26 NOTE — Therapy (Signed)
[place], [place], [address] Phone: [phone]   Fax:  [phone]  Physical Therapy Treatment/ Progress Note  Patient Details  Name: Rachel Fox MRN: [id number] Date of Birth: September 27, 1945 Referring Provider (PT): Allyn Kenner   Encounter Date: 05/26/2019  Progress Note Reporting Period 04/18/19 to 05/26/19  See note below for Objective Data and Assessment of Progress/Goals.       PT End of Session - 05/26/19 1027    Visit Number  15    Number of Visits  23    Date for PT Re-Evaluation  06/24/19   PN complete 05/26/19   Authorization Type  Primary: Medicare part A and B secondary: [employer]    Authorization Time Period  02/24/19-04/20/18; 04/18/19-05/20/19    Authorization - Visit Number  6    Authorization - Number of Visits  10    Progress Note Due on Visit  16    PT Start Time  1028    PT Stop Time  1115    PT Time Calculation (min)  47 min    Equipment Utilized During Treatment  Gait belt    Activity Tolerance  Patient tolerated treatment well;Patient limited by fatigue    Behavior During Therapy  WFL for tasks assessed/performed       Past Medical History:  Diagnosis Date  . Atrial fibrillation ([place])   . CVA (cerebral vascular accident) ([place])   . High cholesterol   . Hypertension   . Type 2 diabetes mellitus ([place])     Past Surgical History:  Procedure Laterality Date  . ABDOMINAL HYSTERECTOMY    . TUBAL LIGATION      There were no vitals filed for this visit.  Subjective Assessment - 05/26/19 1042    Subjective  Patient says she is doing better, but still does not feel confident with her balance. Patient says her son has been helping her more lately as her aide has been out of town. Patient says she is scheduled to have AFO fitting on 05/31/19. Patient says she feels 75% improved with chair to table and chair to commode transfers. Says she has been working on some of these things at the center.     Pertinent History  CVA, hypertension, diabetes    Limitations  Standing;House hold activities;Walking    Patient Stated Goals  to improve balance and gait    Currently in Pain?  No/denies         [facility] PT Assessment - 05/26/19 0001      Assessment   Medical Diagnosis  LUE spastic hemiplegia s/p CVA    Referring Provider (PT)  Allyn Kenner    Onset Date/Surgical Date  09/18/18    Prior Therapy  Acute OT, CIR, HHOT      Precautions   Precautions  Fall      Restrictions   Weight Bearing Restrictions  No      Home Environment   Living Environment  Private residence    Living Arrangements  Children;Other (Comment)      Prior Function   Level of Independence  Independent      Cognition   Overall Cognitive Status  Within Functional Limits for tasks assessed      Bed Mobility   Bed Mobility  Supine to Sit;Sit to Supine    Supine to Sit  Contact Guard/Touching assist   with verbal cues for weight shift   Sit to Supine  Minimal Assistance -  Patient > 75%   assist with LEs, verbal cues for RT weight shift to pillow      Transfers   Sit to Stand  4: Min assist   LE    Sit to Stand Details (indicate cue type and reason)  Requires assist to avoid LT lean, and for LLE placement, mod verbal cueing       Ambulation/Gait   Ambulation/Gait  Yes    Ambulation/Gait Assistance  4: Min assist    Ambulation Distance (Feet)  70 Feet    Assistive device  Hemi-walker    Gait Pattern  Step-to pattern;Decreased step length - right;Decreased stance time - left;Ataxic;Left flexed knee in stance;Narrow base of support    Ambulation Surface  Level;Indoor    Gait Comments  Requires mod verbal cues for upright posture, weight shift to advance LLE, and LLE step length, heel strike and to avoid narrow stance                            PT Education - 05/26/19 1213    Education Details  On reassessment findings and POC    Person(s) Educated  Patient    Methods  Explanation     Comprehension  Verbalized understanding       PT Short Term Goals - 04/18/19 1111      PT SHORT TERM GOAL #1   Title  Patient will be independent with HEP in order to optimze functional outcomes.    Baseline  Patient verbalized and demos compliance with seated marches, LAQ, hip adduciton squeeze    Time  4    Period  Weeks    Status  Achieved    Target Date  03/24/19        PT Long Term Goals - 05/26/19 1114      PT LONG TERM GOAL #1   Title  Patient will be able to ambulate at least 50 feet with LRAD  to demonstrate improved ability to access the bathroom in her home.    Baseline  Current: 70 feet with hemi walker, min A, frequent VCs for foot placement    Time  8    Period  Weeks    Status  Partially Met      PT LONG TERM GOAL #2   Title  Patient will be able to perform sit to stand with supervision in order to demonstrate improved LE strength.    Baseline  Current: Min A, mod VCs for LLE placmement and weightshifting    Time  8    Period  Weeks    Status  On-going      PT LONG TERM GOAL #3   Title  Patient will report ability to transfer to shower chair for improved ability to perform hygenic activities.    Baseline  Pateint says she does not use this because it is too big, says she prefers sponge bathing    Time  8    Period  Weeks    Status  Deferred      PT LONG TERM GOAL #4   Title  Patient will be able to complete bed mobility independently for improved ease of self care.    Baseline  Able to perform supine to sit with Min guard and VCs, but requires assist for sit to supine (see objective measures)    Time  8    Period  Weeks    Status  On-going  Plan - 05/26/19 1214    Clinical Impression Statement  Patient is making good progress to therapy goals. Patient is able to ambulate farther, and shows improved tolerance to functional activity. Patient continues to require assist for functional mobility transfers, and requires frequent verbal cueing  for LLE placement with sit to stand, and with gait training. Overall return for cueing is improving. Patient was absent from therapy for 2 weeks prior, due to transportation issues, but has maintained progress well, as she is participating in exercise at an activity center while her nursing aide is out of town. Patient still with significant weakness on LT side, and holds LT arm and foot in abnormal position which is impacting her stability in standing and with gait. Patient is scheduled to have AFO fitting to address this issue for improved gait/ gait training. Patient will continue to benefit from skilled therapy services to address reaming deficits to improve LOF with ADLs, functional mobility and reduce risk for future falls.    Personal Factors and Comorbidities  Age;Behavior Pattern;Comorbidity 3+    Comorbidities  CVA, hypertension, diabetes    Examination-Activity Limitations  Bathing;Bed Mobility;Bend;Caring for Others;Carry;Dressing;Hygiene/Grooming;Lift;Locomotion Level;Sit;Squat;Stairs;Stand;Toileting;Transfers    Examination-Participation Restrictions  Cleaning;Community Activity;Laundry;Meal Prep;Shop;Volunteer;Yard Work    [employer]chant [employer] officer  Evolving/Moderate complexity    Rehab Potential  Fair    PT Frequency  2x / week    PT Duration  4 weeks    PT Treatment/Interventions  ADLs/Self Care Home Management;Aquatic Therapy;Biofeedback;Cryotherapy;Iontophoresis '4mg'$ /ml Dexamethasone;Electrical Stimulation;Moist Heat;Traction;Ultrasound;DME Instruction;Gait training;Stair training;Functional mobility training;Therapeutic activities;Therapeutic exercise;Balance training;Neuromuscular re-education;Patient/family education;Orthotic Fit/Training;Wheelchair mobility training;Manual techniques;Compression bandaging;Passive range of motion;Dry needling;Energy conservation;Splinting;Taping;Vasopneumatic Device;Spinal Manipulations;Joint Manipulations    PT Next Visit Plan  Continue to  progress BLE strength and mobility as able. Progress gait with hemi walker and balance as tolerated. F/U on LT ankle AFO. Try step up again.    PT Home Exercise Plan  12/15: seated HS stretch, seated marching with control    Consulted and Agree with Plan of Care  Patient       Patient will benefit from skilled therapeutic intervention in order to improve the following deficits and impairments:  Abnormal gait, Decreased activity tolerance, Decreased balance, Decreased coordination, Decreased endurance, Decreased mobility, Decreased range of motion, Decreased skin integrity, Decreased strength, Difficulty walking, Impaired flexibility, Impaired sensation, Impaired tone, Improper body mechanics, Postural dysfunction, Pain  Visit Diagnosis: Other symptoms and signs involving the nervous system  Other abnormalities of gait and mobility  Muscle weakness (generalized)     Problem List Patient Active Problem List   Diagnosis Date Noted  . Spastic hemiparesis ([place])   . Labile blood glucose   . Acute pain of left shoulder   . AKI (acute kidney injury) ([place])   . Controlled type 2 diabetes mellitus with hyperglycemia, without long-term current use of insulin ([place])   . Dysphagia, post-stroke   . Atrial fibrillation ([place])   . Right middle cerebral artery stroke ([place]) 09/27/2018  . Pressure injury of skin 09/21/2018  . Cerebrovascular accident (CVA) with involvement of left side of body ([place]) 09/20/2018  . Atrial fibrillation with RVR ([place]) 09/20/2018  . Hypertension   . Rhabdomyolysis   . Type 2 diabetes mellitus ([place])   . Hyperlipidemia   . Hyperbilirubinemia   . Leukocytosis   . Hypernatremia    12:22 PM, 05/26/19 Josue Hector PT DPT  Physical Therapist with [facility]  [phone]   [facility] [address]  [place], [place], [address] Phone: [phone]   Fax:  [phone]  Name: Rachel Fox MRN:  [id number] Date of Birth: Aug 03, 1945

## 2019-05-30 ENCOUNTER — Telehealth (HOSPITAL_COMMUNITY): Payer: Self-pay

## 2019-05-30 DIAGNOSIS — I69854 Hemiplegia and hemiparesis following other cerebrovascular disease affecting left non-dominant side: Secondary | ICD-10-CM | POA: Diagnosis not present

## 2019-05-30 NOTE — Telephone Encounter (Signed)
pt cancelled appt for tomorrow because she has to be fitted for her leg brace on tomorrow

## 2019-05-31 ENCOUNTER — Ambulatory Visit (HOSPITAL_COMMUNITY): Payer: Medicare Other

## 2019-06-02 ENCOUNTER — Other Ambulatory Visit: Payer: Self-pay

## 2019-06-02 ENCOUNTER — Ambulatory Visit (HOSPITAL_COMMUNITY): Payer: Medicare Other | Admitting: Physical Therapy

## 2019-06-02 DIAGNOSIS — R2689 Other abnormalities of gait and mobility: Secondary | ICD-10-CM | POA: Diagnosis not present

## 2019-06-02 DIAGNOSIS — R29818 Other symptoms and signs involving the nervous system: Secondary | ICD-10-CM | POA: Diagnosis not present

## 2019-06-02 DIAGNOSIS — M6281 Muscle weakness (generalized): Secondary | ICD-10-CM

## 2019-06-02 NOTE — Therapy (Signed)
[place], [place], [address] Phone: [phone]   Fax:  [phone]  Physical Therapy Treatment  Patient Details  Name: Rachel Fox MRN: [id number] Date of Birth: [birth year] Referring Provider (PT): Allyn Kenner   Encounter Date: 06/02/2019  PT End of Session - 06/02/19 1531    Visit Number  16    Number of Visits  23    Date for PT Re-Evaluation  06/24/19   PN complete 05/26/19   Authorization Type  Primary: Medicare part A and B secondary: [employer]    Authorization Time Period  02/24/19-04/20/18; 04/18/19-05/20/19    Authorization - Visit Number  7    Authorization - Number of Visits  10    Progress Note Due on Visit  25    PT Start Time  1050    PT Stop Time  1135    PT Time Calculation (min)  45 min    Equipment Utilized During Treatment  Gait belt    Activity Tolerance  Patient tolerated treatment well;Patient limited by fatigue    Behavior During Therapy  WFL for tasks assessed/performed       Past Medical History:  Diagnosis Date  . Atrial fibrillation ([place])   . CVA (cerebral vascular accident) ([place])   . High cholesterol   . Hypertension   . Type 2 diabetes mellitus ([place])     Past Surgical History:  Procedure Laterality Date  . ABDOMINAL HYSTERECTOMY    . TUBAL LIGATION      There were no vitals filed for this visit.  Subjective Assessment - 06/02/19 1103    Subjective  pt states she was unable to go to brace fititng due to death in the family.  States she goes next week.  Only pain she has is in her Lt shoulder.    Currently in Pain?  No/denies                       [facility] Adult PT Treatment/Exercise - 06/02/19 1111      Bed Mobility   Bed Mobility  Supine to Sit;Sit to Supine    Supine to Sit  Contact Guard/Touching assist   with verbal cues for weight shift   Sit to Supine  Minimal Assistance - Patient > 75%   assist with LEs, verbal cues for RT weight shift to  pillow      Transfers   Sit to Stand  4: Min assist   LE      Ambulation/Gait   Ambulation/Gait  Yes    Ambulation/Gait Assistance  4: Min assist    Ambulation Distance (Feet)  140 Feet   70 feet X 2 bouts   Assistive device  Hemi-walker    Gait Pattern  Step-to pattern;Decreased step length - right;Decreased stance time - left;Ataxic;Left flexed knee in stance;Narrow base of support    Gait Comments  Requires mod verbal cues for upright posture, weight shift to advance LLE, and LLE step length, heel strike and to avoid narrow stance       Knee/Hip Exercises: Standing   Stairs  4" 1RT with 1 HR    Rocker Board  2 minutes;Limitations    Rocker Board Limitations  Rt/Lt in parallel bars    Other Standing Knee Exercises  standing marches for improved weightshifting at // bars     Other Standing Knee Exercises  staggered stance 3 x 30" each, min gard for balance and  using hemi walker on RT                PT Short Term Goals - 04/18/19 1111      PT SHORT TERM GOAL #1   Title  Patient will be independent with HEP in order to optimze functional outcomes.    Baseline  Patient verbalized and demos compliance with seated marches, LAQ, hip adduciton squeeze    Time  4    Period  Weeks    Status  Achieved    Target Date  03/24/19        PT Long Term Goals - 05/26/19 1114      PT LONG TERM GOAL #1   Title  Patient will be able to ambulate at least 50 feet with LRAD  to demonstrate improved ability to access the bathroom in her home.    Baseline  Current: 70 feet with hemi walker, min A, frequent VCs for foot placement    Time  8    Period  Weeks    Status  Partially Met      PT LONG TERM GOAL #2   Title  Patient will be able to perform sit to stand with supervision in order to demonstrate improved LE strength.    Baseline  Current: Min A, mod VCs for LLE placmement and weightshifting    Time  8    Period  Weeks    Status  On-going      PT LONG TERM GOAL #3   Title   Patient will report ability to transfer to shower chair for improved ability to perform hygenic activities.    Baseline  Pateint says she does not use this because it is too big, says she prefers sponge bathing    Time  8    Period  Weeks    Status  Deferred      PT LONG TERM GOAL #4   Title  Patient will be able to complete bed mobility independently for improved ease of self care.    Baseline  Able to perform supine to sit with Min guard and VCs, but requires assist for sit to supine (see objective measures)    Time  8    Period  Weeks    Status  On-going            Plan - 06/02/19 1533    Clinical Impression Statement  pt began session with transfer WC to/from toilet with setup required and supervison. Pt able to complete without assist, using bar at toilet.  Pt does, however require assist to pull pants down and up as needs her UE to hold on for balaance.  Pt is unstable at times with difficulty regaining balance without assist.  Added lateral rockerboard to work on weight shifting in parallel bars with min assist and stabilization of hips by therapist to reduce substituion of other mm groups.  Pt required frequent rest breaks throughout and some assistance with Lt LE for clearance as fatigued.  Completed stair negotiatoin in step to fashion and had more difficulty with step down than up.    Personal Factors and Comorbidities  Age;Behavior Pattern;Comorbidity 3+    Comorbidities  CVA, hypertension, diabetes    Examination-Activity Limitations  Bathing;Bed Mobility;Bend;Caring for Others;Carry;Dressing;Hygiene/Grooming;Lift;Locomotion Level;Sit;Squat;Stairs;Stand;Toileting;Transfers    Examination-Participation Restrictions  Cleaning;Community Activity;Laundry;Meal Prep;Shop;Volunteer;Yard Work    Journalist, newspaper    Rehab Potential  Fair    PT Frequency  2x / week  PT Duration  4 weeks    PT Treatment/Interventions  ADLs/Self Care  Home Management;Aquatic Therapy;Biofeedback;Cryotherapy;Iontophoresis 67m/ml Dexamethasone;Electrical Stimulation;Moist Heat;Traction;Ultrasound;DME Instruction;Gait training;Stair training;Functional mobility training;Therapeutic activities;Therapeutic exercise;Balance training;Neuromuscular re-education;Patient/family education;Orthotic Fit/Training;Wheelchair mobility training;Manual techniques;Compression bandaging;Passive range of motion;Dry needling;Energy conservation;Splinting;Taping;Vasopneumatic Device;Spinal Manipulations;Joint Manipulations    PT Next Visit Plan  Continue to progress BLE strength and mobility as able. Progress gait with hemi walker and balance as tolerated.    PT Home Exercise Plan  12/15: seated HS stretch, seated marching with control    Consulted and Agree with Plan of Care  Patient       Patient will benefit from skilled therapeutic intervention in order to improve the following deficits and impairments:  Abnormal gait, Decreased activity tolerance, Decreased balance, Decreased coordination, Decreased endurance, Decreased mobility, Decreased range of motion, Decreased skin integrity, Decreased strength, Difficulty walking, Impaired flexibility, Impaired sensation, Impaired tone, Improper body mechanics, Postural dysfunction, Pain  Visit Diagnosis: Other abnormalities of gait and mobility  Muscle weakness (generalized)  Other symptoms and signs involving the nervous system     Problem List Patient Active Problem List   Diagnosis Date Noted  . Spastic hemiparesis ([place]   . Labile blood glucose   . Acute pain of left shoulder   . AKI (acute kidney injury) ([place]   . Controlled type 2 diabetes mellitus with hyperglycemia, without long-term current use of insulin ([place]   . Dysphagia, post-stroke   . Atrial fibrillation ([place]   . Right middle cerebral artery stroke ([place] 09/27/2018  . Pressure injury of skin 09/21/2018  . Cerebrovascular accident (CVA) with  involvement of left side of body ([place] 09/20/2018  . Atrial fibrillation with RVR ([place] 09/20/2018  . Hypertension   . Rhabdomyolysis   . Type 2 diabetes mellitus ([place]   . Hyperlipidemia   . Hyperbilirubinemia   . Leukocytosis   . Hypernatremia    ATeena Irani PTA/CLT [phone] FTeena Irani3/18/2021, 3:46 PM  [id number] [place] [id number]: [phone]  Fax:  [phone] Name: BJENAVIEVE FREDAMRN: [id number] of Birth: [birth year]

## 2019-06-03 DIAGNOSIS — Z23 Encounter for immunization: Secondary | ICD-10-CM | POA: Diagnosis not present

## 2019-06-07 ENCOUNTER — Other Ambulatory Visit: Payer: Self-pay

## 2019-06-07 ENCOUNTER — Ambulatory Visit (HOSPITAL_COMMUNITY): Payer: Medicare Other

## 2019-06-07 ENCOUNTER — Encounter (HOSPITAL_COMMUNITY): Payer: Self-pay

## 2019-06-07 DIAGNOSIS — R29818 Other symptoms and signs involving the nervous system: Secondary | ICD-10-CM

## 2019-06-07 DIAGNOSIS — R2689 Other abnormalities of gait and mobility: Secondary | ICD-10-CM

## 2019-06-07 DIAGNOSIS — M6281 Muscle weakness (generalized): Secondary | ICD-10-CM | POA: Diagnosis not present

## 2019-06-07 NOTE — Therapy (Signed)
[place], [place], [address] Phone: [phone]   Fax:  [phone]  Physical Therapy Treatment  Patient Details  Name: Rachel Fox MRN: [id number] Date of Birth: February 18, 1946 Referring Provider (PT): Allyn Kenner   Encounter Date: 06/07/2019  PT End of Session - 06/07/19 1347    Visit Number  17    Number of Visits  23    Date for PT Re-Evaluation  06/24/19   PN complete 05/26/19   Authorization Type  Primary: Medicare part A and B secondary: [employer]    Authorization Time Period  02/24/19-04/20/18; 04/18/19-05/20/19    Authorization - Visit Number  8    Authorization - Number of Visits  10    Progress Note Due on Visit  25    PT Start Time  1308    PT Stop Time  1355    PT Time Calculation (min)  47 min    Equipment Utilized During Treatment  Gait belt   hemi-walker   Activity Tolerance  Patient tolerated treatment well;Patient limited by fatigue    Behavior During Therapy  [facility] for tasks assessed/performed       Past Medical History:  Diagnosis Date  . Atrial fibrillation ([place])   . CVA (cerebral vascular accident) ([place])   . High cholesterol   . Hypertension   . Type 2 diabetes mellitus ([place])     Past Surgical History:  Procedure Laterality Date  . ABDOMINAL HYSTERECTOMY    . TUBAL LIGATION      There were no vitals filed for this visit.  Subjective Assessment - 06/07/19 1307    Subjective  Pt stated she is getting brace on the 26th.  No reports of pain currently,    Pertinent History  CVA, hypertension, diabetes    Patient Stated Goals  to improve balance and gait    Currently in Pain?  No/denies                       [facility] Adult PT Treatment/Exercise - 06/07/19 0001      Ambulation/Gait   Ambulation/Gait  Yes    Ambulation/Gait Assistance  4: Min assist    Ambulation Distance (Feet)  200 Feet   84 then 116 ft   Assistive device  Hemi-walker    Gait Pattern  Step-to  pattern;Decreased step length - right;Decreased stance time - left;Ataxic;Left flexed knee in stance;Narrow base of support    Stairs  Yes    Stair Management Technique  One rail Right;Step to pattern    Number of Stairs  7    Height of Stairs  4    Gait Comments  Requires mod verbal cues for upright posture, weight shift to advance LLE, and LLE step length, heel strike and to avoid narrow stance       Knee/Hip Exercises: Standing   Stairs  4" 1RT with 1 HR    Rocker Board  2 minutes;Limitations    Rocker Board Limitations  Rt/Lt in parallel bars    Other Standing Knee Exercises  standing marches for improved weightshifting at // bars     Other Standing Knee Exercises  Rt foot on 4in partial tandem stance 3x 20-30" without HHA             PT Education - 06/07/19 1734    Education Details  Pt educated on brain injury support group in [place].    Person(s) Educated  Patient    Methods  Explanation    Comprehension  Verbalized understanding       PT Short Term Goals - 04/18/19 1111      PT SHORT TERM GOAL #1   Title  Patient will be independent with HEP in order to optimze functional outcomes.    Baseline  Patient verbalized and demos compliance with seated marches, LAQ, hip adduciton squeeze    Time  4    Period  Weeks    Status  Achieved    Target Date  03/24/19        PT Long Term Goals - 05/26/19 1114      PT LONG TERM GOAL #1   Title  Patient will be able to ambulate at least 50 feet with LRAD  to demonstrate improved ability to access the bathroom in her home.    Baseline  Current: 70 feet with hemi walker, min A, frequent VCs for foot placement    Time  8    Period  Weeks    Status  Partially Met      PT LONG TERM GOAL #2   Title  Patient will be able to perform sit to stand with supervision in order to demonstrate improved LE strength.    Baseline  Current: Min A, mod VCs for LLE placmement and weightshifting    Time  8    Period  Weeks     Status  On-going      PT LONG TERM GOAL #3   Title  Patient will report ability to transfer to shower chair for improved ability to perform hygenic activities.    Baseline  Pateint says she does not use this because it is too big, says she prefers sponge bathing    Time  8    Period  Weeks    Status  Deferred      PT LONG TERM GOAL #4   Title  Patient will be able to complete bed mobility independently for improved ease of self care.    Baseline  Able to perform supine to sit with Min guard and VCs, but requires assist for sit to supine (see objective measures)    Time  8    Period  Weeks    Status  On-going            Plan - 06/07/19 1725    Clinical Impression Statement  Began session assisting with SPT to/from toilet with min A required.  Pt able to complete with only assistance upon standing to assist with pulling down/up pants and cueing to hold onto bar to assist wiht balance.  Able to increase distance wiht gait training today, cueing to increase step length with Rt LE.  Pt presents with deacreased weight bearing Lt LE stance phase during gait.  Continues rockerboard to work on weight shifting as well as began tandem stance with Rt foot onto step, pt able to complete standing for 10-20" prior need for HHA.  Pt reports she will receive AFO brace for Lt LE on 06/10/19.  Pt presesnts iwht increased diffuclty with stair training to clear toes onto next session.  Most dfficulty going down vs stepping up.  Pt was limited by fatigue at EOS, no reports of pain.    Personal Factors and Comorbidities  Age;Behavior Pattern;Comorbidity 3+    Comorbidities  CVA, hypertension, diabetes    Examination-Activity Limitations  Bathing;Bed Mobility;Bend;Caring for Others;Carry;Dressing;Hygiene/Grooming;Lift;Locomotion Level;Sit;Squat;Stairs;Stand;Toileting;Transfers    Examination-Participation Restrictions  Cleaning;Community Activity;Laundry;Meal Prep;Shop;Volunteer;[employer] Work  Stability/Clinical  Decision Making  Evolving/Moderate complexity    Clinical Decision Making  Moderate    Rehab Potential  Fair    PT Frequency  2x / week    PT Duration  4 weeks    PT Treatment/Interventions  ADLs/Self Care Home Management;Aquatic Therapy;Biofeedback;Cryotherapy;Iontophoresis '4mg'$ /ml Dexamethasone;Electrical Stimulation;Moist Heat;Traction;Ultrasound;DME Instruction;Gait training;Stair training;Functional mobility training;Therapeutic activities;Therapeutic exercise;Balance training;Neuromuscular re-education;Patient/family education;Orthotic Fit/Training;Wheelchair mobility training;Manual techniques;Compression bandaging;Passive range of motion;Dry needling;Energy conservation;Splinting;Taping;Vasopneumatic Device;Spinal Manipulations;Joint Manipulations    PT Next Visit Plan  Continue to progress BLE strength and mobility as able. Progress gait with hemi walker and balance as tolerated.    PT Home Exercise Plan  12/15: seated HS stretch, seated marching with control       Patient will benefit from skilled therapeutic intervention in order to improve the following deficits and impairments:  Abnormal gait, Decreased activity tolerance, Decreased balance, Decreased coordination, Decreased endurance, Decreased mobility, Decreased range of motion, Decreased skin integrity, Decreased strength, Difficulty walking, Impaired flexibility, Impaired sensation, Impaired tone, Improper body mechanics, Postural dysfunction, Pain  Visit Diagnosis: Other symptoms and signs involving the nervous system  Muscle weakness (generalized)  Other abnormalities of gait and mobility     Problem List Patient Active Problem List   Diagnosis Date Noted  . Spastic hemiparesis ([place])   . Labile blood glucose   . Acute pain of left shoulder   . AKI (acute kidney injury) ([place])   . Controlled type 2 diabetes mellitus with hyperglycemia, without long-term current use of insulin ([place])   . Dysphagia, post-stroke   .  Atrial fibrillation ([place])   . Right middle cerebral artery stroke ([place]) 09/27/2018  . Pressure injury of skin 09/21/2018  . Cerebrovascular accident (CVA) with involvement of left side of body ([place]) 09/20/2018  . Atrial fibrillation with RVR ([place]) 09/20/2018  . Hypertension   . Rhabdomyolysis   . Type 2 diabetes mellitus ([place])   . Hyperlipidemia   . Hyperbilirubinemia   . Leukocytosis   . Hypernatremia    Ihor Austin, LPTA/CLT; CBIS [phone]  Aldona Lento 06/07/2019, 6:52 PM  [place] [address] [place], [place], [address] Phone: [phone]   Fax:  [phone]  Name: Rachel Fox MRN: [id number] Date of Birth: [birth year]

## 2019-06-09 ENCOUNTER — Ambulatory Visit (HOSPITAL_COMMUNITY): Payer: Medicare Other

## 2019-06-09 ENCOUNTER — Other Ambulatory Visit: Payer: Self-pay

## 2019-06-09 ENCOUNTER — Encounter (HOSPITAL_COMMUNITY): Payer: Self-pay

## 2019-06-09 DIAGNOSIS — R29818 Other symptoms and signs involving the nervous system: Secondary | ICD-10-CM | POA: Diagnosis not present

## 2019-06-09 DIAGNOSIS — M6281 Muscle weakness (generalized): Secondary | ICD-10-CM

## 2019-06-09 DIAGNOSIS — R2689 Other abnormalities of gait and mobility: Secondary | ICD-10-CM | POA: Diagnosis not present

## 2019-06-09 NOTE — Therapy (Signed)
[place], [place], [address] Phone: [phone]   Fax:  [phone]  Physical Therapy Treatment  Patient Details  Name: Rachel Fox MRN: [id number] Date of Birth: Aug 23, 1945 Referring Provider (PT): Allyn Kenner   Encounter Date: 06/09/2019  PT End of Session - 06/09/19 1037    Visit Number  18    Number of Visits  23    Date for PT Re-Evaluation  06/24/19   PN noted complete 05/26/19   Authorization Type  Primary: Medicare part A and B secondary: [employer]    Authorization Time Period  02/24/19-04/20/18; 04/18/19-05/20/19    Authorization - Visit Number  9    Authorization - Number of Visits  10    Progress Note Due on Visit  25    PT Start Time  1030    PT Stop Time  1114    PT Time Calculation (min)  44 min    Equipment Utilized During Treatment  Gait belt   hemiwalker   Activity Tolerance  Patient tolerated treatment well;Patient limited by fatigue    Behavior During Therapy  WFL for tasks assessed/performed       Past Medical History:  Diagnosis Date  . Atrial fibrillation ([place])   . CVA (cerebral vascular accident) ([place])   . High cholesterol   . Hypertension   . Type 2 diabetes mellitus ([place])     Past Surgical History:  Procedure Laterality Date  . ABDOMINAL HYSTERECTOMY    . TUBAL LIGATION      There were no vitals filed for this visit.  Subjective Assessment - 06/09/19 1035    Subjective  Pt stated her knee is a little sore today following the steps on Tuesday.    Pertinent History  CVA, hypertension, diabetes    Patient Stated Goals  to improve balance and gait    Currently in Pain?  Yes    Pain Score  3     Pain Location  Knee    Pain Orientation  Left    Pain Descriptors / Indicators  Aching;Sore    Pain Type  Chronic pain    Pain Onset  More than a month ago    Pain Frequency  Intermittent    Aggravating Factors   unsure    Pain Relieving Factors  nothing    Effect of Pain on  Daily Activities  limited tolerance to movements of LUE                       [facility] Adult PT Treatment/Exercise - 06/09/19 0001      Transfers   Transfers  Sit to Stand;Stand Pivot Transfers    Sit to Stand  4: Min assist;4: Min guard    Sit to Stand Details (indicate cue type and reason)  Required assistance pulling pants down/up standing in restroom, used arm bar beside toilet    Stand to Sit  4: Min assist    Stand to Sit Details (indicate cue type and reason)  Tactile cues for weight shifting    Stand Pivot Transfers  4: Min assist      Ambulation/Gait   Ambulation/Gait  Yes    Ambulation/Gait Assistance  4: Min assist    Ambulation Distance (Feet)  200 Feet   142f then 945fwiht 1 seated rest break   Assistive device  Hemi-walker    Gait Pattern  Step-to pattern;Decreased step length - right;Decreased stance time -  left;Ataxic;Left flexed knee in stance;Narrow base of support      Knee/Hip Exercises: Standing   Forward Step Up  Both;5 reps;Hand Hold: 1;Step Height: 4"    Other Standing Knee Exercises  standing marches for improved weightshifting at // bars     Other Standing Knee Exercises  Rt foot on 4in partial tandem stance 3x 20-30" without HHA; forward/backwards inside // bars 2RT, sidestep 2RT               PT Short Term Goals - 04/18/19 1111      PT SHORT TERM GOAL #1   Title  Patient will be independent with HEP in order to optimze functional outcomes.    Baseline  Patient verbalized and demos compliance with seated marches, LAQ, hip adduciton squeeze    Time  4    Period  Weeks    Status  Achieved    Target Date  03/24/19        PT Long Term Goals - 05/26/19 1114      PT LONG TERM GOAL #1   Title  Patient will be able to ambulate at least 50 feet with LRAD  to demonstrate improved ability to access the bathroom in her home.    Baseline  Current: 70 feet with hemi walker, min A, frequent VCs for foot placement    Time  8    Period   Weeks    Status  Partially Met      PT LONG TERM GOAL #2   Title  Patient will be able to perform sit to stand with supervision in order to demonstrate improved LE strength.    Baseline  Current: Min A, mod VCs for LLE placmement and weightshifting    Time  8    Period  Weeks    Status  On-going      PT LONG TERM GOAL #3   Title  Patient will report ability to transfer to shower chair for improved ability to perform hygenic activities.    Baseline  Pateint says she does not use this because it is too big, says she prefers sponge bathing    Time  8    Period  Weeks    Status  Deferred      PT LONG TERM GOAL #4   Title  Patient will be able to complete bed mobility independently for improved ease of self care.    Baseline  Able to perform supine to sit with Min guard and VCs, but requires assist for sit to supine (see objective measures)    Time  8    Period  Weeks    Status  On-going            Plan - 06/09/19 1253    Clinical Impression Statement  Began session assisting wiht SPT to/from toilet with min A required, pt able to stand without assistance while holding onto bar by toilet as therapist pulled pants up and down.  Continued gait training to improve mechanics wiht hemiwalker and cueing to increase step length with Rt LE to assist with stance phase Lt LE.  Pt will benefit from AFO to reduce foot drop for safety with gait, has apt scheduled for tomorrow.  Pt presents wiht improved activity tolerance with ability to ambulate further distance prior rest break required.  Continued functional strengthening and balance training with min A.  No reports of increased pain, was limited by fatigue.    Personal Factors and Comorbidities  Age;Behavior  Pattern;Comorbidity 3+    Comorbidities  CVA, hypertension, diabetes    Examination-Activity Limitations  Bathing;Bed Mobility;Bend;Caring for Others;Carry;Dressing;Hygiene/Grooming;Lift;Locomotion  Level;Sit;Squat;Stairs;Stand;Toileting;Transfers    Examination-Participation Restrictions  Cleaning;Community Activity;Laundry;Meal Prep;Shop;Volunteer;Yard Work    Stability/Clinical Decision Making  Evolving/Moderate complexity    Clinical Decision Making  Moderate    PT Frequency  2x / week    PT Duration  4 weeks    PT Treatment/Interventions  ADLs/Self Care Home Management;Aquatic Therapy;Biofeedback;Cryotherapy;Iontophoresis '4mg'$ /ml Dexamethasone;Electrical Stimulation;Moist Heat;Traction;Ultrasound;DME Instruction;Gait training;Stair training;Functional mobility training;Therapeutic activities;Therapeutic exercise;Balance training;Neuromuscular re-education;Patient/family education;Orthotic Fit/Training;Wheelchair mobility training;Manual techniques;Compression bandaging;Passive range of motion;Dry needling;Energy conservation;Splinting;Taping;Vasopneumatic Device;Spinal Manipulations;Joint Manipulations    PT Next Visit Plan  Continue to progress BLE strength and mobility as able. Progress gait with hemi walker and balance as tolerated.    PT Home Exercise Plan  12/15: seated HS stretch, seated marching with control       Patient will benefit from skilled therapeutic intervention in order to improve the following deficits and impairments:  Abnormal gait, Decreased activity tolerance, Decreased balance, Decreased coordination, Decreased endurance, Decreased mobility, Decreased range of motion, Decreased skin integrity, Decreased strength, Difficulty walking, Impaired flexibility, Impaired sensation, Impaired tone, Improper body mechanics, Postural dysfunction, Pain  Visit Diagnosis: Other symptoms and signs involving the nervous system  Muscle weakness (generalized)  Other abnormalities of gait and mobility     Problem List Patient Active Problem List   Diagnosis Date Noted  . Spastic hemiparesis ([place])   . Labile blood glucose   . Acute pain of left shoulder   . AKI (acute kidney  injury) ([place])   . Controlled type 2 diabetes mellitus with hyperglycemia, without long-term current use of insulin ([place])   . Dysphagia, post-stroke   . Atrial fibrillation ([place])   . Right middle cerebral artery stroke ([place]) 09/27/2018  . Pressure injury of skin 09/21/2018  . Cerebrovascular accident (CVA) with involvement of left side of body ([place]) 09/20/2018  . Atrial fibrillation with RVR ([place]) 09/20/2018  . Hypertension   . Rhabdomyolysis   . Type 2 diabetes mellitus ([place])   . Hyperlipidemia   . Hyperbilirubinemia   . Leukocytosis   . Hypernatremia    Ihor Austin, LPTA/CLT; CBIS [phone]  Aldona Lento 06/09/2019, 1:00 PM  [facility] [address] [place], [place], [address] Phone: [phone]   Fax:  [phone]  Name: Rachel Fox MRN: [id number] Date of Birth: 1945/09/15

## 2019-06-14 ENCOUNTER — Telehealth (HOSPITAL_COMMUNITY): Payer: Self-pay

## 2019-06-14 ENCOUNTER — Ambulatory Visit (HOSPITAL_COMMUNITY): Payer: Medicare Other

## 2019-06-14 NOTE — Telephone Encounter (Signed)
pt cancelled appt because of transportation issues

## 2019-06-15 DIAGNOSIS — Z111 Encounter for screening for respiratory tuberculosis: Secondary | ICD-10-CM | POA: Diagnosis not present

## 2019-06-15 DIAGNOSIS — E1165 Type 2 diabetes mellitus with hyperglycemia: Secondary | ICD-10-CM | POA: Diagnosis not present

## 2019-06-15 DIAGNOSIS — I10 Essential (primary) hypertension: Secondary | ICD-10-CM | POA: Diagnosis not present

## 2019-06-15 DIAGNOSIS — E7849 Other hyperlipidemia: Secondary | ICD-10-CM | POA: Diagnosis not present

## 2019-06-16 ENCOUNTER — Ambulatory Visit (HOSPITAL_COMMUNITY): Payer: Medicare Other

## 2019-06-20 DIAGNOSIS — E43 Unspecified severe protein-calorie malnutrition: Secondary | ICD-10-CM | POA: Diagnosis not present

## 2019-06-20 DIAGNOSIS — I69354 Hemiplegia and hemiparesis following cerebral infarction affecting left non-dominant side: Secondary | ICD-10-CM | POA: Diagnosis not present

## 2019-06-20 DIAGNOSIS — K219 Gastro-esophageal reflux disease without esophagitis: Secondary | ICD-10-CM | POA: Diagnosis not present

## 2019-06-20 DIAGNOSIS — N3281 Overactive bladder: Secondary | ICD-10-CM | POA: Diagnosis not present

## 2019-06-20 DIAGNOSIS — I480 Paroxysmal atrial fibrillation: Secondary | ICD-10-CM | POA: Diagnosis not present

## 2019-06-21 ENCOUNTER — Ambulatory Visit (HOSPITAL_COMMUNITY): Payer: Medicare Other | Attending: Internal Medicine | Admitting: Physical Therapy

## 2019-06-21 ENCOUNTER — Encounter (HOSPITAL_COMMUNITY): Payer: Self-pay | Admitting: Physical Therapy

## 2019-06-21 ENCOUNTER — Other Ambulatory Visit: Payer: Self-pay

## 2019-06-21 DIAGNOSIS — M6281 Muscle weakness (generalized): Secondary | ICD-10-CM

## 2019-06-21 DIAGNOSIS — R29818 Other symptoms and signs involving the nervous system: Secondary | ICD-10-CM

## 2019-06-21 DIAGNOSIS — R2689 Other abnormalities of gait and mobility: Secondary | ICD-10-CM | POA: Diagnosis not present

## 2019-06-21 NOTE — Therapy (Signed)
[name], [place], [address] Phone: [phone]   Fax:  [phone]  Physical Therapy Treatment/ Progress Note  Patient Details  Name: Rachel Fox MRN: [id number] Date of Birth: [birth year] Referring Provider (PT): Allyn Kenner   Encounter Date: 06/21/2019   Progress Note Reporting Period 05/26/19 to 06/21/19  See note below for Objective Data and Assessment of Progress/Goals.       PT End of Session - 06/21/19 0946    Visit Number  19    Number of Visits  27    Date for PT Re-Evaluation  07/22/19    Authorization Type  Primary: Medicare part A and B secondary: [place] Time Period  02/24/19-04/20/18; 04/18/19-05/20/19; 05/26/19-06/24/19; 06/21/19-07/22/19    Authorization - Visit Number  --    Authorization - Number of Visits  --    Progress Note Due on Visit  29    PT Start Time  0940    PT Stop Time  1015    PT Time Calculation (min)  35 min    Equipment Utilized During Treatment  Gait belt   hemiwalker   Activity Tolerance  Patient tolerated treatment well;Patient limited by fatigue    Behavior During Therapy  WFL for tasks assessed/performed       Past Medical History:  Diagnosis Date  . Atrial fibrillation ([place])   . CVA (cerebral vascular accident) ([place])   . High cholesterol   . Hypertension   . Type 2 diabetes mellitus ([place])     Past Surgical History:  Procedure Laterality Date  . ABDOMINAL HYSTERECTOMY    . TUBAL LIGATION      There were no vitals filed for this visit.  Subjective Assessment - 06/21/19 0946    Subjective  Patient says her she recently moved in with her son and daughter in law. Says they have been helping her lately. Says she gets her boot on tomorrow.    Pertinent History  CVA, hypertension, diabetes    Patient Stated Goals  to improve balance and gait    Currently in Pain?  No/denies    Pain Onset  More than a month ago         [facility] PT Assessment -  06/21/19 0001      Assessment   Medical Diagnosis  LUE spastic hemiplegia s/p CVA    Referring Provider (PT)  Allyn Kenner    Onset Date/Surgical Date  09/18/18    Hand Dominance  Right    Prior Therapy  Acute OT, CIR, HHOT      Precautions   Precautions  Fall      Restrictions   Weight Bearing Restrictions  No      Home Environment   Living Environment  Private residence    Living Arrangements  Children;Other (Comment)    Available Help at Discharge  Family      Prior Function   Level of Independence  Independent      Cognition   Overall Cognitive Status  Within Functional Limits for tasks assessed      Sensation   Light Touch  Impaired by gross assessment      Strength   Overall Strength Comments  Tested seated in wheelchair    Right Hip Flexion  4+/5    Right Hip Extension  4+/5    Right Hip ABduction  4+/5    Left Hip Flexion  3-/5    Left Hip  Extension  4/5    Left Hip ABduction  3+/5    Right Knee Flexion  4+/5    Right Knee Extension  5/5    Left Knee Flexion  3-/5    Left Knee Extension  4/5   tested in available ROM      Bed Mobility   Bed Mobility  Rolling Right;Rolling Left;Sit to Supine;Supine to Sit    Rolling Right  Independent;Contact Guard/Touching assist    Rolling Left  Supervision/Verbal cueing;Independent    Supine to Sit  Supervision/Verbal cueing    Sit to Supine  Supervision/Verbal cueing      Transfers   Sit to Stand  4: Min assist    Stand to Sit  5: Supervision    Stand Pivot Transfers  4: Min guard      Ambulation/Gait   Ambulation/Gait  Yes    Ambulation/Gait Assistance  4: Min guard    Ambulation Distance (Feet)  80 Feet   pateint required rest break due to fatigue and feeling hot    Assistive device  Hemi-walker    Gait Pattern  Step-to pattern;Decreased step length - right;Decreased stance time - left;Ataxic;Left flexed knee in stance;Narrow base of support                           PT Education -  06/21/19 0949    Education Details  on reassessment findings, and POC    Person(s) Educated  Patient    Methods  Explanation    Comprehension  Verbalized understanding       PT Short Term Goals - 04/18/19 1111      PT SHORT TERM GOAL #1   Title  Patient will be independent with HEP in order to optimze functional outcomes.    Baseline  Patient verbalized and demos compliance with seated marches, LAQ, hip adduciton squeeze    Time  4    Period  Weeks    Status  Achieved    Target Date  03/24/19        PT Long Term Goals - 06/21/19 1024      PT LONG TERM GOAL #1   Title  Patient will be able to ambulate at least 50 feet with LRAD  to demonstrate improved ability to access the bathroom in her home.    Baseline  Current: 80 feet with hemi walker, min verbal cues for ground clearance on LT, LOB x 1    Time  8    Period  Weeks    Status  Partially Met      PT LONG TERM GOAL #2   Title  Patient will be able to perform sit to stand with supervision in order to demonstrate improved LE strength.    Baseline  Current: Min A, min VCs for LLE placmement and weightshifting    Time  8    Period  Weeks    Status  On-going      PT LONG TERM GOAL #3   Title  Patient will report ability to transfer to shower chair for improved ability to perform hygenic activities.    Baseline  Patient says she does not use this because it is too big, says she prefers sponge bathing    Time  8    Period  Weeks    Status  Deferred      PT LONG TERM GOAL #4   Title  Patient will be able to complete  bed mobility independently for improved ease of self care.    Baseline  Able to perform all bed mobility Mod I and with min VC for hand and leg placement    Time  8    Period  Weeks    Status  Partially Met            Plan - 06/21/19 1035    Clinical Impression Statement  Patient demos good progress toward therapy goals, despite 3 x attendance since last reassessment due to transportation issues.  Patient has since moved in with her son and daughter in law, whom she says has been helping her with ADLs and functional mobility around the house. Patient demos improved strength at todays assessment, and improved return with functional mobility transfers. Patient was able to perform all bed mobility IND with minimal verbal cues, and needs less cueing for set up with sit to stand, as well as gait training. Patient does still require Min A for initial sit to stand to get momentum, and still demos occasional LOB with gait training due to poor LT foot clearance. This is exacerbated when she is fatigued, and will require cues for pacing activity and lifting LT hip for proper clearance due to LT foot drop. Patient is scheduled to be fitted with LT AFO tomorrow, which will likely help with this, and allow patient to progress gait training and weight bearing functional activity. Patient will continue to benefit from skilled therapy services to progress gait training, functional strength and balance to improve ability to perform ADLs and reduce risk for suture falls.    Personal Factors and Comorbidities  Age;Behavior Pattern;Comorbidity 3+    Comorbidities  CVA, hypertension, diabetes    Examination-Activity Limitations  Bathing;Bed Mobility;Bend;Caring for Others;Carry;Dressing;Hygiene/Grooming;Lift;Locomotion Level;Sit;Squat;Stairs;Stand;Toileting;Transfers    Examination-Participation Restrictions  Cleaning;Community Activity;Laundry;Meal Prep;Shop;Volunteer;Yard Work    [employer]chant [employer] officer  Evolving/Moderate complexity    PT Frequency  2x / week    PT Duration  4 weeks    PT Treatment/Interventions  ADLs/Self Care Home Management;Aquatic Therapy;Biofeedback;Cryotherapy;Iontophoresis '4mg'$ /ml Dexamethasone;Electrical Stimulation;Moist Heat;Traction;Ultrasound;DME Instruction;Gait training;Stair training;Functional mobility training;Therapeutic activities;Therapeutic exercise;Balance  training;Neuromuscular re-education;Patient/family education;Orthotic Fit/Training;Wheelchair mobility training;Manual techniques;Compression bandaging;Passive range of motion;Dry needling;Energy conservation;Splinting;Taping;Vasopneumatic Device;Spinal Manipulations;Joint Manipulations    PT Next Visit Plan  [place]gres gait training with AFO, dynamic balance in seated, static balance and progress to dynamaic balance/ steps/ obstacles as tolerated. Add standing hip abduciton as well next visit.    PT Home Exercise Plan  12/15: seated HS stretch, seated marching with control       Patient will benefit from skilled therapeutic intervention in order to improve the following deficits and impairments:  Abnormal gait, Decreased activity tolerance, Decreased balance, Decreased coordination, Decreased endurance, Decreased mobility, Decreased range of motion, Decreased skin integrity, Decreased strength, Difficulty walking, Impaired flexibility, Impaired sensation, Impaired tone, Improper body mechanics, Postural dysfunction, Pain  Visit Diagnosis: Other symptoms and signs involving the nervous system  Muscle weakness (generalized)  Other abnormalities of gait and mobility     Problem List Patient Active Problem List   Diagnosis Date Noted  . Spastic hemiparesis ([place])   . Labile blood glucose   . Acute pain of left shoulder   . AKI (acute kidney injury) ([place])   . Controlled type 2 diabetes mellitus with hyperglycemia, without long-term current use of insulin ([place])   . Dysphagia, post-stroke   . Atrial fibrillation ([place])   . Right middle cerebral artery stroke ([place]) 09/27/2018  . Pressure injury of skin 09/21/2018  . Cerebrovascular  accident (CVA) with involvement of left side of body ([place]) 09/20/2018  . Atrial fibrillation with RVR ([place]) 09/20/2018  . Hypertension   . Rhabdomyolysis   . Type 2 diabetes mellitus ([place])   . Hyperlipidemia   . Hyperbilirubinemia   . Leukocytosis   .  Hypernatremia    10:44 AM, 06/21/19 Josue Hector PT DPT  Physical Therapist with [facility]  [phone] [place] [address] [place], [place], [address] Phone: [phone]   Fax:  [phone]  Name: VIRIGINIA AMENDOLA MRN: [id number] Date of Birth: 1946-01-31

## 2019-06-23 ENCOUNTER — Other Ambulatory Visit: Payer: Self-pay

## 2019-06-23 ENCOUNTER — Ambulatory Visit (HOSPITAL_COMMUNITY): Payer: Medicare Other

## 2019-06-23 ENCOUNTER — Encounter (HOSPITAL_COMMUNITY): Payer: Self-pay

## 2019-06-23 VITALS — BP 166/80 | HR 58

## 2019-06-23 DIAGNOSIS — R29818 Other symptoms and signs involving the nervous system: Secondary | ICD-10-CM | POA: Diagnosis not present

## 2019-06-23 DIAGNOSIS — R2689 Other abnormalities of gait and mobility: Secondary | ICD-10-CM

## 2019-06-23 DIAGNOSIS — M6281 Muscle weakness (generalized): Secondary | ICD-10-CM

## 2019-06-23 NOTE — Therapy (Signed)
[place], [place], [address] Phone: [phone]   Fax:  [phone]  Physical Therapy Treatment  Patient Details  Name: Rachel Fox MRN: [id number] Date of Birth: [birth year] Referring Provider (PT): Allyn Kenner   Encounter Date: 06/23/2019  PT End of Session - 06/23/19 1033    Visit Number  20    Number of Visits  27    Date for PT Re-Evaluation  07/22/19    Authorization Type  Primary: Medicare part A and B secondary: [place] Time Period  02/24/19-04/20/18; 04/18/19-05/20/19; 05/26/19-06/24/19; 06/21/19-07/22/19    Authorization - Visit Number  1    Authorization - Number of Visits  10    Progress Note Due on Visit  29    PT Start Time  1030    PT Stop Time  1123    PT Time Calculation (min)  53 min    Equipment Utilized During Treatment  Gait belt   hemiwalker   Activity Tolerance  Patient tolerated treatment well;Patient limited by fatigue    Behavior During Therapy  WFL for tasks assessed/performed       Past Medical History:  Diagnosis Date  . Atrial fibrillation ([place])   . CVA (cerebral vascular accident) ([place])   . High cholesterol   . Hypertension   . Type 2 diabetes mellitus ([place])     Past Surgical History:  Procedure Laterality Date  . ABDOMINAL HYSTERECTOMY    . TUBAL LIGATION      Vitals:   06/23/19 1141  BP: (!) 166/80  Pulse: (!) 58    Subjective Assessment - 06/23/19 1033    Subjective  Pt arrived wiht grin on her face, states she got new AFO and excited to try out walking with.  Reports she felt hot and dizzy for a couple of seconds earlier.  Reports medication for bladder has no changes with urination, does make her sleepy.    Patient Stated Goals  to improve balance and gait    Currently in Pain?  No/denies                       [facility] Adult PT Treatment/Exercise - 06/23/19 0001      Transfers   Transfers  Sit to Stand    Sit to Stand  4: Min  assist      Ambulation/Gait   Ambulation/Gait  Yes    Ambulation/Gait Assistance  4: Min guard    Ambulation Distance (Feet)  200 Feet   187f then 349f  Assistive device  Hemi-walker    Gait Pattern  Step-to pattern;Decreased step length - right;Decreased stance time - left;Ataxic;Left flexed knee in stance;Narrow base of support    Stair Management Technique  One rail Right;Step to pattern    Number of Stairs  7    Height of Stairs  4      Knee/Hip Exercises: Standing   Functional Squat  10 reps    Other Standing Knee Exercises  sidestep 2RT front of mat      Knee/Hip Exercises: Seated   Marching  Both;20 reps    Marching Limitations  over 1in hurdles               PT Short Term Goals - 04/18/19 1111      PT SHORT TERM GOAL #1   Title  Patient will be independent with HEP in order to optimze functional outcomes.  Baseline  Patient verbalized and demos compliance with seated marches, LAQ, hip adduciton squeeze    Time  4    Period  Weeks    Status  Achieved    Target Date  03/24/19        PT Long Term Goals - 06/21/19 1024      PT LONG TERM GOAL #1   Title  Patient will be able to ambulate at least 50 feet with LRAD  to demonstrate improved ability to access the bathroom in her home.    Baseline  Current: 80 feet with hemi walker, min verbal cues for ground clearance on LT, LOB x 1    Time  8    Period  Weeks    Status  Partially Met      PT LONG TERM GOAL #2   Title  Patient will be able to perform sit to stand with supervision in order to demonstrate improved LE strength.    Baseline  Current: Min A, min VCs for LLE placmement and weightshifting    Time  8    Period  Weeks    Status  On-going      PT LONG TERM GOAL #3   Title  Patient will report ability to transfer to shower chair for improved ability to perform hygenic activities.    Baseline  Patient says she does not use this because it is too big, says she prefers sponge bathing    Time  8     Period  Weeks    Status  Deferred      PT LONG TERM GOAL #4   Title  Patient will be able to complete bed mobility independently for improved ease of self care.    Baseline  Able to perform all bed mobility Mod I and with min VC for hand and leg placement    Time  8    Period  Weeks    Status  Partially Met            Plan - 06/23/19 1147    Clinical Impression Statement  Pt arrived wiht new AFO with vast improvements noted during gait and increased ease with stair navigation.  Reviewed appropriate wear time and encouraged pt to observe skin integrity upon removal of brace.  Session focus on improving gait mechanics, balance training and LE strengthening.  Added squats and seated marching over 1in bar for hamstring, gluteal and hip flexion strengthening.  Pt was limited by fatigue wiht activities required periodic seated rest breaks through session.  Assistance required during restroom break just to pull down/up pants, good static standing balance noted wiht use of handrails.  Pt did c/o heat and dizziness earlier today, BP taken with 166/80 mmHg and HR at 58.  Encouraged pt to discuss medications with MD next apt as reports no change wiht newest bladder medication, reports same intensity wiht need to urinate and makes sleepy.    Personal Factors and Comorbidities  Age;Behavior Pattern;Comorbidity 3+    Comorbidities  CVA, hypertension, diabetes    Examination-Activity Limitations  Bathing;Bed Mobility;Bend;Caring for Others;Carry;Dressing;Hygiene/Grooming;Lift;Locomotion Level;Sit;Squat;Stairs;Stand;Toileting;Transfers    Examination-Participation Restrictions  Cleaning;Community Activity;Laundry;Meal Prep;Shop;Volunteer;Yard Work    [employer]chant [employer] officer  Evolving/Moderate complexity    Clinical Decision Making  Moderate    Rehab Potential  Fair    PT Frequency  2x / week    PT Duration  4 weeks    PT Treatment/Interventions  ADLs/Self Care Home Management;Aquatic  Therapy;Biofeedback;Cryotherapy;Iontophoresis '4mg'$ /ml Dexamethasone;Electrical Stimulation;Moist Heat;Traction;Ultrasound;DME Instruction;Gait  training;Stair training;Functional mobility training;Therapeutic activities;Therapeutic exercise;Balance training;Neuromuscular re-education;Patient/family education;Orthotic Fit/Training;Wheelchair mobility training;Manual techniques;Compression bandaging;Passive range of motion;Dry needling;Energy conservation;Splinting;Taping;Vasopneumatic Device;Spinal Manipulations;Joint Manipulations    PT Next Visit Plan  [place]gres gait training with AFO, dynamic balance in seated, static balance and progress to dynamaic balance/ steps/ obstacles as tolerated. Add standing hip abduciton as well next visit.    PT Home Exercise Plan  12/15: seated HS stretch, seated marching with control       Patient will benefit from skilled therapeutic intervention in order to improve the following deficits and impairments:  Abnormal gait, Decreased activity tolerance, Decreased balance, Decreased coordination, Decreased endurance, Decreased mobility, Decreased range of motion, Decreased skin integrity, Decreased strength, Difficulty walking, Impaired flexibility, Impaired sensation, Impaired tone, Improper body mechanics, Postural dysfunction, Pain  Visit Diagnosis: Muscle weakness (generalized)  Other symptoms and signs involving the nervous system  Other abnormalities of gait and mobility     Problem List Patient Active Problem List   Diagnosis Date Noted  . Spastic hemiparesis ([place])   . Labile blood glucose   . Acute pain of left shoulder   . AKI (acute kidney injury) ([place])   . Controlled type 2 diabetes mellitus with hyperglycemia, without long-term current use of insulin ([place])   . Dysphagia, post-stroke   . Atrial fibrillation ([place])   . 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 Pressure injury of skin 09/21/2018  . Cerebrovascular accident (CVA) with  involvement of left side of body ([place]) 09/20/2018  . Atrial fibrillation with RVR ([place]) 09/20/2018  . Hypertension   . Rhabdomyolysis   . Type 2 diabetes mellitus ([place])   . Hyperlipidemia   . Hyperbilirubinemia   . Leukocytosis   . Hypernatremia    Ihor Austin, LPTA/CLT; CBIS [phone]  Aldona Lento 06/23/2019, 1:00 PM  [facility] [address] [place], [place], [address] Phone: [phone]   Fax:  [phone]  Name: Rachel Fox MRN: [id number] Date of Birth: 15-Aug-1945

## 2019-06-28 ENCOUNTER — Encounter (HOSPITAL_COMMUNITY): Payer: Self-pay | Admitting: Physical Therapy

## 2019-06-28 ENCOUNTER — Ambulatory Visit (HOSPITAL_COMMUNITY): Payer: Medicare Other | Admitting: Physical Therapy

## 2019-06-28 ENCOUNTER — Other Ambulatory Visit: Payer: Self-pay

## 2019-06-28 DIAGNOSIS — R29818 Other symptoms and signs involving the nervous system: Secondary | ICD-10-CM

## 2019-06-28 DIAGNOSIS — M6281 Muscle weakness (generalized): Secondary | ICD-10-CM | POA: Diagnosis not present

## 2019-06-28 DIAGNOSIS — R2689 Other abnormalities of gait and mobility: Secondary | ICD-10-CM | POA: Diagnosis not present

## 2019-06-28 NOTE — Therapy (Signed)
[facility] [place], [place], [address] Phone: [phone]   Fax:  [phone]  Physical Therapy Treatment  Patient Details  Name: Rachel Fox MRN: [id number] Date of Birth: [birth year] Referring Provider (PT): Allyn Kenner   Encounter Date: 06/28/2019  PT End of Session - 06/28/19 1122    Visit Number  21    Number of Visits  27    Date for PT Re-Evaluation  07/22/19    Authorization Type  Primary: Medicare part A and B secondary: [place] Time Period  02/24/19-04/20/18; 04/18/19-05/20/19; 05/26/19-06/24/19; 06/21/19-07/22/19    Authorization - Visit Number  2    Authorization - Number of Visits  10    Progress Note Due on Visit  29    PT Start Time  1117    PT Stop Time  1202    PT Time Calculation (min)  45 min    Equipment Utilized During Treatment  Gait belt   hemiwalker   Activity Tolerance  Patient tolerated treatment well;Patient limited by fatigue    Behavior During Therapy  WFL for tasks assessed/performed       Past Medical History:  Diagnosis Date  . Atrial fibrillation ([place])   . CVA (cerebral vascular accident) ([place])   . High cholesterol   . Hypertension   . Type 2 diabetes mellitus ([place])     Past Surgical History:  Procedure Laterality Date  . ABDOMINAL HYSTERECTOMY    . TUBAL LIGATION      There were no vitals filed for this visit.  Subjective Assessment - 06/28/19 1122    Subjective  Patient reports no new issues since last visit. She says she feels her AFO brace has been helpful with her walking.    Patient Stated Goals  to improve balance and gait    Currently in Pain?  No/denies                       [facility] Adult PT Treatment/Exercise - 06/28/19 0001      Knee/Hip Exercises: Standing   Hip Flexion  Both;1 set;10 reps    Hip Flexion Limitations  standing march, assist for LLE lock out for RT hip flexion     Hip Abduction  Both;2 sets;10 reps;Knee straight     Abduction Limitations  AAROM for LT hip abduction, assist for LLE lock out for RT hip abduction    Forward Step Up  Both;1 set;10 reps;Hand Hold: 1;Step Height: 4"    Forward Step Up Limitations  AAROM for LT hip flexion     Gait Training  105' with hemiwalker, cues for LT ground clearance, LOB x 1      Knee/Hip Exercises: Seated   Sit to Sand  2 sets;5 reps;with UE support               PT Short Term Goals - 04/18/19 1111      PT SHORT TERM GOAL #1   Title  Patient will be independent with HEP in order to optimze functional outcomes.    Baseline  Patient verbalized and demos compliance with seated marches, LAQ, hip adduciton squeeze    Time  4    Period  Weeks    Status  Achieved    Target Date  03/24/19        PT Long Term Goals - 06/21/19 1024      PT LONG TERM GOAL #1  Title  Patient will be able to ambulate at least 50 feet with LRAD  to demonstrate improved ability to access the bathroom in her home.    Baseline  Current: 80 feet with hemi walker, min verbal cues for ground clearance on LT, LOB x 1    Time  8    Period  Weeks    Status  Partially Met      PT LONG TERM GOAL #2   Title  Patient will be able to perform sit to stand with supervision in order to demonstrate improved LE strength.    Baseline  Current: Min A, min VCs for LLE placmement and weightshifting    Time  8    Period  Weeks    Status  On-going      PT LONG TERM GOAL #3   Title  Patient will report ability to transfer to shower chair for improved ability to perform hygenic activities.    Baseline  Patient says she does not use this because it is too big, says she prefers sponge bathing    Time  8    Period  Weeks    Status  Deferred      PT LONG TERM GOAL #4   Title  Patient will be able to complete bed mobility independently for improved ease of self care.    Baseline  Able to perform all bed mobility Mod I and with min VC for hand and leg placement    Time  8    Period  Weeks     Status  Partially Met            Plan - 06/28/19 1255    Clinical Impression Statement  Patient tolerated session well today. Patient is making continued improvement with tolerance to functional activity and gait distance, but continues to require verbal cues and therapist assist for LOB when fatigued. Patient with much better ground clearance on LLE but still has trouble clearing LT leg when fatigued, able to walk about 80 feet before this became an issue today. Patient continues to be limited in function by LT sided weakness and required AAROM assist from therapist for hip marching and standing abduction.    Personal Factors and Comorbidities  Age;Behavior Pattern;Comorbidity 3+    Comorbidities  CVA, hypertension, diabetes    Examination-Activity Limitations  Bathing;Bed Mobility;Bend;Caring for Others;Carry;Dressing;Hygiene/Grooming;Lift;Locomotion Level;Sit;Squat;Stairs;Stand;Toileting;Transfers    Examination-Participation Restrictions  Cleaning;Community Activity;Laundry;Meal Prep;Shop;Volunteer;Yard Work    [employer]chant [employer] officer  Evolving/Moderate complexity    Rehab Potential  Fair    PT Frequency  2x / week    PT Duration  4 weeks    PT Treatment/Interventions  ADLs/Self Care Home Management;Aquatic Therapy;Biofeedback;Cryotherapy;Iontophoresis 38m/ml Dexamethasone;Electrical Stimulation;Moist Heat;Traction;Ultrasound;DME Instruction;Gait training;Stair training;Functional mobility training;Therapeutic activities;Therapeutic exercise;Balance training;Neuromuscular re-education;Patient/family education;Orthotic Fit/Training;Wheelchair mobility training;Manual techniques;Compression bandaging;Passive range of motion;Dry needling;Energy conservation;Splinting;Taping;Vasopneumatic Device;Spinal Manipulations;Joint Manipulations    PT Next Visit Plan  [place]gres gait training with AFO, dynamic balance in seated, static balance and progress to dynamaic balance/ steps/ obstacles as  tolerated.    PT Home Exercise Plan  12/15: seated HS stretch, seated marching with control       Patient will benefit from skilled therapeutic intervention in order to improve the following deficits and impairments:  Abnormal gait, Decreased activity tolerance, Decreased balance, Decreased coordination, Decreased endurance, Decreased mobility, Decreased range of motion, Decreased skin integrity, Decreased strength, Difficulty walking, Impaired flexibility, Impaired sensation, Impaired tone, Improper body mechanics, Postural dysfunction, Pain  Visit Diagnosis: Muscle weakness (generalized)  Other symptoms and signs involving the nervous system  Other abnormalities of gait and mobility     Problem List Patient Active Problem List   Diagnosis Date Noted  . Spastic hemiparesis ([place])   . Labile blood glucose   . Acute pain of left shoulder   . AKI (acute kidney injury) ([place])   . Controlled type 2 diabetes mellitus with hyperglycemia, without long-term current use of insulin ([place])   . Dysphagia, post-stroke   . Atrial fibrillation ([place])   . Right middle cerebral artery stroke ([place]) 09/27/2018  . Pressure injury of skin 09/21/2018  . Cerebrovascular accident (CVA) with involvement of left side of body ([place]) 09/20/2018  . Atrial fibrillation with RVR ([place]) 09/20/2018  . Hypertension   . Rhabdomyolysis   . Type 2 diabetes mellitus ([place])   . Hyperlipidemia   . Hyperbilirubinemia   . Leukocytosis   . Hypernatremia   2:31 PM, 06/28/19   Josue Hector PT DPT  Physical Therapist with [facility]  [phone] [place] [address] [place], [place], [address] Phone: [phone]   Fax:  [phone]  Name: Rachel Fox MRN: [id number] Date of Birth: 27-Jun-1945

## 2019-06-30 ENCOUNTER — Other Ambulatory Visit: Payer: Self-pay

## 2019-06-30 ENCOUNTER — Encounter (HOSPITAL_COMMUNITY): Payer: Self-pay

## 2019-06-30 ENCOUNTER — Ambulatory Visit (HOSPITAL_COMMUNITY): Payer: Medicare Other

## 2019-06-30 DIAGNOSIS — M6281 Muscle weakness (generalized): Secondary | ICD-10-CM | POA: Diagnosis not present

## 2019-06-30 DIAGNOSIS — R2689 Other abnormalities of gait and mobility: Secondary | ICD-10-CM | POA: Diagnosis not present

## 2019-06-30 DIAGNOSIS — R29818 Other symptoms and signs involving the nervous system: Secondary | ICD-10-CM | POA: Diagnosis not present

## 2019-06-30 NOTE — Therapy (Signed)
[facility] [place], [place], [address] Phone: [phone]   Fax:  [phone]  Physical Therapy Treatment  Patient Details  Name: Rachel Fox MRN: [id number] Date of Birth: 1946/02/20 Referring Provider (PT): Allyn Kenner   Encounter Date: 06/30/2019  PT End of Session - 06/30/19 1924    Visit Number  22    Number of Visits  27    Date for PT Re-Evaluation  07/22/19    Authorization Type  Primary: Medicare part A and B secondary: [place] - Visit Number  3    Authorization - Number of Visits  10    Progress Note Due on Visit  29    PT Start Time  1135    PT Stop Time  1213    PT Time Calculation (min)  38 min    Equipment Utilized During Treatment  Gait belt   hemiwalker   Activity Tolerance  Patient tolerated treatment well;Patient limited by fatigue;Patient limited by pain;No increased pain    Behavior During Therapy  WFL for tasks assessed/performed       Past Medical History:  Diagnosis Date  . Atrial fibrillation ([place])   . CVA (cerebral vascular accident) ([place])   . High cholesterol   . Hypertension   . Type 2 diabetes mellitus ([place])     Past Surgical History:  Procedure Laterality Date  . ABDOMINAL HYSTERECTOMY    . TUBAL LIGATION      There were no vitals filed for this visit.  Subjective Assessment - 06/30/19 1614    Subjective  Pt reports increased pain arounds posterior knee where AFO velcro is at and Lt hip, pain scale 5/10 today.    Pertinent History  CVA, hypertension, diabetes    Limitations  Standing;House hold activities;Walking    Patient Stated Goals  to improve balance and gait    Currently in Pain?  Yes    Pain Score  5     Pain Location  Hip   hip and posterior calf   Pain Orientation  Left    Pain Descriptors / Indicators  Aching;Sore;Pressure    Pain Type  Chronic pain           06/30/19 0001  Transfers  Transfers Sit to Stand  Sit to Stand 4: Min  assist  Sit to Stand Details (indicate cue type and reason) Required assistance pulling pants up/down.  Able to stand independently wiht assistance of rail.  Ambulation/Gait  Ambulation/Gait Yes  Ambulation/Gait Assistance 4: Min guard;4: Min assist  Ambulation Distance (Feet) 45 Feet (36' then 8')  Assistive device Hemi-walker  Gait Pattern Step-to pattern;Decreased step length - right;Decreased stance time - left;Ataxic;Left flexed knee in stance;Narrow base of support  Ambulation Surface Level;Indoor  Gait Comments LImited by Lt hip and leg pain, reduced tolerance for weight bearing  Knee/Hip Exercises: Seated  Sit to Sand 2 sets;5 reps;with UE support  Knee/Hip Exercises: Supine  Bridges 10 reps  Bridges Limitations therapist assistance wiht Lt LE  Knee/Hip Exercises: Sidelying  Clams 2x 5. therapist assist with Lt LE        PT Short Term Goals - 04/18/19 1111      PT SHORT TERM GOAL #1   Title  Patient will be independent with HEP in order to optimze functional outcomes.    Baseline  Patient verbalized and demos compliance with seated marches, LAQ, hip adduciton squeeze    Time  4  Period  Weeks    Status  Achieved    Target Date  03/24/19        PT Long Term Goals - 06/21/19 1024      PT LONG TERM GOAL #1   Title  Patient will be able to ambulate at least 50 feet with LRAD  to demonstrate improved ability to access the bathroom in her home.    Baseline  Current: 80 feet with hemi walker, min verbal cues for ground clearance on LT, LOB x 1    Time  8    Period  Weeks    Status  Partially Met      PT LONG TERM GOAL #2   Title  Patient will be able to perform sit to stand with supervision in order to demonstrate improved LE strength.    Baseline  Current: Min A, min VCs for LLE placmement and weightshifting    Time  8    Period  Weeks    Status  On-going      PT LONG TERM GOAL #3   Title  Patient will report ability to transfer to shower chair for improved  ability to perform hygenic activities.    Baseline  Patient says she does not use this because it is too big, says she prefers sponge bathing    Time  8    Period  Weeks    Status  Deferred      PT LONG TERM GOAL #4   Title  Patient will be able to complete bed mobility independently for improved ease of self care.    Baseline  Able to perform all bed mobility Mod I and with min VC for hand and leg placement    Time  8    Period  Weeks    Status  Partially Met            Plan - 07/01/19 0824    Clinical Impression Statement  Pt limited by pain Lt LE limited ability to tolerate standing this session.  Upon examination no noted skin irritation with skin contact to AFO.  Pt required min A for standing and increased unsteadiness upon standing.  Mat activities complete for hip strengthening for pain control with no reports of increased pain at EOS.    Personal Factors and Comorbidities  Age;Behavior Pattern;Comorbidity 3+    Comorbidities  CVA, hypertension, diabetes    Examination-Activity Limitations  Bathing;Bed Mobility;Bend;Caring for Others;Carry;Dressing;Hygiene/Grooming;Lift;Locomotion Level;Sit;Squat;Stairs;Stand;Toileting;Transfers    Examination-Participation Restrictions  Cleaning;Community Activity;Laundry;Meal Prep;Shop;Volunteer;Yard Work    [employer]chant [employer] officer  Evolving/Moderate complexity    Clinical Decision Making  Moderate    Rehab Potential  Fair    PT Frequency  2x / week    PT Duration  4 weeks    PT Treatment/Interventions  ADLs/Self Care Home Management;Aquatic Therapy;Biofeedback;Cryotherapy;Iontophoresis 40m/ml Dexamethasone;Electrical Stimulation;Moist Heat;Traction;Ultrasound;DME Instruction;Gait training;Stair training;Functional mobility training;Therapeutic activities;Therapeutic exercise;Balance training;Neuromuscular re-education;Patient/family education;Orthotic Fit/Training;Wheelchair mobility training;Manual techniques;Compression  bandaging;Passive range of motion;Dry needling;Energy conservation;Splinting;Taping;Vasopneumatic Device;Spinal Manipulations;Joint Manipulations    PT Next Visit Plan  [place]gres gait training with AFO, dynamic balance in seated, static balance and progress to dynamaic balance/ steps/ obstacles as tolerated.    PT Home Exercise Plan  12/15: seated HS stretch, seated marching with control       Patient will benefit from skilled therapeutic intervention in order to improve the following deficits and impairments:  Abnormal gait, Decreased activity tolerance, Decreased balance, Decreased coordination, Decreased endurance, Decreased mobility, Decreased range of motion, Decreased skin integrity, Decreased  strength, Difficulty walking, Impaired flexibility, Impaired sensation, Impaired tone, Improper body mechanics, Postural dysfunction, Pain  Visit Diagnosis: Muscle weakness (generalized)  Other symptoms and signs involving the nervous system  Other abnormalities of gait and mobility     Problem List Patient Active Problem List   Diagnosis Date Noted  . Spastic hemiparesis ([place])   . Labile blood glucose   . Acute pain of left shoulder   . AKI (acute kidney injury) ([place])   . Controlled type 2 diabetes mellitus with hyperglycemia, without long-term current use of insulin ([place])   . Dysphagia, post-stroke   . Atrial fibrillation ([place])   . Right middle cerebral artery stroke ([place]) 09/27/2018  . Pressure injury of skin 09/21/2018  . Cerebrovascular accident (CVA) with involvement of left side of body ([place]) 09/20/2018  . Atrial fibrillation with RVR ([place]) 09/20/2018  . Hypertension   . Rhabdomyolysis   . Type 2 diabetes mellitus ([place])   . Hyperlipidemia   . Hyperbilirubinemia   . Leukocytosis   . Hypernatremia     Aldona Lento 07/01/2019, 8:29 AM  [facility] [address] [place], [place], [address] Phone: [phone]   Fax:   [phone]  Name: Rachel Fox MRN: [id number] Date of Birth: March 03, 1946

## 2019-07-01 DIAGNOSIS — Z23 Encounter for immunization: Secondary | ICD-10-CM | POA: Diagnosis not present

## 2019-07-05 ENCOUNTER — Other Ambulatory Visit: Payer: Self-pay

## 2019-07-05 ENCOUNTER — Encounter (HOSPITAL_COMMUNITY): Payer: Self-pay | Admitting: Physical Therapy

## 2019-07-05 ENCOUNTER — Ambulatory Visit (HOSPITAL_COMMUNITY): Payer: Medicare Other | Admitting: Physical Therapy

## 2019-07-05 DIAGNOSIS — R29818 Other symptoms and signs involving the nervous system: Secondary | ICD-10-CM | POA: Diagnosis not present

## 2019-07-05 DIAGNOSIS — M6281 Muscle weakness (generalized): Secondary | ICD-10-CM | POA: Diagnosis not present

## 2019-07-05 DIAGNOSIS — R2689 Other abnormalities of gait and mobility: Secondary | ICD-10-CM

## 2019-07-05 NOTE — Therapy (Signed)
[facility] [place], [place], [address] Phone: [phone]   Fax:  [phone]  Physical Therapy Treatment  Patient Details  Name: Rachel Fox MRN: [id number] Date of Birth: 01/20/1946 Referring Provider (PT): Allyn Kenner   Encounter Date: 07/05/2019  PT End of Session - 07/05/19 1133    Visit Number  23    Number of Visits  27    Date for PT Re-Evaluation  07/22/19    Authorization Type  Primary: Medicare part A and B secondary: [place] - Visit Number  4    Authorization - Number of Visits  10    Progress Note Due on Visit  29    PT Start Time  1130    PT Stop Time  1205    PT Time Calculation (min)  35 min    Equipment Utilized During Treatment  Gait belt   hemiwalker   Activity Tolerance  Patient tolerated treatment well;Patient limited by fatigue    Behavior During Therapy  WFL for tasks assessed/performed       Past Medical History:  Diagnosis Date  . Atrial fibrillation ([place])   . CVA (cerebral vascular accident) ([place])   . High cholesterol   . Hypertension   . Type 2 diabetes mellitus ([place])     Past Surgical History:  Procedure Laterality Date  . ABDOMINAL HYSTERECTOMY    . TUBAL LIGATION      There were no vitals filed for this visit.  Subjective Assessment - 07/05/19 1131    Subjective  Pateint says she is doing ok today. Says she has been sore the last few visits from new exercise but feels good. Says she has been doing exercises at the center this week as well.    Pertinent History  CVA, hypertension, diabetes    Limitations  Standing;House hold activities;Walking    Patient Stated Goals  to improve balance and gait    Currently in Pain?  No/denies                       [facility] Adult PT Treatment/Exercise - 07/05/19 0001      Knee/Hip Exercises: Standing   Hip Abduction  Both;Knee straight;1 set;15 reps    Abduction Limitations  AAROM for LT hip  abduction, assist for LLE lock out for RT hip abduction    Gait Training  100' with hemiwalker, cues for LT ground clearance    Other Standing Knee Exercises  standing static balance 2' with intermittent HHA       Knee/Hip Exercises: Seated   Other Seated Knee/Hip Exercises  seated cone reach for balance and core stability 3 rounds 5 cones    Other Seated Knee/Hip Exercises  seated unsupported perturbaitons 5' in all planes    Sit to Sand  1 set;5 reps;with UE support               PT Short Term Goals - 04/18/19 1111      PT SHORT TERM GOAL #1   Title  Patient will be independent with HEP in order to optimze functional outcomes.    Baseline  Patient verbalized and demos compliance with seated marches, LAQ, hip adduciton squeeze    Time  4    Period  Weeks    Status  Achieved    Target Date  03/24/19        PT Long Term Goals - 06/21/19 1024  PT LONG TERM GOAL #1   Title  Patient will be able to ambulate at least 50 feet with LRAD  to demonstrate improved ability to access the bathroom in her home.    Baseline  Current: 80 feet with hemi walker, min verbal cues for ground clearance on LT, LOB x 1    Time  8    Period  Weeks    Status  Partially Met      PT LONG TERM GOAL #2   Title  Patient will be able to perform sit to stand with supervision in order to demonstrate improved LE strength.    Baseline  Current: Min A, min VCs for LLE placmement and weightshifting    Time  8    Period  Weeks    Status  On-going      PT LONG TERM GOAL #3   Title  Patient will report ability to transfer to shower chair for improved ability to perform hygenic activities.    Baseline  Patient says she does not use this because it is too big, says she prefers sponge bathing    Time  8    Period  Weeks    Status  Deferred      PT LONG TERM GOAL #4   Title  Patient will be able to complete bed mobility independently for improved ease of self care.    Baseline  Able to perform all  bed mobility Mod I and with min VC for hand and leg placement    Time  8    Period  Weeks    Status  Partially Met            Plan - 07/05/19 1212    Clinical Impression Statement  Patient tolerated session well today. Session limited with time as patient required non therapist assist for toileting at beginning of session. Patient asked about exercise to improve balance that she can do with assistants at the center. Added seated cone reaches outside BOS, and seated unsupported perturbations in all directions. Patient tolerated these activities well, with minimal fatigue. Patient then progressed to static standing balance. Patient did well but required intermittent HHA and verbal cues for widening BOS/ foot placement. Patient with increased fatigue during standing activity. Patient able to ambulate 100 feet using hemi walker with no rest breaks, no LOB this date, but does require min verbal cueing for LLE ground clearance when fatigued.    Personal Factors and Comorbidities  Age;Behavior Pattern;Comorbidity 3+    Comorbidities  CVA, hypertension, diabetes    Examination-Activity Limitations  Bathing;Bed Mobility;Bend;Caring for Others;Carry;Dressing;Hygiene/Grooming;Lift;Locomotion Level;Sit;Squat;Stairs;Stand;Toileting;Transfers    Examination-Participation Restrictions  Cleaning;Community Activity;Laundry;Meal Prep;Shop;Volunteer;Yard Work    [employer]chant [employer] officer  Evolving/Moderate complexity    Rehab Potential  Fair    PT Frequency  2x / week    PT Duration  4 weeks    PT Treatment/Interventions  ADLs/Self Care Home Management;Aquatic Therapy;Biofeedback;Cryotherapy;Iontophoresis '4mg'$ /ml Dexamethasone;Electrical Stimulation;Moist Heat;Traction;Ultrasound;DME Instruction;Gait training;Stair training;Functional mobility training;Therapeutic activities;Therapeutic exercise;Balance training;Neuromuscular re-education;Patient/family education;Orthotic Fit/Training;Wheelchair mobility  training;Manual techniques;Compression bandaging;Passive range of motion;Dry needling;Energy conservation;Splinting;Taping;Vasopneumatic Device;Spinal Manipulations;Joint Manipulations    PT Next Visit Plan  Progres gait training with AFO, dynamic balance in seated, static balance and progress to dynamaic balance/ steps/ obstacles as tolerated.    PT Home Exercise Plan  12/15: seated HS stretch, seated marching with control       Patient will benefit from skilled therapeutic intervention in order to improve the following deficits and impairments:  Abnormal gait, Decreased  activity tolerance, Decreased balance, Decreased coordination, Decreased endurance, Decreased mobility, Decreased range of motion, Decreased skin integrity, Decreased strength, Difficulty walking, Impaired flexibility, Impaired sensation, Impaired tone, Improper body mechanics, Postural dysfunction, Pain  Visit Diagnosis: Muscle weakness (generalized)  Other symptoms and signs involving the nervous system  Other abnormalities of gait and mobility     Problem List Patient Active Problem List   Diagnosis Date Noted  . Spastic hemiparesis ([place])   . Labile blood glucose   . Acute pain of left shoulder   . AKI (acute kidney injury) ([place])   . Controlled type 2 diabetes mellitus with hyperglycemia, without long-term current use of insulin ([place])   . Dysphagia, post-stroke   . Atrial fibrillation ([place])   . 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 Pressure injury of skin 09/21/2018  . Cerebrovascular accident (CVA) with involvement of left side of body ([place]) 09/20/2018  . Atrial fibrillation with RVR ([place]) 09/20/2018  . Hypertension   . Rhabdomyolysis   . Type 2 diabetes mellitus ([place])   . Hyperlipidemia   . Hyperbilirubinemia   . Leukocytosis   . Hypernatremia    12:19 PM, 07/05/19 Josue Hector PT DPT  Physical Therapist with [facility]  [phone] [facility] [address] [place], [place], [address] Phone: [phone]   Fax:  [phone]  Name: Rachel Fox MRN: [id number] Date of Birth: September 05, 1945

## 2019-07-07 ENCOUNTER — Encounter (HOSPITAL_COMMUNITY): Payer: Self-pay

## 2019-07-07 ENCOUNTER — Ambulatory Visit (HOSPITAL_COMMUNITY): Payer: Medicare Other

## 2019-07-07 ENCOUNTER — Other Ambulatory Visit: Payer: Self-pay

## 2019-07-07 DIAGNOSIS — R2689 Other abnormalities of gait and mobility: Secondary | ICD-10-CM

## 2019-07-07 DIAGNOSIS — R29818 Other symptoms and signs involving the nervous system: Secondary | ICD-10-CM | POA: Diagnosis not present

## 2019-07-07 DIAGNOSIS — M6281 Muscle weakness (generalized): Secondary | ICD-10-CM

## 2019-07-07 NOTE — Therapy (Signed)
[place], [place], [address] Phone: [phone]   Fax:  [phone]  Physical Therapy Treatment  Patient Details  Name: Rachel Fox MRN: [id number] Date of Birth: 10/25/1945 Referring Provider (PT): Allyn Kenner   Encounter Date: 07/07/2019  PT End of Session - 07/07/19 1310    Visit Number  24    Number of Visits  27    Date for PT Re-Evaluation  07/22/19    Authorization Type  Primary: Medicare part A and B secondary: [place] - Visit Number  5    Authorization - Number of Visits  10    Progress Note Due on Visit  29    PT Start Time  1136    PT Stop Time  1220    PT Time Calculation (min)  44 min    Equipment Utilized During Treatment  Gait belt   hemiwalker, WC behind during gait   Activity Tolerance  Patient tolerated treatment well;Patient limited by fatigue    Behavior During Therapy  [facility] for tasks assessed/performed       Past Medical History:  Diagnosis Date  . Atrial fibrillation ([place])   . CVA (cerebral vascular accident) ([place])   . High cholesterol   . Hypertension   . Type 2 diabetes mellitus ([place])     Past Surgical History:  Procedure Laterality Date  . ABDOMINAL HYSTERECTOMY    . TUBAL LIGATION      There were no vitals filed for this visit.  Subjective Assessment - 07/07/19 1306    Subjective  Pt stated she is tired today especially Lt LE, stated she has [facility] prior apt today.  No reports of pain today.    Pertinent History  CVA, hypertension, diabetes    Patient Stated Goals  to improve balance and gait    Currently in Pain?  No/denies                       [facility] Adult PT Treatment/Exercise - 07/07/19 0001      Transfers   Transfers  Sit to Stand    Sit to Stand  4: Min assist    Sit to Stand Details (indicate cue type and reason)  CGA during restroom break, need for arm rail and min guard when standing no  HHA.  Able to pull up pants by herself with min aide,      Ambulation/Gait   Ambulation/Gait  Yes    Ambulation/Gait Assistance  4: Min guard;4: Min assist    Ambulation Distance (Feet)  226 Feet    Assistive device  Hemi-walker    Gait Pattern  Step-to pattern;Decreased step length - right;Decreased stance time - left;Ataxic;Left flexed knee in stance;Narrow base of support    Ambulation Surface  Level;Indoor      Knee/Hip Exercises: Standing   Hip Abduction  2 sets;10 reps    Abduction Limitations  AAROM for LT hip abduction, assist for LLE lock out for RT hip abduction    Gait Training  226' with hemiwalker    Other Standing Knee Exercises  static standing balance with RT UE flexion    Other Standing Knee Exercises  NBOS cone rotation with Rt UE both directions      Knee/Hip Exercises: Seated   Sit to Sand  1 set;5 reps;with UE support  PT Short Term Goals - 04/18/19 1111      PT SHORT TERM GOAL #1   Title  Patient will be independent with HEP in order to optimze functional outcomes.    Baseline  Patient verbalized and demos compliance with seated marches, LAQ, hip adduciton squeeze    Time  4    Period  Weeks    Status  Achieved    Target Date  03/24/19        PT Long Term Goals - 06/21/19 1024      PT LONG TERM GOAL #1   Title  Patient will be able to ambulate at least 50 feet with LRAD  to demonstrate improved ability to access the bathroom in her home.    Baseline  Current: 80 feet with hemi walker, min verbal cues for ground clearance on LT, LOB x 1    Time  8    Period  Weeks    Status  Partially Met      PT LONG TERM GOAL #2   Title  Patient will be able to perform sit to stand with supervision in order to demonstrate improved LE strength.    Baseline  Current: Min A, min VCs for LLE placmement and weightshifting    Time  8    Period  Weeks    Status  On-going      PT LONG TERM GOAL #3   Title  Patient will report ability to transfer  to shower chair for improved ability to perform hygenic activities.    Baseline  Patient says she does not use this because it is too big, says she prefers sponge bathing    Time  8    Period  Weeks    Status  Deferred      PT LONG TERM GOAL #4   Title  Patient will be able to complete bed mobility independently for improved ease of self care.    Baseline  Able to perform all bed mobility Mod I and with min VC for hand and leg placement    Time  8    Period  Weeks    Status  Partially Met            Plan - 07/07/19 1311    Clinical Impression Statement  Pt tolerated well towards sessoin.  Pt improving functional independence though does require min guard/A during standing without HHA for safety.  With minimal assistance pt able to pull up her own pants during restroom break wiht min guard for balance.  Able to ambulate 258f prior need for rest break today, no LOB though increased difficulty clearing Lt LE when fatigued.  Added static balance activities with Rt UE movements to assist with functional abilities.  No reoprts of pain, was limited by fatigue EOS.    Personal Factors and Comorbidities  Age;Behavior Pattern;Comorbidity 3+    Comorbidities  CVA, hypertension, diabetes    Examination-Activity Limitations  Bathing;Bed Mobility;Bend;Caring for Others;Carry;Dressing;Hygiene/Grooming;Lift;Locomotion Level;Sit;Squat;Stairs;Stand;Toileting;Transfers    Examination-Participation Restrictions  Cleaning;Community Activity;Laundry;Meal Prep;Shop;Volunteer;Yard Work    SMerchant [employer] officer Evolving/Moderate complexity    Clinical Decision Making  Moderate    Rehab Potential  Fair    PT Frequency  2x / week    PT Duration  4 weeks    PT Treatment/Interventions  ADLs/Self Care Home Management;Aquatic Therapy;Biofeedback;Cryotherapy;Iontophoresis 448mml Dexamethasone;Electrical Stimulation;Moist Heat;Traction;Ultrasound;DME Instruction;Gait training;Stair training;Functional  mobility training;Therapeutic activities;Therapeutic exercise;Balance training;Neuromuscular re-education;Patient/family education;Orthotic Fit/Training;Wheelchair mobility training;Manual techniques;Compression bandaging;Passive range of motion;Dry needling;Energy conservation;Splinting;Taping;Vasopneumatic Device;Spinal  Manipulations;Joint Manipulations    PT Next Visit Plan  [place] gait training with AFO, dynamic balance in seated, static balance and progress to dynamaic balance/ steps/ obstacles as tolerated.    PT Home Exercise Plan  12/15: seated HS stretch, seated marching with control       Patient will benefit from skilled therapeutic intervention in order to improve the following deficits and impairments:  Abnormal gait, Decreased activity tolerance, Decreased balance, Decreased coordination, Decreased endurance, Decreased mobility, Decreased range of motion, Decreased skin integrity, Decreased strength, Difficulty walking, Impaired flexibility, Impaired sensation, Impaired tone, Improper body mechanics, Postural dysfunction, Pain  Visit Diagnosis: Other symptoms and signs involving the nervous system  Other abnormalities of gait and mobility  Muscle weakness (generalized)     Problem List Patient Active Problem List   Diagnosis Date Noted  . Spastic hemiparesis ([place])   . Labile blood glucose   . Acute pain of left shoulder   . AKI (acute kidney injury) ([place])   . Controlled type 2 diabetes mellitus with hyperglycemia, without long-term current use of insulin ([place])   . Dysphagia, post-stroke   . Atrial fibrillation ([place])   . Right middle cerebral artery stroke ([place]) 09/27/2018  . Pressure injury of skin 09/21/2018  . Cerebrovascular accident (CVA) with involvement of left side of body ([place]) 09/20/2018  . Atrial fibrillation with RVR ([place]) 09/20/2018  . Hypertension   . Rhabdomyolysis   . Type 2 diabetes mellitus ([place])   . Hyperlipidemia   . Hyperbilirubinemia   .  Leukocytosis   . Hypernatremia   Ihor Austin, LPTA/CLT; CBIS [phone]   Aldona Lento 07/07/2019, 1:19 PM  [place] [address] [place], [place], [address] Phone: [phone]   Fax:  [phone]  Name: Rachel Fox MRN: [id number] Date of Birth: [birth year]

## 2019-07-12 ENCOUNTER — Other Ambulatory Visit: Payer: Self-pay

## 2019-07-12 ENCOUNTER — Encounter (HOSPITAL_COMMUNITY): Payer: Self-pay | Admitting: Physical Therapy

## 2019-07-12 ENCOUNTER — Ambulatory Visit (HOSPITAL_COMMUNITY): Payer: Medicare Other | Admitting: Physical Therapy

## 2019-07-12 DIAGNOSIS — R2689 Other abnormalities of gait and mobility: Secondary | ICD-10-CM | POA: Diagnosis not present

## 2019-07-12 DIAGNOSIS — M6281 Muscle weakness (generalized): Secondary | ICD-10-CM

## 2019-07-12 DIAGNOSIS — R29818 Other symptoms and signs involving the nervous system: Secondary | ICD-10-CM | POA: Diagnosis not present

## 2019-07-12 NOTE — Therapy (Signed)
[place], [place], [address] Phone: [phone]   Fax:  [phone]  Physical Therapy Treatment  Patient Details  Name: Rachel Fox MRN: [id number] Date of Birth: [birth year] Referring Provider (PT): Allyn Kenner   Encounter Date: 07/12/2019  PT End of Session - 07/12/19 1119    Visit Number  25    Number of Visits  27    Date for PT Re-Evaluation  07/22/19    Authorization Type  Primary: Medicare part A and B secondary: [place] - Visit Number  6    Authorization - Number of Visits  10    Progress Note Due on Visit  29    PT Start Time  1120    PT Stop Time  1200    PT Time Calculation (min)  40 min    Equipment Utilized During Treatment  Gait belt   hemiwalker, WC behind during gait   Activity Tolerance  Patient limited by fatigue    Behavior During Therapy  [facility] for tasks assessed/performed       Past Medical History:  Diagnosis Date  . Atrial fibrillation ([place])   . CVA (cerebral vascular accident) ([place])   . High cholesterol   . Hypertension   . Type 2 diabetes mellitus ([place])     Past Surgical History:  Procedure Laterality Date  . ABDOMINAL HYSTERECTOMY    . TUBAL LIGATION      There were no vitals filed for this visit.  Subjective Assessment - 07/12/19 1123    Subjective  Patient says she feels good today. No new issues.    Pertinent History  CVA, hypertension, diabetes    Patient Stated Goals  to improve balance and gait    Currently in Pain?  No/denies                       [facility] Adult PT Treatment/Exercise - 07/12/19 0001      Transfers   Transfers  Stand Pivot Transfers    Stand to Sit  4: Min assist;3: Mod assist    Stand to Sit Details (indicate cue type and reason)  --   cues for weight shift, foot placment, push off      Knee/Hip Exercises: Standing   Other Standing Knee Exercises  sidestepping at mat 4RT; 4 inch hurlde step over x 2 with  hemi walker    Other Standing Knee Exercises  NBOS cone rotation with Rt UE 5 x both directions      Knee/Hip Exercises: Seated   Other Seated Knee/Hip Exercises  seated unsupported perturbaitons 4' in all planes    Sit to Sand  1 set;5 reps;with UE support               PT Short Term Goals - 04/18/19 1111      PT SHORT TERM GOAL #1   Title  Patient will be independent with HEP in order to optimze functional outcomes.    Baseline  Patient verbalized and demos compliance with seated marches, LAQ, hip adduciton squeeze    Time  4    Period  Weeks    Status  Achieved    Target Date  03/24/19        PT Long Term Goals - 06/21/19 1024      PT LONG TERM GOAL #1   Title  Patient will be able to ambulate at least 50 feet  with LRAD  to demonstrate improved ability to access the bathroom in her home.    Baseline  Current: 80 feet with hemi walker, min verbal cues for ground clearance on LT, LOB x 1    Time  8    Period  Weeks    Status  Partially Met      PT LONG TERM GOAL #2   Title  Patient will be able to perform sit to stand with supervision in order to demonstrate improved LE strength.    Baseline  Current: Min A, min VCs for LLE placmement and weightshifting    Time  8    Period  Weeks    Status  On-going      PT LONG TERM GOAL #3   Title  Patient will report ability to transfer to shower chair for improved ability to perform hygenic activities.    Baseline  Patient says she does not use this because it is too big, says she prefers sponge bathing    Time  8    Period  Weeks    Status  Deferred      PT LONG TERM GOAL #4   Title  Patient will be able to complete bed mobility independently for improved ease of self care.    Baseline  Able to perform all bed mobility Mod I and with min VC for hand and leg placement    Time  8    Period  Weeks    Status  Partially Met            Plan - 07/12/19 1224    Clinical Impression Statement  Patient appears more  lethargic today. Patient required increased assist with transfers, requires continued verbal cueing for hand and foot placement, weight shifting for sit to stand. Patient had increased difficulty with balance activity today, and was very challenged with cone rotations. Patient required frequent verbal cues for weight sifting and requires therapist assist for LOB with attempts at reaching LT lateral. Performance somewhat improved with forward cone reaching. Graded activity per patient tolerance. Performed seated balance with perturbations, and sidestepping at mat for lateral hip strengthening. Attempted 4 inch hurdle step over EOS, patient very challenged with this, requiring max verbal cues for navigating turn, walker placement, and was ultimately unable to lift LLE to appropriate height for ground clearance. Patient encouraged to rest, continue HEP exercise seated marching and LAQs for improved LE strengthening.    Personal Factors and Comorbidities  Age;Behavior Pattern;Comorbidity 3+    Comorbidities  CVA, hypertension, diabetes    Examination-Activity Limitations  Bathing;Bed Mobility;Bend;Caring for Others;Carry;Dressing;Hygiene/Grooming;Lift;Locomotion Level;Sit;Squat;Stairs;Stand;Toileting;Transfers    Examination-Participation Restrictions  Cleaning;Community Activity;Laundry;Meal Prep;Shop;Volunteer;Yard Work    Merchant navy officer  Evolving/Moderate complexity    Rehab Potential  Fair    PT Frequency  2x / week    PT Duration  4 weeks    PT Treatment/Interventions  ADLs/Self Care Home Management;Aquatic Therapy;Biofeedback;Cryotherapy;Iontophoresis '4mg'$ /ml Dexamethasone;Electrical Stimulation;Moist Heat;Traction;Ultrasound;DME Instruction;Gait training;Stair training;Functional mobility training;Therapeutic activities;Therapeutic exercise;Balance training;Neuromuscular re-education;Patient/family education;Orthotic Fit/Training;Wheelchair mobility training;Manual  techniques;Compression bandaging;Passive range of motion;Dry needling;Energy conservation;Splinting;Taping;Vasopneumatic Device;Spinal Manipulations;Joint Manipulations    PT Next Visit Plan  [place] gait training with AFO, dynamic balance in seated, static balance and progress to dynamaic balance/ steps/ obstacles as tolerated.    PT Home Exercise Plan  12/15: seated HS stretch, seated marching with control       Patient will benefit from skilled therapeutic intervention in order to improve the following deficits and impairments:  Abnormal gait, Decreased  activity tolerance, Decreased balance, Decreased coordination, Decreased endurance, Decreased mobility, Decreased range of motion, Decreased skin integrity, Decreased strength, Difficulty walking, Impaired flexibility, Impaired sensation, Impaired tone, Improper body mechanics, Postural dysfunction, Pain  Visit Diagnosis: Other symptoms and signs involving the nervous system  Other abnormalities of gait and mobility  Muscle weakness (generalized)     Problem List Patient Active Problem List   Diagnosis Date Noted  . Spastic hemiparesis ([place])   . Labile blood glucose   . Acute pain of left shoulder   . AKI (acute kidney injury) ([place])   . Controlled type 2 diabetes mellitus with hyperglycemia, without long-term current use of insulin ([place])   . Dysphagia, post-stroke   . Atrial fibrillation ([place])   . Right middle cerebral artery stroke ([place]) 09/27/2018  . Pressure injury of skin 09/21/2018  . Cerebrovascular accident (CVA) with involvement of left side of body ([place]) 09/20/2018  . Atrial fibrillation with RVR ([place]) 09/20/2018  . Hypertension   . Rhabdomyolysis   . Type 2 diabetes mellitus ([place])   . Hyperlipidemia   . Hyperbilirubinemia   . Leukocytosis   . Hypernatremia     12:25 PM, 07/12/19 Josue Hector PT DPT  Physical Therapist with [facility]  [phone] [place] [address] [place], [place], [address] Phone: [phone]   Fax:  [phone]  Name: Rachel Fox MRN: [id number] Date of Birth: [birth year]

## 2019-07-13 DIAGNOSIS — D72829 Elevated white blood cell count, unspecified: Secondary | ICD-10-CM | POA: Diagnosis not present

## 2019-07-13 DIAGNOSIS — D72819 Decreased white blood cell count, unspecified: Secondary | ICD-10-CM | POA: Diagnosis not present

## 2019-07-13 DIAGNOSIS — E43 Unspecified severe protein-calorie malnutrition: Secondary | ICD-10-CM | POA: Diagnosis not present

## 2019-07-13 DIAGNOSIS — E1165 Type 2 diabetes mellitus with hyperglycemia: Secondary | ICD-10-CM | POA: Diagnosis not present

## 2019-07-13 DIAGNOSIS — E119 Type 2 diabetes mellitus without complications: Secondary | ICD-10-CM | POA: Diagnosis not present

## 2019-07-13 DIAGNOSIS — E782 Mixed hyperlipidemia: Secondary | ICD-10-CM | POA: Diagnosis not present

## 2019-07-13 DIAGNOSIS — E7849 Other hyperlipidemia: Secondary | ICD-10-CM | POA: Diagnosis not present

## 2019-07-13 DIAGNOSIS — E876 Hypokalemia: Secondary | ICD-10-CM | POA: Diagnosis not present

## 2019-07-13 DIAGNOSIS — B360 Pityriasis versicolor: Secondary | ICD-10-CM | POA: Diagnosis not present

## 2019-07-13 DIAGNOSIS — F419 Anxiety disorder, unspecified: Secondary | ICD-10-CM | POA: Diagnosis not present

## 2019-07-13 DIAGNOSIS — E785 Hyperlipidemia, unspecified: Secondary | ICD-10-CM | POA: Diagnosis not present

## 2019-07-14 ENCOUNTER — Other Ambulatory Visit: Payer: Self-pay

## 2019-07-14 ENCOUNTER — Ambulatory Visit (HOSPITAL_COMMUNITY): Payer: Medicare Other

## 2019-07-14 ENCOUNTER — Encounter (HOSPITAL_COMMUNITY): Payer: Self-pay

## 2019-07-14 DIAGNOSIS — R29818 Other symptoms and signs involving the nervous system: Secondary | ICD-10-CM | POA: Diagnosis not present

## 2019-07-14 DIAGNOSIS — R2689 Other abnormalities of gait and mobility: Secondary | ICD-10-CM

## 2019-07-14 DIAGNOSIS — R1319 Other dysphagia: Secondary | ICD-10-CM | POA: Diagnosis not present

## 2019-07-14 DIAGNOSIS — K219 Gastro-esophageal reflux disease without esophagitis: Secondary | ICD-10-CM | POA: Diagnosis not present

## 2019-07-14 DIAGNOSIS — I10 Essential (primary) hypertension: Secondary | ICD-10-CM | POA: Diagnosis not present

## 2019-07-14 DIAGNOSIS — E43 Unspecified severe protein-calorie malnutrition: Secondary | ICD-10-CM | POA: Diagnosis not present

## 2019-07-14 DIAGNOSIS — R4182 Altered mental status, unspecified: Secondary | ICD-10-CM | POA: Diagnosis not present

## 2019-07-14 DIAGNOSIS — E785 Hyperlipidemia, unspecified: Secondary | ICD-10-CM | POA: Diagnosis not present

## 2019-07-14 DIAGNOSIS — E1165 Type 2 diabetes mellitus with hyperglycemia: Secondary | ICD-10-CM | POA: Diagnosis not present

## 2019-07-14 DIAGNOSIS — I69354 Hemiplegia and hemiparesis following cerebral infarction affecting left non-dominant side: Secondary | ICD-10-CM | POA: Diagnosis not present

## 2019-07-14 DIAGNOSIS — R944 Abnormal results of kidney function studies: Secondary | ICD-10-CM | POA: Diagnosis not present

## 2019-07-14 DIAGNOSIS — E876 Hypokalemia: Secondary | ICD-10-CM | POA: Diagnosis not present

## 2019-07-14 DIAGNOSIS — M6281 Muscle weakness (generalized): Secondary | ICD-10-CM

## 2019-07-14 NOTE — Therapy (Signed)
[place], [place], [address] Phone: [phone]   Fax:  [phone]  Physical Therapy Treatment  Patient Details  Name: Rachel Fox MRN: [id number] Date of Birth: February 06, 1946 Referring Provider (PT): Allyn Kenner   Encounter Date: 07/14/2019  PT End of Session - 07/14/19 1304    Visit Number  26    Number of Visits  27    Date for PT Re-Evaluation  07/22/19    Authorization Type  Primary: Medicare part A and B secondary: [name] - Visit Number  7    Authorization - Number of Visits  10    Progress Note Due on Visit  29    PT Start Time  1137    PT Stop Time  1220    PT Time Calculation (min)  43 min    Equipment Utilized During Treatment  Gait belt   Hemiwalker, WC behind   Activity Tolerance  Patient limited by fatigue    Behavior During Therapy  [facility] for tasks assessed/performed       Past Medical History:  Diagnosis Date  . Atrial fibrillation ([place])   . CVA (cerebral vascular accident) ([place])   . High cholesterol   . Hypertension   . Type 2 diabetes mellitus ([place])     Past Surgical History:  Procedure Laterality Date  . ABDOMINAL HYSTERECTOMY    . TUBAL LIGATION      There were no vitals filed for this visit.  Subjective Assessment - 07/14/19 1201    Subjective  Pt stated she feels good today.  Reports unable to wear AFO earlier this week due to swelling and discomfort.                       [facility] Adult PT Treatment/Exercise - 07/14/19 0001      Ambulation/Gait   Ambulation/Gait  Yes    Ambulation/Gait Assistance  4: Min guard    Ambulation Distance (Feet)  240 Feet    Assistive device  Hemi-walker    Gait Pattern  Step-to pattern;Decreased step length - right;Decreased stance time - left;Ataxic;Left flexed knee in stance;Narrow base of support    Ambulation Surface  Level;Indoor      Knee/Hip Exercises: Standing   Functional Squat  10 reps    Functional Squat Limitations  with Rt arm reaching towards pants to simulate restroom breaks    SLS  cone tapping 5x each    Other Standing Knee Exercises  sidestepping at mat 4RT; 4 inch hurlde step over x 2 with hemi walker    Other Standing Knee Exercises  NBOS cone rotation with Rt UE 5 x both directions      Knee/Hip Exercises: Seated   Other Seated Knee/Hip Exercises  scooting forward and back 5x    Sit to Sand  1 set;5 reps;with UE support               PT Short Term Goals - 04/18/19 1111      PT SHORT TERM GOAL #1   Title  Patient will be independent with HEP in order to optimze functional outcomes.    Baseline  Patient verbalized and demos compliance with seated marches, LAQ, hip adduciton squeeze    Time  4    Period  Weeks    Status  Achieved    Target Date  03/24/19        PT Long Term  Goals - 06/21/19 1024      PT LONG TERM GOAL #1   Title  Patient will be able to ambulate at least 50 feet with LRAD  to demonstrate improved ability to access the bathroom in her home.    Baseline  Current: 80 feet with hemi walker, min verbal cues for ground clearance on LT, LOB x 1    Time  8    Period  Weeks    Status  Partially Met      PT LONG TERM GOAL #2   Title  Patient will be able to perform sit to stand with supervision in order to demonstrate improved LE strength.    Baseline  Current: Min A, min VCs for LLE placmement and weightshifting    Time  8    Period  Weeks    Status  On-going      PT LONG TERM GOAL #3   Title  Patient will report ability to transfer to shower chair for improved ability to perform hygenic activities.    Baseline  Patient says she does not use this because it is too big, says she prefers sponge bathing    Time  8    Period  Weeks    Status  Deferred      PT LONG TERM GOAL #4   Title  Patient will be able to complete bed mobility independently for improved ease of self care.    Baseline  Able to perform all bed mobility Mod I and  with min VC for hand and leg placement    Time  8    Period  Weeks    Status  Partially Met            Plan - 07/14/19 1304    Clinical Impression Statement  Pt reports some AFO discomfort with LE swelling, pt educated on benefits with edema control with compression garments and encouraged to wear hers daily.  Also discussed benefits with LE elevation to assist wiht edema control.  Pt demonstrated improved activitiy tolerance wiht ability to ambulate 240 ft with no rest breaks and no LOB during gait.  Began squats with pants pull up this session to assist with puling up pants in restroom break with good balance noted during activity.  Pt does continues to have increased difficulty with Lt foot clearance and fatigue as well as scooting forward/backwards with Lt LE due to weakness.  No reports of pain through session, was limited by fatigue.    Personal Factors and Comorbidities  Age;Behavior Pattern;Comorbidity 3+    Comorbidities  CVA, hypertension, diabetes    Examination-Activity Limitations  Bathing;Bed Mobility;Bend;Caring for Others;Carry;Dressing;Hygiene/Grooming;Lift;Locomotion Level;Sit;Squat;Stairs;Stand;Toileting;Transfers    Examination-Participation Restrictions  Cleaning;Community Activity;Laundry;Meal Prep;Shop;Volunteer;Yard Work    Merchant [employer] officer  Evolving/Moderate complexity    Clinical Decision Making  Moderate    Rehab Potential  Fair    PT Frequency  2x / week    PT Duration  4 weeks    PT Treatment/Interventions  ADLs/Self Care Home Management;Aquatic Therapy;Biofeedback;Cryotherapy;Iontophoresis '4mg'$ /ml Dexamethasone;Electrical Stimulation;Moist Heat;Traction;Ultrasound;DME Instruction;Gait training;Stair training;Functional mobility training;Therapeutic activities;Therapeutic exercise;Balance training;Neuromuscular re-education;Patient/family education;Orthotic Fit/Training;Wheelchair mobility training;Manual techniques;Compression bandaging;Passive  range of motion;Dry needling;Energy conservation;Splinting;Taping;Vasopneumatic Device;Spinal Manipulations;Joint Manipulations    PT Next Visit Plan  Reassess next session.  Review benefits with compression garments for edema control, encourage to wear daily.    PT Home Exercise Plan  12/15: seated HS stretch, seated marching with control       Patient will benefit from skilled therapeutic  intervention in order to improve the following deficits and impairments:  Abnormal gait, Decreased activity tolerance, Decreased balance, Decreased coordination, Decreased endurance, Decreased mobility, Decreased range of motion, Decreased skin integrity, Decreased strength, Difficulty walking, Impaired flexibility, Impaired sensation, Impaired tone, Improper body mechanics, Postural dysfunction, Pain  Visit Diagnosis: Other symptoms and signs involving the nervous system  Other abnormalities of gait and mobility  Muscle weakness (generalized)     Problem List Patient Active Problem List   Diagnosis Date Noted  . Spastic hemiparesis ([place])   . Labile blood glucose   . Acute pain of left shoulder   . AKI (acute kidney injury) ([place])   . Controlled type 2 diabetes mellitus with hyperglycemia, without long-term current use of insulin ([place])   . Dysphagia, post-stroke   . Atrial fibrillation ([place])   . Right middle cerebral artery stroke ([place]) 09/27/2018  . Pressure injury of skin 09/21/2018  . Cerebrovascular accident (CVA) with involvement of left side of body ([place]) 09/20/2018  . Atrial fibrillation with RVR ([place]) 09/20/2018  . Hypertension   . Rhabdomyolysis   . Type 2 diabetes mellitus ([place])   . Hyperlipidemia   . Hyperbilirubinemia   . Leukocytosis   . Hypernatremia    Ihor Austin, LPTA/CLT; CBIS [phone]  Aldona Lento 07/14/2019, 1:11 PM  [place] [address] [place], [place], [address] Phone: [phone]   Fax:   [phone]  Name: Rachel Fox MRN: [id number] Date of Birth: June 06, 1945

## 2019-07-16 DIAGNOSIS — R944 Abnormal results of kidney function studies: Secondary | ICD-10-CM | POA: Diagnosis not present

## 2019-07-16 DIAGNOSIS — E1165 Type 2 diabetes mellitus with hyperglycemia: Secondary | ICD-10-CM | POA: Diagnosis not present

## 2019-07-16 DIAGNOSIS — R4182 Altered mental status, unspecified: Secondary | ICD-10-CM | POA: Diagnosis not present

## 2019-07-16 DIAGNOSIS — I10 Essential (primary) hypertension: Secondary | ICD-10-CM | POA: Diagnosis not present

## 2019-07-16 DIAGNOSIS — R1319 Other dysphagia: Secondary | ICD-10-CM | POA: Diagnosis not present

## 2019-07-16 DIAGNOSIS — K219 Gastro-esophageal reflux disease without esophagitis: Secondary | ICD-10-CM | POA: Diagnosis not present

## 2019-07-16 DIAGNOSIS — I69354 Hemiplegia and hemiparesis following cerebral infarction affecting left non-dominant side: Secondary | ICD-10-CM | POA: Diagnosis not present

## 2019-07-16 DIAGNOSIS — E876 Hypokalemia: Secondary | ICD-10-CM | POA: Diagnosis not present

## 2019-07-16 DIAGNOSIS — E43 Unspecified severe protein-calorie malnutrition: Secondary | ICD-10-CM | POA: Diagnosis not present

## 2019-07-16 DIAGNOSIS — E785 Hyperlipidemia, unspecified: Secondary | ICD-10-CM | POA: Diagnosis not present

## 2019-07-18 DIAGNOSIS — E785 Hyperlipidemia, unspecified: Secondary | ICD-10-CM | POA: Diagnosis not present

## 2019-07-18 DIAGNOSIS — R110 Nausea: Secondary | ICD-10-CM | POA: Diagnosis not present

## 2019-07-18 DIAGNOSIS — F419 Anxiety disorder, unspecified: Secondary | ICD-10-CM | POA: Diagnosis not present

## 2019-07-18 DIAGNOSIS — E876 Hypokalemia: Secondary | ICD-10-CM | POA: Diagnosis not present

## 2019-07-18 DIAGNOSIS — I480 Paroxysmal atrial fibrillation: Secondary | ICD-10-CM | POA: Diagnosis not present

## 2019-07-18 DIAGNOSIS — K5909 Other constipation: Secondary | ICD-10-CM | POA: Diagnosis not present

## 2019-07-18 DIAGNOSIS — K219 Gastro-esophageal reflux disease without esophagitis: Secondary | ICD-10-CM | POA: Diagnosis not present

## 2019-07-18 DIAGNOSIS — E7849 Other hyperlipidemia: Secondary | ICD-10-CM | POA: Diagnosis not present

## 2019-07-18 DIAGNOSIS — E1165 Type 2 diabetes mellitus with hyperglycemia: Secondary | ICD-10-CM | POA: Diagnosis not present

## 2019-07-18 DIAGNOSIS — E119 Type 2 diabetes mellitus without complications: Secondary | ICD-10-CM | POA: Diagnosis not present

## 2019-07-18 DIAGNOSIS — D72829 Elevated white blood cell count, unspecified: Secondary | ICD-10-CM | POA: Diagnosis not present

## 2019-07-18 DIAGNOSIS — M24522 Contracture, left elbow: Secondary | ICD-10-CM | POA: Diagnosis not present

## 2019-07-18 DIAGNOSIS — E782 Mixed hyperlipidemia: Secondary | ICD-10-CM | POA: Diagnosis not present

## 2019-07-18 DIAGNOSIS — I69354 Hemiplegia and hemiparesis following cerebral infarction affecting left non-dominant side: Secondary | ICD-10-CM | POA: Diagnosis not present

## 2019-07-18 DIAGNOSIS — B360 Pityriasis versicolor: Secondary | ICD-10-CM | POA: Diagnosis not present

## 2019-07-18 DIAGNOSIS — D72819 Decreased white blood cell count, unspecified: Secondary | ICD-10-CM | POA: Diagnosis not present

## 2019-07-18 DIAGNOSIS — N3281 Overactive bladder: Secondary | ICD-10-CM | POA: Diagnosis not present

## 2019-07-18 DIAGNOSIS — E43 Unspecified severe protein-calorie malnutrition: Secondary | ICD-10-CM | POA: Diagnosis not present

## 2019-07-20 ENCOUNTER — Ambulatory Visit (HOSPITAL_COMMUNITY): Payer: Medicare Other | Attending: Internal Medicine | Admitting: Physical Therapy

## 2019-07-20 ENCOUNTER — Other Ambulatory Visit: Payer: Self-pay

## 2019-07-20 ENCOUNTER — Encounter (HOSPITAL_COMMUNITY): Payer: Self-pay | Admitting: Physical Therapy

## 2019-07-20 DIAGNOSIS — R29818 Other symptoms and signs involving the nervous system: Secondary | ICD-10-CM | POA: Diagnosis not present

## 2019-07-20 DIAGNOSIS — M6281 Muscle weakness (generalized): Secondary | ICD-10-CM | POA: Diagnosis not present

## 2019-07-20 DIAGNOSIS — R2689 Other abnormalities of gait and mobility: Secondary | ICD-10-CM

## 2019-07-20 NOTE — Therapy (Signed)
[facility] [address] [place], [place], [address] Phone: [phone]   Fax:  [phone]  Physical Therapy Treatment/ Discharge Summary  Patient Details  Name: Rachel Fox MRN: [id number] Date of Birth: 1945-07-03 Referring Provider (PT): Allyn Kenner   Encounter Date: 07/20/2019  PHYSICAL THERAPY DISCHARGE SUMMARY  Visits from Start of Care: 27  Current functional level related to goals / functional outcomes: See below   Remaining deficits: See below   Education / Equipment: See assessment Plan: Patient agrees to discharge.  Patient goals were partially met. Patient is being discharged due to being pleased with the current functional level.  ?????       PT End of Session - 07/20/19 1125    Visit Number  27    Number of Visits  27    Date for PT Re-Evaluation  07/22/19    Authorization Type  Primary: Medicare part A and B secondary: [place] - Visit Number  8    Authorization - Number of Visits  10    Progress Note Due on Visit  29    PT Start Time  1122    PT Stop Time  1202    PT Time Calculation (min)  40 min    Equipment Utilized During Treatment  Gait belt   Hemiwalker, WC behind   Activity Tolerance  Patient tolerated treatment well    Behavior During Therapy  WFL for tasks assessed/performed       Past Medical History:  Diagnosis Date  . Atrial fibrillation ([place])   . CVA (cerebral vascular accident) ([place])   . High cholesterol   . Hypertension   . Type 2 diabetes mellitus ([place])     Past Surgical History:  Procedure Laterality Date  . ABDOMINAL HYSTERECTOMY    . TUBAL LIGATION      There were no vitals filed for this visit.  Subjective Assessment - 07/20/19 1124    Subjective  Patietn reports without complaint today "I feel pretty good".    Patient Stated Goals  to improve balance and gait    Currently in Pain?  No/denies         [facility] PT Assessment - 07/20/19 0001       Assessment   Medical Diagnosis  LUE spastic hemiplegia s/p CVA    Referring Provider (PT)  Allyn Kenner    Onset Date/Surgical Date  09/18/18    Prior Therapy  Acute OT, CIR, HHOT      Precautions   Precautions  Fall      Restrictions   Weight Bearing Restrictions  No      Home Environment   Living Environment  Private residence    Living Arrangements  Children;Other (Comment)    Available Help at Discharge  Family      Prior Function   Level of Independence  Independent      Cognition   Overall Cognitive Status  Within Functional Limits for tasks assessed      Strength   Overall Strength Comments  Tested seated in wheelchair    Right Hip Flexion  4+/5    Right Hip Extension  4+/5    Right Hip ABduction  4+/5    Right Hip ADduction  4+/5    Left Hip Flexion  3-/5    Left Hip Extension  4/5    Left Hip ADduction  4-/5    Right Knee Flexion  4+/5  Right Knee Extension  5/5    Left Knee Flexion  3-/5    Left Knee Extension  4-/5      Bed Mobility   Rolling Right  Independent;Contact Guard/Touching assist    Rolling Left  Supervision/Verbal cueing;Independent    Supine to Sit  Supervision/Verbal cueing    Sit to Supine  Supervision/Verbal cueing      Transfers   Sit to Stand  6: Modified independent (Device/Increase time)    Stand to Sit  6: Modified independent (Device/Increase time)      Ambulation/Gait   Ambulation/Gait  Yes    Ambulation/Gait Assistance  5: Supervision    Ambulation Distance (Feet)  115 Feet    Assistive device  Hemi-walker    Gait Pattern  Step-to pattern;Decreased step length - right;Decreased stance time - left;Ataxic;Left flexed knee in stance;Narrow base of support    Ambulation Surface  Level;Indoor      Balance   Balance Assessed  Yes      Static Standing Balance   Static Standing - Comment/# of Minutes  Able to stand up to 45 seconds independently                            PT Education - 07/20/19 1753     Education Details  On reassessment findings, DC status and transition to home exercise.    Person(s) Educated  Patient    Methods  Explanation    Comprehension  Verbalized understanding       PT Short Term Goals - 04/18/19 1111      PT SHORT TERM GOAL #1   Title  Patient will be independent with HEP in order to optimze functional outcomes.    Baseline  Patient verbalized and demos compliance with seated marches, LAQ, hip adduciton squeeze    Time  4    Period  Weeks    Status  Achieved    Target Date  03/24/19        PT Long Term Goals - 07/20/19 1758      PT LONG TERM GOAL #1   Title  Patient will be able to ambulate at least 50 feet with LRAD  to demonstrate improved ability to access the bathroom in her home.    Baseline  Current: 115 feet with hemiwalker, SBA. Improved sequencing and safety awareness.    Time  8    Period  Weeks    Status  Achieved      PT LONG TERM GOAL #2   Title  Patient will be able to perform sit to stand with supervision in order to demonstrate improved LE strength.    Baseline  Current: able to perform  Mod I from standard height chair, but with initial cueing for body positioning, set up and LLE placement    Time  8    Period  Weeks    Status  Achieved      PT LONG TERM GOAL #3   Title  Patient will report ability to transfer to shower chair for improved ability to perform hygenic activities.    Baseline  Patient says she does not use this because it is too big, says she prefers sponge bathing    Time  8    Period  Weeks    Status  Deferred      PT LONG TERM GOAL #4   Title  Patient will be able to complete bed mobility independently  for improved ease of self care.    Baseline  Able to perform all bed mobility Mod I and with min VC for hand and leg placement    Time  8    Period  Weeks    Status  Partially Met            Plan - 07/20/19 1808    Clinical Impression Statement  Patient has made very good progress toward therapy  goals. Patient currently with 1/1 short term and  long term goals met/ partially met. Patient shows significant improvement in gait, and functional mobility, as well as transfers. Patient does require occasional verbal cueing, initially, for overall set up and technique with transfers, but with good return on subsequent attempts. Patient reports increased independence with these functions at home as well, and that she is able to do more walking and toilet transfers without direct assistance. Patient is still challenged by ongoing LLE weakness and difficulty with dynamic balance, but appears to have reached plateau with regard to improvement in therapy. Patient discharged today with max benefit of therapy reached. Educated patient on continuation of home exercise and functional mobility at home with family members to assist to maintain current level of progress. Patient instructed to follow up with physical therapy services with any further questions or concerns.    Personal Factors and Comorbidities  Age;Behavior Pattern;Comorbidity 3+    Comorbidities  CVA, hypertension, diabetes    Examination-Activity Limitations  Bathing;Bed Mobility;Bend;Caring for Others;Carry;Dressing;Hygiene/Grooming;Lift;Locomotion Level;Sit;Squat;Stairs;Stand;Toileting;Transfers    Examination-Participation Restrictions  Cleaning;Community Activity;Laundry;Meal Prep;Shop;Volunteer;Yard Work    Stability/Clinical Decision Making  Evolving/Moderate complexity    Rehab Potential  Fair    PT Treatment/Interventions  ADLs/Self Care Home Management;Aquatic Therapy;Biofeedback;Cryotherapy;Iontophoresis '4mg'$ /ml Dexamethasone;Electrical Stimulation;Moist Heat;Traction;Ultrasound;DME Instruction;Gait training;Stair training;Functional mobility training;Therapeutic activities;Therapeutic exercise;Balance training;Neuromuscular re-education;Patient/family education;Orthotic Fit/Training;Wheelchair mobility training;Manual techniques;Compression  bandaging;Passive range of motion;Dry needling;Energy conservation;Splinting;Taping;Vasopneumatic Device;Spinal Manipulations;Joint Manipulations    PT Next Visit Plan  DC    PT Home Exercise Plan  12/15: seated HS stretch, seated marching with control    Consulted and Agree with Plan of Care  Patient       Patient will benefit from skilled therapeutic intervention in order to improve the following deficits and impairments:  Abnormal gait, Decreased activity tolerance, Decreased balance, Decreased coordination, Decreased endurance, Decreased mobility, Decreased range of motion, Decreased skin integrity, Decreased strength, Difficulty walking, Impaired flexibility, Impaired sensation, Impaired tone, Improper body mechanics, Postural dysfunction, Pain  Visit Diagnosis: Other symptoms and signs involving the nervous system  Other abnormalities of gait and mobility  Muscle weakness (generalized)     Problem List Patient Active Problem List   Diagnosis Date Noted  . Spastic hemiparesis ([place])   . Labile blood glucose   . Acute pain of left shoulder   . AKI (acute kidney injury) ([place])   . Controlled type 2 diabetes mellitus with hyperglycemia, without long-term current use of insulin ([place])   . Dysphagia, post-stroke   . Atrial fibrillation ([place])   . Right middle cerebral artery stroke ([place]) 09/27/2018  . Pressure injury of skin 09/21/2018  . Cerebrovascular accident (CVA) with involvement of left side of body ([place]) 09/20/2018  . Atrial fibrillation with RVR ([place]) 09/20/2018  . Hypertension   . Rhabdomyolysis   . Type 2 diabetes mellitus ([place])   . Hyperlipidemia   . Hyperbilirubinemia   . Leukocytosis   . Hypernatremia    6:11 PM, 07/20/19 Josue Hector PT DPT  Physical Therapist with [facility]  [phone]   [facility] [place], [place], [address] Phone: [phone]   Fax:   [phone]  Name: LILLIAM CHAMBLEE MRN: [id number] Date of Birth: 08-09-1945

## 2019-08-20 DIAGNOSIS — R569 Unspecified convulsions: Secondary | ICD-10-CM | POA: Diagnosis not present

## 2019-08-20 DIAGNOSIS — R110 Nausea: Secondary | ICD-10-CM | POA: Diagnosis not present

## 2019-08-20 DIAGNOSIS — R1111 Vomiting without nausea: Secondary | ICD-10-CM | POA: Diagnosis not present

## 2019-08-20 DIAGNOSIS — R112 Nausea with vomiting, unspecified: Secondary | ICD-10-CM | POA: Diagnosis not present

## 2019-08-20 DIAGNOSIS — R001 Bradycardia, unspecified: Secondary | ICD-10-CM | POA: Diagnosis not present

## 2019-09-08 ENCOUNTER — Other Ambulatory Visit: Payer: Self-pay

## 2019-09-08 ENCOUNTER — Ambulatory Visit (INDEPENDENT_AMBULATORY_CARE_PROVIDER_SITE_OTHER): Payer: Medicare Other | Admitting: Cardiology

## 2019-09-08 ENCOUNTER — Encounter: Payer: Self-pay | Admitting: Cardiology

## 2019-09-08 VITALS — BP 150/76 | HR 52 | Ht 63.0 in

## 2019-09-08 DIAGNOSIS — E782 Mixed hyperlipidemia: Secondary | ICD-10-CM

## 2019-09-08 DIAGNOSIS — I10 Essential (primary) hypertension: Secondary | ICD-10-CM | POA: Diagnosis not present

## 2019-09-08 DIAGNOSIS — I480 Paroxysmal atrial fibrillation: Secondary | ICD-10-CM

## 2019-09-08 MED ORDER — DILTIAZEM HCL ER COATED BEADS 120 MG PO CP24: 120.0000 mg | ORAL_CAPSULE | Freq: Every day | ORAL | 3 refills | Status: DC

## 2019-09-08 NOTE — Progress Notes (Signed)
Clinical Summary Rachel Fox is a 74 y.o.female seen today for follow up of the following medical problems.   1. Afib - new diagnosis during 09/2018 admission, presented with CVA - was started on anticoag during that admission once cleared by neuro - she was managed with dilt, amio,digoxin.  - at f/u digoxin was stopped.     - no recent palpitations - compliant with meds - pcp lowered diltizem due to low heart rates down to 180mg  daily -  No bleeding on eliquis.   2. CVA - admitted 09/2018 with CVA   3. HTN - has not taken meds yet today.  - bps 110s/70s  4. Hyperlipidemia - compliant with statin - labs followed by pcp  SH: lives at home with son, has daytime aid. Past Medical History:  Diagnosis Date  . Atrial fibrillation ([place])   . CVA (cerebral vascular accident) ([place])   . High cholesterol   . Hypertension   . Type 2 diabetes mellitus ([facility])      Allergies  Allergen Reactions  . Lactose Intolerance (Gi) Other (See Comments)    Causes Gas in patient.     Current Outpatient Medications  Medication Sig Dispense Refill  . acetaminophen (TYLENOL) 325 MG tablet Take 2 tablets (650 mg total) by mouth every 4 (four) hours as needed for mild pain (or temp > 37.5 C (99.5 F)).    Marland Kitchen amiodarone (PACERONE) 200 MG tablet Take 1 tablet (200 mg total) by mouth daily. 90 tablet 3  . apixaban (ELIQUIS) 5 MG TABS tablet Take 1 tablet (5 mg total) by mouth 2 (two) times daily. 60 tablet 11  . atorvastatin (LIPITOR) 80 MG tablet Take 1 tablet (80 mg total) by mouth daily at 6 PM. 30 tablet 1  . Cholecalciferol (VITAMIN D3) 50 MCG (2000 UT) TABS Take by mouth.    . diclofenac sodium (VOLTAREN) 1 % GEL Apply 2 g topically 4 (four) times daily.    Marland Kitchen diltiazem (CARDIZEM CD) 240 MG 24 hr capsule Take 1 capsule (240 mg total) by mouth daily. 90 capsule 3  . DULoxetine (CYMBALTA) 30 MG capsule Take 30 mg by mouth daily.    Marland Kitchen HYDROcodone-acetaminophen (NORCO/VICODIN) 5-325 MG  tablet Take 1 tablet by mouth every 6 (six) hours as needed for moderate pain.    . mirabegron ER (MYRBETRIQ) 25 MG TB24 tablet Take 1 tablet (25 mg total) by mouth daily. 30 tablet   . polyethylene glycol (MIRALAX / GLYCOLAX) 17 g packet Take 17 g by mouth daily.    . potassium chloride SA (KLOR-CON) 20 MEQ tablet Take 2 tablets (40 mEq total) by mouth daily for 5 days. 10 tablet 0  . tizanidine (ZANAFLEX) 2 MG capsule Take 2 mg by mouth 3 (three) times daily.    Marland Kitchen topiramate (TOPAMAX) 25 MG tablet Take 1 tablet (25 mg total) by mouth 2 (two) times daily. 6 tablet 0  . traMADol (ULTRAM) 50 MG tablet Take 1 tablet (50 mg total) by mouth every 12 (twelve) hours as needed for severe pain. 6 tablet 0   No current facility-administered medications for this visit.     Past Surgical History:  Procedure Laterality Date  . ABDOMINAL HYSTERECTOMY    . TUBAL LIGATION       Allergies  Allergen Reactions  . Lactose Intolerance (Gi) Other (See Comments)    Causes Gas in patient.      Family History  Problem Relation Age of Onset  .  Stroke Mother   . Heart attack Father   . Stroke Sister   . Heart attack Sister      Social History Rachel Fox reports that she quit smoking about 31 years ago. She has never used smokeless tobacco. Rachel Fox reports no history of alcohol use.   Review of Systems CONSTITUTIONAL: No weight loss, fever, chills, weakness or fatigue.  HEENT: Eyes: No visual loss, blurred vision, double vision or yellow sclerae.No hearing loss, sneezing, congestion, runny nose or sore throat.  SKIN: No rash or itching.  CARDIOVASCULAR: per hpi RESPIRATORY: No shortness of breath, cough or sputum.  GASTROINTESTINAL: No anorexia, nausea, vomiting or diarrhea. No abdominal pain or blood.  GENITOURINARY: No burning on urination, no polyuria NEUROLOGICAL: No headache, dizziness, syncope, paralysis, ataxia, numbness or tingling in the extremities. No change in bowel or bladder  control.  MUSCULOSKELETAL: No muscle, back pain, joint pain or stiffness.  LYMPHATICS: No enlarged nodes. No history of splenectomy.  PSYCHIATRIC: No history of depression or anxiety.  ENDOCRINOLOGIC: No reports of sweating, cold or heat intolerance. No polyuria or polydipsia.  Marland Kitchen   Physical Examination Today's Vitals   09/08/19 0925  BP: (!) 150/76  Pulse: (!) 52  SpO2: 96%  Height: 5\' 3"  (1.6 m)   Body mass index is 20.73 kg/m.  Gen: resting comfortably, no acute distress HEENT: no scleral icterus, pupils equal round and reactive, no palptable cervical adenopathy,  CV: regular, brady at 52, no m/r/g, no jvd Resp: Clear to auscultation bilaterally GI: abdomen is soft, non-tender, non-distended, normal bowel sounds, no hepatosplenomegaly MSK: extremities are warm, no edema.  Skin: warm, no rash Neuro:  Left arm weakness Psych: appropriate affect       Assessment and Plan   1. PAF -no symptoms - heart rates remain low, we will lower dilt from 180mg  to 120mg  daily - continue eliquis  2. HTN - elevated today but has not taken meds yet, home bp's are at goal - continue current meds, monitor on lower dilt dose as reported above  3. Hyperlipidemia - continue statin, request pcp labs   F/u 6 months  , M.D

## 2019-09-08 NOTE — Addendum Note (Signed)
Addended by: Marlyn Corporal A on: 09/08/2019 10:01 AM   Modules accepted: Orders

## 2019-09-08 NOTE — Patient Instructions (Signed)
Medication Instructions:  STOP Diltiazem 180 mg    START Diltiazem 120 mg daily   *If you need a refill on your cardiac medications before your next appointment, please call your pharmacy*   Lab Work: None today If you have labs (blood work) drawn today and your tests are completely normal, you will receive your results only by:  MyChart Message (if you have MyChart) OR  A paper copy in the mail If you have any lab test that is abnormal or we need to change your treatment, we will call you to review the results.   Testing/Procedures: None today   Follow-Up: At [facility], you and your health needs are our priority.  As part of our continuing mission to provide you with exceptional heart care, we have created designated Provider Care Teams.  These Care Teams include your primary Cardiologist (physician) and Advanced Practice Providers (APPs -  Physician Assistants and Nurse Practitioners) who all work together to provide you with the care you need, when you need it.  We recommend signing up for the patient portal called "MyChart".  Sign up information is provided on this After Visit Summary.  MyChart is used to connect with patients for Virtual Visits (Telemedicine).  Patients are able to view lab/test results, encounter notes, upcoming appointments, etc.  Non-urgent messages can be sent to your provider as well.   To learn more about what you can do with MyChart, go to [email or web address].    Your next appointment:   6 month(s)  The format for your next appointment:   In Person  Provider:   Dina Rich, MD   Other Instructions None       Thank you for choosing [facility] !

## 2019-09-09 ENCOUNTER — Ambulatory Visit (HOSPITAL_COMMUNITY): Payer: Medicare Other | Attending: Internal Medicine | Admitting: Occupational Therapy

## 2019-09-09 ENCOUNTER — Encounter (HOSPITAL_COMMUNITY): Payer: Self-pay | Admitting: Occupational Therapy

## 2019-09-09 DIAGNOSIS — R29818 Other symptoms and signs involving the nervous system: Secondary | ICD-10-CM | POA: Insufficient documentation

## 2019-09-09 DIAGNOSIS — I69354 Hemiplegia and hemiparesis following cerebral infarction affecting left non-dominant side: Secondary | ICD-10-CM | POA: Insufficient documentation

## 2019-09-09 NOTE — Patient Instructions (Signed)
  Shoulder stretches: Complete all stretches 5-10x each, 3x/day.   1) Elbow flexion and extension self-ROM: Sit with your affected arm at your side.  Hold your affected arm at the wrist with the unaffected hand.  Use the unaffected arm to bend your arm until a stretch is felt and hold for 10 seconds.  Then use the unaffected arm to straighten the affected arm until a stretch is felt.      2) Wrist extension self-ROM: Interlace your fingers of both hands together as shown. Next, use the unaffected hand to assist the affected hand to move affected wrist into flexion and then extension, holding for 10 seconds while in extension; and repeat.    3) Shoulder flexion self-ROM: With caregiver assistance, or assistance from less affected limb, gently stretching arm away from the body. Hold position for 10 seconds, before releasing stretch. Perform to tolerance.    4) Finger stretches: Work on making a fist and holding fingers in flexion, then opening and stretching into extension for 10 seconds. *Only stretch into flexion or extension as far as you can tolerate*

## 2019-09-09 NOTE — Therapy (Signed)
[place], [place], [address] Phone: [phone]   Fax:  [phone]  Occupational Therapy Evaluation  Patient Details  Name: Rachel Fox MRN: [id number] Date of Birth: 1945/09/16 Referring Provider (OT): Dr. Wende Neighbors   Encounter Date: 09/09/2019   OT End of Session - 09/09/19 1205    Visit Number 1    Number of Visits 1    Date for OT Re-Evaluation 09/12/19    Authorization Type 1) Medicare A & B 2) Generic Commercial    Authorization Time Period progress note by 10th visit    Authorization - Visit Number 1    Authorization - Number of Visits 10    Progress Note Due on Visit 10    OT Start Time 1117    OT Stop Time 1157    OT Time Calculation (min) 40 min    Activity Tolerance Patient tolerated treatment well    Behavior During Therapy [facility] for tasks assessed/performed           Past Medical History:  Diagnosis Date  . Atrial fibrillation ([place])   . CVA (cerebral vascular accident) ([place])   . High cholesterol   . Hypertension   . Type 2 diabetes mellitus ([place])     Past Surgical History:  Procedure Laterality Date  . ABDOMINAL HYSTERECTOMY    . TUBAL LIGATION      There were no vitals filed for this visit.   Subjective Assessment - 09/09/19 1203    Subjective  S: I want to be able to use my arm normally.    Pertinent History Patient is 74 year old female s/p CVA on 09-18-18 and presents with spastic hemiplegia. Pt received acute OT services, CIR, and HHOT prior to referral for OP therapy. Pt has aid and son who assist with ADLs and mobility as needed. Pt has had botox shot approximately 10 days ago. Pt was referred to occupational therapy services by Dr. Wende Neighbors.    Patient Stated Goals To be able to use my left arm.    Currently in Pain? No/denies             [facility] OT Assessment - 09/09/19 1120      Assessment   Medical Diagnosis LUE spastic hemiplegia s/p CVA    Referring Provider  (OT) Dr. Wende Neighbors    Onset Date/Surgical Date 09/18/18    Prior Therapy Acute OT, CIR, HHOT (OP for PT only)      Precautions   Precautions Fall      Restrictions   Weight Bearing Restrictions No      Prior Function   Level of Independence Needs assistance with ADLs    Vocation Retired    Leisure going to [employer] center      ADL   ADL comments Pt is unable to use LUE for any tasks. Is currently using RUE for all tasks, aid and son assist with ADLs and mobility tasks as needed.       Written Expression   Dominant Hand Right      Cognition   Overall Cognitive Status Within Functional Limits for tasks assessed      Sensation   Light Touch Impaired by gross assessment    Light Touch Impaired Details Impaired LUE   able to feel light touch, dull compared to RUE     Coordination   Gross Motor Movements are Fluid and Coordinated No    Fine Motor Movements are  Fluid and Coordinated No      AROM   Overall AROM  Deficits    Overall AROM Comments Is able to activate trapezius, unable to move LUE actively      PROM   Overall PROM  Deficits    Overall PROM Comments Pt with flexor synergy in LUE, is able to tolerate passive elbow flexion of approximately 40 degrees (from 100 to 60 degrees); shoulder flexion of 76 degrees, and wrist extension from baseline of -20 to neutral. Pt with 50 abduction (0 at previous assessment)      LUE Tone   LUE Tone Brunnstrom Scale    Brunnstrom Scale (LUE) Considerable increase in muschle tone, passive movement difficult                           OT Education - 09/09/19 1204    Education Details LUE P/ROM exercises    Person(s) Educated Patient    Methods Explanation;Demonstration;Handout    Comprehension Verbalized understanding            OT Short Term Goals - 09/09/19 1246      OT SHORT TERM GOAL #1   Title Pt will be provided with and educated on HEP for passive stretching of LUE.    Time 1    Period Days    Status  Achieved    Target Date 09/09/19                    Plan - 09/09/19 1241    Clinical Impression Statement A: Pt is a 74 y/o female s/p CVA on 09/18/2018 presenting with spastic hemiplegia and inability to use LUE for ADL completion. Pt was initially assessed in 04/2019 with no OT follow up warranted due to severity of deficits. Pt was issued resting hadn splint but reports does not wear due to discomfort in fingers. Upon today's assessment, LUE continues to be at a 3 on the Modified Ashworth Scale with marked spasticity and P/ROM being difficult. LUE is positioned in adduction, IR, elbow flexion, forearm pronation, wrist flexion, and thumb adduction. Pt had botox injection in tricep region (per pt report) and pain has improved, primarily experiencing pain at hand and fingers. Discussed rehab potential with pt considering minimal gains made in past year with multiple rehab venues and severity of spasticity. Pt verbalized understanding of need to focus on compenstatory strategies using RUE and hemi- techniques for ADL completion, and reports ability to perform UB dressing independently. Aide and family assist with LB dressing and bathing tasks. Pt reports knowledge of hemi- strategies and cannot think of any tasks she would like to work on in this area. Pt is not appropriate for any further OT services focusing on her LUE due to severity of deficits and poor potential for gains. Pt verbalized understanding.    OT Occupational Profile and History Problem Focused Assessment - Including review of records relating to presenting problem    Occupational performance deficits (Please refer to evaluation for details): ADL's;IADL's;Rest and Sleep;Leisure    Body Structure / Function / Physical Skills ADL;Endurance;UE functional use;Fascial restriction;Pain;Flexibility;ROM;IADL;Strength;Mobility;Tone    Rehab Potential Poor    Clinical Decision Making Limited treatment options, no task modification necessary      Comorbidities Affecting Occupational Performance: May have comorbidities impacting occupational performance    Modification or Assistance to Complete Evaluation  Max significant modification of tasks or assist is necessary to complete    OT Frequency One  time visit    OT Treatment/Interventions Passive range of motion;Splinting;Patient/family education;Self-care/ADL training    Plan P:  Provided passive stretching for home completion. No follow up OT is appropriate at this time.    OT Home Exercise Plan eval: passive stretches for LUE    Consulted and Agree with Plan of Care Patient           Patient will benefit from skilled therapeutic intervention in order to improve the following deficits and impairments:   Body Structure / Function / Physical Skills: ADL, Endurance, UE functional use, Fascial restriction, Pain, Flexibility, ROM, IADL, Strength, Mobility, Tone       Visit Diagnosis: Other symptoms and signs involving the nervous system  Hemiplegia and hemiparesis following cerebral infarction affecting left non-dominant side [facility])    Problem List Patient Active Problem List   Diagnosis Date Noted  . Spastic hemiparesis (HCC)   . Labile blood glucose   . Acute pain of left shoulder   . AKI (acute kidney injury) (HCC)   . Controlled type 2 diabetes mellitus with hyperglycemia, without long-term current use of insulin (HCC)   . Dysphagia, post-stroke   . Atrial fibrillation (HCC)   . 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 Pressure injury of skin 09/21/2018  . Cerebrovascular accident (CVA) with involvement of left side of body (HCC) 09/20/2018  . Atrial fibrillation with RVR (HCC) 09/20/2018  . Hypertension   . Rhabdomyolysis   . Type 2 diabetes mellitus (HCC)   . Hyperlipidemia   . Hyperbilirubinemia   . Leukocytosis   . Hypernatremia    Ezra Sites, OTR/L  [phone] 09/09/2019, 12:47 PM  [facility] [address] [place], [place], [address] Phone: [phone]   Fax:  [phone]  Name: Rachel Fox MRN: [id number] Date of Birth: 1946/02/28

## 2019-09-10 ENCOUNTER — Encounter (HOSPITAL_COMMUNITY): Payer: Self-pay | Admitting: Emergency Medicine

## 2019-09-10 ENCOUNTER — Ambulatory Visit
Admission: EM | Admit: 2019-09-10 | Discharge: 2019-09-10 | Disposition: A | Discharge status: Home / Self Care | Payer: Medicare Other | Source: Home / Self Care

## 2019-09-10 ENCOUNTER — Emergency Department (HOSPITAL_COMMUNITY)
Admission: EM | Admit: 2019-09-10 | Discharge: 2019-09-10 | Disposition: A | Discharge status: Home / Self Care | Payer: Medicare Other | Attending: Emergency Medicine | Admitting: Emergency Medicine

## 2019-09-10 ENCOUNTER — Other Ambulatory Visit: Payer: Self-pay

## 2019-09-10 ENCOUNTER — Emergency Department (HOSPITAL_COMMUNITY): Payer: Medicare Other

## 2019-09-10 DIAGNOSIS — I4891 Unspecified atrial fibrillation: Secondary | ICD-10-CM | POA: Insufficient documentation

## 2019-09-10 DIAGNOSIS — Z7984 Long term (current) use of oral hypoglycemic drugs: Secondary | ICD-10-CM | POA: Diagnosis not present

## 2019-09-10 DIAGNOSIS — W1830XA Fall on same level, unspecified, initial encounter: Secondary | ICD-10-CM | POA: Insufficient documentation

## 2019-09-10 DIAGNOSIS — E1165 Type 2 diabetes mellitus with hyperglycemia: Secondary | ICD-10-CM | POA: Diagnosis not present

## 2019-09-10 DIAGNOSIS — Y9289 Other specified places as the place of occurrence of the external cause: Secondary | ICD-10-CM | POA: Diagnosis not present

## 2019-09-10 DIAGNOSIS — Z87891 Personal history of nicotine dependence: Secondary | ICD-10-CM | POA: Insufficient documentation

## 2019-09-10 DIAGNOSIS — S0083XA Contusion of other part of head, initial encounter: Secondary | ICD-10-CM | POA: Insufficient documentation

## 2019-09-10 DIAGNOSIS — Z79899 Other long term (current) drug therapy: Secondary | ICD-10-CM | POA: Insufficient documentation

## 2019-09-10 DIAGNOSIS — Y9389 Activity, other specified: Secondary | ICD-10-CM | POA: Diagnosis not present

## 2019-09-10 DIAGNOSIS — Z7901 Long term (current) use of anticoagulants: Secondary | ICD-10-CM | POA: Diagnosis not present

## 2019-09-10 DIAGNOSIS — Y998 Other external cause status: Secondary | ICD-10-CM | POA: Diagnosis not present

## 2019-09-10 DIAGNOSIS — S0993XA Unspecified injury of face, initial encounter: Secondary | ICD-10-CM | POA: Diagnosis present

## 2019-09-10 DIAGNOSIS — W19XXXA Unspecified fall, initial encounter: Secondary | ICD-10-CM

## 2019-09-10 NOTE — ED Triage Notes (Signed)
Pt reports fall last night 2300-0000 roughly after using the BR and was attempting to get back into her wheelchair; pt reports she fell into her wheelchair hitting the left side of her face, denies LOC, left eye swollen shut; pt has prior stroke from 09/2018 with left sided deficits, pt takes eliquis

## 2019-09-10 NOTE — ED Provider Notes (Signed)
[facility] EMERGENCY DEPARTMENT Provider Note   CSN: [id number] Arrival date & time: 09/10/19  1155     History Chief Complaint  Patient presents with   Rachel Fox    Rachel Fox is a 74 y.o. female.  Pt presents to the ED today with a fall and facial trauma.  Pt said she fell trying to get back into her wheelchair last night after using the restroom around 2200. She hit the left side of her face.  No loc.  She is on Eliquis due to hx of afib and CVA.        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     Family History  Problem Relation Age of Onset   Stroke Mother    Heart attack Father    Stroke Sister    Heart attack Sister     Social History   Tobacco Use   Smoking status: Former Smoker    Quit date: 04/14/1988    Years since quitting: 31.4   Smokeless tobacco: Never Used  Vaping Use   Vaping Use: Never used  Substance Use Topics    Alcohol use: Never   Drug use: Never    Home Medications Prior to Admission medications   Medication Sig Start Date End Date Taking? Authorizing Provider  acetaminophen (TYLENOL) 325 MG tablet Take 2 tablets (650 mg total) by mouth every 4 (four) hours as needed for mild pain (or temp > 37.5 C (99.5 F)). 10/29/18   Angiulli, Mcarthur Rossetti, PA-C  amiodarone (PACERONE) 200 MG tablet Take 1 tablet (200 mg total) by mouth daily. 12/01/18   Strader, Lennart Pall, PA-C  apixaban (ELIQUIS) 5 MG TABS tablet Take 1 tablet (5 mg total) by mouth 2 (two) times daily. 12/01/18 11/26/19  Strader, Lennart Pall, PA-C  atorvastatin (LIPITOR) 80 MG tablet Take 1 tablet (80 mg total) by mouth daily at 6 PM. 09/27/18 09/08/19  Amin, Loura Halt, MD  Cholecalciferol (VITAMIN D3) 50 MCG (2000 UT) TABS Take by mouth.    [provider]  diclofenac sodium (VOLTAREN) 1 % GEL Apply 2 g topically 4 (four) times daily. 10/29/18   Angiulli, Mcarthur Rossetti, PA-C  diltiazem (CARDIZEM CD) 120 MG 24 hr capsule Take 1 capsule (120 mg total) by mouth daily. 09/08/19   Dina Rich  F, MD  DULoxetine (CYMBALTA) 30 MG capsule Take 30 mg by mouth daily.    [provider]  HYDROcodone-acetaminophen (NORCO/VICODIN) 5-325 MG tablet Take 1 tablet by mouth every 6 (six) hours as needed for moderate pain.    [provider]  mirabegron ER (MYRBETRIQ) 25 MG TB24 tablet Take 1 tablet (25 mg total) by mouth daily. 11/01/18   Angiulli, Mcarthur Rossettianiel J, PA-C  polyethylene glycol (MIRALAX / GLYCOLAX) 17 g packet Take 17 g by mouth daily.    [provider]  potassium chloride SA (KLOR-CON) 20 MEQ tablet Take 2 tablets (40 mEq total) by mouth daily for 5 days. 01/10/19 09/08/19  Long, Arlyss RepressJoshua G, MD  tizanidine (ZANAFLEX) 2 MG capsule Take 2 mg by mouth 3 (three) times daily.    [provider]  topiramate (TOPAMAX) 25 MG tablet Take 1 tablet (25 mg total) by mouth 2 (two) times daily. 10/29/18   Angiulli, Mcarthur Rossettianiel J, PA-C    traMADol (ULTRAM) 50 MG tablet Take 1 tablet (50 mg total) by mouth every 12 (twelve) hours as needed for severe pain. 10/29/18   Angiulli, Mcarthur Rossettianiel J, PA-C    Allergies    Lactose intolerance (gi)  Review of Systems   Review of Systems  HENT: Positive for facial swelling.   All other systems reviewed and are negative.   Physical Exam Updated Vital Signs BP (!) 163/68 (BP Location: Right Arm)    Pulse (!) 58    Temp 98.3 F (36.8 C) (Oral)    Resp 18    Ht 5\' 3"  (1.6 m)    Wt 54.4 kg    SpO2 100%    BMI 21.26 kg/m   Physical Exam Vitals and nursing note reviewed.  Constitutional:      Appearance: Normal appearance.  HENT:     Head: Normocephalic and atraumatic.     Comments: Swelling and bruising around left eye    Right Ear: External ear normal.     Left Ear: External ear normal.     Nose: Nose normal.     Mouth/Throat:     Mouth: Mucous membranes are moist.     Pharynx: Oropharynx is clear.  Eyes:     Extraocular Movements: Extraocular movements intact.     Conjunctiva/sclera: Conjunctivae normal.     Pupils: Pupils are equal, round, and reactive to light.     Comments: With the nurse's help holding her swollen upper and lower lids, I was able to examine pt's eye.  No blurry vision.  EOMI without pain.  Cardiovascular:     Rate and Rhythm: Normal rate and regular rhythm.     Pulses: Normal pulses.     Heart sounds: Normal heart sounds.  Pulmonary:     Effort: Pulmonary effort is normal.     Breath sounds: Normal breath sounds.  Abdominal:     General: Abdomen is flat. Bowel sounds are normal.     Palpations: Abdomen is soft.  Musculoskeletal:     Cervical back: Normal range of motion and neck supple.     Comments: Left arm contracture  Skin:    General: Skin is warm.     Capillary Refill: Capillary refill takes less than 2 seconds.  Neurological:     Mental Status: She is alert and oriented to person, place, and time. Mental status is at baseline.     Comments:  Left arm contractured/paralyzed.  Left leg weak.   Psychiatric:  Mood and Affect: Mood normal.        Behavior: Behavior normal.     ED Results / Procedures / Treatments   Labs (all labs ordered are listed, but only abnormal results are displayed) Labs Reviewed - No data to display  EKG EKG Interpretation  Date/Time:  Saturday September 10 2019 12:26:07 EDT Ventricular Rate:  57 PR Interval:    QRS Duration: 115 QT Interval:  500 QTC Calculation: 487 R Axis:   61 Text Interpretation: Sinus rhythm Probable left atrial enlargement Nonspecific intraventricular conduction delay Borderline T abnormalities, anterior leads Artifact in lead(s) I III aVR aVL aVF V1 V2 V3 V4 V5 V6 No significant change since last tracing Confirmed by Jacalyn Lefevre [phone]) on 09/10/2019 1:48:48 PM   Radiology CT Head Wo Contrast  Result Date: 09/10/2019 CLINICAL DATA:  Rachel Fox last night after using the bathroom attempting to get back into her wheelchair, struck LEFT side of face, LEFT eye swelling, denies loss of consciousness; history diabetes mellitus, hypertension, prior stroke with LEFT-sided deficits EXAM: CT HEAD WITHOUT CONTRAST CT MAXILLOFACIAL WITHOUT CONTRAST CT CERVICAL SPINE WITHOUT CONTRAST TECHNIQUE: Multidetector CT imaging of the head, cervical spine, and maxillofacial structures were performed using the standard protocol without intravenous contrast. Multiplanar CT image reconstructions of the cervical spine and maxillofacial structures were also generated. Right side of face marked with BB. COMPARISON:  CT head 01/10/2019 FINDINGS: CT HEAD FINDINGS Brain: Minimal atrophy. Stable ventricular morphology with slight ex vacuo dilatation of the RIGHT lateral ventricle versus LEFT. No midline shift or mass effect. Old RIGHT MCA territory infarct. Mild small vessel chronic ischemic changes of deep cerebral white matter. No intracranial hemorrhage, mass lesion or evidence of acute infarction. No  extra-axial fluid collections. Vascular: Mild atherosclerotic calcification of internal carotid arteries at skull base. No hyperdense vessels. Skull: Calvaria intact Other: N/A CT MAXILLOFACIAL FINDINGS Osseous: Few scattered beam hardening artifacts of dental origin. TMJ alignment normal bilaterally. Facial bones intact. No facial bone fractures identified. Orbits: Bony orbits intact.  No intraorbital edema or pneumatosis. Sinuses: Mucosal retention cyst and minimal mucosal thickening LEFT maxillary sinus. Remaining paranasal sinuses, mastoid air cells, and middle ear cavities clear Soft tissues: Extensive LEFT periorbital hematoma and soft tissue swelling extending into LEFT cheek. Remaining soft tissues unremarkable. CT CERVICAL SPINE FINDINGS Alignment: Normal Skull base and vertebrae: Mild osseous demineralization. Skull base intact. Multilevel mild facet degenerative changes. Multilevel disc space narrowing and endplate spur formation. Vertebral body heights maintained without fracture or subluxation. No bone destruction. Encroachment upon scattered cervical neural foramina by uncovertebral spurs bilaterally greatest at C4-C5. Soft tissues and spinal canal: Prevertebral soft tissues normal thickness. Cervical soft tissues otherwise unremarkable. Disc levels:  No specific abnormalities Upper chest: Lung apices clear Other: N/A IMPRESSION: Atrophy with small vessel chronic ischemic changes of deep cerebral white matter. Old RIGHT MCA territory infarct. No acute intracranial abnormalities. Extensive LEFT periorbital and facial soft tissue swelling/hematoma. No acute facial bone abnormalities. Degenerative disc and facet disease changes of the cervical spine. No acute cervical spine abnormalities. Electronically Signed   By: Ulyses Southward M.D.   On: 09/10/2019 14:43   CT Cervical Spine Wo Contrast  Result Date: 09/10/2019 CLINICAL DATA:  Rachel Fox last night after using the bathroom attempting to get back into her  wheelchair, struck LEFT side of face, LEFT eye swelling, denies loss of consciousness; history diabetes mellitus, hypertension, prior stroke with LEFT-sided deficits EXAM: CT HEAD WITHOUT CONTRAST CT MAXILLOFACIAL WITHOUT CONTRAST CT CERVICAL SPINE WITHOUT CONTRAST  TECHNIQUE: Multidetector CT imaging of the head, cervical spine, and maxillofacial structures were performed using the standard protocol without intravenous contrast. Multiplanar CT image reconstructions of the cervical spine and maxillofacial structures were also generated. Right side of face marked with BB. COMPARISON:  CT head 01/10/2019 FINDINGS: CT HEAD FINDINGS Brain: Minimal atrophy. Stable ventricular morphology with slight ex vacuo dilatation of the RIGHT lateral ventricle versus LEFT. No midline shift or mass effect. Old RIGHT MCA territory infarct. Mild small vessel chronic ischemic changes of deep cerebral white matter. No intracranial hemorrhage, mass lesion or evidence of acute infarction. No extra-axial fluid collections. Vascular: Mild atherosclerotic calcification of internal carotid arteries at skull base. No hyperdense vessels. Skull: Calvaria intact Other: N/A CT MAXILLOFACIAL FINDINGS Osseous: Few scattered beam hardening artifacts of dental origin. TMJ alignment normal bilaterally. Facial bones intact. No facial bone fractures identified. Orbits: Bony orbits intact.  No intraorbital edema or pneumatosis. Sinuses: Mucosal retention cyst and minimal mucosal thickening LEFT maxillary sinus. Remaining paranasal sinuses, mastoid air cells, and middle ear cavities clear Soft tissues: Extensive LEFT periorbital hematoma and soft tissue swelling extending into LEFT cheek. Remaining soft tissues unremarkable. CT CERVICAL SPINE FINDINGS Alignment: Normal Skull base and vertebrae: Mild osseous demineralization. Skull base intact. Multilevel mild facet degenerative changes. Multilevel disc space narrowing and endplate spur formation. Vertebral  body heights maintained without fracture or subluxation. No bone destruction. Encroachment upon scattered cervical neural foramina by uncovertebral spurs bilaterally greatest at C4-C5. Soft tissues and spinal canal: Prevertebral soft tissues normal thickness. Cervical soft tissues otherwise unremarkable. Disc levels:  No specific abnormalities Upper chest: Lung apices clear Other: N/A IMPRESSION: Atrophy with small vessel chronic ischemic changes of deep cerebral white matter. Old RIGHT MCA territory infarct. No acute intracranial abnormalities. Extensive LEFT periorbital and facial soft tissue swelling/hematoma. No acute facial bone abnormalities. Degenerative disc and facet disease changes of the cervical spine. No acute cervical spine abnormalities. Electronically Signed   By: Lavonia Dana M.D.   On: 09/10/2019 14:43   CT Maxillofacial Wo Contrast  Result Date: 09/10/2019 CLINICAL DATA:  Rachel Fox last night after using the bathroom attempting to get back into her wheelchair, struck LEFT side of face, LEFT eye swelling, denies loss of consciousness; history diabetes mellitus, hypertension, prior stroke with LEFT-sided deficits EXAM: CT HEAD WITHOUT CONTRAST CT MAXILLOFACIAL WITHOUT CONTRAST CT CERVICAL SPINE WITHOUT CONTRAST TECHNIQUE: Multidetector CT imaging of the head, cervical spine, and maxillofacial structures were performed using the standard protocol without intravenous contrast. Multiplanar CT image reconstructions of the cervical spine and maxillofacial structures were also generated. Right side of face marked with BB. COMPARISON:  CT head 01/10/2019 FINDINGS: CT HEAD FINDINGS Brain: Minimal atrophy. Stable ventricular morphology with slight ex vacuo dilatation of the RIGHT lateral ventricle versus LEFT. No midline shift or mass effect. Old RIGHT MCA territory infarct. Mild small vessel chronic ischemic changes of deep cerebral white matter. No intracranial hemorrhage, mass lesion or evidence of acute  infarction. No extra-axial fluid collections. Vascular: Mild atherosclerotic calcification of internal carotid arteries at skull base. No hyperdense vessels. Skull: Calvaria intact Other: N/A CT MAXILLOFACIAL FINDINGS Osseous: Few scattered beam hardening artifacts of dental origin. TMJ alignment normal bilaterally. Facial bones intact. No facial bone fractures identified. Orbits: Bony orbits intact.  No intraorbital edema or pneumatosis. Sinuses: Mucosal retention cyst and minimal mucosal thickening LEFT maxillary sinus. Remaining paranasal sinuses, mastoid air cells, and middle ear cavities clear Soft tissues: Extensive LEFT periorbital hematoma and soft tissue swelling extending into LEFT  cheek. Remaining soft tissues unremarkable. CT CERVICAL SPINE FINDINGS Alignment: Normal Skull base and vertebrae: Mild osseous demineralization. Skull base intact. Multilevel mild facet degenerative changes. Multilevel disc space narrowing and endplate spur formation. Vertebral body heights maintained without fracture or subluxation. No bone destruction. Encroachment upon scattered cervical neural foramina by uncovertebral spurs bilaterally greatest at C4-C5. Soft tissues and spinal canal: Prevertebral soft tissues normal thickness. Cervical soft tissues otherwise unremarkable. Disc levels:  No specific abnormalities Upper chest: Lung apices clear Other: N/A IMPRESSION: Atrophy with small vessel chronic ischemic changes of deep cerebral white matter. Old RIGHT MCA territory infarct. No acute intracranial abnormalities. Extensive LEFT periorbital and facial soft tissue swelling/hematoma. No acute facial bone abnormalities. Degenerative disc and facet disease changes of the cervical spine. No acute cervical spine abnormalities. Electronically Signed   By: Ulyses Southward M.D.   On: 09/10/2019 14:43    Procedures Procedures (including critical care time)  Medications Ordered in ED Medications - No data to display  ED Course    I have reviewed the triage vital signs and the nursing notes.  Pertinent labs & imaging results that were available during my care of the patient were reviewed by me and considered in my medical decision making (see chart for details).    MDM Rules/Calculators/A&P                          Pt did not want anything for pain.    No internal injury.  Pt is stable for d/c.  Return if worse. Final Clinical Impression(s) / ED Diagnoses Final diagnoses:  Fall, initial encounter  Facial hematoma, initial encounter    Rx / DC Orders ED Discharge Orders    None       Jacalyn Lefevre, MD 09/10/19 1453

## 2019-09-10 NOTE — ED Triage Notes (Signed)
Pt had fall from bed last night hit head and has swelling to left eye. Pt denies pain but has H/O CVA and has left side deficit . Provider made aware.  Patient is being discharged from the Urgent Care and sent to the Emergency Department via private vehicle  . Per B. Wurst , patient is in need of higher level of care due to rule out brain bleed . Patient is aware and verbalizes understanding of plan of care. There were no vitals filed for this visit.

## 2019-10-07 DIAGNOSIS — S0083XA Contusion of other part of head, initial encounter: Secondary | ICD-10-CM | POA: Diagnosis not present

## 2019-10-07 DIAGNOSIS — I10 Essential (primary) hypertension: Secondary | ICD-10-CM | POA: Diagnosis not present

## 2019-10-07 DIAGNOSIS — E119 Type 2 diabetes mellitus without complications: Secondary | ICD-10-CM | POA: Diagnosis not present

## 2019-10-07 DIAGNOSIS — I480 Paroxysmal atrial fibrillation: Secondary | ICD-10-CM | POA: Diagnosis not present

## 2019-10-07 DIAGNOSIS — E7849 Other hyperlipidemia: Secondary | ICD-10-CM | POA: Diagnosis not present

## 2019-10-07 DIAGNOSIS — Z7901 Long term (current) use of anticoagulants: Secondary | ICD-10-CM | POA: Diagnosis not present

## 2019-10-12 DIAGNOSIS — G8112 Spastic hemiplegia affecting left dominant side: Secondary | ICD-10-CM | POA: Diagnosis not present

## 2019-10-12 DIAGNOSIS — R270 Ataxia, unspecified: Secondary | ICD-10-CM | POA: Diagnosis not present

## 2019-10-12 DIAGNOSIS — I10 Essential (primary) hypertension: Secondary | ICD-10-CM | POA: Diagnosis not present

## 2019-10-13 DIAGNOSIS — M2041 Other hammer toe(s) (acquired), right foot: Secondary | ICD-10-CM | POA: Diagnosis not present

## 2019-10-13 DIAGNOSIS — M21372 Foot drop, left foot: Secondary | ICD-10-CM | POA: Diagnosis not present

## 2019-10-13 DIAGNOSIS — E1151 Type 2 diabetes mellitus with diabetic peripheral angiopathy without gangrene: Secondary | ICD-10-CM | POA: Diagnosis not present

## 2019-10-13 DIAGNOSIS — M79674 Pain in right toe(s): Secondary | ICD-10-CM | POA: Diagnosis not present

## 2019-10-13 DIAGNOSIS — B351 Tinea unguium: Secondary | ICD-10-CM | POA: Diagnosis not present

## 2019-10-21 DIAGNOSIS — I10 Essential (primary) hypertension: Secondary | ICD-10-CM | POA: Diagnosis not present

## 2019-10-21 DIAGNOSIS — R944 Abnormal results of kidney function studies: Secondary | ICD-10-CM | POA: Diagnosis not present

## 2019-10-21 DIAGNOSIS — E119 Type 2 diabetes mellitus without complications: Secondary | ICD-10-CM | POA: Diagnosis not present

## 2019-10-21 DIAGNOSIS — E1165 Type 2 diabetes mellitus with hyperglycemia: Secondary | ICD-10-CM | POA: Diagnosis not present

## 2019-10-21 DIAGNOSIS — E782 Mixed hyperlipidemia: Secondary | ICD-10-CM | POA: Diagnosis not present

## 2019-10-23 DIAGNOSIS — I4891 Unspecified atrial fibrillation: Secondary | ICD-10-CM | POA: Diagnosis not present

## 2019-10-23 DIAGNOSIS — R42 Dizziness and giddiness: Secondary | ICD-10-CM | POA: Diagnosis not present

## 2019-10-25 DIAGNOSIS — N3281 Overactive bladder: Secondary | ICD-10-CM | POA: Diagnosis not present

## 2019-10-25 DIAGNOSIS — E782 Mixed hyperlipidemia: Secondary | ICD-10-CM | POA: Diagnosis not present

## 2019-10-25 DIAGNOSIS — K219 Gastro-esophageal reflux disease without esophagitis: Secondary | ICD-10-CM | POA: Diagnosis not present

## 2019-10-25 DIAGNOSIS — E43 Unspecified severe protein-calorie malnutrition: Secondary | ICD-10-CM | POA: Diagnosis not present

## 2019-10-25 DIAGNOSIS — G40009 Localization-related (focal) (partial) idiopathic epilepsy and epileptic syndromes with seizures of localized onset, not intractable, without status epilepticus: Secondary | ICD-10-CM | POA: Diagnosis not present

## 2019-10-25 DIAGNOSIS — M24522 Contracture, left elbow: Secondary | ICD-10-CM | POA: Diagnosis not present

## 2019-10-25 DIAGNOSIS — I69354 Hemiplegia and hemiparesis following cerebral infarction affecting left non-dominant side: Secondary | ICD-10-CM | POA: Diagnosis not present

## 2019-10-25 DIAGNOSIS — R110 Nausea: Secondary | ICD-10-CM | POA: Diagnosis not present

## 2019-10-25 DIAGNOSIS — K5909 Other constipation: Secondary | ICD-10-CM | POA: Diagnosis not present

## 2019-10-25 DIAGNOSIS — E1165 Type 2 diabetes mellitus with hyperglycemia: Secondary | ICD-10-CM | POA: Diagnosis not present

## 2019-10-25 DIAGNOSIS — I480 Paroxysmal atrial fibrillation: Secondary | ICD-10-CM | POA: Diagnosis not present

## 2019-11-02 DIAGNOSIS — G40219 Localization-related (focal) (partial) symptomatic epilepsy and epileptic syndromes with complex partial seizures, intractable, without status epilepticus: Secondary | ICD-10-CM | POA: Diagnosis not present

## 2019-11-02 DIAGNOSIS — G8112 Spastic hemiplegia affecting left dominant side: Secondary | ICD-10-CM | POA: Diagnosis not present

## 2019-11-02 DIAGNOSIS — I10 Essential (primary) hypertension: Secondary | ICD-10-CM | POA: Diagnosis not present

## 2019-11-02 DIAGNOSIS — R270 Ataxia, unspecified: Secondary | ICD-10-CM | POA: Diagnosis not present

## 2019-11-17 ENCOUNTER — Other Ambulatory Visit (HOSPITAL_COMMUNITY): Payer: Self-pay | Admitting: *Deleted

## 2019-11-17 ENCOUNTER — Other Ambulatory Visit: Payer: Self-pay

## 2019-11-17 ENCOUNTER — Ambulatory Visit (HOSPITAL_COMMUNITY)
Admission: RE | Admit: 2019-11-17 | Discharge: 2019-11-17 | Disposition: A | Discharge status: Home / Self Care | Payer: Medicare Other | Source: Ambulatory Visit | Attending: Neurology | Admitting: Neurology

## 2019-11-17 DIAGNOSIS — R400 Somnolence: Secondary | ICD-10-CM | POA: Insufficient documentation

## 2019-11-17 DIAGNOSIS — G40909 Epilepsy, unspecified, not intractable, without status epilepticus: Secondary | ICD-10-CM | POA: Insufficient documentation

## 2019-11-17 DIAGNOSIS — R569 Unspecified convulsions: Secondary | ICD-10-CM | POA: Diagnosis not present

## 2019-11-17 NOTE — Procedures (Signed)
  [facility] Neyda Durango A. Gerilyn Pilgrim, MD     [email or web address]           HISTORY: The patient is 74 year old who presents with new onset epileptic seizures.  MEDICATIONS: Scheduled Meds: Continuous Infusions: PRN Meds:.     ANALYSIS: A 16 channel recording using standard 10 20 measurements is conducted for 26 minutes. There is a well-formed posterior dominant rhythm that is asymmetric. It is 10 hertz on the right and 12 hertz on the left. It attenuates with eye opening. There is beta activity observed in the frontal areas. The recording transitions to drowsiness with the generalized theta slowing.  Again, the slowing is more pronounced on the left side the even in drowsiness. Photic stimulation and hyperventilation are not carried out. Epileptiform discharges are not noted.  IMPRESSION: 1.  This recording of the awake and drowsy stage shows relative slowing on the right side compared to the left side.  This indicate right hemispheric dysfunction. However, no epileptiform discharges are noted.      Jason Frisbee A. Gerilyn Pilgrim, M.D.  Diplomate, Biomedical engineer of Psychiatry and Neurology ( Neurology).

## 2019-11-17 NOTE — Progress Notes (Signed)
OP EEG completed, results pending. 

## 2019-11-25 DIAGNOSIS — M79622 Pain in left upper arm: Secondary | ICD-10-CM | POA: Diagnosis not present

## 2019-11-25 DIAGNOSIS — G8112 Spastic hemiplegia affecting left dominant side: Secondary | ICD-10-CM | POA: Diagnosis not present

## 2019-11-30 DIAGNOSIS — E782 Mixed hyperlipidemia: Secondary | ICD-10-CM | POA: Diagnosis not present

## 2019-11-30 DIAGNOSIS — N3281 Overactive bladder: Secondary | ICD-10-CM | POA: Diagnosis not present

## 2019-11-30 DIAGNOSIS — I480 Paroxysmal atrial fibrillation: Secondary | ICD-10-CM | POA: Diagnosis not present

## 2019-11-30 DIAGNOSIS — R110 Nausea: Secondary | ICD-10-CM | POA: Diagnosis not present

## 2019-11-30 DIAGNOSIS — M24522 Contracture, left elbow: Secondary | ICD-10-CM | POA: Diagnosis not present

## 2019-11-30 DIAGNOSIS — E1165 Type 2 diabetes mellitus with hyperglycemia: Secondary | ICD-10-CM | POA: Diagnosis not present

## 2019-11-30 DIAGNOSIS — E43 Unspecified severe protein-calorie malnutrition: Secondary | ICD-10-CM | POA: Diagnosis not present

## 2019-11-30 DIAGNOSIS — K219 Gastro-esophageal reflux disease without esophagitis: Secondary | ICD-10-CM | POA: Diagnosis not present

## 2019-11-30 DIAGNOSIS — G40009 Localization-related (focal) (partial) idiopathic epilepsy and epileptic syndromes with seizures of localized onset, not intractable, without status epilepticus: Secondary | ICD-10-CM | POA: Diagnosis not present

## 2019-11-30 DIAGNOSIS — K5909 Other constipation: Secondary | ICD-10-CM | POA: Diagnosis not present

## 2019-11-30 DIAGNOSIS — I69354 Hemiplegia and hemiparesis following cerebral infarction affecting left non-dominant side: Secondary | ICD-10-CM | POA: Diagnosis not present

## 2019-12-20 DIAGNOSIS — G8114 Spastic hemiplegia affecting left nondominant side: Secondary | ICD-10-CM | POA: Diagnosis not present

## 2019-12-20 DIAGNOSIS — M6281 Muscle weakness (generalized): Secondary | ICD-10-CM | POA: Diagnosis not present

## 2019-12-20 DIAGNOSIS — R279 Unspecified lack of coordination: Secondary | ICD-10-CM | POA: Diagnosis not present

## 2019-12-20 DIAGNOSIS — I10 Essential (primary) hypertension: Secondary | ICD-10-CM | POA: Diagnosis not present

## 2019-12-20 DIAGNOSIS — I4891 Unspecified atrial fibrillation: Secondary | ICD-10-CM | POA: Diagnosis not present

## 2019-12-21 DIAGNOSIS — Z23 Encounter for immunization: Secondary | ICD-10-CM | POA: Diagnosis not present

## 2019-12-22 DIAGNOSIS — G8114 Spastic hemiplegia affecting left nondominant side: Secondary | ICD-10-CM | POA: Diagnosis not present

## 2019-12-22 DIAGNOSIS — I4891 Unspecified atrial fibrillation: Secondary | ICD-10-CM | POA: Diagnosis not present

## 2019-12-22 DIAGNOSIS — R279 Unspecified lack of coordination: Secondary | ICD-10-CM | POA: Diagnosis not present

## 2019-12-22 DIAGNOSIS — I10 Essential (primary) hypertension: Secondary | ICD-10-CM | POA: Diagnosis not present

## 2019-12-22 DIAGNOSIS — M6281 Muscle weakness (generalized): Secondary | ICD-10-CM | POA: Diagnosis not present

## 2019-12-27 DIAGNOSIS — M6281 Muscle weakness (generalized): Secondary | ICD-10-CM | POA: Diagnosis not present

## 2019-12-27 DIAGNOSIS — G8114 Spastic hemiplegia affecting left nondominant side: Secondary | ICD-10-CM | POA: Diagnosis not present

## 2019-12-27 DIAGNOSIS — I4891 Unspecified atrial fibrillation: Secondary | ICD-10-CM | POA: Diagnosis not present

## 2019-12-27 DIAGNOSIS — R279 Unspecified lack of coordination: Secondary | ICD-10-CM | POA: Diagnosis not present

## 2019-12-27 DIAGNOSIS — I10 Essential (primary) hypertension: Secondary | ICD-10-CM | POA: Diagnosis not present

## 2019-12-29 DIAGNOSIS — R279 Unspecified lack of coordination: Secondary | ICD-10-CM | POA: Diagnosis not present

## 2019-12-29 DIAGNOSIS — I10 Essential (primary) hypertension: Secondary | ICD-10-CM | POA: Diagnosis not present

## 2019-12-29 DIAGNOSIS — M6281 Muscle weakness (generalized): Secondary | ICD-10-CM | POA: Diagnosis not present

## 2019-12-29 DIAGNOSIS — G8114 Spastic hemiplegia affecting left nondominant side: Secondary | ICD-10-CM | POA: Diagnosis not present

## 2019-12-29 DIAGNOSIS — I4891 Unspecified atrial fibrillation: Secondary | ICD-10-CM | POA: Diagnosis not present

## 2019-12-30 DIAGNOSIS — E1165 Type 2 diabetes mellitus with hyperglycemia: Secondary | ICD-10-CM | POA: Diagnosis not present

## 2019-12-30 DIAGNOSIS — K5909 Other constipation: Secondary | ICD-10-CM | POA: Diagnosis not present

## 2019-12-30 DIAGNOSIS — G40009 Localization-related (focal) (partial) idiopathic epilepsy and epileptic syndromes with seizures of localized onset, not intractable, without status epilepticus: Secondary | ICD-10-CM | POA: Diagnosis not present

## 2019-12-30 DIAGNOSIS — R110 Nausea: Secondary | ICD-10-CM | POA: Diagnosis not present

## 2019-12-30 DIAGNOSIS — M24522 Contracture, left elbow: Secondary | ICD-10-CM | POA: Diagnosis not present

## 2019-12-30 DIAGNOSIS — K219 Gastro-esophageal reflux disease without esophagitis: Secondary | ICD-10-CM | POA: Diagnosis not present

## 2019-12-30 DIAGNOSIS — I69354 Hemiplegia and hemiparesis following cerebral infarction affecting left non-dominant side: Secondary | ICD-10-CM | POA: Diagnosis not present

## 2019-12-30 DIAGNOSIS — E782 Mixed hyperlipidemia: Secondary | ICD-10-CM | POA: Diagnosis not present

## 2019-12-30 DIAGNOSIS — N3281 Overactive bladder: Secondary | ICD-10-CM | POA: Diagnosis not present

## 2019-12-30 DIAGNOSIS — E43 Unspecified severe protein-calorie malnutrition: Secondary | ICD-10-CM | POA: Diagnosis not present

## 2019-12-30 DIAGNOSIS — I480 Paroxysmal atrial fibrillation: Secondary | ICD-10-CM | POA: Diagnosis not present

## 2020-01-02 DIAGNOSIS — G40219 Localization-related (focal) (partial) symptomatic epilepsy and epileptic syndromes with complex partial seizures, intractable, without status epilepticus: Secondary | ICD-10-CM | POA: Diagnosis not present

## 2020-01-02 DIAGNOSIS — R270 Ataxia, unspecified: Secondary | ICD-10-CM | POA: Diagnosis not present

## 2020-01-02 DIAGNOSIS — G40409 Other generalized epilepsy and epileptic syndromes, not intractable, without status epilepticus: Secondary | ICD-10-CM | POA: Diagnosis not present

## 2020-01-02 DIAGNOSIS — I10 Essential (primary) hypertension: Secondary | ICD-10-CM | POA: Diagnosis not present

## 2020-01-02 DIAGNOSIS — G8112 Spastic hemiplegia affecting left dominant side: Secondary | ICD-10-CM | POA: Diagnosis not present

## 2020-01-03 DIAGNOSIS — G8114 Spastic hemiplegia affecting left nondominant side: Secondary | ICD-10-CM | POA: Diagnosis not present

## 2020-01-03 DIAGNOSIS — I4891 Unspecified atrial fibrillation: Secondary | ICD-10-CM | POA: Diagnosis not present

## 2020-01-03 DIAGNOSIS — M6281 Muscle weakness (generalized): Secondary | ICD-10-CM | POA: Diagnosis not present

## 2020-01-03 DIAGNOSIS — R279 Unspecified lack of coordination: Secondary | ICD-10-CM | POA: Diagnosis not present

## 2020-01-03 DIAGNOSIS — I10 Essential (primary) hypertension: Secondary | ICD-10-CM | POA: Diagnosis not present

## 2020-01-05 DIAGNOSIS — G8114 Spastic hemiplegia affecting left nondominant side: Secondary | ICD-10-CM | POA: Diagnosis not present

## 2020-01-05 DIAGNOSIS — I4891 Unspecified atrial fibrillation: Secondary | ICD-10-CM | POA: Diagnosis not present

## 2020-01-05 DIAGNOSIS — R279 Unspecified lack of coordination: Secondary | ICD-10-CM | POA: Diagnosis not present

## 2020-01-05 DIAGNOSIS — M6281 Muscle weakness (generalized): Secondary | ICD-10-CM | POA: Diagnosis not present

## 2020-01-05 DIAGNOSIS — I10 Essential (primary) hypertension: Secondary | ICD-10-CM | POA: Diagnosis not present

## 2020-01-11 DIAGNOSIS — I10 Essential (primary) hypertension: Secondary | ICD-10-CM | POA: Diagnosis not present

## 2020-01-11 DIAGNOSIS — R279 Unspecified lack of coordination: Secondary | ICD-10-CM | POA: Diagnosis not present

## 2020-01-11 DIAGNOSIS — M6281 Muscle weakness (generalized): Secondary | ICD-10-CM | POA: Diagnosis not present

## 2020-01-11 DIAGNOSIS — I4891 Unspecified atrial fibrillation: Secondary | ICD-10-CM | POA: Diagnosis not present

## 2020-01-11 DIAGNOSIS — G8114 Spastic hemiplegia affecting left nondominant side: Secondary | ICD-10-CM | POA: Diagnosis not present

## 2020-01-12 DIAGNOSIS — I4891 Unspecified atrial fibrillation: Secondary | ICD-10-CM | POA: Diagnosis not present

## 2020-01-12 DIAGNOSIS — R279 Unspecified lack of coordination: Secondary | ICD-10-CM | POA: Diagnosis not present

## 2020-01-12 DIAGNOSIS — M6281 Muscle weakness (generalized): Secondary | ICD-10-CM | POA: Diagnosis not present

## 2020-01-12 DIAGNOSIS — I10 Essential (primary) hypertension: Secondary | ICD-10-CM | POA: Diagnosis not present

## 2020-01-12 DIAGNOSIS — G8114 Spastic hemiplegia affecting left nondominant side: Secondary | ICD-10-CM | POA: Diagnosis not present

## 2020-01-17 DIAGNOSIS — M6281 Muscle weakness (generalized): Secondary | ICD-10-CM | POA: Diagnosis not present

## 2020-01-17 DIAGNOSIS — R279 Unspecified lack of coordination: Secondary | ICD-10-CM | POA: Diagnosis not present

## 2020-01-17 DIAGNOSIS — G8114 Spastic hemiplegia affecting left nondominant side: Secondary | ICD-10-CM | POA: Diagnosis not present

## 2020-01-17 DIAGNOSIS — I10 Essential (primary) hypertension: Secondary | ICD-10-CM | POA: Diagnosis not present

## 2020-01-17 DIAGNOSIS — I4891 Unspecified atrial fibrillation: Secondary | ICD-10-CM | POA: Diagnosis not present

## 2020-01-19 DIAGNOSIS — G8114 Spastic hemiplegia affecting left nondominant side: Secondary | ICD-10-CM | POA: Diagnosis not present

## 2020-01-19 DIAGNOSIS — R279 Unspecified lack of coordination: Secondary | ICD-10-CM | POA: Diagnosis not present

## 2020-01-19 DIAGNOSIS — M6281 Muscle weakness (generalized): Secondary | ICD-10-CM | POA: Diagnosis not present

## 2020-01-19 DIAGNOSIS — I10 Essential (primary) hypertension: Secondary | ICD-10-CM | POA: Diagnosis not present

## 2020-01-19 DIAGNOSIS — I4891 Unspecified atrial fibrillation: Secondary | ICD-10-CM | POA: Diagnosis not present

## 2020-01-25 DIAGNOSIS — I4891 Unspecified atrial fibrillation: Secondary | ICD-10-CM | POA: Diagnosis not present

## 2020-01-25 DIAGNOSIS — G8114 Spastic hemiplegia affecting left nondominant side: Secondary | ICD-10-CM | POA: Diagnosis not present

## 2020-01-25 DIAGNOSIS — R279 Unspecified lack of coordination: Secondary | ICD-10-CM | POA: Diagnosis not present

## 2020-01-25 DIAGNOSIS — M6281 Muscle weakness (generalized): Secondary | ICD-10-CM | POA: Diagnosis not present

## 2020-01-25 DIAGNOSIS — I10 Essential (primary) hypertension: Secondary | ICD-10-CM | POA: Diagnosis not present

## 2020-01-26 DIAGNOSIS — G8114 Spastic hemiplegia affecting left nondominant side: Secondary | ICD-10-CM | POA: Diagnosis not present

## 2020-01-26 DIAGNOSIS — I4891 Unspecified atrial fibrillation: Secondary | ICD-10-CM | POA: Diagnosis not present

## 2020-01-26 DIAGNOSIS — M6281 Muscle weakness (generalized): Secondary | ICD-10-CM | POA: Diagnosis not present

## 2020-01-26 DIAGNOSIS — R279 Unspecified lack of coordination: Secondary | ICD-10-CM | POA: Diagnosis not present

## 2020-01-26 DIAGNOSIS — I10 Essential (primary) hypertension: Secondary | ICD-10-CM | POA: Diagnosis not present

## 2020-01-31 DIAGNOSIS — M6281 Muscle weakness (generalized): Secondary | ICD-10-CM | POA: Diagnosis not present

## 2020-01-31 DIAGNOSIS — I4891 Unspecified atrial fibrillation: Secondary | ICD-10-CM | POA: Diagnosis not present

## 2020-01-31 DIAGNOSIS — R279 Unspecified lack of coordination: Secondary | ICD-10-CM | POA: Diagnosis not present

## 2020-01-31 DIAGNOSIS — G8114 Spastic hemiplegia affecting left nondominant side: Secondary | ICD-10-CM | POA: Diagnosis not present

## 2020-01-31 DIAGNOSIS — I10 Essential (primary) hypertension: Secondary | ICD-10-CM | POA: Diagnosis not present

## 2020-02-01 DIAGNOSIS — N3281 Overactive bladder: Secondary | ICD-10-CM | POA: Diagnosis not present

## 2020-02-01 DIAGNOSIS — K5909 Other constipation: Secondary | ICD-10-CM | POA: Diagnosis not present

## 2020-02-01 DIAGNOSIS — K219 Gastro-esophageal reflux disease without esophagitis: Secondary | ICD-10-CM | POA: Diagnosis not present

## 2020-02-01 DIAGNOSIS — I480 Paroxysmal atrial fibrillation: Secondary | ICD-10-CM | POA: Diagnosis not present

## 2020-02-01 DIAGNOSIS — I10 Essential (primary) hypertension: Secondary | ICD-10-CM | POA: Diagnosis not present

## 2020-02-01 DIAGNOSIS — E43 Unspecified severe protein-calorie malnutrition: Secondary | ICD-10-CM | POA: Diagnosis not present

## 2020-02-01 DIAGNOSIS — E782 Mixed hyperlipidemia: Secondary | ICD-10-CM | POA: Diagnosis not present

## 2020-02-01 DIAGNOSIS — R279 Unspecified lack of coordination: Secondary | ICD-10-CM | POA: Diagnosis not present

## 2020-02-01 DIAGNOSIS — I4891 Unspecified atrial fibrillation: Secondary | ICD-10-CM | POA: Diagnosis not present

## 2020-02-01 DIAGNOSIS — I69354 Hemiplegia and hemiparesis following cerebral infarction affecting left non-dominant side: Secondary | ICD-10-CM | POA: Diagnosis not present

## 2020-02-01 DIAGNOSIS — G40009 Localization-related (focal) (partial) idiopathic epilepsy and epileptic syndromes with seizures of localized onset, not intractable, without status epilepticus: Secondary | ICD-10-CM | POA: Diagnosis not present

## 2020-02-01 DIAGNOSIS — G8114 Spastic hemiplegia affecting left nondominant side: Secondary | ICD-10-CM | POA: Diagnosis not present

## 2020-02-01 DIAGNOSIS — E1165 Type 2 diabetes mellitus with hyperglycemia: Secondary | ICD-10-CM | POA: Diagnosis not present

## 2020-02-01 DIAGNOSIS — M24522 Contracture, left elbow: Secondary | ICD-10-CM | POA: Diagnosis not present

## 2020-02-01 DIAGNOSIS — M6281 Muscle weakness (generalized): Secondary | ICD-10-CM | POA: Diagnosis not present

## 2020-02-01 DIAGNOSIS — R110 Nausea: Secondary | ICD-10-CM | POA: Diagnosis not present

## 2020-02-02 DIAGNOSIS — I10 Essential (primary) hypertension: Secondary | ICD-10-CM | POA: Diagnosis not present

## 2020-02-02 DIAGNOSIS — G8114 Spastic hemiplegia affecting left nondominant side: Secondary | ICD-10-CM | POA: Diagnosis not present

## 2020-02-02 DIAGNOSIS — R279 Unspecified lack of coordination: Secondary | ICD-10-CM | POA: Diagnosis not present

## 2020-02-02 DIAGNOSIS — M6281 Muscle weakness (generalized): Secondary | ICD-10-CM | POA: Diagnosis not present

## 2020-02-02 DIAGNOSIS — I4891 Unspecified atrial fibrillation: Secondary | ICD-10-CM | POA: Diagnosis not present

## 2020-02-09 DIAGNOSIS — I4891 Unspecified atrial fibrillation: Secondary | ICD-10-CM | POA: Diagnosis not present

## 2020-02-09 DIAGNOSIS — R279 Unspecified lack of coordination: Secondary | ICD-10-CM | POA: Diagnosis not present

## 2020-02-09 DIAGNOSIS — G8114 Spastic hemiplegia affecting left nondominant side: Secondary | ICD-10-CM | POA: Diagnosis not present

## 2020-02-09 DIAGNOSIS — M6281 Muscle weakness (generalized): Secondary | ICD-10-CM | POA: Diagnosis not present

## 2020-02-09 DIAGNOSIS — I10 Essential (primary) hypertension: Secondary | ICD-10-CM | POA: Diagnosis not present

## 2020-02-13 DIAGNOSIS — I4891 Unspecified atrial fibrillation: Secondary | ICD-10-CM | POA: Diagnosis not present

## 2020-02-13 DIAGNOSIS — I10 Essential (primary) hypertension: Secondary | ICD-10-CM | POA: Diagnosis not present

## 2020-02-13 DIAGNOSIS — M6281 Muscle weakness (generalized): Secondary | ICD-10-CM | POA: Diagnosis not present

## 2020-02-13 DIAGNOSIS — R279 Unspecified lack of coordination: Secondary | ICD-10-CM | POA: Diagnosis not present

## 2020-02-13 DIAGNOSIS — G8114 Spastic hemiplegia affecting left nondominant side: Secondary | ICD-10-CM | POA: Diagnosis not present

## 2020-02-15 DIAGNOSIS — I4891 Unspecified atrial fibrillation: Secondary | ICD-10-CM | POA: Diagnosis not present

## 2020-02-15 DIAGNOSIS — G8114 Spastic hemiplegia affecting left nondominant side: Secondary | ICD-10-CM | POA: Diagnosis not present

## 2020-02-15 DIAGNOSIS — I10 Essential (primary) hypertension: Secondary | ICD-10-CM | POA: Diagnosis not present

## 2020-02-15 DIAGNOSIS — M6281 Muscle weakness (generalized): Secondary | ICD-10-CM | POA: Diagnosis not present

## 2020-02-15 DIAGNOSIS — R279 Unspecified lack of coordination: Secondary | ICD-10-CM | POA: Diagnosis not present

## 2020-02-16 DIAGNOSIS — R279 Unspecified lack of coordination: Secondary | ICD-10-CM | POA: Diagnosis not present

## 2020-02-16 DIAGNOSIS — I4891 Unspecified atrial fibrillation: Secondary | ICD-10-CM | POA: Diagnosis not present

## 2020-02-16 DIAGNOSIS — G8114 Spastic hemiplegia affecting left nondominant side: Secondary | ICD-10-CM | POA: Diagnosis not present

## 2020-02-16 DIAGNOSIS — I10 Essential (primary) hypertension: Secondary | ICD-10-CM | POA: Diagnosis not present

## 2020-02-16 DIAGNOSIS — M6281 Muscle weakness (generalized): Secondary | ICD-10-CM | POA: Diagnosis not present

## 2020-02-21 DIAGNOSIS — R279 Unspecified lack of coordination: Secondary | ICD-10-CM | POA: Diagnosis not present

## 2020-02-21 DIAGNOSIS — I10 Essential (primary) hypertension: Secondary | ICD-10-CM | POA: Diagnosis not present

## 2020-02-21 DIAGNOSIS — G8114 Spastic hemiplegia affecting left nondominant side: Secondary | ICD-10-CM | POA: Diagnosis not present

## 2020-02-21 DIAGNOSIS — M6281 Muscle weakness (generalized): Secondary | ICD-10-CM | POA: Diagnosis not present

## 2020-02-21 DIAGNOSIS — I4891 Unspecified atrial fibrillation: Secondary | ICD-10-CM | POA: Diagnosis not present

## 2020-02-22 DIAGNOSIS — Z6826 Body mass index (BMI) 26.0-26.9, adult: Secondary | ICD-10-CM | POA: Diagnosis not present

## 2020-02-22 DIAGNOSIS — R1312 Dysphagia, oropharyngeal phase: Secondary | ICD-10-CM | POA: Diagnosis not present

## 2020-02-22 DIAGNOSIS — E782 Mixed hyperlipidemia: Secondary | ICD-10-CM | POA: Diagnosis not present

## 2020-02-22 DIAGNOSIS — N390 Urinary tract infection, site not specified: Secondary | ICD-10-CM | POA: Diagnosis not present

## 2020-02-22 DIAGNOSIS — I10 Essential (primary) hypertension: Secondary | ICD-10-CM | POA: Diagnosis not present

## 2020-02-22 DIAGNOSIS — R279 Unspecified lack of coordination: Secondary | ICD-10-CM | POA: Diagnosis not present

## 2020-02-22 DIAGNOSIS — I4891 Unspecified atrial fibrillation: Secondary | ICD-10-CM | POA: Diagnosis not present

## 2020-02-22 DIAGNOSIS — I480 Paroxysmal atrial fibrillation: Secondary | ICD-10-CM | POA: Diagnosis not present

## 2020-02-22 DIAGNOSIS — G8114 Spastic hemiplegia affecting left nondominant side: Secondary | ICD-10-CM | POA: Diagnosis not present

## 2020-02-22 DIAGNOSIS — M6281 Muscle weakness (generalized): Secondary | ICD-10-CM | POA: Diagnosis not present

## 2020-02-22 DIAGNOSIS — R443 Hallucinations, unspecified: Secondary | ICD-10-CM | POA: Diagnosis not present

## 2020-02-22 DIAGNOSIS — E43 Unspecified severe protein-calorie malnutrition: Secondary | ICD-10-CM | POA: Diagnosis not present

## 2020-02-22 DIAGNOSIS — K219 Gastro-esophageal reflux disease without esophagitis: Secondary | ICD-10-CM | POA: Diagnosis not present

## 2020-02-22 DIAGNOSIS — D72819 Decreased white blood cell count, unspecified: Secondary | ICD-10-CM | POA: Diagnosis not present

## 2020-02-22 DIAGNOSIS — E119 Type 2 diabetes mellitus without complications: Secondary | ICD-10-CM | POA: Diagnosis not present

## 2020-02-22 DIAGNOSIS — I69391 Dysphagia following cerebral infarction: Secondary | ICD-10-CM | POA: Diagnosis not present

## 2020-02-23 DIAGNOSIS — G8114 Spastic hemiplegia affecting left nondominant side: Secondary | ICD-10-CM | POA: Diagnosis not present

## 2020-02-23 DIAGNOSIS — I482 Chronic atrial fibrillation, unspecified: Secondary | ICD-10-CM | POA: Diagnosis not present

## 2020-02-23 DIAGNOSIS — I4891 Unspecified atrial fibrillation: Secondary | ICD-10-CM | POA: Diagnosis not present

## 2020-02-23 DIAGNOSIS — R279 Unspecified lack of coordination: Secondary | ICD-10-CM | POA: Diagnosis not present

## 2020-02-23 DIAGNOSIS — M6281 Muscle weakness (generalized): Secondary | ICD-10-CM | POA: Diagnosis not present

## 2020-02-23 DIAGNOSIS — G8112 Spastic hemiplegia affecting left dominant side: Secondary | ICD-10-CM | POA: Diagnosis not present

## 2020-02-23 DIAGNOSIS — I10 Essential (primary) hypertension: Secondary | ICD-10-CM | POA: Diagnosis not present

## 2020-02-23 DIAGNOSIS — R269 Unspecified abnormalities of gait and mobility: Secondary | ICD-10-CM | POA: Diagnosis not present

## 2020-02-24 ENCOUNTER — Ambulatory Visit: Payer: Medicare Other | Admitting: Cardiology

## 2020-02-28 DIAGNOSIS — I4891 Unspecified atrial fibrillation: Secondary | ICD-10-CM | POA: Diagnosis not present

## 2020-02-28 DIAGNOSIS — R269 Unspecified abnormalities of gait and mobility: Secondary | ICD-10-CM | POA: Diagnosis not present

## 2020-02-28 DIAGNOSIS — I10 Essential (primary) hypertension: Secondary | ICD-10-CM | POA: Diagnosis not present

## 2020-02-28 DIAGNOSIS — I482 Chronic atrial fibrillation, unspecified: Secondary | ICD-10-CM | POA: Diagnosis not present

## 2020-02-28 DIAGNOSIS — G8112 Spastic hemiplegia affecting left dominant side: Secondary | ICD-10-CM | POA: Diagnosis not present

## 2020-02-28 DIAGNOSIS — R279 Unspecified lack of coordination: Secondary | ICD-10-CM | POA: Diagnosis not present

## 2020-02-28 DIAGNOSIS — M6281 Muscle weakness (generalized): Secondary | ICD-10-CM | POA: Diagnosis not present

## 2020-02-28 DIAGNOSIS — G8114 Spastic hemiplegia affecting left nondominant side: Secondary | ICD-10-CM | POA: Diagnosis not present

## 2020-02-29 DIAGNOSIS — G8114 Spastic hemiplegia affecting left nondominant side: Secondary | ICD-10-CM | POA: Diagnosis not present

## 2020-02-29 DIAGNOSIS — R279 Unspecified lack of coordination: Secondary | ICD-10-CM | POA: Diagnosis not present

## 2020-02-29 DIAGNOSIS — I4891 Unspecified atrial fibrillation: Secondary | ICD-10-CM | POA: Diagnosis not present

## 2020-02-29 DIAGNOSIS — I10 Essential (primary) hypertension: Secondary | ICD-10-CM | POA: Diagnosis not present

## 2020-02-29 DIAGNOSIS — M6281 Muscle weakness (generalized): Secondary | ICD-10-CM | POA: Diagnosis not present

## 2020-03-06 DIAGNOSIS — R269 Unspecified abnormalities of gait and mobility: Secondary | ICD-10-CM | POA: Diagnosis not present

## 2020-03-06 DIAGNOSIS — G8114 Spastic hemiplegia affecting left nondominant side: Secondary | ICD-10-CM | POA: Diagnosis not present

## 2020-03-06 DIAGNOSIS — M6281 Muscle weakness (generalized): Secondary | ICD-10-CM | POA: Diagnosis not present

## 2020-03-06 DIAGNOSIS — I482 Chronic atrial fibrillation, unspecified: Secondary | ICD-10-CM | POA: Diagnosis not present

## 2020-03-06 DIAGNOSIS — I4891 Unspecified atrial fibrillation: Secondary | ICD-10-CM | POA: Diagnosis not present

## 2020-03-06 DIAGNOSIS — I10 Essential (primary) hypertension: Secondary | ICD-10-CM | POA: Diagnosis not present

## 2020-03-06 DIAGNOSIS — R279 Unspecified lack of coordination: Secondary | ICD-10-CM | POA: Diagnosis not present

## 2020-03-06 DIAGNOSIS — G8112 Spastic hemiplegia affecting left dominant side: Secondary | ICD-10-CM | POA: Diagnosis not present

## 2020-03-08 DIAGNOSIS — M6281 Muscle weakness (generalized): Secondary | ICD-10-CM | POA: Diagnosis not present

## 2020-03-08 DIAGNOSIS — I4891 Unspecified atrial fibrillation: Secondary | ICD-10-CM | POA: Diagnosis not present

## 2020-03-08 DIAGNOSIS — R279 Unspecified lack of coordination: Secondary | ICD-10-CM | POA: Diagnosis not present

## 2020-03-08 DIAGNOSIS — I10 Essential (primary) hypertension: Secondary | ICD-10-CM | POA: Diagnosis not present

## 2020-03-08 DIAGNOSIS — G8114 Spastic hemiplegia affecting left nondominant side: Secondary | ICD-10-CM | POA: Diagnosis not present

## 2020-03-22 ENCOUNTER — Ambulatory Visit: Payer: Medicare Other

## 2020-04-12 DIAGNOSIS — Z23 Encounter for immunization: Secondary | ICD-10-CM | POA: Diagnosis not present

## 2020-04-14 DIAGNOSIS — R4182 Altered mental status, unspecified: Secondary | ICD-10-CM | POA: Diagnosis not present

## 2020-04-14 DIAGNOSIS — R1319 Other dysphagia: Secondary | ICD-10-CM | POA: Diagnosis not present

## 2020-04-14 DIAGNOSIS — I10 Essential (primary) hypertension: Secondary | ICD-10-CM | POA: Diagnosis not present

## 2020-04-14 DIAGNOSIS — E43 Unspecified severe protein-calorie malnutrition: Secondary | ICD-10-CM | POA: Diagnosis not present

## 2020-04-14 DIAGNOSIS — K219 Gastro-esophageal reflux disease without esophagitis: Secondary | ICD-10-CM | POA: Diagnosis not present

## 2020-04-14 DIAGNOSIS — R944 Abnormal results of kidney function studies: Secondary | ICD-10-CM | POA: Diagnosis not present

## 2020-04-14 DIAGNOSIS — I69354 Hemiplegia and hemiparesis following cerebral infarction affecting left non-dominant side: Secondary | ICD-10-CM | POA: Diagnosis not present

## 2020-04-14 DIAGNOSIS — E876 Hypokalemia: Secondary | ICD-10-CM | POA: Diagnosis not present

## 2020-04-14 DIAGNOSIS — E785 Hyperlipidemia, unspecified: Secondary | ICD-10-CM | POA: Diagnosis not present

## 2020-04-14 DIAGNOSIS — E1165 Type 2 diabetes mellitus with hyperglycemia: Secondary | ICD-10-CM | POA: Diagnosis not present

## 2020-05-03 DIAGNOSIS — G8112 Spastic hemiplegia affecting left dominant side: Secondary | ICD-10-CM | POA: Diagnosis not present

## 2020-05-03 DIAGNOSIS — I482 Chronic atrial fibrillation, unspecified: Secondary | ICD-10-CM | POA: Diagnosis not present

## 2020-05-03 DIAGNOSIS — I10 Essential (primary) hypertension: Secondary | ICD-10-CM | POA: Diagnosis not present

## 2020-05-08 DIAGNOSIS — I10 Essential (primary) hypertension: Secondary | ICD-10-CM | POA: Diagnosis not present

## 2020-05-08 DIAGNOSIS — I482 Chronic atrial fibrillation, unspecified: Secondary | ICD-10-CM | POA: Diagnosis not present

## 2020-05-08 DIAGNOSIS — G8112 Spastic hemiplegia affecting left dominant side: Secondary | ICD-10-CM | POA: Diagnosis not present

## 2020-05-10 DIAGNOSIS — G8112 Spastic hemiplegia affecting left dominant side: Secondary | ICD-10-CM | POA: Diagnosis not present

## 2020-05-10 DIAGNOSIS — R350 Frequency of micturition: Secondary | ICD-10-CM | POA: Diagnosis not present

## 2020-05-10 DIAGNOSIS — I10 Essential (primary) hypertension: Secondary | ICD-10-CM | POA: Diagnosis not present

## 2020-05-10 DIAGNOSIS — I482 Chronic atrial fibrillation, unspecified: Secondary | ICD-10-CM | POA: Diagnosis not present

## 2020-05-10 DIAGNOSIS — R001 Bradycardia, unspecified: Secondary | ICD-10-CM | POA: Diagnosis not present

## 2020-05-14 DIAGNOSIS — E876 Hypokalemia: Secondary | ICD-10-CM | POA: Diagnosis not present

## 2020-05-14 DIAGNOSIS — E1165 Type 2 diabetes mellitus with hyperglycemia: Secondary | ICD-10-CM | POA: Diagnosis not present

## 2020-05-14 DIAGNOSIS — E43 Unspecified severe protein-calorie malnutrition: Secondary | ICD-10-CM | POA: Diagnosis not present

## 2020-05-14 DIAGNOSIS — R1319 Other dysphagia: Secondary | ICD-10-CM | POA: Diagnosis not present

## 2020-05-14 DIAGNOSIS — I10 Essential (primary) hypertension: Secondary | ICD-10-CM | POA: Diagnosis not present

## 2020-05-14 DIAGNOSIS — E785 Hyperlipidemia, unspecified: Secondary | ICD-10-CM | POA: Diagnosis not present

## 2020-05-14 DIAGNOSIS — R4182 Altered mental status, unspecified: Secondary | ICD-10-CM | POA: Diagnosis not present

## 2020-05-14 DIAGNOSIS — R944 Abnormal results of kidney function studies: Secondary | ICD-10-CM | POA: Diagnosis not present

## 2020-05-14 DIAGNOSIS — K219 Gastro-esophageal reflux disease without esophagitis: Secondary | ICD-10-CM | POA: Diagnosis not present

## 2020-05-14 DIAGNOSIS — I69354 Hemiplegia and hemiparesis following cerebral infarction affecting left non-dominant side: Secondary | ICD-10-CM | POA: Diagnosis not present

## 2020-05-15 DIAGNOSIS — I10 Essential (primary) hypertension: Secondary | ICD-10-CM | POA: Diagnosis not present

## 2020-05-15 DIAGNOSIS — I482 Chronic atrial fibrillation, unspecified: Secondary | ICD-10-CM | POA: Diagnosis not present

## 2020-05-15 DIAGNOSIS — G8112 Spastic hemiplegia affecting left dominant side: Secondary | ICD-10-CM | POA: Diagnosis not present

## 2020-05-16 NOTE — Progress Notes (Signed)
Cardiology Office Note   Date:  05/17/2020   ID:  Rachel, Fox Jun 23, 1945, MRN [id number]  PCP:  Benita Stabile, MD  Cardiologist:  Dr. Wyline Mood    Chief Complaint  Patient presents with  . Shortness of Breath      History of Present Illness: Rachel Fox is a 75 y.o. female who presents for atrial fib on anticoag and dilt and amio in past also dig but this was stopped. Hx CVA 09/2018, HTN, HLD on statin   On visit 09/08/19 HR was low so dilt decreased to 120 mg daily.  She has been having SOB as well.   Echo 2020 with EF >65% atrial fib.  carotid dopplers with mild plaque. Last checked 2020  Today she presents with complaints of throat discomfort with walking short distance from living room to BR.  With rest it resolves.  Also with increased SOB with the activity.  She has no trouble sleeping. When she goes to therapy her HR is slow as well.  No edema. Today HR 41.  No syncope.    Past Medical History:  Diagnosis Date  . Atrial fibrillation (HCC)   . CVA (cerebral vascular accident) (HCC)   . High cholesterol   . Hypertension   . Type 2 diabetes mellitus (HCC)     Past Surgical History:  Procedure Laterality Date  . ABDOMINAL HYSTERECTOMY    . TUBAL LIGATION       Current Outpatient Medications  Medication Sig Dispense Refill  . acetaminophen (TYLENOL) 325 MG tablet Take 2 tablets (650 mg total) by mouth every 4 (four) hours as needed for mild pain (or temp > 37.5 C (99.5 F)).    Marland Kitchen apixaban (ELIQUIS) 5 MG TABS tablet Take 1 tablet (5 mg total) by mouth 2 (two) times daily. 60 tablet 11  . atorvastatin (LIPITOR) 80 MG tablet Take 1 tablet (80 mg total) by mouth daily at 6 PM. 30 tablet 1  . DULoxetine (CYMBALTA) 30 MG capsule Take 30 mg by mouth daily.    . metoprolol-hydrochlorothiazide (LOPRESSOR HCT) 50-25 MG tablet Take 1 tablet by mouth every morning.    . polyethylene glycol (MIRALAX / GLYCOLAX) 17 g packet Take 17 g by mouth daily.     No current  facility-administered medications for this visit.    Allergies:   Lactose intolerance (gi)    Social History:  The patient  reports that she quit smoking about 32 years ago. She has never used smokeless tobacco. She reports that she does not drink alcohol and does not use drugs.   Family History:  The patient's family history includes Heart attack in her father and sister; Stroke in her mother and sister.    ROS:  General:no colds or fevers, no weight changes Skin:no rashes or ulcers HEENT:no blurred vision, no congestion CV:see HPI PUL:see HPI GI:no diarrhea constipation or melena, no indigestion GU:no hematuria, no dysuria MS:no joint pain, no claudication Neuro:no syncope, no lightheadedness Endo:+ diabetes, no thyroid disease  Wt Readings from Last 3 Encounters:  05/17/20 123 lb (55.8 kg)  09/10/19 120 lb (54.4 kg)  03/31/19 117 lb (53.1 kg)     PHYSICAL EXAM: VS:  BP (!) 116/58   Pulse (!) 41   Ht 5\' 3"  (1.6 m)   Wt 123 lb (55.8 kg)   SpO2 100%   BMI 21.79 kg/m  , BMI Body mass index is 21.79 kg/m. General:Pleasant affect, NAD Skin:Warm and dry, brisk capillary refill  HEENT:normocephalic, sclera clear, mucus membranes moist Neck:supple, no JVD, no bruits  Heart:S1S2 RRR though slow without murmur, gallup, rub or click Lungs:clear without rales, rhonchi, or wheezes JTT:SVXB, non tender, + BS, do not palpate liver spleen or masses Ext:no lower ext edema, 2+ pedal pulses, 2+ radial pulses Neuro:alert and oriented X, 3 MAE, follows commands, + facial symmetry    EKG:  EKG is ordered today. The ekg ordered today demonstrates junctional brady at 42 with nonspecific ST and T wave aabnromality.  P waves appera in T wave.    Recent Labs: No results found for requested labs within last 8760 hours.    Lipid Panel    Component Value Date/Time   CHOL 184 09/21/2018 1120   TRIG 107 09/21/2018 1120   HDL 42 09/21/2018 1120   CHOLHDL 4.4 09/21/2018 1120   VLDL 21  09/21/2018 1120   LDLCALC 121 (H) 09/21/2018 1120       Other studies Reviewed: Additional studies/ records that were reviewed today include: . Echo 09/21/2018 IMPRESSIONS    1. The left ventricle has hyperdynamic systolic function, with an  ejection fraction of >65%. The cavity size was normal. There is mildly  increased left ventricular wall thickness. Left ventricular diastolic  function could not be evaluated secondary to  atrial fibrillation.  2. The right ventricle has normal systolic function. The cavity was  normal. There is no increase in right ventricular wall thickness.  3. Trivial pericardial effusion is present.  4. No evidence of mitral valve stenosis.  5. No stenosis of the aortic valve.  6. The interatrial septum was not assessed.   FINDINGS  Left Ventricle: The left ventricle has hyperdynamic systolic function,  with an ejection fraction of >65%. The cavity size was normal. There is  mildly increased left ventricular wall thickness. Left ventricular  diastolic function could not be evaluated  secondary to atrial fibrillation.   Right Ventricle: The right ventricle has normal systolic function. The  cavity was normal. There is no increase in right ventricular wall  thickness.   Left Atrium: Left atrial size was normal in size.   Right Atrium: Right atrial size was normal in size. Right atrial pressure  is estimated at 10 mmHg.   Interatrial Septum: The interatrial septum was not assessed.   Pericardium: Trivial pericardial effusion is present.   Mitral Valve: The mitral valve is normal in structure. Mitral valve  regurgitation is not visualized by color flow Doppler. No evidence of  mitral valve stenosis.   Tricuspid Valve: The tricuspid valve is normal in structure. Tricuspid  valve regurgitation is trivial by color flow Doppler.   Aortic Valve: The aortic valve is normal in structure. Aortic valve  regurgitation was not visualized by color  flow Doppler. There is No  stenosis of the aortic valve, with a calculated valve area of 2.32 cm.   Pulmonic Valve: The pulmonic valve was grossly normal. Pulmonic valve  regurgitation is trivial by color flow Doppler.      ASSESSMENT AND PLAN:  1.  Junctional rhythm - she has had slowing HR for last few visits with decrease in her diltiazem. HT 41.  Will stop diltiazem and decrease amiodarone to 100 mg daily.  Will check TSH, Free T4, apply zio patch. For 2 weeks.  Order echo. Follow up in 1-2 weeks.  She understands to go to ER or call or both for lightheadedness, syncope.  The SOB and throat discomfort may be related to her bradycardia if this  continues despite improved HR would do nuc study  Old notes reviewed and labs  2.  Tachy brady syndrome with hx PAF. On amiodarone will decrease dose.  Briefly discussed  pacemaker though currently not needed, first step to decrease meds.     3.  Anticoagulation on eliquis. Check CBC  4. Hx CVA, uses arm sling and brace on Lt leg.   5. hTN controlled monitor with stopping diltiazem.  6.  DOE/SOB/throat discomfort could be anginal equivalent.   6.  HLD continue statin, followed by PCP   Current medicines are reviewed with the patient today.  The patient Has no concerns regarding medicines.  The following changes have been made:  See above Labs/ tests ordered today include:see above  Disposition:   FU:  see above  Signed, Nada Boozer, NP  05/17/2020 3:01 PM    [facility] [address] [place], [place], [place]  [address]/ [address] [place], [place] Phone: [phone]; Fax: [phone]  [phone]

## 2020-05-17 ENCOUNTER — Other Ambulatory Visit: Payer: Self-pay | Admitting: Cardiology

## 2020-05-17 ENCOUNTER — Ambulatory Visit (INDEPENDENT_AMBULATORY_CARE_PROVIDER_SITE_OTHER): Payer: Medicare Other

## 2020-05-17 ENCOUNTER — Other Ambulatory Visit: Payer: Self-pay

## 2020-05-17 ENCOUNTER — Ambulatory Visit (INDEPENDENT_AMBULATORY_CARE_PROVIDER_SITE_OTHER): Payer: Medicare Other | Admitting: Cardiology

## 2020-05-17 ENCOUNTER — Other Ambulatory Visit (HOSPITAL_COMMUNITY)
Admission: RE | Admit: 2020-05-17 | Discharge: 2020-05-17 | Disposition: A | Discharge status: Home / Self Care | Payer: Medicare Other | Source: Ambulatory Visit | Attending: Cardiology | Admitting: Cardiology

## 2020-05-17 ENCOUNTER — Encounter: Payer: Self-pay | Admitting: Cardiology

## 2020-05-17 VITALS — BP 116/58 | HR 41 | Ht 63.0 in | Wt 123.0 lb

## 2020-05-17 DIAGNOSIS — R001 Bradycardia, unspecified: Secondary | ICD-10-CM | POA: Diagnosis not present

## 2020-05-17 DIAGNOSIS — I498 Other specified cardiac arrhythmias: Secondary | ICD-10-CM

## 2020-05-17 DIAGNOSIS — Z7901 Long term (current) use of anticoagulants: Secondary | ICD-10-CM

## 2020-05-17 DIAGNOSIS — R0602 Shortness of breath: Secondary | ICD-10-CM

## 2020-05-17 DIAGNOSIS — I10 Essential (primary) hypertension: Secondary | ICD-10-CM

## 2020-05-17 DIAGNOSIS — I639 Cerebral infarction, unspecified: Secondary | ICD-10-CM | POA: Diagnosis not present

## 2020-05-17 DIAGNOSIS — G8112 Spastic hemiplegia affecting left dominant side: Secondary | ICD-10-CM | POA: Diagnosis not present

## 2020-05-17 DIAGNOSIS — E785 Hyperlipidemia, unspecified: Secondary | ICD-10-CM | POA: Diagnosis not present

## 2020-05-17 DIAGNOSIS — I482 Chronic atrial fibrillation, unspecified: Secondary | ICD-10-CM | POA: Diagnosis not present

## 2020-05-17 DIAGNOSIS — I480 Paroxysmal atrial fibrillation: Secondary | ICD-10-CM | POA: Diagnosis not present

## 2020-05-17 LAB — CBC
HCT: 42.3 % (ref 36.0–46.0)
Hemoglobin: 13.2 g/dL (ref 12.0–15.0)
MCH: 28.3 pg (ref 26.0–34.0)
MCHC: 31.2 g/dL (ref 30.0–36.0)
MCV: 90.6 fL (ref 80.0–100.0)
Platelets: 156 10*3/uL (ref 150–400)
RBC: 4.67 MIL/uL (ref 3.87–5.11)
RDW: 15.7 % — ABNORMAL HIGH (ref 11.5–15.5)
WBC: 7.3 10*3/uL (ref 4.0–10.5)
nRBC: 0 % (ref 0.0–0.2)

## 2020-05-17 LAB — COMPREHENSIVE METABOLIC PANEL
ALT: 30 U/L (ref 0–44)
AST: 29 U/L (ref 15–41)
Albumin: 3.6 g/dL (ref 3.5–5.0)
Alkaline Phosphatase: 61 U/L (ref 38–126)
Anion gap: 9 (ref 5–15)
BUN: 20 mg/dL (ref 8–23)
CO2: 29 mmol/L (ref 22–32)
Calcium: 9.5 mg/dL (ref 8.9–10.3)
Chloride: 103 mmol/L (ref 98–111)
Creatinine, Ser: 0.92 mg/dL (ref 0.44–1.00)
GFR, Estimated: >60 mL/min (ref >60)
Glucose, Bld: 109 mg/dL — ABNORMAL HIGH (ref 70–99)
Potassium: 3.7 mmol/L (ref 3.5–5.1)
Sodium: 141 mmol/L (ref 135–145)
Total Bilirubin: 0.6 mg/dL (ref 0.3–1.2)
Total Protein: 7.5 g/dL (ref 6.5–8.1)

## 2020-05-17 LAB — TSH: TSH: 3.503 u[IU]/mL (ref 0.350–4.500)

## 2020-05-17 LAB — T4, FREE: Free T4: 1.56 ng/dL — ABNORMAL HIGH (ref 0.61–1.12)

## 2020-05-17 MED ORDER — AMIODARONE HCL 100 MG PO TABS: 100.0000 mg | ORAL_TABLET | Freq: Every day | ORAL | 3 refills | Status: DC

## 2020-05-17 NOTE — Patient Instructions (Signed)
Medication Instructions:  STOP DILTIAZEM  DECREASE AMIODARONE to 100 mg Daily  *If you need a refill on your cardiac medications before your next appointment, please call your pharmacy*   Lab Work: TSH Free T4 CMP CBC  If you have labs (blood work) drawn today and your tests are completely normal, you will receive your results only by: Marland Kitchen MyChart Message (if you have MyChart) OR . A paper copy in the mail If you have any lab test that is abnormal or we need to change your treatment, we will call you to review the results.   Testing/Procedures: Your physician has requested that you have an echocardiogram. Echocardiography is a painless test that uses sound waves to create images of your heart. It provides your doctor with information about the size and shape of your heart and how well your heart's chambers and valves are working. This procedure takes approximately one hour. There are no restrictions for this procedure.   Echocardiogram An echocardiogram is a test that uses sound waves (ultrasound) to produce images of the heart. Images from an echocardiogram can provide important information about:  Heart size and shape.  The size and thickness and movement of your heart's walls.  Heart muscle function and strength.  Heart valve function or if you have stenosis. Stenosis is when the heart valves are too narrow.  If blood is flowing backward through the heart valves (regurgitation).  A tumor or infectious growth around the heart valves.  Areas of heart muscle that are not working well because of poor blood flow or injury from a heart attack.  Aneurysm detection. An aneurysm is a weak or damaged part of an artery wall. The wall bulges out from the normal force of blood pumping through the body. Tell a health care provider about:  Any allergies you have.  All medicines you are taking, including vitamins, herbs, eye drops, creams, and over-the-counter medicines.  Any blood  disorders you have.  Any surgeries you have had.  Any medical conditions you have.  Whether you are pregnant or may be pregnant. What are the risks? Generally, this is a safe test. However, problems may occur, including an allergic reaction to dye (contrast) that may be used during the test. What happens before the test? No specific preparation is needed. You may eat and drink normally. What happens during the test?  You will take off your clothes from the waist up and put on a hospital gown.  Electrodes or electrocardiogram (ECG)patches may be placed on your chest. The electrodes or patches are then connected to a device that monitors your heart rate and rhythm.  You will lie down on a table for an ultrasound exam. A gel will be applied to your chest to help sound waves pass through your skin.  A handheld device, called a transducer, will be pressed against your chest and moved over your heart. The transducer produces sound waves that travel to your heart and bounce back (or "echo" back) to the transducer. These sound waves will be captured in real-time and changed into images of your heart that can be viewed on a video monitor. The images will be recorded on a computer and reviewed by your health care provider.  You may be asked to change positions or hold your breath for a short time. This makes it easier to get different views or better views of your heart.  In some cases, you may receive contrast through an IV in one of your veins.  This can improve the quality of the pictures from your heart. The procedure may vary among health care providers and hospitals.   What can I expect after the test? You may return to your normal, everyday life, including diet, activities, and medicines, unless your health care provider tells you not to do that. Follow these instructions at home:  It is up to you to get the results of your test. Ask your health care provider, or the department that is doing  the test, when your results will be ready.  Keep all follow-up visits. This is important. Summary  An echocardiogram is a test that uses sound waves (ultrasound) to produce images of the heart.  Images from an echocardiogram can provide important information about the size and shape of your heart, heart muscle function, heart valve function, and other possible heart problems.  You do not need to do anything to prepare before this test. You may eat and drink normally.  After the echocardiogram is completed, you may return to your normal, everyday life, unless your health care provider tells you not to do that. This information is not intended to replace advice given to you by your health care provider. Make sure you discuss any questions you have with your health care provider. Document Revised: 10/25/2019 Document Reviewed: 10/25/2019 Elsevier Patient Education  2021 [employer]vier Inc.    Follow-Up: At [facility], you and your health needs are our priority.  As part of our continuing mission to provide you with exceptional heart care, we have created designated Provider Care Teams.  These Care Teams include your primary Cardiologist (physician) and Advanced Practice Providers (APPs -  Physician Assistants and Nurse Practitioners) who all work together to provide you with the care you need, when you need it.  We recommend signing up for the patient portal called "MyChart".  Sign up information is provided on this After Visit Summary.  MyChart is used to connect with patients for Virtual Visits (Telemedicine).  Patients are able to view lab/test results, encounter notes, upcoming appointments, etc.  Non-urgent messages can be sent to your provider as well.   To learn more about what you can do with MyChart, go to [email or web address].    Your next appointment:   2 week(s)  The format for your next appointment:   In Person  Provider:   You may see Dina Rich, MD or one of the  following Advanced Practice Providers on your designated Care Team:    Randall An, PA-C   Jacolyn Reedy, PA-C     Other Instructions 14 day Zio Patch

## 2020-05-22 DIAGNOSIS — I482 Chronic atrial fibrillation, unspecified: Secondary | ICD-10-CM | POA: Diagnosis not present

## 2020-05-22 DIAGNOSIS — I10 Essential (primary) hypertension: Secondary | ICD-10-CM | POA: Diagnosis not present

## 2020-05-22 DIAGNOSIS — G8112 Spastic hemiplegia affecting left dominant side: Secondary | ICD-10-CM | POA: Diagnosis not present

## 2020-05-29 DIAGNOSIS — G8112 Spastic hemiplegia affecting left dominant side: Secondary | ICD-10-CM | POA: Diagnosis not present

## 2020-05-29 DIAGNOSIS — I482 Chronic atrial fibrillation, unspecified: Secondary | ICD-10-CM | POA: Diagnosis not present

## 2020-05-29 DIAGNOSIS — I10 Essential (primary) hypertension: Secondary | ICD-10-CM | POA: Diagnosis not present

## 2020-06-05 ENCOUNTER — Ambulatory Visit: Payer: Medicare Other | Admitting: Student

## 2020-06-06 ENCOUNTER — Other Ambulatory Visit: Payer: Self-pay

## 2020-06-06 ENCOUNTER — Ambulatory Visit (HOSPITAL_COMMUNITY)
Admission: RE | Admit: 2020-06-06 | Discharge: 2020-06-06 | Disposition: A | Discharge status: Home / Self Care | Payer: Medicare Other | Source: Ambulatory Visit | Attending: Cardiology | Admitting: Cardiology

## 2020-06-06 DIAGNOSIS — R0602 Shortness of breath: Secondary | ICD-10-CM | POA: Diagnosis not present

## 2020-06-06 DIAGNOSIS — R001 Bradycardia, unspecified: Secondary | ICD-10-CM | POA: Diagnosis not present

## 2020-06-06 LAB — ECHOCARDIOGRAM COMPLETE
AR max vel: 1.98 cm2
AV Area VTI: 2.12 cm2
AV Area mean vel: 1.97 cm2
AV Mean grad: 2.4 mmHg
AV Peak grad: 5.6 mmHg
Ao pk vel: 1.18 m/s
Area-P 1/2: 4.33 cm2
S' Lateral: 3.1 cm

## 2020-06-06 NOTE — Progress Notes (Signed)
*  PRELIMINARY RESULTS* Echocardiogram 2D Echocardiogram has been performed.  Jeryl Columbia 06/06/2020, 2:43 PM

## 2020-06-07 DIAGNOSIS — I482 Chronic atrial fibrillation, unspecified: Secondary | ICD-10-CM | POA: Diagnosis not present

## 2020-06-07 DIAGNOSIS — G8112 Spastic hemiplegia affecting left dominant side: Secondary | ICD-10-CM | POA: Diagnosis not present

## 2020-06-07 DIAGNOSIS — I10 Essential (primary) hypertension: Secondary | ICD-10-CM | POA: Diagnosis not present

## 2020-06-12 DIAGNOSIS — I10 Essential (primary) hypertension: Secondary | ICD-10-CM | POA: Diagnosis not present

## 2020-06-12 DIAGNOSIS — G8112 Spastic hemiplegia affecting left dominant side: Secondary | ICD-10-CM | POA: Diagnosis not present

## 2020-06-12 DIAGNOSIS — I482 Chronic atrial fibrillation, unspecified: Secondary | ICD-10-CM | POA: Diagnosis not present

## 2020-06-13 DIAGNOSIS — I69354 Hemiplegia and hemiparesis following cerebral infarction affecting left non-dominant side: Secondary | ICD-10-CM | POA: Diagnosis not present

## 2020-06-13 DIAGNOSIS — K219 Gastro-esophageal reflux disease without esophagitis: Secondary | ICD-10-CM | POA: Diagnosis not present

## 2020-06-13 DIAGNOSIS — E43 Unspecified severe protein-calorie malnutrition: Secondary | ICD-10-CM | POA: Diagnosis not present

## 2020-06-13 DIAGNOSIS — R4182 Altered mental status, unspecified: Secondary | ICD-10-CM | POA: Diagnosis not present

## 2020-06-13 DIAGNOSIS — E785 Hyperlipidemia, unspecified: Secondary | ICD-10-CM | POA: Diagnosis not present

## 2020-06-13 DIAGNOSIS — R1319 Other dysphagia: Secondary | ICD-10-CM | POA: Diagnosis not present

## 2020-06-13 DIAGNOSIS — R944 Abnormal results of kidney function studies: Secondary | ICD-10-CM | POA: Diagnosis not present

## 2020-06-13 DIAGNOSIS — E876 Hypokalemia: Secondary | ICD-10-CM | POA: Diagnosis not present

## 2020-06-13 DIAGNOSIS — I10 Essential (primary) hypertension: Secondary | ICD-10-CM | POA: Diagnosis not present

## 2020-06-13 DIAGNOSIS — E1165 Type 2 diabetes mellitus with hyperglycemia: Secondary | ICD-10-CM | POA: Diagnosis not present

## 2020-06-14 ENCOUNTER — Ambulatory Visit (INDEPENDENT_AMBULATORY_CARE_PROVIDER_SITE_OTHER): Payer: Medicare Other | Admitting: Student

## 2020-06-14 ENCOUNTER — Encounter: Payer: Self-pay | Admitting: Student

## 2020-06-14 ENCOUNTER — Other Ambulatory Visit: Payer: Self-pay

## 2020-06-14 VITALS — BP 148/78 | HR 52 | Ht 63.0 in | Wt 122.6 lb

## 2020-06-14 DIAGNOSIS — I482 Chronic atrial fibrillation, unspecified: Secondary | ICD-10-CM | POA: Diagnosis not present

## 2020-06-14 DIAGNOSIS — I480 Paroxysmal atrial fibrillation: Secondary | ICD-10-CM | POA: Diagnosis not present

## 2020-06-14 DIAGNOSIS — E785 Hyperlipidemia, unspecified: Secondary | ICD-10-CM

## 2020-06-14 DIAGNOSIS — I639 Cerebral infarction, unspecified: Secondary | ICD-10-CM | POA: Diagnosis not present

## 2020-06-14 DIAGNOSIS — Z79899 Other long term (current) drug therapy: Secondary | ICD-10-CM

## 2020-06-14 DIAGNOSIS — I495 Sick sinus syndrome: Secondary | ICD-10-CM

## 2020-06-14 DIAGNOSIS — I10 Essential (primary) hypertension: Secondary | ICD-10-CM | POA: Diagnosis not present

## 2020-06-14 DIAGNOSIS — G8112 Spastic hemiplegia affecting left dominant side: Secondary | ICD-10-CM | POA: Diagnosis not present

## 2020-06-14 MED ORDER — LOSARTAN POTASSIUM 25 MG PO TABS: 25.0000 mg | ORAL_TABLET | Freq: Every day | ORAL | 3 refills | Status: DC

## 2020-06-14 NOTE — Patient Instructions (Addendum)
Medication Instructions:  Your physician has recommended you make the following change in your medication: STOP METOPROLOL-HCTZ and START LOSARTAN 25 mg daily  *If you need a refill on your cardiac medications before your next appointment, please call your pharmacy*   Lab Work: BMET- 3 weeks If you have labs (blood work) drawn today and your tests are completely normal, you will receive your results only by: Marland Kitchen MyChart Message (if you have MyChart) OR . A paper copy in the mail If you have any lab test that is abnormal or we need to change your treatment, we will call you to review the results.   Testing/Procedures: None   Follow-Up: At [facility], you and your health needs are our priority.  As part of our continuing mission to provide you with exceptional heart care, we have created designated Provider Care Teams.  These Care Teams include your primary Cardiologist (physician) and Advanced Practice Providers (APPs -  Physician Assistants and Nurse Practitioners) who all work together to provide you with the care you need, when you need it.  We recommend signing up for the patient portal called "MyChart".  Sign up information is provided on this After Visit Summary.  MyChart is used to connect with patients for Virtual Visits (Telemedicine).  Patients are able to view lab/test results, encounter notes, upcoming appointments, etc.  Non-urgent messages can be sent to your provider as well.   To learn more about what you can do with MyChart, go to [email or web address].    Your next appointment:   3 month(s)  The format for your next appointment:   In Person  Provider:   Randall An, PA-C   Other Instructions Referral to EP. They will give you a call to set up an appointment

## 2020-06-14 NOTE — Progress Notes (Signed)
Cardiology Office Note    Date:  06/14/2020   ID:  Rachel Fox, DOB 1945-04-08, MRN [id number]  PCP:  Benita StabileHall, John Z, MD  Cardiologist: Dina RichBranch, Jonathan, MD    Chief Complaint  Patient presents with  . Follow-up    To review monitor and echocardiogram results    History of Present Illness:    Rachel Fox is a 75 y.o. female with past medical history of paroxysmal atrial fibrillation, HTN, HLD and history of CVA who presents to the office today for follow-up from her recent monitor and echocardiogram.   She was recently examined by Nada BoozerLaura Ingold, NP and reported episodes of bradycardia while at therapy. Did note associated dyspnea but denied any dizziness or presyncope. EKG at the time of her visit showed junctional bradycardia with HR of 42 and Cardizem CD 120mg  daily was discontinued along with Amiodarone being reduced to 100mg  daily. A 2-week monitor along with echocardiogram were recommended for further evaluation. Her echocardiogram showed a preserved EF of 55-60% with no regional WMA. RV function was normal and she did have severe LA dilation and mild MR. Her event monitor showed predominately sinus rhythm with an average HR of 46 bpm and a 6% AF burden with average HR of 78 bpm while in AF. She did have frequent pauses with the longest being 4.7 seconds and her longest pauses occurred on 3/4, 3/6 and 3/8. Also had episodes of junctional rhythm.   In talking with the patient and her family member today, she reports that her dyspnea has improved since discontinuation of Cardizem and she has not experienced the discomfort along her throat she was previously experiencing. She still has episodic dizziness but denies any syncopal events. No recent orthopnea, PND or lower extremity edema. Says her heart rate has continued to be in the 40's to 50's when working with PT.  Past Medical History:  Diagnosis Date  . Atrial fibrillation (HCC)   . CVA (cerebral vascular accident) (HCC)   .  High cholesterol   . Hypertension   . Type 2 diabetes mellitus (HCC)     Past Surgical History:  Procedure Laterality Date  . ABDOMINAL HYSTERECTOMY    . TUBAL LIGATION      Current Medications: Outpatient Medications Prior to Visit  Medication Sig Dispense Refill  . acetaminophen (TYLENOL) 325 MG tablet Take 2 tablets (650 mg total) by mouth every 4 (four) hours as needed for mild pain (or temp > 37.5 C (99.5 F)).    Marland Kitchen. amiodarone (PACERONE) 100 MG tablet Take 1 tablet (100 mg total) by mouth daily. 90 tablet 3  . apixaban (ELIQUIS) 5 MG TABS tablet Take 1 tablet (5 mg total) by mouth 2 (two) times daily. 60 tablet 11  . atorvastatin (LIPITOR) 80 MG tablet Take 1 tablet (80 mg total) by mouth daily at 6 PM. 30 tablet 1  . DULoxetine (CYMBALTA) 30 MG capsule Take 30 mg by mouth daily.    . polyethylene glycol (MIRALAX / GLYCOLAX) 17 g packet Take 17 g by mouth daily.    . metoprolol-hydrochlorothiazide (LOPRESSOR HCT) 50-25 MG tablet Take 1 tablet by mouth every morning.     No facility-administered medications prior to visit.     Allergies:   Lactose intolerance (gi)   Social History   Socioeconomic History  . Marital status: Divorced    Spouse name: Not on file  . Number of children: Not on file  . Years of education: Not on file  .  Highest education level: Not on file  Occupational History  . Not on file  Tobacco Use  . Smoking status: Former Smoker    Quit date: 04/14/1988    Years since quitting: 32.1  . Smokeless tobacco: Never Used  Vaping Use  . Vaping Use: Never used  Substance and Sexual Activity  . Alcohol use: Never  . Drug use: Never  . Sexual activity: Not on file  Other Topics Concern  . Not on file  Social History Narrative  . Not on file   Social Determinants of Health   Financial Resource Strain: Not on file  Food Insecurity: Not on file  Transportation Needs: Not on file  Physical Activity: Not on file  Stress: Not on file  Social  Connections: Not on file     Family History:  The patient's family history includes Heart attack in her father and sister; Stroke in her mother and sister.   Review of Systems:   Please see the history of present illness.     General:  No chills, fever, night sweats or weight changes. Positive for dizziness.  Cardiovascular:  No chest pain, dyspnea on exertion, edema, orthopnea, palpitations, paroxysmal nocturnal dyspnea. Dermatological: No rash, lesions/masses Respiratory: No cough, dyspnea Urologic: No hematuria, dysuria Abdominal:   No nausea, vomiting, diarrhea, bright red blood per rectum, melena, or hematemesis Neurologic:  No visual changes, wkns, changes in mental status. All other systems reviewed and are otherwise negative except as noted above.   Physical Exam:    VS:  BP (!) 148/78   Pulse (!) 52   Ht 5\' 3"  (1.6 m)   Wt 122 lb 9.6 oz (55.6 kg)   SpO2 98%   BMI 21.72 kg/m    General: Well developed, well nourished,female appearing in no acute distress. Sitting in wheelchair.  Head: Normocephalic, atraumatic. Neck: No carotid bruits. JVD not elevated.  Lungs: Respirations regular and unlabored, without wheezes or rales.  Heart: Regular rhythm, bradycardiac rate. No S3 or S4.  No murmur, no rubs, or gallops appreciated. Abdomen: Appears non-distended. No obvious abdominal masses. Msk:  Strength and tone appear normal for age. No obvious joint deformities or effusions. Extremities: No clubbing or cyanosis. No edema.  Distal pedal pulses are 2+ bilaterally. Neuro: Alert and oriented X 3. Moves all extremities spontaneously. No focal deficits noted. Psych:  Responds to questions appropriately with a normal affect. Skin: No rashes or lesions noted  Wt Readings from Last 3 Encounters:  06/14/20 122 lb 9.6 oz (55.6 kg)  05/17/20 123 lb (55.8 kg)  09/10/19 120 lb (54.4 kg)     Studies/Labs Reviewed:   EKG:  EKG is not ordered today.   Recent Labs: 05/17/2020: ALT 30;  BUN 20; Creatinine, Ser 0.92; Hemoglobin 13.2; Platelets 156; Potassium 3.7; Sodium 141; TSH 3.503   Lipid Panel    Component Value Date/Time   CHOL 184 09/21/2018 1120   TRIG 107 09/21/2018 1120   HDL 42 09/21/2018 1120   CHOLHDL 4.4 09/21/2018 1120   VLDL 21 09/21/2018 1120   LDLCALC 121 (H) 09/21/2018 1120    Additional studies/ records that were reviewed today include:   Echocardiogram: 05/2020 IMPRESSIONS    1. Left ventricular ejection fraction, by estimation, is 55 to 60%. The  left ventricle has normal function. The left ventricle has no regional  wall motion abnormalities. Left ventricular diastolic parameters are  indeterminate.  2. Right ventricular systolic function is normal. The right ventricular  size is normal.  3. Left atrial size was severely dilated.  4. The mitral valve is normal in structure. Mild mitral valve  regurgitation. No evidence of mitral stenosis.  5. The aortic valve is tricuspid. Aortic valve regurgitation is not  visualized. No aortic stenosis is present.  6. The inferior vena cava is normal in size with greater than 50%  respiratory variability, suggesting right atrial pressure of 3 mmHg.   Event Monitor: 05/2020  14 day monitor  No patient reported symptoms  Min HR 31, Max HR 121, Avg HR 46  Rare supraventricular ectopy. Episodes of afib.  Rare ventricular ectopy  Pauses up to 4.7 seconds, typically post afib termination pauses in early AM hours.  Episodes of junctional rhythm rates in the 30s in later afternoon     Assessment:    1. PAF (paroxysmal atrial fibrillation) (HCC)   2. Tachy-brady syndrome (HCC)   3. Medication management   4. Essential hypertension   5. Hyperlipidemia LDL goal <70      Plan:   In order of problems listed above:  1. Paroxysmal Atrial Fibrillation complicated by Tachy-Brady Syndrome - Her recent monitor showed predominately SR with average HR of 46 bpm and a 6% AF burden with  average HR of 78 bpm while in AF. She did have frequent pauses with the longest being 4.7 seconds following discontinuation of Cardizem CD.  - Reviewed monitor results with Dr. Wyline Mood and will plan to stop Metoprolol while continuing on Amiodarone 100 mg daily and also refer to EP for further evaluation of tachy-brady syndrome. Given her frequent urination with HCTZ as well, will discontinue the combination pill for Metoprolol-HCTZ and switch to Losartan 25mg  daily. I did encourage her to continue to follow BP at home.  - She denies any evidence of active bleeding. Remains on Eliquis 5 mg twice daily for anticoagulation.  2. HTN - BP is at 148/78 during today's visit. Will plan to stop Metoprolol-HCTZ as outlined above and start Losartan 25mg  daily. Repeat BMET in 2 weeks after starting Losartan. I did encourage her to follow readings at home and report back as I suspect she will require further titration of Losartan.   3. HLD - Followed by PCP. She remains on Atorvastatin 80mg  daily and LDL was at 73 in 06/2019.   Medication Adjustments/Labs and Tests Ordered: Current medicines are reviewed at length with the patient today.  Concerns regarding medicines are outlined above.  Medication changes, Labs and Tests ordered today are listed in the Patient Instructions below. Patient Instructions  Medication Instructions:  Your physician has recommended you make the following change in your medication: STOP METOPROLOL-HCTZ and START LOSARTAN 25 mg daily  *If you need a refill on your cardiac medications before your next appointment, please call your pharmacy*   Lab Work: BMET- 3 weeks If you have labs (blood work) drawn today and your tests are completely normal, you will receive your results only by: MyChart Message (if you have MyChart) OR . A paper copy in the mail If you have any lab test that is abnormal or we need to change your treatment, we will call you to review the  results.   Testing/Procedures: None   Follow-Up: At [facility], you and your health needs are our priority.  As part of our continuing mission to provide you with exceptional heart care, we have created designated Provider Care Teams.  These Care Teams include your primary Cardiologist (physician) and Advanced Practice Providers (APPs -  Physician Assistants  and Nurse Practitioners) who all work together to provide you with the care you need, when you need it.  We recommend signing up for the patient portal called "MyChart".  Sign up information is provided on this After Visit Summary.  MyChart is used to connect with patients for Virtual Visits (Telemedicine).  Patients are able to view lab/test results, encounter notes, upcoming appointments, etc.  Non-urgent messages can be sent to your provider as well.   To learn more about what you can do with MyChart, go to [email or web address].    Your next appointment:   3 month(s)  The format for your next appointment:   In Person  Provider:   Randall An, PA-C   Other Instructions Referral to EP. They will give you a call to set up an appointment     Signed, Ellsworth Lennox, PA-C  06/14/2020 6:59 PM    [facility] [address] [place], [place] [address] Phone: [phone] Fax: [phone]

## 2020-06-19 DIAGNOSIS — I480 Paroxysmal atrial fibrillation: Secondary | ICD-10-CM | POA: Diagnosis not present

## 2020-06-19 DIAGNOSIS — G8114 Spastic hemiplegia affecting left nondominant side: Secondary | ICD-10-CM | POA: Diagnosis not present

## 2020-06-19 DIAGNOSIS — R001 Bradycardia, unspecified: Secondary | ICD-10-CM | POA: Diagnosis not present

## 2020-06-19 DIAGNOSIS — I10 Essential (primary) hypertension: Secondary | ICD-10-CM | POA: Diagnosis not present

## 2020-06-27 ENCOUNTER — Telehealth: Payer: Self-pay | Admitting: Student

## 2020-06-27 NOTE — Telephone Encounter (Signed)
Spoke with pt who states that she has had a headache since changing BP meds. Pt states that she has also had increased frequency of urination. Pt states that she has not started taking the new medication yet. Encouraged pt to call PCP in regards to urination. Pt asked that we send new rx to [facility] pharmacy. Please advise.

## 2020-06-27 NOTE — Telephone Encounter (Signed)
    If she already stopped Metoprolol-HCTZ, I doubt her increased urination is due to DISCONTINUATION of her diuretic. Would recommend she follow-up with her PCP to rule-out a UTI. Would start Losartan once able as elevated BP could be contributing to her headache.   Signed, Ellsworth Lennox, PA-C 06/27/2020, 4:54 PM Pager: [phone]

## 2020-06-27 NOTE — Telephone Encounter (Signed)
Patient called stating that since she has been taken off of her BP/fluid pill she is having a headache.  [phone].

## 2020-06-28 NOTE — Telephone Encounter (Signed)
Call to notify pt. No answer, left msg to call back

## 2020-06-28 NOTE — Telephone Encounter (Signed)
Called patient no answer  Left msg to call back

## 2020-06-29 NOTE — Telephone Encounter (Signed)
Spoke to patient who verbalized understanding. Patient agreed to make a PCP appointment to rule out a UTI. Patient agreed to start losartan asap.

## 2020-06-29 NOTE — Telephone Encounter (Signed)
Contacted patient with no success. Left a message to call our office back.

## 2020-07-06 DIAGNOSIS — G8112 Spastic hemiplegia affecting left dominant side: Secondary | ICD-10-CM | POA: Diagnosis not present

## 2020-07-06 DIAGNOSIS — M79622 Pain in left upper arm: Secondary | ICD-10-CM | POA: Diagnosis not present

## 2020-07-18 DIAGNOSIS — G8190 Hemiplegia, unspecified affecting unspecified side: Secondary | ICD-10-CM | POA: Diagnosis not present

## 2020-08-14 DIAGNOSIS — R350 Frequency of micturition: Secondary | ICD-10-CM | POA: Diagnosis not present

## 2020-08-14 DIAGNOSIS — E119 Type 2 diabetes mellitus without complications: Secondary | ICD-10-CM | POA: Diagnosis not present

## 2020-08-14 DIAGNOSIS — I10 Essential (primary) hypertension: Secondary | ICD-10-CM | POA: Diagnosis not present

## 2020-08-14 DIAGNOSIS — E1165 Type 2 diabetes mellitus with hyperglycemia: Secondary | ICD-10-CM | POA: Diagnosis not present

## 2020-08-14 DIAGNOSIS — E782 Mixed hyperlipidemia: Secondary | ICD-10-CM | POA: Diagnosis not present

## 2020-08-15 ENCOUNTER — Ambulatory Visit (INDEPENDENT_AMBULATORY_CARE_PROVIDER_SITE_OTHER): Payer: Medicare Other | Admitting: Internal Medicine

## 2020-08-15 ENCOUNTER — Encounter: Payer: Self-pay | Admitting: Internal Medicine

## 2020-08-15 ENCOUNTER — Other Ambulatory Visit (HOSPITAL_COMMUNITY)
Admission: RE | Admit: 2020-08-15 | Discharge: 2020-08-15 | Disposition: A | Discharge status: Home / Self Care | Payer: Medicare Other | Source: Ambulatory Visit | Attending: Internal Medicine | Admitting: Internal Medicine

## 2020-08-15 ENCOUNTER — Other Ambulatory Visit: Payer: Self-pay

## 2020-08-15 VITALS — BP 220/88 | HR 59 | Ht 63.0 in | Wt 123.4 lb

## 2020-08-15 DIAGNOSIS — I480 Paroxysmal atrial fibrillation: Secondary | ICD-10-CM

## 2020-08-15 DIAGNOSIS — I495 Sick sinus syndrome: Secondary | ICD-10-CM | POA: Diagnosis not present

## 2020-08-15 LAB — BASIC METABOLIC PANEL
Anion gap: 5 (ref 5–15)
BUN: 14 mg/dL (ref 8–23)
CO2: 29 mmol/L (ref 22–32)
Calcium: 9.4 mg/dL (ref 8.9–10.3)
Chloride: 104 mmol/L (ref 98–111)
Creatinine, Ser: 0.73 mg/dL (ref 0.44–1.00)
GFR, Estimated: >60 mL/min (ref >60)
Glucose, Bld: 87 mg/dL (ref 70–99)
Potassium: 3.7 mmol/L (ref 3.5–5.1)
Sodium: 138 mmol/L (ref 135–145)

## 2020-08-15 LAB — CBC
HCT: 41.7 % (ref 36.0–46.0)
Hemoglobin: 13 g/dL (ref 12.0–15.0)
MCH: 28.1 pg (ref 26.0–34.0)
MCHC: 31.2 g/dL (ref 30.0–36.0)
MCV: 90.3 fL (ref 80.0–100.0)
Platelets: 162 10*3/uL (ref 150–400)
RBC: 4.62 MIL/uL (ref 3.87–5.11)
RDW: 14 % (ref 11.5–15.5)
WBC: 6.5 10*3/uL (ref 4.0–10.5)
nRBC: 0 % (ref 0.0–0.2)

## 2020-08-15 NOTE — Patient Instructions (Addendum)
Medication Instructions:  Your physician recommends that you continue on your current medications as directed. Please refer to the Current Medication list given to you today.  *If you need a refill on your cardiac medications before your next appointment, please call your pharmacy*   Lab Work: None If you have labs (blood work) drawn today and your tests are completely normal, you will receive your results only by: Marland Kitchen MyChart Message (if you have MyChart) OR . A paper copy in the mail If you have any lab test that is abnormal or we need to change your treatment, we will call you to review the results.   Testing/Procedures: None   Follow-Up: At [facility], you and your health needs are our priority.  As part of our continuing mission to provide you with exceptional heart care, we have created designated Provider Care Teams.  These Care Teams include your primary Cardiologist (physician) and Advanced Practice Providers (APPs -  Physician Assistants and Nurse Practitioners) who all work together to provide you with the care you need, when you need it.  We recommend signing up for the patient portal called "MyChart".  Sign up information is provided on this After Visit Summary.  MyChart is used to connect with patients for Virtual Visits (Telemedicine).  Patients are able to view lab/test results, encounter notes, upcoming appointments, etc.  Non-urgent messages can be sent to your provider as well.   To learn more about what you can do with MyChart, go to [email or web address].    Your next appointment:   To Be Determined   Other Instructions  Please consider one of these dates and let us know:   August 22, 2020 -Wednesday September 19, 2020- Wednesday September 21, 2020- Friday    Pacemaker Implantation, Adult Pacemaker implantation is a procedure to place a pacemaker inside the chest. A pacemaker is a small computer that sends electrical signals to the heart and helps the heart beat  normally. A pacemaker also stores information about heart rhythms. You may need pacemaker implantation if you have:  A slow heartbeat (bradycardia).  Loss of consciousness that happens repeatedly (syncope) or repeated episodes of dizziness or light-headedness because of an irregular heart rate.  Shortness of breath (dyspnea) due to heart problems. The pacemaker usually attaches to your heart through a wire called a lead. One or two leads may be needed. There are different types of pacemakers:  Transvenous pacemaker. This type is placed under the skin or muscle of your upper chest area. The lead goes through a vein in the chest area to reach the inside of the heart.  Epicardial pacemaker. This type is placed under the skin or muscle of your chest or abdomen. The lead goes through your chest to the outside of the heart. Tell a health care provider about:  Any allergies you have.  All medicines you are taking, including vitamins, herbs, eye drops, creams, and over-the-counter medicines.  Any problems you or family members have had with anesthetic medicines.  Any blood or bone disorders you have.  Any surgeries you have had.  Any medical conditions you have.  Whether you are pregnant or may be pregnant. What are the risks? Generally, this is a safe procedure. However, problems may occur, including:  Infection.  Bleeding.  Failure of the pacemaker or the lead.  Collapse of a lung or bleeding into a lung.  Blood clot inside a blood vessel with a lead.  Damage to the heart.  Infection inside the heart (  endocarditis).  Allergic reactions to medicines. What happens before the procedure? Staying hydrated Follow instructions from your health care provider about hydration, which may include:  Up to 2 hours before the procedure - you may continue to drink clear liquids, such as water, clear fruit juice, black coffee, and plain tea.   Eating and drinking restrictions Follow  instructions from your health care provider about eating and drinking, which may include:  8 hours before the procedure - stop eating heavy meals or foods, such as meat, fried foods, or fatty foods.  6 hours before the procedure - stop eating light meals or foods, such as toast or cereal.  6 hours before the procedure - stop drinking milk or drinks that contain milk.  2 hours before the procedure - stop drinking clear liquids. Medicines Ask your health care provider about:  Changing or stopping your regular medicines. This is especially important if you are taking diabetes medicines or blood thinners.  Taking medicines such as aspirin and ibuprofen. These medicines can thin your blood. Do not take these medicines unless your health care provider tells you to take them.  Taking over-the-counter medicines, vitamins, herbs, and supplements. Tests You may have:  A heart evaluation. This may include: ? An electrocardiogram (ECG). This involves placing patches on your skin to check your heart rhythm. ? A chest X-ray. ? An echocardiogram. This is a test that uses sound waves (ultrasound) to produce an image of the heart. ? A cardiac rhythm monitor. This is used to record your heart rhythm and any events for a longer period of time.  Blood tests.  Genetic testing. General instructions  Do not use any products that contain nicotine or tobacco for at least 4 weeks before the procedure. These products include cigarettes, e-cigarettes, and chewing tobacco. If you need help quitting, ask your health care provider.  Ask your health care provider: ? How your surgery site will be marked. ? What steps will be taken to help prevent infection. These steps may include:  Removing hair at the surgery site.  Washing skin with a germ-killing soap.  Receiving antibiotic medicine.  Plan to have someone take you home from the hospital or clinic.  If you will be going home right after the procedure,  plan to have someone with you for 24 hours. What happens during the procedure?  An IV will be inserted into one of your veins.  You will be given one or more of the following: ? A medicine to help you relax (sedative). ? A medicine to numb the area (local anesthetic). ? A medicine to make you fall asleep (general anesthetic).  The next steps vary depending on the type of pacemaker you will be getting. ? If you are getting a transvenous pacemaker:  An incision will be made in your upper chest.  A pocket will be made for the pacemaker. It may be placed under the skin or between layers of muscle.  The lead will be inserted into a blood vessel that goes to the heart.  While X-rays are taken by an imaging machine (fluoroscopy), the lead will be advanced through the vein to the inside of your heart.  The other end of the lead will be tunneled under the skin and attached to the pacemaker. ? If you are getting an epicardial pacemaker:  An incision will be made near your ribs or breastbone (sternum) for the lead.  The lead will be attached to the outside of your heart.  Another incision will be made in your chest or upper abdomen to create a pocket for the pacemaker.  The free end of the lead will be tunneled under the skin and attached to the pacemaker.  The transvenous or epicardial pacemaker will be tested. Imaging studies may be done to check the lead position.  The incisions will be closed with stitches (sutures), adhesive strips, or skin glue.  Bandages (dressings) will be placed over the incisions. The procedure may vary among health care providers and hospitals. What happens after the procedure?  Your blood pressure, heart rate, breathing rate, and blood oxygen level will be monitored until you leave the hospital or clinic.  You may be given antibiotics.  You will be given pain medicine.  An ECG and chest X-rays will be done.  You may need to wear a continuous type of  ECG (Holter monitor) to check your heart rhythm.  Your health care provider will program the pacemaker.  If you were given a sedative during the procedure, it can affect you for several hours. Do not drive or operate machinery until your health care provider says that it is safe.  You will be given a pacemaker identification card. This card lists the implant date, device model, and manufacturer of your pacemaker. Summary  A pacemaker is a small computer that sends electrical signals to the heart and helps the heart beat normally.  There are different types of pacemakers. A pacemaker may be placed under the skin or muscle of your chest or abdomen.  Follow instructions from your health care provider about eating and drinking and about taking medicines before the procedure. This information is not intended to replace advice given to you by your health care provider. Make sure you discuss any questions you have with your health care provider. Document Revised: 02/02/2019 Document Reviewed: 02/02/2019 Elsevier Patient Education  2021 [employer].

## 2020-08-15 NOTE — H&P (View-Only) (Signed)
HPI Rachel Fox is referred by Nada Boozer, NP for evaluation of symptomatic tach-brady syndrome. The patient is a pleasant 75 yo woman with spells of dizziness and sob. Evaluation demonstrated preserved LV function and a heart monitor demonstrated atrial fib and flutter with a RVR as well as pauses of almost 4 seconds and daytime sinus bradycardia. She has not had frank syncope but has had multiple spells where she will get dizzy.  Allergies  Allergen Reactions  . Lactose Intolerance (Gi) Other (See Comments)    Causes Gas in patient.     Current Outpatient Medications  Medication Sig Dispense Refill  . acetaminophen (TYLENOL) 325 MG tablet Take 2 tablets (650 mg total) by mouth every 4 (four) hours as needed for mild pain (or temp > 37.5 C (99.5 F)).    Marland Kitchen amiodarone (PACERONE) 100 MG tablet Take 1 tablet (100 mg total) by mouth daily. 90 tablet 3  . losartan (COZAAR) 25 MG tablet Take 1 tablet (25 mg total) by mouth daily. 90 tablet 3  . polyethylene glycol (MIRALAX / GLYCOLAX) 17 g packet Take 17 g by mouth daily.    Marland Kitchen apixaban (ELIQUIS) 5 MG TABS tablet Take 1 tablet (5 mg total) by mouth 2 (two) times daily. 60 tablet 11  . atorvastatin (LIPITOR) 80 MG tablet Take 1 tablet (80 mg total) by mouth daily at 6 PM. 30 tablet 1   No current facility-administered medications for this visit.     Past Medical History:  Diagnosis Date  . Atrial fibrillation (HCC)   . CVA (cerebral vascular accident) (HCC)   . High cholesterol   . Hypertension   . Type 2 diabetes mellitus (HCC)     ROS:   All systems reviewed and negative except as noted in the HPI.   Past Surgical History:  Procedure Laterality Date  . ABDOMINAL HYSTERECTOMY    . TUBAL LIGATION       Family History  Problem Relation Age of Onset  . Stroke Mother   . Heart attack Father   . Stroke Sister   . Heart attack Sister      Social History   Socioeconomic History  . Marital status: Divorced     Spouse name: Not on file  . Number of children: Not on file  . Years of education: Not on file  . Highest education level: Not on file  Occupational History  . Not on file  Tobacco Use  . Smoking status: Former Smoker    Quit date: 04/14/1988    Years since quitting: 32.3  . Smokeless tobacco: Never Used  Vaping Use  . Vaping Use: Never used  Substance and Sexual Activity  . Alcohol use: Never  . Drug use: Never  . Sexual activity: Not on file  Other Topics Concern  . Not on file  Social History Narrative  . Not on file   Social Determinants of Health   Financial Resource Strain: Not on file  Food Insecurity: Not on file  Transportation Needs: Not on file  Physical Activity: Not on file  Stress: Not on file  Social Connections: Not on file  Intimate Partner Violence: Not on file     Ht 5\' 3"  (1.6 m)   Wt 123 lb 6.4 oz (56 kg)   BMI 21.86 kg/m   Physical Exam:  Well appearing NAD HEENT: Unremarkable Neck:  No JVD, no thyromegally Lymphatics:  No adenopathy Back:  No CVA tenderness Lungs:  Clear  with no wheezes HEART:  Regular rate rhythm, no murmurs, no rubs, no clicks Abd:  soft, positive bowel sounds, no organomegally, no rebound, no guarding Ext:  2 plus pulses, no edema, no cyanosis, no clubbing Skin:  No rashes no nodules Neuro:  CN II through XII intact, motor grossly intact  EKG - sinus brady with LAE  Assess/Plan: 1. Symptomatic tachy brady syndrome - she has pauses of almost 5 seconds and atrial fib and flutter with a RVR. I have recommended insertion of a PPM with uptitration as needed of her AV nodal and and sinus node slowing meds. The risks/benefits/goals/expectations of the procedure were reviewed and she will call us if she wishes to proceed with PPM insertion. 2. Atrial fib - she will continue low dose amio 3. Atrial flutter - continue amio. I would not recommend ablation at this time for either the flutter or the fib. 4. Coags - she will  continue eliquis. If she decides to undergo PPM then I would recommend holding the eliquis.  Florine Sprenkle,MD 

## 2020-08-15 NOTE — Progress Notes (Signed)
HPI Rachel Fox is referred by Nada Boozer, NP for evaluation of symptomatic tach-brady syndrome. The patient is a pleasant 75 yo woman with spells of dizziness and sob. Evaluation demonstrated preserved LV function and a heart monitor demonstrated atrial fib and flutter with a RVR as well as pauses of almost 4 seconds and daytime sinus bradycardia. She has not had frank syncope but has had multiple spells where she will get dizzy.  Allergies  Allergen Reactions  . Lactose Intolerance (Gi) Other (See Comments)    Causes Gas in patient.     Current Outpatient Medications  Medication Sig Dispense Refill  . acetaminophen (TYLENOL) 325 MG tablet Take 2 tablets (650 mg total) by mouth every 4 (four) hours as needed for mild pain (or temp > 37.5 C (99.5 F)).    Marland Kitchen amiodarone (PACERONE) 100 MG tablet Take 1 tablet (100 mg total) by mouth daily. 90 tablet 3  . losartan (COZAAR) 25 MG tablet Take 1 tablet (25 mg total) by mouth daily. 90 tablet 3  . polyethylene glycol (MIRALAX / GLYCOLAX) 17 g packet Take 17 g by mouth daily.    Marland Kitchen apixaban (ELIQUIS) 5 MG TABS tablet Take 1 tablet (5 mg total) by mouth 2 (two) times daily. 60 tablet 11  . atorvastatin (LIPITOR) 80 MG tablet Take 1 tablet (80 mg total) by mouth daily at 6 PM. 30 tablet 1   No current facility-administered medications for this visit.     Past Medical History:  Diagnosis Date  . Atrial fibrillation (HCC)   . CVA (cerebral vascular accident) (HCC)   . High cholesterol   . Hypertension   . Type 2 diabetes mellitus (HCC)     ROS:   All systems reviewed and negative except as noted in the HPI.   Past Surgical History:  Procedure Laterality Date  . ABDOMINAL HYSTERECTOMY    . TUBAL LIGATION       Family History  Problem Relation Age of Onset  . Stroke Mother   . Heart attack Father   . Stroke Sister   . Heart attack Sister      Social History   Socioeconomic History  . Marital status: Divorced     Spouse name: Not on file  . Number of children: Not on file  . Years of education: Not on file  . Highest education level: Not on file  Occupational History  . Not on file  Tobacco Use  . Smoking status: Former Smoker    Quit date: 04/14/1988    Years since quitting: 32.3  . Smokeless tobacco: Never Used  Vaping Use  . Vaping Use: Never used  Substance and Sexual Activity  . Alcohol use: Never  . Drug use: Never  . Sexual activity: Not on file  Other Topics Concern  . Not on file  Social History Narrative  . Not on file   Social Determinants of Health   Financial Resource Strain: Not on file  Food Insecurity: Not on file  Transportation Needs: Not on file  Physical Activity: Not on file  Stress: Not on file  Social Connections: Not on file  Intimate Partner Violence: Not on file     Ht 5\' 3"  (1.6 m)   Wt 123 lb 6.4 oz (56 kg)   BMI 21.86 kg/m   Physical Exam:  Well appearing NAD HEENT: Unremarkable Neck:  No JVD, no thyromegally Lymphatics:  No adenopathy Back:  No CVA tenderness Lungs:  Clear  with no wheezes HEART:  Regular rate rhythm, no murmurs, no rubs, no clicks Abd:  soft, positive bowel sounds, no organomegally, no rebound, no guarding Ext:  2 plus pulses, no edema, no cyanosis, no clubbing Skin:  No rashes no nodules Neuro:  CN II through XII intact, motor grossly intact  EKG - sinus brady with LAE  Assess/Plan: 1. Symptomatic tachy brady syndrome - she has pauses of almost 5 seconds and atrial fib and flutter with a RVR. I have recommended insertion of a PPM with uptitration as needed of her AV nodal and and sinus node slowing meds. The risks/benefits/goals/expectations of the procedure were reviewed and she will call us if she wishes to proceed with PPM insertion. 2. Atrial fib - she will continue low dose amio 3. Atrial flutter - continue amio. I would not recommend ablation at this time for either the flutter or the fib. 4. Coags - she will  continue eliquis. If she decides to undergo PPM then I would recommend holding the eliquis.  Sharlot Gowda Nilton Lave,MD

## 2020-08-21 NOTE — Pre-Procedure Instructions (Signed)
Attempted to call patient regarding procedure instructions.  Left voice mail on the following items: Arrival time 1230 Nothing to eat or drink after midnight No meds AM of procedure Responsible person to drive you home and stay with you for 24 hrs Wash with special soap night before and morning of procedure If on anti-coagulant drug instructions Eliquis- last dose on 6/6

## 2020-08-22 ENCOUNTER — Ambulatory Visit (HOSPITAL_COMMUNITY)
Admission: RE | Admit: 2020-08-22 | Discharge: 2020-08-22 | Disposition: A | Discharge status: Home / Self Care | Payer: Medicare Other | Attending: Internal Medicine | Admitting: Internal Medicine

## 2020-08-22 ENCOUNTER — Ambulatory Visit (HOSPITAL_COMMUNITY): Payer: Medicare Other

## 2020-08-22 ENCOUNTER — Other Ambulatory Visit: Payer: Self-pay

## 2020-08-22 ENCOUNTER — Ambulatory Visit (HOSPITAL_COMMUNITY)
Admission: RE | Disposition: A | Discharge status: Home / Self Care | Payer: Self-pay | Source: Home / Self Care | Attending: Internal Medicine

## 2020-08-22 DIAGNOSIS — I495 Sick sinus syndrome: Secondary | ICD-10-CM | POA: Diagnosis not present

## 2020-08-22 DIAGNOSIS — Z7901 Long term (current) use of anticoagulants: Secondary | ICD-10-CM | POA: Insufficient documentation

## 2020-08-22 DIAGNOSIS — I4891 Unspecified atrial fibrillation: Secondary | ICD-10-CM | POA: Insufficient documentation

## 2020-08-22 DIAGNOSIS — Z8249 Family history of ischemic heart disease and other diseases of the circulatory system: Secondary | ICD-10-CM | POA: Diagnosis not present

## 2020-08-22 DIAGNOSIS — Z87891 Personal history of nicotine dependence: Secondary | ICD-10-CM | POA: Diagnosis not present

## 2020-08-22 DIAGNOSIS — Z79899 Other long term (current) drug therapy: Secondary | ICD-10-CM | POA: Diagnosis not present

## 2020-08-22 DIAGNOSIS — I4892 Unspecified atrial flutter: Secondary | ICD-10-CM | POA: Diagnosis not present

## 2020-08-22 DIAGNOSIS — Z950 Presence of cardiac pacemaker: Secondary | ICD-10-CM

## 2020-08-22 HISTORY — PX: PACEMAKER IMPLANT: EP1218

## 2020-08-22 LAB — GLUCOSE, CAPILLARY: Glucose-Capillary: 90 mg/dL (ref 70–99)

## 2020-08-22 SURGERY — PACEMAKER IMPLANT

## 2020-08-22 MED ORDER — MIDAZOLAM HCL 5 MG/5ML IJ SOLN
INTRAMUSCULAR | Status: AC
  Filled 2020-08-22: qty 5

## 2020-08-22 MED ORDER — POVIDONE-IODINE 10 % EX SWAB
2.0000 "application " | Freq: Once | CUTANEOUS | Status: AC
  Administered 2020-08-22: 2 via TOPICAL

## 2020-08-22 MED ORDER — FENTANYL CITRATE (PF) 100 MCG/2ML IJ SOLN
INTRAMUSCULAR | Status: DC | PRN
  Administered 2020-08-22 (×2): 12.5 ug via INTRAVENOUS

## 2020-08-22 MED ORDER — IOHEXOL 350 MG/ML SOLN
INTRAVENOUS | Status: DC | PRN
  Administered 2020-08-22: 10 mL

## 2020-08-22 MED ORDER — HEPARIN (PORCINE) IN NACL 1000-0.9 UT/500ML-% IV SOLN
INTRAVENOUS | Status: AC
  Filled 2020-08-22: qty 500

## 2020-08-22 MED ORDER — LIDOCAINE HCL (PF) 1 % IJ SOLN
INTRAMUSCULAR | Status: DC | PRN
  Administered 2020-08-22: 20 mL

## 2020-08-22 MED ORDER — CEFAZOLIN SODIUM-DEXTROSE 2-4 GM/100ML-% IV SOLN
INTRAVENOUS | Status: AC
  Filled 2020-08-22: qty 100

## 2020-08-22 MED ORDER — ACETAMINOPHEN 325 MG PO TABS
325.0000 mg | ORAL_TABLET | ORAL | Status: DC | PRN
  Filled 2020-08-22: qty 2

## 2020-08-22 MED ORDER — HEPARIN (PORCINE) IN NACL 1000-0.9 UT/500ML-% IV SOLN
INTRAVENOUS | Status: DC | PRN
  Administered 2020-08-22: 500 mL

## 2020-08-22 MED ORDER — SODIUM CHLORIDE 0.9 % IV SOLN: INTRAVENOUS | Status: DC

## 2020-08-22 MED ORDER — SODIUM CHLORIDE 0.9 % IV SOLN
INTRAVENOUS | Status: AC
  Filled 2020-08-22: qty 2

## 2020-08-22 MED ORDER — LIDOCAINE HCL 1 % IJ SOLN
INTRAMUSCULAR | Status: AC
  Filled 2020-08-22: qty 60

## 2020-08-22 MED ORDER — SODIUM CHLORIDE 0.9 % IV SOLN
80.0000 mg | INTRAVENOUS | Status: AC
  Administered 2020-08-22: 15:00:00 80 mg

## 2020-08-22 MED ORDER — ONDANSETRON HCL 4 MG/2ML IJ SOLN: 4.0000 mg | Freq: Four times a day (QID) | INTRAMUSCULAR | Status: DC | PRN

## 2020-08-22 MED ORDER — CEFAZOLIN SODIUM-DEXTROSE 2-4 GM/100ML-% IV SOLN
2.0000 g | INTRAVENOUS | Status: AC
  Administered 2020-08-22: 14:00:00 2 g via INTRAVENOUS

## 2020-08-22 MED ORDER — CEFAZOLIN SODIUM-DEXTROSE 1-4 GM/50ML-% IV SOLN
1.0000 g | Freq: Once | INTRAVENOUS | Status: AC
  Administered 2020-08-22: 1 g via INTRAVENOUS
  Filled 2020-08-22: qty 50

## 2020-08-22 MED ORDER — FENTANYL CITRATE (PF) 100 MCG/2ML IJ SOLN
INTRAMUSCULAR | Status: AC
  Filled 2020-08-22: qty 2

## 2020-08-22 MED ORDER — CHLORHEXIDINE GLUCONATE 4 % EX LIQD
4.0000 "application " | Freq: Once | CUTANEOUS | Status: DC
  Filled 2020-08-22: qty 60

## 2020-08-22 MED ORDER — SODIUM CHLORIDE 0.9 % IV SOLN
INTRAVENOUS | Status: AC | PRN
  Administered 2020-08-22: 500 mL via INTRAVENOUS

## 2020-08-22 MED ORDER — MIDAZOLAM HCL 5 MG/5ML IJ SOLN
INTRAMUSCULAR | Status: DC | PRN
  Administered 2020-08-22 (×2): 1 mg via INTRAVENOUS

## 2020-08-22 SURGICAL SUPPLY — 8 items
CABLE SURGICAL S-101-97-12 (CABLE) ×3 IMPLANT
KIT MICROPUNCTURE NIT STIFF (SHEATH) ×3 IMPLANT
LEAD TENDRIL MRI 46CM LPA1200M (Lead) ×3 IMPLANT
LEAD TENDRIL MRI 52CM LPA1200M (Lead) ×3 IMPLANT
PACEMAKER ASSURITY DR-RF (Pacemaker) ×3 IMPLANT
PAD PRO RADIOLUCENT 2001M-C (PAD) ×3 IMPLANT
SHEATH 8FR PRELUDE SNAP 13 (SHEATH) ×6 IMPLANT
TRAY PACEMAKER INSERTION (PACKS) ×3 IMPLANT

## 2020-08-22 NOTE — Discharge Instructions (Signed)
.     Supplemental Discharge Instructions for  Pacemaker/Defibrillator Patients  Tomorrow, 08/23/20, send in a device transmission  Activity No heavy lifting or vigorous activity with your left/right arm for 6 to 8 weeks.  Do not raise your left/right arm above your head for one week.  Gradually raise your affected arm as drawn below.              08/29/20                    08/30/20                    08/31/20                   09/01/20 __  NO DRIVING for  1 week   ; you may begin driving on  0/27/74    .  WOUND CARE - Keep the wound area clean and dry.  Do not get this area wet , no showers until cleared to at your wound check visit . - Tomorrow, 08/23/20, remove the arm sling - Tomorrow, 08/23/20 remove the LARGE outer plastic bandage.  Underneath the plastic bandage there are steri strips (paper tapes), DO NOT remove these. - The tape/steri-strips on your wound will fall off; do not pull them off.  No bandage is needed on the site.  DO  NOT apply any creams, oils, or ointments to the wound area. - If you notice any drainage or discharge from the wound, any swelling or bruising at the site, or you develop a fever > 101? F after you are discharged home, call the office at once.  Special Instructions - You are still able to use cellular telephones; use the ear opposite the side where you have your pacemaker/defibrillator.  Avoid carrying your cellular phone near your device. - When traveling through airports, show security personnel your identification card to avoid being screened in the metal detectors.  Ask the security personnel to use the hand wand. - Avoid arc welding equipment, MRI testing (magnetic resonance imaging), TENS units (transcutaneous nerve stimulators).  Call the office for questions about other devices. - Avoid electrical appliances that are in poor condition or are not properly grounded. - Microwave ovens are safe to be near or to operate.

## 2020-08-22 NOTE — Interval H&P Note (Signed)
History and Physical Interval Note:  08/22/2020 1:52 PM  Rachel Fox  has presented today for surgery, with the diagnosis of bradycardia.  The various methods of treatment have been discussed with the patient and family. After consideration of risks, benefits and other options for treatment, the patient has consented to  Procedure(s): PACEMAKER IMPLANT (N/A) as a surgical intervention.  The patient's history has been reviewed, patient examined, no change in status, stable for surgery.  I have reviewed the patient's chart and labs.  Questions were answered to the patient's satisfaction.     Lewayne Bunting

## 2020-08-23 ENCOUNTER — Encounter (HOSPITAL_COMMUNITY): Payer: Self-pay | Admitting: Internal Medicine

## 2020-08-23 ENCOUNTER — Telehealth: Payer: Self-pay | Admitting: Internal Medicine

## 2020-08-23 MED FILL — Lidocaine HCl Local Inj 1%: INTRAMUSCULAR | Qty: 40 | Status: AC

## 2020-08-23 MED FILL — Lidocaine HCl Local Inj 1%: INTRAMUSCULAR | Qty: 20 | Status: AC

## 2020-08-23 NOTE — Telephone Encounter (Signed)
Patient is wanting to know when she is supposed to start taking her Eliquis .

## 2020-08-23 NOTE — Progress Notes (Addendum)
Pt unsure when to restart Eliquis after PPM placement. Advised pt to call Dr. Lubertha Basque office to confirm when she should restart it.

## 2020-08-23 NOTE — Telephone Encounter (Signed)
Patient will resume eliquis today

## 2020-08-28 ENCOUNTER — Telehealth: Payer: Self-pay | Admitting: Internal Medicine

## 2020-08-28 ENCOUNTER — Ambulatory Visit: Payer: Medicare Other

## 2020-08-28 ENCOUNTER — Telehealth: Payer: Self-pay

## 2020-08-28 NOTE — Telephone Encounter (Signed)
Patient called and reports she has left arm swelling/warmth to touch. Denies any complaints at [facility] site. Denies any fever or chills. Device clinic apt. Today @ 3:20 . Patient will call back in 15 minutes to see if someone can bring patient. If not we will work on getting patient transportation via [employer].

## 2020-08-28 NOTE — Telephone Encounter (Signed)
New message    Patient just had pacemaker installed on 6/8 she is having arm pain, tingling she thinks her arm is swollen and warm

## 2020-08-28 NOTE — Telephone Encounter (Signed)
Patient returned phone call and reports she is not able to make it today d/t the person who gets her ready is not available to her today.   Patient does require assistance to get in a car so unable to use [facility] at this time. Explained to patient that there is no provider in office tomorrow so we are not able to see her tomorrow. Patient voices that she does not know when she would be able to come to [facility] [place]. Advised patient that she needs to go to an Urgent Care or Emergency Department for evaluation. Patient verbalied understanding.

## 2020-08-29 ENCOUNTER — Encounter: Payer: Self-pay | Admitting: Emergency Medicine

## 2020-08-29 ENCOUNTER — Other Ambulatory Visit: Payer: Self-pay

## 2020-08-29 ENCOUNTER — Ambulatory Visit
Admission: EM | Admit: 2020-08-29 | Discharge: 2020-08-29 | Disposition: A | Discharge status: Home / Self Care | Payer: Medicare Other | Attending: Family Medicine | Admitting: Family Medicine

## 2020-08-29 DIAGNOSIS — I10 Essential (primary) hypertension: Secondary | ICD-10-CM

## 2020-08-29 DIAGNOSIS — M79622 Pain in left upper arm: Secondary | ICD-10-CM | POA: Diagnosis not present

## 2020-08-29 NOTE — ED Triage Notes (Signed)
Pain to LT arm/ shoulder area with movement since pt was d/c from hospital on 08/22/2020 after having pace maker placed. Pt also reports she feels like there is some swelling to the upper LT arm.

## 2020-08-29 NOTE — Discharge Instructions (Addendum)
Your blood pressure was noted to be elevated during your visit today. If you are currently taking medication for high blood pressure, please ensure you are taking this as directed. If you do not have a history of high blood pressure and your blood pressure remains persistently elevated, you may need to begin taking a medication at some point. You may return here within the next few days to recheck if unable to see your primary care provider or if you do not have a one.  BP (!) 200/85 (BP Location: Right Arm) Comment: pt states her bp has been running high recently  Pulse (!) 59   Temp 98.8 F (37.1 C) (Oral)   Resp 17   SpO2 95%   BP Readings from Last 3 Encounters:  08/29/20 (!) 200/85  08/22/20 (!) 183/69  08/15/20 (!) 220/88

## 2020-09-01 NOTE — ED Provider Notes (Signed)
[facility]   [id number] 08/29/20 Arrival Time: 1515  ASSESSMENT & PLAN:  1. Left axillary pain   2. Uncontrolled hypertension    Loosened tape around lateral dressing; reports relief. No signs of infection or LUE clot. Reassured. Keep scheduled follow ups. May return here as needed.    Discharge Instructions      Your blood pressure was noted to be elevated during your visit today. If you are currently taking medication for high blood pressure, please ensure you are taking this as directed. If you do not have a history of high blood pressure and your blood pressure remains persistently elevated, you may need to begin taking a medication at some point. You may return here within the next few days to recheck if unable to see your primary care provider or if you do not have a one.  BP (!) 200/85 (BP Location: Right Arm) Comment: pt states her bp has been running high recently  Pulse (!) 59   Temp 98.8 F (37.1 C) (Oral)   Resp 17   SpO2 95%   BP Readings from Last 3 Encounters:  08/29/20 (!) 200/85  08/22/20 (!) 183/69  08/15/20 (!) 220/88          Follow-up Information     Benita Stabile, MD.   Specialty: Internal Medicine Why: As needed. Contact information: [address] [place] [facility] [address] [phone]                 Reviewed expectations re: course of current medical issues. Questions answered. Outlined signs and symptoms indicating need for more acute intervention. Understanding verbalized. After Visit Summary given.   SUBJECTIVE: History from: patient. Rachel Fox is a 75 y.o. female who reports pacemaker replacement 08/22/20. Pain around L axilla since. No LUE swelling. No extremity sensation changes or weakness. Afebrile. Otherwise feeling well. Normal PO intake without n/v/d. Increased blood pressure noted today. "Running high lately." Reports that she is treated for HTN. She reports taking medications as instructed, no  medication side effects noted, no dyspnea on exertion, no swelling of ankles, no orthostatic dizziness or lightheadedness, no orthopnea or paroxysmal nocturnal dyspnea, and no palpitations.  OBJECTIVE:  Vitals:   08/29/20 1557  BP: (!) 200/85  Pulse: (!) 59  Resp: 17  Temp: 98.8 F (37.1 C)  TempSrc: Oral  SpO2: 95%    General appearance: alert; no distress Eyes: PERRLA; EOMI; conjunctiva normal HENT: [place]; AT Neck: supple  CV: reg Lungs: speaks full sentences without difficulty; unlabored Extremities: no edema; bandage tape over pacemaker pulling skin at L axilla Skin: warm and dry Neurologic: normal gait Psychological: alert and cooperative; normal mood and affect  Allergies  Allergen Reactions   Lactose Intolerance (Gi) Other (See Comments)    Causes Gas in patient.    Past Medical History:  Diagnosis Date   Atrial fibrillation (HCC)    CVA (cerebral vascular accident) (HCC)    High cholesterol    Hypertension    Type 2 diabetes mellitus (HCC)    Social History   Socioeconomic History   Marital status: Divorced    Spouse name: Not on file   Number of children: Not on file   Years of education: Not on file   Highest education level: Not on file  Occupational History   Not on file  Tobacco Use   Smoking status: Former    Pack years: 0.00    Types: Cigarettes    Quit date: 04/14/1988  Years since quitting: 32.4   Smokeless tobacco: Never  Vaping Use   Vaping Use: Never used  Substance and Sexual Activity   Alcohol use: Never   Drug use: Never   Sexual activity: Not on file  Other Topics Concern   Not on file  Social History Narrative   Not on file   Social Determinants of Health   Financial Resource Strain: Not on file  Food Insecurity: Not on file  Transportation Needs: Not on file  Physical Activity: Not on file  Stress: Not on file  Social Connections: Not on file  Intimate Partner Violence: Not on file   Family History  Problem Relation  Age of Onset   Stroke Mother    Heart attack Father    Stroke Sister    Heart attack Sister    Past Surgical History:  Procedure Laterality Date   ABDOMINAL HYSTERECTOMY     PACEMAKER IMPLANT N/A 08/22/2020   Procedure: PACEMAKER IMPLANT;  Surgeon: Marinus Maw, MD;  Location: [facility];  Service: Cardiovascular;  Laterality: N/A;   TUBAL LIGATION       Mardella Layman, MD 09/01/20 [phone]

## 2020-09-04 ENCOUNTER — Ambulatory Visit: Payer: Medicare Other

## 2020-09-13 DIAGNOSIS — E1165 Type 2 diabetes mellitus with hyperglycemia: Secondary | ICD-10-CM | POA: Diagnosis not present

## 2020-09-13 DIAGNOSIS — R350 Frequency of micturition: Secondary | ICD-10-CM | POA: Diagnosis not present

## 2020-09-14 ENCOUNTER — Ambulatory Visit: Payer: Medicare Other | Admitting: Student

## 2020-09-23 DIAGNOSIS — Z20822 Contact with and (suspected) exposure to covid-19: Secondary | ICD-10-CM | POA: Diagnosis not present

## 2020-10-09 ENCOUNTER — Other Ambulatory Visit: Payer: Self-pay

## 2020-10-09 ENCOUNTER — Inpatient Hospital Stay (HOSPITAL_COMMUNITY)
Admission: EM | Admit: 2020-10-09 | Discharge: 2020-10-12 | DRG: 522 | Disposition: A | Discharge status: Skilled Nursing Facility | Payer: Medicare Other | Attending: Family Medicine | Admitting: Family Medicine

## 2020-10-09 ENCOUNTER — Encounter (HOSPITAL_COMMUNITY): Payer: Self-pay | Admitting: Emergency Medicine

## 2020-10-09 ENCOUNTER — Emergency Department (HOSPITAL_COMMUNITY): Payer: Medicare Other

## 2020-10-09 DIAGNOSIS — W1830XA Fall on same level, unspecified, initial encounter: Secondary | ICD-10-CM | POA: Diagnosis present

## 2020-10-09 DIAGNOSIS — I639 Cerebral infarction, unspecified: Secondary | ICD-10-CM

## 2020-10-09 DIAGNOSIS — Z01810 Encounter for preprocedural cardiovascular examination: Secondary | ICD-10-CM | POA: Diagnosis not present

## 2020-10-09 DIAGNOSIS — R0602 Shortness of breath: Secondary | ICD-10-CM | POA: Diagnosis not present

## 2020-10-09 DIAGNOSIS — Z87891 Personal history of nicotine dependence: Secondary | ICD-10-CM | POA: Diagnosis not present

## 2020-10-09 DIAGNOSIS — Z90710 Acquired absence of both cervix and uterus: Secondary | ICD-10-CM | POA: Diagnosis not present

## 2020-10-09 DIAGNOSIS — I69354 Hemiplegia and hemiparesis following cerebral infarction affecting left non-dominant side: Secondary | ICD-10-CM

## 2020-10-09 DIAGNOSIS — E119 Type 2 diabetes mellitus without complications: Secondary | ICD-10-CM | POA: Diagnosis not present

## 2020-10-09 DIAGNOSIS — R279 Unspecified lack of coordination: Secondary | ICD-10-CM | POA: Diagnosis not present

## 2020-10-09 DIAGNOSIS — E876 Hypokalemia: Secondary | ICD-10-CM | POA: Diagnosis present

## 2020-10-09 DIAGNOSIS — W19XXXA Unspecified fall, initial encounter: Secondary | ICD-10-CM | POA: Diagnosis not present

## 2020-10-09 DIAGNOSIS — Z950 Presence of cardiac pacemaker: Secondary | ICD-10-CM

## 2020-10-09 DIAGNOSIS — I4821 Permanent atrial fibrillation: Secondary | ICD-10-CM | POA: Diagnosis not present

## 2020-10-09 DIAGNOSIS — M6281 Muscle weakness (generalized): Secondary | ICD-10-CM | POA: Diagnosis not present

## 2020-10-09 DIAGNOSIS — M25561 Pain in right knee: Secondary | ICD-10-CM | POA: Diagnosis not present

## 2020-10-09 DIAGNOSIS — Z8249 Family history of ischemic heart disease and other diseases of the circulatory system: Secondary | ICD-10-CM | POA: Diagnosis not present

## 2020-10-09 DIAGNOSIS — E1165 Type 2 diabetes mellitus with hyperglycemia: Secondary | ICD-10-CM | POA: Diagnosis not present

## 2020-10-09 DIAGNOSIS — I10 Essential (primary) hypertension: Secondary | ICD-10-CM | POA: Diagnosis not present

## 2020-10-09 DIAGNOSIS — D62 Acute posthemorrhagic anemia: Secondary | ICD-10-CM | POA: Diagnosis not present

## 2020-10-09 DIAGNOSIS — Z471 Aftercare following joint replacement surgery: Secondary | ICD-10-CM | POA: Diagnosis not present

## 2020-10-09 DIAGNOSIS — Z823 Family history of stroke: Secondary | ICD-10-CM | POA: Diagnosis not present

## 2020-10-09 DIAGNOSIS — Z7901 Long term (current) use of anticoagulants: Secondary | ICD-10-CM | POA: Diagnosis not present

## 2020-10-09 DIAGNOSIS — R2681 Unsteadiness on feet: Secondary | ICD-10-CM | POA: Diagnosis not present

## 2020-10-09 DIAGNOSIS — S7012XA Contusion of left thigh, initial encounter: Secondary | ICD-10-CM | POA: Diagnosis not present

## 2020-10-09 DIAGNOSIS — I495 Sick sinus syndrome: Secondary | ICD-10-CM | POA: Diagnosis present

## 2020-10-09 DIAGNOSIS — E1169 Type 2 diabetes mellitus with other specified complication: Secondary | ICD-10-CM

## 2020-10-09 DIAGNOSIS — E7849 Other hyperlipidemia: Secondary | ICD-10-CM

## 2020-10-09 DIAGNOSIS — I482 Chronic atrial fibrillation, unspecified: Secondary | ICD-10-CM | POA: Diagnosis present

## 2020-10-09 DIAGNOSIS — I6930 Unspecified sequelae of cerebral infarction: Secondary | ICD-10-CM | POA: Diagnosis present

## 2020-10-09 DIAGNOSIS — E7800 Pure hypercholesterolemia, unspecified: Secondary | ICD-10-CM | POA: Diagnosis present

## 2020-10-09 DIAGNOSIS — S72002A Fracture of unspecified part of neck of left femur, initial encounter for closed fracture: Secondary | ICD-10-CM | POA: Diagnosis not present

## 2020-10-09 DIAGNOSIS — S7011XA Contusion of right thigh, initial encounter: Secondary | ICD-10-CM | POA: Diagnosis not present

## 2020-10-09 DIAGNOSIS — I4891 Unspecified atrial fibrillation: Secondary | ICD-10-CM | POA: Diagnosis present

## 2020-10-09 DIAGNOSIS — Z20822 Contact with and (suspected) exposure to covid-19: Secondary | ICD-10-CM | POA: Diagnosis present

## 2020-10-09 DIAGNOSIS — S72002D Fracture of unspecified part of neck of left femur, subsequent encounter for closed fracture with routine healing: Secondary | ICD-10-CM | POA: Diagnosis not present

## 2020-10-09 DIAGNOSIS — S7012XD Contusion of left thigh, subsequent encounter: Secondary | ICD-10-CM | POA: Diagnosis not present

## 2020-10-09 DIAGNOSIS — M62422 Contracture of muscle, left upper arm: Secondary | ICD-10-CM | POA: Diagnosis not present

## 2020-10-09 DIAGNOSIS — S72042A Displaced fracture of base of neck of left femur, initial encounter for closed fracture: Secondary | ICD-10-CM | POA: Diagnosis not present

## 2020-10-09 DIAGNOSIS — Z96642 Presence of left artificial hip joint: Secondary | ICD-10-CM | POA: Diagnosis not present

## 2020-10-09 DIAGNOSIS — G8114 Spastic hemiplegia affecting left nondominant side: Secondary | ICD-10-CM | POA: Diagnosis not present

## 2020-10-09 DIAGNOSIS — I4892 Unspecified atrial flutter: Secondary | ICD-10-CM | POA: Diagnosis present

## 2020-10-09 DIAGNOSIS — Z9181 History of falling: Secondary | ICD-10-CM | POA: Diagnosis not present

## 2020-10-09 DIAGNOSIS — R569 Unspecified convulsions: Secondary | ICD-10-CM | POA: Diagnosis not present

## 2020-10-09 DIAGNOSIS — E785 Hyperlipidemia, unspecified: Secondary | ICD-10-CM | POA: Diagnosis not present

## 2020-10-09 DIAGNOSIS — Y92009 Unspecified place in unspecified non-institutional (private) residence as the place of occurrence of the external cause: Secondary | ICD-10-CM

## 2020-10-09 DIAGNOSIS — I69391 Dysphagia following cerebral infarction: Secondary | ICD-10-CM | POA: Diagnosis not present

## 2020-10-09 DIAGNOSIS — S8001XD Contusion of right knee, subsequent encounter: Secondary | ICD-10-CM | POA: Diagnosis not present

## 2020-10-09 DIAGNOSIS — Z79899 Other long term (current) drug therapy: Secondary | ICD-10-CM

## 2020-10-09 DIAGNOSIS — S8001XA Contusion of right knee, initial encounter: Secondary | ICD-10-CM | POA: Diagnosis present

## 2020-10-09 DIAGNOSIS — D649 Anemia, unspecified: Secondary | ICD-10-CM | POA: Diagnosis not present

## 2020-10-09 DIAGNOSIS — S72009A Fracture of unspecified part of neck of unspecified femur, initial encounter for closed fracture: Secondary | ICD-10-CM | POA: Diagnosis present

## 2020-10-09 DIAGNOSIS — E1159 Type 2 diabetes mellitus with other circulatory complications: Secondary | ICD-10-CM | POA: Diagnosis not present

## 2020-10-09 DIAGNOSIS — E739 Lactose intolerance, unspecified: Secondary | ICD-10-CM | POA: Diagnosis present

## 2020-10-09 DIAGNOSIS — R41841 Cognitive communication deficit: Secondary | ICD-10-CM | POA: Diagnosis not present

## 2020-10-09 DIAGNOSIS — Z043 Encounter for examination and observation following other accident: Secondary | ICD-10-CM | POA: Diagnosis not present

## 2020-10-09 DIAGNOSIS — Z9889 Other specified postprocedural states: Secondary | ICD-10-CM | POA: Diagnosis not present

## 2020-10-09 HISTORY — DX: Unspecified convulsions: R56.9

## 2020-10-09 LAB — COMPREHENSIVE METABOLIC PANEL
ALT: 26 U/L (ref 0–44)
AST: 31 U/L (ref 15–41)
Albumin: 3.6 g/dL (ref 3.5–5.0)
Alkaline Phosphatase: 59 U/L (ref 38–126)
Anion gap: 9 (ref 5–15)
BUN: 14 mg/dL (ref 8–23)
CO2: 29 mmol/L (ref 22–32)
Calcium: 9.7 mg/dL (ref 8.9–10.3)
Chloride: 102 mmol/L (ref 98–111)
Creatinine, Ser: 0.75 mg/dL (ref 0.44–1.00)
GFR, Estimated: >60 mL/min (ref >60)
Glucose, Bld: 112 mg/dL — ABNORMAL HIGH (ref 70–99)
Potassium: 3 mmol/L — ABNORMAL LOW (ref 3.5–5.1)
Sodium: 140 mmol/L (ref 135–145)
Total Bilirubin: 0.9 mg/dL (ref 0.3–1.2)
Total Protein: 7.7 g/dL (ref 6.5–8.1)

## 2020-10-09 LAB — CBG MONITORING, ED
Glucose-Capillary: 93 mg/dL (ref 70–99)
Glucose-Capillary: 83 mg/dL (ref 70–99)

## 2020-10-09 LAB — CBC WITH DIFFERENTIAL/PLATELET
Abs Immature Granulocytes: 0.02 10*3/uL (ref 0.00–0.07)
Basophils Absolute: 0 10*3/uL (ref 0.0–0.1)
Basophils Relative: 0 %
Eosinophils Absolute: 0.2 10*3/uL (ref 0.0–0.5)
Eosinophils Relative: 2 %
HCT: 41.6 % (ref 36.0–46.0)
Hemoglobin: 13.5 g/dL (ref 12.0–15.0)
Immature Granulocytes: 0 %
Lymphocytes Relative: 18 %
Lymphs Abs: 1.5 10*3/uL (ref 0.7–4.0)
MCH: 29.6 pg (ref 26.0–34.0)
MCHC: 32.5 g/dL (ref 30.0–36.0)
MCV: 91.2 fL (ref 80.0–100.0)
Monocytes Absolute: 0.7 10*3/uL (ref 0.1–1.0)
Monocytes Relative: 8 %
Neutro Abs: 5.9 10*3/uL (ref 1.7–7.7)
Neutrophils Relative %: 72 %
Platelets: 169 10*3/uL (ref 150–400)
RBC: 4.56 MIL/uL (ref 3.87–5.11)
RDW: 14.8 % (ref 11.5–15.5)
WBC: 8.4 10*3/uL (ref 4.0–10.5)
nRBC: 0 % (ref 0.0–0.2)

## 2020-10-09 LAB — URINALYSIS, ROUTINE W REFLEX MICROSCOPIC
Bilirubin Urine: NEGATIVE
Glucose, UA: NEGATIVE mg/dL
Hgb urine dipstick: NEGATIVE
Ketones, ur: 20 mg/dL — AB
Leukocytes,Ua: NEGATIVE
Nitrite: NEGATIVE
Protein, ur: NEGATIVE mg/dL
Specific Gravity, Urine: 1.019 (ref 1.005–1.030)
pH: 6 (ref 5.0–8.0)

## 2020-10-09 LAB — GLUCOSE, CAPILLARY: Glucose-Capillary: 113 mg/dL — ABNORMAL HIGH (ref 70–99)

## 2020-10-09 LAB — VITAMIN D 25 HYDROXY (VIT D DEFICIENCY, FRACTURES): Vit D, 25-Hydroxy: 35.9 ng/mL (ref 30–100)

## 2020-10-09 LAB — BRAIN NATRIURETIC PEPTIDE: B Natriuretic Peptide: 31 pg/mL (ref 0.0–100.0)

## 2020-10-09 LAB — RESP PANEL BY RT-PCR (FLU A&B, COVID) ARPGX2
Influenza A by PCR: NEGATIVE
Influenza B by PCR: NEGATIVE
SARS Coronavirus 2 by RT PCR: NEGATIVE

## 2020-10-09 LAB — TSH: TSH: 4.107 u[IU]/mL (ref 0.350–4.500)

## 2020-10-09 LAB — CK: Total CK: 219 U/L (ref 38–234)

## 2020-10-09 LAB — MAGNESIUM: Magnesium: 2 mg/dL (ref 1.7–2.4)

## 2020-10-09 MED ORDER — ACETAMINOPHEN 325 MG PO TABS
650.0000 mg | ORAL_TABLET | Freq: Four times a day (QID) | ORAL | Status: DC | PRN
  Administered 2020-10-11 – 2020-10-12 (×2): 650 mg via ORAL
  Filled 2020-10-09 (×2): qty 2

## 2020-10-09 MED ORDER — ONDANSETRON HCL 4 MG/2ML IJ SOLN
4.0000 mg | Freq: Four times a day (QID) | INTRAMUSCULAR | Status: DC | PRN
  Administered 2020-10-09: 4 mg via INTRAVENOUS
  Filled 2020-10-09: qty 2

## 2020-10-09 MED ORDER — TRANEXAMIC ACID-NACL 1000-0.7 MG/100ML-% IV SOLN
1000.0000 mg | INTRAVENOUS | Status: AC
  Administered 2020-10-10: 1000 mg via INTRAVENOUS
  Filled 2020-10-09: qty 100

## 2020-10-09 MED ORDER — METOCLOPRAMIDE HCL 5 MG/ML IJ SOLN
10.0000 mg | Freq: Once | INTRAMUSCULAR | Status: AC
  Administered 2020-10-09: 10 mg via INTRAVENOUS
  Filled 2020-10-09: qty 2

## 2020-10-09 MED ORDER — FENTANYL CITRATE (PF) 100 MCG/2ML IJ SOLN
50.0000 ug | INTRAMUSCULAR | Status: DC | PRN
  Administered 2020-10-09 (×2): 50 ug via INTRAVENOUS
  Filled 2020-10-09 (×2): qty 2

## 2020-10-09 MED ORDER — CEFAZOLIN SODIUM-DEXTROSE 2-4 GM/100ML-% IV SOLN
2.0000 g | INTRAVENOUS | Status: AC
  Administered 2020-10-10: 2 g via INTRAVENOUS

## 2020-10-09 MED ORDER — ONDANSETRON HCL 4 MG PO TABS: 4.0000 mg | ORAL_TABLET | Freq: Four times a day (QID) | ORAL | Status: DC | PRN

## 2020-10-09 MED ORDER — SODIUM CHLORIDE 0.9 % IV BOLUS
500.0000 mL | Freq: Once | INTRAVENOUS | Status: AC
  Administered 2020-10-09: 500 mL via INTRAVENOUS

## 2020-10-09 MED ORDER — SENNOSIDES-DOCUSATE SODIUM 8.6-50 MG PO TABS: 1.0000 | ORAL_TABLET | Freq: Every evening | ORAL | Status: DC | PRN

## 2020-10-09 MED ORDER — AMIODARONE HCL 200 MG PO TABS
100.0000 mg | ORAL_TABLET | Freq: Every day | ORAL | Status: DC
  Administered 2020-10-10 – 2020-10-12 (×3): 100 mg via ORAL
  Filled 2020-10-09 (×3): qty 1

## 2020-10-09 MED ORDER — ATORVASTATIN CALCIUM 40 MG PO TABS
80.0000 mg | ORAL_TABLET | Freq: Every day | ORAL | Status: DC
Start: 2020-10-09 — End: 2020-10-12
  Administered 2020-10-10 – 2020-10-11 (×2): 80 mg via ORAL
  Filled 2020-10-09 (×2): qty 2

## 2020-10-09 MED ORDER — SODIUM CHLORIDE 0.9 % IV SOLN: INTRAVENOUS | Status: AC

## 2020-10-09 MED ORDER — IPRATROPIUM BROMIDE 0.02 % IN SOLN: 0.5000 mg | Freq: Four times a day (QID) | RESPIRATORY_TRACT | Status: DC | PRN

## 2020-10-09 MED ORDER — HYDROMORPHONE HCL 1 MG/ML IJ SOLN
0.5000 mg | INTRAMUSCULAR | Status: DC | PRN
  Administered 2020-10-09 – 2020-10-11 (×4): 1 mg via INTRAVENOUS
  Administered 2020-10-11: 0.5 mg via INTRAVENOUS
  Administered 2020-10-11: 1 mg via INTRAVENOUS
  Filled 2020-10-09: qty 1
  Filled 2020-10-09: qty 0.5
  Filled 2020-10-09 (×4): qty 1

## 2020-10-09 MED ORDER — OXYCODONE HCL 5 MG PO TABS
5.0000 mg | ORAL_TABLET | ORAL | Status: DC | PRN
  Administered 2020-10-11 – 2020-10-12 (×3): 5 mg via ORAL
  Filled 2020-10-09 (×4): qty 1

## 2020-10-09 MED ORDER — TRAZODONE HCL 50 MG PO TABS
25.0000 mg | ORAL_TABLET | Freq: Every evening | ORAL | Status: DC | PRN
  Administered 2020-10-09 – 2020-10-12 (×2): 25 mg via ORAL
  Filled 2020-10-09 (×2): qty 1

## 2020-10-09 MED ORDER — SODIUM CHLORIDE 0.9% FLUSH: 3.0000 mL | Freq: Two times a day (BID) | INTRAVENOUS | Status: DC

## 2020-10-09 MED ORDER — MAGNESIUM CITRATE PO SOLN
1.0000 | Freq: Once | ORAL | Status: DC | PRN
Start: 2020-10-09 — End: 2020-10-12

## 2020-10-09 MED ORDER — SODIUM CHLORIDE 0.9% FLUSH
3.0000 mL | Freq: Two times a day (BID) | INTRAVENOUS | Status: DC
  Administered 2020-10-10 – 2020-10-12 (×5): 3 mL via INTRAVENOUS

## 2020-10-09 MED ORDER — METOPROLOL TARTRATE 25 MG PO TABS: 12.5000 mg | ORAL_TABLET | Freq: Two times a day (BID) | ORAL | Status: DC

## 2020-10-09 MED ORDER — LEVETIRACETAM 500 MG PO TABS
500.0000 mg | ORAL_TABLET | Freq: Two times a day (BID) | ORAL | Status: DC
  Administered 2020-10-09 – 2020-10-12 (×6): 500 mg via ORAL
  Filled 2020-10-09 (×6): qty 1

## 2020-10-09 MED ORDER — MUPIROCIN 2 % EX OINT: 1.0000 "application " | TOPICAL_OINTMENT | Freq: Two times a day (BID) | CUTANEOUS | Status: DC

## 2020-10-09 MED ORDER — ACETAMINOPHEN 650 MG RE SUPP: 650.0000 mg | Freq: Four times a day (QID) | RECTAL | Status: DC | PRN

## 2020-10-09 MED ORDER — SODIUM CHLORIDE 0.9% FLUSH: 3.0000 mL | INTRAVENOUS | Status: DC | PRN

## 2020-10-09 MED ORDER — BISACODYL 5 MG PO TBEC: 5.0000 mg | DELAYED_RELEASE_TABLET | Freq: Every day | ORAL | Status: DC | PRN

## 2020-10-09 MED ORDER — SODIUM CHLORIDE 0.9 % IV SOLN: 250.0000 mL | INTRAVENOUS | Status: DC | PRN

## 2020-10-09 MED ORDER — INSULIN ASPART 100 UNIT/ML IJ SOLN
0.0000 [IU] | Freq: Three times a day (TID) | INTRAMUSCULAR | Status: DC
  Administered 2020-10-11: 2 [IU] via SUBCUTANEOUS
  Administered 2020-10-11: 1 [IU] via SUBCUTANEOUS

## 2020-10-09 MED ORDER — LEVALBUTEROL HCL 0.63 MG/3ML IN NEBU: 0.6300 mg | INHALATION_SOLUTION | Freq: Four times a day (QID) | RESPIRATORY_TRACT | Status: DC | PRN

## 2020-10-09 MED ORDER — POTASSIUM CHLORIDE CRYS ER 20 MEQ PO TBCR
40.0000 meq | EXTENDED_RELEASE_TABLET | Freq: Once | ORAL | Status: AC
Start: 2020-10-09 — End: 2020-10-11
  Administered 2020-10-11: 40 meq via ORAL
  Filled 2020-10-09: qty 2

## 2020-10-09 MED ORDER — HYDRALAZINE HCL 20 MG/ML IJ SOLN
10.0000 mg | INTRAMUSCULAR | Status: DC | PRN
  Administered 2020-10-09: 10 mg via INTRAVENOUS
  Filled 2020-10-09: qty 1

## 2020-10-09 NOTE — ED Provider Notes (Signed)
[facility] EMERGENCY DEPARTMENT Provider Note   CSN: [id number] Arrival date & time: 10/09/20  [id number]     History Chief Complaint  Patient presents with   Rachel Fox    Rachel Fox is a 75 y.o. female.  Patient with history of atrial fibrillation on anticoagulant, stroke, cholesterol, blood pressure, seizures presents with left hip swelling and bruising.  Patient was at home 2 days ago and fell while trying to reach for her walker.  No head injury or neck pain.  Patient hit left hip and also has right knee pain.  Patient has not build to walk since then.  Patient has history of pacemaker as well.  Patient denies syncope.  Pain with any range of motion.  No fevers or chills.  Patient lives at home with son.      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   Pressure injury of skin 09/21/2018   Cerebrovascular accident (CVA) with involvement of left side of body (HCC) 09/20/2018   Atrial fibrillation with RVR (HCC) 09/20/2018   Hypertension    Rhabdomyolysis    Type 2 diabetes mellitus (HCC)    Hyperlipidemia    Hyperbilirubinemia    Leukocytosis    Hypernatremia     Past Surgical History:  Procedure Laterality Date   ABDOMINAL HYSTERECTOMY     PACEMAKER IMPLANT N/A 08/22/2020   Procedure: PACEMAKER IMPLANT;  Surgeon: Marinus Mawaylor, Gregg W, MD;  Location: [facility];  Service:  Cardiovascular;  Laterality: N/A;   TUBAL LIGATION       OB History   No obstetric history on file.     Family History  Problem Relation Age of Onset   Stroke Mother    Heart attack Father    Stroke Sister    Heart attack Sister     Social History   Tobacco Use   Smoking status: Former    Types: Cigarettes    Quit date: 04/14/1988    Years since quitting: 32.5   Smokeless tobacco: Never  Vaping Use   Vaping Use: Never used  Substance Use Topics   Alcohol use: Never   Drug use: Never    Home Medications Prior to Admission medications   Medication Sig Start Date End Date Taking? Authorizing Provider  acetaminophen (TYLENOL) 500 MG tablet Take 1,000 mg by mouth every 6 (six) hours as needed for moderate pain or headache.    [provider]  amiodarone (PACERONE) 100 MG tablet Take 1 tablet (100 mg total) by mouth daily. 05/17/20   Leone BrandIngold, Laura R, NP  apixaban (ELIQUIS) 5 MG TABS tablet Take 1 tablet (5 mg total) by mouth 2 (two) times daily. 12/01/18 11/26/19  Strader, Lennart PallBrittany M, PA-C  atorvastatin (LIPITOR) 80 MG tablet Take 1 tablet (80 mg total) by mouth daily  at 6 PM. 09/27/18 09/08/19  Dimple Nanas, MD  levETIRAcetam (KEPPRA) 500 MG tablet Take 500 mg by mouth 2 (two) times daily.    [provider]  losartan (COZAAR) 25 MG tablet Take 1 tablet (25 mg total) by mouth daily. 06/14/20 09/12/20  Ellsworth Lennox, PA-C    Allergies    Lactose intolerance (gi)  Review of Systems   Review of Systems  Constitutional:  Negative for chills and fever.  HENT:  Negative for congestion.   Eyes:  Negative for visual disturbance.  Respiratory:  Negative for shortness of breath.   Cardiovascular:  Negative for chest pain.  Gastrointestinal:  Negative for abdominal pain and vomiting.  Genitourinary:  Negative for dysuria and flank pain.  Musculoskeletal:  Positive for gait problem. Negative for back pain, neck pain and neck stiffness.  Skin:  Positive for  color change. Negative for rash.  Neurological:  Positive for weakness. Negative for light-headedness and headaches.   Physical Exam Updated Vital Signs BP (!) 160/78   Pulse 63   Temp 98.8 F (37.1 C) (Oral)   Resp 16   Ht 5\' 3"  (1.6 m)   Wt 55.8 kg   SpO2 96%   BMI 21.79 kg/m   Physical Exam Vitals and nursing note reviewed.  Constitutional:      General: She is not in acute distress.    Appearance: She is well-developed.  HENT:     Head: Normocephalic and atraumatic.     Nose: No congestion.     Mouth/Throat:     Mouth: Mucous membranes are moist.  Eyes:     General:        Right eye: No discharge.        Left eye: No discharge.     Conjunctiva/sclera: Conjunctivae normal.  Neck:     Trachea: No tracheal deviation.  Cardiovascular:     Rate and Rhythm: Normal rate and regular rhythm.     Heart sounds: No murmur heard. Pulmonary:     Effort: Pulmonary effort is normal.     Breath sounds: Normal breath sounds.  Abdominal:     General: There is no distension.     Palpations: Abdomen is soft.     Tenderness: There is no abdominal tenderness. There is no guarding.  Musculoskeletal:        General: Swelling, tenderness and signs of injury present.     Cervical back: Normal range of motion and neck supple. No rigidity.     Comments: Patient has no midline spinal tenderness thoracic or lumbar region.  Full range of motion head neck without pain.  Patient has moderate tenderness swelling ecchymosis left lateral thigh and hip region.  Difficulty with flexion or movement of the left thigh and leg due to pain.  Patient has no tenderness to right hip, mild tenderness anterior right knee with no significant effusion.  No tenderness to ankles or feet bilateral.  Patient has no tenderness to upper extremities.  Skin:    General: Skin is warm.     Capillary Refill: Capillary refill takes less than 2 seconds.     Findings: Erythema: ecchymosis left hip region.  Neurological:      General: No focal deficit present.     Mental Status: She is alert.     Cranial Nerves: No cranial nerve deficit.  Psychiatric:        Mood and Affect: Mood normal.    ED Results / Procedures / Treatments  Labs (all labs ordered are listed, but only abnormal results are displayed) Labs Reviewed  URINALYSIS, ROUTINE W REFLEX MICROSCOPIC - Abnormal; Notable for the following components:      Result Value   APPearance HAZY (*)    Ketones, ur 20 (*)    All other components within normal limits  COMPREHENSIVE METABOLIC PANEL - Abnormal; Notable for the following components:   Potassium 3.0 (*)    Glucose, Bld 112 (*)    All other components within normal limits  RESP PANEL BY RT-PCR (FLU A&B, COVID) ARPGX2  CBC WITH DIFFERENTIAL/PLATELET  CK  MAGNESIUM  CBG MONITORING, ED    EKG None  Radiology DG Chest 1 View  Result Date: 10/09/2020 CLINICAL DATA:  [place]. Left hip fracture. EXAM: CHEST  1 VIEW COMPARISON:  08/22/2020 FINDINGS: The pacer wires are in good position, unchanged. The cardiac silhouette, mediastinal and hilar contours are normal. The lungs are clear. The bony thorax is intact. IMPRESSION: No acute cardiopulmonary findings. Electronically Signed   By: Rudie Meyer M.D.   On: 10/09/2020 12:50   CT PELVIS WO CONTRAST  Result Date: 10/09/2020 CLINICAL DATA:  [place]. Left hip pain. EXAM: CT PELVIS WITHOUT CONTRAST TECHNIQUE: Multidetector CT imaging of the pelvis was performed following the standard protocol without intravenous contrast. COMPARISON:  None. FINDINGS: Urinary Tract:  The bladder is unremarkable. Bowel: The rectum, sigmoid colon and visualized small bowel loops are unremarkable. Vascular/Lymphatic: Vascular calcifications but no aneurysm. No adenopathy. Reproductive:  Surgically absent. Other:  No free pelvic fluid or pelvic hematoma. Musculoskeletal: As demonstrated on the radiographs there is a mildly displaced left femoral neck fracture. Mild impaction and  varus deformity. The right hip is intact. The pubic symphysis and SI joints are intact. No pelvic fractures or bone lesions. Degenerative changes at the pubic symphysis and chondrocalcinosis are noted. There is a subcutaneous contusion/hematoma overlying the left hip area. No significant intramuscular hematoma. IMPRESSION: 1. Mildly displaced left femoral neck fracture. 2. No pelvic fractures or bone lesions. 3. Subcutaneous contusion/hematoma overlying the left hip area. Electronically Signed   By: Rudie Meyer M.D.   On: 10/09/2020 12:53   DG Knee Complete 4 Views Right  Result Date: 10/09/2020 CLINICAL DATA:  [place]. Right knee pain. EXAM: RIGHT KNEE - COMPLETE 4+ VIEW COMPARISON:  None. FINDINGS: Mild degenerative changes are noted with early joint space narrowing and spurring. There is also chondrocalcinosis. No acute fracture or osteochondral lesion. No joint effusion. IMPRESSION: 1. Mild degenerative changes and chondrocalcinosis. 2. No acute bony findings or joint effusion. Electronically Signed   By: Rudie Meyer M.D.   On: 10/09/2020 12:48   DG Femur Min 2 Views Left  Result Date: 10/09/2020 CLINICAL DATA:  [place]. Left hip pain. EXAM: LEFT FEMUR 2 VIEWS COMPARISON:  None. FINDINGS: There is a mildly displaced femoral neck fracture and mild varus deformity. No femoral shaft fracture. IMPRESSION: Mildly displaced femoral neck fracture. Electronically Signed   By: Rudie Meyer M.D.   On: 10/09/2020 12:50    Procedures Procedures   Medications Ordered in ED Medications  fentaNYL (SUBLIMAZE) injection 50 mcg (50 mcg Intravenous Given 10/09/20 1145)  potassium chloride SA (KLOR-CON) CR tablet 40 mEq (has no administration in time range)  sodium chloride 0.9 % bolus 500 mL (0 mLs Intravenous Stopped 10/09/20 1308)    ED Course  I have reviewed the triage vital signs and the nursing notes.  Pertinent labs & imaging results that were available during my care of the patient  were reviewed by me  and considered in my medical decision making (see chart for details).    MDM Rules/Calculators/A&P                           Patient presents with assessment after fall 2 days ago.  Patient explained her son recommended she be evaluated however she refused and felt she would be better on her own.  Patient is not billed put any weight on that leg since.  Plan for general blood work, CK due to 2 days of lying in bed, IV fluid bolus.  Urinalysis and EKG pending.  CT scan and x-rays pending.  Pain meds ordered. Blood work reviewed potassium 3.0, potassium ordered.  Hemoglobin white count normal.  Magnesium added. CT scan reviewed showing left mild displaced left femoral neck fracture, x-ray of the right knee no acute fracture.  Paged orthopedics and hospitalist for admission.  Patient received pain meds.   Final Clinical Impression(s) / ED Diagnoses Final diagnoses:  Hematoma of left thigh, initial encounter  Fall, initial encounter  Contusion of right knee, initial encounter  Closed displaced fracture of left femoral neck (HCC)  Hypokalemia    Rx / DC Orders ED Discharge Orders     None        Blane Ohara, MD 10/09/20 1427

## 2020-10-09 NOTE — ED Triage Notes (Signed)
Pt here from home by [facility] who reports pt had a fall from a sitting position x 2 days ago and is c/o left hip and leg pain, right knee pain since the fall with inability to walk; pt hx blood thinners BID and pacemaker

## 2020-10-09 NOTE — Consult Note (Signed)
ORTHOPAEDIC CONSULTATION  REQUESTING PHYSICIAN: Elnora Morrison, MD  ASSESSMENT AND PLAN: 75 y.o. female with the following: Left Hip Displaced femoral neck fracture  This patient requires inpatient admission to the hospitalist, to include preoperative clearance and perioperative medical management  - Weight Bearing Status/Activity: NWB Left lower extremity  - Additional recommended labs/tests: Preop Labs: CBC, BMP, PT/INR, Chest XR, and EKG  -VTE Prophylaxis: Please hold prior to OR; to resume POD#1 at the discretion of the primary team  - Pain control: Recommend PO pain medications PRN; judicious use of narcotics  - Follow-up plan: F/u 10-14 days postop  -Procedures: Plan for OR once patient has been medically optimized  Plan for Left Hip hemiarthroplasty  Patient is on chronic Eliquis therapy after a stroke, as well as a history of A. fib.  Her last dose was earlier this morning.  Surgery may be delayed based on her last dose of Eliquis.  If medically cleared, plan to proceed to surgery tomorrow afternoon.  Otherwise, we will plan for surgery on Friday, July 29.     Chief Complaint: Left hip pain  HPI: Rachel Fox is a 75 y.o. female who presented to the ED for evaluation after sustaining a mechanical fall.  At baseline, she uses a walker to assist with ambulation.  She has limited use of the left side following a stroke 2 years ago.  The fall happened a couple of days ago.  She did not note pain in her left hip immediately.  However, she has limited ability to ambulate.  After couple days of persistent pain, and inability to ambulate, the patient's son ultimately insisted that she present to the emergency department for evaluation.  Her pain is controlled when she is laying still.  Of note, she states that she just recently finished an extended course of physical therapy and recovery from her stroke.  Her last dose of Eliquis was early this morning, at 6 AM or  earlier.  Past Medical History:  Diagnosis Date   Atrial fibrillation ([place])    CVA (cerebral vascular accident) ([place])    High cholesterol    Hypertension    Seizures ([place])    Type 2 diabetes mellitus ([place])    Past Surgical History:  Procedure Laterality Date   ABDOMINAL HYSTERECTOMY     PACEMAKER IMPLANT N/A 08/22/2020   Procedure: PACEMAKER IMPLANT;  Surgeon: Evans Lance, MD;  Location: [facility];  Service: Cardiovascular;  Laterality: N/A;   TUBAL LIGATION     Social History   Socioeconomic History   Marital status: Divorced    Spouse name: Not on file   Number of children: Not on file   Years of education: Not on file   Highest education level: Not on file  Occupational History   Not on file  Tobacco Use   Smoking status: Former    Types: Cigarettes    Quit date: 04/14/1988    Years since quitting: 32.5   Smokeless tobacco: Never  Vaping Use   Vaping Use: Never used  Substance and Sexual Activity   Alcohol use: Never   Drug use: Never   Sexual activity: Not on file  Other Topics Concern   Not on file  Social History Narrative   Not on file   Social Determinants of Health   Financial Resource Strain: Not on file  Food Insecurity: Not on file  Transportation Needs: Not on file  Physical Activity: Not on file  Stress: Not on  file  Social Connections: Not on file   Family History  Problem Relation Age of Onset   Stroke Mother    Heart attack Father    Stroke Sister    Heart attack Sister    Allergies  Allergen Reactions   Lactose Intolerance (Gi) Other (See Comments)    Causes Gas in patient.   Prior to Admission medications   Medication Sig Start Date End Date Taking? Authorizing Provider  acetaminophen (TYLENOL) 500 MG tablet Take 1,000 mg by mouth every 6 (six) hours as needed for moderate pain or headache.   Yes [provider]  amiodarone (PACERONE) 100 MG tablet Take 1 tablet (100 mg total) by mouth daily. 05/17/20  Yes  Isaiah Serge, NP  apixaban (ELIQUIS) 5 MG TABS tablet Take 1 tablet (5 mg total) by mouth 2 (two) times daily. 12/01/18 10/09/20 Yes Strader, Fransisco Hertz, PA-C  atorvastatin (LIPITOR) 80 MG tablet Take 1 tablet (80 mg total) by mouth daily at 6 PM. 09/27/18 10/09/20 Yes Amin, Jeanella Flattery, MD  BOTOX 100 units SOLR injection Inject 100 Units into the muscle every 3 (three) months. 09/19/20  Yes [provider]  levETIRAcetam (KEPPRA) 500 MG tablet Take 500 mg by mouth 2 (two) times daily.   Yes [provider]  losartan (COZAAR) 25 MG tablet Take 1 tablet (25 mg total) by mouth daily. 06/14/20 10/09/20 Yes Strader, Fransisco Hertz, PA-C   DG Chest 1 View  Result Date: 10/09/2020 CLINICAL DATA:  [place]. Left hip fracture. EXAM: CHEST  1 VIEW COMPARISON:  08/22/2020 FINDINGS: The pacer wires are in good position, unchanged. The cardiac silhouette, mediastinal and hilar contours are normal. The lungs are clear. The bony thorax is intact. IMPRESSION: No acute cardiopulmonary findings. Electronically Signed   By: Marijo Sanes M.D.   On: 10/09/2020 12:50   CT PELVIS WO CONTRAST  Result Date: 10/09/2020 CLINICAL DATA:  [place]. Left hip pain. EXAM: CT PELVIS WITHOUT CONTRAST TECHNIQUE: Multidetector CT imaging of the pelvis was performed following the standard protocol without intravenous contrast. COMPARISON:  None. FINDINGS: Urinary Tract:  The bladder is unremarkable. Bowel: The rectum, sigmoid colon and visualized small bowel loops are unremarkable. Vascular/Lymphatic: Vascular calcifications but no aneurysm. No adenopathy. Reproductive:  Surgically absent. Other:  No free pelvic fluid or pelvic hematoma. Musculoskeletal: As demonstrated on the radiographs there is a mildly displaced left femoral neck fracture. Mild impaction and varus deformity. The right hip is intact. The pubic symphysis and SI joints are intact. No pelvic fractures or bone lesions. Degenerative changes at the pubic symphysis and  chondrocalcinosis are noted. There is a subcutaneous contusion/hematoma overlying the left hip area. No significant intramuscular hematoma. IMPRESSION: 1. Mildly displaced left femoral neck fracture. 2. No pelvic fractures or bone lesions. 3. Subcutaneous contusion/hematoma overlying the left hip area. Electronically Signed   By: Marijo Sanes M.D.   On: 10/09/2020 12:53   DG Knee Complete 4 Views Right  Result Date: 10/09/2020 CLINICAL DATA:  [place]. Right knee pain. EXAM: RIGHT KNEE - COMPLETE 4+ VIEW COMPARISON:  None. FINDINGS: Mild degenerative changes are noted with early joint space narrowing and spurring. There is also chondrocalcinosis. No acute fracture or osteochondral lesion. No joint effusion. IMPRESSION: 1. Mild degenerative changes and chondrocalcinosis. 2. No acute bony findings or joint effusion. Electronically Signed   By: Marijo Sanes M.D.   On: 10/09/2020 12:48   DG Femur Min 2 Views Left  Result Date: 10/09/2020 CLINICAL DATA:  [place]. Left hip  pain. EXAM: LEFT FEMUR 2 VIEWS COMPARISON:  None. FINDINGS: There is a mildly displaced femoral neck fracture and mild varus deformity. No femoral shaft fracture. IMPRESSION: Mildly displaced femoral neck fracture. Electronically Signed   By: Marijo Sanes M.D.   On: 10/09/2020 12:50   Family History Reviewed and non-contributory, no pertinent history of problems with bleeding or anesthesia    Review of Systems No fevers or chills No numbness or tingling No chest pain No shortness of breath No bowel or bladder dysfunction No GI distress No headaches    OBJECTIVE  Vitals:Patient Vitals for the past 8 hrs:  BP Temp Temp src Pulse Resp SpO2 Height Weight  10/09/20 1400 (!) 160/78 -- -- 63 16 96 % -- --  10/09/20 1300 (!) 161/84 -- -- 68 16 97 % -- --  10/09/20 1230 (!) 157/92 -- -- 81 16 97 % -- --  10/09/20 1131 (!) 177/93 -- -- 91 18 99 % -- --  10/09/20 1124 (!) 170/96 -- -- 95 13 99 % -- --  10/09/20 0939 -- -- -- -- -- --  5' 3" (1.6 m) 55.8 kg  10/09/20 0937 (!) 167/101 98.8 F (37.1 C) Oral (!) 141 16 98 % -- --   General: Alert, no acute distress Cardiovascular: Extremities are warm Respiratory: No cyanosis, no use of accessory musculature Skin: Ecchymosis over the posterior lateral aspect of her left hip. Neurologic: Sensation intact distally, but she has limited function of the left ankle and great toe. Psychiatric: Patient is competent for consent with normal mood and affect Lymphatic: No swelling obvious and reported other than the area involved in the exam below Extremities  LLE: Extremity held in a fixed position.  ROM deferred due to known fracture.  Sensation is intact distally in the sural, saphenous, DP, SP, and plantar nerve distribution. 2+ DP pulse.  Toes are WWP.  Limited active dorsiflexion of the ankle and her great toe, but she does dorsiflex and response to deep palpation.   RLE: Sensation is intact distally in the sural, saphenous, DP, SP, and plantar nerve distribution. 2+ DP pulse.  Toes are WWP.  Active motion intact in the TA/EHL/GS. Tolerates gentle ROM of the hip.  No pain with axial loading.     Test Results Imaging XR of the Left hip demonstrates a Displaced femoral neck fracture.  Labs cbc Recent Labs    10/09/20 1133  WBC 8.4  HGB 13.5  HCT 41.6  PLT 169    Labs inflam No results for input(s): CRP in the last 72 hours.  Invalid input(s): ESR  Labs coag No results for input(s): INR, PTT in the last 72 hours.  Invalid input(s): PT  Recent Labs    10/09/20 1133  NA 140  K 3.0*  CL 102  CO2 29  GLUCOSE 112*  BUN 14  CREATININE 0.75  CALCIUM 9.7

## 2020-10-09 NOTE — H&P (Addendum)
History and Physical   Patient: Rachel Fox                            PCP: Benita Stabile, MD                    DOB: 07/24/1945            DOA: 10/09/2020 [id number]:[id number]             DOS: 10/09/2020, 3:13 PM  Patient coming from:   HOME  I have personally reviewed patient's medical records, in electronic medical records, including:  [facility], and care everywhere.    Chief Complaint:   Chief Complaint  Patient presents with   Fall    History of present illness:    Rachel Fox is a 75 y.o. female with medical history significant of A. fib, on Eliquis, HTN, HLD, CVA with left-sided weakness, left arm contracture, seizures, diabetes mellitus type II, presenting with hip pain, status post fall 2 days ago. Reports reaching out for walker, was accidental fall onto her left side on her hip. Denies of hitting her head, or passing out..    Patient Denies having: Fever, Chills, Cough, SOB, Chest Pain, Abd pain, N/V/D, headache, dizziness, lightheadedness,  Dysuria, Joint pain, rash, open wounds  ED Course:   Blood pressure (!) 162/85, pulse 74, temperature 98.8 F (37.1 C), temperature source Oral, resp. rate 17, height 5\' 3"  (1.6 m), weight 55.8 kg, SpO2 100 %.  Abnormal labs; CBC CMP within normal notes exception of potassium 3.0, glucose 112 CT pelvis without contrast - 1. Mildly displaced left femoral neck fracture. 2. No pelvic fractures or bone lesions. 3. Subcutaneous contusion/hematoma overlying the left hip area.  Right knee x-ray-degenerative changes, negative any fractures or effusion Left knee femur x-ray-  Mildly displaced femoral neck fracture.     Assessment / Plan:   Principal Problem:    Left Hip fx (HCC) -Left hip fracture-CT pelvis without contrast reviewed revealing nondisplaced left femoral neck fracture, pelvic fracture, subcutaneous hematoma left hip area -Patient on Eliquis due to chronic A. Fib -We will admit for close  monitoring -Withholding Eliquis last dose this morning 10/09/2020 -according to the patient -Orthopedic team consulted, anticipating surgical intervention in next 48 hours Awaiting Eliquis washout - Patient remains at moderate risk for surgery, cardiology will be consulted for further evaluation and clearance   Active Problems:    Cerebrovascular accident (CVA) with involvement of left side of body (HCC) -with left-sided weakness, left upper extremity contracture -On Eliquis, statins, -Request due to hip fracture, hematoma -Avoiding anticoagulation due to left hip hematoma, will monitor H&H post    Hypertension -as needed hydralazine, holding lisinopril, anticipating adding beta-blocker    Type 2 diabetes mellitus (HCC)  -CBG QA CHS, SSI coverage -Checking hemoglobin A1c    Hyperlipidemia -continue Lipitor      Atrial fibrillation (HCC)  -continue home medication of amiodarone, anticipating adding beta-blockers -Withholding Eliquis awaiting washout for orthopedic surgery -Withholding oral anticoagulation due to left hip pain with hematoma monitoring H&H  Sinus node dysfunction (HCC) -on pacemaker, on amiodarone  -Consult cardiology for surgical clearance   Cultures:  -none  Antimicrobial: -None  Consults called: Orthopedic team Dr.Craine , cardiology -------------------------------------------------------------------------------------------------------------------------------------------- DVT prophylaxis: SCD/Compression stockings holding home medication of Eliquis Code Status:   Code Status: Full Code   Admission status: Patient will be admitted as Inpatient, with a greater  than 2 midnight length of stay. Level of care: Telemetry   Family Communication:  none at bedside  (The above findings and plan of care has been discussed with patient in detail, the patient expressed understanding and agreement of above plan)   -------------------------------------------------------------------------------------------------------------------------------------------------  Disposition Plan: >3 days Status is: Inpatient  Remains inpatient appropriate because:Inpatient level of care appropriate due to severity of illness  Dispo: The patient is from: Home              Anticipated d/c is to: SNF in 3-5 days               Patient currently is not medically stable to d/c.   Difficult to place patient No         Review of Systems: As per HPI, otherwise 10 point review of systems were negative.   ----------------------------------------------------------------------------------------------------------------------  Allergies  Allergen Reactions   Lactose Intolerance (Gi) Other (See Comments)    Causes Gas in patient.    Home MEDs:  Prior to Admission medications   Medication Sig Start Date End Date Taking? Authorizing Provider  acetaminophen (TYLENOL) 500 MG tablet Take 1,000 mg by mouth every 6 (six) hours as needed for moderate pain or headache.   Yes [provider]  amiodarone (PACERONE) 100 MG tablet Take 1 tablet (100 mg total) by mouth daily. 05/17/20  Yes Leone Brand, NP  apixaban (ELIQUIS) 5 MG TABS tablet Take 1 tablet (5 mg total) by mouth 2 (two) times daily. 12/01/18 10/09/20 Yes Strader, Lennart Pall, PA-C  atorvastatin (LIPITOR) 80 MG tablet Take 1 tablet (80 mg total) by mouth daily at 6 PM. 09/27/18 10/09/20 Yes Amin, Loura Halt, MD  BOTOX 100 units SOLR injection Inject 100 Units into the muscle every 3 (three) months. 09/19/20  Yes [provider]  levETIRAcetam (KEPPRA) 500 MG tablet Take 500 mg by mouth 2 (two) times daily.   Yes [provider]  losartan (COZAAR) 25 MG tablet Take 1 tablet (25 mg total) by mouth daily. 06/14/20 10/09/20 Yes Strader, Lennart Pall, PA-C    PRN MEDs: sodium chloride, acetaminophen **OR** acetaminophen, bisacodyl, fentaNYL (SUBLIMAZE)  injection, hydrALAZINE, HYDROmorphone (DILAUDID) injection, ipratropium, levalbuterol, magnesium citrate, ondansetron **OR** ondansetron (ZOFRAN) IV, oxyCODONE, senna-docusate, sodium chloride flush, traZODone  Past Medical History:  Diagnosis Date   Atrial fibrillation (HCC)    CVA (cerebral vascular accident) (HCC)    High cholesterol    Hypertension    Seizures (HCC)    Type 2 diabetes mellitus (HCC)     Past Surgical History:  Procedure Laterality Date   ABDOMINAL HYSTERECTOMY     PACEMAKER IMPLANT N/A 08/22/2020   Procedure: PACEMAKER IMPLANT;  Surgeon: Marinus Maw, MD;  Location: [facility];  Service: Cardiovascular;  Laterality: N/A;   TUBAL LIGATION       reports that she quit smoking about 32 years ago. Her smoking use included cigarettes. She has never used smokeless tobacco. She reports that she does not drink alcohol and does not use drugs.   Family History  Problem Relation Age of Onset   Stroke Mother    Heart attack Father    Stroke Sister    Heart attack Sister     Physical Exam:   Vitals:   10/09/20 1300 10/09/20 1330 10/09/20 1400 10/09/20 1430  BP: (!) 161/84 (!) 160/85 (!) 160/78 (!) 162/85  Pulse: 68 67 63 74  Resp: 16 16 16 17   Temp:  TempSrc:      SpO2: 97% 97% 96% 100%  Weight:      Height:       Constitutional: NAD, calm, comfortable Eyes: PERRL, lids and conjunctivae normal ENMT: Mucous membranes are moist. Posterior pharynx clear of any exudate or lesions.Normal dentition.  Neck: normal, supple, no masses, no thyromegaly Respiratory: clear to auscultation bilaterally, no wheezing, no crackles. Normal respiratory effort. No accessory muscle use.  Cardiovascular: Regular rate and rhythm, no murmurs / rubs / gallops. No extremity edema. 2+ pedal pulses. No carotid bruits.  Abdomen: no tenderness, no masses palpated. No hepatosplenomegaly. Bowel sounds positive.  Musculoskeletal: Left hip pain, ecchymosis, limited range of motion  due to pain no clubbing / cyanosis.  Left-sided chronic weakness, left arm/hand contracture due to previous stroke  no contractures. Normal muscle tone.  Neurologic: Left-sided weakness, left arm contraction, sensation intact CN II-XII grossly intact. Sensation intact, DTR normal. Psychiatric: Normal judgment and insight. Alert and oriented x 3. Normal mood.  Skin: no rashes, lesions, ulcers. No induration Wounds: per nursing documentation         Labs on admission:    I have personally reviewed following labs and imaging studies  CBC: Recent Labs  Lab 10/09/20 1133  WBC 8.4  NEUTROABS 5.9  HGB 13.5  HCT 41.6  MCV 91.2  PLT 169   Basic Metabolic Panel: Recent Labs  Lab 10/09/20 1133  NA 140  K 3.0*  CL 102  CO2 29  GLUCOSE 112*  BUN 14  CREATININE 0.75  CALCIUM 9.7   GFR: Estimated Creatinine Clearance: 51.8 mL/min (by C-G formula based on SCr of 0.75 mg/dL). Liver Function Tests: Recent Labs  Lab 10/09/20 1133  AST 31  ALT 26  ALKPHOS 59  BILITOT 0.9  PROT 7.7  ALBUMIN 3.6   No results for input(s): LIPASE, AMYLASE in the last 168 hours. No results for input(s): AMMONIA in the last 168 hours. Coagulation Profile: No results for input(s): INR, PROTIME in the last 168 hours. Cardiac Enzymes: Recent Labs  Lab 10/09/20 1133  CKTOTAL 219   BNP (last 3 results) No results for input(s): PROBNP in the last 8760 hours. HbA1C: No results for input(s): HGBA1C in the last 72 hours. CBG: Recent Labs  Lab 10/09/20 0942  GLUCAP 93   Lipid Profile: No results for input(s): CHOL, HDL, LDLCALC, TRIG, CHOLHDL, LDLDIRECT in the last 72 hours. Thyroid Function Tests: No results for input(s): TSH, T4TOTAL, FREET4, T3FREE, THYROIDAB in the last 72 hours. Anemia Panel: No results for input(s): VITAMINB12, FOLATE, FERRITIN, TIBC, IRON, RETICCTPCT in the last 72 hours. Urine analysis:    Component Value Date/Time   COLORURINE YELLOW 10/09/2020 1049    APPEARANCEUR HAZY (A) 10/09/2020 1049   LABSPEC 1.019 10/09/2020 1049   PHURINE 6.0 10/09/2020 1049   GLUCOSEU NEGATIVE 10/09/2020 1049   HGBUR NEGATIVE 10/09/2020 1049   BILIRUBINUR NEGATIVE 10/09/2020 1049   KETONESUR 20 (A) 10/09/2020 1049   PROTEINUR NEGATIVE 10/09/2020 1049   NITRITE NEGATIVE 10/09/2020 1049   LEUKOCYTESUR NEGATIVE 10/09/2020 1049     Radiologic Exams on Admission:   DG Chest 1 View  Result Date: 10/09/2020 CLINICAL DATA:  Larey SeatFell. Left hip fracture. EXAM: CHEST  1 VIEW COMPARISON:  08/22/2020 FINDINGS: The pacer wires are in good position, unchanged. The cardiac silhouette, mediastinal and hilar contours are normal. The lungs are clear. The bony thorax is intact. IMPRESSION: No acute cardiopulmonary findings. Electronically Signed   By: Orlene PlumP.  Gallerani M.D.  On: 10/09/2020 12:50   CT PELVIS WO CONTRAST  Result Date: 10/09/2020 CLINICAL DATA:  Larey Seat. Left hip pain. EXAM: CT PELVIS WITHOUT CONTRAST TECHNIQUE: Multidetector CT imaging of the pelvis was performed following the standard protocol without intravenous contrast. COMPARISON:  None. FINDINGS: Urinary Tract:  The bladder is unremarkable. Bowel: The rectum, sigmoid colon and visualized small bowel loops are unremarkable. Vascular/Lymphatic: Vascular calcifications but no aneurysm. No adenopathy. Reproductive:  Surgically absent. Other:  No free pelvic fluid or pelvic hematoma. Musculoskeletal: As demonstrated on the radiographs there is a mildly displaced left femoral neck fracture. Mild impaction and varus deformity. The right hip is intact. The pubic symphysis and SI joints are intact. No pelvic fractures or bone lesions. Degenerative changes at the pubic symphysis and chondrocalcinosis are noted. There is a subcutaneous contusion/hematoma overlying the left hip area. No significant intramuscular hematoma. IMPRESSION: 1. Mildly displaced left femoral neck fracture. 2. No pelvic fractures or bone lesions. 3. Subcutaneous  contusion/hematoma overlying the left hip area. Electronically Signed   By: Rudie Meyer M.D.   On: 10/09/2020 12:53   DG Knee Complete 4 Views Right  Result Date: 10/09/2020 CLINICAL DATA:  Larey Seat. Right knee pain. EXAM: RIGHT KNEE - COMPLETE 4+ VIEW COMPARISON:  None. FINDINGS: Mild degenerative changes are noted with early joint space narrowing and spurring. There is also chondrocalcinosis. No acute fracture or osteochondral lesion. No joint effusion. IMPRESSION: 1. Mild degenerative changes and chondrocalcinosis. 2. No acute bony findings or joint effusion. Electronically Signed   By: Rudie Meyer M.D.   On: 10/09/2020 12:48   DG Femur Min 2 Views Left  Result Date: 10/09/2020 CLINICAL DATA:  Larey Seat. Left hip pain. EXAM: LEFT FEMUR 2 VIEWS COMPARISON:  None. FINDINGS: There is a mildly displaced femoral neck fracture and mild varus deformity. No femoral shaft fracture. IMPRESSION: Mildly displaced femoral neck fracture. Electronically Signed   By: Rudie Meyer M.D.   On: 10/09/2020 12:50    EKG:   Independently reviewed.  Orders placed or performed during the hospital encounter of 10/09/20   ED EKG   ED EKG   ED EKG   ED EKG   EKG 12-Lead   ---------------------------------------------------------------------------------------------------------------------------------------  ------------------------------------------------------------------------------------------------------------------------------------------------  Time spent: > than  55  Min.   SIGNED: Kendell Bane, MD, FHM. [facility],  Pager (Please use [email or web address] to Fox to text)  If 7PM-7AM, please contact night-coverage [email or web address],  10/09/2020, 3:13 PM

## 2020-10-09 NOTE — Plan of Care (Signed)
  Problem: Skin Integrity: Goal: Risk for impaired skin integrity will decrease Outcome: Progressing   Problem: Health Behavior/Discharge Planning: Goal: Ability to manage health-related needs will improve 10/09/2020 2347 by Lesly Rubenstein, RN Outcome: Progressing 10/09/2020 2346 by Lesly Rubenstein, RN Outcome: Progressing   Problem: Clinical Measurements: Goal: Will remain free from infection 10/09/2020 2347 by Lesly Rubenstein, RN Outcome: Progressing 10/09/2020 2346 by Lesly Rubenstein, RN Outcome: Progressing   Problem: Pain Managment: Goal: General experience of comfort will improve 10/09/2020 2347 by Lesly Rubenstein, RN Outcome: Progressing 10/09/2020 2346 by Lesly Rubenstein, RN Outcome: Progressing   Problem: Skin Integrity: Goal: Risk for impaired skin integrity will decrease 10/09/2020 2347 by Lesly Rubenstein, RN Outcome: Progressing 10/09/2020 2346 by Lesly Rubenstein, RN Outcome: Progressing   Problem: Education: Goal: Verbalization of understanding the information provided (i.e., activity precautions, restrictions, etc) will improve 10/09/2020 2347 by Lesly Rubenstein, RN Outcome: Progressing 10/09/2020 2346 by Lesly Rubenstein, RN Outcome: Progressing Goal: Individualized Educational Video(s) 10/09/2020 2347 by Lesly Rubenstein, RN Outcome: Progressing 10/09/2020 2346 by Lesly Rubenstein, RN Outcome: Progressing   Problem: Clinical Measurements: Goal: Postoperative complications will be avoided or minimized 10/09/2020 2347 by Lesly Rubenstein, RN Outcome: Progressing 10/09/2020 2346 by Lesly Rubenstein, RN Outcome: Progressing   Problem: Self-Concept: Goal: Ability to maintain and perform role responsibilities to the fullest extent possible will improve 10/09/2020 2347 by Lesly Rubenstein, RN Outcome: Progressing 10/09/2020 2346 by Lesly Rubenstein, RN Outcome: Progressing

## 2020-10-09 NOTE — ED Notes (Signed)
Pt has bruising on left and right hip.

## 2020-10-09 NOTE — ED Notes (Signed)
Update given to Nikko Quast (son) [phone].

## 2020-10-10 ENCOUNTER — Inpatient Hospital Stay (HOSPITAL_COMMUNITY): Payer: Medicare Other | Admitting: Certified Registered Nurse Anesthetist

## 2020-10-10 ENCOUNTER — Encounter (HOSPITAL_COMMUNITY): Payer: Self-pay | Admitting: Family Medicine

## 2020-10-10 ENCOUNTER — Inpatient Hospital Stay (HOSPITAL_COMMUNITY): Payer: Medicare Other

## 2020-10-10 ENCOUNTER — Encounter (HOSPITAL_COMMUNITY)
Admission: EM | Disposition: A | Discharge status: Skilled Nursing Facility | Payer: Self-pay | Source: Home / Self Care | Attending: Family Medicine

## 2020-10-10 ENCOUNTER — Telehealth: Payer: Self-pay | Admitting: Orthopedic Surgery

## 2020-10-10 DIAGNOSIS — E1165 Type 2 diabetes mellitus with hyperglycemia: Secondary | ICD-10-CM | POA: Diagnosis not present

## 2020-10-10 DIAGNOSIS — S72002A Fracture of unspecified part of neck of left femur, initial encounter for closed fracture: Secondary | ICD-10-CM | POA: Diagnosis not present

## 2020-10-10 DIAGNOSIS — W19XXXA Unspecified fall, initial encounter: Secondary | ICD-10-CM | POA: Diagnosis not present

## 2020-10-10 DIAGNOSIS — Z01810 Encounter for preprocedural cardiovascular examination: Secondary | ICD-10-CM

## 2020-10-10 DIAGNOSIS — I4891 Unspecified atrial fibrillation: Secondary | ICD-10-CM

## 2020-10-10 DIAGNOSIS — I639 Cerebral infarction, unspecified: Secondary | ICD-10-CM | POA: Diagnosis not present

## 2020-10-10 HISTORY — PX: HIP ARTHROPLASTY: SHX981

## 2020-10-10 LAB — CBC
HCT: 37.9 % (ref 36.0–46.0)
HCT: 33.7 % — ABNORMAL LOW (ref 36.0–46.0)
Hemoglobin: 11.5 g/dL — ABNORMAL LOW (ref 12.0–15.0)
Hemoglobin: 10 g/dL — ABNORMAL LOW (ref 12.0–15.0)
MCH: 28.9 pg (ref 26.0–34.0)
MCH: 28.8 pg (ref 26.0–34.0)
MCHC: 30.3 g/dL (ref 30.0–36.0)
MCHC: 29.7 g/dL — ABNORMAL LOW (ref 30.0–36.0)
MCV: 95.2 fL (ref 80.0–100.0)
MCV: 97.1 fL (ref 80.0–100.0)
Platelets: 164 10*3/uL (ref 150–400)
Platelets: 144 10*3/uL — ABNORMAL LOW (ref 150–400)
RBC: 3.98 MIL/uL (ref 3.87–5.11)
RBC: 3.47 MIL/uL — ABNORMAL LOW (ref 3.87–5.11)
RDW: 14.9 % (ref 11.5–15.5)
RDW: 14.9 % (ref 11.5–15.5)
WBC: 9.9 10*3/uL (ref 4.0–10.5)
WBC: 17.5 10*3/uL — ABNORMAL HIGH (ref 4.0–10.5)
nRBC: 0 % (ref 0.0–0.2)
nRBC: 0 % (ref 0.0–0.2)

## 2020-10-10 LAB — PROTIME-INR
INR: 1.2 (ref 0.8–1.2)
Prothrombin Time: 15.2 seconds (ref 11.4–15.2)

## 2020-10-10 LAB — BASIC METABOLIC PANEL
Anion gap: 12 (ref 5–15)
BUN: 16 mg/dL (ref 8–23)
CO2: 24 mmol/L (ref 22–32)
Calcium: 9.6 mg/dL (ref 8.9–10.3)
Chloride: 107 mmol/L (ref 98–111)
Creatinine, Ser: 0.8 mg/dL (ref 0.44–1.00)
GFR, Estimated: >60 mL/min (ref >60)
Glucose, Bld: 98 mg/dL (ref 70–99)
Potassium: 3.6 mmol/L (ref 3.5–5.1)
Sodium: 143 mmol/L (ref 135–145)

## 2020-10-10 LAB — GLUCOSE, CAPILLARY
Glucose-Capillary: 93 mg/dL (ref 70–99)
Glucose-Capillary: 74 mg/dL (ref 70–99)
Glucose-Capillary: 99 mg/dL (ref 70–99)
Glucose-Capillary: 157 mg/dL — ABNORMAL HIGH (ref 70–99)

## 2020-10-10 LAB — SURGICAL PCR SCREEN
MRSA, PCR: NEGATIVE
Staphylococcus aureus: NEGATIVE

## 2020-10-10 LAB — HEMOGLOBIN A1C
Hgb A1c MFr Bld: 5.6 % (ref 4.8–5.6)
Mean Plasma Glucose: 114 mg/dL

## 2020-10-10 SURGERY — HEMIARTHROPLASTY, HIP, DIRECT ANTERIOR APPROACH, FOR FRACTURE
Anesthesia: General | Site: Hip | Laterality: Left

## 2020-10-10 MED ORDER — 0.9 % SODIUM CHLORIDE (POUR BTL) OPTIME
TOPICAL | Status: DC | PRN
  Administered 2020-10-10: 1000 mL

## 2020-10-10 MED ORDER — LACTATED RINGERS IV SOLN: INTRAVENOUS | Status: DC | PRN

## 2020-10-10 MED ORDER — PROPOFOL 10 MG/ML IV BOLUS
INTRAVENOUS | Status: AC
  Filled 2020-10-10: qty 20

## 2020-10-10 MED ORDER — FENTANYL CITRATE (PF) 100 MCG/2ML IJ SOLN: 25.0000 ug | INTRAMUSCULAR | Status: DC | PRN

## 2020-10-10 MED ORDER — SODIUM CHLORIDE 0.9 % IR SOLN
Status: DC | PRN
  Administered 2020-10-10: 3000 mL

## 2020-10-10 MED ORDER — KETAMINE HCL 50 MG/5ML IJ SOSY
PREFILLED_SYRINGE | INTRAMUSCULAR | Status: AC
  Filled 2020-10-10: qty 5

## 2020-10-10 MED ORDER — DEXAMETHASONE SODIUM PHOSPHATE 10 MG/ML IJ SOLN
INTRAMUSCULAR | Status: AC
  Filled 2020-10-10: qty 1

## 2020-10-10 MED ORDER — KETAMINE HCL 10 MG/ML IJ SOLN
INTRAMUSCULAR | Status: DC | PRN
  Administered 2020-10-10 (×3): 10 mg via INTRAVENOUS
  Administered 2020-10-10: 20 mg via INTRAVENOUS

## 2020-10-10 MED ORDER — DEXAMETHASONE SODIUM PHOSPHATE 4 MG/ML IJ SOLN
INTRAMUSCULAR | Status: DC | PRN
  Administered 2020-10-10: 4 mg via INTRAVENOUS

## 2020-10-10 MED ORDER — BUPIVACAINE-EPINEPHRINE (PF) 0.5% -1:200000 IJ SOLN
INTRAMUSCULAR | Status: AC
  Filled 2020-10-10: qty 30

## 2020-10-10 MED ORDER — PROPOFOL 10 MG/ML IV BOLUS
INTRAVENOUS | Status: DC | PRN
  Administered 2020-10-10: 100 mg via INTRAVENOUS

## 2020-10-10 MED ORDER — VANCOMYCIN HCL 1 G IV SOLR
INTRAVENOUS | Status: DC | PRN
  Administered 2020-10-10: 1000 mg via TOPICAL

## 2020-10-10 MED ORDER — CEFAZOLIN SODIUM-DEXTROSE 2-4 GM/100ML-% IV SOLN
2.0000 g | Freq: Three times a day (TID) | INTRAVENOUS | Status: AC
Start: 2020-10-10 — End: 2020-10-11
  Administered 2020-10-10 – 2020-10-11 (×2): 2 g via INTRAVENOUS
  Filled 2020-10-10 (×3): qty 100

## 2020-10-10 MED ORDER — PHENYLEPHRINE 40 MCG/ML (10ML) SYRINGE FOR IV PUSH (FOR BLOOD PRESSURE SUPPORT)
PREFILLED_SYRINGE | INTRAVENOUS | Status: DC | PRN
  Administered 2020-10-10 (×6): 80 ug via INTRAVENOUS

## 2020-10-10 MED ORDER — GLYCOPYRROLATE PF 0.2 MG/ML IJ SOSY
PREFILLED_SYRINGE | INTRAMUSCULAR | Status: DC | PRN
  Administered 2020-10-10: .1 mg via INTRAVENOUS

## 2020-10-10 MED ORDER — BUPIVACAINE-EPINEPHRINE (PF) 0.5% -1:200000 IJ SOLN
INTRAMUSCULAR | Status: DC | PRN
  Administered 2020-10-10: 30 mL via PERINEURAL

## 2020-10-10 MED ORDER — GLYCOPYRROLATE PF 0.2 MG/ML IJ SOSY
PREFILLED_SYRINGE | INTRAMUSCULAR | Status: AC
  Filled 2020-10-10: qty 1

## 2020-10-10 MED ORDER — VANCOMYCIN HCL 1000 MG IV SOLR
INTRAVENOUS | Status: AC
  Filled 2020-10-10: qty 1000

## 2020-10-10 MED ORDER — FENTANYL CITRATE (PF) 100 MCG/2ML IJ SOLN
INTRAMUSCULAR | Status: DC | PRN
  Administered 2020-10-10 (×4): 50 ug via INTRAVENOUS

## 2020-10-10 MED ORDER — SUGAMMADEX SODIUM 200 MG/2ML IV SOLN
INTRAVENOUS | Status: DC | PRN
  Administered 2020-10-10: 200 mg via INTRAVENOUS

## 2020-10-10 MED ORDER — METOPROLOL TARTRATE 5 MG/5ML IV SOLN
INTRAVENOUS | Status: AC
  Filled 2020-10-10: qty 5

## 2020-10-10 MED ORDER — ROCURONIUM 10MG/ML (10ML) SYRINGE FOR MEDFUSION PUMP - OPTIME
INTRAVENOUS | Status: DC | PRN
  Administered 2020-10-10: 50 mg via INTRAVENOUS

## 2020-10-10 MED ORDER — FENTANYL CITRATE (PF) 100 MCG/2ML IJ SOLN
INTRAMUSCULAR | Status: AC
  Filled 2020-10-10: qty 2

## 2020-10-10 MED ORDER — LACTATED RINGERS IV SOLN
INTRAVENOUS | Status: DC
  Administered 2020-10-10: 1000 mL via INTRAVENOUS

## 2020-10-10 MED ORDER — CHLORHEXIDINE GLUCONATE 0.12 % MT SOLN
15.0000 mL | Freq: Once | OROMUCOSAL | Status: AC
  Administered 2020-10-10: 15 mL via OROMUCOSAL

## 2020-10-10 MED ORDER — TRANEXAMIC ACID-NACL 1000-0.7 MG/100ML-% IV SOLN
INTRAVENOUS | Status: AC
  Filled 2020-10-10: qty 100

## 2020-10-10 MED ORDER — PHENYLEPHRINE 40 MCG/ML (10ML) SYRINGE FOR IV PUSH (FOR BLOOD PRESSURE SUPPORT)
PREFILLED_SYRINGE | INTRAVENOUS | Status: AC
  Filled 2020-10-10: qty 10

## 2020-10-10 MED ORDER — METOPROLOL TARTRATE 5 MG/5ML IV SOLN
INTRAVENOUS | Status: DC | PRN
  Administered 2020-10-10: 2.5 mg via INTRAVENOUS

## 2020-10-10 MED ORDER — ONDANSETRON HCL 4 MG/2ML IJ SOLN
INTRAMUSCULAR | Status: AC
  Filled 2020-10-10: qty 2

## 2020-10-10 MED ORDER — ONDANSETRON HCL 4 MG/2ML IJ SOLN
INTRAMUSCULAR | Status: DC | PRN
  Administered 2020-10-10: 4 mg via INTRAVENOUS

## 2020-10-10 MED ORDER — CEFAZOLIN SODIUM-DEXTROSE 2-4 GM/100ML-% IV SOLN
INTRAVENOUS | Status: AC
  Administered 2020-10-11: 2 g via INTRAVENOUS
  Filled 2020-10-10: qty 100

## 2020-10-10 MED ORDER — ROCURONIUM BROMIDE 10 MG/ML (PF) SYRINGE
PREFILLED_SYRINGE | INTRAVENOUS | Status: AC
  Filled 2020-10-10: qty 10

## 2020-10-10 MED ORDER — ORAL CARE MOUTH RINSE: 15.0000 mL | Freq: Once | OROMUCOSAL | Status: AC

## 2020-10-10 MED ORDER — MIDAZOLAM HCL 2 MG/2ML IJ SOLN
INTRAMUSCULAR | Status: AC
  Filled 2020-10-10: qty 2

## 2020-10-10 MED ORDER — ONDANSETRON HCL 4 MG/2ML IJ SOLN: 4.0000 mg | Freq: Once | INTRAMUSCULAR | Status: DC | PRN

## 2020-10-10 SURGICAL SUPPLY — 67 items
APL PRP STRL LF DISP 70% ISPRP (MISCELLANEOUS) ×1
BALL HIP ARTICU 28 +5 (Hips) ×1 IMPLANT
BIPOLAR PROS AML 46 (Hips) ×2 IMPLANT
BIT DRILL 2.8X128 (BIT) ×2 IMPLANT
BLADE SAGITTAL 25.0X1.27X90 (BLADE) ×2 IMPLANT
BRUSH FEMORAL CANAL (MISCELLANEOUS) ×2 IMPLANT
CEMENT HV SMART SET (Cement) ×4 IMPLANT
CHLORAPREP W/TINT 26 (MISCELLANEOUS) ×2 IMPLANT
CLOTH BEACON ORANGE TIMEOUT ST (SAFETY) ×2 IMPLANT
COVER LIGHT HANDLE STERIS (MISCELLANEOUS) ×4 IMPLANT
DECANTER SPIKE VIAL GLASS SM (MISCELLANEOUS) ×2 IMPLANT
DRAPE HIP W/POCKET STRL (MISCELLANEOUS) ×2 IMPLANT
DRAPE SURG 17X23 STRL (DRAPES) ×2 IMPLANT
DRAPE U-SHAPE 47X51 STRL (DRAPES) ×2 IMPLANT
DRESSING AQUACEL AG ADV 3.5X12 (MISCELLANEOUS) ×1 IMPLANT
DRSG AQUACEL AG ADV 3.5X12 (MISCELLANEOUS) ×2
DRSG MEPILEX SACRM 8.7X9.8 (GAUZE/BANDAGES/DRESSINGS) ×2 IMPLANT
ELECT REM PT RETURN 9FT ADLT (ELECTROSURGICAL) ×2
ELECTRODE REM PT RTRN 9FT ADLT (ELECTROSURGICAL) ×1 IMPLANT
GLOVE SKINSENSE NS SZ8.0 LF (GLOVE) ×5
GLOVE SKINSENSE STRL SZ8.0 LF (GLOVE) ×5 IMPLANT
GLOVE SRG 8 PF TXTR STRL LF DI (GLOVE) ×1 IMPLANT
GLOVE SURG UNDER POLY LF SZ7 (GLOVE) ×6 IMPLANT
GLOVE SURG UNDER POLY LF SZ8 (GLOVE) ×2
GOWN STRL REUS W/ TWL XL LVL3 (GOWN DISPOSABLE) ×1 IMPLANT
GOWN STRL REUS W/TWL LRG LVL3 (GOWN DISPOSABLE) ×4 IMPLANT
GOWN STRL REUS W/TWL XL LVL3 (GOWN DISPOSABLE) ×4 IMPLANT
HANDPIECE INTERPULSE COAX TIP (DISPOSABLE) ×4
HEAD BIPOLAR PROS AML 46 (Hips) ×1 IMPLANT
HIP BALL ARTICU 28 +5 (Hips) ×2 IMPLANT
INST SET MAJOR BONE (KITS) ×2 IMPLANT
IV NS IRRIG 3000ML ARTHROMATIC (IV SOLUTION) ×4 IMPLANT
KIT BLADEGUARD II DBL (SET/KITS/TRAYS/PACK) ×2 IMPLANT
KIT TURNOVER KIT A (KITS) ×2 IMPLANT
MANIFOLD NEPTUNE II (INSTRUMENTS) ×2 IMPLANT
MARKER SKIN DUAL TIP RULER LAB (MISCELLANEOUS) ×2 IMPLANT
NEEDLE HYPO 18GX1.5 BLUNT FILL (NEEDLE) ×2 IMPLANT
NEEDLE HYPO 21X1.5 SAFETY (NEEDLE) ×2 IMPLANT
NEEDLE MA TROC 1/2 (NEEDLE) ×2 IMPLANT
NS IRRIG 1000ML POUR BTL (IV SOLUTION) ×2 IMPLANT
PACK SURGICAL SETUP 50X90 (CUSTOM PROCEDURE TRAY) ×2 IMPLANT
PACK TOTAL JOINT (CUSTOM PROCEDURE TRAY) ×2 IMPLANT
PAD ARMBOARD 7.5X6 YLW CONV (MISCELLANEOUS) ×2 IMPLANT
PASSER SUT SWANSON 36MM LOOP (INSTRUMENTS) IMPLANT
PENCIL SMOKE EVACUATOR (MISCELLANEOUS) ×2 IMPLANT
PILLOW ABDUCTION MEDIUM (MISCELLANEOUS) ×2 IMPLANT
PIN STMN SNGL STERILE 9X3.6MM (PIN) ×4 IMPLANT
PRESSURIZER FEMORAL UNIV (MISCELLANEOUS) ×2 IMPLANT
RESTRICTOR CEMENT PE SZ 2 (Cement) ×2 IMPLANT
SET BASIN LINEN APH (SET/KITS/TRAYS/PACK) ×2 IMPLANT
SET HNDPC FAN SPRY TIP SCT (DISPOSABLE) ×2 IMPLANT
STAPLER VISISTAT (STAPLE) ×2 IMPLANT
STEM DIST FEM CENTRALIZR 11 (Hips) ×2 IMPLANT
STEM SUMMIT CEMENTED BASIC SZ3 (Hips) ×1 IMPLANT
STRIP CLOSURE SKIN 1/2X4 (GAUZE/BANDAGES/DRESSINGS) ×4 IMPLANT
SUMMIT CEMENT BASIC SZ3 (Hips) ×2 IMPLANT
SUT BRALON NAB BRD #1 30IN (SUTURE) ×4 IMPLANT
SUT MNCRL AB 4-0 PS2 18 (SUTURE) ×4 IMPLANT
SUT MON AB 2-0 CT1 36 (SUTURE) ×4 IMPLANT
SUT VIC AB 1 CT1 27 (SUTURE) ×10
SUT VIC AB 1 CT1 27XBRD ANTBC (SUTURE) ×5 IMPLANT
SYR 30ML LL (SYRINGE) ×4 IMPLANT
SYR BULB IRRIG 60ML STRL (SYRINGE) ×4 IMPLANT
TOWER CARTRIDGE SMART MIX (DISPOSABLE) ×2 IMPLANT
TRAY FOLEY MTR SLVR 16FR STAT (SET/KITS/TRAYS/PACK) ×2 IMPLANT
WATER STERILE IRR 1000ML POUR (IV SOLUTION) ×4 IMPLANT
YANKAUER SUCT 12FT TUBE ARGYLE (SUCTIONS) ×2 IMPLANT

## 2020-10-10 NOTE — Care Management Important Message (Signed)
Important Message  Patient Details  Name: Rachel Fox MRN: [id number] Date of Birth: 06/28/1945   Medicare Important Message Given:  Yes     Corey Harold 10/10/2020, 12:00 PM

## 2020-10-10 NOTE — Plan of Care (Signed)
  Problem: Health Behavior/Discharge Planning: Goal: Ability to manage health-related needs will improve Outcome: Progressing   Problem: Clinical Measurements: Goal: Will remain free from infection Outcome: Progressing   Problem: Pain Managment: Goal: General experience of comfort will improve Outcome: Progressing   Problem: Skin Integrity: Goal: Risk for impaired skin integrity will decrease Outcome: Progressing   Problem: Education: Goal: Verbalization of understanding the information provided (i.e., activity precautions, restrictions, etc) will improve Outcome: Progressing Goal: Individualized Educational Video(s) Outcome: Progressing   Problem: Clinical Measurements: Goal: Postoperative complications will be avoided or minimized Outcome: Progressing   Problem: Self-Concept: Goal: Ability to maintain and perform role responsibilities to the fullest extent possible will improve Outcome: Progressing   

## 2020-10-10 NOTE — Transfer of Care (Signed)
Immediate Anesthesia Transfer of Care Note  Patient: Rachel Fox  Procedure(s) Performed: ARTHROPLASTY BIPOLAR HIP (HEMIARTHROPLASTY) (Left: Hip)  Patient Location: PACU  Anesthesia Type:General  Level of Consciousness: awake  Airway & Oxygen Therapy: Patient Spontanous Breathing and Patient connected to face mask oxygen  Post-op Assessment: Report given to RN and Post -op Vital signs reviewed and stable  Post vital signs: Reviewed and stable  Last Vitals:  Vitals Value Taken Time  BP    Temp    Pulse    Resp 11 10/10/20 1644  SpO2    Vitals shown include unvalidated device data.  Last Pain:  Vitals:   10/10/20 1254  TempSrc: Oral  PainSc: 0-No pain      Patients Stated Pain Goal: 9 (10/10/20 1254)  Complications: No notable events documented.

## 2020-10-10 NOTE — Telephone Encounter (Signed)
Patient's designated contact said just missed Dr's call; on phone if available. Or call back to [phone]

## 2020-10-10 NOTE — Progress Notes (Signed)
   ORTHOPAEDIC PROGRESS NOTE  Scheduled for Procedure(s): ARTHROPLASTY BIPOLAR HIP (HEMIARTHROPLASTY)  DOS: 10/10/20  SUBJECTIVE: Patient complaining of a dry mouth.  She is hungry and having some nausea this morning.  No pain in her hip when she is resting.  Pain with movement.   OBJECTIVE: PE:  Alert and oriented.  No acute distress  Decreased sensation and ROM of the left ankle and great toe.  Toes are warm and well perfused.  Ecchymosis over the lateral thigh.  TTP lateral thigh.  ROM of the hip deferred.   Vitals:   10/10/20 0012 10/10/20 0351  BP: 125/69 133/69  Pulse: 97 78  Resp: 18 19  Temp: 97.6 F (36.4 C) 97.9 F (36.6 C)  SpO2: 94% 99%     ASSESSMENT: Rachel Fox is a 75 y.o. female, stable, ready for OR.  Plan for left hip hemiarthroplasty.  Cleared by cardiology.  Last dose of eliquis was 7/26, early morning.  NPO since midnight  PLAN: Weightbearing: NWB LLE Insicional and dressing care: Reinforce dressings as needed; none currently Orthopedic device(s): None VTE prophylaxis:  Resume Eliquis POD#1   Pain control: PRN; judicious use of narcotics Follow - up plan: 2 weeks after surgery   Contact information:     Joey Lierman A. Dallas Schimke, MD MS [facility] [address] [place],  [place]  [address] Phone: [phone] Fax: [phone]

## 2020-10-10 NOTE — Progress Notes (Signed)
History and Physical   Patient: Rachel Fox                            PCP: Benita Stabile, MD                    DOB: 10-24-1945            DOA: 10/09/2020 WUJ:[id number]             DOS: 10/10/2020, 9:23 AM  Patient coming from:   HOME  I have personally reviewed patient's medical records, in electronic medical records, including:  [facility], and care everywhere.   PCP: Benita Stabile, MD  CC: Status post fall, hip pain  Subjective:    The patient was seen and examined this morning, n.p.o., still complaining of hip pain but managed with pain medication, complaining of dry mouth, n.p.o. in anticipation of surgical intervention today  Discussed with orthopedic team and cardiologist patient may be cleared for surgery today  History of present illness:    Rachel Fox is a 75 y.o. female with medical history significant of A. fib, on Eliquis, HTN, HLD, CVA with left-sided weakness, left arm contracture, seizures, diabetes mellitus type II, presenting with hip pain, status post fall 2 days ago. Reports reaching out for walker, was accidental fall onto her left side on her hip. Denies of hitting her head, or passing out..    Patient Denies having: Fever, Chills, Cough, SOB, Chest Pain, Abd pain, N/V/D, headache, dizziness, lightheadedness,  Dysuria, Joint pain, rash, open wounds  ED Course:   Blood pressure (!) 162/85, pulse 74, temperature 98.8 F (37.1 C), temperature source Oral, resp. rate 17, height 5\' 3"  (1.6 m), weight 55.8 kg, SpO2 100 %.  Abnormal labs; CBC CMP within normal notes exception of potassium 3.0, glucose 112 CT pelvis without contrast - 1. Mildly displaced left femoral neck fracture. 2. No pelvic fractures or bone lesions. 3. Subcutaneous contusion/hematoma overlying the left hip area.  Right knee x-ray-degenerative changes, negative any fractures or effusion Left knee femur x-ray-  Mildly displaced femoral neck fracture.     Assessment / Plan:    Principal Problem:    Left Hip fx (HCC) -Still complaining of pain, limited range of motion due to pain-optimizing pain management -Left hip fracture-CT pelvis without contrast reviewed revealing nondisplaced left femoral neck fracture, pelvic fracture, subcutaneous hematoma left hip area -Patient on Eliquis due to chronic -last dose morning of 10/09/2020 -according to the patient -We will admit for close monitoring -Orthopedic team consulted, anticipating surgical intervention in next 24-48 hours Awaiting Eliquis washout - Patient remains at moderate risk for surgery,  Cardiology was consulted for surgical clearance-preop evaluation, recommend to proceed with surgery as planned no indication for further cardiac work-up.   Active Problems:    Cerebrovascular accident (CVA) with involvement of left side of body (HCC) -Stable no changes -with left-sided weakness, left upper extremity contracture -On Eliquis, statins, -Request due to hip fracture, hematoma -Avoiding anticoagulation due to left hip hematoma, will monitor H&H post    Hypertension  -Elevated BP, monitoring -as needed hydralazine, holding lisinopril, not on any beta-blockers  Type 2 diabetes mellitus (HCC)  -CBG QA CHS, SSI coverage -Checking hemoglobin A1c    Hyperlipidemia -continue Lipitor      Atrial fibrillation (HCC)  -continue home medication of amiodarone, anticipating adding beta-blockers -Withholding Eliquis awaiting washout for orthopedic surgery -Withholding oral  anticoagulation due to left hip pain with hematoma monitoring H&H  Sinus node dysfunction (HCC) -on pacemaker, on amiodarone     Cultures:  -none  Antimicrobial: -None  Consults called: Orthopedic team Dr.Craine , cardiology -------------------------------------------------------------------------------------------------------------------------------------------- DVT prophylaxis: SCD/Compression stockings holding home medication of  Eliquis Code Status:   Code Status: Full Code   Admission status: Patient will be admitted as Inpatient, with a greater than 2 midnight length of stay. Level of care: Telemetry   Family Communication:  none at bedside  (The above findings and plan of care has been discussed with patient in detail, the patient expressed understanding and agreement of above plan)  -------------------------------------------------------------------------------------------------------------------------------------------------  Disposition Plan: >3 days Status is: Inpatient  Remains inpatient appropriate because:Inpatient level of care appropriate due to severity of illness  Dispo: The patient is from: Home              Anticipated d/c is to: SNF in 3-5 days               Patient currently is not medically stable to d/c.  N.p.o. anticipating surgical intervention today   Difficult to place patient No        Scheduled Meds:  amiodarone  100 mg Oral Daily   atorvastatin  80 mg Oral q1800   insulin aspart  0-9 Units Subcutaneous TID WC   levETIRAcetam  500 mg Oral BID   mupirocin ointment  1 application Nasal BID   potassium chloride SA  40 mEq Oral Once   sodium chloride flush  3 mL Intravenous Q12H   sodium chloride flush  3 mL Intravenous Q12H   Continuous Infusions:  sodium chloride 100 mL/hr at 10/10/20 0316   sodium chloride      ceFAZolin (ANCEF) IV     tranexamic acid     PRN Meds:.sodium chloride, acetaminophen **OR** acetaminophen, bisacodyl, fentaNYL (SUBLIMAZE) injection, hydrALAZINE, HYDROmorphone (DILAUDID) injection, ipratropium, levalbuterol, magnesium citrate, ondansetron **OR** ondansetron (ZOFRAN) IV, oxyCODONE, senna-docusate, sodium chloride flush, traZODone  PRN MEDs: sodium chloride, acetaminophen **OR** acetaminophen, bisacodyl, fentaNYL (SUBLIMAZE) injection, hydrALAZINE, HYDROmorphone (DILAUDID) injection, ipratropium, levalbuterol, magnesium citrate, ondansetron **OR**  ondansetron (ZOFRAN) IV, oxyCODONE, senna-docusate, sodium chloride flush, traZODone  Past Medical History:  Diagnosis Date   Atrial fibrillation (HCC)    CVA (cerebral vascular accident) (HCC)    High cholesterol    Hypertension    Seizures (HCC)    Type 2 diabetes mellitus (HCC)     Past Surgical History:  Procedure Laterality Date   ABDOMINAL HYSTERECTOMY     PACEMAKER IMPLANT N/A 08/22/2020   Procedure: PACEMAKER IMPLANT;  Surgeon: Marinus Maw, MD;  Location: [facility];  Service: Cardiovascular;  Laterality: N/A;   TUBAL LIGATION       reports that she quit smoking about 32 years ago. Her smoking use included cigarettes. She has never used smokeless tobacco. She reports that she does not drink alcohol and does not use drugs.   Family History  Problem Relation Age of Onset   Stroke Mother    Heart attack Father    Stroke Sister    Heart attack Sister     Physical Exam:   Vitals:   10/09/20 2004 10/10/20 0012 10/10/20 0351 10/10/20 0500  BP: (!) 125/59 125/69 133/69   Pulse: 89 97 78   Resp: 19 18 19    Temp: 98.2 F (36.8 C) 97.6 F (36.4 C) 97.9 F (36.6 C)   TempSrc: Oral Oral Oral   SpO2: 98% 94% 99%  Weight:    53.7 kg  Height:          Physical Exam:   General:  Alert, oriented, cooperative, no distress;   HEENT:  Normocephalic, PERRL, otherwise with in Normal limits   Neuro:  CNII-XII intact. , normal motor and sensation, reflexes intact   Lungs:   Clear to auscultation BL, Respirations unlabored, no wheezes / crackles  Cardio:    S1/S2, RRR, No murmure, No Rubs or Gallops   Abdomen:   Soft, non-tender, bowel sounds active all four quadrants,  no guarding or peritoneal signs.  Muscular skeletal:  Chronic left-sided weakness, left upper arm weakness, muscle wasting contraction of the hand  Left hip pain edema, range of motion limited due to pain  Limited exam - in bed, able to move all 4 extremities,  2+ pulses,  symmetric, No pitting  edema  Skin:  Dry, warm to touch, negative for any Rashes,  Wounds: Please see nursing documentation             Labs on admission:    I have personally reviewed following labs and imaging studies  CBC: Recent Labs  Lab 10/09/20 1133 10/10/20 0343  WBC 8.4 9.9  NEUTROABS 5.9  --   HGB 13.5 11.5*  HCT 41.6 37.9  MCV 91.2 95.2  PLT 169 164   Basic Metabolic Panel: Recent Labs  Lab 10/09/20 1133 10/10/20 0343  NA 140 143  K 3.0* 3.6  CL 102 107  CO2 29 24  GLUCOSE 112* 98  BUN 14 16  CREATININE 0.75 0.80  CALCIUM 9.7 9.6  MG 2.0  --    GFR: Estimated Creatinine Clearance: 51.8 mL/min (by C-G formula based on SCr of 0.8 mg/dL). Liver Function Tests: Recent Labs  Lab 10/09/20 1133  AST 31  ALT 26  ALKPHOS 59  BILITOT 0.9  PROT 7.7  ALBUMIN 3.6   No results for input(s): LIPASE, AMYLASE in the last 168 hours. No results for input(s): AMMONIA in the last 168 hours. Coagulation Profile: Recent Labs  Lab 10/10/20 0343  INR 1.2   Cardiac Enzymes: Recent Labs  Lab 10/09/20 1133  CKTOTAL 219   BNP (last 3 results) No results for input(s): PROBNP in the last 8760 hours. HbA1C: Recent Labs    10/09/20 1133  HGBA1C 5.6   CBG: Recent Labs  Lab 10/09/20 0942 10/09/20 1711 10/09/20 2355 10/10/20 0729  GLUCAP 93 83 113* 93   Lipid Profile: No results for input(s): CHOL, HDL, LDLCALC, TRIG, CHOLHDL, LDLDIRECT in the last 72 hours. Thyroid Function Tests: Recent Labs    10/09/20 1133  TSH 4.107   Anemia Panel: No results for input(s): VITAMINB12, FOLATE, FERRITIN, TIBC, IRON, RETICCTPCT in the last 72 hours. Urine analysis:    Component Value Date/Time   COLORURINE YELLOW 10/09/2020 1049   APPEARANCEUR HAZY (A) 10/09/2020 1049   LABSPEC 1.019 10/09/2020 1049   PHURINE 6.0 10/09/2020 1049   GLUCOSEU NEGATIVE 10/09/2020 1049   HGBUR NEGATIVE 10/09/2020 1049   BILIRUBINUR NEGATIVE 10/09/2020 1049   KETONESUR 20 (A) 10/09/2020 1049    PROTEINUR NEGATIVE 10/09/2020 1049   NITRITE NEGATIVE 10/09/2020 1049   LEUKOCYTESUR NEGATIVE 10/09/2020 1049     Radiologic Exams on Admission:   DG Chest 1 View  Result Date: 10/09/2020 CLINICAL DATA:  Larey Seat. Left hip fracture. EXAM: CHEST  1 VIEW COMPARISON:  08/22/2020 FINDINGS: The pacer wires are in good position, unchanged. The cardiac silhouette, mediastinal and hilar contours are normal. The lungs  are clear. The bony thorax is intact. IMPRESSION: No acute cardiopulmonary findings. Electronically Signed   By: Rudie MeyerP.  Gallerani M.D.   On: 10/09/2020 12:50   CT PELVIS WO CONTRAST  Result Date: 10/09/2020 CLINICAL DATA:  Larey SeatFell. Left hip pain. EXAM: CT PELVIS WITHOUT CONTRAST TECHNIQUE: Multidetector CT imaging of the pelvis was performed following the standard protocol without intravenous contrast. COMPARISON:  None. FINDINGS: Urinary Tract:  The bladder is unremarkable. Bowel: The rectum, sigmoid colon and visualized small bowel loops are unremarkable. Vascular/Lymphatic: Vascular calcifications but no aneurysm. No adenopathy. Reproductive:  Surgically absent. Other:  No free pelvic fluid or pelvic hematoma. Musculoskeletal: As demonstrated on the radiographs there is a mildly displaced left femoral neck fracture. Mild impaction and varus deformity. The right hip is intact. The pubic symphysis and SI joints are intact. No pelvic fractures or bone lesions. Degenerative changes at the pubic symphysis and chondrocalcinosis are noted. There is a subcutaneous contusion/hematoma overlying the left hip area. No significant intramuscular hematoma. IMPRESSION: 1. Mildly displaced left femoral neck fracture. 2. No pelvic fractures or bone lesions. 3. Subcutaneous contusion/hematoma overlying the left hip area. Electronically Signed   By: Rudie MeyerP.  Gallerani M.D.   On: 10/09/2020 12:53   DG Knee Complete 4 Views Right  Result Date: 10/09/2020 CLINICAL DATA:  Larey SeatFell. Right knee pain. EXAM: RIGHT KNEE - COMPLETE 4+  VIEW COMPARISON:  None. FINDINGS: Mild degenerative changes are noted with early joint space narrowing and spurring. There is also chondrocalcinosis. No acute fracture or osteochondral lesion. No joint effusion. IMPRESSION: 1. Mild degenerative changes and chondrocalcinosis. 2. No acute bony findings or joint effusion. Electronically Signed   By: Rudie MeyerP.  Gallerani M.D.   On: 10/09/2020 12:48   DG Femur Min 2 Views Left  Result Date: 10/09/2020 CLINICAL DATA:  Larey SeatFell. Left hip pain. EXAM: LEFT FEMUR 2 VIEWS COMPARISON:  None. FINDINGS: There is a mildly displaced femoral neck fracture and mild varus deformity. No femoral shaft fracture. IMPRESSION: Mildly displaced femoral neck fracture. Electronically Signed   By: Rudie MeyerP.  Gallerani M.D.   On: 10/09/2020 12:50    EKG:   Independently reviewed.  Orders placed or performed during the hospital encounter of 10/09/20   ED EKG   ED EKG   ED EKG   ED EKG   EKG 12-Lead   ---------------------------------------------------------------------------------------------------------------------------------------  ------------------------------------------------------------------------------------------------------------------------------------------------  Time spent: > than  35  Min.   SIGNED: Kendell BaneSeyed A Dallon Dacosta, MD, FHM. [facility],  Pager (Please use [email or web address] to Fox to text)  If 7PM-7AM, please contact night-coverage [email or web address],  10/10/2020, 9:23 AM

## 2020-10-10 NOTE — Progress Notes (Signed)
OT Cancellation Note  Patient Details Name: Rachel Fox MRN: [id number] DOB: [birth year]   Cancelled Treatment:    Reason Eval/Treat Not Completed: Patient not medically ready;Medical issues which prohibited therapy. Pt with Left Hip Displaced femoral neck fracture, per ortho consult pt will have surgery on 7/27 or 7/29 for hemiarthroplasty. OT will sign off and a new order will be needed when pt is medically stable and ready for rehab services.     Ezra Sites, OTR/L  [phone] 10/10/2020, 7:52 AM

## 2020-10-10 NOTE — Consult Note (Addendum)
Cardiology Consultation:   Patient ID: Rachel Fox MRN: [id number]; DOB: Aug 30, 1945  Admit date: 10/09/2020 Date of Consult: 10/10/2020  PCP:  Benita Stabile, MD   [facility] Providers Cardiologist:  Dina Rich, MD  Electrophysiologist:  Lewayne Bunting, MD  {    Patient Profile:   Rachel Fox is a 75 y.o. female with a hx of afib/aflutter, HTN,DM2,pacemaker who is being seen 10/10/2020 for the evaluation of preoperative cardiac evaluation at the request of Dr Flossie Dibble.  History of Present Illness:   Rachel Fox 75 yo female history of afib/aflutter, prior CVA, HTN HL, DM2, tachy brady with pacemaker presents after having a mechanical fall at home. Found to have a left hip fracture, cardiology is consulted for preoperative cardiac evaluation for repair. From cardiac standpoint deneis any recent chest pain, SOB, palpitations.   K 3 Cr 0.75 BUN 14 WBC 8.4 Hgb 13.5 Plt 169 Mg 2 BNP 31 TSH 4.1 COVID Neg CXR no acute process EKG SR, LVH, nonspecific ST/T changes   05/2020 echo LVEF 55-60%, no WMAs, normal RV  Past Medical History:  Diagnosis Date   Atrial fibrillation (HCC)    CVA (cerebral vascular accident) (HCC)    High cholesterol    Hypertension    Seizures (HCC)    Type 2 diabetes mellitus (HCC)     Past Surgical History:  Procedure Laterality Date   ABDOMINAL HYSTERECTOMY     PACEMAKER IMPLANT N/A 08/22/2020   Procedure: PACEMAKER IMPLANT;  Surgeon: Marinus Maw, MD;  Location: [facility];  Service: Cardiovascular;  Laterality: N/A;   TUBAL LIGATION        Inpatient Medications: Scheduled Meds:  amiodarone  100 mg Oral Daily   atorvastatin  80 mg Oral q1800   insulin aspart  0-9 Units Subcutaneous TID WC   levETIRAcetam  500 mg Oral BID   mupirocin ointment  1 application Nasal BID   potassium chloride SA  40 mEq Oral Once   sodium chloride flush  3 mL Intravenous Q12H   sodium chloride flush  3 mL Intravenous Q12H   Continuous  Infusions:  sodium chloride 100 mL/hr at 10/10/20 0316   sodium chloride      ceFAZolin (ANCEF) IV     tranexamic acid     PRN Meds: sodium chloride, acetaminophen **OR** acetaminophen, bisacodyl, fentaNYL (SUBLIMAZE) injection, hydrALAZINE, HYDROmorphone (DILAUDID) injection, ipratropium, levalbuterol, magnesium citrate, ondansetron **OR** ondansetron (ZOFRAN) IV, oxyCODONE, senna-docusate, sodium chloride flush, traZODone  Allergies:    Allergies  Allergen Reactions   Lactose Intolerance (Gi) Other (See Comments)    Causes Gas in patient.    Social History:   Social History   Socioeconomic History   Marital status: Divorced    Spouse name: Not on file   Number of children: Not on file   Years of education: Not on file   Highest education level: Not on file  Occupational History   Not on file  Tobacco Use   Smoking status: Former    Types: Cigarettes    Quit date: 04/14/1988    Years since quitting: 32.5   Smokeless tobacco: Never  Vaping Use   Vaping Use: Never used  Substance and Sexual Activity   Alcohol use: Never   Drug use: Never   Sexual activity: Not on file  Other Topics Concern   Not on file  Social History Narrative   Not on file   Social Determinants of Health   Financial Resource Strain: Not on  file  Food Insecurity: Not on file  Transportation Needs: Not on file  Physical Activity: Not on file  Stress: Not on file  Social Connections: Not on file  Intimate Partner Violence: Not on file    Family History:    Family History  Problem Relation Age of Onset   Stroke Mother    Heart attack Father    Stroke Sister    Heart attack Sister      ROS:  Please see the history of present illness.   All other ROS reviewed and negative.     Physical Exam/Data:   Vitals:   10/09/20 2004 10/10/20 0012 10/10/20 0351 10/10/20 0500  BP: (!) 125/59 125/69 133/69   Pulse: 89 97 78   Resp: 19 18 19    Temp: 98.2 F (36.8 C) 97.6 F (36.4 C) 97.9 F  (36.6 C)   TempSrc: Oral Oral Oral   SpO2: 98% 94% 99%   Weight:    53.7 kg  Height:        Intake/Output Summary (Last 24 hours) at 10/10/2020 0751 Last data filed at 10/10/2020 0550 Gross per 24 hour  Intake 1845.19 ml  Output 450 ml  Net 1395.19 ml   Last 3 Weights 10/10/2020 10/09/2020 10/09/2020  Weight (lbs) 118 lb 6.2 oz 118 lb 2.7 oz 123 lb  Weight (kg) 53.7 kg 53.6 kg 55.792 kg     Body mass index is 20.97 kg/m.  General:  Well nourished, well developed, in no acute distress HEENT: normal Lymph: no adenopathy Neck: no JVD Endocrine:  No thryomegaly Vascular: No carotid bruits; FA pulses 2+ bilaterally without bruits  Cardiac:  normal S1, S2; RRR; no murmur  Lungs:  clear to auscultation bilaterally, no wheezing, rhonchi or rales  Abd: soft, nontender, no hepatomegaly  Ext: no edema Musculoskeletal:  No deformities, BUE and BLE strength normal and equal Skin: warm and dry  Neuro:  CNs 2-12 intact, no focal abnormalities noted Psych:  Normal affect     Laboratory Data:  High Sensitivity Troponin:  No results for input(s): TROPONINIHS in the last 720 hours.   Chemistry Recent Labs  Lab 10/09/20 1133 10/10/20 0343  NA 140 143  K 3.0* 3.6  CL 102 107  CO2 29 24  GLUCOSE 112* 98  BUN 14 16  CREATININE 0.75 0.80  CALCIUM 9.7 9.6  GFRNONAA >60 >60  ANIONGAP 9 12    Recent Labs  Lab 10/09/20 1133  PROT 7.7  ALBUMIN 3.6  AST 31  ALT 26  ALKPHOS 59  BILITOT 0.9   Hematology Recent Labs  Lab 10/09/20 1133 10/10/20 0343  WBC 8.4 9.9  RBC 4.56 3.98  HGB 13.5 11.5*  HCT 41.6 37.9  MCV 91.2 95.2  MCH 29.6 28.9  MCHC 32.5 30.3  RDW 14.8 14.9  PLT 169 164   BNP Recent Labs  Lab 10/09/20 1133  BNP 31.0    DDimer No results for input(s): DDIMER in the last 168 hours.   Radiology/Studies:  DG Chest 1 View  Result Date: 10/09/2020 CLINICAL DATA:  10/11/2020. Left hip fracture. EXAM: CHEST  1 VIEW COMPARISON:  08/22/2020 FINDINGS: The pacer wires  are in good position, unchanged. The cardiac silhouette, mediastinal and hilar contours are normal. The lungs are clear. The bony thorax is intact. IMPRESSION: No acute cardiopulmonary findings. Electronically Signed   By: 10/22/2020 M.D.   On: 10/09/2020 12:50   CT PELVIS WO CONTRAST  Result Date: 10/09/2020 CLINICAL DATA:  Fell. Left hip pain. EXAM: CT PELVIS WITHOUT CONTRAST TECHNIQUE: Multidetector CT imaging of the pelvis was performed following the standard protocol without intravenous contrast. COMPARISON:  None. FINDINGS: Urinary Tract:  The bladder is unremarkable. Bowel: The rectum, sigmoid colon and visualized small bowel loops are unremarkable. Vascular/Lymphatic: Vascular calcifications but no aneurysm. No adenopathy. Reproductive:  Surgically absent. Other:  No free pelvic fluid or pelvic hematoma. Musculoskeletal: As demonstrated on the radiographs there is a mildly displaced left femoral neck fracture. Mild impaction and varus deformity. The right hip is intact. The pubic symphysis and SI joints are intact. No pelvic fractures or bone lesions. Degenerative changes at the pubic symphysis and chondrocalcinosis are noted. There is a subcutaneous contusion/hematoma overlying the left hip area. No significant intramuscular hematoma. IMPRESSION: 1. Mildly displaced left femoral neck fracture. 2. No pelvic fractures or bone lesions. 3. Subcutaneous contusion/hematoma overlying the left hip area. Electronically Signed   By: Rudie Meyer M.D.   On: 10/09/2020 12:53   DG Knee Complete 4 Views Right  Result Date: 10/09/2020 CLINICAL DATA:  Larey Seat. Right knee pain. EXAM: RIGHT KNEE - COMPLETE 4+ VIEW COMPARISON:  None. FINDINGS: Mild degenerative changes are noted with early joint space narrowing and spurring. There is also chondrocalcinosis. No acute fracture or osteochondral lesion. No joint effusion. IMPRESSION: 1. Mild degenerative changes and chondrocalcinosis. 2. No acute bony findings or joint  effusion. Electronically Signed   By: Rudie Meyer M.D.   On: 10/09/2020 12:48   DG Femur Min 2 Views Left  Result Date: 10/09/2020 CLINICAL DATA:  Larey Seat. Left hip pain. EXAM: LEFT FEMUR 2 VIEWS COMPARISON:  None. FINDINGS: There is a mildly displaced femoral neck fracture and mild varus deformity. No femoral shaft fracture. IMPRESSION: Mildly displaced femoral neck fracture. Electronically Signed   By: Rudie Meyer M.D.   On: 10/09/2020 12:50     Assessment and Plan:   1.Preoperative evaluation - presents after mechanical fall, left hip fracture requiring surgical repair - on active acute cardiac conditions to affect surgery - regular exertion is limited by chronic unsteady gait, prior CVA. Uses walker. Does report as recent as last month with physical therapy was walking up a flight of stairs without cardiopulmonary symptoms - would recommend proceeding with surgery as planned. There is no indication for prior cardiac testing at this time that would uneccesarily delay her neccesary surgery.    2. Afib/aflutter - continue home amiodarone -eliquis on hold for surgery, resume when ok from surgical standpoint. Typically held 48 hrs prior to surgery and resumed day after, if more extended hold is favored by surgery is reasonable to do. No need to bridge with heparin.  - if issues with afib/aflutter postop would start on amio drip.   3.Tachy/brady with pacemaker -no active issues.   We will sign off inpatient care, call us back if needed in postop period.      For questions or updates, please contact [facility] Please consult [email or web address] for contact info under    Signed, Dina Rich, MD  10/10/2020 7:51 AM

## 2020-10-10 NOTE — Anesthesia Procedure Notes (Signed)
Procedure Name: Intubation Date/Time: 10/10/2020 1:48 PM Performed by: Louann Sjogren, MD Pre-anesthesia Checklist: Patient identified and Emergency Drugs available Patient Re-evaluated:Patient Re-evaluated prior to induction Oxygen Delivery Method: Circle System Utilized Preoxygenation: Pre-oxygenation with 100% oxygen Induction Type: IV induction Ventilation: Mask ventilation without difficulty Laryngoscope Size: Mac and 3 Grade View: Grade I Tube type: Oral Tube size: 7.0 mm Number of attempts: 1 Airway Equipment and Method: Stylet Placement Confirmation: ETT inserted through vocal cords under direct vision, positive ETCO2 and breath sounds checked- equal and bilateral Secured at: 21 cm Tube secured with: Tape Dental Injury: Teeth and Oropharynx as per pre-operative assessment

## 2020-10-10 NOTE — Op Note (Signed)
Orthopaedic Surgery Operative Note (CSN: [id number])  Rachel Fox  [birth year] Date of Surgery: 10/10/2020   Diagnoses:  Left femoral neck fracture  Procedure: Cemented left hip hemiarthroplasty   Operative Finding Successful completion of the planned procedure.  Placement of cemented left hip hemiarthroplasty without complication.  Stable in sitting position    Post-Op Diagnosis: Same Surgeons:Primary: Oliver Barre, MD Assisting: Vickki Hearing, MD Assistants: Cecile Sheerer Location: [facility] OR ROOM 4 Anesthesia: General with local anesthesia Antibiotics: Ancef 2 g with local vancomycin powder 1 g at the surgical site Tourniquet time: N/A Estimated Blood Loss: 200 cc Complications: None Specimens: None Implants: Implant Name Type Inv. Item Serial No. Manufacturer Lot No. LRB No. Used Action  SUMMIT CEMENT BASIC SZ3 - [id number] Hips SUMMIT CEMENT BASIC SZ3  [employer]PU[employer] [id number] Left 1 Implanted  RESTRICTOR CEMENT PE SZ 2 - [id number] Cement RESTRICTOR CEMENT PE SZ 2  DEPU[employer] ORTH[employer] [id number] Left 1 Implanted  stem centralizer    DEPU[employer] ORTH[employer] [id number] Left 1 Implanted  CEMENT HV SMART SET - [id number] Cement CEMENT HV SMART SET  DEPUY ORTH[employer] [id number] Left 2 Implanted  BIPOLAR PROS AML - [id number] Hips BIPOLAR PROS AML  DEPUY ORTH[employer] [id number] Left 1 Implanted  HIP BALL ARTICU 28 +5 - [id number] Hips HIP BALL ARTICU 28 +5  DEPU[employer] ORTH[employer] [id number] Left 1 Implanted    Indications for Surgery:   Rachel Fox is a 75 y.o. female who fell and sustained a displaced left femoral neck fracture.  In order to restore previous form and function, I recommended operative fixation with a hip hemiarthroplasty.  Benefits and risks of operative and nonoperative management were discussed prior to surgery with patient and her HCPOA and informed consent form was completed.  Specific risks including infection, need for additional surgery, bleeding, poor  healing, persistent pain, dislocation and more severe complications associated with anesthesia.  She elected proceed.  She was cleared for surgery by medicine and her cardiologist.   Procedure:   The patient was identified properly. Informed consent was obtained and the surgical site was marked. The patient was taken to the OR where general anesthesia was induced.  The patient was positioned lateral, with the left side up.  The left leg was prepped and draped in the usual sterile fashion.  Timeout was performed before the beginning of the case.  Ancef dosing was confirmed prior to incision.  Patient received 1 g of TXA before the start of the case  We started by making an incision centered over the greater trochanter with a scalpel. We used the scalpel to continue to dissect to the fascia.  Electrocautery was used to help with hemostasis.  A cobb elevator was used to clear the IT band and then it was pierced with Bovie electrocautery. Mayo scissors were used to cut the fascia in a longitudinal fashion. The gluteus maximus fibers were bluntly split in line with their fibers.   The femur was slowly internally rotated, putting tension on the posterior structures. The short external rotators were identified and tagged with #1 vicryl.  Bovie electrocautery was used to dissect the short external rotators off of the insertion onto the femur.  Next, we identified the capsule and made a T capsulotomy.  The capsule was then tagged with #1 Vicryl sutures.   Next, we visualized the femoral neck fracture. We identified the sciatic nerve by palpation and verified that it was not in danger during the dissection.    We  then made a neck cut approximately 1.5 cm above the lesser trochanter with a saw. We removed the femoral head. We used the box cutter to cut away some of the greater trochanter for ease of insertion of the stem. We then used the canal finder to locate the femoral canal.   Using the angle of the femoral  neck as our guide for version, we broached sequentially. We trialed components and found appropriate fit and stability.  The patient would come to full extension of the hip. The hip was stable at 90 degrees of flexion, and 45 degrees of internal rotation.  Leg lengths were approximately equal.  We then removed the trials as well as the broach. We ensured there were no foreign bodies or bone within the acetabulum. We copiously irrigated the wound.  We measured and placed an appropriately sized cement restrictor within the canal.  Cement was mixed on the back table and when ready, cement was inserted into the canal and pressurized.  We then carefully placed our stem.  Excess cement was removed immediately.  We maintained pressure on the stem while the cement hardened.  An appropriately sized head was placed on to the stem and the hip was reduced. Again, we noted it was stable in the previously mentioned manipulations.   We repaired the posterior capsule.  Short external rotators repaired to the greater trochanter to bone tunnels. We closed the fascia of the iliotibial band and gluteus maximus with running and intterupted Vicryl sutures. We then closed Scarpa's fascia with Vicryl sutures. Skin was closed with 2-0 monocryl and staples. An Aquacel dressing was placed.  A hib abduction pillow was secured.  Patient was awoken taken to PACU in stable condition.   Post-operative plan:  The patient will be WBAT on the operative extremity. Hip abduction pillow in place at all times when in bed Posterior hip precautions PT/OT    DVT prophylaxis per primary team, no orthopedic contraindications.   She can resume her eliquis on POD#1 Pain control with PRN pain medication preferring oral medicines.   Follow up plan will be scheduled in approximately 14 days for incision check and XR.

## 2020-10-10 NOTE — Anesthesia Preprocedure Evaluation (Signed)
Anesthesia Evaluation  Patient identified by MRN, date of birth, ID band Patient awake    Reviewed: Allergy & Precautions, H&P , NPO status , Patient's Chart, lab work & pertinent test results, reviewed documented beta blocker date and time   Airway Mallampati: II  TM Distance: >3 FB Neck ROM: full    Dental no notable dental hx.    Pulmonary neg pulmonary ROS, former smoker,    Pulmonary exam normal breath sounds clear to auscultation       Cardiovascular Exercise Tolerance: Good hypertension, negative cardio ROS  Atrial Fibrillation  Rhythm:irregular Rate:Normal     Neuro/Psych Seizures -,  CVA, Residual Symptoms negative psych ROS   GI/Hepatic negative GI ROS, Neg liver ROS,   Endo/Other  negative endocrine ROSdiabetes  Renal/GU negative Renal ROS  negative genitourinary   Musculoskeletal   Abdominal   Peds  Hematology negative hematology ROS (+)   Anesthesia Other Findings   Reproductive/Obstetrics negative OB ROS                             Anesthesia Physical Anesthesia Plan  ASA: 3  Anesthesia Plan: General   Post-op Pain Management:    Induction:   PONV Risk Score and Plan: Ondansetron  Airway Management Planned:   Additional Equipment:   Intra-op Plan:   Post-operative Plan:   Informed Consent: I have reviewed the patients History and Physical, chart, labs and discussed the procedure including the risks, benefits and alternatives for the proposed anesthesia with the patient or authorized representative who has indicated his/her understanding and acceptance.     Dental Advisory Given  Plan Discussed with: CRNA  Anesthesia Plan Comments:         Anesthesia Quick Evaluation

## 2020-10-10 NOTE — Anesthesia Postprocedure Evaluation (Signed)
Anesthesia Post Note  Patient: Rachel Fox  Procedure(s) Performed: ARTHROPLASTY BIPOLAR HIP (HEMIARTHROPLASTY) (Left: Hip)  Patient location during evaluation: Phase II Anesthesia Type: General Level of consciousness: awake Pain management: pain level controlled Vital Signs Assessment: post-procedure vital signs reviewed and stable Respiratory status: spontaneous breathing and respiratory function stable Cardiovascular status: blood pressure returned to baseline and stable Postop Assessment: no headache and no apparent nausea or vomiting Anesthetic complications: no Comments: Late entry   No notable events documented.   Last Vitals:  Vitals:   10/10/20 1315 10/10/20 1645  BP: (!) 157/71 (!) 120/47  Pulse: (!) 124   Resp: (!) 34 11  Temp:  (P) 36.6 C  SpO2: 96%     Last Pain:  Vitals:   10/10/20 1254  TempSrc: Oral  PainSc: 0-No pain                 Windell Norfolk

## 2020-10-11 ENCOUNTER — Encounter (HOSPITAL_COMMUNITY): Payer: Self-pay | Admitting: Orthopedic Surgery

## 2020-10-11 DIAGNOSIS — I10 Essential (primary) hypertension: Secondary | ICD-10-CM

## 2020-10-11 DIAGNOSIS — I4891 Unspecified atrial fibrillation: Secondary | ICD-10-CM | POA: Diagnosis not present

## 2020-10-11 DIAGNOSIS — E1165 Type 2 diabetes mellitus with hyperglycemia: Secondary | ICD-10-CM | POA: Diagnosis not present

## 2020-10-11 DIAGNOSIS — S72002A Fracture of unspecified part of neck of left femur, initial encounter for closed fracture: Secondary | ICD-10-CM | POA: Diagnosis not present

## 2020-10-11 LAB — CBC
HCT: 26.5 % — ABNORMAL LOW (ref 36.0–46.0)
Hemoglobin: 8.2 g/dL — ABNORMAL LOW (ref 12.0–15.0)
MCH: 29.5 pg (ref 26.0–34.0)
MCHC: 30.9 g/dL (ref 30.0–36.0)
MCV: 95.3 fL (ref 80.0–100.0)
Platelets: 152 10*3/uL (ref 150–400)
RBC: 2.78 MIL/uL — ABNORMAL LOW (ref 3.87–5.11)
RDW: 14.9 % (ref 11.5–15.5)
WBC: 11.6 10*3/uL — ABNORMAL HIGH (ref 4.0–10.5)
nRBC: 0 % (ref 0.0–0.2)

## 2020-10-11 LAB — BASIC METABOLIC PANEL
Anion gap: 10 (ref 5–15)
BUN: 21 mg/dL (ref 8–23)
CO2: 24 mmol/L (ref 22–32)
Calcium: 9.2 mg/dL (ref 8.9–10.3)
Chloride: 107 mmol/L (ref 98–111)
Creatinine, Ser: 1 mg/dL (ref 0.44–1.00)
GFR, Estimated: 59 mL/min — ABNORMAL LOW (ref >60)
Glucose, Bld: 128 mg/dL — ABNORMAL HIGH (ref 70–99)
Potassium: 4 mmol/L (ref 3.5–5.1)
Sodium: 141 mmol/L (ref 135–145)

## 2020-10-11 LAB — GLUCOSE, CAPILLARY
Glucose-Capillary: 108 mg/dL — ABNORMAL HIGH (ref 70–99)
Glucose-Capillary: 122 mg/dL — ABNORMAL HIGH (ref 70–99)
Glucose-Capillary: 155 mg/dL — ABNORMAL HIGH (ref 70–99)

## 2020-10-11 MED ORDER — APIXABAN 2.5 MG PO TABS
2.5000 mg | ORAL_TABLET | Freq: Two times a day (BID) | ORAL | Status: DC
  Administered 2020-10-11 – 2020-10-12 (×3): 2.5 mg via ORAL
  Filled 2020-10-11 (×3): qty 1

## 2020-10-11 MED ORDER — CHLORHEXIDINE GLUCONATE CLOTH 2 % EX PADS
6.0000 | MEDICATED_PAD | Freq: Every day | CUTANEOUS | Status: DC
  Administered 2020-10-11 – 2020-10-12 (×2): 6 via TOPICAL

## 2020-10-11 MED ORDER — POLYSACCHARIDE IRON COMPLEX 150 MG PO CAPS
150.0000 mg | ORAL_CAPSULE | Freq: Every day | ORAL | Status: DC
  Administered 2020-10-11 – 2020-10-12 (×2): 150 mg via ORAL
  Filled 2020-10-11 (×2): qty 1

## 2020-10-11 MED ORDER — METOPROLOL TARTRATE 25 MG PO TABS
12.5000 mg | ORAL_TABLET | Freq: Two times a day (BID) | ORAL | Status: DC
  Administered 2020-10-11 – 2020-10-12 (×3): 12.5 mg via ORAL
  Filled 2020-10-11 (×3): qty 1

## 2020-10-11 MED ORDER — SENNOSIDES-DOCUSATE SODIUM 8.6-50 MG PO TABS
2.0000 | ORAL_TABLET | Freq: Two times a day (BID) | ORAL | Status: DC
Start: 2020-10-11 — End: 2020-10-12
  Administered 2020-10-11 – 2020-10-12 (×3): 2 via ORAL
  Filled 2020-10-11 (×3): qty 2

## 2020-10-11 NOTE — Progress Notes (Signed)
MD notified of patients HR sustaining at 126. Patient was given IV dilaudid, no new orders obtained. Will continue to monitor.

## 2020-10-11 NOTE — NC FL2 (Signed)
[place] MEDICAID FL2 LEVEL OF CARE SCREENING TOOL     IDENTIFICATION  Patient Name: Rachel Fox Birthdate: [birth year] Sex: female Admission Date (Current Location): 10/09/2020  [facility] and [place] Number:  [employer] and Address:  [facility],  [address], [place] [address]      Provider Number: [id number]  Attending Physician Name and Address:  Shon Hale, MD  Relative Name and Phone Number:  Harless Litten Niece     [phone]    Current Level of Care: Hospital Recommended Level of Care: Skilled Nursing Facility Prior Approval Number:    Date Approved/Denied: 09/22/18 PASRR Number: [id number]  Discharge Plan: SNF    Current Diagnoses: Patient Active Problem List   Diagnosis Date Noted   Hip fx (HCC) 10/09/2020   Sinus node dysfunction (HCC) 08/22/2020   Spastic hemiparesis (HCC)    Acute pain of left shoulder    Controlled type 2 diabetes mellitus with hyperglycemia, without long-term current use of insulin (HCC)    Dysphagia, post-stroke    Atrial fibrillation (HCC)    Right middle cerebral artery stroke (HCC) 09/27/2018   Cerebrovascular accident (CVA) with involvement of left side of body (HCC) 09/20/2018   Hypertension    Type 2 diabetes mellitus (HCC)    Hyperlipidemia     Orientation RESPIRATION BLADDER Height & Weight     Self, Time, Situation, Place  Normal Continent Weight: 118 lb 9.7 oz (53.8 kg) Height:  5\' 3"  (160 cm)  BEHAVIORAL SYMPTOMS/MOOD NEUROLOGICAL BOWEL NUTRITION STATUS      Continent Diet (Diet regular Room service appropriate? Yes; Fluid consistency: Thin)  AMBULATORY STATUS COMMUNICATION OF NEEDS Skin   Extensive Assist Verbally Surgical wounds (Left hip)                       Personal Care Assistance Level of Assistance  Bathing, Feeding, Dressing Bathing Assistance: Maximum assistance Feeding assistance: Independent Dressing Assistance: Maximum assistance     Functional  Limitations Info  Sight, Hearing, Speech Sight Info: Impaired Hearing Info: Adequate Speech Info: Adequate    SPECIAL CARE FACTORS FREQUENCY  PT (By licensed PT)     PT Frequency: 5x              Contractures Contractures Info: Not present    Additional Factors Info  Code Status, Allergies Code Status Info: Full Allergies Info: Lactose Intolerance           Current Medications (10/11/2020):  This is the current hospital active medication list Current Facility-Administered Medications  Medication Dose Route Frequency Provider Last Rate Last Admin   0.9 %  sodium chloride infusion  250 mL Intravenous PRN 10/13/2020, MD       acetaminophen (TYLENOL) tablet 650 mg  650 mg Oral Q6H PRN Oliver Barre, MD   650 mg at 10/11/20 10/13/20   Or   acetaminophen (TYLENOL) suppository 650 mg  650 mg Rectal Q6H PRN [id number], MD       amiodarone (PACERONE) tablet 100 mg  100 mg Oral Daily Oliver Barre A, MD   100 mg at 10/11/20 0830   apixaban (ELIQUIS) tablet 2.5 mg  2.5 mg Oral BID Emokpae, Courage, MD   2.5 mg at 10/11/20 1059   atorvastatin (LIPITOR) tablet 80 mg  80 mg Oral q1800 10/13/20, MD   80 mg at 10/10/20 1814   bisacodyl (DULCOLAX) EC tablet 5 mg  5 mg Oral Daily  PRN Oliver Barre, MD       ceFAZolin (ANCEF) IVPB 2g/100 mL premix  2 g Intravenous Q8H Thane Edu A, MD 200 mL/hr at 10/11/20 0434 2 g at 10/11/20 0434   Chlorhexidine Gluconate Cloth 2 % PADS 6 each  6 each Topical Daily Shahmehdi, Guadalupe Maple A, MD   6 each at 10/11/20 0830   fentaNYL (SUBLIMAZE) injection 50 mcg  50 mcg Intravenous Q2H PRN Oliver Barre, MD   50 mcg at 10/09/20 1849   hydrALAZINE (APRESOLINE) injection 10 mg  10 mg Intravenous Q4H PRN Oliver Barre, MD   10 mg at 10/09/20 1859   HYDROmorphone (DILAUDID) injection 0.5-1 mg  0.5-1 mg Intravenous Q2H PRN Oliver Barre, MD   1 mg at 10/11/20 0829   insulin aspart (novoLOG) injection 0-9 Units  0-9 Units Subcutaneous TID WC Oliver Barre,  MD       ipratropium (ATROVENT) nebulizer solution 0.5 mg  0.5 mg Nebulization Q6H PRN Oliver Barre, MD       levalbuterol Pauline Aus) nebulizer solution 0.63 mg  0.63 mg Nebulization Q6H PRN Oliver Barre, MD       levETIRAcetam (KEPPRA) tablet 500 mg  500 mg Oral BID Thane Edu A, MD   500 mg at 10/11/20 0830   magnesium citrate solution 1 Bottle  1 Bottle Oral Once PRN Oliver Barre, MD       ondansetron [facility]) tablet 4 mg  4 mg Oral Q6H PRN Oliver Barre, MD       Or   ondansetron [facility]) injection 4 mg  4 mg Intravenous Q6H PRN Oliver Barre, MD   4 mg at 10/09/20 2136   oxyCODONE (Oxy IR/ROXICODONE) immediate release tablet 5 mg  5 mg Oral Q4H PRN Oliver Barre, MD       potassium chloride SA (KLOR-CON) CR tablet 40 mEq  40 mEq Oral Once Oliver Barre, MD       senna-docusate (Senokot-S) tablet 1 tablet  1 tablet Oral QHS PRN Oliver Barre, MD       sodium chloride flush (NS) 0.9 % injection 3 mL  3 mL Intravenous Q12H Thane Edu A, MD   3 mL at 10/11/20 0829   sodium chloride flush (NS) 0.9 % injection 3 mL  3 mL Intravenous PRN Oliver Barre, MD       traZODone (DESYREL) tablet 25 mg  25 mg Oral QHS PRN Oliver Barre, MD   25 mg at 10/09/20 2158     Discharge Medications: Please see discharge summary for a list of discharge medications.  Relevant Imaging Results:  Relevant Lab Results:   Additional Information Pt SSN:[id number]  Barry Brunner, LCSW

## 2020-10-11 NOTE — Plan of Care (Signed)
  Problem: Acute Rehab OT Goals (only OT should resolve) Goal: Pt. Will Perform Grooming Flowsheets (Taken 10/11/2020 1034) Pt Will Perform Grooming:  with supervision  sitting  with set-up Goal: Pt. Will Perform Upper Body Dressing Flowsheets (Taken 10/11/2020 1034) Pt Will Perform Upper Body Dressing:  with min assist  sitting Goal: Pt. Will Perform Lower Body Dressing Flowsheets (Taken 10/11/2020 1034) Pt Will Perform Lower Body Dressing:  with mod assist  sitting/lateral leans  sit to/from stand  with adaptive equipment Goal: Pt. Will Transfer To Toilet Flowsheets (Taken 10/11/2020 1034) Pt Will Transfer to Toilet:  with min assist  with mod assist  squat pivot transfer  bedside commode Goal: Pt/Caregiver Will Perform Home Exercise Program Flowsheets (Taken 10/11/2020 1034) Pt/caregiver will Perform Home Exercise Program:  Increased ROM  Right Upper extremity  Increased strength  Left upper extremity  With Supervision  With minimal assist  Charlea Nardo OT, MOT

## 2020-10-11 NOTE — Evaluation (Signed)
Physical Therapy Evaluation Patient Details Name: Rachel Fox MRN: [id number] DOB: 12-02-1945 Today's Date: 10/11/2020   History of Present Illness  Rachel Fox is a 75 y.o. female s/p Left hip hemiarthroplasty on 10/10/20, with medical history significant of A. fib, on Eliquis, HTN, HLD, CVA with left-sided weakness, left arm contracture, seizures, diabetes mellitus type II, presenting with hip pain, status post fall 2 days ago.  Reports reaching out for walker, was accidental fall onto her left side on her hip.  Denies of hitting her head, or passing out..   Clinical Impression  Patient demonstrates slow labored movement for sitting up at bedside requiring frequent verbal/tactile cueing for proper hand placement of RUE due to reaching for bed rail behind her instead of pushing, scooting to edge of bed, left lateral leaning once seated, poor tolerance for weightbearing on LLE when standing and limited to a couple of shuffling side steps to transfer to chair.  Patient tolerated sitting up in chair after therapy - nursing staff notified.  Patient will benefit from continued physical therapy in hospital and recommended venue below to increase strength, balance, endurance for safe ADLs and gait.         Follow Up Recommendations SNF    Equipment Recommendations  None recommended by PT    Recommendations for Other Services       Precautions / Restrictions Precautions Precautions: Posterior Hip;Fall Precaution Booklet Issued: No Required Braces or Orthoses:  (abduction pillow) Restrictions Weight Bearing Restrictions: Yes LLE Weight Bearing: Weight bearing as tolerated Other Position/Activity Restrictions: abduction pillow      Mobility  Bed Mobility Overal bed mobility: Needs Assistance Bed Mobility: Supine to Sit     Supine to sit: Mod assist     General bed mobility comments: slow labored movement; tactile and verbal cuing with pt seeking to hold on posterior  rail.    Transfers Overall transfer level: Needs assistance Equipment used: Hemi-walker Transfers: Sit to/from [employer] Sit to Stand: Mod assist Stand pivot transfers: Mod assist       General transfer comment: poor tolerance for weightbearing on LLE due to increased pain  Ambulation/Gait Ambulation/Gait assistance: Max assist Gait Distance (Feet): 2 Feet Assistive device: Hemi-walker Gait Pattern/deviations: Decreased step length - right;Decreased step length - left;Decreased stance time - left;Decreased stride length;Antalgic Gait velocity: decreased   General Gait Details: limited to a couple of slow labored side steps at bedside due to poor standing balance, LLE weakness and pain  Stairs            Wheelchair Mobility    Modified Rankin (Stroke Patients Only)       Balance Overall balance assessment: Needs assistance Sitting-balance support: Feet supported;No upper extremity supported Sitting balance-Leahy Scale: Fair Sitting balance - Comments: seated at EOB Postural control: Left lateral lean Standing balance support: During functional activity;Single extremity supported Standing balance-Leahy Scale: Poor Standing balance comment: using Hemi-walker                             Pertinent Vitals/Pain Pain Assessment: Faces Faces Pain Scale: Hurts even more Pain Location: L hip with movement Pain Descriptors / Indicators: Grimacing;Guarding Pain Intervention(s): Limited activity within patient's tolerance;Monitored during session;Repositioned;Premedicated before session    Home Living Family/patient expects to be discharged to:: Private residence Living Arrangements: Children Available Help at Discharge: Family;Available PRN/intermittently;Personal care attendant Type of Home: House Home Access: Stairs to enter Entrance Stairs-Rails: Right;Left;Can reach  both Entrance Stairs-Number of Steps: 10 Home Layout: One  level Home Equipment: Walker - 2 wheels;Hospital bed;Wheelchair - [employer];Tub bench      Prior Function Level of Independence: Needs assistance   Gait / Transfers Assistance Needed: household ambulator with hemi-walker  ADL's / Homemaking Assistance Needed: assisted by family and care attendant        Hand Dominance   Dominant Hand: Right    Extremity/Trunk Assessment   Upper Extremity Assessment Upper Extremity Assessment: Defer to OT evaluation RUE Deficits / Details: 4/5MMT grossly. WFL A/ROM. RUE Coordination: WNL LUE Deficits / Details: Pt demonstrates trace shoulder movements with flexor tone. Limited to ~90 shoulder flexion and abduction. External rotation to neutral. 3 on modified ashworth scale with flexor tone in elbow wrist and digits. ~-50* of elbow extension. P/ROM near nutral for wrist ext. Digits very tight. ~1/2 avilalbe composit extension. LUE Coordination: decreased fine motor;decreased gross motor    Lower Extremity Assessment Lower Extremity Assessment: Generalized weakness;LLE deficits/detail LLE Deficits / Details: grossly 2+/5 LLE: Unable to fully assess due to pain LLE Sensation: WNL LLE Coordination: decreased gross motor;decreased fine motor    Cervical / Trunk Assessment Cervical / Trunk Assessment: Kyphotic  Communication   Communication: No difficulties  Cognition Arousal/Alertness: Awake/alert Behavior During Therapy: WFL for tasks assessed/performed Overall Cognitive Status: Within Functional Limits for tasks assessed                                        General Comments      Exercises     Assessment/Plan    PT Assessment Patient needs continued PT services  PT Problem List Decreased strength;Decreased activity tolerance;Decreased balance;Decreased mobility;Decreased coordination;Pain       PT Treatment Interventions DME instruction;Gait training;Stair training;Functional mobility  training;Therapeutic activities;Therapeutic exercise;Patient/family education;Balance training    PT Goals (Current goals can be found in the Care Plan section)  Acute Rehab PT Goals Patient Stated Goal: return home PT Goal Formulation: With patient Time For Goal Achievement: 10/25/20 Potential to Achieve Goals: Good    Frequency Min 4X/week   Barriers to discharge        Co-evaluation PT/OT/SLP Co-Evaluation/Treatment: Yes Reason for Co-Treatment: Complexity of the patient's impairments (multi-system involvement);To address functional/ADL transfers PT goals addressed during session: Mobility/safety with mobility;Balance;Proper use of DME OT goals addressed during session: ADL's and self-care;Strengthening/ROM       AM-PAC PT "6 Clicks" Mobility  Outcome Measure Help needed turning from your back to your side while in a flat bed without using bedrails?: A Lot Help needed moving from lying on your back to sitting on the side of a flat bed without using bedrails?: A Lot Help needed moving to and from a bed to a chair (including a wheelchair)?: A Lot Help needed standing up from a chair using your arms (e.g., wheelchair or bedside chair)?: A Lot Help needed to walk in hospital room?: Total Help needed climbing 3-5 steps with a railing? : Total 6 Click Score: 10    End of Session   Activity Tolerance: Patient tolerated treatment well;Patient limited by fatigue;Patient limited by pain Patient left: in chair;with call bell/phone within reach;with chair alarm set Nurse Communication: Mobility status PT Visit Diagnosis: Unsteadiness on feet (R26.81);Other abnormalities of gait and mobility (R26.89);Muscle weakness (generalized) (M62.81)    Time: 4818-5631 PT Time Calculation (min) (ACUTE ONLY): 23 min   Charges:  PT Evaluation $PT Eval Moderate Complexity: 1 Mod PT Treatments $Therapeutic Activity: 23-37 mins        12:20 PM, 10/11/20 Ocie Bob, MPT Physical  Therapist with [facility] [phone] office [phone] mobile phone

## 2020-10-11 NOTE — TOC Initial Note (Signed)
Transition of Care [facility]) - Initial/Assessment Note    Patient Details  Name: Rachel Fox MRN: [id number] Date of Birth: 1945-07-12  Transition of Care [facility]) CM/SW Contact:    Barry Brunner, LCSW Phone Number: 10/11/2020, 11:26 AM  Clinical Narrative:                 Patient is a 75 year old female admitted for hip fracture. Patient's niece reported that at baseline patient was able to walk with the assistance of a walker. Patient also required assistance with ADL's such as cooking and showering. Csw inquired about family's agreeableness to SNF. Patient and family agreeable to SNF referrals for [facility] SNF's. CSW faxed out patient. Kerri with [facility] made bed offer. Patient's family agreeable to [facility]. Patient is fully vaccinated for Covid 19. TOC to follow.  Expected Discharge Plan: Skilled Nursing Facility Barriers to Discharge: Continued Medical Work up   Patient Goals and CMS Choice Patient states their goals for this hospitalization and ongoing recovery are:: Rehab with SNF CMS Medicare.gov Compare Post Acute Care list provided to:: Patient Choice offered to / list presented to : Patient  Expected Discharge Plan and Services Expected Discharge Plan: Skilled Nursing Facility       Living arrangements for the past 2 months: Single Family Home                                      Prior Living Arrangements/Services Living arrangements for the past 2 months: Single Family Home Lives with:: Self Patient language and need for interpreter reviewed:: Yes Do you feel safe going back to the place where you live?: Yes      Need for Family Participation in Patient Care: Yes (Comment) Care giver support system in place?: Yes (comment)   Criminal Activity/Legal Involvement Pertinent to Current Situation/Hospitalization: No - Comment as needed  Activities of Daily Living Home Assistive Devices/Equipment: Walker (specify type) ADL Screening (condition at time of  admission) Patient's cognitive ability adequate to safely complete daily activities?: Yes Is the patient deaf or have difficulty hearing?: No Does the patient have difficulty seeing, even when wearing glasses/contacts?: No Does the patient have difficulty concentrating, remembering, or making decisions?: No Patient able to express need for assistance with ADLs?: Yes Does the patient have difficulty dressing or bathing?: Yes Independently performs ADLs?: No Communication: Independent Dressing (OT): Needs assistance Is this a change from baseline?: Pre-admission baseline Grooming: Needs assistance Is this a change from baseline?: Pre-admission baseline Feeding: Independent Bathing: Needs assistance Is this a change from baseline?: Pre-admission baseline Toileting: Needs assistance Is this a change from baseline?: Pre-admission baseline In/Out Bed: Needs assistance Is this a change from baseline?: Pre-admission baseline Walks in Home: Needs assistance Is this a change from baseline?: Pre-admission baseline Does the patient have difficulty walking or climbing stairs?: Yes Weakness of Legs: Both Weakness of Arms/Hands: Both  Permission Sought/Granted Permission sought to share information with : Facility Industrial/product designer granted to share information with : Yes, Verbal Permission Granted  Share Information with NAME: Whitebread,Jerry  Permission granted to share info w AGENCY: Local SNF's  Permission granted to share info w Relationship: Son  Permission granted to share info w Contact Information: [phone]  Emotional Assessment     Affect (typically observed): Accepting, Adaptable Orientation: : Oriented to Self, Oriented to  Time, Oriented to Situation Alcohol / Substance Use: Not Applicable  Psych Involvement: No (comment)  Admission diagnosis:  Hypokalemia [E87.6] Hip fx (HCC) [S72.009A] Contusion of right knee, initial encounter [S80.01XA] Fall, initial  encounter [W19.XXXA] Closed displaced fracture of left femoral neck (HCC) [S72.002A] Hematoma of left thigh, initial encounter [S70.12XA] Patient Active Problem List   Diagnosis Date Noted   Hip fx (HCC) 10/09/2020   Sinus node dysfunction (HCC) 08/22/2020   Spastic hemiparesis (HCC)    Acute pain of left shoulder    Controlled type 2 diabetes mellitus with hyperglycemia, without long-term current use of insulin (HCC)    Dysphagia, post-stroke    Atrial fibrillation (HCC)    Right middle cerebral artery stroke (HCC) 09/27/2018   Cerebrovascular accident (CVA) with involvement of left side of body (HCC) 09/20/2018   Hypertension    Type 2 diabetes mellitus (HCC)    Hyperlipidemia    PCP:  Benita Stabile, MD Pharmacy:   [facility] [phone] - [place], [place] - [address] [place] [address] Phone: [phone] Fax: [phone]  [facility] - [place], [place] - [address] [place] [address] Phone: [phone] Fax: [phone]     Social Determinants of Health (SDOH) Interventions    Readmission Risk Interventions No flowsheet data found.

## 2020-10-11 NOTE — Plan of Care (Signed)
  Problem: Acute Rehab PT Goals(only PT should resolve) Goal: Pt Will Go Supine/Side To Sit Outcome: Progressing Flowsheets (Taken 10/11/2020 1222) Pt will go Supine/Side to Sit:  with minimal assist  with moderate assist Goal: Patient Will Transfer Sit To/From Stand Outcome: Progressing Flowsheets (Taken 10/11/2020 1222) Patient will transfer sit to/from stand:  with minimal assist  with moderate assist Goal: Pt Will Transfer Bed To Chair/Chair To Bed Outcome: Progressing Flowsheets (Taken 10/11/2020 1222) Pt will Transfer Bed to Chair/Chair to Bed:  with min assist  with mod assist Goal: Pt Will Ambulate Outcome: Progressing Flowsheets (Taken 10/11/2020 1222) Pt will Ambulate:  15 feet  with moderate assist  with other equipment (comment) Note: Hemi-walker   12:22 PM, 10/11/20 Ocie Bob, MPT Physical Therapist with [facility] [phone] office [phone] mobile phone

## 2020-10-11 NOTE — Plan of Care (Signed)
  Problem: Health Behavior/Discharge Planning: Goal: Ability to manage health-related needs will improve Outcome: Progressing   Problem: Clinical Measurements: Goal: Will remain free from infection Outcome: Progressing   Problem: Pain Managment: Goal: General experience of comfort will improve Outcome: Progressing   Problem: Skin Integrity: Goal: Risk for impaired skin integrity will decrease Outcome: Progressing   Problem: Education: Goal: Verbalization of understanding the information provided (i.e., activity precautions, restrictions, etc) will improve Outcome: Progressing Goal: Individualized Educational Video(s) Outcome: Progressing   Problem: Clinical Measurements: Goal: Postoperative complications will be avoided or minimized Outcome: Progressing   Problem: Self-Concept: Goal: Ability to maintain and perform role responsibilities to the fullest extent possible will improve Outcome: Progressing

## 2020-10-11 NOTE — Progress Notes (Addendum)
   ORTHOPAEDIC PROGRESS NOTE  S/p Procedure(s): ARTHROPLASTY BIPOLAR HIP (HEMIARTHROPLASTY)  DOS: 10/10/20  SUBJECTIVE: Resting comfortably.  Some issues with pain and A. fib overnight.  She is currently stable.  She has not had an appetite, but has been drinking since surgery.  She reports some numbness to her left foot.  OBJECTIVE: PE:  Alert and oriented.  No acute distress  Hip abduction pillow in place. Left lateral thigh dressing is clean, dry and intact. Active dorsiflexion of the ankle and the great toe, at her baseline. Slightly decreased sensation over the dorsum of the foot, compared to baseline.  Vitals:   10/11/20 0151 10/11/20 0411  BP: (!) 108/51 (!) 118/50  Pulse: (!) 122 86  Resp:  19  Temp:  98.7 F (37.1 C)  SpO2:  98%     ASSESSMENT: Rachel Fox is a 75 y.o. female, stable following surgery.  PLAN: Weightbearing: NWB LLE Insicional and dressing care: Reinforce dressings as needed Orthopedic device(s): None VTE prophylaxis:  Resume Eliquis POD#1   Pain control: PRN; judicious use of narcotics Follow - up plan: 2 weeks after surgery  Hip abduction pillow in place at all times while she is in bed.  Okay to remove when she is out of bed. Posterior hip precautions. Physical therapy and Occupational Therapy consults.     Contact information:     Quantisha Marsicano A. Dallas Schimke, MD MS [facility] [address] [place],  [place]  [address] Phone: [phone] Fax: [phone]

## 2020-10-11 NOTE — Progress Notes (Addendum)
Patient Demographics:    Rachel Fox, is a 75 y.o. female, DOB - 15-Mar-1946, DJS:[id number]  Admit date - 10/09/2020   Admitting Physician Kendell Bane, MD  Outpatient Primary MD for the patient is Benita Stabile, MD  LOS - 2   Chief Complaint  Patient presents with   Fall        Subjective:    Rachel Fox today has no fevers, no emesis,  No chest pain,   -Patient is wondering if the neurologist Dr. Gerilyn Pilgrim can reauthorize Botox injections for her post-stroke contractures  Assessment  & Plan :    Principal Problem:   Hip fx (HCC) Active Problems:   Cerebrovascular accident (CVA) with involvement of left side of body (HCC)   Hypertension   Type 2 diabetes mellitus (HCC)   Hyperlipidemia   Controlled type 2 diabetes mellitus with hyperglycemia, without long-term current use of insulin (HCC)   Atrial fibrillation (HCC)   Sinus node dysfunction (HCC)  Brief Summary:- 75 y.o. female with medical history significant of A. fib, on Eliquis, HTN, HLD, with h/o Prior CVA with left-sided weakness, left arm contracture, seizures, diabetes mellitus type II-admitted on 10/09/2020 with left hip fracture after mechanical fall, status post ORIF on 10/10/2020 now awaiting transfer to SNF rehab  A/p  1)Lt Hip Fx--status post bipolar hip Hemi-arthroplasty on 10/10/2020 Post-operative plan: The patient will be WBAT on the operative extremity. Hip abduction pillow in place at all times when in bed Posterior hip precautions PT/OT eval noted, recommends SNF rehab DVT prophylaxis-patient on Eliquis Follow up with orthopedic team plan will be scheduled in approximately 14 days for incision check and XR.  2)Chronic Atrial Fibrillation/Status post prior Pacemaker placement for sinus node dysfunction and /history of prior CVA with residual left-sided weakness with contractures -Reached out to Dr. Gerilyn Pilgrim  patient request to possibly get him to be authorized patient's quarterly Botox injections for her contractures -Continue amiodarone for rhythm control Start Metoprolol 12.5 mg bid if BP allows -Currently on Eliquis 2.5 mg twice daily, if H&H stabilizes may increase Eliquis to 5 mg twice daily  3)Acute Anemia-- due to blood loss related to hip surgery and hip fracture - Hgb is down to 8.2, baseline hemoglobin usually above 13 -Continue to monitor closely -Give iron tablets  4)DM2-A1c is 5.6 reflecting excellent diabetic control PTA Use Novolog/Humalog Sliding scale insulin with Accu-Cheks/Fingersticks as ordered   Disposition/Need for in-Hospital Stay- patient unable to be discharged at this time due to --status post left hip surgery awaiting transfer to SNF rehab when insurance approval is obtained  Status is: Inpatient  Remains inpatient appropriate because: status post left hip surgery awaiting transfer to SNF rehab when insurance approval is obtained  Disposition: The patient is from: Home              Anticipated d/c is to: SNF              Anticipated d/c date is: 1 day              Patient currently is medically stable to d/c. Barriers: status post left hip surgery awaiting transfer to SNF rehab when insurance approval is obtained  Code Status :  -  Code Status: Full Code  Family Communication:   (patient is alert, awake and coherent)   Consults  :  ortho  DVT Prophylaxis  :   - SCDs   apixaban (ELIQUIS) tablet 2.5 mg Start: 10/11/20 1000 TED hose Start: 10/09/20 1456 SCDs Start: 10/09/20 1456 Place TED hose Start: 10/09/20 1456 apixaban (ELIQUIS) tablet 2.5 mg    Lab Results  Component Value Date   PLT 152 10/11/2020    Inpatient Medications  Scheduled Meds:  amiodarone  100 mg Oral Daily   apixaban  2.5 mg Oral BID   atorvastatin  80 mg Oral q1800   Chlorhexidine Gluconate Cloth  6 each Topical Daily   insulin aspart  0-9 Units Subcutaneous TID WC    levETIRAcetam  500 mg Oral BID   potassium chloride SA  40 mEq Oral Once   sodium chloride flush  3 mL Intravenous Q12H   Continuous Infusions:  sodium chloride     PRN Meds:.sodium chloride, acetaminophen **OR** acetaminophen, bisacodyl, fentaNYL (SUBLIMAZE) injection, hydrALAZINE, HYDROmorphone (DILAUDID) injection, ipratropium, levalbuterol, magnesium citrate, ondansetron **OR** ondansetron (ZOFRAN) IV, oxyCODONE, senna-docusate, sodium chloride flush, traZODone    Anti-infectives (From admission, onward)    Start     Dose/Rate Route Frequency Ordered Stop   10/10/20 2130  ceFAZolin (ANCEF) IVPB 2g/100 mL premix        2 g 200 mL/hr over 30 Minutes Intravenous Every 8 hours 10/10/20 1742 10/11/20 1319   10/10/20 1550  vancomycin (VANCOCIN) powder  Status:  Discontinued          As needed 10/10/20 1550 10/10/20 1741   10/10/20 1310  ceFAZolin (ANCEF) 2-4 GM/100ML-% IVPB       Note to Pharmacy: Onnie Graham   : cabinet override      10/10/20 1310 10/11/20 1319   10/10/20 1200  ceFAZolin (ANCEF) IVPB 2g/100 mL premix        2 g 200 mL/hr over 30 Minutes Intravenous On call to O.R. 10/09/20 1810 10/10/20 1409         Objective:   Vitals:   10/10/20 2111 10/11/20 0151 10/11/20 0411 10/11/20 0500  BP: (!) 107/57 (!) 108/51 (!) 118/50   Pulse: 91 (!) 122 86   Resp: 19  19   Temp: 98 F (36.7 C)  98.7 F (37.1 C)   TempSrc: Oral  Oral   SpO2: 97%  98%   Weight:    53.8 kg  Height:        Wt Readings from Last 3 Encounters:  10/11/20 53.8 kg  08/22/20 55.8 kg  08/15/20 56 kg     Intake/Output Summary (Last 24 hours) at 10/11/2020 1421 Last data filed at 10/11/2020 0500 Gross per 24 hour  Intake 1700 ml  Output 575 ml  Net 1125 ml     Physical Exam  Gen:- Awake Alert,  in no apparent distress  HEENT:- [place].AT, No sclera icterus Neck-Supple Neck,No JVD,.  Lungs-  CTAB , fair symmetrical air movement CV- S1, S2 normal, irregularly irregular , pacemaker in  situ abd-  +ve B.Sounds, Abd Soft, No tenderness,    Extremity/Skin:- No  edema, pedal pulses present  Psych-affect is appropriate, oriented x3 Neuro-residual left-sided weakness with contractures, no new focal deficits, no tremors MSK-left hip postop wound clean dry and intact   Data Review:   Micro Results Recent Results (from the past 240 hour(s))  Resp Panel by RT-PCR (Flu A&B, Covid) Nasopharyngeal Swab     Status: None   Collection Time: 10/09/20 11:34 AM  Specimen: Nasopharyngeal Swab; Nasopharyngeal(NP) swabs in vial transport medium  Result Value Ref Range Status   SARS Coronavirus 2 by RT PCR NEGATIVE NEGATIVE Final    Comment: (NOTE) SARS-CoV-2 target nucleic acids are NOT DETECTED.  The SARS-CoV-2 RNA is generally detectable in upper respiratory specimens during the acute phase of infection. The lowest concentration of SARS-CoV-2 viral copies this assay can detect is 138 copies/mL. A negative result does not preclude SARS-Cov-2 infection and should not be used as the sole basis for treatment or other patient management decisions. A negative result may occur with  improper specimen collection/handling, submission of specimen other than nasopharyngeal swab, presence of viral mutation(s) within the areas targeted by this assay, and inadequate number of viral copies(<138 copies/mL). A negative result must be combined with clinical observations, patient history, and epidemiological information. The expected result is Negative.  Fact Sheet for Patients:  BloggerCourse.com  Fact Sheet for Healthcare Providers:  SeriousBroker.it  This test is no t yet approved or cleared by the [place] FDA and  has been authorized for detection and/or diagnosis of SARS-CoV-2 by FDA under an Emergency Use Authorization (EUA). This EUA will remain  in effect (meaning this test can be used) for the duration of the COVID-19 declaration  under Section 564(b)(1) of the Act, 21 U.S.C.section 360bbb-3(b)(1), unless the authorization is terminated  or revoked sooner.       Influenza A by PCR NEGATIVE NEGATIVE Final   Influenza B by PCR NEGATIVE NEGATIVE Final    Comment: (NOTE) The Xpert Xpress SARS-CoV-2/FLU/RSV plus assay is intended as an aid in the diagnosis of influenza from Nasopharyngeal swab specimens and should not be used as a sole basis for treatment. Nasal washings and aspirates are unacceptable for Xpert Xpress SARS-CoV-2/FLU/RSV testing.  Fact Sheet for Patients: BloggerCourse.com  Fact Sheet for Healthcare Providers: SeriousBroker.it  This test is not yet approved or cleared by the [place] FDA and has been authorized for detection and/or diagnosis of SARS-CoV-2 by FDA under an Emergency Use Authorization (EUA). This EUA will remain in effect (meaning this test can be used) for the duration of the COVID-19 declaration under Section 564(b)(1) of the Act, 21 U.S.C. section 360bbb-3(b)(1), unless the authorization is terminated or revoked.  Performed at [facility], [address] [place], [place] [address]   Surgical PCR screen     Status: None   Collection Time: 10/09/20 11:44 PM   Specimen: Nasal Mucosa; Nasal Swab  Result Value Ref Range Status   MRSA, PCR NEGATIVE NEGATIVE Final   Staphylococcus aureus NEGATIVE NEGATIVE Final    Comment: (NOTE) The Xpert SA Assay (FDA approved for NASAL specimens in patients 47 years of age and older), is one component of a comprehensive surveillance program. It is not intended to diagnose infection nor to guide or monitor treatment. Performed at [facility], [address] [place], [place] [address]     Radiology Reports DG Chest 1 View  Result Date: 10/09/2020 CLINICAL DATA:  Larey Seat. Left hip fracture. EXAM: CHEST  1 VIEW COMPARISON:  08/22/2020 FINDINGS: The pacer wires are in good position,  unchanged. The cardiac silhouette, mediastinal and hilar contours are normal. The lungs are clear. The bony thorax is intact. IMPRESSION: No acute cardiopulmonary findings. Electronically Signed   By: Rudie Meyer M.D.   On: 10/09/2020 12:50   CT PELVIS WO CONTRAST  Result Date: 10/09/2020 CLINICAL DATA:  Larey Seat. Left hip pain. EXAM: CT PELVIS WITHOUT CONTRAST TECHNIQUE: Multidetector CT imaging of the  pelvis was performed following the standard protocol without intravenous contrast. COMPARISON:  None. FINDINGS: Urinary Tract:  The bladder is unremarkable. Bowel: The rectum, sigmoid colon and visualized small bowel loops are unremarkable. Vascular/Lymphatic: Vascular calcifications but no aneurysm. No adenopathy. Reproductive:  Surgically absent. Other:  No free pelvic fluid or pelvic hematoma. Musculoskeletal: As demonstrated on the radiographs there is a mildly displaced left femoral neck fracture. Mild impaction and varus deformity. The right hip is intact. The pubic symphysis and SI joints are intact. No pelvic fractures or bone lesions. Degenerative changes at the pubic symphysis and chondrocalcinosis are noted. There is a subcutaneous contusion/hematoma overlying the left hip area. No significant intramuscular hematoma. IMPRESSION: 1. Mildly displaced left femoral neck fracture. 2. No pelvic fractures or bone lesions. 3. Subcutaneous contusion/hematoma overlying the left hip area. Electronically Signed   By: Rudie MeyerP.  Gallerani M.D.   On: 10/09/2020 12:53   DG Knee Complete 4 Views Right  Result Date: 10/09/2020 CLINICAL DATA:  Larey SeatFell. Right knee pain. EXAM: RIGHT KNEE - COMPLETE 4+ VIEW COMPARISON:  None. FINDINGS: Mild degenerative changes are noted with early joint space narrowing and spurring. There is also chondrocalcinosis. No acute fracture or osteochondral lesion. No joint effusion. IMPRESSION: 1. Mild degenerative changes and chondrocalcinosis. 2. No acute bony findings or joint effusion.  Electronically Signed   By: Rudie MeyerP.  Gallerani M.D.   On: 10/09/2020 12:48   DG HIP UNILAT WITH PELVIS 2-3 VIEWS LEFT  Result Date: 10/10/2020 CLINICAL DATA:  75 year old female status post left hip arthroplasty. EXAM: DG HIP (WITH OR WITHOUT PELVIS) 2-3V LEFT COMPARISON:  Left knee radiograph dated 10/09/2020. FINDINGS: There is a total left hip arthroplasty. The arthroplasty components appear intact and in anatomic alignment. There is no acute fracture or dislocation. The bones are osteopenic. Postsurgical changes in the soft tissues of the left hip and cutaneous staples. IMPRESSION: Status post total left hip arthroplasty.  No immediate complication. Electronically Signed   By: Elgie CollardArash  Radparvar M.D.   On: 10/10/2020 17:35   DG Femur Min 2 Views Left  Result Date: 10/09/2020 CLINICAL DATA:  Larey SeatFell. Left hip pain. EXAM: LEFT FEMUR 2 VIEWS COMPARISON:  None. FINDINGS: There is a mildly displaced femoral neck fracture and mild varus deformity. No femoral shaft fracture. IMPRESSION: Mildly displaced femoral neck fracture. Electronically Signed   By: Rudie MeyerP.  Gallerani M.D.   On: 10/09/2020 12:50     CBC Recent Labs  Lab 10/09/20 1133 10/10/20 0343 10/10/20 1709 10/11/20 0541  WBC 8.4 9.9 17.5* 11.6*  HGB 13.5 11.5* 10.0* 8.2*  HCT 41.6 37.9 33.7* 26.5*  PLT 169 164 144* 152  MCV 91.2 95.2 97.1 95.3  MCH 29.6 28.9 28.8 29.5  MCHC 32.5 30.3 29.7* 30.9  RDW 14.8 14.9 14.9 14.9  LYMPHSABS 1.5  --   --   --   MONOABS 0.7  --   --   --   EOSABS 0.2  --   --   --   BASOSABS 0.0  --   --   --     Chemistries  Recent Labs  Lab 10/09/20 1133 10/10/20 0343 10/11/20 0541  NA 140 143 141  K 3.0* 3.6 4.0  CL 102 107 107  CO2 29 24 24   GLUCOSE 112* 98 128*  BUN 14 16 21   CREATININE 0.75 0.80 1.00  CALCIUM 9.7 9.6 9.2  MG 2.0  --   --   AST 31  --   --   ALT 26  --   --  ALKPHOS 59  --   --   BILITOT 0.9  --   --     ------------------------------------------------------------------------------------------------------------------ No results for input(s): CHOL, HDL, LDLCALC, TRIG, CHOLHDL, LDLDIRECT in the last 72 hours.  Lab Results  Component Value Date   HGBA1C 5.6 10/09/2020   ------------------------------------------------------------------------------------------------------------------ Recent Labs    10/09/20 1133  TSH 4.107   ------------------------------------------------------------------------------------------------------------------ No results for input(s): VITAMINB12, FOLATE, FERRITIN, TIBC, IRON, RETICCTPCT in the last 72 hours.  Coagulation profile Recent Labs  Lab 10/10/20 0343  INR 1.2    No results for input(s): DDIMER in the last 72 hours.  Cardiac Enzymes No results for input(s): CKMB, TROPONINI, MYOGLOBIN in the last 168 hours.  Invalid input(s): CK ------------------------------------------------------------------------------------------------------------------    Component Value Date/Time   BNP 31.0 10/09/2020 1133     Lam Bjorklund M.D on 10/11/2020 at 2:21 PM  Go to [email or web address] - for contact info  [facility] - Office  [phone]

## 2020-10-11 NOTE — Progress Notes (Signed)
   10/11/20 0151  Assess: MEWS Score  BP (!) 108/51  Pulse Rate (!) 122  Level of Consciousness Alert  Assess: MEWS Score  MEWS Temp 0  MEWS Systolic 0  MEWS Pulse 2  MEWS RR 0  MEWS LOC 0  MEWS Score 2  MEWS Score Color Yellow  MD notified of MEWs yellow due to HR. MD ordered to admin prescribed pain medication. Charge nurse notified. Will continue to monitor.

## 2020-10-11 NOTE — Evaluation (Signed)
Occupational Therapy Evaluation Patient Details Name: Rachel Fox MRN: [id number] DOB: [birth year] Today's Date: 10/11/2020    History of Present Illness Rachel Fox is a 75 y.o. female with medical history significant of A. fib, on Eliquis, HTN, HLD, CVA with left-sided weakness, left arm contracture, seizures, diabetes mellitus type II, presenting with hip pain, status post fall 2 days ago.  Reports reaching out for walker, was accidental fall onto her left side on her hip.  Denies of hitting her head, or passing out..   Clinical Impression   Pt agreeable to OT/PT co-evaluation. Pt does not use R UE functionally due to deficits from previous stroke. Pt required mod A for bed mobility and mod to max A for stand pivot transfer. Pt required total assist to don socks at bed level. Pt left in chair with abduction pillow in place. Pt will benefit from continued OT in the hospital and recommended venue below to increase strength, balance, and endurance for safe ADL's.     Follow Up Recommendations  SNF    Equipment Recommendations  None recommended by OT           Precautions / Restrictions Precautions Precautions: Posterior Hip;Fall Precaution Booklet Issued: No Required Braces or Orthoses:  (abduction pillow) Restrictions Weight Bearing Restrictions: Yes LLE Weight Bearing: Weight bearing as tolerated Other Position/Activity Restrictions: abduction pillow      Mobility Bed Mobility Overal bed mobility: Needs Assistance Bed Mobility: Supine to Sit     Supine to sit: Mod assist     General bed mobility comments: slow labored movement; tactile and verbal cuing with pt seeking to hold on posterior rail.    Transfers Overall transfer level: Needs assistance Equipment used: Rolling walker (2 wheeled) Transfers: Sit to/from [employer] Sit to Stand: Mod assist Stand pivot transfers: Mod assist       General transfer comment: slow labored movement;  diffiulty weight bearing on L LE.    Balance Overall balance assessment: Needs assistance Sitting-balance support: No upper extremity supported;Feet supported Sitting balance-Leahy Scale: Fair Sitting balance - Comments: seated at EOB Postural control: Left lateral lean Standing balance support: Single extremity supported;During functional activity Standing balance-Leahy Scale: Poor Standing balance comment: Poor using hemi-walker                           ADL either performed or assessed with clinical judgement   ADL Overall ADL's : Needs assistance/impaired                     Lower Body Dressing: Bed level;Total assistance Lower Body Dressing Details (indicate cue type and reason): total assist to don socks at bed level Toilet Transfer: Moderate assistance;Maximal assistance;Stand-pivot;RW Toilet Transfer Details (indicate cue type and reason): simulated via EOB to chair transfer.                 Vision Baseline Vision/History: Wears glasses Wears Glasses: At all times Patient Visual Report: No change from baseline Vision Assessment?: No apparent visual deficits (from baseline)                Pertinent Vitals/Pain Pain Assessment: Faces Faces Pain Scale: Hurts even more Pain Location: L hip with movement Pain Descriptors / Indicators: Grimacing;Guarding Pain Intervention(s): Premedicated before session;Monitored during session;Repositioned;Limited activity within patient's tolerance     Hand Dominance Right   Extremity/Trunk Assessment Upper Extremity Assessment Upper Extremity Assessment: RUE deficits/detail;LUE deficits/detail RUE Deficits /  Details: 4/5MMT grossly. WFL A/ROM. RUE Coordination: WNL LUE Deficits / Details: Pt demonstrates trace shoulder movements with flexor tone. Limited to ~90 shoulder flexion and abduction. External rotation to neutral. 3 on modified ashworth scale with flexor tone in elbow wrist and digits. ~-50* of  elbow extension. P/ROM near nutral for wrist ext. Digits very tight. ~1/2 avilalbe composit extension. LUE Coordination: decreased fine motor;decreased gross motor   Lower Extremity Assessment Lower Extremity Assessment: Defer to PT evaluation   Cervical / Trunk Assessment Cervical / Trunk Assessment: Kyphotic   Communication Communication Communication: No difficulties   Cognition Arousal/Alertness: Awake/alert Behavior During Therapy: WFL for tasks assessed/performed Overall Cognitive Status: Within Functional Limits for tasks assessed                                                      Home Living Family/patient expects to be discharged to:: Private residence Living Arrangements: Children Available Help at Discharge: Family;Available PRN/intermittently;Personal care attendant Type of Home: House Home Access: Stairs to enter [employer] of Steps: 10 Entrance Stairs-Rails: Right;Left;Can reach both Home Layout: One level     Bathroom Shower/Tub: Chief Strategy Officer: Standard     Home Equipment: Environmental consultant - 2 wheels;Hospital bed;Wheelchair - [employer];Tub bench          Prior Functioning/Environment Level of Independence: Needs assistance  Gait / Transfers Assistance Needed: household ambulator with hemi-walker ADL's / Homemaking Assistance Needed: assisted by family and care attendant            OT Problem List: Decreased strength;Decreased range of motion;Decreased activity tolerance;Impaired balance (sitting and/or standing);Decreased coordination;Decreased safety awareness      OT Treatment/Interventions: Self-care/ADL training;Therapeutic exercise;Therapeutic activities;Patient/family education;Balance training;DME and/or AE instruction    OT Goals(Current goals can be found in the care plan section) Acute Rehab OT Goals Patient Stated Goal: return home OT Goal Formulation: With patient Time For  Goal Achievement: 10/25/20 Potential to Achieve Goals: Good  OT Frequency: Min 2X/week               Co-evaluation PT/OT/SLP Co-Evaluation/Treatment: Yes Reason for Co-Treatment: Complexity of the patient's impairments (multi-system involvement)   OT goals addressed during session: ADL's and self-care;Strengthening/ROM                       End of Session Equipment Utilized During Treatment: Rolling walker  Activity Tolerance: Patient tolerated treatment well Patient left: in chair;with call bell/phone within reach;with chair alarm set  OT Visit Diagnosis: Unsteadiness on feet (R26.81);History of falling (Z91.81);Muscle weakness (generalized) (M62.81)                Time: 3875-6433 OT Time Calculation (min): 31 min Charges:  OT General Charges $OT Visit: 1 Visit OT Evaluation $OT Eval Low Complexity: 1 Low  Jaidev Sanger OT, MOT  Danie Chandler 10/11/2020, 10:25 AM

## 2020-10-12 ENCOUNTER — Inpatient Hospital Stay
Admission: RE | Admit: 2020-10-12 | Discharge: 2020-10-19 | Disposition: A | Discharge status: Other Acute Inpatient Hospital | Payer: Medicare Other | Source: Ambulatory Visit | Attending: Internal Medicine | Admitting: Internal Medicine

## 2020-10-12 DIAGNOSIS — I69354 Hemiplegia and hemiparesis following cerebral infarction affecting left non-dominant side: Secondary | ICD-10-CM | POA: Diagnosis not present

## 2020-10-12 DIAGNOSIS — D500 Iron deficiency anemia secondary to blood loss (chronic): Secondary | ICD-10-CM | POA: Diagnosis not present

## 2020-10-12 DIAGNOSIS — R195 Other fecal abnormalities: Secondary | ICD-10-CM | POA: Diagnosis not present

## 2020-10-12 DIAGNOSIS — E1165 Type 2 diabetes mellitus with hyperglycemia: Secondary | ICD-10-CM | POA: Diagnosis not present

## 2020-10-12 DIAGNOSIS — Z7901 Long term (current) use of anticoagulants: Secondary | ICD-10-CM | POA: Diagnosis not present

## 2020-10-12 DIAGNOSIS — M6281 Muscle weakness (generalized): Secondary | ICD-10-CM | POA: Diagnosis not present

## 2020-10-12 DIAGNOSIS — E876 Hypokalemia: Secondary | ICD-10-CM | POA: Diagnosis present

## 2020-10-12 DIAGNOSIS — K921 Melena: Secondary | ICD-10-CM | POA: Diagnosis present

## 2020-10-12 DIAGNOSIS — E1159 Type 2 diabetes mellitus with other circulatory complications: Secondary | ICD-10-CM | POA: Diagnosis not present

## 2020-10-12 DIAGNOSIS — K5909 Other constipation: Secondary | ICD-10-CM | POA: Diagnosis present

## 2020-10-12 DIAGNOSIS — I4821 Permanent atrial fibrillation: Secondary | ICD-10-CM | POA: Diagnosis not present

## 2020-10-12 DIAGNOSIS — R2681 Unsteadiness on feet: Secondary | ICD-10-CM | POA: Diagnosis not present

## 2020-10-12 DIAGNOSIS — R911 Solitary pulmonary nodule: Secondary | ICD-10-CM | POA: Diagnosis not present

## 2020-10-12 DIAGNOSIS — M25552 Pain in left hip: Secondary | ICD-10-CM | POA: Diagnosis not present

## 2020-10-12 DIAGNOSIS — I639 Cerebral infarction, unspecified: Secondary | ICD-10-CM | POA: Diagnosis not present

## 2020-10-12 DIAGNOSIS — E785 Hyperlipidemia, unspecified: Secondary | ICD-10-CM | POA: Diagnosis present

## 2020-10-12 DIAGNOSIS — S72002A Fracture of unspecified part of neck of left femur, initial encounter for closed fracture: Secondary | ICD-10-CM | POA: Diagnosis not present

## 2020-10-12 DIAGNOSIS — Z23 Encounter for immunization: Secondary | ICD-10-CM | POA: Diagnosis not present

## 2020-10-12 DIAGNOSIS — Z9181 History of falling: Secondary | ICD-10-CM | POA: Diagnosis not present

## 2020-10-12 DIAGNOSIS — R918 Other nonspecific abnormal finding of lung field: Secondary | ICD-10-CM | POA: Diagnosis not present

## 2020-10-12 DIAGNOSIS — I480 Paroxysmal atrial fibrillation: Secondary | ICD-10-CM | POA: Diagnosis not present

## 2020-10-12 DIAGNOSIS — I10 Essential (primary) hypertension: Secondary | ICD-10-CM | POA: Diagnosis present

## 2020-10-12 DIAGNOSIS — R41841 Cognitive communication deficit: Secondary | ICD-10-CM | POA: Diagnosis not present

## 2020-10-12 DIAGNOSIS — I700 Atherosclerosis of aorta: Secondary | ICD-10-CM | POA: Diagnosis not present

## 2020-10-12 DIAGNOSIS — Z8249 Family history of ischemic heart disease and other diseases of the circulatory system: Secondary | ICD-10-CM | POA: Diagnosis not present

## 2020-10-12 DIAGNOSIS — D62 Acute posthemorrhagic anemia: Secondary | ICD-10-CM | POA: Diagnosis present

## 2020-10-12 DIAGNOSIS — M79622 Pain in left upper arm: Secondary | ICD-10-CM | POA: Diagnosis not present

## 2020-10-12 DIAGNOSIS — E739 Lactose intolerance, unspecified: Secondary | ICD-10-CM | POA: Diagnosis present

## 2020-10-12 DIAGNOSIS — I69391 Dysphagia following cerebral infarction: Secondary | ICD-10-CM | POA: Diagnosis not present

## 2020-10-12 DIAGNOSIS — G8112 Spastic hemiplegia affecting left dominant side: Secondary | ICD-10-CM | POA: Diagnosis not present

## 2020-10-12 DIAGNOSIS — E1169 Type 2 diabetes mellitus with other specified complication: Secondary | ICD-10-CM | POA: Diagnosis present

## 2020-10-12 DIAGNOSIS — R569 Unspecified convulsions: Secondary | ICD-10-CM | POA: Diagnosis not present

## 2020-10-12 DIAGNOSIS — S7012XD Contusion of left thigh, subsequent encounter: Secondary | ICD-10-CM | POA: Diagnosis not present

## 2020-10-12 DIAGNOSIS — R279 Unspecified lack of coordination: Secondary | ICD-10-CM | POA: Diagnosis not present

## 2020-10-12 DIAGNOSIS — I4891 Unspecified atrial fibrillation: Secondary | ICD-10-CM | POA: Diagnosis not present

## 2020-10-12 DIAGNOSIS — W19XXXD Unspecified fall, subsequent encounter: Secondary | ICD-10-CM | POA: Diagnosis present

## 2020-10-12 DIAGNOSIS — E7849 Other hyperlipidemia: Secondary | ICD-10-CM | POA: Diagnosis not present

## 2020-10-12 DIAGNOSIS — I152 Hypertension secondary to endocrine disorders: Secondary | ICD-10-CM | POA: Diagnosis not present

## 2020-10-12 DIAGNOSIS — E7800 Pure hypercholesterolemia, unspecified: Secondary | ICD-10-CM | POA: Diagnosis present

## 2020-10-12 DIAGNOSIS — S301XXA Contusion of abdominal wall, initial encounter: Secondary | ICD-10-CM | POA: Diagnosis not present

## 2020-10-12 DIAGNOSIS — M62422 Contracture of muscle, left upper arm: Secondary | ICD-10-CM | POA: Diagnosis not present

## 2020-10-12 DIAGNOSIS — D649 Anemia, unspecified: Secondary | ICD-10-CM | POA: Diagnosis not present

## 2020-10-12 DIAGNOSIS — Z823 Family history of stroke: Secondary | ICD-10-CM | POA: Diagnosis not present

## 2020-10-12 DIAGNOSIS — S72002D Fracture of unspecified part of neck of left femur, subsequent encounter for closed fracture with routine healing: Secondary | ICD-10-CM | POA: Diagnosis not present

## 2020-10-12 DIAGNOSIS — G8114 Spastic hemiplegia affecting left nondominant side: Secondary | ICD-10-CM | POA: Diagnosis not present

## 2020-10-12 DIAGNOSIS — Z950 Presence of cardiac pacemaker: Secondary | ICD-10-CM | POA: Diagnosis not present

## 2020-10-12 DIAGNOSIS — Z87891 Personal history of nicotine dependence: Secondary | ICD-10-CM | POA: Diagnosis not present

## 2020-10-12 DIAGNOSIS — Z79899 Other long term (current) drug therapy: Secondary | ICD-10-CM | POA: Diagnosis not present

## 2020-10-12 DIAGNOSIS — K2289 Other specified disease of esophagus: Secondary | ICD-10-CM | POA: Diagnosis not present

## 2020-10-12 DIAGNOSIS — I482 Chronic atrial fibrillation, unspecified: Secondary | ICD-10-CM | POA: Diagnosis present

## 2020-10-12 DIAGNOSIS — R1310 Dysphagia, unspecified: Secondary | ICD-10-CM | POA: Diagnosis present

## 2020-10-12 DIAGNOSIS — J9811 Atelectasis: Secondary | ICD-10-CM | POA: Diagnosis not present

## 2020-10-12 DIAGNOSIS — G40909 Epilepsy, unspecified, not intractable, without status epilepticus: Secondary | ICD-10-CM | POA: Diagnosis present

## 2020-10-12 DIAGNOSIS — G8110 Spastic hemiplegia affecting unspecified side: Secondary | ICD-10-CM | POA: Diagnosis not present

## 2020-10-12 DIAGNOSIS — Z20822 Contact with and (suspected) exposure to covid-19: Secondary | ICD-10-CM | POA: Diagnosis present

## 2020-10-12 DIAGNOSIS — K922 Gastrointestinal hemorrhage, unspecified: Secondary | ICD-10-CM | POA: Diagnosis not present

## 2020-10-12 DIAGNOSIS — I495 Sick sinus syndrome: Secondary | ICD-10-CM | POA: Diagnosis not present

## 2020-10-12 DIAGNOSIS — I63511 Cerebral infarction due to unspecified occlusion or stenosis of right middle cerebral artery: Secondary | ICD-10-CM | POA: Diagnosis not present

## 2020-10-12 DIAGNOSIS — Z96642 Presence of left artificial hip joint: Secondary | ICD-10-CM | POA: Diagnosis present

## 2020-10-12 DIAGNOSIS — S8001XD Contusion of right knee, subsequent encounter: Secondary | ICD-10-CM | POA: Diagnosis not present

## 2020-10-12 LAB — BASIC METABOLIC PANEL
Anion gap: 8 (ref 5–15)
BUN: 24 mg/dL — ABNORMAL HIGH (ref 8–23)
CO2: 24 mmol/L (ref 22–32)
Calcium: 9.3 mg/dL (ref 8.9–10.3)
Chloride: 104 mmol/L (ref 98–111)
Creatinine, Ser: 0.86 mg/dL (ref 0.44–1.00)
GFR, Estimated: >60 mL/min (ref >60)
Glucose, Bld: 108 mg/dL — ABNORMAL HIGH (ref 70–99)
Potassium: 3.9 mmol/L (ref 3.5–5.1)
Sodium: 136 mmol/L (ref 135–145)

## 2020-10-12 LAB — GLUCOSE, CAPILLARY
Glucose-Capillary: 91 mg/dL (ref 70–99)
Glucose-Capillary: 120 mg/dL — ABNORMAL HIGH (ref 70–99)

## 2020-10-12 LAB — CBC
HCT: 25.5 % — ABNORMAL LOW (ref 36.0–46.0)
Hemoglobin: 8 g/dL — ABNORMAL LOW (ref 12.0–15.0)
MCH: 29 pg (ref 26.0–34.0)
MCHC: 31.4 g/dL (ref 30.0–36.0)
MCV: 92.4 fL (ref 80.0–100.0)
Platelets: 154 10*3/uL (ref 150–400)
RBC: 2.76 MIL/uL — ABNORMAL LOW (ref 3.87–5.11)
RDW: 14.8 % (ref 11.5–15.5)
WBC: 10.6 10*3/uL — ABNORMAL HIGH (ref 4.0–10.5)
nRBC: 0 % (ref 0.0–0.2)

## 2020-10-12 MED ORDER — FERROUS SULFATE 325 (65 FE) MG PO TBEC: 325.0000 mg | DELAYED_RELEASE_TABLET | Freq: Every day | ORAL | 3 refills | Status: DC

## 2020-10-12 MED ORDER — BISACODYL 10 MG RE SUPP: 10.0000 mg | RECTAL | 2 refills | Status: DC

## 2020-10-12 MED ORDER — OXYCODONE HCL 5 MG PO TABS: 5.0000 mg | ORAL_TABLET | Freq: Four times a day (QID) | ORAL | 0 refills | Status: DC | PRN

## 2020-10-12 MED ORDER — APIXABAN 5 MG PO TABS: 5.0000 mg | ORAL_TABLET | Freq: Two times a day (BID) | ORAL | 11 refills | Status: DC

## 2020-10-12 MED ORDER — METOPROLOL TARTRATE 25 MG PO TABS
12.5000 mg | ORAL_TABLET | Freq: Two times a day (BID) | ORAL | 3 refills | Status: DC
Start: 2020-10-12 — End: 2021-04-10

## 2020-10-12 MED ORDER — SENNOSIDES-DOCUSATE SODIUM 8.6-50 MG PO TABS: 2.0000 | ORAL_TABLET | Freq: Two times a day (BID) | ORAL | 2 refills | Status: DC

## 2020-10-12 NOTE — Discharge Instructions (Signed)
1)You are taking Eliquis/apixaban which is your blood thinner so please Avoid ibuprofen/Advil/Aleve/Motrin/Goody Powders/Naproxen/BC powders/Meloxicam/Diclofenac/Indomethacin and other Nonsteroidal anti-inflammatory medications as these will make you more likely to bleed and can cause stomach ulcers, can also cause Kidney problems.   2)Repeat CBC blood test on Monday, 10/15/2020 advised  3)Follow up with Dr Gerilyn Pilgrim--- for your monthly Botox injections  4)Follow up with orthopedic team (Dr Dallas Schimke) in approximately 10 days for incision check and  Lt Hip XRay.

## 2020-10-12 NOTE — Progress Notes (Signed)
   ORTHOPAEDIC PROGRESS NOTE  S/p Procedure(s): ARTHROPLASTY BIPOLAR HIP (HEMIARTHROPLASTY)  DOS: 10/10/20  SUBJECTIVE: No issues over night.  Some pain in her hip.  Some numbness to her left foot.  Worked with PT yesterday.   OBJECTIVE: PE:  Alert and oriented.  No acute distress  Hip abduction pillow in place. Left lateral thigh dressing is clean, dry and intact. Active dorsiflexion of the ankle and the great toe, at her baseline. Slightly decreased sensation over the dorsum of the foot, compared to baseline.  Vitals:   10/12/20 0549 10/12/20 1405  BP: 137/90 96/61  Pulse: 76 (!) 126  Resp: 19   Temp: 98.1 F (36.7 C) 98 F (36.7 C)  SpO2: 100% 99%     ASSESSMENT: Rachel Fox is a 75 y.o. female, stable following surgery.  OK for Dc from orthopaedics perspective.    PLAN: Weightbearing: NWB LLE Insicional and dressing care: Reinforce dressings as needed Orthopedic device(s): None VTE prophylaxis:  Resume Eliquis POD#1   Pain control: PRN; judicious use of narcotics Follow - up plan: 2 weeks after surgery  Hip abduction pillow in place at all times while she is in bed.  Okay to remove when she is out of bed. Posterior hip precautions. Physical therapy and Occupational Therapy consults.     Contact information:     Aamira Bischoff A. Dallas Schimke, MD MS [facility] [address] [place],  [place]  [address] Phone: [phone] Fax: [phone]

## 2020-10-12 NOTE — Progress Notes (Signed)
Physical Therapy Treatment Patient Details Name: Rachel Fox MRN: [id number] DOB: [birth year] Today's Date: 10/12/2020    History of Present Illness Rachel Fox is a 75 y.o. female s/p Left hip hemiarthroplasty on 10/10/20, with medical history significant of A. fib, on Eliquis, HTN, HLD, CVA with left-sided weakness, left arm contracture, seizures, diabetes mellitus type II, presenting with hip pain, status post fall 2 days ago.  Reports reaching out for walker, was accidental fall onto her left side on her hip.  Denies of hitting her head, or passing out..    PT Comments    Patient demonstrates fair/good return for using RUE to help pull herself to sitting using bed rail with Mod/max assist, tolerated completing BLE exercises while seated at bedside, imited use of LLE due to weakness from old CVA and c/o severe pain left hip with movement.  Patient required constant verbal/tactile to grip and lean on Hemi-walker when attempting transfers with poor carryover due to c/o of LLE pain when weightbearing.  Patient tolerated sitting up in chair after therapy - nursing staff aware.  Patient states she wears an KAFO for LLE at home and encouraged to have family bring it to hospital to be used for rehab at [facility].  Patient will benefit from continued physical therapy in hospital and recommended venue below to increase strength, balance, endurance for safe ADLs and gait.    Follow Up Recommendations  SNF     Equipment Recommendations  None recommended by PT    Recommendations for Other Services       Precautions / Restrictions Precautions Precautions: Posterior Hip;Fall Precaution Booklet Issued: No Restrictions Weight Bearing Restrictions: Yes LLE Weight Bearing: Weight bearing as tolerated Other Position/Activity Restrictions: abduction pillow    Mobility  Bed Mobility Overal bed mobility: Needs Assistance Bed Mobility: Supine to Sit     Supine to sit: Mod assist;Max assist      General bed mobility comments: increased time, labored movement, good return for use of bed rail with RUE    Transfers Overall transfer level: Needs assistance Equipment used: Hemi-walker Transfers: Sit to/from [employer] Sit to Stand: Mod assist Stand pivot transfers: Mod assist;Max assist       General transfer comment: poor tolerance for weightbearing on LLE due to increased pain  Ambulation/Gait Ambulation/Gait assistance: Max assist Gait Distance (Feet): 1 Feet   Gait Pattern/deviations: Decreased step length - right;Decreased step length - left;Decreased stance time - left;Decreased stride length;Antalgic Gait velocity: decreased   General Gait Details: limited to one shuffling side steps at bedside due to poor standing balance, and constant verbal/tactile cueing to keep right hand on Hemi-walker with poor carryover   Stairs             Wheelchair Mobility    Modified Rankin (Stroke Patients Only)       Balance Overall balance assessment: Needs assistance Sitting-balance support: Feet supported;Single extremity supported Sitting balance-Leahy Scale: Fair Sitting balance - Comments: seated at EOB   Standing balance support: During functional activity;Single extremity supported Standing balance-Leahy Scale: Poor Standing balance comment: using Hemi-walker                            Cognition Arousal/Alertness: Awake/alert Behavior During Therapy: WFL for tasks assessed/performed Overall Cognitive Status: Within Functional Limits for tasks assessed  Exercises General Exercises - Lower Extremity Ankle Circles/Pumps: Seated;AROM;Strengthening;Right Long Arc Quad: Seated;AROM;AAROM;Strengthening;Both;10 reps Hip Flexion/Marching: Seated;AROM;Strengthening;AAROM;Both;10 reps    General Comments        Pertinent Vitals/Pain Pain Assessment: Faces Faces Pain Scale:  Hurts even more Pain Location: L hip with movement Pain Descriptors / Indicators: Grimacing;Guarding Pain Intervention(s): Limited activity within patient's tolerance;Monitored during session;Premedicated before session;Repositioned    Home Living                      Prior Function            PT Goals (current goals can now be found in the care plan section) Acute Rehab PT Goals Patient Stated Goal: return home PT Goal Formulation: With patient Time For Goal Achievement: 10/25/20 Potential to Achieve Goals: Good Progress towards PT goals: Progressing toward goals    Frequency    Min 4X/week      PT Plan Current plan remains appropriate    Co-evaluation              AM-PAC PT "6 Clicks" Mobility   Outcome Measure  Help needed turning from your back to your side while in a flat bed without using bedrails?: A Lot Help needed moving from lying on your back to sitting on the side of a flat bed without using bedrails?: A Lot Help needed moving to and from a bed to a chair (including a wheelchair)?: A Lot Help needed standing up from a chair using your arms (e.g., wheelchair or bedside chair)?: A Lot Help needed to walk in hospital room?: Total Help needed climbing 3-5 steps with a railing? : Total 6 Click Score: 10    End of Session   Activity Tolerance: Patient tolerated treatment well;Patient limited by fatigue;Patient limited by pain Patient left: in chair;with call bell/phone within reach;with chair alarm set Nurse Communication: Mobility status PT Visit Diagnosis: Unsteadiness on feet (R26.81);Other abnormalities of gait and mobility (R26.89);Muscle weakness (generalized) (M62.81)     Time: 2202-5427 PT Time Calculation (min) (ACUTE ONLY): 22 min  Charges:  $Therapeutic Exercise: 8-22 mins $Therapeutic Activity: 8-22 mins                     1:53 PM, 10/12/20 Ocie Bob, MPT Physical Therapist with [facility] 336  [phone] office [phone] mobile phone

## 2020-10-12 NOTE — TOC Transition Note (Signed)
Transition of Care [facility]) - CM/SW Discharge Note   Patient Details  Name: MIKAELLA ESCALONA MRN: [id number] Date of Birth: 1945-07-28  Transition of Care [facility]) CM/SW Contact:  Barry Brunner, LCSW Phone Number: 10/12/2020, 11:41 AM   Clinical Narrative:    CSW notified of patient's readiness for discharge. Kerri with [facility] agreeable to take patient on 10/12/2020. Nurse to call report. [facility] to transport patient. TOC signing off.    Final next level of care: Skilled Nursing Facility Barriers to Discharge: Barriers Resolved   Patient Goals and CMS Choice Patient states their goals for this hospitalization and ongoing recovery are:: rehab with SNF CMS Medicare.gov Compare Post Acute Care list provided to:: Patient Choice offered to / list presented to : Patient, Adult Children  Discharge Placement              Patient chooses bed at: [facility] Patient to be transferred to facility by: [facility] Name of family member notified: Harless Litten Niece     [phone] Patient and family notified of of transfer: 10/12/20  Discharge Plan and Services                                     Social Determinants of Health (SDOH) Interventions     Readmission Risk Interventions No flowsheet data found.

## 2020-10-12 NOTE — Plan of Care (Signed)
  Problem: Health Behavior/Discharge Planning: Goal: Ability to manage health-related needs will improve 10/12/2020 1141 by Sheela Stack, RN Outcome: Adequate for Discharge 10/12/2020 1141 by Sheela Stack, RN Outcome: Adequate for Discharge   Problem: Clinical Measurements: Goal: Will remain free from infection 10/12/2020 1141 by Sheela Stack, RN Outcome: Adequate for Discharge 10/12/2020 1141 by Sheela Stack, RN Outcome: Adequate for Discharge   Problem: Pain Managment: Goal: General experience of comfort will improve 10/12/2020 1141 by Sheela Stack, RN Outcome: Adequate for Discharge 10/12/2020 1141 by Sheela Stack, RN Outcome: Adequate for Discharge   Problem: Skin Integrity: Goal: Risk for impaired skin integrity will decrease 10/12/2020 1141 by Sheela Stack, RN Outcome: Adequate for Discharge 10/12/2020 1141 by Sheela Stack, RN Outcome: Adequate for Discharge   Problem: Education: Goal: Verbalization of understanding the information provided (i.e., activity precautions, restrictions, etc) will improve 10/12/2020 1141 by Sheela Stack, RN Outcome: Adequate for Discharge 10/12/2020 1141 by Sheela Stack, RN Outcome: Adequate for Discharge Goal: Individualized Educational Video(s) 10/12/2020 1141 by Sheela Stack, RN Outcome: Adequate for Discharge 10/12/2020 1141 by Sheela Stack, RN Outcome: Adequate for Discharge   Problem: Clinical Measurements: Goal: Postoperative complications will be avoided or minimized 10/12/2020 1141 by Sheela Stack, RN Outcome: Adequate for Discharge 10/12/2020 1141 by Sheela Stack, RN Outcome: Adequate for Discharge   Problem: Self-Concept: Goal: Ability to maintain and perform role responsibilities to the fullest extent possible will improve 10/12/2020 1141 by Sheela Stack, RN Outcome: Adequate for Discharge 10/12/2020 1141 by Sheela Stack, RN Outcome: Adequate for Discharge

## 2020-10-12 NOTE — Discharge Summary (Addendum)
Rachel Fox, is a 75 y.o. female  DOB 1945/05/03  MRN [id number].  Admission date:  10/09/2020  Admitting Physician  Kendell Bane, MD  Discharge Date:  10/12/2020   Primary MD  Benita Stabile, MD  Recommendations for primary care physician for things to follow:   1)You are taking Eliquis/apixaban which is your blood thinner so please Avoid ibuprofen/Advil/Aleve/Motrin/Goody Powders/Naproxen/BC powders/Meloxicam/Diclofenac/Indomethacin and other Nonsteroidal anti-inflammatory medications as these will make you more likely to bleed and can cause stomach ulcers, can also cause Kidney problems.   2)Repeat CBC blood test on Monday, 10/15/2020 advised  3)Follow up with Dr Gerilyn Pilgrim--- for your monthly Botox injections  4)Follow up with orthopedic team (Dr Dallas Schimke) in approximately 10 days for incision check and  Lt Hip XRay.   Admission Diagnosis  Hypokalemia [E87.6] Hip fx (HCC) [S72.009A] Contusion of right knee, initial encounter [S80.01XA] Fall, initial encounter [W19.XXXA] Closed displaced fracture of left femoral neck (HCC) [S72.002A] Hematoma of left thigh, initial encounter [S70.12XA]   Discharge Diagnosis  Hypokalemia [E87.6] Hip fx (HCC) [S72.009A] Contusion of right knee, initial encounter [S80.01XA] Fall, initial encounter [W19.XXXA] Closed displaced fracture of left femoral neck (HCC) [S72.002A] Hematoma of left thigh, initial encounter [S70.12XA]    Principal Problem:   Hip fx (HCC) Active Problems:   Cerebrovascular accident (CVA) with involvement of left side of body (HCC)   Hypertension   Type 2 diabetes mellitus (HCC)   Hyperlipidemia   Controlled type 2 diabetes mellitus with hyperglycemia, without long-term current use of insulin (HCC)   Atrial fibrillation (HCC)   Sinus node dysfunction (HCC)      Past Medical History:  Diagnosis Date   Atrial fibrillation (HCC)    CVA  (cerebral vascular accident) (HCC)    High cholesterol    Hypertension    Seizures (HCC)    Type 2 diabetes mellitus (HCC)     Past Surgical History:  Procedure Laterality Date   ABDOMINAL HYSTERECTOMY     HIP ARTHROPLASTY Left 10/10/2020   Procedure: ARTHROPLASTY BIPOLAR HIP (HEMIARTHROPLASTY);  Surgeon: Oliver Barre, MD;  Location: [facility];  Service: Orthopedics;  Laterality: Left;   PACEMAKER IMPLANT N/A 08/22/2020   Procedure: PACEMAKER IMPLANT;  Surgeon: Marinus Maw, MD;  Location: [facility];  Service: Cardiovascular;  Laterality: N/A;   TUBAL LIGATION         HPI  from the history and physical done on the day of admission:    Rachel Fox is a 75 y.o. female with medical history significant of A. fib, on Eliquis, HTN, HLD, CVA with left-sided weakness, left arm contracture, seizures, diabetes mellitus type II, presenting with hip pain, status post fall 2 days ago. Reports reaching out for walker, was accidental fall onto her left side on her hip. Denies of hitting her head, or passing out..      Patient Denies having: Fever, Chills, Cough, SOB, Chest Pain, Abd pain, N/V/D, headache, dizziness, lightheadedness,  Dysuria, Joint pain, rash, open wounds   ED Course:  Blood pressure (!) 162/85, pulse 74, temperature 98.8 F (37.1 C), temperature source Oral, resp. rate 17, height  (1.6 m), weight 55.8 kg, SpO2 100 %.   Abnormal labs; CBC CMP within normal notes exception of potassium 3.0, glucose 112 CT pelvis without contrast - 1. Mildly displaced left femoral neck fracture. 2. No pelvic fractures or bone lesions. 3. Subcutaneous contusion/hematoma overlying the left hip area.   Right knee x-ray-degenerative changes, negative any fractures or effusion Left knee femur x-ray-  Mildly displaced femoral neck fracture.      Hospital Course:    Brief Summary:- 75 y.o. female with medical history significant of A. fib, on Eliquis, HTN, HLD, with h/o Prior  CVA with left-sided weakness, left arm contracture, seizures, diabetes mellitus type II-admitted on 10/09/2020 with left hip fracture after mechanical fall, status post ORIF on 10/10/2020 now awaiting transfer to SNF rehab   A/p  1)Lt Hip Fx--status post bipolar hip Hemi-arthroplasty on 10/10/2020 Post-operative plan: The patient will be WBAT on the operative extremity. Hip abduction pillow in place at all times when in bed Posterior hip precautions PT/OT eval noted, recommends SNF rehab DVT prophylaxis-patient on Eliquis Follow up with orthopedic team plan will be scheduled in approximately 14 days from date of surgery for incision check and XR.   2)Chronic Atrial Fibrillation/Status post prior Pacemaker placement for sinus node dysfunction and /history of prior CVA with residual left-sided weakness with contractures -Reached out to Dr. Gerilyn Pilgrim patient request to possibly get him to be authorized patient's quarterly Botox injections for her contractures -Continue amiodarone for rhythm control Start Metoprolol 12.5 mg bid for tachycardia if BP allows -Eliquis  5 mg twice daily   3)Acute Anemia-- due to blood loss related to hip surgery and hip fracture - Hgb is down to 8.0, baseline hemoglobin usually above 13 Repeat CBC 10/12/20   4)DM2-A1c is 5.6 reflecting excellent diabetic control PTA Diet and lifestyle controlled   Disposition-- transfer to SNF rehab      Disposition: The patient is from: Home              Anticipated d/c is to: SNF                Code Status :  -  Code Status: Full Code    Family Communication:   (patient is alert, awake and coherent)   Consults  :  ortho  Discharge Condition: stable  Follow UP   Contact information for follow-up providers     Benita Stabile, MD .   Specialty: Internal Medicine Contact information: [address] [place] [facility] [address] [phone]         Oliver Barre, MD. Schedule an appointment as soon as possible for a  visit in 10 day(s).   Specialties: Orthopedic Surgery, Sports Medicine Contact information: [address] [place] [address] [phone]              Contact information for after-discharge care     Destination     [facility] .   Service: Skilled Nursing Contact information: [address] [place] [address] [phone]                     Diet and Activity recommendation:  As advised  Discharge Instructions    Discharge Instructions     Call MD for:  difficulty breathing, headache or visual disturbances   Complete by: As directed  Call MD for:  persistant dizziness or light-headedness   Complete by: As directed    Call MD for:  persistant nausea and vomiting   Complete by: As directed    Call MD for:  redness, tenderness, or signs of infection (pain, swelling, redness, odor or green/yellow discharge around incision site)   Complete by: As directed    Call MD for:  severe uncontrolled pain   Complete by: As directed    Call MD for:  temperature >100.4   Complete by: As directed    Diet - low sodium heart healthy   Complete by: As directed    Discharge instructions   Complete by: As directed    1)You are taking Eliquis/apixaban which is your blood thinner so please Avoid ibuprofen/Advil/Aleve/Motrin/Goody Powders/Naproxen/BC powders/Meloxicam/Diclofenac/Indomethacin and other Nonsteroidal anti-inflammatory medications as these will make you more likely to bleed and can cause stomach ulcers, can also cause Kidney problems.   2)Repeat CBC blood test on Monday, 10/15/2020 advised  3)Follow up with Dr Gerilyn Pilgrim--- for your monthly Botox injections  4)Follow up with orthopedic team (Dr Dallas Schimke) in approximately 10 days for incision check and  Lt Hip XRay.   Discharge wound care:   Complete by: As directed    Keep left hip wound clean and dry   Increase activity slowly   Complete by: As directed         Discharge Medications     Allergies as of 10/12/2020       Reactions   Lactose Intolerance (gi) Other (See Comments)   Causes Gas in patient.        Medication List     STOP taking these medications    losartan 25 MG tablet Commonly known as: COZAAR       TAKE these medications    acetaminophen 500 MG tablet Commonly known as: TYLENOL Take 1,000 mg by mouth every 6 (six) hours as needed for moderate pain or headache.   amiodarone 100 MG tablet Commonly known as: PACERONE Take 1 tablet (100 mg total) by mouth daily.   apixaban 5 MG Tabs tablet Commonly known as: ELIQUIS Take 1 tablet (5 mg total) by mouth 2 (two) times daily. Start taking on: October 13, 2020   atorvastatin 80 MG tablet Commonly known as: LIPITOR Take 1 tablet (80 mg total) by mouth daily at 6 PM.   bisacodyl 10 MG suppository Commonly known as: Dulcolax Place 1 suppository (10 mg total) rectally every Monday, Wednesday, and Friday.   Botox 100 units Solr injection Generic drug: botulinum toxin Type A Inject 100 Units into the muscle every 3 (three) months.   ferrous sulfate 325 (65 FE) MG EC tablet Take 1 tablet (325 mg total) by mouth daily with breakfast.   levETIRAcetam 500 MG tablet Commonly known as: KEPPRA Take 500 mg by mouth 2 (two) times daily.   metoprolol tartrate 25 MG tablet Commonly known as: LOPRESSOR Take 0.5 tablets (12.5 mg total) by mouth 2 (two) times daily.   oxyCODONE 5 MG immediate release tablet Commonly known as: Oxy IR/ROXICODONE Take 1 tablet (5 mg total) by mouth every 6 (six) hours as needed for up to 5 days for moderate pain.   senna-docusate 8.6-50 MG tablet Commonly known as: Senokot-S Take 2 tablets by mouth 2 (two) times daily.               Discharge Care Instructions  (From admission, onward)           Start  Ordered   10/12/20 0000  Discharge wound care:       Comments: Keep left hip wound clean and dry   10/12/20 1517             Major procedures and Radiology Reports - PLEASE review detailed and final reports for all details, in brief -   DG Chest 1 View  Result Date: 10/09/2020 CLINICAL DATA:  Larey Seat. Left hip fracture. EXAM: CHEST  1 VIEW COMPARISON:  08/22/2020 FINDINGS: The pacer wires are in good position, unchanged. The cardiac silhouette, mediastinal and hilar contours are normal. The lungs are clear. The bony thorax is intact. IMPRESSION: No acute cardiopulmonary findings. Electronically Signed   By: Rudie Meyer M.D.   On: 10/09/2020 12:50   CT PELVIS WO CONTRAST  Result Date: 10/09/2020 CLINICAL DATA:  Larey Seat. Left hip pain. EXAM: CT PELVIS WITHOUT CONTRAST TECHNIQUE: Multidetector CT imaging of the pelvis was performed following the standard protocol without intravenous contrast. COMPARISON:  None. FINDINGS: Urinary Tract:  The bladder is unremarkable. Bowel: The rectum, sigmoid colon and visualized small bowel loops are unremarkable. Vascular/Lymphatic: Vascular calcifications but no aneurysm. No adenopathy. Reproductive:  Surgically absent. Other:  No free pelvic fluid or pelvic hematoma. Musculoskeletal: As demonstrated on the radiographs there is a mildly displaced left femoral neck fracture. Mild impaction and varus deformity. The right hip is intact. The pubic symphysis and SI joints are intact. No pelvic fractures or bone lesions. Degenerative changes at the pubic symphysis and chondrocalcinosis are noted. There is a subcutaneous contusion/hematoma overlying the left hip area. No significant intramuscular hematoma. IMPRESSION: 1. Mildly displaced left femoral neck fracture. 2. No pelvic fractures or bone lesions. 3. Subcutaneous contusion/hematoma overlying the left hip area. Electronically Signed   By: Rudie Meyer M.D.   On: 10/09/2020 12:53   DG Knee Complete 4 Views Right  Result Date: 10/09/2020 CLINICAL DATA:  Larey Seat. Right knee pain. EXAM: RIGHT KNEE - COMPLETE 4+ VIEW COMPARISON:  None. FINDINGS:  Mild degenerative changes are noted with early joint space narrowing and spurring. There is also chondrocalcinosis. No acute fracture or osteochondral lesion. No joint effusion. IMPRESSION: 1. Mild degenerative changes and chondrocalcinosis. 2. No acute bony findings or joint effusion. Electronically Signed   By: Rudie Meyer M.D.   On: 10/09/2020 12:48   DG HIP UNILAT WITH PELVIS 2-3 VIEWS LEFT  Result Date: 10/10/2020 CLINICAL DATA:  75 year old female status post left hip arthroplasty. EXAM: DG HIP (WITH OR WITHOUT PELVIS) 2-3V LEFT COMPARISON:  Left knee radiograph dated 10/09/2020. FINDINGS: There is a total left hip arthroplasty. The arthroplasty components appear intact and in anatomic alignment. There is no acute fracture or dislocation. The bones are osteopenic. Postsurgical changes in the soft tissues of the left hip and cutaneous staples. IMPRESSION: Status post total left hip arthroplasty.  No immediate complication. Electronically Signed   By: Elgie Collard M.D.   On: 10/10/2020 17:35   DG Femur Min 2 Views Left  Result Date: 10/09/2020 CLINICAL DATA:  Larey Seat. Left hip pain. EXAM: LEFT FEMUR 2 VIEWS COMPARISON:  None. FINDINGS: There is a mildly displaced femoral neck fracture and mild varus deformity. No femoral shaft fracture. IMPRESSION: Mildly displaced femoral neck fracture. Electronically Signed   By: Rudie Meyer M.D.   On: 10/09/2020 12:50    Micro Results   Recent Results (from the past 240 hour(s))  Resp Panel by RT-PCR (Flu A&B, Covid) Nasopharyngeal Swab     Status: None   Collection Time: 10/09/20  11:34 AM   Specimen: Nasopharyngeal Swab; Nasopharyngeal(NP) swabs in vial transport medium  Result Value Ref Range Status   SARS Coronavirus 2 by RT PCR NEGATIVE NEGATIVE Final    Comment: (NOTE) SARS-CoV-2 target nucleic acids are NOT DETECTED.  The SARS-CoV-2 RNA is generally detectable in upper respiratory specimens during the acute phase of infection. The  lowest concentration of SARS-CoV-2 viral copies this assay can detect is 138 copies/mL. A negative result does not preclude SARS-Cov-2 infection and should not be used as the sole basis for treatment or other patient management decisions. A negative result may occur with  improper specimen collection/handling, submission of specimen other than nasopharyngeal swab, presence of viral mutation(s) within the areas targeted by this assay, and inadequate number of viral copies(<138 copies/mL). A negative result must be combined with clinical observations, patient history, and epidemiological information. The expected result is Negative.  Fact Sheet for Patients:  BloggerCourse.com  Fact Sheet for Healthcare Providers:  SeriousBroker.it  This test is no t yet approved or cleared by the [place] FDA and  has been authorized for detection and/or diagnosis of SARS-CoV-2 by FDA under an Emergency Use Authorization (EUA). This EUA will remain  in effect (meaning this test can be used) for the duration of the COVID-19 declaration under Section 564(b)(1) of the Act, 21 U.S.C.section 360bbb-3(b)(1), unless the authorization is terminated  or revoked sooner.       Influenza A by PCR NEGATIVE NEGATIVE Final   Influenza B by PCR NEGATIVE NEGATIVE Final    Comment: (NOTE) The Xpert Xpress SARS-CoV-2/FLU/RSV plus assay is intended as an aid in the diagnosis of influenza from Nasopharyngeal swab specimens and should not be used as a sole basis for treatment. Nasal washings and aspirates are unacceptable for Xpert Xpress SARS-CoV-2/FLU/RSV testing.  Fact Sheet for Patients: BloggerCourse.com  Fact Sheet for Healthcare Providers: SeriousBroker.it  This test is not yet approved or cleared by the [place] FDA and has been authorized for detection and/or diagnosis of SARS-CoV-2 by FDA under  an Emergency Use Authorization (EUA). This EUA will remain in effect (meaning this test can be used) for the duration of the COVID-19 declaration under Section 564(b)(1) of the Act, 21 U.S.C. section 360bbb-3(b)(1), unless the authorization is terminated or revoked.  Performed at [facility], [address], [place], [place] [address]   Surgical PCR screen     Status: None   Collection Time: 10/09/20 11:44 PM   Specimen: Nasal Mucosa; Nasal Swab  Result Value Ref Range Status   MRSA, PCR NEGATIVE NEGATIVE Final   Staphylococcus aureus NEGATIVE NEGATIVE Final    Comment: (NOTE) The Xpert SA Assay (FDA approved for NASAL specimens in patients 85 years of age and older), is one component of a comprehensive surveillance program. It is not intended to diagnose infection nor to guide or monitor treatment. Performed at [facility], [address], [place], [place] [address]        Today   Subjective    Rachel Fox today has no new complaints  No fever  Or chills   No Nausea, Vomiting or Diarrhea         Patient has been seen and examined prior to discharge   Objective   Blood pressure 96/61, pulse (!) 126, temperature 98 F (36.7 C), temperature source Oral, resp. rate 19, height 5\' 3"  (1.6 m), weight 53.9 kg, SpO2 99 %.   Intake/Output Summary (Last 24 hours) at 10/12/2020 1530 Last data filed at 10/12/2020  1500 Gross per 24 hour  Intake 1900 ml  Output 1250 ml  Net 650 ml    Exam Gen:- Awake Alert,  in no apparent distress  HEENT:- [place].AT, No sclera icterus Neck-Supple Neck,No JVD,. Lungs-  CTAB , fair symmetrical air movement CV- S1, S2 normal, irregularly irregular , pacemaker in situ abd-  +ve B.Sounds, Abd Soft, No tenderness,    Extremity/Skin:- No  edema, pedal pulses present Psych-affect is appropriate, oriented x3 Neuro-residual left-sided weakness with contractures, no new focal deficits, no tremors MSK-left hip postop wound clean dry and intact    Data Review   CBC w Diff:  Lab Results  Component Value Date   WBC 10.6 (H) 10/12/2020   HGB 8.0 (L) 10/12/2020   HCT 25.5 (L) 10/12/2020   HCT 44.1 09/21/2018   PLT 154 10/12/2020   LYMPHOPCT 18 10/09/2020   MONOPCT 8 10/09/2020   EOSPCT 2 10/09/2020   BASOPCT 0 10/09/2020    CMP:  Lab Results  Component Value Date   NA 136 10/12/2020   K 3.9 10/12/2020   CL 104 10/12/2020   CO2 24 10/12/2020   BUN 24 (H) 10/12/2020   CREATININE 0.86 10/12/2020   PROT 7.7 10/09/2020   ALBUMIN 3.6 10/09/2020   BILITOT 0.9 10/09/2020   ALKPHOS 59 10/09/2020   AST 31 10/09/2020   ALT 26 10/09/2020  .   Total Discharge time is about 33 minutes  Shon Haleourage Alizee Maple M.D on 10/12/2020 at 3:30 PM  Go to [email or web address] -  for contact info  [facility] - Office  [phone]

## 2020-10-12 NOTE — Care Management Important Message (Signed)
Important Message  Patient Details  Name: Rachel Fox MRN: [id number] Date of Birth: 1945-12-04   Medicare Important Message Given:  Yes     Corey Harold 10/12/2020, 12:11 PM

## 2020-10-15 ENCOUNTER — Encounter: Payer: Self-pay | Admitting: Adult Health

## 2020-10-15 ENCOUNTER — Other Ambulatory Visit: Payer: Self-pay | Admitting: Adult Health

## 2020-10-15 ENCOUNTER — Non-Acute Institutional Stay (SKILLED_NURSING_FACILITY): Payer: Medicare Other | Admitting: Adult Health

## 2020-10-15 ENCOUNTER — Other Ambulatory Visit (HOSPITAL_COMMUNITY)
Admission: RE | Admit: 2020-10-15 | Discharge: 2020-10-15 | Disposition: A | Discharge status: Home / Self Care | Payer: Medicare Other | Source: Skilled Nursing Facility | Attending: Internal Medicine | Admitting: Internal Medicine

## 2020-10-15 DIAGNOSIS — G8110 Spastic hemiplegia affecting unspecified side: Secondary | ICD-10-CM | POA: Diagnosis not present

## 2020-10-15 DIAGNOSIS — I4891 Unspecified atrial fibrillation: Secondary | ICD-10-CM

## 2020-10-15 DIAGNOSIS — E1169 Type 2 diabetes mellitus with other specified complication: Secondary | ICD-10-CM | POA: Insufficient documentation

## 2020-10-15 DIAGNOSIS — S72002D Fracture of unspecified part of neck of left femur, subsequent encounter for closed fracture with routine healing: Secondary | ICD-10-CM

## 2020-10-15 DIAGNOSIS — K5909 Other constipation: Secondary | ICD-10-CM | POA: Insufficient documentation

## 2020-10-15 DIAGNOSIS — E785 Hyperlipidemia, unspecified: Secondary | ICD-10-CM

## 2020-10-15 DIAGNOSIS — I63511 Cerebral infarction due to unspecified occlusion or stenosis of right middle cerebral artery: Secondary | ICD-10-CM | POA: Diagnosis not present

## 2020-10-15 DIAGNOSIS — G40909 Epilepsy, unspecified, not intractable, without status epilepticus: Secondary | ICD-10-CM | POA: Diagnosis not present

## 2020-10-15 DIAGNOSIS — E1159 Type 2 diabetes mellitus with other circulatory complications: Secondary | ICD-10-CM

## 2020-10-15 DIAGNOSIS — I152 Hypertension secondary to endocrine disorders: Secondary | ICD-10-CM | POA: Diagnosis not present

## 2020-10-15 DIAGNOSIS — D62 Acute posthemorrhagic anemia: Secondary | ICD-10-CM | POA: Insufficient documentation

## 2020-10-15 DIAGNOSIS — E1165 Type 2 diabetes mellitus with hyperglycemia: Secondary | ICD-10-CM

## 2020-10-15 LAB — CBC
HCT: 22.7 % — ABNORMAL LOW (ref 36.0–46.0)
Hemoglobin: 7.1 g/dL — ABNORMAL LOW (ref 12.0–15.0)
MCH: 29.2 pg (ref 26.0–34.0)
MCHC: 31.3 g/dL (ref 30.0–36.0)
MCV: 93.4 fL (ref 80.0–100.0)
Platelets: 303 10*3/uL (ref 150–400)
RBC: 2.43 MIL/uL — ABNORMAL LOW (ref 3.87–5.11)
RDW: 15.2 % (ref 11.5–15.5)
WBC: 8.5 10*3/uL (ref 4.0–10.5)
nRBC: 0.8 % — ABNORMAL HIGH (ref 0.0–0.2)

## 2020-10-15 MED ORDER — OXYCODONE HCL 5 MG PO TABS: 5.0000 mg | ORAL_TABLET | Freq: Four times a day (QID) | ORAL | 0 refills | Status: DC | PRN

## 2020-10-15 NOTE — Progress Notes (Signed)
Location:  [facility] Room Number: 135-P Place of Service:  SNF (31)   CODE STATUS: Full code  Allergies  Allergen Reactions   Lactose Intolerance (Gi) Other (See Comments)    Causes Gas in patient.    Chief Complaint  Patient presents with   Hospitalization Follow-up    HPI:  She is a 75 year old woman who has been hospitalized from 10-09-20 through 10-12-20. Her medical history includes: afib; hypertension; hyperlipidemia; cva with right arm contracture. She had a mechanical fall after reaching out for her walker 2 days prior to presenting to the ED and found to have a left femoral neck mildly displaced; left hip/thigh hematoma. She had a ORIF on 10-10-20. She is here for short term rehab with her goal to return home. She is having significant left hip pain especially during therapy. The pain is interfering with her ability to participate in therapy. She denies any constipation; no insomnia; no changes in appetite. She will continue to be followed for her chronic illnesses including: Closed fracture of left hip with routine healing subsequent encounter:      Acute blood loss as cause of postoperative anemia: Right middle cerebral artery stroke/spastic hemiplegia: . Atrial fibrillation unspecified type:    Past Medical History:  Diagnosis Date   Atrial fibrillation (HCC)    CVA (cerebral vascular accident) (HCC)    High cholesterol    Hypertension    Seizures (HCC)    Type 2 diabetes mellitus (HCC)     Past Surgical History:  Procedure Laterality Date   ABDOMINAL HYSTERECTOMY     HIP ARTHROPLASTY Left 10/10/2020   Procedure: ARTHROPLASTY BIPOLAR HIP (HEMIARTHROPLASTY);  Surgeon: Oliver Barre, MD;  Location: [facility];  Service: Orthopedics;  Laterality: Left;   PACEMAKER IMPLANT N/A 08/22/2020   Procedure: PACEMAKER IMPLANT;  Surgeon: Marinus Maw, MD;  Location: [facility] INVASIVE CV [facility];  Service: Cardiovascular;  Laterality: N/A;   TUBAL LIGATION       Social History   Socioeconomic History   Marital status: Divorced    Spouse name: Not on file   Number of children: Not on file   Years of education: Not on file   Highest education level: Not on file  Occupational History   Not on file  Tobacco Use   Smoking status: Former    Types: Cigarettes    Quit date: 04/14/1988    Years since quitting: 32.5   Smokeless tobacco: Never  Vaping Use   Vaping Use: Never used  Substance and Sexual Activity   Alcohol use: Never   Drug use: Never   Sexual activity: Not on file  Other Topics Concern   Not on file  Social History Narrative   Not on file   Social Determinants of Health   Financial Resource Strain: Not on file  Food Insecurity: Not on file  Transportation Needs: Not on file  Physical Activity: Not on file  Stress: Not on file  Social Connections: Not on file  Intimate Partner Violence: Not on file   Family History  Problem Relation Age of Onset   Stroke Mother    Heart attack Father    Stroke Sister    Heart attack Sister       VITAL SIGNS BP (!) 135/58   Pulse 60   Temp (!) 97.1 F (36.2 C)   Resp 20   Ht 5\' 3"  (1.6 m)   SpO2 100%   BMI 21.05 kg/m  Outpatient Encounter Medications as of 10/15/2020  Medication Sig Note   acetaminophen (TYLENOL) 500 MG tablet Take 1,000 mg by mouth every 8 (eight) hours as needed for moderate pain or headache.    amiodarone (PACERONE) 100 MG tablet Take 1 tablet (100 mg total) by mouth daily.    apixaban (ELIQUIS) 5 MG TABS tablet Take 1 tablet (5 mg total) by mouth 2 (two) times daily.    bisacodyl (DULCOLAX) 10 MG suppository Place 1 suppository (10 mg total) rectally every Monday, Wednesday, and Friday.    ferrous sulfate 325 (65 FE) MG EC tablet Take 1 tablet (325 mg total) by mouth daily with breakfast.    levETIRAcetam (KEPPRA) 500 MG tablet Take 500 mg by mouth 2 (two) times daily.    metoprolol tartrate (LOPRESSOR) 25 MG tablet Take 0.5 tablets (12.5 mg total)  by mouth 2 (two) times daily.    NON FORMULARY Diet: No added salt, ConCHO; lactose intolerant    oxyCODONE (OXY IR/ROXICODONE) 5 MG immediate release tablet Take 5 mg by mouth every 6 (six) hours as needed for severe pain. And give daily prior to therapy    senna-docusate (SENOKOT-S) 8.6-50 MG tablet Take 2 tablets by mouth 2 (two) times daily.    atorvastatin (LIPITOR) 80 MG tablet Take 1 tablet (80 mg total) by mouth daily at 6 PM.    BOTOX 100 units SOLR injection Inject 100 Units into the muscle every 3 (three) months. (Patient not taking: Reported on 10/15/2020) 10/12/2020: Dr Gerilyn Pilgrim to approve it   [DISCONTINUED] oxyCODONE (OXY IR/ROXICODONE) 5 MG immediate release tablet Take 1 tablet (5 mg total) by mouth every 6 (six) hours as needed for up to 5 days for moderate pain.    No facility-administered encounter medications on file as of 10/15/2020.     SIGNIFICANT DIAGNOSTIC EXAMS  TODAY  10-09-20: right knee x-ray:  1. Mild degenerative changes and chondrocalcinosis. 2. No acute bony findings or joint effusion.  10-09-20: left femur x-ray:  Mildly displaced femoral neck fracture.   10-09-20: chest x-ray:  No acute cardiopulmonary findings   10-09-20: ct of pelvis:  1. Mildly displaced left femoral neck fracture. 2. No pelvic fractures or bone lesions. 3. Subcutaneous contusion/hematoma overlying the left hip area.  LABS REVIEWED;   10-09-20: wbc 8.4;hgb 13.5; hct 41.6; mcv 91.2 plt 169; glucose 112; bun 14; creat 0.75; k+ 3.0; na++140; ca 9.7; GFR>60; liver normal albumin 3.6; mag 2.0; tsh 4.107; vit D 35.90; hgb a1c 5.6 10-11-20: wbc 11.6; hgb 8.2; hct 26.5; mcv 995.3 plt 152; glucose 128; bun 21; creat 1.00; k+ 4.0; na++ 141; ca 9.2; GFR 59 10-12-20: wbc 10.6; hgb 8.0; hct 25.5; mcv 92.4 plt 154; glucose 108; bun 24; creat 0.86; k+ 3.9; na++ 136; ca 9.3 GFR>60 10-15-20: wbc 8.5; hgb 7.1; hct 22.7; mcv 93.4 plt 303    Review of Systems  Constitutional:  Negative for malaise/fatigue.   Respiratory:  Negative for cough and shortness of breath.   Cardiovascular:  Negative for chest pain, palpitations and leg swelling.  Gastrointestinal:  Negative for abdominal pain, constipation and heartburn.  Musculoskeletal:  Positive for joint pain and myalgias. Negative for back pain.       Left hip and thigh pain   Skin: Negative.   Neurological:  Negative for dizziness.  Psychiatric/Behavioral:  The patient is not nervous/anxious.    Physical Exam Constitutional:      General: She is not in acute distress.    Appearance: She is well-developed.  She is not diaphoretic.  Neck:     Thyroid: No thyromegaly.  Cardiovascular:     Rate and Rhythm: Normal rate. Rhythm irregular.     Pulses: Normal pulses.     Heart sounds: Normal heart sounds.  Pulmonary:     Effort: Pulmonary effort is normal. No respiratory distress.     Breath sounds: Normal breath sounds.  Abdominal:     General: Bowel sounds are normal. There is no distension.     Palpations: Abdomen is soft.     Tenderness: There is no abdominal tenderness.  Musculoskeletal:     Cervical back: Neck supple.     Right lower leg: No edema.     Left lower leg: No edema.     Comments: Is status post ORIF left femur on 10-10-20 Has right arm and hand contracture  Lymphadenopathy:     Cervical: No cervical adenopathy.  Skin:    General: Skin is warm and dry.     Comments: Left hip incision line without signs of infection present   Neurological:     Mental Status: She is alert and oriented to person, place, and time.       ASSESSMENT/ PLAN:  TODAY  Closed fracture of left hip with routine healing subsequent encounter: is stable will continue therapy as directed and will follow up with orthopedics. Will continue oxycodone every 6 hours as needed will give 5 mg prior to therapy. Will monitor her status.   2. Acute blood loss as cause of postoperative anemia: hgb on 10-09-20 13.5; hgb today is 7.1. will setup for a  transfusion of one unit of packed cells. Will continue iron daily Will monitor   3. Right middle cerebral artery stroke/spastic hemiplegia: has right arm contracture. Is stable will continue eliquis 5 mg twice daily   4. Atrial fibrillation unspecified type: heart rate is stable will continue amiodarone 100 mg daily lopressor 12.5 mg twice daily for rate control; eliquis 5 mg twice daily   5. Controlled type 2 diabetes mellitus with hyperglycemia without long term current use of insulin: hgb a1c 5.6 is on eliquis and statin  6. Hypertension associated with type 2 diabetes mellitus: is stable b/p 135/58: will continue lopressor 12.5 mg twice daily   7. Hyperlipidemia associated with type 2 diabetes mellitus: is stable will continue lipitor 80 mg daily   8. Chronic constipation: is stable will continue dulcolax suppository three times weekly senna s 2 tabs twice daily   9. Seizure disorder: is without recent seizure activity: will continue keppra 500 mg twice daily    Synthia Innocent NP [facility]  Contact [phone] Monday through Friday 8am- 5pm  After hours call [phone]

## 2020-10-16 ENCOUNTER — Encounter (HOSPITAL_COMMUNITY)
Admit: 2020-10-16 | Discharge: 2020-10-16 | Disposition: A | Discharge status: Home / Self Care | Payer: Medicare Other | Attending: Internal Medicine | Admitting: Internal Medicine

## 2020-10-16 DIAGNOSIS — I10 Essential (primary) hypertension: Secondary | ICD-10-CM | POA: Diagnosis not present

## 2020-10-16 DIAGNOSIS — S72002A Fracture of unspecified part of neck of left femur, initial encounter for closed fracture: Secondary | ICD-10-CM | POA: Diagnosis not present

## 2020-10-16 DIAGNOSIS — D62 Acute posthemorrhagic anemia: Secondary | ICD-10-CM | POA: Diagnosis not present

## 2020-10-16 DIAGNOSIS — Z20822 Contact with and (suspected) exposure to covid-19: Secondary | ICD-10-CM | POA: Diagnosis not present

## 2020-10-16 DIAGNOSIS — Z7901 Long term (current) use of anticoagulants: Secondary | ICD-10-CM | POA: Diagnosis not present

## 2020-10-16 DIAGNOSIS — I482 Chronic atrial fibrillation, unspecified: Secondary | ICD-10-CM | POA: Diagnosis not present

## 2020-10-16 DIAGNOSIS — I700 Atherosclerosis of aorta: Secondary | ICD-10-CM | POA: Diagnosis not present

## 2020-10-16 DIAGNOSIS — D649 Anemia, unspecified: Secondary | ICD-10-CM | POA: Diagnosis not present

## 2020-10-16 DIAGNOSIS — R918 Other nonspecific abnormal finding of lung field: Secondary | ICD-10-CM | POA: Diagnosis not present

## 2020-10-16 DIAGNOSIS — R911 Solitary pulmonary nodule: Secondary | ICD-10-CM | POA: Diagnosis not present

## 2020-10-16 DIAGNOSIS — I69354 Hemiplegia and hemiparesis following cerebral infarction affecting left non-dominant side: Secondary | ICD-10-CM | POA: Diagnosis not present

## 2020-10-16 DIAGNOSIS — J9811 Atelectasis: Secondary | ICD-10-CM | POA: Diagnosis not present

## 2020-10-16 DIAGNOSIS — S301XXA Contusion of abdominal wall, initial encounter: Secondary | ICD-10-CM | POA: Diagnosis not present

## 2020-10-16 DIAGNOSIS — E1169 Type 2 diabetes mellitus with other specified complication: Secondary | ICD-10-CM | POA: Diagnosis not present

## 2020-10-16 DIAGNOSIS — K922 Gastrointestinal hemorrhage, unspecified: Secondary | ICD-10-CM | POA: Diagnosis not present

## 2020-10-16 DIAGNOSIS — K921 Melena: Secondary | ICD-10-CM | POA: Diagnosis not present

## 2020-10-16 LAB — HEMOGLOBIN AND HEMATOCRIT, BLOOD
HCT: 24.9 % — ABNORMAL LOW (ref 36.0–46.0)
Hemoglobin: 7.6 g/dL — ABNORMAL LOW (ref 12.0–15.0)

## 2020-10-16 LAB — TYPE AND SCREEN

## 2020-10-17 ENCOUNTER — Non-Acute Institutional Stay (SKILLED_NURSING_FACILITY): Payer: Medicare Other | Admitting: Internal Medicine

## 2020-10-17 ENCOUNTER — Encounter (HOSPITAL_COMMUNITY): Payer: Medicare Other

## 2020-10-17 ENCOUNTER — Encounter: Payer: Self-pay | Admitting: Internal Medicine

## 2020-10-17 DIAGNOSIS — G8110 Spastic hemiplegia affecting unspecified side: Secondary | ICD-10-CM | POA: Diagnosis not present

## 2020-10-17 DIAGNOSIS — D62 Acute posthemorrhagic anemia: Secondary | ICD-10-CM

## 2020-10-17 DIAGNOSIS — I4891 Unspecified atrial fibrillation: Secondary | ICD-10-CM

## 2020-10-17 DIAGNOSIS — S72002D Fracture of unspecified part of neck of left femur, subsequent encounter for closed fracture with routine healing: Secondary | ICD-10-CM

## 2020-10-17 NOTE — Assessment & Plan Note (Signed)
PT/OT @ SNF WBAT

## 2020-10-17 NOTE — Patient Instructions (Signed)
See assessment and plan under each diagnosis in the problem list and acutely for this visit 

## 2020-10-17 NOTE — Assessment & Plan Note (Addendum)
Slightly irregular tach present today despite pacemaker. EKG ordered revealing AF w RVR , approx 100-150. Metoprolol increased to 25 mg bid.

## 2020-10-17 NOTE — Progress Notes (Signed)
NURSING HOME LOCATION:  [facility] ROOM NUMBER:  [id number]  CODE STATUS:  Full Code  PCP:  Benita Stabile MD  This is a comprehensive admission note to this SNFperformed on this date less than 30 days from date of admission. Included are preadmission medical/surgical history; reconciled medication list; family history; social history and comprehensive review of systems.  Corrections and additions to the records were documented. Comprehensive physical exam was also performed. Additionally a clinical summary was entered for each active diagnosis pertinent to this admission in the Problem List to enhance continuity of care.  HPI: She was hospitalized 7/26 - 10/12/2020 having fallen 7/24 in the context of history of CVA with left-sided weakness and left upper extremity contractures.  She was reaching for her walker when she fell onto her left side/hip.  There was no neurologic or cardiac prodrome reported. CT of the pelvis without contrast revealed mildly displaced left femoral neck fracture without pelvic fractures.  Subcutaneous contusion/hematoma was present overlying the left hip.  This was in the context of chronic Eliquis prophylaxis for AF & PMH CVA. Left hip hemiarthroplasty was completed 7/27 by Dr. Dallas Schimke.  Significantly preop hemoglobin was 11.5 but dropped to 8 postop.  Nadir hemoglobin was 7.1. Admission labs revealed a potassium of 3 and glucose of 112.  A1c was prediabetic at 5.6%. She was discharged to the SNF for rehab.  She was to be WBAT until follow-up with Dr. Dallas Schimke.  Past medical and surgical history: Includes history of atrial fibrillation, history of stroke, dyslipidemia, history of seizures, essential hypertension, and diabetes with neurovascular complications. Surgeries and procedures include abdominal hysterectomy and pacemaker implant.  Social history: She is a nondrinker; she is a former smoker.  Family history: Reviewed.  Significant family history of  stroke and heart disease.   Review of systems: Here at the SNF hemoglobin has risen to 7.6 negating need for transfusion today.  She denies any bleeding dyscrasias. She denies any neurologic or cardiac prodrome prior to the fall.  She tripped when she reached for her walker after using the bathroom. She states that her heels are burning; dressings have been applied. She stated that she had heart rates in the 40s prompting pacemaker placement 6/8. She states that she has had overactive bladder with incontinence since her stroke.   Constitutional: No fever, significant weight change, fatigue  Eyes: No redness, discharge, pain, vision change ENT/mouth: No nasal congestion, purulent discharge, earache, change in hearing, sore throat  Cardiovascular: No chest pain, palpitations, paroxysmal nocturnal dyspnea, claudication, edema  Respiratory: No cough, sputum production, hemoptysis, DOE, significant snoring, apnea Gastrointestinal: No heartburn, dysphagia, abdominal pain, nausea /vomiting, rectal bleeding, melena, change in bowels Genitourinary: No dysuria, hematuria, pyuria Dermatologic: No rash, pruritus, change in appearance of skin Neurologic: No dizziness, headache, syncope, seizures Psychiatric: No significant anxiety, depression, insomnia, anorexia Endocrine: No change in hair/skin/nails, excessive thirst, excessive hunger  Hematologic/lymphatic: No significant bruising, lymphadenopathy, abnormal bleeding Allergy/immunology: No itchy/watery eyes, significant sneezing, urticaria, angioedema  Physical exam:  Pertinent or positive findings: Facial asymmetry is present with decreased left nasolabial fold.  She has a large dark nevus over the left lower chin.  Thyroid is not enlarged but it is asymmetric with the right lobe larger than the left.  She exhibits resting tachycardia.  Pacemaker is present @ the left anterior chest.  She has trace-1/2+ edema.  Dorsalis pedis pulses are stronger than  posterior tibial pulses.  She exhibits a slight tremor of the  right upper extremity intermittently.  There is contracture of the left upper extremity with no range of motion.  The left lower extremity is profoundly weak to opposition.  The right upper extremity is stronger than the right lower extremity.  General appearance: Adequately nourished; no acute distress, increased work of breathing is present.   Lymphatic: No lymphadenopathy about the head, neck, axilla. Eyes: No conjunctival inflammation or lid edema is present. There is no scleral icterus. Ears:  External ear exam shows no significant lesions or deformities.   Nose:  External nasal examination shows no deformity or inflammation. Nasal mucosa are pink and moist without lesions, exudates Oral exam: Lips and gums are healthy appearing.There is no oropharyngeal erythema or exudate. Neck:  No thyromegaly, masses, tenderness noted.    Heart:  No gallop, murmur, click, rub.  Lungs: Chest clear to auscultation without wheezes, rhonchi, rales, rubs. Abdomen: Bowel sounds are normal.  Abdomen is soft and nontender with no organomegaly, hernias, masses. GU: Deferred  Extremities:  No cyanosis, clubbing Neurologic exam: Balance, Rhomberg, finger to nose testing could not be completed due to clinical state Skin: Warm & dry w/o tenting. No significant rash.  See clinical summary under each active problem in the Problem List with associated updated therapeutic plan

## 2020-10-17 NOTE — Assessment & Plan Note (Addendum)
Nadir Hgb 7.1 but improvement to 7.6 w/o PRC transfusion. No bleeding dyscrasias reported. T&C but will not be transfused.

## 2020-10-17 NOTE — Assessment & Plan Note (Addendum)
LUE contracture w/o ROM. Limited ROM & strength to opposition LLE. This contributed to fall & femur fracture 10/07/2020. Neurologist to administer Botox injections.

## 2020-10-18 ENCOUNTER — Encounter: Payer: Self-pay | Admitting: Adult Health

## 2020-10-18 ENCOUNTER — Other Ambulatory Visit: Payer: Self-pay | Admitting: *Deleted

## 2020-10-18 ENCOUNTER — Non-Acute Institutional Stay (SKILLED_NURSING_FACILITY): Payer: Medicare Other | Admitting: Adult Health

## 2020-10-18 ENCOUNTER — Encounter (HOSPITAL_COMMUNITY)
Admission: RE | Admit: 2020-10-18 | Discharge: 2020-10-18 | Disposition: A | Discharge status: Home / Self Care | Payer: Medicare Other | Source: Skilled Nursing Facility | Attending: Adult Health | Admitting: Adult Health

## 2020-10-18 DIAGNOSIS — D62 Acute posthemorrhagic anemia: Secondary | ICD-10-CM

## 2020-10-18 LAB — HEMOGLOBIN AND HEMATOCRIT, BLOOD
HCT: 21.4 % — ABNORMAL LOW (ref 36.0–46.0)
Hemoglobin: 6.4 g/dL — CL (ref 12.0–15.0)

## 2020-10-18 NOTE — Patient Outreach (Signed)
Member screened for potential [facility] Care Management needs. Ms. Arvanitis resides in [facility].   Update received [facility] SW indicating member's transition plan is to return home. She was independent prior to admission.   Will continue to follow while member remains in SNF. Will plan outreach as appropriate to discuss [facility] Care Management services.    Raiford Noble, MSN, RN,BSN [facility] Acute Care Coordinator [phone] [facility]) [phone]  (Toll free office)

## 2020-10-18 NOTE — Progress Notes (Signed)
Location:  [facility] Room Number: 135-P Place of Service:  SNF (31)   CODE STATUS: Full Code   Allergies  Allergen Reactions   Lactose Intolerance (Gi) Other (See Comments)    Causes Gas in patient.    Chief Complaint  Patient presents with   Acute Visit    Anemia    HPI:  She is status post left hip hemiarthroplasty on 10-10-20. Her hgb today is 6.4.  her previous readings are in the low 7 range. She denies any blood in her stools. She denies any chest pain no shortness of breath; no vertigo.   Past Medical History:  Diagnosis Date   Atrial fibrillation [facility])    CVA (cerebral vascular accident) (HCC)    High cholesterol    Hypertension    Seizures (HCC)    Type 2 diabetes mellitus (HCC)     Past Surgical History:  Procedure Laterality Date   ABDOMINAL HYSTERECTOMY     HIP ARTHROPLASTY Left 10/10/2020   Procedure: ARTHROPLASTY BIPOLAR HIP (HEMIARTHROPLASTY);  Surgeon: Oliver Barre, MD;  Location: [facility];  Service: Orthopedics;  Laterality: Left;   PACEMAKER IMPLANT N/A 08/22/2020   Procedure: PACEMAKER IMPLANT;  Surgeon: Marinus Maw, MD;  Location: [facility] INVASIVE CV LAB;  Service: Cardiovascular;  Laterality: N/A;   TUBAL LIGATION      Social History   Socioeconomic History   Marital status: Divorced    Spouse name: Not on file   Number of children: Not on file   Years of education: Not on file   Highest education level: Not on file  Occupational History   Not on file  Tobacco Use   Smoking status: Former    Types: Cigarettes    Quit date: 04/14/1988    Years since quitting: 32.5   Smokeless tobacco: Never  Vaping Use   Vaping Use: Never used  Substance and Sexual Activity   Alcohol use: Never   Drug use: Never   Sexual activity: Not on file  Other Topics Concern   Not on file  Social History Narrative   Not on file   Social Determinants of Health   Financial Resource Strain: Not on file  Food Insecurity: Not on file   Transportation Needs: Not on file  Physical Activity: Not on file  Stress: Not on file  Social Connections: Not on file  Intimate Partner Violence: Not on file   Family History  Problem Relation Age of Onset   Stroke Mother    Heart attack Father    Stroke Sister    Heart attack Sister       VITAL SIGNS BP (!) 135/58   Pulse 60   Temp 98.2 F (36.8 C)   Resp 20   Ht 5\' 3"  (1.6 m)   Wt 123 lb (55.8 kg)   SpO2 100%   BMI 21.79 kg/m   Outpatient Encounter Medications as of 10/18/2020  Medication Sig Note   acetaminophen (TYLENOL) 500 MG tablet Take 1,000 mg by mouth every 8 (eight) hours as needed for moderate pain or headache.    amiodarone (PACERONE) 100 MG tablet Take 1 tablet (100 mg total) by mouth daily.    apixaban (ELIQUIS) 5 MG TABS tablet Take 1 tablet (5 mg total) by mouth 2 (two) times daily.    atorvastatin (LIPITOR) 80 MG tablet Take 1 tablet (80 mg total) by mouth daily at 6 PM.    Balsam [place]-Castor Oil (VENELEX) OINT Apply 1 application  topically as directed. Apply to sacrum and coccyx qshift for prevention. Every Shift; Day, Evening, Night    bisacodyl (DULCOLAX) 10 MG suppository Place 1 suppository (10 mg total) rectally every Monday, Wednesday, and Friday.    Ensure (ENSURE) Take 237 mLs by mouth daily at 12 noon.    ferrous sulfate 325 (65 FE) MG EC tablet Take 1 tablet (325 mg total) by mouth daily with breakfast.    levETIRAcetam (KEPPRA) 500 MG tablet Take 500 mg by mouth 2 (two) times daily.    metoprolol tartrate (LOPRESSOR) 25 MG tablet Take 0.5 tablets (12.5 mg total) by mouth 2 (two) times daily.    NON FORMULARY Diet: No added salt, ConCHO; lactose intolerant    oxyCODONE (OXY IR/ROXICODONE) 5 MG immediate release tablet Take 5 mg by mouth daily as needed for severe pain.    senna-docusate (SENOKOT-S) 8.6-50 MG tablet Take 2 tablets by mouth 2 (two) times daily.    BOTOX 100 units SOLR injection Inject 100 Units into the muscle every 3 (three)  months. (Patient not taking: No sig reported) 10/12/2020: Dr Gerilyn Pilgrim to approve it   [DISCONTINUED] oxyCODONE (OXY IR/ROXICODONE) 5 MG immediate release tablet Take 1 tablet (5 mg total) by mouth every 6 (six) hours as needed for severe pain (and give daily prior to therapy). And give daily prior to therapy    No facility-administered encounter medications on file as of 10/18/2020.     SIGNIFICANT DIAGNOSTIC EXAMS  PREVIOUS   10-09-20: right knee x-ray:  1. Mild degenerative changes and chondrocalcinosis. 2. No acute bony findings or joint effusion.  10-09-20: left femur x-ray:  Mildly displaced femoral neck fracture.   10-09-20: chest x-ray:  No acute cardiopulmonary findings   10-09-20: ct of pelvis:  1. Mildly displaced left femoral neck fracture. 2. No pelvic fractures or bone lesions. 3. Subcutaneous contusion/hematoma overlying the left hip area.  NO NEW EXAMS.   LABS REVIEWED; PREVIOUS   10-09-20: wbc 8.4;hgb 13.5; hct 41.6; mcv 91.2 plt 169; glucose 112; bun 14; creat 0.75; k+ 3.0; na++140; ca 9.7; GFR>60; liver normal albumin 3.6; mag 2.0; tsh 4.107; vit D 35.90; hgb a1c 5.6 10-11-20: wbc 11.6; hgb 8.2; hct 26.5; mcv 995.3 plt 152; glucose 128; bun 21; creat 1.00; k+ 4.0; na++ 141; ca 9.2; GFR 59 10-12-20: wbc 10.6; hgb 8.0; hct 25.5; mcv 92.4 plt 154; glucose 108; bun 24; creat 0.86; k+ 3.9; na++ 136; ca 9.3 GFR>60 10-15-20: wbc 8.5; hgb 7.1; hct 22.7; mcv 93.4 plt 303   TODAY  10-16-20: hgb 7.6 hct 24.9 10-18-20: hgb 6.4; hct 21.4  Review of Systems  Constitutional:  Negative for malaise/fatigue.  Respiratory:  Negative for cough and shortness of breath.   Cardiovascular:  Negative for chest pain, palpitations and leg swelling.  Gastrointestinal:  Negative for abdominal pain, constipation and heartburn.  Musculoskeletal:  Negative for back pain, joint pain and myalgias.  Skin: Negative.   Neurological:  Negative for dizziness.  Psychiatric/Behavioral:  The patient is not  nervous/anxious.    Physical Exam Constitutional:      General: She is not in acute distress.    Appearance: She is well-developed. She is not diaphoretic.  Neck:     Thyroid: No thyromegaly.  Cardiovascular:     Rate and Rhythm: Normal rate. Rhythm irregular.     Pulses: Normal pulses.     Heart sounds: Normal heart sounds.  Pulmonary:     Effort: Pulmonary effort is normal. No respiratory distress.  Breath sounds: Normal breath sounds.  Abdominal:     General: Bowel sounds are normal. There is no distension.     Palpations: Abdomen is soft.     Tenderness: There is no abdominal tenderness.  Musculoskeletal:     Cervical back: Neck supple.     Right lower leg: No edema.     Left lower leg: No edema.     Comments: Is status post ORIF left femur on 10-10-20 Has right arm and hand contracture   Lymphadenopathy:     Cervical: No cervical adenopathy.  Skin:    General: Skin is warm and dry.  Neurological:     Mental Status: She is alert and oriented to person, place, and time.  Psychiatric:        Mood and Affect: Mood normal.     ASSESSMENT/ PLAN:  TODAY  Acute blood loss as cause of postoperative anemia;  is worse will setup for transfusion of 1 unite packed cells.    Synthia Innocent NP [facility] Adult Medicine  Contact [phone] Monday through Friday 8am- 5pm  After hours call [phone]

## 2020-10-19 ENCOUNTER — Emergency Department (HOSPITAL_COMMUNITY): Payer: Medicare Other

## 2020-10-19 ENCOUNTER — Encounter: Payer: Medicare Other | Admitting: Orthopedic Surgery

## 2020-10-19 ENCOUNTER — Inpatient Hospital Stay (HOSPITAL_COMMUNITY)
Admission: EM | Admit: 2020-10-19 | Discharge: 2020-10-21 | DRG: 378 | Disposition: A | Discharge status: Home / Self Care | Payer: Medicare Other | Attending: Family Medicine | Admitting: Family Medicine

## 2020-10-19 ENCOUNTER — Other Ambulatory Visit: Payer: Self-pay

## 2020-10-19 ENCOUNTER — Encounter (HOSPITAL_COMMUNITY): Payer: Self-pay

## 2020-10-19 DIAGNOSIS — W19XXXD Unspecified fall, subsequent encounter: Secondary | ICD-10-CM | POA: Diagnosis present

## 2020-10-19 DIAGNOSIS — Z8249 Family history of ischemic heart disease and other diseases of the circulatory system: Secondary | ICD-10-CM | POA: Diagnosis not present

## 2020-10-19 DIAGNOSIS — J9811 Atelectasis: Secondary | ICD-10-CM | POA: Diagnosis not present

## 2020-10-19 DIAGNOSIS — E7849 Other hyperlipidemia: Secondary | ICD-10-CM

## 2020-10-19 DIAGNOSIS — Z20822 Contact with and (suspected) exposure to covid-19: Secondary | ICD-10-CM | POA: Diagnosis present

## 2020-10-19 DIAGNOSIS — E1159 Type 2 diabetes mellitus with other circulatory complications: Secondary | ICD-10-CM | POA: Diagnosis not present

## 2020-10-19 DIAGNOSIS — E739 Lactose intolerance, unspecified: Secondary | ICD-10-CM | POA: Diagnosis present

## 2020-10-19 DIAGNOSIS — D649 Anemia, unspecified: Secondary | ICD-10-CM

## 2020-10-19 DIAGNOSIS — I639 Cerebral infarction, unspecified: Secondary | ICD-10-CM | POA: Diagnosis present

## 2020-10-19 DIAGNOSIS — E1169 Type 2 diabetes mellitus with other specified complication: Secondary | ICD-10-CM | POA: Diagnosis present

## 2020-10-19 DIAGNOSIS — S72002D Fracture of unspecified part of neck of left femur, subsequent encounter for closed fracture with routine healing: Secondary | ICD-10-CM

## 2020-10-19 DIAGNOSIS — Z823 Family history of stroke: Secondary | ICD-10-CM

## 2020-10-19 DIAGNOSIS — Z79899 Other long term (current) drug therapy: Secondary | ICD-10-CM | POA: Diagnosis not present

## 2020-10-19 DIAGNOSIS — R1310 Dysphagia, unspecified: Secondary | ICD-10-CM | POA: Diagnosis present

## 2020-10-19 DIAGNOSIS — I700 Atherosclerosis of aorta: Secondary | ICD-10-CM | POA: Diagnosis not present

## 2020-10-19 DIAGNOSIS — D500 Iron deficiency anemia secondary to blood loss (chronic): Secondary | ICD-10-CM | POA: Diagnosis not present

## 2020-10-19 DIAGNOSIS — D62 Acute posthemorrhagic anemia: Secondary | ICD-10-CM | POA: Diagnosis present

## 2020-10-19 DIAGNOSIS — I69354 Hemiplegia and hemiparesis following cerebral infarction affecting left non-dominant side: Secondary | ICD-10-CM

## 2020-10-19 DIAGNOSIS — E7800 Pure hypercholesterolemia, unspecified: Secondary | ICD-10-CM | POA: Diagnosis present

## 2020-10-19 DIAGNOSIS — K922 Gastrointestinal hemorrhage, unspecified: Secondary | ICD-10-CM | POA: Diagnosis not present

## 2020-10-19 DIAGNOSIS — G40909 Epilepsy, unspecified, not intractable, without status epilepticus: Secondary | ICD-10-CM

## 2020-10-19 DIAGNOSIS — Z950 Presence of cardiac pacemaker: Secondary | ICD-10-CM

## 2020-10-19 DIAGNOSIS — I152 Hypertension secondary to endocrine disorders: Secondary | ICD-10-CM | POA: Diagnosis not present

## 2020-10-19 DIAGNOSIS — Z96642 Presence of left artificial hip joint: Secondary | ICD-10-CM | POA: Diagnosis present

## 2020-10-19 DIAGNOSIS — I6930 Unspecified sequelae of cerebral infarction: Secondary | ICD-10-CM | POA: Diagnosis present

## 2020-10-19 DIAGNOSIS — I69391 Dysphagia following cerebral infarction: Secondary | ICD-10-CM

## 2020-10-19 DIAGNOSIS — K5909 Other constipation: Secondary | ICD-10-CM | POA: Diagnosis present

## 2020-10-19 DIAGNOSIS — Z7901 Long term (current) use of anticoagulants: Secondary | ICD-10-CM | POA: Diagnosis not present

## 2020-10-19 DIAGNOSIS — E785 Hyperlipidemia, unspecified: Secondary | ICD-10-CM | POA: Diagnosis present

## 2020-10-19 DIAGNOSIS — K921 Melena: Secondary | ICD-10-CM | POA: Diagnosis present

## 2020-10-19 DIAGNOSIS — R911 Solitary pulmonary nodule: Secondary | ICD-10-CM | POA: Diagnosis not present

## 2020-10-19 DIAGNOSIS — E876 Hypokalemia: Secondary | ICD-10-CM | POA: Diagnosis present

## 2020-10-19 DIAGNOSIS — R195 Other fecal abnormalities: Secondary | ICD-10-CM | POA: Diagnosis not present

## 2020-10-19 DIAGNOSIS — R918 Other nonspecific abnormal finding of lung field: Secondary | ICD-10-CM | POA: Diagnosis not present

## 2020-10-19 DIAGNOSIS — K2289 Other specified disease of esophagus: Secondary | ICD-10-CM | POA: Diagnosis not present

## 2020-10-19 DIAGNOSIS — S301XXA Contusion of abdominal wall, initial encounter: Secondary | ICD-10-CM | POA: Diagnosis not present

## 2020-10-19 DIAGNOSIS — S72002A Fracture of unspecified part of neck of left femur, initial encounter for closed fracture: Secondary | ICD-10-CM

## 2020-10-19 DIAGNOSIS — I4891 Unspecified atrial fibrillation: Secondary | ICD-10-CM

## 2020-10-19 DIAGNOSIS — M25552 Pain in left hip: Secondary | ICD-10-CM | POA: Diagnosis not present

## 2020-10-19 DIAGNOSIS — I482 Chronic atrial fibrillation, unspecified: Secondary | ICD-10-CM | POA: Diagnosis present

## 2020-10-19 DIAGNOSIS — I10 Essential (primary) hypertension: Secondary | ICD-10-CM | POA: Diagnosis present

## 2020-10-19 DIAGNOSIS — Z87891 Personal history of nicotine dependence: Secondary | ICD-10-CM | POA: Diagnosis not present

## 2020-10-19 DIAGNOSIS — E119 Type 2 diabetes mellitus without complications: Secondary | ICD-10-CM

## 2020-10-19 LAB — MAGNESIUM: Magnesium: 1.9 mg/dL (ref 1.7–2.4)

## 2020-10-19 LAB — CBC
HCT: 23.5 % — ABNORMAL LOW (ref 36.0–46.0)
Hemoglobin: 6.8 g/dL — CL (ref 12.0–15.0)
MCH: 29.1 pg (ref 26.0–34.0)
MCHC: 28.9 g/dL — ABNORMAL LOW (ref 30.0–36.0)
MCV: 100.4 fL — ABNORMAL HIGH (ref 80.0–100.0)
Platelets: 288 10*3/uL (ref 150–400)
RBC: 2.34 MIL/uL — ABNORMAL LOW (ref 3.87–5.11)
RDW: 17.2 % — ABNORMAL HIGH (ref 11.5–15.5)
WBC: 8.3 10*3/uL (ref 4.0–10.5)
nRBC: 0.5 % — ABNORMAL HIGH (ref 0.0–0.2)

## 2020-10-19 LAB — PROTIME-INR
INR: 1.3 — ABNORMAL HIGH (ref 0.8–1.2)
INR: 1.3 — ABNORMAL HIGH (ref 0.8–1.2)
Prothrombin Time: 16 seconds — ABNORMAL HIGH (ref 11.4–15.2)
Prothrombin Time: 15.8 seconds — ABNORMAL HIGH (ref 11.4–15.2)

## 2020-10-19 LAB — RETICULOCYTES
Immature Retic Fract: 39.4 % — ABNORMAL HIGH (ref 2.3–15.9)
RBC.: 2.37 MIL/uL — ABNORMAL LOW (ref 3.87–5.11)
Retic Count, Absolute: 158.6 10*3/uL (ref 19.0–186.0)
Retic Ct Pct: 6.6 % — ABNORMAL HIGH (ref 0.4–3.1)

## 2020-10-19 LAB — COMPREHENSIVE METABOLIC PANEL
ALT: 24 U/L (ref 0–44)
AST: 37 U/L (ref 15–41)
Albumin: 2.9 g/dL — ABNORMAL LOW (ref 3.5–5.0)
Alkaline Phosphatase: 62 U/L (ref 38–126)
Anion gap: 7 (ref 5–15)
BUN: 14 mg/dL (ref 8–23)
CO2: 28 mmol/L (ref 22–32)
Calcium: 9.2 mg/dL (ref 8.9–10.3)
Chloride: 106 mmol/L (ref 98–111)
Creatinine, Ser: 0.78 mg/dL (ref 0.44–1.00)
GFR, Estimated: >60 mL/min (ref >60)
Glucose, Bld: 102 mg/dL — ABNORMAL HIGH (ref 70–99)
Potassium: 3.2 mmol/L — ABNORMAL LOW (ref 3.5–5.1)
Sodium: 141 mmol/L (ref 135–145)
Total Bilirubin: 0.8 mg/dL (ref 0.3–1.2)
Total Protein: 6.9 g/dL (ref 6.5–8.1)

## 2020-10-19 LAB — IRON AND TIBC
Iron: 92 ug/dL (ref 28–170)
Saturation Ratios: 42 % — ABNORMAL HIGH (ref 10.4–31.8)
TIBC: 219 ug/dL — ABNORMAL LOW (ref 250–450)
UIBC: 127 ug/dL

## 2020-10-19 LAB — RESP PANEL BY RT-PCR (FLU A&B, COVID) ARPGX2
Influenza A by PCR: NEGATIVE
Influenza B by PCR: NEGATIVE
SARS Coronavirus 2 by RT PCR: NEGATIVE

## 2020-10-19 LAB — GLUCOSE, CAPILLARY
Glucose-Capillary: 70 mg/dL (ref 70–99)
Glucose-Capillary: 104 mg/dL — ABNORMAL HIGH (ref 70–99)

## 2020-10-19 LAB — FOLATE: Folate: 8.7 ng/mL (ref >5.9)

## 2020-10-19 LAB — CBG MONITORING, ED
Glucose-Capillary: 69 mg/dL — ABNORMAL LOW (ref 70–99)
Glucose-Capillary: 74 mg/dL (ref 70–99)

## 2020-10-19 LAB — APTT
aPTT: 31 seconds (ref 24–36)
aPTT: 31 seconds (ref 24–36)

## 2020-10-19 LAB — HEMOGLOBIN AND HEMATOCRIT, BLOOD
HCT: 26.7 % — ABNORMAL LOW (ref 36.0–46.0)
Hemoglobin: 8.4 g/dL — ABNORMAL LOW (ref 12.0–15.0)

## 2020-10-19 LAB — POC OCCULT BLOOD, ED: Fecal Occult Bld: POSITIVE — AB

## 2020-10-19 LAB — ABO/RH: ABO/RH(D): A POS

## 2020-10-19 LAB — FERRITIN: Ferritin: 109 ng/mL (ref 11–307)

## 2020-10-19 LAB — VITAMIN B12: Vitamin B-12: 316 pg/mL (ref 180–914)

## 2020-10-19 LAB — PREPARE RBC (CROSSMATCH)

## 2020-10-19 MED ORDER — POTASSIUM CHLORIDE 10 MEQ/100ML IV SOLN
10.0000 meq | INTRAVENOUS | Status: AC
Start: 2020-10-19 — End: 2020-10-19
  Administered 2020-10-19: 10 meq via INTRAVENOUS
  Filled 2020-10-19: qty 100

## 2020-10-19 MED ORDER — PANTOPRAZOLE SODIUM 40 MG IV SOLR
40.0000 mg | Freq: Two times a day (BID) | INTRAVENOUS | Status: DC
  Administered 2020-10-19 – 2020-10-21 (×5): 40 mg via INTRAVENOUS
  Filled 2020-10-19 (×4): qty 40

## 2020-10-19 MED ORDER — LEVETIRACETAM 500 MG PO TABS
500.0000 mg | ORAL_TABLET | Freq: Two times a day (BID) | ORAL | Status: DC
  Administered 2020-10-19 – 2020-10-21 (×5): 500 mg via ORAL
  Filled 2020-10-19 (×5): qty 1

## 2020-10-19 MED ORDER — SODIUM CHLORIDE 0.9% IV SOLUTION: Freq: Once | INTRAVENOUS | Status: DC

## 2020-10-19 MED ORDER — ACETAMINOPHEN 650 MG RE SUPP: 650.0000 mg | Freq: Four times a day (QID) | RECTAL | Status: DC | PRN

## 2020-10-19 MED ORDER — POTASSIUM CHLORIDE IN NACL 20-0.9 MEQ/L-% IV SOLN
INTRAVENOUS | Status: DC
  Filled 2020-10-19: qty 1000

## 2020-10-19 MED ORDER — INSULIN ASPART 100 UNIT/ML IJ SOLN: 0.0000 [IU] | Freq: Three times a day (TID) | INTRAMUSCULAR | Status: DC

## 2020-10-19 MED ORDER — FERROUS SULFATE 325 (65 FE) MG PO TABS
325.0000 mg | ORAL_TABLET | Freq: Every day | ORAL | Status: DC
  Administered 2020-10-20 – 2020-10-21 (×2): 325 mg via ORAL
  Filled 2020-10-19 (×2): qty 1

## 2020-10-19 MED ORDER — SODIUM CHLORIDE 0.9% IV SOLUTION: Freq: Once | INTRAVENOUS | Status: AC

## 2020-10-19 MED ORDER — METOPROLOL TARTRATE 25 MG PO TABS
12.5000 mg | ORAL_TABLET | Freq: Two times a day (BID) | ORAL | Status: DC
  Administered 2020-10-19 – 2020-10-21 (×5): 12.5 mg via ORAL
  Filled 2020-10-19 (×5): qty 1

## 2020-10-19 MED ORDER — AMIODARONE HCL 200 MG PO TABS
100.0000 mg | ORAL_TABLET | Freq: Every day | ORAL | Status: DC
  Administered 2020-10-19 – 2020-10-21 (×3): 100 mg via ORAL
  Filled 2020-10-19 (×3): qty 1

## 2020-10-19 MED ORDER — PANTOPRAZOLE SODIUM 40 MG IV SOLR
40.0000 mg | INTRAVENOUS | Status: DC
  Filled 2020-10-19: qty 40

## 2020-10-19 MED ORDER — IOHEXOL 300 MG/ML  SOLN
75.0000 mL | Freq: Once | INTRAMUSCULAR | Status: AC | PRN
  Administered 2020-10-19: 75 mL via INTRAVENOUS

## 2020-10-19 MED ORDER — ATORVASTATIN CALCIUM 40 MG PO TABS
80.0000 mg | ORAL_TABLET | Freq: Every day | ORAL | Status: DC
  Administered 2020-10-19 – 2020-10-20 (×2): 80 mg via ORAL
  Filled 2020-10-19 (×2): qty 2

## 2020-10-19 MED ORDER — ACETAMINOPHEN 325 MG PO TABS: 650.0000 mg | ORAL_TABLET | Freq: Four times a day (QID) | ORAL | Status: DC | PRN

## 2020-10-19 MED ORDER — ONDANSETRON HCL 4 MG PO TABS: 4.0000 mg | ORAL_TABLET | Freq: Four times a day (QID) | ORAL | Status: DC | PRN

## 2020-10-19 MED ORDER — PEG 3350-KCL-NA BICARB-NACL 420 G PO SOLR
4000.0000 mL | Freq: Once | ORAL | Status: AC
  Administered 2020-10-19: 4000 mL via ORAL
  Filled 2020-10-19: qty 4000

## 2020-10-19 MED ORDER — ONDANSETRON HCL 4 MG/2ML IJ SOLN: 4.0000 mg | Freq: Four times a day (QID) | INTRAMUSCULAR | Status: DC | PRN

## 2020-10-19 MED ORDER — FENTANYL CITRATE PF 50 MCG/ML IJ SOSY
12.5000 ug | PREFILLED_SYRINGE | INTRAMUSCULAR | Status: DC | PRN
  Administered 2020-10-19: 12.5 ug via INTRAVENOUS
  Administered 2020-10-20 – 2020-10-21 (×4): 25 ug via INTRAVENOUS
  Filled 2020-10-19 (×5): qty 1

## 2020-10-19 NOTE — Anesthesia Preprocedure Evaluation (Addendum)
Anesthesia Evaluation  Patient identified by MRN, date of birth, ID band Patient awake    Reviewed: Allergy & Precautions, NPO status , Patient's Chart, lab work & pertinent test results  Airway Mallampati: II  TM Distance: >3 FB Neck ROM: Full    Dental  (+) Dental Advisory Given, Missing   Pulmonary former smoker,    Pulmonary exam normal breath sounds clear to auscultation       Cardiovascular Exercise Tolerance: Poor hypertension, Pt. on medications + dysrhythmias Atrial Fibrillation + pacemaker  Rhythm:Regular Rate:Abnormal  Covering for Vernona Rieger - Please let the patient know her echocardiogram showed normal pumping function of the heart with a preserved EF of 55-60%. No wall motion abnormalities. She did have dilation of her left atrium (upper chamber of the heart) which is common to see with her known atrial fibrillation. Mild leakage along the mitral valve but no significant abnormalities. Overall, a reassuring study.   Neuro/Psych Seizures -, Well Controlled,  CVA, Residual Symptoms    GI/Hepatic Neg liver ROS, GERD  Medicated and Controlled,  Endo/Other  diabetes, Well Controlled, Type 2, Insulin Dependent  Renal/GU negative Renal ROS     Musculoskeletal negative musculoskeletal ROS (+)   Abdominal   Peds  Hematology  (+) anemia ,   Anesthesia Other Findings   Reproductive/Obstetrics negative OB ROS                           Anesthesia Physical Anesthesia Plan  ASA: 3  Anesthesia Plan: General   Post-op Pain Management:    Induction: Intravenous  PONV Risk Score and Plan: Propofol infusion  Airway Management Planned: Nasal Cannula and Natural Airway  Additional Equipment:   Intra-op Plan:   Post-operative Plan:   Informed Consent: I have reviewed the patients History and Physical, chart, labs and discussed the procedure including the risks, benefits and alternatives for  the proposed anesthesia with the patient or authorized representative who has indicated his/her understanding and acceptance.     Dental advisory given  Plan Discussed with: Surgeon  Anesthesia Plan Comments:        Anesthesia Quick Evaluation

## 2020-10-19 NOTE — ED Triage Notes (Signed)
Pt was sent over from [facility] for low Hbg of 6.4.  Pt denies any pain, no other complaints at this time.

## 2020-10-19 NOTE — H&P (Addendum)
History and Physical  [facility]  Rachel Fox OTL:[id number] DOB: 01/14/1946 DOA: 10/19/2020  PCP: Benita Stabile, MD  Patient coming from: Home  Level of care: Telemetry  I have personally briefly reviewed patient's old medical records in [facility] Link  Chief Complaint: abnormal lab low hemoglobin   HPI: Rachel Fox is a 75 y.o. 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  Her Hg baseline had been 13 and after having labs retested today her hemoglobin is down to 6.8.  She tested Hemoccult positive in the stool.  GI was consulted and recommended EGD. 1 unit of PRBC was recommended.   She was started on IV protonix and admission was requested for further management.   Review of Systems: Review of Systems  Constitutional:  Positive for malaise/fatigue and weight loss.  HENT: Negative.    Eyes: Negative.   Respiratory: Negative.    Cardiovascular: Negative.   Gastrointestinal:  Positive for blood in stool and melena. Negative for abdominal pain, constipation, heartburn, nausea and vomiting.  Genitourinary: Negative.   Musculoskeletal: Negative.   Skin:  Negative for itching and rash.  Neurological:  Positive for focal weakness and weakness. Negative for seizures and loss of consciousness.  Endo/Heme/Allergies:  Negative for environmental allergies and polydipsia. Bruises/bleeds easily.  Psychiatric/Behavioral: Negative.    All other systems reviewed and are negative.   Past Medical History:  Diagnosis Date   Atrial fibrillation [facility])    CVA (cerebral vascular accident) (HCC)    High cholesterol     Hypertension    Seizures (HCC)    Type 2 diabetes mellitus (HCC)     Past Surgical History:  Procedure Laterality Date   ABDOMINAL HYSTERECTOMY     HIP ARTHROPLASTY Left 10/10/2020   Procedure: ARTHROPLASTY BIPOLAR HIP (HEMIARTHROPLASTY);  Surgeon: Oliver Barre, MD;  Location: [facility];  Service: Orthopedics;  Laterality: Left;   PACEMAKER IMPLANT N/A 08/22/2020   Procedure: PACEMAKER IMPLANT;  Surgeon: Marinus Maw, MD;  Location: [facility] INVASIVE CV LAB;  Service: Cardiovascular;  Laterality: N/A;   TUBAL LIGATION       reports that she quit smoking about 32 years ago. Her smoking use included cigarettes. She has never used smokeless tobacco. She reports that she does not drink alcohol and does not use drugs.  Allergies  Allergen Reactions   Lactose Intolerance (Gi) Other (See Comments)    Causes Gas in patient.    Family History  Problem Relation Age of Onset   Stroke Mother    Heart attack Father    Stroke Sister    Heart attack Sister     Prior to Admission medications   Medication Sig Start Date End Date Taking? Authorizing Provider  acetaminophen (TYLENOL) 500 MG tablet Take 1,000 mg by mouth every 8 (eight) hours as needed for moderate pain or headache.   Yes [provider]  amiodarone (PACERONE) 100 MG tablet Take 1 tablet (100 mg total) by mouth daily. 05/17/20  Yes Leone Brand, NP  apixaban (ELIQUIS) 5 MG TABS tablet Take 1 tablet (5 mg total) by mouth 2 (two) times daily. 10/13/20 10/08/21 Yes Emokpae, Courage, MD  atorvastatin (LIPITOR) 80 MG tablet  Take 1 tablet (80 mg total) by mouth daily at 6 PM. 09/27/18 10/18/48 Yes Amin, Loura Halt, MD  bisacodyl (DULCOLAX) 10 MG suppository Place 1 suppository (10 mg total) rectally every Monday, Wednesday, and Friday. 10/12/20  Yes Emokpae, Courage, MD  Ensure (ENSURE) Take 237 mLs by mouth daily at 12 noon.   Yes [provider]  ferrous sulfate 325 (65 FE) MG EC tablet Take 1 tablet (325 mg total) by mouth  daily with breakfast. 10/12/20  Yes Emokpae, Courage, MD  levETIRAcetam (KEPPRA) 500 MG tablet Take 500 mg by mouth 2 (two) times daily.   Yes [provider]  metoprolol tartrate (LOPRESSOR) 25 MG tablet Take 0.5 tablets (12.5 mg total) by mouth 2 (two) times daily. 10/12/20  Yes Emokpae, Courage, MD  oxyCODONE (OXY IR/ROXICODONE) 5 MG immediate release tablet Take 5 mg by mouth daily as needed for severe pain.   Yes [provider]  senna-docusate (SENOKOT-S) 8.6-50 MG tablet Take 2 tablets by mouth 2 (two) times daily. 10/12/20  Yes Shon Hale, MD  Lucilla Lame [place] [employer] [facility]) OINT Apply 1 application topically as directed. Apply to sacrum and coccyx qshift for prevention. Every Shift; Day, Evening, Night 10/17/20   [provider]  BOTOX 100 units SOLR injection Inject 100 Units into the muscle every 3 (three) months. Patient not taking: No sig reported 09/19/20   [provider]  NON FORMULARY Diet: No added salt, ConCHO; lactose intolerant    [provider]    Physical Exam: Vitals:   10/19/20 1030 10/19/20 1100 10/19/20 1115 10/19/20 1130  BP: (!) 145/72 (!) 158/75 (!) 149/74 (!) 151/77  Pulse: 66 61 60 (!) 59  Resp: 14 15 15 14   Temp: 98.8 F (37.1 C)     TempSrc: Oral     SpO2: 100% 100% 100% 100%  Weight:      Height:        Constitutional: frail, elderly female, lying supine in bed, NAD, calm, comfortable Eyes: PERRL, lids and conjunctivae normal ENMT: Mucous membranes are moist. Posterior pharynx clear of any exudate or lesions.Normal dentition.  Neck: normal, supple, no masses, no thyromegaly Respiratory: clear to auscultation bilaterally, no wheezing, no crackles. Normal respiratory effort. No accessory muscle use.  Cardiovascular: normal s1, s2 sounds, no murmurs / rubs / gallops. No extremity edema. 2+ pedal pulses. No carotid bruits.  Abdomen: no tenderness, no masses palpated. No hepatosplenomegaly. Bowel sounds  positive.  Musculoskeletal: no clubbing / cyanosis. No joint deformity upper and lower extremities. Good ROM, no contractures. Normal muscle tone.  Skin: no rashes, lesions, ulcers. No induration Neurologic: CN 2-12 grossly intact. Sensation intact, DTR normal. Strength 5/5 in all 4.  Psychiatric: Normal judgment and insight. Alert and oriented x 3. Normal mood.   Labs on Admission: I have personally reviewed following labs and imaging studies  CBC: Recent Labs  Lab 10/15/20 1041 10/16/20 1059 10/18/20 0700 10/19/20 0710  WBC 8.5  --   --  8.3  HGB 7.1* 7.6* 6.4* 6.8*  HCT 22.7* 24.9* 21.4* 23.5*  MCV 93.4  --   --  100.4*  PLT 303  --   --  288   Basic Metabolic Panel: Recent Labs  Lab 10/19/20 0600  NA 141  K 3.2*  CL 106  CO2 28  GLUCOSE 102*  BUN 14  CREATININE 0.78  CALCIUM 9.2  MG 1.9   GFR: Estimated Creatinine Clearance: 51.8 mL/min (by C-G formula based on SCr of 0.78 mg/dL).  Liver Function Tests: Recent Labs  Lab 10/19/20 0600  AST 37  ALT 24  ALKPHOS 62  BILITOT 0.8  PROT 6.9  ALBUMIN 2.9*   No results for input(s): LIPASE, AMYLASE in the last 168 hours. No results for input(s): AMMONIA in the last 168 hours. Coagulation Profile: Recent Labs  Lab 10/19/20 0600 10/19/20 0827  INR 1.3* 1.3*   Cardiac Enzymes: No results for input(s): CKTOTAL, CKMB, CKMBINDEX, TROPONINI in the last 168 hours. BNP (last 3 results) No results for input(s): PROBNP in the last 8760 hours. HbA1C: No results for input(s): HGBA1C in the last 72 hours. CBG: Recent Labs  Lab 10/12/20 1222  GLUCAP 120*   Lipid Profile: No results for input(s): CHOL, HDL, LDLCALC, TRIG, CHOLHDL, LDLDIRECT in the last 72 hours. Thyroid Function Tests: No results for input(s): TSH, T4TOTAL, FREET4, T3FREE, THYROIDAB in the last 72 hours. Anemia Panel: No results for input(s): VITAMINB12, FOLATE, FERRITIN, TIBC, IRON, RETICCTPCT in the last 72 hours. Urine analysis:    Component  Value Date/Time   COLORURINE YELLOW 10/09/2020 1049   APPEARANCEUR HAZY (A) 10/09/2020 1049   LABSPEC 1.019 10/09/2020 1049   PHURINE 6.0 10/09/2020 1049   GLUCOSEU NEGATIVE 10/09/2020 1049   HGBUR NEGATIVE 10/09/2020 1049   BILIRUBINUR NEGATIVE 10/09/2020 1049   KETONESUR 20 (A) 10/09/2020 1049   PROTEINUR NEGATIVE 10/09/2020 1049   NITRITE NEGATIVE 10/09/2020 1049   LEUKOCYTESUR NEGATIVE 10/09/2020 1049    Radiological Exams on Admission: CT CHEST W CONTRAST  Result Date: 10/19/2020 CLINICAL DATA:  Abdominal bruising to the right hip, fall EXAM: CT CHEST, ABDOMEN, AND PELVIS WITH CONTRAST TECHNIQUE: Multidetector CT imaging of the chest, abdomen and pelvis was performed following the standard protocol during bolus administration of intravenous contrast. CONTRAST:  75mL OMNIPAQUE IOHEXOL 300 MG/ML  SOLN COMPARISON:  CT pelvis 10/09/2020 FINDINGS: CT CHEST FINDINGS Lines/tubes: There is a left chest wall cardiac device in place with leads terminating in the right atrium and right ventricle. Cardiovascular: The heart is at the upper limits of normal for size. There is no pericardial effusion. There is scattered calcified atherosclerotic plaque throughout the aortic arch and thoracic aorta. Mediastinum/Nodes: There is no mediastinal, hilar, or axillary lymphadenopathy. Lungs/Pleura: The trachea and central airways are unremarkable. The lungs are well inflated. There is no focal consolidation or pulmonary edema. There is no evidence of traumatic injury to the lungs. There is mild dependent subsegmental atelectasis in the lung bases. There is a 5 mm nodule in the medial right apex (4-39). There is a 9 mm ground-glass nodule in the left lung apex (4-54). There is a small calcified granuloma in the right upper lobe posteriorly. Musculoskeletal: There is no evidence of fracture or dislocation in the thorax. There is no suspicious osseous lesion. CT ABDOMEN PELVIS FINDINGS Hepatobiliary: There are scattered  tiny hypodense lesions throughout the liver, too small to characterize. The gallbladder is unremarkable. There is mild intrahepatic biliary ductal dilatation. Pancreas: Normal. Spleen: Normal. Adrenals/Urinary Tract: The adrenals are normal. There is a 1.3 cm hypodense lesion in the left kidney upper pole, indeterminate by Hounsfield units but likely a cyst or minimally complex cyst. A 0.8 cm lesion in the left kidney lower pole is too small to characterize. There is no hydronephrosis or hydroureter. The bladder is unremarkable Stomach/Bowel: The stomach is unremarkable. There is no evidence of bowel obstruction. There are scattered colonic diverticuli without evidence of acute diverticulitis. Vascular/Lymphatic: There is scattered calcified atherosclerotic plaque throughout the nonaneurysmal abdominal aorta. The  main portal and splenic veins are patent. There is no abdominal or pelvic lymphadenopathy. Reproductive: Status post hysterectomy. No adnexal masses. Other: There is a small amount of presacral free fluid, also present on the prior CT pelvis of 10/09/2020 but slightly increased in volume. There is no organized or drainable fluid collection. There is no free intraperitoneal air. Musculoskeletal: The patient is status post left hip arthroplasty for treatment of a recent proximal femoral fracture. Hardware alignment appears within expected limits, though the inferior extent is incompletely imaged. There is no evidence of new perihardware fracture. Edema and soft tissue gas about the left hip is likely related to the recent procedure. There is mild degenerative change in the lower lumbar spine. There is no evidence of vertebral fracture. There is skin thickening over the left buttock, unchanged. IMPRESSION: 1. Small amount of free presacral fluid, slightly increased in volume since 10/09/2020, nonspecific but of uncertain etiology. No evidence of traumatic injury to the abdominal or pelvic viscera. 2.  Postsurgical changes reflecting left arthroplasty for treatment of a recent proximal femoral fracture without evidence of complication or new fracture. 3. Mild intrahepatic biliary ductal dilatation; no obstructing lesion seen. Correlate with LFTs. 4. Diverticulosis without evidence of acute diverticulitis. 5. Skin thickening over the left buttock is unchanged; correlate with physical exam. 6. 9 mm ground-glass nodule in the left lung apex. Initial follow-up with CT at 6-12 months is recommended to confirm persistence. If persistent, repeat CT is recommended every 2 years until 5 years of stability has been established. This recommendation follows the consensus statement: Guidelines for Management of Incidental Pulmonary Nodules Detected on CT Images: From the Fleischner Society 2017; Radiology 2017; 284:228-243. 7.  Aortic Atherosclerosis (ICD10-I70.0). Electronically Signed   By: Lesia Hausen MD   On: 10/19/2020 08:36   CT ABDOMEN PELVIS W CONTRAST  Result Date: 10/19/2020 CLINICAL DATA:  Abdominal bruising to the right hip, fall EXAM: CT CHEST, ABDOMEN, AND PELVIS WITH CONTRAST TECHNIQUE: Multidetector CT imaging of the chest, abdomen and pelvis was performed following the standard protocol during bolus administration of intravenous contrast. CONTRAST:  16mL OMNIPAQUE IOHEXOL 300 MG/ML  SOLN COMPARISON:  CT pelvis 10/09/2020 FINDINGS: CT CHEST FINDINGS Lines/tubes: There is a left chest wall cardiac device in place with leads terminating in the right atrium and right ventricle. Cardiovascular: The heart is at the upper limits of normal for size. There is no pericardial effusion. There is scattered calcified atherosclerotic plaque throughout the aortic arch and thoracic aorta. Mediastinum/Nodes: There is no mediastinal, hilar, or axillary lymphadenopathy. Lungs/Pleura: The trachea and central airways are unremarkable. The lungs are well inflated. There is no focal consolidation or pulmonary edema. There is no  evidence of traumatic injury to the lungs. There is mild dependent subsegmental atelectasis in the lung bases. There is a 5 mm nodule in the medial right apex (4-39). There is a 9 mm ground-glass nodule in the left lung apex (4-54). There is a small calcified granuloma in the right upper lobe posteriorly. Musculoskeletal: There is no evidence of fracture or dislocation in the thorax. There is no suspicious osseous lesion. CT ABDOMEN PELVIS FINDINGS Hepatobiliary: There are scattered tiny hypodense lesions throughout the liver, too small to characterize. The gallbladder is unremarkable. There is mild intrahepatic biliary ductal dilatation. Pancreas: Normal. Spleen: Normal. Adrenals/Urinary Tract: The adrenals are normal. There is a 1.3 cm hypodense lesion in the left kidney upper pole, indeterminate by Hounsfield units but likely a cyst or minimally complex cyst. A 0.8 cm  lesion in the left kidney lower pole is too small to characterize. There is no hydronephrosis or hydroureter. The bladder is unremarkable Stomach/Bowel: The stomach is unremarkable. There is no evidence of bowel obstruction. There are scattered colonic diverticuli without evidence of acute diverticulitis. Vascular/Lymphatic: There is scattered calcified atherosclerotic plaque throughout the nonaneurysmal abdominal aorta. The main portal and splenic veins are patent. There is no abdominal or pelvic lymphadenopathy. Reproductive: Status post hysterectomy. No adnexal masses. Other: There is a small amount of presacral free fluid, also present on the prior CT pelvis of 10/09/2020 but slightly increased in volume. There is no organized or drainable fluid collection. There is no free intraperitoneal air. Musculoskeletal: The patient is status post left hip arthroplasty for treatment of a recent proximal femoral fracture. Hardware alignment appears within expected limits, though the inferior extent is incompletely imaged. There is no evidence of new  perihardware fracture. Edema and soft tissue gas about the left hip is likely related to the recent procedure. There is mild degenerative change in the lower lumbar spine. There is no evidence of vertebral fracture. There is skin thickening over the left buttock, unchanged. IMPRESSION: 1. Small amount of free presacral fluid, slightly increased in volume since 10/09/2020, nonspecific but of uncertain etiology. No evidence of traumatic injury to the abdominal or pelvic viscera. 2. Postsurgical changes reflecting left arthroplasty for treatment of a recent proximal femoral fracture without evidence of complication or new fracture. 3. Mild intrahepatic biliary ductal dilatation; no obstructing lesion seen. Correlate with LFTs. 4. Diverticulosis without evidence of acute diverticulitis. 5. Skin thickening over the left buttock is unchanged; correlate with physical exam. 6. 9 mm ground-glass nodule in the left lung apex. Initial follow-up with CT at 6-12 months is recommended to confirm persistence. If persistent, repeat CT is recommended every 2 years until 5 years of stability has been established. This recommendation follows the consensus statement: Guidelines for Management of Incidental Pulmonary Nodules Detected on CT Images: From the Fleischner Society 2017; Radiology 2017; 284:228-243. 7.  Aortic Atherosclerosis (ICD10-I70.0). Electronically Signed   By: Lesia HausenPeter  Noone MD   On: 10/19/2020 08:36    EKG: Independently reviewed. Atrial paced rhythm   Assessment/Plan Principal Problem:   GI hemorrhage Active Problems:   Cerebrovascular accident (CVA) with involvement of left side of body (HCC)   Hypertension   Type 2 diabetes mellitus (HCC)   Hyperlipidemia   Dysphagia, post-stroke   Atrial fibrillation (HCC)   Hypertension associated with type 2 diabetes mellitus (HCC)   Hyperlipidemia associated with type 2 diabetes mellitus (HCC)   Chronic constipation   Seizure disorder (HCC)   Chronic blood loss  anemia   Presumed upper GI hemorrhage -I spoke with gastroenterologist plan is to continue IV Protonix at this time.  Clear liquid diet ordered.  Anticipating EGD 10/20/2020.  Further recommendations to follow.  Hold apixaban.  Type 2 diabetes mellitus-has been well controlled continue SSI coverage and frequent CBG monitoring.  Chronic atrial fibrillation-resume home rate control medications and hold apixaban.   Epilepsy-resume home antiepileptic therapy.  Seizure precautions.  Chronic blood loss anemia-follow-up on anemia panel.  Hyperlipidemia-resume home statin therapy given recent CVA.  Essential hypertension-resume home blood pressure lowering medications.  Chronic constipation-resume home daily laxative therapy.    Hypokalemia - IV replacement ordered, check magnesium.    DVT prophylaxis: SCDs Code Status: Full Family Communication: Telephone call update son 10/19/20 Disposition Plan: Anticipate return to SNF when medically stabilized Consults called: Gastroenterology team Admission status: Inpatient Level  of care: Telemetry Standley Dakins MD [facility] How to contact the [facility] Attending or Consulting provider 7A - 7P or covering provider during after hours 7P -7A, for this patient?  Check the care team in [facility] and look for a) attending/consulting [facility] provider listed and b) the [facility] team listed Log into [email or web address] and use [place]'s universal password to access. If you do not have the password, please contact the hospital operator. Locate the [facility] provider you are looking for under [facility] and page to a number that you can be directly reached. If you still have difficulty reaching the provider, please page the [facility] (Director on Call) for the Hospitalists listed on amion for assistance.   If 7PM-7AM, please contact night-coverage [email or web address] Password [email or web address]  10/19/2020, 12:10 PM

## 2020-10-19 NOTE — ED Notes (Signed)
Pt washed up and cleaned with brief changed.

## 2020-10-19 NOTE — Consult Note (Signed)
Referring Provider: Standley Dakins, MD Primary Care Physician:  Benita Stabile, MD Primary Gastroenterologist:  Dr. Karilyn Cota  Reason for Consultation:    Anemia and heme positive stool.  HPI:   Patient is 75 year old African-American female who was transferred from [facility] to emergency room for evaluation of anemia.  Patient was admitted to this facility on 10/09/2020 for left hip fracture resulting from a fall after she was trying to stand up after using the bathroom.  Her hemoglobin then was 13.5 g.  Patient underwent left hip Hemi arthroplasty on 10/10/2020.  She was discharged and transferred to [facility] for rehabilitation.  During this hospitalization patient's hemoglobin kept dropping.  Hemoglobin was 8 g on the day of discharge.  As recommended patient had follow-up H&H.  4 days ago was 7.1 and yesterday it was 6.4. In emergency room she was noted to have heme positive stool.  Patient has a history of atrial fibrillation and is anticoagulated. She also had a chest and abdominal pelvic CT.  Abdominal pelvic CT reveals small amount of free presacral fluid slightly increased since prior study of 10/09/2020.  Felt to be nonspecific.  Postsurgical changes reflecting left arthroplasty.  Mild intrahepatic dilation.  Colonic diverticulosis without diverticulitis.  Chest CT revealed 9 mm groundglass nodule at left lung apex.  Follow-up recommended. Patient denies nausea vomiting heartburn dysphagia melena or rectal bleeding.  She also denies hematuria or vaginal bleeding.  She says her appetite has been good and she has not had any abdominal pain. She says she had screening colonoscopy in 2019 and it was normal. Patient denies history of peptic ulcer disease.  She does not take aspirin or OTC NSAIDs.  She has good appetite.  She has not lost any weight recently.  She has not been able to use her left hand since she has stroke about 2 years ago.  She has felt weak since she left the  hospital. Patient is hoping to return home when she finishes rehab.  Her son lives with her.  She also has nursing aide 16 hours a day. She is divorced.  She only has 1 son as above.  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  She does not drink alcohol.  She quit cigarette smoking over 30 years ago.  She smoked anywhere from half to 2 packs/day.  Mother lived to be 83.  She had a stroke.  Father died at in his 28s because of heart attack.  She had 3 sisters and they are all disease.  1 had pneumonia while had a stroke and another 1 had an MI.    Past Medical History:  Diagnosis Date   Atrial fibrillation [facility])    CVA (cerebral vascular accident) (HCC)    High cholesterol    Hypertension    Seizures (HCC)    Type 2 diabetes mellitus (HCC)     Past Surgical History:  Procedure Laterality Date   ABDOMINAL HYSTERECTOMY     HIP ARTHROPLASTY Left 10/10/2020   Procedure: ARTHROPLASTY BIPOLAR HIP (HEMIARTHROPLASTY);  Surgeon: Oliver Barre, MD;  Location: [facility] ORS;  Service: Orthopedics;  Laterality: Left;   PACEMAKER IMPLANT N/A 08/22/2020   Procedure: PACEMAKER IMPLANT;  Surgeon: Marinus Maw, MD;  Location: [facility] INVASIVE CV LAB;  Service: Cardiovascular;  Laterality: N/A;   TUBAL LIGATION      Prior to Admission medications   Medication Sig Start Date End Date Taking? Authorizing Provider  acetaminophen (TYLENOL)  500 MG tablet Take 1,000 mg by mouth every 8 (eight) hours as needed for moderate pain or headache.   Yes [provider]  amiodarone (PACERONE) 100 MG tablet Take 1 tablet (100 mg total) by mouth daily. 05/17/20  Yes Leone Brand, NP  apixaban (ELIQUIS) 5 MG TABS tablet Take 1 tablet (5 mg total) by mouth 2 (two) times daily. 10/13/20 10/08/21 Yes Emokpae, Courage, MD  atorvastatin (LIPITOR) 80 MG tablet Take 1 tablet (80 mg total) by mouth daily at 6 PM. 09/27/18 10/18/48 Yes Amin, Loura Halt, MD  bisacodyl (DULCOLAX) 10 MG  suppository Place 1 suppository (10 mg total) rectally every Monday, Wednesday, and Friday. 10/12/20  Yes Emokpae, Courage, MD  Ensure (ENSURE) Take 237 mLs by mouth daily at 12 noon.   Yes [provider]  ferrous sulfate 325 (65 FE) MG EC tablet Take 1 tablet (325 mg total) by mouth daily with breakfast. 10/12/20  Yes Emokpae, Courage, MD  levETIRAcetam (KEPPRA) 500 MG tablet Take 500 mg by mouth 2 (two) times daily.   Yes [provider]  metoprolol tartrate (LOPRESSOR) 25 MG tablet Take 0.5 tablets (12.5 mg total) by mouth 2 (two) times daily. 10/12/20  Yes Emokpae, Courage, MD  oxyCODONE (OXY IR/ROXICODONE) 5 MG immediate release tablet Take 5 mg by mouth daily as needed for severe pain.   Yes [provider]  senna-docusate (SENOKOT-S) 8.6-50 MG tablet Take 2 tablets by mouth 2 (two) times daily. 10/12/20  Yes Shon Hale, MD  Lucilla Lame [place]-Castor Oil [facility]) OINT Apply 1 application topically as directed. Apply to sacrum and coccyx qshift for prevention. Every Shift; Day, Evening, Night 10/17/20   [provider]  BOTOX 100 units SOLR injection Inject 100 Units into the muscle every 3 (three) months. Patient not taking: No sig reported 09/19/20   [provider]  NON FORMULARY Diet: No added salt, ConCHO; lactose intolerant    [provider]    Current Facility-Administered Medications  Medication Dose Route Frequency Provider Last Rate Last Admin   0.9 %  sodium chloride infusion (Manually program via Guardrails IV Fluids)   Intravenous Once Pecola Lawless, MD       0.9 %  sodium chloride infusion (Manually program via Guardrails IV Fluids)   Intravenous Once Pecola Lawless, MD       0.9 % NaCl with KCl 20 mEq/ L  infusion   Intravenous Continuous Johnson, Clanford L, MD       acetaminophen (TYLENOL) tablet 650 mg  650 mg Oral Q6H PRN Johnson, Clanford L, MD       Or   acetaminophen (TYLENOL) suppository 650 mg  650 mg Rectal Q6H  PRN Johnson, Clanford L, MD       amiodarone (PACERONE) tablet 100 mg  100 mg Oral Daily Johnson, Clanford L, MD   100 mg at 10/19/20 1039   atorvastatin (LIPITOR) tablet 80 mg  80 mg Oral q1800 Johnson, Clanford L, MD       [START ON 10/20/2020] ferrous sulfate tablet 325 mg  325 mg Oral Q breakfast Johnson, Clanford L, MD       insulin aspart (novoLOG) injection 0-9 Units  0-9 Units Subcutaneous TID WC Johnson, Clanford L, MD       levETIRAcetam (KEPPRA) tablet 500 mg  500 mg Oral BID Johnson, Clanford L, MD   500 mg at 10/19/20 1039   metoprolol tartrate (LOPRESSOR) tablet 12.5 mg  12.5 mg Oral BID Johnson, Clanford L,  MD   12.5 mg at 10/19/20 1039   ondansetron (ZOFRAN) tablet 4 mg  4 mg Oral Q6H PRN Johnson, Clanford L, MD       Or   ondansetron (ZOFRAN) injection 4 mg  4 mg Intravenous Q6H PRN Johnson, Clanford L, MD       pantoprazole (PROTONIX) injection 40 mg  40 mg Intravenous Q12H Johnson, Clanford L, MD   40 mg at 10/19/20 0942   potassium chloride 10 mEq in 100 mL IVPB  10 mEq Intravenous Q1 Hr x 3 Johnson, Clanford L, MD       Current Outpatient Medications  Medication Sig Dispense Refill   acetaminophen (TYLENOL) 500 MG tablet Take 1,000 mg by mouth every 8 (eight) hours as needed for moderate pain or headache.     amiodarone (PACERONE) 100 MG tablet Take 1 tablet (100 mg total) by mouth daily. 90 tablet 3   apixaban (ELIQUIS) 5 MG TABS tablet Take 1 tablet (5 mg total) by mouth 2 (two) times daily. 60 tablet 11   atorvastatin (LIPITOR) 80 MG tablet Take 1 tablet (80 mg total) by mouth daily at 6 PM. 30 tablet 1   bisacodyl (DULCOLAX) 10 MG suppository Place 1 suppository (10 mg total) rectally every Monday, Wednesday, and Friday. 15 suppository 2   Ensure (ENSURE) Take 237 mLs by mouth daily at 12 noon.     ferrous sulfate 325 (65 FE) MG EC tablet Take 1 tablet (325 mg total) by mouth daily with breakfast. 90 tablet 3   levETIRAcetam (KEPPRA) 500 MG tablet Take 500 mg by mouth 2  (two) times daily.     metoprolol tartrate (LOPRESSOR) 25 MG tablet Take 0.5 tablets (12.5 mg total) by mouth 2 (two) times daily. 60 tablet 3   oxyCODONE (OXY IR/ROXICODONE) 5 MG immediate release tablet Take 5 mg by mouth daily as needed for severe pain.     senna-docusate (SENOKOT-S) 8.6-50 MG tablet Take 2 tablets by mouth 2 (two) times daily. 120 tablet 2   Balsam [place]-Castor Oil (VENELEX) OINT Apply 1 application topically as directed. Apply to sacrum and coccyx qshift for prevention. Every Shift; Day, Evening, Night     BOTOX 100 units SOLR injection Inject 100 Units into the muscle every 3 (three) months. (Patient not taking: No sig reported)     NON FORMULARY Diet: No added salt, ConCHO; lactose intolerant      Allergies as of 10/19/2020 - Review Complete 10/19/2020  Allergen Reaction Noted   Lactose intolerance (gi) Other (See Comments) 10/05/2018    Family History  Problem Relation Age of Onset   Stroke Mother    Heart attack Father    Stroke Sister    Heart attack Sister     Social History   Socioeconomic History   Marital status: Divorced    Spouse name: Not on file   Number of children: Not on file   Years of education: Not on file   Highest education level: Not on file  Occupational History   Not on file  Tobacco Use   Smoking status: Former    Types: Cigarettes    Quit date: 04/14/1988    Years since quitting: 32.5   Smokeless tobacco: Never  Vaping Use   Vaping Use: Never used  Substance and Sexual Activity   Alcohol use: Never   Drug use: Never   Sexual activity: Not on file  Other Topics Concern   Not on file  Social History Narrative  Not on file   Social Determinants of Health   Financial Resource Strain: Not on file  Food Insecurity: Not on file  Transportation Needs: Not on file  Physical Activity: Not on file  Stress: Not on file  Social Connections: Not on file  Intimate Partner Violence: Not on file    Review of Systems: See HPI,  otherwise normal ROS  Physical Exam: Temp:  [98.4 F (36.9 C)-98.9 F (37.2 C)] 98.8 F (37.1 C) (08/05 1030) Pulse Rate:  [59-70] 59 (08/05 1215) Resp:  [14-20] 17 (08/05 1215) BP: (139-158)/(63-78) 154/63 (08/05 1215) SpO2:  [100 %] 100 % (08/05 1215) Weight:  [55.8 kg] 55.8 kg (08/05 0542)   Patient is alert and in no acute distress. Conjunctiva is pale.  Sclera is nonicteric. Oropharyngeal mucosa is normal. Neck without masses or thyromegaly. Cardiac exam with irregular rhythm normal S1 and S2.  No murmur or gallop noted. Auscultation lungs reveal vesicular breath sounds bilaterally. Abdomen is symmetrical soft and nontender with organomegaly or masses. She does not have clubbing or koilonychia. She has spastic left upper extremity with flexion at elbow.  No strength. She she is weak in her left leg.  She is able to lift her foot off the bed.  Lab Results: Recent Labs    10/18/20 0700 10/19/20 0710  WBC  --  8.3  HGB 6.4* 6.8*  HCT 21.4* 23.5*  PLT  --  288   BMET Recent Labs    10/19/20 0600  NA 141  K 3.2*  CL 106  CO2 28  GLUCOSE 102*  BUN 14  CREATININE 0.78  CALCIUM 9.2   LFT Recent Labs    10/19/20 0600  PROT 6.9  ALBUMIN 2.9*  AST 37  ALT 24  ALKPHOS 62  BILITOT 0.8   PT/INR Recent Labs    10/19/20 0600 10/19/20 0827  LABPROT 16.0* 15.8*  INR 1.3* 1.3*   Hepatitis Panel No results for input(s): HEPBSAG, HCVAB, HEPAIGM, HEPBIGM in the last 72 hours.  Studies/Results: CT CHEST W CONTRAST  Result Date: 10/19/2020 CLINICAL DATA:  Abdominal bruising to the right hip, fall EXAM: CT CHEST, ABDOMEN, AND PELVIS WITH CONTRAST TECHNIQUE: Multidetector CT imaging of the chest, abdomen and pelvis was performed following the standard protocol during bolus administration of intravenous contrast. CONTRAST:  22mL OMNIPAQUE IOHEXOL 300 MG/ML  SOLN COMPARISON:  CT pelvis 10/09/2020 FINDINGS: CT CHEST FINDINGS Lines/tubes: There is a left chest wall  cardiac device in place with leads terminating in the right atrium and right ventricle. Cardiovascular: The heart is at the upper limits of normal for size. There is no pericardial effusion. There is scattered calcified atherosclerotic plaque throughout the aortic arch and thoracic aorta. Mediastinum/Nodes: There is no mediastinal, hilar, or axillary lymphadenopathy. Lungs/Pleura: The trachea and central airways are unremarkable. The lungs are well inflated. There is no focal consolidation or pulmonary edema. There is no evidence of traumatic injury to the lungs. There is mild dependent subsegmental atelectasis in the lung bases. There is a 5 mm nodule in the medial right apex (4-39). There is a 9 mm ground-glass nodule in the left lung apex (4-54). There is a small calcified granuloma in the right upper lobe posteriorly. Musculoskeletal: There is no evidence of fracture or dislocation in the thorax. There is no suspicious osseous lesion. CT ABDOMEN PELVIS FINDINGS Hepatobiliary: There are scattered tiny hypodense lesions throughout the liver, too small to characterize. The gallbladder is unremarkable. There is mild intrahepatic biliary ductal dilatation. Pancreas:  Normal. Spleen: Normal. Adrenals/Urinary Tract: The adrenals are normal. There is a 1.3 cm hypodense lesion in the left kidney upper pole, indeterminate by Hounsfield units but likely a cyst or minimally complex cyst. A 0.8 cm lesion in the left kidney lower pole is too small to characterize. There is no hydronephrosis or hydroureter. The bladder is unremarkable Stomach/Bowel: The stomach is unremarkable. There is no evidence of bowel obstruction. There are scattered colonic diverticuli without evidence of acute diverticulitis. Vascular/Lymphatic: There is scattered calcified atherosclerotic plaque throughout the nonaneurysmal abdominal aorta. The main portal and splenic veins are patent. There is no abdominal or pelvic lymphadenopathy. Reproductive:  Status post hysterectomy. No adnexal masses. Other: There is a small amount of presacral free fluid, also present on the prior CT pelvis of 10/09/2020 but slightly increased in volume. There is no organized or drainable fluid collection. There is no free intraperitoneal air. Musculoskeletal: The patient is status post left hip arthroplasty for treatment of a recent proximal femoral fracture. Hardware alignment appears within expected limits, though the inferior extent is incompletely imaged. There is no evidence of new perihardware fracture. Edema and soft tissue gas about the left hip is likely related to the recent procedure. There is mild degenerative change in the lower lumbar spine. There is no evidence of vertebral fracture. There is skin thickening over the left buttock, unchanged. IMPRESSION: 1. Small amount of free presacral fluid, slightly increased in volume since 10/09/2020, nonspecific but of uncertain etiology. No evidence of traumatic injury to the abdominal or pelvic viscera. 2. Postsurgical changes reflecting left arthroplasty for treatment of a recent proximal femoral fracture without evidence of complication or new fracture. 3. Mild intrahepatic biliary ductal dilatation; no obstructing lesion seen. Correlate with LFTs. 4. Diverticulosis without evidence of acute diverticulitis. 5. Skin thickening over the left buttock is unchanged; correlate with physical exam. 6. 9 mm ground-glass nodule in the left lung apex. Initial follow-up with CT at 6-12 months is recommended to confirm persistence. If persistent, repeat CT is recommended every 2 years until 5 years of stability has been established. This recommendation follows the consensus statement: Guidelines for Management of Incidental Pulmonary Nodules Detected on CT Images: From the Fleischner Society 2017; Radiology 2017; 284:228-243. 7.  Aortic Atherosclerosis (ICD10-I70.0). Electronically Signed   By: Lesia Hausen MD   On: 10/19/2020 08:36    CT ABDOMEN PELVIS W CONTRAST  Result Date: 10/19/2020 CLINICAL DATA:  Abdominal bruising to the right hip, fall EXAM: CT CHEST, ABDOMEN, AND PELVIS WITH CONTRAST TECHNIQUE: Multidetector CT imaging of the chest, abdomen and pelvis was performed following the standard protocol during bolus administration of intravenous contrast. CONTRAST:  75mL OMNIPAQUE IOHEXOL 300 MG/ML  SOLN COMPARISON:  CT pelvis 10/09/2020 FINDINGS: CT CHEST FINDINGS Lines/tubes: There is a left chest wall cardiac device in place with leads terminating in the right atrium and right ventricle. Cardiovascular: The heart is at the upper limits of normal for size. There is no pericardial effusion. There is scattered calcified atherosclerotic plaque throughout the aortic arch and thoracic aorta. Mediastinum/Nodes: There is no mediastinal, hilar, or axillary lymphadenopathy. Lungs/Pleura: The trachea and central airways are unremarkable. The lungs are well inflated. There is no focal consolidation or pulmonary edema. There is no evidence of traumatic injury to the lungs. There is mild dependent subsegmental atelectasis in the lung bases. There is a 5 mm nodule in the medial right apex (4-39). There is a 9 mm ground-glass nodule in the left lung apex (4-54). There is a  small calcified granuloma in the right upper lobe posteriorly. Musculoskeletal: There is no evidence of fracture or dislocation in the thorax. There is no suspicious osseous lesion. CT ABDOMEN PELVIS FINDINGS Hepatobiliary: There are scattered tiny hypodense lesions throughout the liver, too small to characterize. The gallbladder is unremarkable. There is mild intrahepatic biliary ductal dilatation. Pancreas: Normal. Spleen: Normal. Adrenals/Urinary Tract: The adrenals are normal. There is a 1.3 cm hypodense lesion in the left kidney upper pole, indeterminate by Hounsfield units but likely a cyst or minimally complex cyst. A 0.8 cm lesion in the left kidney lower pole is too small  to characterize. There is no hydronephrosis or hydroureter. The bladder is unremarkable Stomach/Bowel: The stomach is unremarkable. There is no evidence of bowel obstruction. There are scattered colonic diverticuli without evidence of acute diverticulitis. Vascular/Lymphatic: There is scattered calcified atherosclerotic plaque throughout the nonaneurysmal abdominal aorta. The main portal and splenic veins are patent. There is no abdominal or pelvic lymphadenopathy. Reproductive: Status post hysterectomy. No adnexal masses. Other: There is a small amount of presacral free fluid, also present on the prior CT pelvis of 10/09/2020 but slightly increased in volume. There is no organized or drainable fluid collection. There is no free intraperitoneal air. Musculoskeletal: The patient is status post left hip arthroplasty for treatment of a recent proximal femoral fracture. Hardware alignment appears within expected limits, though the inferior extent is incompletely imaged. There is no evidence of new perihardware fracture. Edema and soft tissue gas about the left hip is likely related to the recent procedure. There is mild degenerative change in the lower lumbar spine. There is no evidence of vertebral fracture. There is skin thickening over the left buttock, unchanged. IMPRESSION: 1. Small amount of free presacral fluid, slightly increased in volume since 10/09/2020, nonspecific but of uncertain etiology. No evidence of traumatic injury to the abdominal or pelvic viscera. 2. Postsurgical changes reflecting left arthroplasty for treatment of a recent proximal femoral fracture without evidence of complication or new fracture. 3. Mild intrahepatic biliary ductal dilatation; no obstructing lesion seen. Correlate with LFTs. 4. Diverticulosis without evidence of acute diverticulitis. 5. Skin thickening over the left buttock is unchanged; correlate with physical exam. 6. 9 mm ground-glass nodule in the left lung apex. Initial  follow-up with CT at 6-12 months is recommended to confirm persistence. If persistent, repeat CT is recommended every 2 years until 5 years of stability has been established. This recommendation follows the consensus statement: Guidelines for Management of Incidental Pulmonary Nodules Detected on CT Images: From the Fleischner Society 2017; Radiology 2017; 284:228-243. 7.  Aortic Atherosclerosis (ICD10-I70.0). Electronically Signed   By: Lesia HausenPeter  Noone MD   On: 10/19/2020 08:36    Assessment;  Patient is 75 year old African-American female who presents with progressive anemia.  Her hemoglobin was normal on 10/09/2020 when she presented to emergency room with left hip fracture resulting from a fall.  Her hemoglobin Dropping during hospitalization and now down to 6.4 and 6.8.  She has atrial fibrillation and is anticoagulated.  Her stool was noted to be heme positive in the emergency room but but there is no history of melena or rectal bleeding hematuria or vaginal bleeding. Acute anemia clearly is not solely due to GI bleed.  1 would expect gross GI bleeding with such dramatic drop in H&H over a period of few days.  1 has to wonder if presacral fluid is in need blood.  This also would not explain this drop.  Therefore her anemia is multifactorial and  occult GI bleed is contributory. She has received 1 unit of PRBCs. She could have peptic ulcer disease given recent acute illness she could also have GI angiodysplasia or occult neoplasm.  Patient initially wanted to only have EGD to begin with.  After talking with her niece DeeDee Leandro Reasoner she agrees to proceed with EGD followed by colonoscopy on the same day.  Mildly dilated biliary system.  LFTs are normal.  She does not have any symptoms suggest biliary tract disease.  History of atrial fibrillation.  Anticoagulant on hold.  History of CVA with left hemiparesis about 2 years ago.  Recommendations;  Transfuse 1 or 2 units to get her hemoglobin above 8  g. Continue to hold apixaban. Esophagogastroduodenoscopy followed by colonoscopy to be performed on 10/20/2020.  She will be prepped with GoLytely.   LOS: 0 days   Jaxten Brosh  10/19/2020, 12:54 PM

## 2020-10-19 NOTE — ED Provider Notes (Signed)
[facility]-EMERGENCY DEPT [facility] Emergency Department Provider Note MRN:  [id number]  Arrival date & time: 10/19/20     Chief Complaint   low Hbg   History of Present Illness   Rachel Fox is a 75 y.o. year-old female with a history of stroke, diabetes presenting to the ED with chief complaint of low hemoglobin.  Patient sent here for evaluation for low hemoglobin.  Explains that she has been feeling normal.  Denies any chest pain or shortness of breath, denies any bleeding, no blood in the stool, no black stool.  She does endorse a fall a few days ago with bruising to the right hip.  Also had a recent fall and fracture of the left hip status post repair.  Denies any head trauma or loss of consciousness, no neck or back pain, no abdominal pain.  Review of Systems  A complete 10 system review of systems was obtained and all systems are negative except as noted in the HPI and PMH.   Patient's Health History    Past Medical History:  Diagnosis Date   Atrial fibrillation [facility])    CVA (cerebral vascular accident) (HCC)    High cholesterol    Hypertension    Seizures (HCC)    Type 2 diabetes mellitus (HCC)     Past Surgical History:  Procedure Laterality Date   ABDOMINAL HYSTERECTOMY     HIP ARTHROPLASTY Left 10/10/2020   Procedure: ARTHROPLASTY BIPOLAR HIP (HEMIARTHROPLASTY);  Surgeon: Oliver Barre, MD;  Location: [facility] ORS;  Service: Orthopedics;  Laterality: Left;   PACEMAKER IMPLANT N/A 08/22/2020   Procedure: PACEMAKER IMPLANT;  Surgeon: Marinus Maw, MD;  Location: [facility] INVASIVE CV LAB;  Service: Cardiovascular;  Laterality: N/A;   TUBAL LIGATION      Family History  Problem Relation Age of Onset   Stroke Mother    Heart attack Father    Stroke Sister    Heart attack Sister     Social History   Socioeconomic History   Marital status: Divorced    Spouse name: Not on file   Number of children: Not on file   Years of education: Not on file   Highest education  level: Not on file  Occupational History   Not on file  Tobacco Use   Smoking status: Former    Types: Cigarettes    Quit date: 04/14/1988    Years since quitting: 32.5   Smokeless tobacco: Never  Vaping Use   Vaping Use: Never used  Substance and Sexual Activity   Alcohol use: Never   Drug use: Never   Sexual activity: Not on file  Other Topics Concern   Not on file  Social History Narrative   Not on file   Social Determinants of Health   Financial Resource Strain: Not on file  Food Insecurity: Not on file  Transportation Needs: Not on file  Physical Activity: Not on file  Stress: Not on file  Social Connections: Not on file  Intimate Partner Violence: Not on file     Physical Exam   Vitals:   10/19/20 0620 10/19/20 0640  BP:  (!) 142/69  Pulse:  70  Resp:  15  Temp: 98.9 F (37.2 C)   SpO2:  100%    CONSTITUTIONAL: Well-appearing, NAD NEURO:  Alert and oriented x 3, no focal deficits EYES:  eyes equal and reactive ENT/NECK:  no LAD, no JVD CARDIO: Regular rate, well-perfused, normal S1 and S2 PULM:  CTAB no wheezing  or rhonchi GI/GU:  normal bowel sounds, non-distended, non-tender MSK/SPINE:  No gross deformities, no edema SKIN:  no rash, atraumatic PSYCH:  Appropriate speech and behavior  *Additional and/or pertinent findings included in MDM below  Diagnostic and Interventional Summary    EKG Interpretation  Date/Time:  10-19-2020 at 05: 41: 38 Ventricular Rate:  69 PR Interval:  167 QRS Duration: 74 QT Interval: 412   QTC Calculation: 442 R Axis:     Text Interpretation: Atrial paced Confirmed by Dr. Kennis Carina at 5:50 AM       Labs Reviewed  COMPREHENSIVE METABOLIC PANEL - Abnormal; Notable for the following components:      Result Value   Potassium 3.2 (*)    Glucose, Bld 102 (*)    Albumin 2.9 (*)    All other components within normal limits  PROTIME-INR - Abnormal; Notable for the following components:   Prothrombin Time 16.0 (*)     INR 1.3 (*)    All other components within normal limits  POC OCCULT BLOOD, ED - Abnormal; Notable for the following components:   Fecal Occult Bld POSITIVE (*)    All other components within normal limits  APTT  CBC  TYPE AND SCREEN    CT CHEST W CONTRAST    (Results Pending)  CT ABDOMEN PELVIS W CONTRAST    (Results Pending)    Medications  iohexol (OMNIPAQUE) 300 MG/ML solution 75 mL (75 mLs Intravenous Contrast Given 10/19/20 0724)     Procedures  /  Critical Care Procedures  ED Course and Medical Decision Making  I have reviewed the triage vital signs, the nursing notes, and pertinent available records from the EMR.  Listed above are laboratory and imaging tests that I personally ordered, reviewed, and interpreted and then considered in my medical decision making (see below for details).  Consistent downtrend in hemoglobin coming from facility.  Hemoglobin 11.5 nine days ago and reportedly down to 6.4 today.  Considering traumatic bleeding given the recent fall and the bruising found to the right hip on exam.  And also considering GI loss.  Patient is Hemoccult positive on my exam today.  Awaiting labs, CT imaging.  Patient will likely need admission given her anticoagulated state.  Signed out to oncoming provider at shift change.       Elmer Sow. Pilar Plate, MD [facility] Emergency Medicine [facility] [email or web address]  Final Clinical Impressions(s) / ED Diagnoses     ICD-10-CM   1. Anemia, unspecified type  D64.9     2. Anticoagulated  Z79.01       ED Discharge Orders     None        Discharge Instructions Discussed with and Provided to Patient:   Discharge Instructions   None       Sabas Sous, MD 10/19/20 [phone]

## 2020-10-19 NOTE — TOC Initial Note (Signed)
Transition of Care [facility]) - Initial/Assessment Note    Patient Details  Name: Rachel Fox MRN: [id number] Date of Birth: [birth year]  Transition of [facility]) CM/SW Contact:    Elliot Gault, LCSW Phone Number: 10/19/2020, 1:00 PM  Clinical Narrative:                  Pt admitted from [facility] short term rehab. Spoke with Lynnea Ferrier at [facility] today to update. Per Lynnea Ferrier, they can accept pt back at dc. They will accept weekend dc if pt ready for dc at that time. Per Lynnea Ferrier, they do not need a new FL2.  TOC will follow.   Expected Discharge Plan: Skilled Nursing Facility Barriers to Discharge: Continued Medical Work up   Patient Goals and CMS Choice Patient states their goals for this hospitalization and ongoing recovery are:: return to rehab      Expected Discharge Plan and Services Expected Discharge Plan: Skilled Nursing Facility In-house Referral: Clinical Social Work     Living arrangements for the past 2 months: Single Family Home, Skilled Nursing Facility                                      Prior Living Arrangements/Services Living arrangements for the past 2 months: Single Family Home, Skilled Nursing Facility Lives with:: Self Patient language and need for interpreter reviewed:: Yes Do you feel safe going back to the place where you live?: Yes      Need for Family Participation in Patient Care: Yes (Comment) Care giver support system in place?: Yes (comment)   Criminal Activity/Legal Involvement Pertinent to Current Situation/Hospitalization: No - Comment as needed  Activities of Daily Living      Permission Sought/Granted                  Emotional Assessment       Orientation: : Oriented to Self, Oriented to Place, Oriented to  Time, Oriented to Situation Alcohol / Substance Use: Not Applicable Psych Involvement: No (comment)  Admission diagnosis:  GI hemorrhage [K92.2] Patient Active Problem List   Diagnosis Date Noted   GI hemorrhage  10/19/2020   Chronic blood loss anemia 10/19/2020   Hypertension associated with type 2 diabetes mellitus (HCC) 10/15/2020   Hyperlipidemia associated with type 2 diabetes mellitus (HCC) 10/15/2020   Chronic constipation 10/15/2020   Seizure disorder (HCC) 10/15/2020   Acute blood loss as cause of postoperative anemia 10/15/2020   Hip fx (HCC) 10/09/2020   Sinus node dysfunction (HCC) 08/22/2020   Spastic hemiparesis (HCC)    Acute pain of left shoulder    Controlled type 2 diabetes mellitus with hyperglycemia, without long-term current use of insulin (HCC)    Dysphagia, post-stroke    Atrial fibrillation (HCC)    Right middle cerebral artery stroke (HCC) 09/27/2018   Cerebrovascular accident (CVA) with involvement of left side of body (HCC) 09/20/2018   Hypertension    Type 2 diabetes mellitus (HCC)    Hyperlipidemia    PCP:  Benita Stabile, MD Pharmacy:   [facility] [phone] - [place],  - [address]  [place] [address] Phone: [phone] Fax: [phone]  [facility] - [place], [place] - [address] [place] [address] Phone: [phone] Fax: [phone]  [facility] - [place], [place] - [address] [place] [address] Phone: [phone] Fax: [phone]     Social Determinants of Health (SDOH) Interventions    Readmission Risk Interventions No flowsheet data found.

## 2020-10-20 ENCOUNTER — Encounter (HOSPITAL_COMMUNITY)
Admission: EM | Disposition: A | Discharge status: Home / Self Care | Payer: Self-pay | Source: Home / Self Care | Attending: Family Medicine

## 2020-10-20 ENCOUNTER — Inpatient Hospital Stay (HOSPITAL_COMMUNITY): Payer: Medicare Other | Admitting: Anesthesiology

## 2020-10-20 DIAGNOSIS — K2289 Other specified disease of esophagus: Secondary | ICD-10-CM

## 2020-10-20 DIAGNOSIS — Z96642 Presence of left artificial hip joint: Secondary | ICD-10-CM

## 2020-10-20 DIAGNOSIS — I69354 Hemiplegia and hemiparesis following cerebral infarction affecting left non-dominant side: Secondary | ICD-10-CM | POA: Diagnosis not present

## 2020-10-20 DIAGNOSIS — K5909 Other constipation: Secondary | ICD-10-CM | POA: Diagnosis not present

## 2020-10-20 DIAGNOSIS — I4891 Unspecified atrial fibrillation: Secondary | ICD-10-CM | POA: Diagnosis not present

## 2020-10-20 DIAGNOSIS — K922 Gastrointestinal hemorrhage, unspecified: Secondary | ICD-10-CM | POA: Diagnosis not present

## 2020-10-20 DIAGNOSIS — D500 Iron deficiency anemia secondary to blood loss (chronic): Secondary | ICD-10-CM | POA: Diagnosis not present

## 2020-10-20 DIAGNOSIS — K921 Melena: Secondary | ICD-10-CM | POA: Diagnosis not present

## 2020-10-20 DIAGNOSIS — D62 Acute posthemorrhagic anemia: Secondary | ICD-10-CM | POA: Diagnosis not present

## 2020-10-20 DIAGNOSIS — Z20822 Contact with and (suspected) exposure to covid-19: Secondary | ICD-10-CM | POA: Diagnosis not present

## 2020-10-20 HISTORY — PX: COLONOSCOPY WITH PROPOFOL: SHX5780

## 2020-10-20 HISTORY — PX: ESOPHAGOGASTRODUODENOSCOPY (EGD) WITH PROPOFOL: SHX5813

## 2020-10-20 LAB — TYPE AND SCREEN
ABO/RH(D): A POS
Antibody Screen: NEGATIVE
Unit division: 0

## 2020-10-20 LAB — BASIC METABOLIC PANEL
Anion gap: 7 (ref 5–15)
BUN: 7 mg/dL — ABNORMAL LOW (ref 8–23)
CO2: 26 mmol/L (ref 22–32)
Calcium: 8.7 mg/dL — ABNORMAL LOW (ref 8.9–10.3)
Chloride: 108 mmol/L (ref 98–111)
Creatinine, Ser: 0.64 mg/dL (ref 0.44–1.00)
GFR, Estimated: >60 mL/min (ref >60)
Glucose, Bld: 81 mg/dL (ref 70–99)
Potassium: 3.5 mmol/L (ref 3.5–5.1)
Sodium: 141 mmol/L (ref 135–145)

## 2020-10-20 LAB — CBC
HCT: 29 % — ABNORMAL LOW (ref 36.0–46.0)
Hemoglobin: 8.9 g/dL — ABNORMAL LOW (ref 12.0–15.0)
MCH: 29.5 pg (ref 26.0–34.0)
MCHC: 30.7 g/dL (ref 30.0–36.0)
MCV: 96 fL (ref 80.0–100.0)
Platelets: 313 10*3/uL (ref 150–400)
RBC: 3.02 MIL/uL — ABNORMAL LOW (ref 3.87–5.11)
RDW: 18 % — ABNORMAL HIGH (ref 11.5–15.5)
WBC: 9.6 10*3/uL (ref 4.0–10.5)
nRBC: 0.7 % — ABNORMAL HIGH (ref 0.0–0.2)

## 2020-10-20 LAB — MAGNESIUM: Magnesium: 1.7 mg/dL (ref 1.7–2.4)

## 2020-10-20 LAB — HEMOGLOBIN AND HEMATOCRIT, BLOOD
HCT: 34.3 % — ABNORMAL LOW (ref 36.0–46.0)
Hemoglobin: 10.7 g/dL — ABNORMAL LOW (ref 12.0–15.0)

## 2020-10-20 LAB — GLUCOSE, CAPILLARY
Glucose-Capillary: 76 mg/dL (ref 70–99)
Glucose-Capillary: 82 mg/dL (ref 70–99)
Glucose-Capillary: 71 mg/dL (ref 70–99)
Glucose-Capillary: 84 mg/dL (ref 70–99)
Glucose-Capillary: 109 mg/dL — ABNORMAL HIGH (ref 70–99)
Glucose-Capillary: 102 mg/dL — ABNORMAL HIGH (ref 70–99)

## 2020-10-20 LAB — BPAM RBC
Blood Product Expiration Date: 202208122359
ISSUE DATE / TIME: 202208050947
Unit Type and Rh: 9500

## 2020-10-20 LAB — SURGICAL PCR SCREEN
MRSA, PCR: NEGATIVE
Staphylococcus aureus: NEGATIVE

## 2020-10-20 SURGERY — ESOPHAGOGASTRODUODENOSCOPY (EGD) WITH PROPOFOL
Anesthesia: General

## 2020-10-20 MED ORDER — MAGNESIUM SULFATE 2 GM/50ML IV SOLN
2.0000 g | Freq: Once | INTRAVENOUS | Status: AC
  Administered 2020-10-20: 2 g via INTRAVENOUS
  Filled 2020-10-20: qty 50

## 2020-10-20 MED ORDER — DEXTROSE 50 % IV SOLN
25.0000 mL | Freq: Once | INTRAVENOUS | Status: AC
  Administered 2020-10-20: 25 mL via INTRAVENOUS

## 2020-10-20 MED ORDER — ONDANSETRON HCL 4 MG/2ML IJ SOLN
INTRAMUSCULAR | Status: DC | PRN
  Administered 2020-10-20: 4 mg via INTRAVENOUS

## 2020-10-20 MED ORDER — FENTANYL CITRATE (PF) 100 MCG/2ML IJ SOLN
INTRAMUSCULAR | Status: AC
  Filled 2020-10-20: qty 2

## 2020-10-20 MED ORDER — PROPOFOL 500 MG/50ML IV EMUL
INTRAVENOUS | Status: DC | PRN
  Administered 2020-10-20: 200 ug/kg/min via INTRAVENOUS

## 2020-10-20 MED ORDER — LACTATED RINGERS IV SOLN: INTRAVENOUS | Status: DC | PRN

## 2020-10-20 MED ORDER — PROPOFOL 10 MG/ML IV BOLUS
INTRAVENOUS | Status: DC | PRN
  Administered 2020-10-20: 50 mg via INTRAVENOUS
  Administered 2020-10-20 (×4): 20 mg via INTRAVENOUS

## 2020-10-20 MED ORDER — LIDOCAINE HCL (PF) 2 % IJ SOLN
INTRAMUSCULAR | Status: AC
  Filled 2020-10-20: qty 5

## 2020-10-20 MED ORDER — DEXTROSE-NACL 5-0.9 % IV SOLN: INTRAVENOUS | Status: DC

## 2020-10-20 MED ORDER — PROPOFOL 10 MG/ML IV BOLUS
INTRAVENOUS | Status: AC
  Filled 2020-10-20: qty 40

## 2020-10-20 MED ORDER — DEXTROSE 50 % IV SOLN
INTRAVENOUS | Status: AC
  Filled 2020-10-20: qty 50

## 2020-10-20 MED ORDER — CHLORHEXIDINE GLUCONATE CLOTH 2 % EX PADS
6.0000 | MEDICATED_PAD | Freq: Every day | CUTANEOUS | Status: DC
  Administered 2020-10-20: 6 via TOPICAL

## 2020-10-20 MED ORDER — ONDANSETRON HCL 4 MG/2ML IJ SOLN
INTRAMUSCULAR | Status: AC
  Filled 2020-10-20: qty 2

## 2020-10-20 MED ORDER — STERILE WATER FOR IRRIGATION IR SOLN: Status: DC | PRN

## 2020-10-20 MED ORDER — APIXABAN 5 MG PO TABS
5.0000 mg | ORAL_TABLET | Freq: Two times a day (BID) | ORAL | Status: DC
  Administered 2020-10-20 – 2020-10-21 (×2): 5 mg via ORAL
  Filled 2020-10-20 (×2): qty 1

## 2020-10-20 MED ORDER — PHENYLEPHRINE 40 MCG/ML (10ML) SYRINGE FOR IV PUSH (FOR BLOOD PRESSURE SUPPORT)
PREFILLED_SYRINGE | INTRAVENOUS | Status: DC | PRN
  Administered 2020-10-20 (×2): 80 ug via INTRAVENOUS

## 2020-10-20 MED ORDER — FENTANYL CITRATE (PF) 100 MCG/2ML IJ SOLN
INTRAMUSCULAR | Status: DC | PRN
  Administered 2020-10-20 (×3): 25 ug via INTRAVENOUS

## 2020-10-20 NOTE — Transfer of Care (Signed)
Immediate Anesthesia Transfer of Care Note  Patient: [name]  Procedure(s) Performed: ESOPHAGOGASTRODUODENOSCOPY (EGD) WITH PROPOFOL COLONOSCOPY WITH PROPOFOL  Patient Location: PACU  Anesthesia Type:General  Level of Consciousness: awake, alert , oriented and sedated  Airway & Oxygen Therapy: Patient Spontanous Breathing and Patient connected to nasal cannula oxygen  Post-op Assessment: Post -op Vital signs reviewed and stable  Post vital signs: Reviewed and stable  Last Vitals:  Vitals Value Taken Time  BP 155/66 10/20/20 1055  Temp 36.6 C 10/20/20 1055  Pulse 66 10/20/20 1107  Resp 10 10/20/20 1107  SpO2 100 % 10/20/20 1107  Vitals shown include unvalidated device data.  Last Pain:  Vitals:   10/20/20 1055  TempSrc:   PainSc: 0-No pain      Patients Stated Pain Goal: 6 (10/20/20 1010)  Complications: No notable events documented.

## 2020-10-20 NOTE — Op Note (Signed)
[facility] Patient Name: Rachel Fox Procedure Date: 10/20/2020 9:24 AM MRN: [id number] Date of Birth: 26-Jul-1945 Attending MD: Lionel December , MD CSN: [id number] Age: 75 Admit Type: Inpatient Procedure:                Upper GI endoscopy Indications:              Heme positive stool and profound anemia requiring                            transfusion. Providers:                Lionel December, MD, Nena Polio, RN, Cyril Mourning, Technician Referring MD:             Standley Dakins, MD Medicines:                Propofol per Anesthesia Complications:            No immediate complications. Estimated Blood Loss:     Estimated blood loss: none. Procedure:                Pre-Anesthesia Assessment:                           - Prior to the procedure, a History and Physical                            was performed, and patient medications and                            allergies were reviewed. The patient's tolerance of                            previous anesthesia was also reviewed. The risks                            and benefits of the procedure and the sedation                            options and risks were discussed with the patient.                            All questions were answered, and informed consent                            was obtained. Prior Anticoagulants: The patient                            last took Eliquis (apixaban) 2 days prior to the                            procedure. ASA Grade Assessment: III - A patient  with severe systemic disease. After reviewing the                            risks and benefits, the patient was deemed in                            satisfactory condition to undergo the procedure.                           After obtaining informed consent, the endoscope was                            passed under direct vision. Throughout the                            procedure, the  patient's blood pressure, pulse, and                            oxygen saturations were monitored continuously. The                            [id number]-[id number] ([id number]) scope was introduced through the                            mouth, and advanced to the second part of duodenum.                            The upper GI endoscopy was accomplished without                            difficulty. The patient tolerated the procedure                            well. Scope In: 10:18:43 AM Scope Out: 10:21:42 AM Total Procedure Duration: 0 hours 2 minutes 59 seconds  Findings:      The hypopharynx was normal.      The Z-line was regular and was found 38 cm from the incisors.      The entire examined stomach was normal.      The duodenal bulb and second portion of the duodenum were normal. Impression:               - Normal hypopharynx.                           - Z-line regular, 38 cm from the incisors.                           - Normal stomach.                           - Normal duodenal bulb and second portion of the                            duodenum.                           -  No specimens collected. Moderate Sedation:      Per Anesthesia Care Recommendation:           - See the other procedure note for documentation of                            additional recommendations. Procedure Code(s):        --- Professional ---                           [phone], Esophagogastroduodenoscopy, flexible,                            transoral; diagnostic, including collection of                            specimen(s) by brushing or washing, when performed                            (separate procedure) Diagnosis Code(s):        --- Professional ---                           K22.8, Other specified diseases of esophagus                           R19.5, Other fecal abnormalities CPT copyright 2019 [employer]. All rights reserved. The codes documented in this report are preliminary and upon  coder review may  be revised to meet current compliance requirements. Lionel December, MD Lionel December, MD 10/20/2020 11:17:14 AM This report has been signed electronically. Number of Addenda: 0

## 2020-10-20 NOTE — Progress Notes (Signed)
Brief EGD and colonoscopy procedure note   EGD. Normal mucosa of the esophagus. Normal GE junction at 38 cm from the incisors. Normal examination of the stomach. Normal examination of first and second part of the duodenum.  Colonoscopy  Normal terminal ileum. Normal mucosa of colon and rectum. Normal anorectal junction.

## 2020-10-20 NOTE — Progress Notes (Signed)
PROGRESS NOTE   Rachel Fox  JWJ:[id number] DOB: Nov 11, 1945 DOA: 10/19/2020 PCP: Benita Stabile, MD   Chief Complaint  Patient presents with   low Hbg   Level of care: Telemetry  Brief Admission History:   75 y.o. 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  Her Hg baseline had been 13 and after having labs retested today her hemoglobin is down to 6.8.  She tested Hemoccult positive in the stool.  GI was consulted and recommended EGD. 1 unit of PRBC was recommended.   She was started on IV protonix and admission was requested for further management.  Assessment & Plan:   Principal Problem:   GI hemorrhage Active Problems:   Cerebrovascular accident (CVA) with involvement of left side of body (HCC)   Hypertension   Type 2 diabetes mellitus (HCC)   Hyperlipidemia   Dysphagia, post-stroke   Atrial fibrillation (HCC)   Hypertension associated with type 2 diabetes mellitus (HCC)   Hyperlipidemia associated with type 2 diabetes mellitus (HCC)   Chronic constipation   Seizure disorder (HCC)   Chronic blood loss anemia  Acute blood loss anemia - EGD / Colon was unrevealing.  Discussed with Dr. Karilyn Cota ok to restart apixaban.  Pt will need outpatient follow up with GI in 1 month and weekly Hg testing.    Type 2 DM - diet controlled.   Chronic atrial fibrillation-resume home rate control medications and resume apixaban.    Epilepsy-resume home antiepileptic therapy.  Seizure precautions.   Chronic blood loss anemia-Hg stable at 8.9.  Weekly H/H testing x 4 and follow up with GI in 1 month.    Hyperlipidemia-resume home  statin therapy given recent CVA.   Essential hypertension-resume home blood pressure lowering medications.   Chronic constipation-resume home daily laxative therapy.     Hypokalemia - repleted, follow and replete magnesium.     Hypomagnesemia - IV replacement ordered.   2 weeks postop s/p left hip hemi - Seen by Dr. Dallas Schimke and he is planning dressing change, staple removal and postop imaging.   DVT prophylaxis: apixban/SCDs Code Status: Full  Family Communication:  Disposition: anticipate return to SNF tomorrow if possible  Status is: Inpatient   Remains inpatient appropriate because:Inpatient level of care appropriate due to severity of illness Dispo: The patient is from: SNF              Anticipated d/c is to: SNF              Patient currently is medically stable to d/c.  Difficult to place patient No  Consultants:  GI Dr. Karilyn Cota   Procedures:  EGD/colonoscopy 10/20/20 EGD. Normal mucosa of the esophagus. Normal GE junction at 38 cm from the incisors. Normal examination of the stomach. Normal examination of first and second part of the duodenum.   Colonoscopy   Normal terminal ileum. Normal mucosa of colon and rectum. Normal anorectal junction.  Antimicrobials:  N/a   Subjective: Pt was seen just prior to EGD/colon today.  She had no complaints.     Objective: Vitals:   10/20/20 1055 10/20/20 1100 10/20/20 1115 10/20/20 1142  BP: (!) 155/66 (!) 152/71 (!) 159/73 (!) 152/70  Pulse:  67  66 62  Resp: 16  12 15   Temp: 97.8 F (36.6 C)   98.6 F (37 C)  TempSrc:    Oral  SpO2: 100%  99% 100%  Weight:      Height:        Intake/Output Summary (Last 24 hours) at 10/20/2020 1151 Last data filed at 10/20/2020 1020 Gross per 24 hour  Intake 959.41 ml  Output 900 ml  Net 59.41 ml   Filed Weights   10/19/20 0542  Weight: 55.8 kg    Examination:  General exam: Appears calm and comfortable  Respiratory system: Clear to auscultation. Respiratory effort  normal. Cardiovascular system: normal S1 & S2 heard. No JVD, murmurs, rubs, gallops or clicks. No pedal edema. Gastrointestinal system: Abdomen is nondistended, soft and nontender. No organomegaly or masses felt. Normal bowel sounds heard. Central nervous system: Alert and oriented. No focal neurological deficits. Extremities: Symmetric 5 x 5 power. Skin: No rashes, lesions or ulcers Psychiatry: Judgement and insight appear normal. Mood & affect appropriate.   Data Reviewed: I have personally reviewed following labs and imaging studies  CBC: Recent Labs  Lab 10/15/20 1041 10/16/20 1059 10/18/20 0700 10/19/20 0710 10/19/20 1519 10/20/20 0703  WBC 8.5  --   --  8.3  --  9.6  HGB 7.1* 7.6* 6.4* 6.8* 8.4* 8.9*  HCT 22.7* 24.9* 21.4* 23.5* 26.7* 29.0*  MCV 93.4  --   --  100.4*  --  96.0  PLT 303  --   --  288  --  313    Basic Metabolic Panel: Recent Labs  Lab 10/19/20 0600 10/20/20 0703  NA 141 141  K 3.2* 3.5  CL 106 108  CO2 28 26  GLUCOSE 102* 81  BUN 14 7*  CREATININE 0.78 0.64  CALCIUM 9.2 8.7*  MG 1.9 1.7    GFR: Estimated Creatinine Clearance: 51.8 mL/min (by C-G formula based on SCr of 0.64 mg/dL).  Liver Function Tests: Recent Labs  Lab 10/19/20 0600  AST 37  ALT 24  ALKPHOS 62  BILITOT 0.8  PROT 6.9  ALBUMIN 2.9*    CBG: Recent Labs  Lab 10/19/20 2306 10/20/20 0341 10/20/20 0522 10/20/20 0752 10/20/20 1028  GLUCAP 104* 76 82 71 84    Recent Results (from the past 240 hour(s))  Resp Panel by RT-PCR (Flu A&B, Covid) Nasopharyngeal Swab     Status: None   Collection Time: 10/19/20  6:47 PM   Specimen: Nasopharyngeal Swab; Nasopharyngeal(NP) swabs in vial transport medium  Result Value Ref Range Status   SARS Coronavirus 2 by RT PCR NEGATIVE NEGATIVE Final    Comment: (NOTE) SARS-CoV-2 target nucleic acids are NOT DETECTED.  The SARS-CoV-2 RNA is generally detectable in upper respiratory specimens during the acute phase of infection.  The lowest concentration of SARS-CoV-2 viral copies this assay can detect is 138 copies/mL. A negative result does not preclude SARS-Cov-2 infection and should not be used as the sole basis for treatment or other patient management decisions. A negative result may occur with  improper specimen collection/handling, submission of specimen other than nasopharyngeal swab, presence of viral mutation(s) within the areas targeted by this assay, and inadequate number of viral copies(<138 copies/mL). A negative result must be combined with clinical observations, patient history, and epidemiological information. The expected result is Negative.  Fact Sheet for Patients:  12/19/20  Fact Sheet for Healthcare Providers:  BloggerCourse.com  This test is no t yet approved or cleared by the SeriousBroker.it  States FDA and  has been authorized for detection and/or diagnosis of SARS-CoV-2 by FDA under an Emergency Use Authorization (EUA). This EUA will remain  in effect (meaning this test can be used) for the duration of the COVID-19 declaration under Section 564(b)(1) of the Act, 21 U.S.C.section 360bbb-3(b)(1), unless the authorization is terminated  or revoked sooner.       Influenza A by PCR NEGATIVE NEGATIVE Final   Influenza B by PCR NEGATIVE NEGATIVE Final    Comment: (NOTE) The Xpert Xpress SARS-CoV-2/FLU/RSV plus assay is intended as an aid in the diagnosis of influenza from Nasopharyngeal swab specimens and should not be used as a sole basis for treatment. Nasal washings and aspirates are unacceptable for Xpert Xpress SARS-CoV-2/FLU/RSV testing.  Fact Sheet for Patients: https://www.fda.gov/media/152166/download  Fact Sheet for Healthcare Providers: SeriousBroker.ithttps://www.fda.gov/media/152162/download  This test is not yet apBloggerCourse.comproved or cleared by the [place] FDA and has been authorized for detection and/or diagnosis of SARS-CoV-2 by FDA  under an Emergency Use Authorization (EUA). This EUA will remain in effect (meaning this test can be used) for the duration of the COVID-19 declaration under Section 564(b)(1) of the Act, 21 U.S.C. section 360bbb-3(b)(1), unless the authorization is terminated or revoked.  Performed at [facility], [address] [place], [place] [address]   Surgical pcr screen     Status: None   Collection Time: 10/20/20  5:11 AM   Specimen: Nasal Mucosa; Nasal Swab  Result Value Ref Range Status   MRSA, PCR NEGATIVE NEGATIVE Final   Staphylococcus aureus NEGATIVE NEGATIVE Final    Comment: (NOTE) The Xpert SA Assay (FDA approved for NASAL specimens in patients 73 years of age and older), is one component of a comprehensive surveillance program. It is not intended to diagnose infection nor to guide or monitor treatment. Performed at [facility], [address] [place], [place] [address]      Radiology Studies: CT CHEST W CONTRAST  Result Date: 10/19/2020 CLINICAL DATA:  Abdominal bruising to the right hip, fall EXAM: CT CHEST, ABDOMEN, AND PELVIS WITH CONTRAST TECHNIQUE: Multidetector CT imaging of the chest, abdomen and pelvis was performed following the standard protocol during bolus administration of intravenous contrast. CONTRAST:  75mL OMNIPAQUE IOHEXOL 300 MG/ML  SOLN COMPARISON:  CT pelvis 10/09/2020 FINDINGS: CT CHEST FINDINGS Lines/tubes: There is a left chest wall cardiac device in place with leads terminating in the right atrium and right ventricle. Cardiovascular: The heart is at the upper limits of normal for size. There is no pericardial effusion. There is scattered calcified atherosclerotic plaque throughout the aortic arch and thoracic aorta. Mediastinum/Nodes: There is no mediastinal, hilar, or axillary lymphadenopathy. Lungs/Pleura: The trachea and central airways are unremarkable. The lungs are well inflated. There is no focal consolidation or pulmonary edema. There is no evidence of  traumatic injury to the lungs. There is mild dependent subsegmental atelectasis in the lung bases. There is a 5 mm nodule in the medial right apex (4-39). There is a 9 mm ground-glass nodule in the left lung apex (4-54). There is a small calcified granuloma in the right upper lobe posteriorly. Musculoskeletal: There is no evidence of fracture or dislocation in the thorax. There is no suspicious osseous lesion. CT ABDOMEN PELVIS FINDINGS Hepatobiliary: There are scattered tiny hypodense lesions throughout the liver, too small to characterize. The gallbladder is unremarkable. There is mild intrahepatic biliary ductal dilatation. Pancreas: Normal. Spleen: Normal. Adrenals/Urinary Tract: The adrenals are normal. There is a 1.3 cm hypodense lesion in the  left kidney upper pole, indeterminate by Hounsfield units but likely a cyst or minimally complex cyst. A 0.8 cm lesion in the left kidney lower pole is too small to characterize. There is no hydronephrosis or hydroureter. The bladder is unremarkable Stomach/Bowel: The stomach is unremarkable. There is no evidence of bowel obstruction. There are scattered colonic diverticuli without evidence of acute diverticulitis. Vascular/Lymphatic: There is scattered calcified atherosclerotic plaque throughout the nonaneurysmal abdominal aorta. The main portal and splenic veins are patent. There is no abdominal or pelvic lymphadenopathy. Reproductive: Status post hysterectomy. No adnexal masses. Other: There is a small amount of presacral free fluid, also present on the prior CT pelvis of 10/09/2020 but slightly increased in volume. There is no organized or drainable fluid collection. There is no free intraperitoneal air. Musculoskeletal: The patient is status post left hip arthroplasty for treatment of a recent proximal femoral fracture. Hardware alignment appears within expected limits, though the inferior extent is incompletely imaged. There is no evidence of new perihardware  fracture. Edema and soft tissue gas about the left hip is likely related to the recent procedure. There is mild degenerative change in the lower lumbar spine. There is no evidence of vertebral fracture. There is skin thickening over the left buttock, unchanged. IMPRESSION: 1. Small amount of free presacral fluid, slightly increased in volume since 10/09/2020, nonspecific but of uncertain etiology. No evidence of traumatic injury to the abdominal or pelvic viscera. 2. Postsurgical changes reflecting left arthroplasty for treatment of a recent proximal femoral fracture without evidence of complication or new fracture. 3. Mild intrahepatic biliary ductal dilatation; no obstructing lesion seen. Correlate with LFTs. 4. Diverticulosis without evidence of acute diverticulitis. 5. Skin thickening over the left buttock is unchanged; correlate with physical exam. 6. 9 mm ground-glass nodule in the left lung apex. Initial follow-up with CT at 6-12 months is recommended to confirm persistence. If persistent, repeat CT is recommended every 2 years until 5 years of stability has been established. This recommendation follows the consensus statement: Guidelines for Management of Incidental Pulmonary Nodules Detected on CT Images: From the Fleischner Society 2017; Radiology 2017; 284:228-243. 7.  Aortic Atherosclerosis (ICD10-I70.0). Electronically Signed   By: Lesia Hausen MD   On: 10/19/2020 08:36   CT ABDOMEN PELVIS W CONTRAST  Result Date: 10/19/2020 CLINICAL DATA:  Abdominal bruising to the right hip, fall EXAM: CT CHEST, ABDOMEN, AND PELVIS WITH CONTRAST TECHNIQUE: Multidetector CT imaging of the chest, abdomen and pelvis was performed following the standard protocol during bolus administration of intravenous contrast. CONTRAST:  73mL OMNIPAQUE IOHEXOL 300 MG/ML  SOLN COMPARISON:  CT pelvis 10/09/2020 FINDINGS: CT CHEST FINDINGS Lines/tubes: There is a left chest wall cardiac device in place with leads terminating in the  right atrium and right ventricle. Cardiovascular: The heart is at the upper limits of normal for size. There is no pericardial effusion. There is scattered calcified atherosclerotic plaque throughout the aortic arch and thoracic aorta. Mediastinum/Nodes: There is no mediastinal, hilar, or axillary lymphadenopathy. Lungs/Pleura: The trachea and central airways are unremarkable. The lungs are well inflated. There is no focal consolidation or pulmonary edema. There is no evidence of traumatic injury to the lungs. There is mild dependent subsegmental atelectasis in the lung bases. There is a 5 mm nodule in the medial right apex (4-39). There is a 9 mm ground-glass nodule in the left lung apex (4-54). There is a small calcified granuloma in the right upper lobe posteriorly. Musculoskeletal: There is no evidence of fracture or dislocation  in the thorax. There is no suspicious osseous lesion. CT ABDOMEN PELVIS FINDINGS Hepatobiliary: There are scattered tiny hypodense lesions throughout the liver, too small to characterize. The gallbladder is unremarkable. There is mild intrahepatic biliary ductal dilatation. Pancreas: Normal. Spleen: Normal. Adrenals/Urinary Tract: The adrenals are normal. There is a 1.3 cm hypodense lesion in the left kidney upper pole, indeterminate by Hounsfield units but likely a cyst or minimally complex cyst. A 0.8 cm lesion in the left kidney lower pole is too small to characterize. There is no hydronephrosis or hydroureter. The bladder is unremarkable Stomach/Bowel: The stomach is unremarkable. There is no evidence of bowel obstruction. There are scattered colonic diverticuli without evidence of acute diverticulitis. Vascular/Lymphatic: There is scattered calcified atherosclerotic plaque throughout the nonaneurysmal abdominal aorta. The main portal and splenic veins are patent. There is no abdominal or pelvic lymphadenopathy. Reproductive: Status post hysterectomy. No adnexal masses. Other: There  is a small amount of presacral free fluid, also present on the prior CT pelvis of 10/09/2020 but slightly increased in volume. There is no organized or drainable fluid collection. There is no free intraperitoneal air. Musculoskeletal: The patient is status post left hip arthroplasty for treatment of a recent proximal femoral fracture. Hardware alignment appears within expected limits, though the inferior extent is incompletely imaged. There is no evidence of new perihardware fracture. Edema and soft tissue gas about the left hip is likely related to the recent procedure. There is mild degenerative change in the lower lumbar spine. There is no evidence of vertebral fracture. There is skin thickening over the left buttock, unchanged. IMPRESSION: 1. Small amount of free presacral fluid, slightly increased in volume since 10/09/2020, nonspecific but of uncertain etiology. No evidence of traumatic injury to the abdominal or pelvic viscera. 2. Postsurgical changes reflecting left arthroplasty for treatment of a recent proximal femoral fracture without evidence of complication or new fracture. 3. Mild intrahepatic biliary ductal dilatation; no obstructing lesion seen. Correlate with LFTs. 4. Diverticulosis without evidence of acute diverticulitis. 5. Skin thickening over the left buttock is unchanged; correlate with physical exam. 6. 9 mm ground-glass nodule in the left lung apex. Initial follow-up with CT at 6-12 months is recommended to confirm persistence. If persistent, repeat CT is recommended every 2 years until 5 years of stability has been established. This recommendation follows the consensus statement: Guidelines for Management of Incidental Pulmonary Nodules Detected on CT Images: From the Fleischner Society 2017; Radiology 2017; 284:228-243. 7.  Aortic Atherosclerosis (ICD10-I70.0). Electronically Signed   By: Lesia Hausen MD   On: 10/19/2020 08:36    Scheduled Meds:  sodium chloride   Intravenous Once    sodium chloride   Intravenous Once   amiodarone  100 mg Oral Daily   apixaban  5 mg Oral BID   atorvastatin  80 mg Oral q1800   Chlorhexidine Gluconate Cloth  6 each Topical Q0600   ferrous sulfate  325 mg Oral Q breakfast   insulin aspart  0-9 Units Subcutaneous TID WC   levETIRAcetam  500 mg Oral BID   metoprolol tartrate  12.5 mg Oral BID   pantoprazole (PROTONIX) IV  40 mg Intravenous Q12H   Continuous Infusions:  dextrose 5 % and 0.9% NaCl 50 mL/hr at 10/20/20 5035   magnesium sulfate bolus IVPB       LOS: 1 day   Time spent: 35 mins   Ovila Lepage Laural Benes, MD How to contact the [facility] Attending or Consulting provider 7A - 7P or covering provider during  after hours 7P -7A, for this patient?  Check the care team in [facility] and look for a) attending/consulting [facility] provider listed and b) the [facility] team listed Log into [email or web address] and use [facility]'s universal password to access. If you do not have the password, please contact the hospital operator. Locate the [facility] provider you are looking for under [facility] and page to a number that you can be directly reached. If you still have difficulty reaching the provider, please page the [facility] (Director on Call) for the Hospitalists listed on amion for assistance.  10/20/2020, 11:51 AM

## 2020-10-20 NOTE — Anesthesia Postprocedure Evaluation (Signed)
Anesthesia Post Note  Patient: LAKYA SCHRUPP  Procedure(s) Performed: ESOPHAGOGASTRODUODENOSCOPY (EGD) WITH PROPOFOL COLONOSCOPY WITH PROPOFOL  Patient location during evaluation: PACU Anesthesia Type: General Level of consciousness: awake and alert and oriented Pain management: pain level controlled Vital Signs Assessment: post-procedure vital signs reviewed and stable Respiratory status: spontaneous breathing and respiratory function stable Cardiovascular status: blood pressure returned to baseline and stable Postop Assessment: no apparent nausea or vomiting Anesthetic complications: no   No notable events documented.   Last Vitals:  Vitals:   10/20/20 1055 10/20/20 1100  BP: (!) 155/66 (!) 152/71  Pulse: 67   Resp: 16   Temp: 36.6 C   SpO2: 100%     Last Pain:  Vitals:   10/20/20 1111  TempSrc:   PainSc: 6                  Wilma Michaelson C Fraidy Mccarrick

## 2020-10-20 NOTE — Op Note (Signed)
[facility] Patient Name: Baylynn Shifflett Procedure Date: 10/20/2020 10:22 AM MRN: [id number] Date of Birth: 1945-06-06 Attending MD: Lionel December , MD CSN: [id number] Age: 75 Admit Type: Outpatient Procedure:                Colonoscopy Indications:              Heme positive stool Providers:                Lionel December, MD, Nena Polio, RN, Cyril Mourning, Technician Referring MD:             Standley Dakins, MD Medicines:                Propofol per Anesthesia Complications:            No immediate complications. Estimated Blood Loss:     Estimated blood loss: none. Procedure:                Pre-Anesthesia Assessment:                           - Prior to the procedure, a History and Physical                            was performed, and patient medications and                            allergies were reviewed. The patient's tolerance of                            previous anesthesia was also reviewed. The risks                            and benefits of the procedure and the sedation                            options and risks were discussed with the patient.                            All questions were answered, and informed consent                            was obtained. Prior Anticoagulants: The patient                            last took Eliquis (apixaban) 2 days prior to the                            procedure. ASA Grade Assessment: III - A patient                            with severe systemic disease. After reviewing the  risks and benefits, the patient was deemed in                            satisfactory condition to undergo the procedure.                           After obtaining informed consent, the colonoscope                            was passed under direct vision. Throughout the                            procedure, the patient's blood pressure, pulse, and                            oxygen  saturations were monitored continuously. The                            [id number]-HQ190L ([id number]) scope was introduced through                            the anus and advanced to the the terminal ileum,                            with identification of the appendiceal orifice and                            IC valve. The colonoscopy was performed without                            difficulty. The patient tolerated the procedure                            well. The quality of the bowel preparation was                            adequate. The terminal ileum, ileocecal valve,                            appendiceal orifice, and rectum were photographed. Scope In: 10:26:21 AM Scope Out: 10:45:33 AM Scope Withdrawal Time: 0 hours 10 minutes 56 seconds  Total Procedure Duration: 0 hours 19 minutes 12 seconds  Findings:      The perianal and digital rectal examinations were normal.      The terminal ileum appeared normal.      The colon (entire examined portion) appeared normal.      The retroflexed view of the distal rectum and anal verge was normal and       showed no anal or rectal abnormalities. Impression:               - The examined portion of the ileum was normal.                           - The entire examined colon is normal.                           -  No specimens collected. Moderate Sedation:      Per Anesthesia Care Recommendation:           - Return patient to hospital ward for ongoing care.                           - Diabetic (ADA) diet today.                           - Continue present medications.                           - Resume Eliquis (apixaban) at prior dose today.                           - Monitor for overt GI bleed in which case will                            need further evaluation.                           - Follow H&H weekly x4.                           - Office visit in 1 month. Procedure Code(s):        --- Professional ---                           [phone],  Colonoscopy, flexible; diagnostic, including                            collection of specimen(s) by brushing or washing,                            when performed (separate procedure) Diagnosis Code(s):        --- Professional ---                           R19.5, Other fecal abnormalities CPT copyright 2019 [employer]. All rights reserved. The codes documented in this report are preliminary and upon coder review may  be revised to meet current compliance requirements. Lionel December, MD Lionel December, MD 10/20/2020 11:23:05 AM This report has been signed electronically. Number of Addenda: 0

## 2020-10-20 NOTE — Progress Notes (Signed)
   ORTHOPAEDIC PROGRESS NOTE  s/p Procedure(s):  Left hip hemiarthroplasty   DOS: 10/10/20  SUBJECTIVE: Admitted due to low hemoglobin.  Plan for EGD today.  Concern for GI bleed.  She was discharged from the hospital to the [facility] following surgery.  She is 2 weeks postop.  She has been working with PT.  Continues to have some pain in her left hip.  Left hip dressing has been changed.  Staples remain in place.  Her hemoglobin has steadily dropped following surgery.  No bleeding from surgical incision.   OBJECTIVE: PE:  Alert and oriented, no acute distress  Left hip dressing in clean, dry and intact Little movement of great toe and ankle Sensation decreased globally to left foot Toes are warm and well perfused She is not using hip abduction pillow  Vitals:   10/20/20 0149 10/20/20 0432  BP: 139/62 (!) 143/62  Pulse: 63 63  Resp: 16 18  Temp: 98.3 F (36.8 C) 98.3 F (36.8 C)  SpO2: 100% 100%   CBC    Component Value Date/Time   WBC 8.3 10/19/2020 0710   RBC 2.37 (L) 10/19/2020 0827   RBC 2.34 (L) 10/19/2020 0710   HGB 8.4 (L) 10/19/2020 1519   HCT 26.7 (L) 10/19/2020 1519   HCT 44.1 09/21/2018 1120   PLT 288 10/19/2020 0710   MCV 100.4 (H) 10/19/2020 0710   MCH 29.1 10/19/2020 0710   MCHC 28.9 (L) 10/19/2020 0710   RDW 17.2 (H) 10/19/2020 0710   LYMPHSABS 1.5 10/09/2020 1133   MONOABS 0.7 10/09/2020 1133   EOSABS 0.2 10/09/2020 1133   BASOSABS 0.0 10/09/2020 1133     ASSESSMENT: Rachel Fox is a 75 y.o. female stable following left hip hemi; admit for GI bleed.  No breakdown of incision.  No continued postop bleeding.    PLAN: Weightbearing: WBAT LLE Insicional and dressing care: Reinforce dressings as needed; during admission, will obtain an XR of the left hip and remove staples.  She is scheduled for procedure today, so can get tomorrow Orthopedic device(s):  Hip abduction pillow while in bed VTE prophylaxis:  Discretion of medicine team;  currently not taking her Xarelto due to ongoing concern for a GI bleed.    Pain control: As needed Follow - up plan: 4 weeks; will get XR and remove staples during admission.  Can see her in clinic in approximately 1 month.    Contact information:     Merilyn Pagan A. Dallas Schimke, MD MS [facility] [address] [place],  [place]  [address] Phone: [phone] Fax: [phone]

## 2020-10-20 NOTE — Progress Notes (Signed)
Subjective:  Patient has no complaints.  She feels he needs to have a bowel movement.  She was able to drink GoLytely and has been passing clear stools according to nursing staff.  Patient denies chest pain shortness of breath or abdominal pain.  Current Medications:  Current Facility-Administered Medications:    0.9 %  sodium chloride infusion (Manually program via Guardrails IV Fluids), , Intravenous, Once, Hopper, William F, MD   0.9 %  sodium chloride infusion (Manually program via Guardrails IV Fluids), , Intravenous, Once, Hopper, William F, MD   acetaminophen (TYLENOL) tablet 650 mg, 650 mg, Oral, Q6H PRN **OR** acetaminophen (TYLENOL) suppository 650 mg, 650 mg, Rectal, Q6H PRN, Johnson, Clanford L, MD   amiodarone (PACERONE) tablet 100 mg, 100 mg, Oral, Daily, Johnson, Clanford L, MD, 100 mg at 10/20/20 0927   atorvastatin (LIPITOR) tablet 80 mg, 80 mg, Oral, q1800, Johnson, Clanford L, MD, 80 mg at 10/19/20 1930   Chlorhexidine Gluconate Cloth 2 % PADS 6 each, 6 each, Topical, Q0600, Johnson, Clanford L, MD, 6 each at 10/20/20 0519   dextrose 5 %-0.9 % sodium chloride infusion, , Intravenous, Continuous, Johnson, Clanford L, MD, Last Rate: 50 mL/hr at 10/20/20 0822, New Bag at 10/20/20 0822   fentaNYL (SUBLIMAZE) injection 12.5-25 mcg, 12.5-25 mcg, Intravenous, Q2H PRN, Johnson, Clanford L, MD, 12.5 mcg at 10/19/20 1842   ferrous sulfate tablet 325 mg, 325 mg, Oral, Q breakfast, Johnson, Clanford L, MD, 325 mg at 10/20/20 0927   insulin aspart (novoLOG) injection 0-9 Units, 0-9 Units, Subcutaneous, TID WC, Johnson, Clanford L, MD   levETIRAcetam (KEPPRA) tablet 500 mg, 500 mg, Oral, BID, Johnson, Clanford L, MD, 500 mg at 10/20/20 0927   metoprolol tartrate (LOPRESSOR) tablet 12.5 mg, 12.5 mg, Oral, BID, Johnson, Clanford L, MD, 12.5 mg at 10/20/20 0929   ondansetron (ZOFRAN) tablet 4 mg, 4 mg, Oral, Q6H PRN **OR** ondansetron (ZOFRAN) injection 4 mg, 4 mg, Intravenous, Q6H PRN,  Johnson, Clanford L, MD   pantoprazole (PROTONIX) injection 40 mg, 40 mg, Intravenous, Q12H, Johnson, Clanford L, MD, 40 mg at 10/20/20 0927   Objective: Blood pressure (!) 143/62, pulse 63, temperature 98.3 F (36.8 C), temperature source Oral, resp. rate 18, height 5' 3" (1.6 m), weight 55.8 kg, SpO2 100 %. Patient is alert and in no acute distress. Conjunctiva is pink. Sclera is nonicteric Oropharyngeal mucosa is normal. No neck masses or thyromegaly noted. Cardiac exam with regular rhythm normal S1 and S2. No murmur or gallop noted. Lungs are clear to auscultation. Abdomen is soft and nontender with organomegaly or masses. Left upper extremity with hypertonia in flexion and left elbow hand resting on her chest. Dressing over left hip surgical site appears to be dry.  Labs/studies Results:   CBC Latest Ref Rng & Units 10/20/2020 10/19/2020 10/19/2020  WBC 4.0 - 10.5 K/uL 9.6 - 8.3  Hemoglobin 12.0 - 15.0 g/dL 8.9(L) 8.4(L) 6.8(LL)  Hematocrit 36.0 - 46.0 % 29.0(L) 26.7(L) 23.5(L)  Platelets 150 - 400 K/uL 313 - 288    CMP Latest Ref Rng & Units 10/20/2020 10/19/2020 10/12/2020  Glucose 70 - 99 mg/dL 81 102(H) 108(H)  BUN 8 - 23 mg/dL 7(L) 14 24(H)  Creatinine 0.44 - 1.00 mg/dL 0.64 0.78 0.86  Sodium 135 - 145 mmol/L 141 141 136  Potassium 3.5 - 5.1 mmol/L 3.5 3.2(L) 3.9  Chloride 98 - 111 mmol/L 108 106 104  CO2 22 - 32 mmol/L 26 28 24  Calcium 8.9 - 10.3 mg/dL 8.7(L)   9.2 9.3  Total Protein 6.5 - 8.1 g/dL - 6.9 -  Total Bilirubin 0.3 - 1.2 mg/dL - 0.8 -  Alkaline Phos 38 - 126 U/L - 62 -  AST 15 - 41 U/L - 37 -  ALT 0 - 44 U/L - 24 -    Hepatic Function Latest Ref Rng & Units 10/19/2020 10/09/2020 05/17/2020  Total Protein 6.5 - 8.1 g/dL 6.9 7.7 7.5  Albumin 3.5 - 5.0 g/dL 2.9(L) 3.6 3.6  AST 15 - 41 U/L 37 31 29  ALT 0 - 44 U/L _0 Alk Phosphatase 38 - 126 U/L 62 59 61  Total Bilirubin 0.3 - 1.2 mg/dL 0.8 0.9 0.6      Assessment:  #1.  GI bleed.  She was noted to have  heme positive stool on admission.  No history of overt GI bleed.  She is anticoagulated for history of atrial fibrillation.  She is stable to proceed with diagnostic EGD and colonoscopy.  #2.  Anemia.  She had normal H&H prior to left hip surgery on 10/10/2020.  Her hemoglobin has been gradually dropping.  Some of the drop would appear to be related to hip fracture and surgery.  Anemia deemed to be multifactorial occluding occult GI bleed.  Patient has received 1 unit of PRBCs and hemoglobin is to 8.9 g.  #3.  Chronic atrial fibrillation with controlled ventricular rate.  Patient is on amiodarone.  Apixaban is on hold.  #4.  History of seizure disorder.  Patient is on  levetiracetam.   Plan:  Proceed with diagnostic esophagogastroduodenoscopy and colonoscopy.

## 2020-10-20 NOTE — Plan of Care (Signed)
Pt npo for EGD and colonoscopy, IV fluids running, last hgb 8.4 after 1 unit of blood, no s/s of infection, telemetry on pt atrial paced, remains free from injury with safety precautions in place.  Problem: Education: Goal: Knowledge of General Education information will improve Description: Including pain rating scale, medication(s)/side effects and non-pharmacologic comfort measures 10/20/2020 0212 by Maryella Shivers, RN Outcome: Progressing 10/19/2020 2247 by Maryella Shivers, RN Outcome: Progressing   Problem: Health Behavior/Discharge Planning: Goal: Ability to manage health-related needs will improve 10/20/2020 0212 by Maryella Shivers, RN Outcome: Progressing 10/19/2020 2247 by Maryella Shivers, RN Outcome: Progressing   Problem: Clinical Measurements: Goal: Ability to maintain clinical measurements within normal limits will improve 10/20/2020 0212 by Maryella Shivers, RN Outcome: Progressing 10/19/2020 2247 by Maryella Shivers, RN Outcome: Progressing Goal: Will remain free from infection 10/20/2020 0212 by Maryella Shivers, RN Outcome: Progressing 10/19/2020 2247 by Maryella Shivers, RN Outcome: Progressing Goal: Diagnostic test results will improve 10/20/2020 0212 by Maryella Shivers, RN Outcome: Progressing 10/19/2020 2247 by Darcella Cheshire D, RN Outcome: Progressing Goal: Respiratory complications will improve 10/20/2020 0212 by Maryella Shivers, RN Outcome: Progressing 10/19/2020 2247 by Darcella Cheshire D, RN Outcome: Progressing Goal: Cardiovascular complication will be avoided 10/20/2020 0212 by Maryella Shivers, RN Outcome: Progressing 10/19/2020 2247 by Maryella Shivers, RN Outcome: Progressing   Problem: Activity: Goal: Risk for activity intolerance will decrease 10/20/2020 0212 by Maryella Shivers, RN Outcome: Progressing 10/19/2020 2247 by Maryella Shivers, RN Outcome: Progressing   Problem: Nutrition: Goal: Adequate nutrition will be maintained 10/20/2020 0212  by Maryella Shivers, RN Outcome: Progressing 10/19/2020 2247 by Darcella Cheshire D, RN Outcome: Progressing   Problem: Coping: Goal: Level of anxiety will decrease 10/20/2020 0212 by Maryella Shivers, RN Outcome: Progressing 10/19/2020 2247 by Maryella Shivers, RN Outcome: Progressing   Problem: Elimination: Goal: Will not experience complications related to bowel motility 10/20/2020 0212 by Maryella Shivers, RN Outcome: Progressing 10/19/2020 2247 by Maryella Shivers, RN Outcome: Progressing Goal: Will not experience complications related to urinary retention 10/20/2020 0212 by Maryella Shivers, RN Outcome: Progressing 10/19/2020 2247 by Maryella Shivers, RN Outcome: Progressing   Problem: Pain Managment: Goal: General experience of comfort will improve 10/20/2020 0212 by Maryella Shivers, RN Outcome: Progressing 10/19/2020 2247 by Maryella Shivers, RN Outcome: Progressing   Problem: Safety: Goal: Ability to remain free from injury will improve 10/20/2020 0212 by Maryella Shivers, RN Outcome: Progressing 10/19/2020 2247 by Darcella Cheshire D, RN Outcome: Progressing   Problem: Skin Integrity: Goal: Risk for impaired skin integrity will decrease 10/20/2020 0212 by Maryella Shivers, RN Outcome: Progressing 10/19/2020 2247 by Maryella Shivers, RN Outcome: Progressing

## 2020-10-21 ENCOUNTER — Inpatient Hospital Stay
Admission: RE | Admit: 2020-10-21 | Discharge: 2021-04-16 | Disposition: A | Discharge status: Home / Self Care | Payer: Medicare Other | Source: Ambulatory Visit | Attending: Internal Medicine | Admitting: Internal Medicine

## 2020-10-21 ENCOUNTER — Inpatient Hospital Stay (HOSPITAL_COMMUNITY): Payer: Medicare Other

## 2020-10-21 DIAGNOSIS — I152 Hypertension secondary to endocrine disorders: Secondary | ICD-10-CM

## 2020-10-21 DIAGNOSIS — E7849 Other hyperlipidemia: Secondary | ICD-10-CM | POA: Diagnosis not present

## 2020-10-21 DIAGNOSIS — Z1231 Encounter for screening mammogram for malignant neoplasm of breast: Principal | ICD-10-CM

## 2020-10-21 DIAGNOSIS — K922 Gastrointestinal hemorrhage, unspecified: Secondary | ICD-10-CM | POA: Diagnosis not present

## 2020-10-21 DIAGNOSIS — M810 Age-related osteoporosis without current pathological fracture: Secondary | ICD-10-CM

## 2020-10-21 DIAGNOSIS — E1159 Type 2 diabetes mellitus with other circulatory complications: Secondary | ICD-10-CM

## 2020-10-21 DIAGNOSIS — K5909 Other constipation: Secondary | ICD-10-CM | POA: Diagnosis not present

## 2020-10-21 DIAGNOSIS — E1169 Type 2 diabetes mellitus with other specified complication: Secondary | ICD-10-CM

## 2020-10-21 DIAGNOSIS — I4891 Unspecified atrial fibrillation: Secondary | ICD-10-CM | POA: Diagnosis not present

## 2020-10-21 LAB — CBC
HCT: 33.2 % — ABNORMAL LOW (ref 36.0–46.0)
Hemoglobin: 10.2 g/dL — ABNORMAL LOW (ref 12.0–15.0)
MCH: 29.5 pg (ref 26.0–34.0)
MCHC: 30.7 g/dL (ref 30.0–36.0)
MCV: 96 fL (ref 80.0–100.0)
Platelets: 280 10*3/uL (ref 150–400)
RBC: 3.46 MIL/uL — ABNORMAL LOW (ref 3.87–5.11)
RDW: 17.8 % — ABNORMAL HIGH (ref 11.5–15.5)
WBC: 8 10*3/uL (ref 4.0–10.5)
nRBC: 0.4 % — ABNORMAL HIGH (ref 0.0–0.2)

## 2020-10-21 LAB — GLUCOSE, CAPILLARY
Glucose-Capillary: 75 mg/dL (ref 70–99)
Glucose-Capillary: 85 mg/dL (ref 70–99)

## 2020-10-21 LAB — MAGNESIUM: Magnesium: 2.2 mg/dL (ref 1.7–2.4)

## 2020-10-21 MED ORDER — OXYCODONE HCL 5 MG PO TABS: 5.0000 mg | ORAL_TABLET | Freq: Four times a day (QID) | ORAL | 0 refills | Status: AC | PRN

## 2020-10-21 MED ORDER — BISACODYL 10 MG RE SUPP
10.0000 mg | Freq: Once | RECTAL | Status: AC
  Administered 2020-10-21: 10 mg via RECTAL
  Filled 2020-10-21: qty 1

## 2020-10-21 NOTE — Progress Notes (Signed)
Subjective:  Patient denies nausea vomiting or abdominal pain.  She says she has good appetite.  She feels she has stool in her rectum and she is not able to push it out.  Current Medications:  Current Facility-Administered Medications:    0.9 %  sodium chloride infusion (Manually program via Guardrails IV Fluids), , Intravenous, Once, Hopper, William F, MD   0.9 %  sodium chloride infusion (Manually program via Guardrails IV Fluids), , Intravenous, Once, Hopper, William F, MD   acetaminophen (TYLENOL) tablet 650 mg, 650 mg, Oral, Q6H PRN **OR** acetaminophen (TYLENOL) suppository 650 mg, 650 mg, Rectal, Q6H PRN, Johnson, Clanford L, MD   amiodarone (PACERONE) tablet 100 mg, 100 mg, Oral, Daily, Johnson, Clanford L, MD, 100 mg at 10/21/20 0901   apixaban (ELIQUIS) tablet 5 mg, 5 mg, Oral, BID, Johnson, Clanford L, MD, 5 mg at 10/21/20 0526   atorvastatin (LIPITOR) tablet 80 mg, 80 mg, Oral, q1800, Johnson, Clanford L, MD, 80 mg at 10/20/20 1713   Chlorhexidine Gluconate Cloth 2 % PADS 6 each, 6 each, Topical, Q0600, Johnson, Clanford L, MD, 6 each at 10/20/20 0519   fentaNYL (SUBLIMAZE) injection 12.5-25 mcg, 12.5-25 mcg, Intravenous, Q2H PRN, Johnson, Clanford L, MD, 25 mcg at 10/21/20 1045   ferrous sulfate tablet 325 mg, 325 mg, Oral, Q breakfast, Johnson, Clanford L, MD, 325 mg at 10/21/20 0901   levETIRAcetam (KEPPRA) tablet 500 mg, 500 mg, Oral, BID, Johnson, Clanford L, MD, 500 mg at 10/21/20 0901   metoprolol tartrate (LOPRESSOR) tablet 12.5 mg, 12.5 mg, Oral, BID, Johnson, Clanford L, MD, 12.5 mg at 10/21/20 0900   ondansetron (ZOFRAN) tablet 4 mg, 4 mg, Oral, Q6H PRN **OR** ondansetron (ZOFRAN) injection 4 mg, 4 mg, Intravenous, Q6H PRN, Johnson, Clanford L, MD   pantoprazole (PROTONIX) injection 40 mg, 40 mg, Intravenous, Q12H, Johnson, Clanford L, MD, 40 mg at 10/21/20 0902   Objective: Blood pressure 128/64, pulse 66, temperature 98.6 F (37 C), temperature source Oral, resp.  rate 15, height 5' 3" (1.6 m), weight 55.8 kg, SpO2 100 %. Patient is alert and in no acute distress. Abdominal examination is within normal limits. Nursing staff has remove all the staples.  Some fullness noted there was a lower end of this scar.  Labs/studies Results:   CBC Latest Ref Rng & Units 10/21/2020 10/20/2020 10/20/2020  WBC 4.0 - 10.5 K/uL 8.0 - 9.6  Hemoglobin 12.0 - 15.0 g/dL 10.2(L) 10.7(L) 8.9(L)  Hematocrit 36.0 - 46.0 % 33.2(L) 34.3(L) 29.0(L)  Platelets 150 - 400 K/uL 280 - 313    CMP Latest Ref Rng & Units 10/20/2020 10/19/2020 10/12/2020  Glucose 70 - 99 mg/dL 81 102(H) 108(H)  BUN 8 - 23 mg/dL 7(L) 14 24(H)  Creatinine 0.44 - 1.00 mg/dL 0.64 0.78 0.86  Sodium 135 - 145 mmol/L 141 141 136  Potassium 3.5 - 5.1 mmol/L 3.5 3.2(L) 3.9  Chloride 98 - 111 mmol/L 108 106 104  CO2 22 - 32 mmol/L 26 28 24  Calcium 8.9 - 10.3 mg/dL 8.7(L) 9.2 9.3  Total Protein 6.5 - 8.1 g/dL - 6.9 -  Total Bilirubin 0.3 - 1.2 mg/dL - 0.8 -  Alkaline Phos 38 - 126 U/L - 62 -  AST 15 - 41 U/L - 37 -  ALT 0 - 44 U/L - 24 -    Hepatic Function Latest Ref Rng & Units 10/19/2020 10/09/2020 05/17/2020  Total Protein 6.5 - 8.1 g/dL 6.9 7.7 7.5  Albumin 3.5 - 5.0 g/dL 2.9(L)   3.6 3.6  AST 15 - 41 U/L 37 31 29  ALT 0 - 44 U/L _0 Alk Phosphatase 38 - 126 U/L 62 59 61  Total Bilirubin 0.3 - 1.2 mg/dL 0.8 0.9 0.6      Assessment:  #1.  Anemia.  Patient was noted to be heme positive on admission but no history of melena or rectal bleeding hematuria or vaginal bleeding.  She underwent EGD and colonoscopy and no lesion discovered to account for GI blood loss.  She has received 1 unit of PRBCs and her hemoglobin is now up to 10.2 g.  #2.  Heme positive stool.  EGD and colonoscopy were negative.  We will plan to see in the office in 1 month and determine if she should undergo small bowel given capsule study.  #3.  Chronic atrial fibrillation.  Patient is back on apixaban.  #4.  Patient is status post  left hip surgery 12 days ago.  Patient seen by Dr. Amedeo Kinsman yesterday.  He ordered hip films to be done today.  Studies been completed but not read yet.   Plan:  1 Dulcolax suppository before she is discharged. Will recommend CBC next week. Will arrange for office visit in 1 month. c

## 2020-10-21 NOTE — Progress Notes (Signed)
Nsg Discharge Note  Admit Date:  10/19/2020 Discharge date: 10/21/2020   [facility] be [facility]'d [facility]  Admit Date:  10/19/2020 Discharge date: 10/21/2020   Rachel Fox to be D/C'd Skilled nursing facility per MD order.  AVS completed.  Copy for chart, and copy for patient signed, and dated. Patient/caregiver able to verbalize understanding.  Discharge Medication: Allergies as of 10/21/2020       Reactions   Lactose Intolerance (gi) Other (See Comments)   Causes Gas in patient.        Medication List     STOP taking these medications    Botox 100 units Solr injection Generic drug: botulinum toxin Type A       TAKE these medications    acetaminophen 500 MG tablet Commonly known as: TYLENOL Take 1,000 mg by mouth every 8 (eight) hours as needed for moderate pain or headache.   amiodarone 100 MG tablet Commonly known as: PACERONE Take 1 tablet (100 mg total) by mouth daily.   apixaban 5 MG Tabs tablet Commonly known as: ELIQUIS Take 1 tablet (5 mg total) by mouth 2 (two) times daily.   atorvastatin 80 MG tablet Commonly known as: LIPITOR Take 1 tablet (80 mg total) by mouth daily at 6 PM.   bisacodyl 10 MG suppository Commonly known as: Dulcolax Place 1 suppository (10 mg total) rectally every Monday, Wednesday, and Friday.   Ensure Take 237 mLs by mouth daily at 12 noon.   ferrous sulfate 325 (65 FE) MG EC tablet Take 1 tablet (325 mg total) by mouth daily with breakfast.   levETIRAcetam 500 MG tablet Commonly known as: KEPPRA Take 500 mg by mouth 2 (two) times daily.   metoprolol tartrate 25 MG tablet Commonly known as: LOPRESSOR Take 0.5 tablets (12.5 mg total) by mouth 2 (two) times daily.   NON FORMULARY Diet: No added salt, ConCHO; lactose intolerant   oxyCODONE 5 MG immediate release tablet Commonly known as: Oxy IR/ROXICODONE Take 1 tablet (5 mg total) by mouth every 6 (six) hours as needed for up to 3 days for  severe pain. What changed: when to take this   senna-docusate 8.6-50 MG tablet Commonly known as: Senokot-S Take 2 tablets by mouth 2 (two) times daily.   Venelex Oint Apply 1 application topically as directed. Apply to sacrum and coccyx qshift for prevention. Every Shift; Day, Evening, Night        Discharge Assessment: Vitals:   10/20/20 2104 10/21/20 0525  BP: (!) 120/56 128/64  Pulse: 61 66  Resp:    Temp: 98.5 F (36.9 C) 98.6 F (37 C)  SpO2: 99% 100%   Skin clean, dry and intact without evidence of skin break down, no evidence of skin tears noted. IV catheter discontinued intact. Site without signs and symptoms of complications - no redness or edema noted at insertion site, patient denies c/o pain - only slight tenderness at site.  Dressing with slight pressure applied.  D/c Instructions-Education: Discharge instructions given to patient/family with verbalized understanding. D/c education completed with patient/family including follow up instructions, medication list, d/c activities limitations if indicated, with other d/c instructions as indicated by MD - patient able to verbalize understanding, all questions fully answered. Patient instructed to return to ED, call 911, or call MD for any changes in condition.  Patient escorted via WC, and D/C home via private auto.  Demetrio Lapping, RN 10/21/2020 2:33 PM  per MD order.  AVS completed.  Copy for chart, and copy for patient signed, and dated. Patient/caregiver able to verbalize understanding.  Discharge Medication: Allergies as of 10/21/2020       Reactions   Lactose Intolerance (gi) Other (See Comments)   Causes Gas in patient.        Medication List     STOP taking these medications    Botox 100 units Solr injection Generic drug: botulinum toxin Type A       TAKE these medications    acetaminophen 500 MG tablet Commonly known as: TYLENOL Take 1,000 mg by mouth every 8 (eight) hours as needed for  moderate pain or headache.   amiodarone 100 MG tablet Commonly known as: PACERONE Take 1 tablet (100 mg total) by mouth daily.   apixaban 5 MG Tabs tablet Commonly known as: ELIQUIS Take 1 tablet (5 mg total) by mouth 2 (two) times daily.   atorvastatin 80 MG tablet Commonly known as: LIPITOR Take 1 tablet (80 mg total) by mouth daily at 6 PM.   bisacodyl 10 MG suppository Commonly known as: Dulcolax Place 1 suppository (10 mg total) rectally every Monday, Wednesday, and Friday.   Ensure Take 237 mLs by mouth daily at 12 noon.   ferrous sulfate 325 (65 FE) MG EC tablet Take 1 tablet (325 mg total) by mouth daily with breakfast.   levETIRAcetam 500 MG tablet Commonly known as: KEPPRA Take 500 mg by mouth 2 (two) times daily.   metoprolol tartrate 25 MG tablet Commonly known as: LOPRESSOR Take 0.5 tablets (12.5 mg total) by mouth 2 (two) times daily.   NON FORMULARY Diet: No added salt, ConCHO; lactose intolerant   oxyCODONE 5 MG immediate release tablet Commonly known as: Oxy IR/ROXICODONE Take 1 tablet (5 mg total) by mouth every 6 (six) hours as needed for up to 3 days for severe pain. What changed: when to take this   senna-docusate 8.6-50 MG tablet Commonly known as: Senokot-S Take 2 tablets by mouth 2 (two) times daily.   Venelex Oint Apply 1 application topically as directed. Apply to sacrum and coccyx qshift for prevention. Every Shift; Day, Evening, Night        Discharge Assessment: Vitals:   10/20/20 2104 10/21/20 0525  BP: (!) 120/56 128/64  Pulse: 61 66  Resp:    Temp: 98.5 F (36.9 C) 98.6 F (37 C)  SpO2: 99% 100%   Skin clean, dry and intact without evidence of skin break down, no evidence of skin tears noted. IV catheter discontinued intact. Site without signs and symptoms of complications - no redness or edema noted at insertion site, patient denies c/o pain - only slight tenderness at site.  Dressing with slight pressure applied.  D/c  Instructions-Education: Discharge instructions given to patient/family with verbalized understanding. D/c education completed with patient/family including follow up instructions, medication list, d/c activities limitations if indicated, with other d/c instructions as indicated by MD - patient able to verbalize understanding, all questions fully answered. Patient instructed to return to ED, call 911, or call MD for any changes in condition.  Patient escorted via WC, and D/C home via private auto.  Demetrio Lapping, LPN 10/22/8674 7:20 PM

## 2020-10-21 NOTE — TOC Transition Note (Signed)
Transition of Care [facility]) - CM/SW Discharge Note   Patient Details  Name: [name] MRN: [id number] Date of Birth: [birth year]  [facility] CM/SW Contact:  Barry Brunner, LCSW Phone Number: 10/21/2020, 12:51 PM   Clinical Narrative:    CSW notified of patient's readiness for discharge. CSW contacted [facility] agreeable to take patient. Nurse to call report. TOC signing off.   Final next level of care: Skilled Nursing Facility Barriers to Discharge: Barriers Resolved   Patient Goals and CMS Choice Patient states their goals for this hospitalization and ongoing recovery are:: rehab with SNF CMS Medicare.gov Compare Post Acute Care list provided to:: Patient Choice offered to / list presented to : Patient  Discharge Placement                Patient to be transferred to facility by: [facility] Name of family member notified: [place], Frith (Son)   [phone] Patient and family notified of of transfer: 10/21/20  Discharge Plan and Services In-house Referral: Clinical Social Work                                   Social Determinants of Health (SDOH) Interventions     Readmission Risk Interventions No flowsheet data found.

## 2020-10-21 NOTE — Progress Notes (Addendum)
Pts staples removed  By this nurse and Rachael RN. 36 staples counted by this nurse and nurse tech.  No drainage reported. Site cleaned and steri strips placed Foam placed on top. Pt tolerated well.

## 2020-10-21 NOTE — Discharge Instructions (Addendum)
PLEASE FOLLOW UP WITH DR CAIRNS IN 4 WEEKS FOR POSTOP CHECK UP.    IMPORTANT INFORMATION: PAY CLOSE ATTENTION   PHYSICIAN DISCHARGE INSTRUCTIONS  Follow with Primary care provider  Benita Stabile, MD  and other consultants as instructed by your Hospitalist Physician  SEEK MEDICAL CARE OR RETURN TO EMERGENCY ROOM IF SYMPTOMS COME BACK, WORSEN OR NEW PROBLEM DEVELOPS   Please note: You were cared for by a hospitalist during your hospital stay. Every effort will be made to forward records to your primary care provider.  You can request that your primary care provider send for your hospital records if they have not received them.  Once you are discharged, your primary care physician will handle any further medical issues. Please note that NO REFILLS for any discharge medications will be authorized once you are discharged, as it is imperative that you return to your primary care physician (or establish a relationship with a primary care physician if you do not have one) for your post hospital discharge needs so that they can reassess your need for medications and monitor your lab values.  Please get a complete blood count and chemistry panel checked by your Primary MD at your next visit, and again as instructed by your Primary MD.  Get Medicines reviewed and adjusted: Please take all your medications with you for your next visit with your Primary MD  Laboratory/radiological data: Please request your Primary MD to go over all hospital tests and procedure/radiological results at the follow up, please ask your primary care provider to get all Hospital records sent to his/her office.  In some cases, they will be blood work, cultures and biopsy results pending at the time of your discharge. Please request that your primary care provider follow up on these results.  If you are diabetic, please bring your blood sugar readings with you to your follow up appointment with primary care.    Please call and  make your follow up appointments as soon as possible.    Also Note the following: If you experience worsening of your admission symptoms, develop shortness of breath, life threatening emergency, suicidal or homicidal thoughts you must seek medical attention immediately by calling 911 or calling your MD immediately  if symptoms less severe.  You must read complete instructions/literature along with all the possible adverse reactions/side effects for all the Medicines you take and that have been prescribed to you. Take any new Medicines after you have completely understood and accpet all the possible adverse reactions/side effects.   Do not drive when taking Pain medications or sleeping medications (Benzodiazepines)  Do not take more than prescribed Pain, Sleep and Anxiety Medications. It is not advisable to combine anxiety,sleep and pain medications without talking with your primary care practitioner  Special Instructions: If you have smoked or chewed Tobacco  in the last 2 yrs please stop smoking, stop any regular Alcohol  and or any Recreational drug use.  Wear Seat belts while driving.  Do not drive if taking any narcotic, mind altering or controlled substances or recreational drugs or alcohol.

## 2020-10-21 NOTE — Discharge Summary (Signed)
Physician Discharge Summary  Rachel Fox WUJ:[id number] DOB: May 04, 1945 DOA: 10/19/2020  PCP: Benita Stabile, MD GI: Dr. Karilyn Cota   Admit date: 10/19/2020 Discharge date: 10/21/2020  Admitted From:  [facility]  Disposition:  [facility]   Recommendations for Outpatient Follow-up:  Follow up with GI in 1 month Please check CBC in 1 week to follow hemoglobin Please check BMP in 1 week to follow up electrolytes.   Discharge Condition: STABLE   CODE STATUS: FULL DIET:  carb modified   Brief Hospitalization Summary: Please see all hospital notes, images, labs for full details of the hospitalization. ADMISSION HPI:  75 y.o. 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  Her Hg baseline had been 13 and after having labs retested today her hemoglobin is down to 6.8.  She tested Hemoccult positive in the stool.  GI was consulted and recommended EGD. 1 unit of PRBC was recommended.   She was started on IV protonix and admission was requested for further management.  HOSPITAL COURSE BY PROBLEM LIST   Acute blood loss anemia - EGD / Colon was unrevealing.  Discussed with Dr. Karilyn Cota ok to restart apixaban.  Pt will need outpatient follow up with GI in 1 month and weekly Hg testing.  Please check CBC in 1 week.    Type 2 DM - diet controlled.    Chronic atrial fibrillation-resume home rate control medications and resume apixaban.    Epilepsy-resume home antiepileptic therapy.  Seizure precautions.   Chronic blood loss anemia-Hg stable at 10.2.  Please check CBC in 1 week and follow up with GI in 1 month.   Hyperlipidemia-resume home statin  therapy given recent CVA.   Essential hypertension-resume home blood pressure lowering medications.   Chronic constipation-resumed home daily laxative therapy.     Hypokalemia - repleted, follow and replete magnesium.     Hypomagnesemia - IV replacement given and repleted.   2 weeks postop s/p left hip hemi - Seen by Dr. Dallas Schimke and he arranged for staple removal and postop imaging.    DVT prophylaxis: apixban/SCDs Code Status: Full  Disposition: return to SNF  Discharge Diagnoses:  Principal Problem:   GI hemorrhage Active Problems:   Cerebrovascular accident (CVA) with involvement of left side of body (HCC)   Hypertension   Type 2 diabetes mellitus (HCC)   Hyperlipidemia   Dysphagia, post-stroke   Atrial fibrillation (HCC)   Hypertension associated with type 2 diabetes mellitus (HCC)   Hyperlipidemia associated with type 2 diabetes mellitus (HCC)   Chronic constipation   Seizure disorder (HCC)   Chronic blood loss anemia  Discharge Instructions:  Allergies as of 10/21/2020       Reactions   Lactose Intolerance (gi) Other (See Comments)   Causes Gas in patient.        Medication List     STOP taking these medications    Botox 100 units Solr injection Generic drug: botulinum toxin Type A       TAKE these medications    acetaminophen 500 MG tablet Commonly known as: TYLENOL Take 1,000 mg by mouth every 8 (eight) hours as needed for moderate pain or headache.   amiodarone 100 MG tablet Commonly  known as: PACERONE Take 1 tablet (100 mg total) by mouth daily.   apixaban 5 MG Tabs tablet Commonly known as: ELIQUIS Take 1 tablet (5 mg total) by mouth 2 (two) times daily.   atorvastatin 80 MG tablet Commonly known as: LIPITOR Take 1 tablet (80 mg total) by mouth daily at 6 PM.   bisacodyl 10 MG suppository Commonly known as: Dulcolax Place 1 suppository (10 mg total) rectally every Monday, Wednesday, and Friday.   Ensure Take 237 mLs by mouth daily  at 12 noon.   ferrous sulfate 325 (65 FE) MG EC tablet Take 1 tablet (325 mg total) by mouth daily with breakfast.   levETIRAcetam 500 MG tablet Commonly known as: KEPPRA Take 500 mg by mouth 2 (two) times daily.   metoprolol tartrate 25 MG tablet Commonly known as: LOPRESSOR Take 0.5 tablets (12.5 mg total) by mouth 2 (two) times daily.   NON FORMULARY Diet: No added salt, ConCHO; lactose intolerant   oxyCODONE 5 MG immediate release tablet Commonly known as: Oxy IR/ROXICODONE Take 1 tablet (5 mg total) by mouth every 6 (six) hours as needed for up to 3 days for severe pain. What changed: when to take this   senna-docusate 8.6-50 MG tablet Commonly known as: Senokot-S Take 2 tablets by mouth 2 (two) times daily.   Venelex Oint Apply 1 application topically as directed. Apply to sacrum and coccyx qshift for prevention. Every Shift; Day, Evening, Night        Follow-up Information     Oliver Barre, MD. Schedule an appointment as soon as possible for a visit in 1 month(s).   Specialties: Orthopedic Surgery, Sports Medicine Why: Hospital Follow Up Contact information: [address] [place] [address] [phone]         Malissa Hippo, MD. Schedule an appointment as soon as possible for a visit in 1 month(s).   Specialty: Gastroenterology Why: Hospital Follow Up Contact information: [address], [address] [place] [address] [phone]                Allergies  Allergen Reactions   Lactose Intolerance (Gi) Other (See Comments)    Causes Gas in patient.   Allergies as of 10/21/2020       Reactions   Lactose Intolerance (gi) Other (See Comments)   Causes Gas in patient.        Medication List     STOP taking these medications    Botox 100 units Solr injection Generic drug: botulinum toxin Type A       TAKE these medications    acetaminophen 500 MG tablet Commonly known as: TYLENOL Take 1,000 mg by mouth every 8 (eight)  hours as needed for moderate pain or headache.   amiodarone 100 MG tablet Commonly known as: PACERONE Take 1 tablet (100 mg total) by mouth daily.   apixaban 5 MG Tabs tablet Commonly known as: ELIQUIS Take 1 tablet (5 mg total) by mouth 2 (two) times daily.   atorvastatin 80 MG tablet Commonly known as: LIPITOR Take 1 tablet (80 mg total) by mouth daily at 6 PM.   bisacodyl 10 MG suppository Commonly known as: Dulcolax Place 1 suppository (10 mg total) rectally every Monday, Wednesday, and Friday.   Ensure Take 237 mLs by mouth daily at 12 noon.   ferrous sulfate 325 (65 FE) MG EC tablet Take 1 tablet (325 mg total) by mouth daily with breakfast.   levETIRAcetam 500 MG tablet Commonly  known as: KEPPRA Take 500 mg by mouth 2 (two) times daily.   metoprolol tartrate 25 MG tablet Commonly known as: LOPRESSOR Take 0.5 tablets (12.5 mg total) by mouth 2 (two) times daily.   NON FORMULARY Diet: No added salt, ConCHO; lactose intolerant   oxyCODONE 5 MG immediate release tablet Commonly known as: Oxy IR/ROXICODONE Take 1 tablet (5 mg total) by mouth every 6 (six) hours as needed for up to 3 days for severe pain. What changed: when to take this   senna-docusate 8.6-50 MG tablet Commonly known as: Senokot-S Take 2 tablets by mouth 2 (two) times daily.   Venelex Oint Apply 1 application topically as directed. Apply to sacrum and coccyx qshift for prevention. Every Shift; Day, Evening, Night       Procedures/Studies: DG Chest 1 View  Result Date: 10/09/2020 CLINICAL DATA:  Larey Seat. Left hip fracture. EXAM: CHEST  1 VIEW COMPARISON:  08/22/2020 FINDINGS: The pacer wires are in good position, unchanged. The cardiac silhouette, mediastinal and hilar contours are normal. The lungs are clear. The bony thorax is intact. IMPRESSION: No acute cardiopulmonary findings. Electronically Signed   By: Rudie Meyer M.D.   On: 10/09/2020 12:50   CT CHEST W CONTRAST  Result Date:  10/19/2020 CLINICAL DATA:  Abdominal bruising to the right hip, fall EXAM: CT CHEST, ABDOMEN, AND PELVIS WITH CONTRAST TECHNIQUE: Multidetector CT imaging of the chest, abdomen and pelvis was performed following the standard protocol during bolus administration of intravenous contrast. CONTRAST:  45mL OMNIPAQUE IOHEXOL 300 MG/ML  SOLN COMPARISON:  CT pelvis 10/09/2020 FINDINGS: CT CHEST FINDINGS Lines/tubes: There is a left chest wall cardiac device in place with leads terminating in the right atrium and right ventricle. Cardiovascular: The heart is at the upper limits of normal for size. There is no pericardial effusion. There is scattered calcified atherosclerotic plaque throughout the aortic arch and thoracic aorta. Mediastinum/Nodes: There is no mediastinal, hilar, or axillary lymphadenopathy. Lungs/Pleura: The trachea and central airways are unremarkable. The lungs are well inflated. There is no focal consolidation or pulmonary edema. There is no evidence of traumatic injury to the lungs. There is mild dependent subsegmental atelectasis in the lung bases. There is a 5 mm nodule in the medial right apex (4-39). There is a 9 mm ground-glass nodule in the left lung apex (4-54). There is a small calcified granuloma in the right upper lobe posteriorly. Musculoskeletal: There is no evidence of fracture or dislocation in the thorax. There is no suspicious osseous lesion. CT ABDOMEN PELVIS FINDINGS Hepatobiliary: There are scattered tiny hypodense lesions throughout the liver, too small to characterize. The gallbladder is unremarkable. There is mild intrahepatic biliary ductal dilatation. Pancreas: Normal. Spleen: Normal. Adrenals/Urinary Tract: The adrenals are normal. There is a 1.3 cm hypodense lesion in the left kidney upper pole, indeterminate by Hounsfield units but likely a cyst or minimally complex cyst. A 0.8 cm lesion in the left kidney lower pole is too small to characterize. There is no hydronephrosis or  hydroureter. The bladder is unremarkable Stomach/Bowel: The stomach is unremarkable. There is no evidence of bowel obstruction. There are scattered colonic diverticuli without evidence of acute diverticulitis. Vascular/Lymphatic: There is scattered calcified atherosclerotic plaque throughout the nonaneurysmal abdominal aorta. The main portal and splenic veins are patent. There is no abdominal or pelvic lymphadenopathy. Reproductive: Status post hysterectomy. No adnexal masses. Other: There is a small amount of presacral free fluid, also present on the prior CT pelvis of 10/09/2020 but slightly increased in volume.  There is no organized or drainable fluid collection. There is no free intraperitoneal air. Musculoskeletal: The patient is status post left hip arthroplasty for treatment of a recent proximal femoral fracture. Hardware alignment appears within expected limits, though the inferior extent is incompletely imaged. There is no evidence of new perihardware fracture. Edema and soft tissue gas about the left hip is likely related to the recent procedure. There is mild degenerative change in the lower lumbar spine. There is no evidence of vertebral fracture. There is skin thickening over the left buttock, unchanged. IMPRESSION: 1. Small amount of free presacral fluid, slightly increased in volume since 10/09/2020, nonspecific but of uncertain etiology. No evidence of traumatic injury to the abdominal or pelvic viscera. 2. Postsurgical changes reflecting left arthroplasty for treatment of a recent proximal femoral fracture without evidence of complication or new fracture. 3. Mild intrahepatic biliary ductal dilatation; no obstructing lesion seen. Correlate with LFTs. 4. Diverticulosis without evidence of acute diverticulitis. 5. Skin thickening over the left buttock is unchanged; correlate with physical exam. 6. 9 mm ground-glass nodule in the left lung apex. Initial follow-up with CT at 6-12 months is recommended  to confirm persistence. If persistent, repeat CT is recommended every 2 years until 5 years of stability has been established. This recommendation follows the consensus statement: Guidelines for Management of Incidental Pulmonary Nodules Detected on CT Images: From the Fleischner Society 2017; Radiology 2017; 284:228-243. 7.  Aortic Atherosclerosis (ICD10-I70.0). Electronically Signed   By: Lesia Hausen MD   On: 10/19/2020 08:36   CT PELVIS WO CONTRAST  Result Date: 10/09/2020 CLINICAL DATA:  Larey Seat. Left hip pain. EXAM: CT PELVIS WITHOUT CONTRAST TECHNIQUE: Multidetector CT imaging of the pelvis was performed following the standard protocol without intravenous contrast. COMPARISON:  None. FINDINGS: Urinary Tract:  The bladder is unremarkable. Bowel: The rectum, sigmoid colon and visualized small bowel loops are unremarkable. Vascular/Lymphatic: Vascular calcifications but no aneurysm. No adenopathy. Reproductive:  Surgically absent. Other:  No free pelvic fluid or pelvic hematoma. Musculoskeletal: As demonstrated on the radiographs there is a mildly displaced left femoral neck fracture. Mild impaction and varus deformity. The right hip is intact. The pubic symphysis and SI joints are intact. No pelvic fractures or bone lesions. Degenerative changes at the pubic symphysis and chondrocalcinosis are noted. There is a subcutaneous contusion/hematoma overlying the left hip area. No significant intramuscular hematoma. IMPRESSION: 1. Mildly displaced left femoral neck fracture. 2. No pelvic fractures or bone lesions. 3. Subcutaneous contusion/hematoma overlying the left hip area. Electronically Signed   By: Rudie Meyer M.D.   On: 10/09/2020 12:53   CT ABDOMEN PELVIS W CONTRAST  Result Date: 10/19/2020 CLINICAL DATA:  Abdominal bruising to the right hip, fall EXAM: CT CHEST, ABDOMEN, AND PELVIS WITH CONTRAST TECHNIQUE: Multidetector CT imaging of the chest, abdomen and pelvis was performed following the standard  protocol during bolus administration of intravenous contrast. CONTRAST:  75mL OMNIPAQUE IOHEXOL 300 MG/ML  SOLN COMPARISON:  CT pelvis 10/09/2020 FINDINGS: CT CHEST FINDINGS Lines/tubes: There is a left chest wall cardiac device in place with leads terminating in the right atrium and right ventricle. Cardiovascular: The heart is at the upper limits of normal for size. There is no pericardial effusion. There is scattered calcified atherosclerotic plaque throughout the aortic arch and thoracic aorta. Mediastinum/Nodes: There is no mediastinal, hilar, or axillary lymphadenopathy. Lungs/Pleura: The trachea and central airways are unremarkable. The lungs are well inflated. There is no focal consolidation or pulmonary edema. There is no evidence of  traumatic injury to the lungs. There is mild dependent subsegmental atelectasis in the lung bases. There is a 5 mm nodule in the medial right apex (4-39). There is a 9 mm ground-glass nodule in the left lung apex (4-54). There is a small calcified granuloma in the right upper lobe posteriorly. Musculoskeletal: There is no evidence of fracture or dislocation in the thorax. There is no suspicious osseous lesion. CT ABDOMEN PELVIS FINDINGS Hepatobiliary: There are scattered tiny hypodense lesions throughout the liver, too small to characterize. The gallbladder is unremarkable. There is mild intrahepatic biliary ductal dilatation. Pancreas: Normal. Spleen: Normal. Adrenals/Urinary Tract: The adrenals are normal. There is a 1.3 cm hypodense lesion in the left kidney upper pole, indeterminate by Hounsfield units but likely a cyst or minimally complex cyst. A 0.8 cm lesion in the left kidney lower pole is too small to characterize. There is no hydronephrosis or hydroureter. The bladder is unremarkable Stomach/Bowel: The stomach is unremarkable. There is no evidence of bowel obstruction. There are scattered colonic diverticuli without evidence of acute diverticulitis.  Vascular/Lymphatic: There is scattered calcified atherosclerotic plaque throughout the nonaneurysmal abdominal aorta. The main portal and splenic veins are patent. There is no abdominal or pelvic lymphadenopathy. Reproductive: Status post hysterectomy. No adnexal masses. Other: There is a small amount of presacral free fluid, also present on the prior CT pelvis of 10/09/2020 but slightly increased in volume. There is no organized or drainable fluid collection. There is no free intraperitoneal air. Musculoskeletal: The patient is status post left hip arthroplasty for treatment of a recent proximal femoral fracture. Hardware alignment appears within expected limits, though the inferior extent is incompletely imaged. There is no evidence of new perihardware fracture. Edema and soft tissue gas about the left hip is likely related to the recent procedure. There is mild degenerative change in the lower lumbar spine. There is no evidence of vertebral fracture. There is skin thickening over the left buttock, unchanged. IMPRESSION: 1. Small amount of free presacral fluid, slightly increased in volume since 10/09/2020, nonspecific but of uncertain etiology. No evidence of traumatic injury to the abdominal or pelvic viscera. 2. Postsurgical changes reflecting left arthroplasty for treatment of a recent proximal femoral fracture without evidence of complication or new fracture. 3. Mild intrahepatic biliary ductal dilatation; no obstructing lesion seen. Correlate with LFTs. 4. Diverticulosis without evidence of acute diverticulitis. 5. Skin thickening over the left buttock is unchanged; correlate with physical exam. 6. 9 mm ground-glass nodule in the left lung apex. Initial follow-up with CT at 6-12 months is recommended to confirm persistence. If persistent, repeat CT is recommended every 2 years until 5 years of stability has been established. This recommendation follows the consensus statement: Guidelines for Management of  Incidental Pulmonary Nodules Detected on CT Images: From the Fleischner Society 2017; Radiology 2017; 284:228-243. 7.  Aortic Atherosclerosis (ICD10-I70.0). Electronically Signed   By: Lesia Hausen MD   On: 10/19/2020 08:36   DG Knee Complete 4 Views Right  Result Date: 10/09/2020 CLINICAL DATA:  Larey Seat. Right knee pain. EXAM: RIGHT KNEE - COMPLETE 4+ VIEW COMPARISON:  None. FINDINGS: Mild degenerative changes are noted with early joint space narrowing and spurring. There is also chondrocalcinosis. No acute fracture or osteochondral lesion. No joint effusion. IMPRESSION: 1. Mild degenerative changes and chondrocalcinosis. 2. No acute bony findings or joint effusion. Electronically Signed   By: Rudie Meyer M.D.   On: 10/09/2020 12:48   DG HIP UNILAT WITH PELVIS 2-3 VIEWS LEFT  Result Date: 10/10/2020  CLINICAL DATA:  75 year old female status post left hip arthroplasty. EXAM: DG HIP (WITH OR WITHOUT PELVIS) 2-3V LEFT COMPARISON:  Left knee radiograph dated 10/09/2020. FINDINGS: There is a total left hip arthroplasty. The arthroplasty components appear intact and in anatomic alignment. There is no acute fracture or dislocation. The bones are osteopenic. Postsurgical changes in the soft tissues of the left hip and cutaneous staples. IMPRESSION: Status post total left hip arthroplasty.  No immediate complication. Electronically Signed   By: Elgie CollardArash  Radparvar M.D.   On: 10/10/2020 17:35   DG Femur Min 2 Views Left  Result Date: 10/09/2020 CLINICAL DATA:  Larey SeatFell. Left hip pain. EXAM: LEFT FEMUR 2 VIEWS COMPARISON:  None. FINDINGS: There is a mildly displaced femoral neck fracture and mild varus deformity. No femoral shaft fracture. IMPRESSION: Mildly displaced femoral neck fracture. Electronically Signed   By: Rudie MeyerP.  Gallerani M.D.   On: 10/09/2020 12:50     Subjective: Pt reports that she had some constipation but no other complaints.    Discharge Exam: Vitals:   10/20/20 2104 10/21/20 0525  BP: (!) 120/56  128/64  Pulse: 61 66  Resp:    Temp: 98.5 F (36.9 C) 98.6 F (37 C)  SpO2: 99% 100%   Vitals:   10/20/20 1142 10/20/20 2101 10/20/20 2104 10/21/20 0525  BP: (!) 152/70 120/76 (!) 120/56 128/64  Pulse: 62 68 61 66  Resp: 15     Temp: 98.6 F (37 C)  98.5 F (36.9 C) 98.6 F (37 C)  TempSrc: Oral  Oral Oral  SpO2: 100%  99% 100%  Weight:      Height:        General: Pt is alert, awake, not in acute distress Cardiovascular: normal S1/S2 +, no rubs, no gallops Respiratory: CTA bilaterally, no wheezing, no rhonchi Abdominal: Soft, NT, ND, bowel sounds + Extremities: no edema, no cyanosis   The results of significant diagnostics from this hospitalization (including imaging, microbiology, ancillary and laboratory) are listed below for reference.     Microbiology: Recent Results (from the past 240 hour(s))  Resp Panel by RT-PCR (Flu A&B, Covid) Nasopharyngeal Swab     Status: None   Collection Time: 10/19/20  6:47 PM   Specimen: Nasopharyngeal Swab; Nasopharyngeal(NP) swabs in vial transport medium  Result Value Ref Range Status   SARS Coronavirus 2 by RT PCR NEGATIVE NEGATIVE Final    Comment: (NOTE) SARS-CoV-2 target nucleic acids are NOT DETECTED.  The SARS-CoV-2 RNA is generally detectable in upper respiratory specimens during the acute phase of infection. The lowest concentration of SARS-CoV-2 viral copies this assay can detect is 138 copies/mL. A negative result does not preclude SARS-Cov-2 infection and should not be used as the sole basis for treatment or other patient management decisions. A negative result may occur with  improper specimen collection/handling, submission of specimen other than nasopharyngeal swab, presence of viral mutation(s) within the areas targeted by this assay, and inadequate number of viral copies(<138 copies/mL). A negative result must be combined with clinical observations, patient history, and epidemiological information. The expected  result is Negative.  Fact Sheet for Patients:  BloggerCourse.comhttps://www.fda.gov/media/152166/download  Fact Sheet for Healthcare Providers:  SeriousBroker.ithttps://www.fda.gov/media/152162/download  This test is no t yet approved or cleared by the [place] FDA and  has been authorized for detection and/or diagnosis of SARS-CoV-2 by FDA under an Emergency Use Authorization (EUA). This EUA will remain  in effect (meaning this test can be used) for the duration of the COVID-19 declaration under  Section 564(b)(1) of the Act, 21 U.S.C.section 360bbb-3(b)(1), unless the authorization is terminated  or revoked sooner.       Influenza A by PCR NEGATIVE NEGATIVE Final   Influenza B by PCR NEGATIVE NEGATIVE Final    Comment: (NOTE) The Xpert Xpress SARS-CoV-2/FLU/RSV plus assay is intended as an aid in the diagnosis of influenza from Nasopharyngeal swab specimens and should not be used as a sole basis for treatment. Nasal washings and aspirates are unacceptable for Xpert Xpress SARS-CoV-2/FLU/RSV testing.  Fact Sheet for Patients: BloggerCourse.com  Fact Sheet for Healthcare Providers: SeriousBroker.it  This test is not yet approved or cleared by the [place] FDA and has been authorized for detection and/or diagnosis of SARS-CoV-2 by FDA under an Emergency Use Authorization (EUA). This EUA will remain in effect (meaning this test can be used) for the duration of the COVID-19 declaration under Section 564(b)(1) of the Act, 21 U.S.C. section 360bbb-3(b)(1), unless the authorization is terminated or revoked.  Performed at [facility], [address], [place], [place] [address]   Surgical pcr screen     Status: None   Collection Time: 10/20/20  5:11 AM   Specimen: Nasal Mucosa; Nasal Swab  Result Value Ref Range Status   MRSA, PCR NEGATIVE NEGATIVE Final   Staphylococcus aureus NEGATIVE NEGATIVE Final    Comment: (NOTE) The Xpert SA Assay (FDA  approved for NASAL specimens in patients 90 years of age and older), is one component of a comprehensive surveillance program. It is not intended to diagnose infection nor to guide or monitor treatment. Performed at [facility], [address], [place], [place] [address]      Labs: BNP (last 3 results) Recent Labs    10/09/20 1133  BNP 31.0   Basic Metabolic Panel: Recent Labs  Lab 10/19/20 0600 10/20/20 0703 10/21/20 0606  NA 141 141  --   K 3.2* 3.5  --   CL 106 108  --   CO2 28 26  --   GLUCOSE 102* 81  --   BUN 14 7*  --   CREATININE 0.78 0.64  --   CALCIUM 9.2 8.7*  --   MG 1.9 1.7 2.2   Liver Function Tests: Recent Labs  Lab 10/19/20 0600  AST 37  ALT 24  ALKPHOS 62  BILITOT 0.8  PROT 6.9  ALBUMIN 2.9*   No results for input(s): LIPASE, AMYLASE in the last 168 hours. No results for input(s): AMMONIA in the last 168 hours. CBC: Recent Labs  Lab 10/15/20 1041 10/16/20 1059 10/19/20 0710 10/19/20 1519 10/20/20 0703 10/20/20 1141 10/21/20 0606  WBC 8.5  --  8.3  --  9.6  --  8.0  HGB 7.1*   < > 6.8* 8.4* 8.9* 10.7* 10.2*  HCT 22.7*   < > 23.5* 26.7* 29.0* 34.3* 33.2*  MCV 93.4  --  100.4*  --  96.0  --  96.0  PLT 303  --  288  --  313  --  280   < > = values in this interval not displayed.   Cardiac Enzymes: No results for input(s): CKTOTAL, CKMB, CKMBINDEX, TROPONINI in the last 168 hours. BNP: Invalid input(s): POCBNP CBG: Recent Labs  Lab 10/20/20 0752 10/20/20 1028 10/20/20 1140 10/20/20 1629 10/21/20 0751  GLUCAP 71 84 109* 102* 75   D-Dimer No results for input(s): DDIMER in the last 72 hours. Hgb A1c No results for input(s): HGBA1C in the last 72 hours. Lipid Profile No results for  input(s): CHOL, HDL, LDLCALC, TRIG, CHOLHDL, LDLDIRECT in the last 72 hours. Thyroid function studies No results for input(s): TSH, T4TOTAL, T3FREE, THYROIDAB in the last 72 hours.  Invalid input(s): FREET3 Anemia work up Recent Labs     10/19/20 0827 10/19/20 1323  VITAMINB12  --  316  FOLATE  --  8.7  FERRITIN  --  109  TIBC  --  219*  IRON  --  92  RETICCTPCT 6.6*  --    Urinalysis    Component Value Date/Time   COLORURINE YELLOW 10/09/2020 1049   APPEARANCEUR HAZY (A) 10/09/2020 1049   LABSPEC 1.019 10/09/2020 1049   PHURINE 6.0 10/09/2020 1049   GLUCOSEU NEGATIVE 10/09/2020 1049   HGBUR NEGATIVE 10/09/2020 1049   BILIRUBINUR NEGATIVE 10/09/2020 1049   KETONESUR 20 (A) 10/09/2020 1049   PROTEINUR NEGATIVE 10/09/2020 1049   NITRITE NEGATIVE 10/09/2020 1049   LEUKOCYTESUR NEGATIVE 10/09/2020 1049   Sepsis Labs Invalid input(s): PROCALCITONIN,  WBC,  LACTICIDVEN Microbiology Recent Results (from the past 240 hour(s))  Resp Panel by RT-PCR (Flu A&B, Covid) Nasopharyngeal Swab     Status: None   Collection Time: 10/19/20  6:47 PM   Specimen: Nasopharyngeal Swab; Nasopharyngeal(NP) swabs in vial transport medium  Result Value Ref Range Status   SARS Coronavirus 2 by RT PCR NEGATIVE NEGATIVE Final    Comment: (NOTE) SARS-CoV-2 target nucleic acids are NOT DETECTED.  The SARS-CoV-2 RNA is generally detectable in upper respiratory specimens during the acute phase of infection. The lowest concentration of SARS-CoV-2 viral copies this assay can detect is 138 copies/mL. A negative result does not preclude SARS-Cov-2 infection and should not be used as the sole basis for treatment or other patient management decisions. A negative result may occur with  improper specimen collection/handling, submission of specimen other than nasopharyngeal swab, presence of viral mutation(s) within the areas targeted by this assay, and inadequate number of viral copies(<138 copies/mL). A negative result must be combined with clinical observations, patient history, and epidemiological information. The expected result is Negative.  Fact Sheet for Patients:  BloggerCourse.com  Fact Sheet for Healthcare  Providers:  SeriousBroker.it  This test is no t yet approved or cleared by the [place] FDA and  has been authorized for detection and/or diagnosis of SARS-CoV-2 by FDA under an Emergency Use Authorization (EUA). This EUA will remain  in effect (meaning this test can be used) for the duration of the COVID-19 declaration under Section 564(b)(1) of the Act, 21 U.S.C.section 360bbb-3(b)(1), unless the authorization is terminated  or revoked sooner.       Influenza A by PCR NEGATIVE NEGATIVE Final   Influenza B by PCR NEGATIVE NEGATIVE Final    Comment: (NOTE) The Xpert Xpress SARS-CoV-2/FLU/RSV plus assay is intended as an aid in the diagnosis of influenza from Nasopharyngeal swab specimens and should not be used as a sole basis for treatment. Nasal washings and aspirates are unacceptable for Xpert Xpress SARS-CoV-2/FLU/RSV testing.  Fact Sheet for Patients: BloggerCourse.com  Fact Sheet for Healthcare Providers: SeriousBroker.it  This test is not yet approved or cleared by the [place] FDA and has been authorized for detection and/or diagnosis of SARS-CoV-2 by FDA under an Emergency Use Authorization (EUA). This EUA will remain in effect (meaning this test can be used) for the duration of the COVID-19 declaration under Section 564(b)(1) of the Act, 21 U.S.C. section 360bbb-3(b)(1), unless the authorization is terminated or revoked.  Performed at [facility], [address], [place], [place] [address]  Surgical pcr screen     Status: None   Collection Time: 10/20/20  5:11 AM   Specimen: Nasal Mucosa; Nasal Swab  Result Value Ref Range Status   MRSA, PCR NEGATIVE NEGATIVE Final   Staphylococcus aureus NEGATIVE NEGATIVE Final    Comment: (NOTE) The Xpert SA Assay (FDA approved for NASAL specimens in patients 59 years of age and older), is one component of a comprehensive surveillance  program. It is not intended to diagnose infection nor to guide or monitor treatment. Performed at [facility], [address] [place], [place] [address]    Time coordinating discharge: 37 minutes   SIGNED:  Standley Dakins, MD  [facility] 10/21/2020, 11:35 AM How to contact the [facility] Attending or Consulting provider 7A - 7P or covering provider during after hours 7P -7A, for this patient?  Check the care team in [facility] and look for a) attending/consulting [facility] provider listed and b) the [facility] team listed Log into [email or web address] and use 's universal password to access. If you do not have the password, please contact the hospital operator. Locate the [facility] provider you are looking for under [facility] and page to a number that you can be directly reached. If you still have difficulty reaching the provider, please page the [facility] (Director on Call) for the Hospitalists listed on amion for assistance.

## 2020-10-22 ENCOUNTER — Non-Acute Institutional Stay (SKILLED_NURSING_FACILITY): Payer: Medicare Other | Admitting: Adult Health

## 2020-10-22 ENCOUNTER — Encounter: Payer: Self-pay | Admitting: Adult Health

## 2020-10-22 DIAGNOSIS — D62 Acute posthemorrhagic anemia: Secondary | ICD-10-CM | POA: Diagnosis not present

## 2020-10-22 DIAGNOSIS — I639 Cerebral infarction, unspecified: Secondary | ICD-10-CM

## 2020-10-22 DIAGNOSIS — I4821 Permanent atrial fibrillation: Secondary | ICD-10-CM | POA: Diagnosis not present

## 2020-10-22 DIAGNOSIS — I495 Sick sinus syndrome: Secondary | ICD-10-CM | POA: Diagnosis not present

## 2020-10-22 DIAGNOSIS — E1165 Type 2 diabetes mellitus with hyperglycemia: Secondary | ICD-10-CM | POA: Diagnosis not present

## 2020-10-22 DIAGNOSIS — G8110 Spastic hemiplegia affecting unspecified side: Secondary | ICD-10-CM | POA: Diagnosis not present

## 2020-10-22 DIAGNOSIS — E1169 Type 2 diabetes mellitus with other specified complication: Secondary | ICD-10-CM

## 2020-10-22 DIAGNOSIS — I152 Hypertension secondary to endocrine disorders: Secondary | ICD-10-CM

## 2020-10-22 DIAGNOSIS — K922 Gastrointestinal hemorrhage, unspecified: Secondary | ICD-10-CM

## 2020-10-22 DIAGNOSIS — K5909 Other constipation: Secondary | ICD-10-CM | POA: Diagnosis not present

## 2020-10-22 DIAGNOSIS — G40909 Epilepsy, unspecified, not intractable, without status epilepticus: Secondary | ICD-10-CM

## 2020-10-22 DIAGNOSIS — S72002D Fracture of unspecified part of neck of left femur, subsequent encounter for closed fracture with routine healing: Secondary | ICD-10-CM | POA: Diagnosis not present

## 2020-10-22 DIAGNOSIS — E785 Hyperlipidemia, unspecified: Secondary | ICD-10-CM

## 2020-10-22 DIAGNOSIS — E1159 Type 2 diabetes mellitus with other circulatory complications: Secondary | ICD-10-CM | POA: Diagnosis not present

## 2020-10-22 NOTE — Progress Notes (Signed)
Location:  [facility] Room Number: 135-P Place of Service:  SNF (31)   CODE STATUS: Full code  Allergies  Allergen Reactions   Lactose Intolerance (Gi) Other (See Comments)    Causes Gas in patient.    Chief Complaint  Patient presents with   Hospitalization Follow-up    Hospital follow-up    HPI:  She is a 75 year old woman who has been hospitalized from 10-19-20 through 10-21-20. Her medical history includes: diabetes; cva; afib; with pacemaker. Left hip fracture. She presented to the ED with a hgb of 6.8. GI was consulted EGD/colon were not revealing. She did receive one unit packed cells. She is here for short term rehab with her goal to return back home. She denies any uncontrolled pain; no increased weakness; no known blood in stool. She will continue to be followed for her chronic illnesses including: Hypertension associated with type 2 diabetes mellitus:    Permanent atrial fibrillation/sinus node dysfunction:. Controlled type 2 diabetes mellitus with hyperglycemia without current use of insulin:  Hyperlipidemia associated with type 2 diabetes mellitus  Past Medical History:  Diagnosis Date   Atrial fibrillation (HCC)    CVA (cerebral vascular accident) (HCC)    High cholesterol    Hypertension    Seizures (HCC)    Type 2 diabetes mellitus (HCC)     Past Surgical History:  Procedure Laterality Date   ABDOMINAL HYSTERECTOMY     HIP ARTHROPLASTY Left 10/10/2020   Procedure: ARTHROPLASTY BIPOLAR HIP (HEMIARTHROPLASTY);  Surgeon: Oliver Barre, MD;  Location: [facility] OR[facility];  Service: Orthopedics;  Laterality: Left;   PACEMAKER IMPLANT N/A 08/22/2020   Procedure: PACEMAKER IMPLANT;  Surgeon: Marinus Maw, MD;  Location: [facility] INVASIVE CV LAB;  Service: Cardiovascular;  Laterality: N/A;   TUBAL LIGATION      Social History   Socioeconomic History   Marital status: Divorced    Spouse name: Not on file   Number of children: Not on file   Years of education:  Not on file   Highest education level: Not on file  Occupational History   Not on file  Tobacco Use   Smoking status: Former    Types: Cigarettes    Quit date: 04/14/1988    Years since quitting: 32.5   Smokeless tobacco: Never  Vaping Use   Vaping Use: Never used  Substance and Sexual Activity   Alcohol use: Never   Drug use: Never   Sexual activity: Not on file  Other Topics Concern   Not on file  Social History Narrative   Not on file   Social Determinants of Health   Financial Resource Strain: Not on file  Food Insecurity: Not on file  Transportation Needs: Not on file  Physical Activity: Not on file  Stress: Not on file  Social Connections: Not on file  Intimate Partner Violence: Not on file   Family History  Problem Relation Age of Onset   Stroke Mother    Heart attack Father    Stroke Sister    Heart attack Sister       VITAL SIGNS BP 132/72   Pulse 64   Temp 98 F (36.7 C)   Resp 20   Ht 5\' 3"  (1.6 m)   Wt 123 lb (55.8 kg)   SpO2 100%   BMI 21.79 kg/m   Outpatient Encounter Medications as of 10/22/2020  Medication Sig   acetaminophen (TYLENOL) 500 MG tablet Take 1,000 mg by mouth every  8 (eight) hours as needed for moderate pain or headache.   amiodarone (PACERONE) 100 MG tablet Take 1 tablet (100 mg total) by mouth daily.   atorvastatin (LIPITOR) 80 MG tablet Take 1 tablet (80 mg total) by mouth daily at 6 PM.   Balsam [place]-Castor Oil (VENELEX) OINT Apply 1 application topically as directed. Apply to sacrum and coccyx qshift for prevention. Every Shift; Day, Evening, Night   bisacodyl (DULCOLAX) 10 MG suppository Place 1 suppository (10 mg total) rectally every Monday, Wednesday, and Friday.   Ensure (ENSURE) Take 237 mLs by mouth daily at 12 noon.   ferrous sulfate 325 (65 FE) MG EC tablet Take 1 tablet (325 mg total) by mouth daily with breakfast.   levETIRAcetam (KEPPRA) 500 MG tablet Take 500 mg by mouth 2 (two) times daily.   metoprolol  tartrate (LOPRESSOR) 25 MG tablet Take 0.5 tablets (12.5 mg total) by mouth 2 (two) times daily.   NON FORMULARY Diet: No added salt, ConCHO; lactose intolerant   oxyCODONE (OXY IR/ROXICODONE) 5 MG immediate release tablet Take 1 tablet (5 mg total) by mouth every 6 (six) hours as needed for up to 3 days for severe pain.   senna-docusate (SENOKOT-S) 8.6-50 MG tablet Take 2 tablets by mouth 2 (two) times daily.   [DISCONTINUED] apixaban (ELIQUIS) 5 MG TABS tablet Take 1 tablet (5 mg total) by mouth 2 (two) times daily.   No facility-administered encounter medications on file as of 10/22/2020.     SIGNIFICANT DIAGNOSTIC EXAMS  REVIEWED TODAY;   10-19-20: ct of chest abdomen and pelvis:  1. Small amount of free presacral fluid, slightly increased in volume since 10/09/2020, nonspecific but of uncertain etiology. No evidence of traumatic injury to the abdominal or pelvic viscera. 2. Postsurgical changes reflecting left arthroplasty for treatment of a recent proximal femoral fracture without evidence of complication or new fracture. 3. Mild intrahepatic biliary ductal dilatation; no obstructing lesion seen. Correlate with LFTs. 4. Diverticulosis without evidence of acute diverticulitis. 5. Skin thickening over the left buttock is unchanged; correlate with physical exam. 6. 9 mm ground-glass nodule in the left lung apex. Initial follow-up with CT at 6-12 months is recommended to confirm persistence 7.  Aortic Atherosclerosis  LABS REVIEWED TODAY;   10-19-20: wbc 8.3; hgb 6.8; hct 23.5; mcv 100.4 plt 288; glucose 102; bun 14; creat 0.78; k+ 3.2; na++ 141; ca 9.2; GFR>60; liver normal albumin 2.9; vit B 12: 316; folate 8.7; iron 92; tibc 219; ferritin 109 10-21-20: wbc 8.0; hgb 10.2; hct 33.2; mcv 96.0 plt 280   Review of Systems  Constitutional:  Negative for malaise/fatigue.  Respiratory:  Negative for cough and shortness of breath.   Cardiovascular:  Negative for chest pain, palpitations and leg  swelling.  Gastrointestinal:  Negative for abdominal pain, constipation and heartburn.  Musculoskeletal:  Negative for back pain, joint pain and myalgias.  Skin: Negative.   Neurological:  Negative for dizziness.  Psychiatric/Behavioral:  The patient is not nervous/anxious.    Physical Exam Constitutional:      General: She is not in acute distress.    Appearance: She is well-developed. She is not diaphoretic.     Comments: Thin   Neck:     Thyroid: No thyromegaly.  Cardiovascular:     Rate and Rhythm: Normal rate and regular rhythm.     Pulses: Normal pulses.     Heart sounds: Normal heart sounds.  Pulmonary:     Effort: Pulmonary effort is normal. No respiratory distress.  Breath sounds: Normal breath sounds.  Abdominal:     General: Bowel sounds are normal. There is no distension.     Palpations: Abdomen is soft.     Tenderness: There is no abdominal tenderness.  Musculoskeletal:     Cervical back: Neck supple.     Right lower leg: No edema.     Left lower leg: No edema.     Comments: Left extremities with contractures Status post left hip hemiarthroplasty   Lymphadenopathy:     Cervical: No cervical adenopathy.  Skin:    General: Skin is warm and dry.  Neurological:     Mental Status: She is alert. Mental status is at baseline.  Psychiatric:        Mood and Affect: Mood normal.     ASSESSMENT/ PLAN:  TODAY  Gastrointestinal hemorrhage unspecified gastrointestinal hemorrhage type:  is status post transfusion; hgb stable will monitor   2. Hypertension associated with type 2 diabetes mellitus: is stable b/p 132/72 will continue lopressor 12.5 mg twice daily   3. Permanent atrial fibrillation/sinus node dysfunction: heart rate is stable will continue amiodarone 100 mg daily and lopressor 12.5 mg twice daily for rate control; will restart eliquis 2.5 mg twice daily per GI recommendation.   4. Controlled type 2 diabetes mellitus with hyperglycemia without current  use of insulin: is stable will monitor   5. Hyperlipidemia associated with type 2 diabetes mellitus: is stable will continue lipitor 80 mg daily   6. Chronic constipation: is stable; will continue senna s 2 tabs twice daily   7. Cerebrovascular accident (CVA) with left side of body/spastic hemiparesis: is stable will continue to monitor her status.   8. Seizure disorder: no recent reports of seizure activity; will continue keppra 500 mg twice daily   9. Closed fracture of left hip with routine healing subsequent encounter: is stable will continue therapy as directed and has oxycodone 5 mg every 6 hours as needed  10. Acute blood anemia as cause of postoperative anemia: is stable hgb 10.2 will continue iron daily and will repeat h/h.     Synthia Innocent NP [facility]  Contact [phone] Monday through Friday 8am- 5pm  After hours call [phone]

## 2020-10-24 ENCOUNTER — Non-Acute Institutional Stay (SKILLED_NURSING_FACILITY): Payer: Medicare Other | Admitting: Internal Medicine

## 2020-10-24 ENCOUNTER — Encounter: Payer: Self-pay | Admitting: Internal Medicine

## 2020-10-24 DIAGNOSIS — D62 Acute posthemorrhagic anemia: Secondary | ICD-10-CM

## 2020-10-24 DIAGNOSIS — E1165 Type 2 diabetes mellitus with hyperglycemia: Secondary | ICD-10-CM

## 2020-10-24 DIAGNOSIS — D500 Iron deficiency anemia secondary to blood loss (chronic): Secondary | ICD-10-CM

## 2020-10-24 DIAGNOSIS — S72002D Fracture of unspecified part of neck of left femur, subsequent encounter for closed fracture with routine healing: Secondary | ICD-10-CM | POA: Diagnosis not present

## 2020-10-24 NOTE — Assessment & Plan Note (Addendum)
Current H/H 10.2/33.2; prior H/H 6.4/21.4 EGD and colonoscopy by Dr. Karilyn Cota, GI negative Indices hypochromic, macrocytic Anemia improved post 1 unit PRBC  On Eliquis prophylaxis ROS negative for bleeding dyscrasias & none reported by Staff Monitor CBC weekly

## 2020-10-24 NOTE — Assessment & Plan Note (Signed)
DM with vascular complications Glucose range as IP  81-102 Current A1c:5.6% , pre Diabetes A1c goal : < 8% No hypoglycemia No medication indicated

## 2020-10-24 NOTE — Progress Notes (Signed)
Cardiology Office Note    Date:  10/25/2020   ID:  Rachel Fox, Rachel Fox 26-Apr-1945, MRN [id number]  PCP:  Benita Stabile, MD  Cardiologist: Dina Rich, MD   EP: Dr. Ladona Ridgel  Chief Complaint  Patient presents with   Follow-up    3 month visit    History of Present Illness:    Rachel Fox is a 75 y.o. female with past medical history of paroxysmal atrial fibrillation complicated by tachy-brady syndrome (s/p St. Jude PPM placement in 08/2020), HTN, HLD and history of CVA who presents to the office today for 63-month follow-up.  She was admitted to [facility] in 09/2020 after suffering a mechanical fall at home and found to have a left hip fracture. Cardiology was consulted for preoperative clearance and given no recent anginal symptoms, she was cleared to proceed. She was continued on Amiodarone 100 mg daily along with Eliquis 5 mg twice daily for anticoagulation (temporarily held due to anemia but resumed prior to discharge).   She was again admitted from the [facility] to [facility] on 10/19/2020 for anemia with hemoglobin at 6.4. Eliquis was held on admission and she received transfusions with Hgb improving to 10.7. GI was consulted and she underwent EGD and colonoscopy which showed no significant findings. Outpatient GI follow-up was recommended for consideration of small bowel capsule study. She was discharged back to the [facility] on 10/21/2020.  In talking with the patient today, she reports working with physical therapy on a daily basis at [facility] and is hopeful to return home soon. She denies any recent chest pain or palpitations. No recent orthopnea, PND or dyspnea on exertion. She does experience intermittent lower extremity edema but this improves with elevating her legs.  She denies any recent melena, hematochezia or hematuria. She was scheduled to have repeat labs earlier this morning but reports the phlebotomist could not find a vein.   Past Medical History:  Diagnosis  Date   Atrial fibrillation (HCC)    CVA (cerebral vascular accident) (HCC)    High cholesterol    Hypertension    Seizures (HCC)    Type 2 diabetes mellitus (HCC)     Past Surgical History:  Procedure Laterality Date   ABDOMINAL HYSTERECTOMY     COLONOSCOPY WITH PROPOFOL N/A 10/20/2020   Procedure: COLONOSCOPY WITH PROPOFOL;  Surgeon: Malissa Hippo, MD;  Location: [facility] ENDO SUITE;  Service: Endoscopy;  Laterality: N/A;   ESOPHAGOGASTRODUODENOSCOPY (EGD) WITH PROPOFOL N/A 10/20/2020   Procedure: ESOPHAGOGASTRODUODENOSCOPY (EGD) WITH PROPOFOL;  Surgeon: Malissa Hippo, MD;  Location: [facility] ENDO SUITE;  Service: Endoscopy;  Laterality: N/A;   HIP ARTHROPLASTY Left 10/10/2020   Procedure: ARTHROPLASTY BIPOLAR HIP (HEMIARTHROPLASTY);  Surgeon: Oliver Barre, MD;  Location: [facility] ORS;  Service: Orthopedics;  Laterality: Left;   PACEMAKER IMPLANT N/A 08/22/2020   Procedure: PACEMAKER IMPLANT;  Surgeon: Marinus Maw, MD;  Location: [facility] INVASIVE CV LAB;  Service: Cardiovascular;  Laterality: N/A;   TUBAL LIGATION      Current Medications: Outpatient Medications Prior to Visit  Medication Sig Dispense Refill   acetaminophen (TYLENOL) 500 MG tablet Take 1,000 mg by mouth every 8 (eight) hours as needed for moderate pain or headache.     amiodarone (PACERONE) 100 MG tablet Take 1 tablet (100 mg total) by mouth daily. 90 tablet 3   atorvastatin (LIPITOR) 80 MG tablet Take 1 tablet (80 mg total) by mouth daily at 6 PM. 30 tablet 1   Balsam  Peru-Castor Oil (VENELEX) OINT Apply 1 application topically as directed. Apply to sacrum and coccyx qshift for prevention. Every Shift; Day, Evening, Night     bisacodyl (DULCOLAX) 10 MG suppository Place 1 suppository (10 mg total) rectally every Monday, Wednesday, and Friday. 15 suppository 2   Ensure (ENSURE) Take 237 mLs by mouth daily at 12 noon.     ferrous sulfate 325 (65 FE) MG EC tablet Take 1 tablet (325 mg total) by mouth daily with breakfast. 90 tablet 3    levETIRAcetam (KEPPRA) 500 MG tablet Take 500 mg by mouth 2 (two) times daily.     metoprolol tartrate (LOPRESSOR) 25 MG tablet Take 0.5 tablets (12.5 mg total) by mouth 2 (two) times daily. 60 tablet 3   NON FORMULARY Diet: No added salt, ConCHO; lactose intolerant     senna-docusate (SENOKOT-S) 8.6-50 MG tablet Take 2 tablets by mouth 2 (two) times daily. 120 tablet 2   No facility-administered medications prior to visit.     Allergies:   Lactose intolerance (gi)   Social History   Socioeconomic History   Marital status: Divorced    Spouse name: Not on file   Number of children: Not on file   Years of education: Not on file   Highest education level: Not on file  Occupational History   Not on file  Tobacco Use   Smoking status: Former    Types: Cigarettes    Quit date: 04/14/1988    Years since quitting: 32.5   Smokeless tobacco: Never  Vaping Use   Vaping Use: Never used  Substance and Sexual Activity   Alcohol use: Never   Drug use: Never   Sexual activity: Not on file  Other Topics Concern   Not on file  Social History Narrative   Not on file   Social Determinants of Health   Financial Resource Strain: Not on file  Food Insecurity: Not on file  Transportation Needs: Not on file  Physical Activity: Not on file  Stress: Not on file  Social Connections: Not on file     Family History:  The patient's family history includes Heart attack in her father and sister; Stroke in her mother and sister.   Review of Systems:    Please see the history of present illness.     All other systems reviewed and are otherwise negative except as noted above.   Physical Exam:    VS:  BP (!) 148/66   Pulse 63   Ht 5\' 3"  (1.6 m)   Wt 122 lb (55.3 kg)   SpO2 99%   BMI 21.61 kg/m    General: Well developed, well nourished,female appearing in no acute distress. Sitting in wheelchair.  Head: Normocephalic, atraumatic. Neck: No carotid bruits. JVD not elevated.  Lungs:  Respirations regular and unlabored, without wheezes or rales.  Heart: Regular rate and rhythm. No S3 or S4.  No murmur, no rubs, or gallops appreciated. Abdomen: Appears non-distended. No obvious abdominal masses. Msk:  Strength and tone appear normal for age. No obvious joint deformities or effusions. Extremities: No clubbing or cyanosis. No pitting edema.  Distal pedal pulses are 2+ bilaterally. Neuro: Alert and oriented X 3. Moves all extremities spontaneously. No focal deficits noted. Psych:  Responds to questions appropriately with a normal affect. Skin: No rashes or lesions noted  Wt Readings from Last 3 Encounters:  10/25/20 122 lb (55.3 kg)  10/22/20 123 lb (55.8 kg)  10/19/20 123 lb 0.3 oz (55.8 kg)  Studies/Labs Reviewed:   EKG:  EKG is not ordered today.   Recent Labs: 10/09/2020: B Natriuretic Peptide 31.0; TSH 4.107 10/19/2020: ALT 24 10/20/2020: BUN 7; Creatinine, Ser 0.64; Potassium 3.5; Sodium 141 10/21/2020: Hemoglobin 10.2; Magnesium 2.2; Platelets 280   Lipid Panel    Component Value Date/Time   CHOL 184 09/21/2018 1120   TRIG 107 09/21/2018 1120   HDL 42 09/21/2018 1120   CHOLHDL 4.4 09/21/2018 1120   VLDL 21 09/21/2018 1120   LDLCALC 121 (H) 09/21/2018 1120    Additional studies/ records that were reviewed today include:   Echocardiogram: 05/2020 IMPRESSIONS     1. Left ventricular ejection fraction, by estimation, is 55 to 60%. The  left ventricle has normal function. The left ventricle has no regional  wall motion abnormalities. Left ventricular diastolic parameters are  indeterminate.   2. Right ventricular systolic function is normal. The right ventricular  size is normal.   3. Left atrial size was severely dilated.   4. The mitral valve is normal in structure. Mild mitral valve  regurgitation. No evidence of mitral stenosis.   5. The aortic valve is tricuspid. Aortic valve regurgitation is not  visualized. No aortic stenosis is present.   6.  The inferior vena cava is normal in size with greater than 50%  respiratory variability, suggesting right atrial pressure of 3 mmHg.   Event Monitor: 05/2020 14 day monitor No patient reported symptoms Min HR 31, Max HR 121, Avg HR 46 Rare supraventricular ectopy. Episodes of afib. Rare ventricular ectopy Pauses up to 4.7 seconds, typically post afib termination pauses in early AM hours. Episodes of junctional rhythm rates in the 30s in later afternoon     Patch Wear Time:  14 days and 0 hours (2022-03-03T15:05:44-0500 to 2022-03-17T16:05:48-0400)   Patient had a min HR of 31 bpm, max HR of 121 bpm, and avg HR of 46 bpm. Predominant underlying rhythm was Sinus Rhythm. Slight P wave morphology changes were noted. Atrial Fibrillation/Flutter occurred (6% burden), ranging from 49-121 bpm (avg of 78 bpm), the longest lasting 59 mins 24 secs with an avg rate of 69 bpm. 138 Pauses occurred, the longest lasting 4.7 secs (13 bpm). Junctional Rhythm was present. Isolated SVEs were rare (<1.0%), SVE Couplets were rare (<1.0%), and SVE Triplets were rare (<1.0%). Isolated VEs were rare (<1.0%, 345), VE Couplets were rare (<1.0%, 86), and VE Triplets were rare (<1.0%, 6).   Assessment:    1. PAF (paroxysmal atrial fibrillation) (HCC)   2. Tachy-brady syndrome (HCC)   3. Essential hypertension   4. Hyperlipidemia LDL goal <70   5. Chronic blood loss anemia      Plan:   In order of problems listed above:  1. Paroxysmal Atrial Fibrillation with Tachy-brady Syndrome - She is s/p St. Jude PPM placement in 08/2020. She denies any recent palpitations and her heart rate is well controlled in the 60's during today's visit. She remains on Amiodarone 100 mg daily (LFT's and TSH recently checked and within a normal range) and Lopressor 12.5 mg twice daily.  - No reports of active bleeding but she was recently hospitalized for anemia as outlined below.  Repeat labs are pending at SNF. She remains on  Eliquis 5 mg twice daily at this time which is the appropriate dose given her age, weight and kidney function. - She is scheduled for remote device check in 11/2020 but is unsure if she will be home from SNF at that time.I will send a  note to the [facility] to make them aware as she does have in-office follow-up scheduled with Dr. Ladona Ridgelaylor [place].  2. HTN - Her BP is elevated at 148/66 during today's visit but this has overall been well controlled.  Continue current regimen with Lopressor 12.5 mg twice daily.  3. HLD - Followed by her PCP and she remains on Atorvastatin 80 mg daily.  4. Anemia - Her Hgb was down to 6.4 during her recent admission and EGD and colonoscopy showed no significant findings. Hgb had improved to 10.2 as of 10/21/2020. She was encouraged to keep scheduled outpatient follow-up with GI.   Medication Adjustments/Labs and Tests Ordered: Current medicines are reviewed at length with the patient today.  Concerns regarding medicines are outlined above.  Medication changes, Labs and Tests ordered today are listed in the Patient Instructions below. Patient Instructions  Medication Instructions:   No changes. Continue current medication regimen.   Labwork:  None.  Testing/Procedures:  None.   Follow-Up:  With Randall AnBrittany Kolin Erdahl, PA-C or Dr. Wyline MoodBranch in 6 months.   Any Other Special Instructions Will Be Listed Below (If Applicable).     If you need a refill on your cardiac medications before your next appointment, please call your pharmacy.   Signed, Ellsworth LennoxBrittany M Aneudy Champlain, PA-C  10/25/2020 5:47 PM    [facility] [address] [place], [place] [address] Phone: [phone] Fax: [phone]

## 2020-10-24 NOTE — Patient Instructions (Signed)
See assessment and plan under each diagnosis in the problem list and acutely for this visit 

## 2020-10-24 NOTE — Assessment & Plan Note (Addendum)
Wound Care Nurse assessed the changes of the left hip.  Subcutaneous induration is suggested.  Steri-Strips were reapplied.  Because of persistent soreness in the operative site area and inguinal area; imaging will be completed.  PT/OT will be held until results of those x-rays are received.  Orthopedic reassessment may be necessary.

## 2020-10-24 NOTE — Progress Notes (Signed)
NURSING HOME LOCATION:  [facility] ROOM NUMBER:  [id number]  CODE STATUS:  Full Code  PCP:  Benita Stabile MD  This is a nursing facility follow up visit for Nursing Facility readmission within 30 days.   Interim medical record and care since last SNF visit was updated with review of diagnostic studies and change in clinical status since last visit were documented.  HPI: Patient was rehospitalized 8/5 - 10/21/2020 for acute anemia.Hemoglobin had been 13 at baseline but on recheck was 6.8.  Hemoccult positive stools were documented.  1 unit packed red cells was administered.  IV Protonix was initiated. She was status post left hip fracture repair on 7/27 with postop apixaban DVT prophylaxis for recent embolic CVA in the setting of A. fib.   EGD and colonoscopic studies were unrevealing.  Dr. Karilyn Cota authorized reinitiation of the apixaban.  Weekly hemoglobin testing was recommended with GI follow-up 1 month post discharge. Type 2 diabetes was diet controlled.  Other diagnoses include seizure disorder, dyslipidemia, essential hypertension, and chemistries/electrolyte imbalances including hypokalemia and hypomagnesemia. Apparently while hospitalized Dr. Dallas Schimke authorized removing the staples after follow up imaging.  Review of systems: She denies any bleeding dyscrasias or active GI symptoms at this time.  Her major concern is soreness in the left hip at the operative site and inguinal area.  She wants to defer any PT/OT because of this discomfort. Wound Care Nurse checked the site and reapplied Steri-Strips.  Constitutional: No fever, significant weight change, fatigue  Eyes: No redness, discharge, pain, vision change ENT/mouth: No nasal congestion,  purulent discharge, earache, change in hearing, sore throat  Cardiovascular: No chest pain, palpitations, paroxysmal nocturnal dyspnea, claudication, edema  Respiratory: No cough, sputum production, hemoptysis, DOE, significant snoring,  apnea   Gastrointestinal: No heartburn, dysphagia, abdominal pain, nausea /vomiting, rectal bleeding, melena, change in bowels Genitourinary: No dysuria, hematuria, pyuria, incontinence, nocturia Dermatologic: No rash, pruritus, change in appearance of skin Neurologic: No dizziness, headache, syncope, seizures Psychiatric: No significant anxiety, depression, insomnia, anorexia Endocrine: No change in hair/skin/nails, excessive thirst, excessive hunger, excessive urination  Hematologic/lymphatic: No significant bruising, lymphadenopathy, abnormal bleeding Allergy/immunology: No itchy/watery eyes, significant   Physical exam:  Pertinent or positive findings: The left nasolabial fold is decreased compared to the right.  There is a large benign hyperpigmented nevus at the left chin.  First and second heart sounds are increased.  Dorsalis pedis pulses are stronger than posterior tibial pulses.  The left upper extremity is flexed at the elbow across the upper abdomen.  The left lower extremity is in a large foam bootie. The left extremities are weaker than the right.  At the operative site there is subcutaneous induration as well as faint hyperpigmentation without frank cellulitis or hematoma.  General appearance: Adequately nourished; no acute distress, increased work of breathing is present.   Lymphatic: No lymphadenopathy about the head, neck, axilla. Eyes: No conjunctival inflammation or lid edema is present. There is no scleral icterus. Ears:  External ear exam shows no significant lesions or deformities.   Nose:  External nasal examination shows no deformity or inflammation. Nasal mucosa are pink and moist without lesions, exudates Oral exam:  Lips and gums are healthy appearing. There is no oropharyngeal erythema or exudate. Neck:  No thyromegaly, masses, tenderness noted.    Heart:  Normal rate and regular rhythm without gallop, murmur, click, rub .  Lungs: Chest clear to auscultation without  wheezes, rhonchi, rales, rubs. Abdomen: Bowel sounds are normal.  Abdomen is soft and nontender with no organomegaly, hernias, masses. GU: Deferred  Extremities:  No cyanosis, clubbing, edema  Neurologic exam :Balance, Rhomberg, finger to nose testing could not be completed due to clinical state Skin: Warm & dry w/o tenting. No significant rash.  See summary under each active problem in the Problem List with associated updated therapeutic plan

## 2020-10-25 ENCOUNTER — Other Ambulatory Visit: Payer: Self-pay | Admitting: *Deleted

## 2020-10-25 ENCOUNTER — Telehealth (INDEPENDENT_AMBULATORY_CARE_PROVIDER_SITE_OTHER): Payer: Self-pay | Admitting: *Deleted

## 2020-10-25 ENCOUNTER — Ambulatory Visit (INDEPENDENT_AMBULATORY_CARE_PROVIDER_SITE_OTHER): Payer: Medicare Other | Admitting: Student

## 2020-10-25 ENCOUNTER — Encounter: Payer: Self-pay | Admitting: Student

## 2020-10-25 VITALS — BP 148/66 | HR 63 | Ht 63.0 in | Wt 122.0 lb

## 2020-10-25 DIAGNOSIS — D500 Iron deficiency anemia secondary to blood loss (chronic): Secondary | ICD-10-CM | POA: Diagnosis not present

## 2020-10-25 DIAGNOSIS — I480 Paroxysmal atrial fibrillation: Secondary | ICD-10-CM

## 2020-10-25 DIAGNOSIS — I63511 Cerebral infarction due to unspecified occlusion or stenosis of right middle cerebral artery: Secondary | ICD-10-CM | POA: Diagnosis not present

## 2020-10-25 DIAGNOSIS — E785 Hyperlipidemia, unspecified: Secondary | ICD-10-CM | POA: Diagnosis not present

## 2020-10-25 DIAGNOSIS — I495 Sick sinus syndrome: Secondary | ICD-10-CM | POA: Diagnosis not present

## 2020-10-25 DIAGNOSIS — I10 Essential (primary) hypertension: Secondary | ICD-10-CM | POA: Diagnosis not present

## 2020-10-25 NOTE — Patient Instructions (Signed)
Medication Instructions:   No changes. Continue current medication regimen.   Labwork:  None.  Testing/Procedures:  None.   Follow-Up:  With Randall An, PA-C or Dr. Wyline Mood in 6 months.   Any Other Special Instructions Will Be Listed Below (If Applicable).     If you need a refill on your cardiac medications before your next appointment, please call your pharmacy.

## 2020-10-25 NOTE — Telephone Encounter (Signed)
Patient needs 1 month hospital f/u (mid Sept) - call Shanda Bumps at [facility] [phone]

## 2020-10-25 NOTE — Patient Outreach (Signed)
[facility] Post- Acute Care Coordinator follow up. Member screened for potential [facility] Care Management needs. Verified in [facility] (Patient Ilda Foil) member transitioned from [facility] on 10/21/20.  Telephone call made to Ms. Shawhan to discuss [facility] Care Management services. No answer. HIPAA compliant voicemail message left to request return call.   Raiford Noble, MSN, RN,BSN [facility] Post Acute Care Coordinator [phone] [facility]) [phone]  (Toll free office)

## 2020-10-26 ENCOUNTER — Other Ambulatory Visit (HOSPITAL_COMMUNITY)
Admission: RE | Admit: 2020-10-26 | Discharge: 2020-10-26 | Disposition: A | Discharge status: Home / Self Care | Payer: Medicare Other | Source: Skilled Nursing Facility | Attending: Adult Health | Admitting: Adult Health

## 2020-10-26 DIAGNOSIS — M79622 Pain in left upper arm: Secondary | ICD-10-CM | POA: Diagnosis not present

## 2020-10-26 DIAGNOSIS — K922 Gastrointestinal hemorrhage, unspecified: Secondary | ICD-10-CM | POA: Insufficient documentation

## 2020-10-26 DIAGNOSIS — G8112 Spastic hemiplegia affecting left dominant side: Secondary | ICD-10-CM | POA: Diagnosis not present

## 2020-10-26 LAB — BASIC METABOLIC PANEL
Anion gap: 9 (ref 5–15)
BUN: 13 mg/dL (ref 8–23)
CO2: 25 mmol/L (ref 22–32)
Calcium: 9.3 mg/dL (ref 8.9–10.3)
Chloride: 105 mmol/L (ref 98–111)
Creatinine, Ser: 0.8 mg/dL (ref 0.44–1.00)
GFR, Estimated: >60 mL/min (ref >60)
Glucose, Bld: 101 mg/dL — ABNORMAL HIGH (ref 70–99)
Potassium: 4.5 mmol/L (ref 3.5–5.1)
Sodium: 139 mmol/L (ref 135–145)

## 2020-10-26 LAB — HEMOGLOBIN AND HEMATOCRIT, BLOOD
HCT: 40.1 % (ref 36.0–46.0)
Hemoglobin: 12.2 g/dL (ref 12.0–15.0)

## 2020-10-29 ENCOUNTER — Non-Acute Institutional Stay (SKILLED_NURSING_FACILITY): Payer: Medicare Other | Admitting: Adult Health

## 2020-10-29 ENCOUNTER — Other Ambulatory Visit: Payer: Self-pay | Admitting: Adult Health

## 2020-10-29 DIAGNOSIS — S72002D Fracture of unspecified part of neck of left femur, subsequent encounter for closed fracture with routine healing: Secondary | ICD-10-CM

## 2020-10-29 MED ORDER — OXYCODONE HCL 5 MG PO TABS: 5.0000 mg | ORAL_TABLET | Freq: Every day | ORAL | 0 refills | Status: AC | PRN

## 2020-10-29 NOTE — Progress Notes (Signed)
Location:  [facility] Room Number: 135 Place of Service:  SNF (31)   CODE STATUS: full code   Allergies  Allergen Reactions   Lactose Intolerance (Gi) Other (See Comments)    Causes Gas in patient.    Chief Complaint  Patient presents with   Acute Visit    Pain management     HPI:  She continues to have pain with therapy which is interfering in her ability to participate in therapy. She is status post left hip hemiarthroplasty. She tells me that therapy is "hard" and "painful". She had been on oxycodone on an as needed basis; she has not be using this option. Her pain is worse with therapy.    Past Medical History:  Diagnosis Date   Atrial fibrillation (HCC)    CVA (cerebral vascular accident) (HCC)    High cholesterol    Hypertension    Seizures (HCC)    Type 2 diabetes mellitus (HCC)     Past Surgical History:  Procedure Laterality Date   ABDOMINAL HYSTERECTOMY     COLONOSCOPY WITH PROPOFOL N/A 10/20/2020   Procedure: COLONOSCOPY WITH PROPOFOL;  Surgeon: Malissa Hippo, MD;  [facility];  Service: Endoscopy;  Laterality: N/A;   ESOPHAGOGASTRODUODENOSCOPY (EGD) WITH PROPOFOL N/A 10/20/2020   Procedure: ESOPHAGOGASTRODUODENOSCOPY (EGD) WITH PROPOFOL;  Surgeon: Malissa Hippo, MD;  [facility]: [facility];  Service: Endoscopy;  Laterality: N/A;   HIP ARTHROPLASTY Left 10/10/2020   Procedure: ARTHROPLASTY BIPOLAR HIP (HEMIARTHROPLASTY);  Surgeon: Oliver Barre, MD;  Location: [facility];  Service: Orthopedics;  Laterality: Left;   PACEMAKER IMPLANT N/A 08/22/2020   Procedure: PACEMAKER IMPLANT;  Surgeon: Marinus Maw, MD;  Location: [facility];  Service: Cardiovascular;  Laterality: N/A;   TUBAL LIGATION      Social History   Socioeconomic History   Marital status: Divorced    Spouse name: Not on file   Number of children: Not on file   Years of education: Not on file   Highest education level: Not on file  Occupational  History   Not on file  Tobacco Use   Smoking status: Former    Types: Cigarettes    Quit date: 04/14/1988    Years since quitting: 32.5   Smokeless tobacco: Never  Vaping Use   Vaping Use: Never used  Substance and Sexual Activity   Alcohol use: Never   Drug use: Never   Sexual activity: Not on file  Other Topics Concern   Not on file  Social History Narrative   Not on file   Social Determinants of Health   Financial Resource Strain: Not on file  Food Insecurity: Not on file  Transportation Needs: Not on file  Physical Activity: Not on file  Stress: Not on file  Social Connections: Not on file  Intimate Partner Violence: Not on file   Family History  Problem Relation Age of Onset   Stroke Mother    Heart attack Father    Stroke Sister    Heart attack Sister       VITAL SIGNS BP (!) 148/64   Pulse 64   Resp 20   Ht 5\' 3"  (1.6 m)   Wt 123 lb (55.8 kg)   BMI 21.79 kg/m   Outpatient Encounter Medications as of 10/29/2020  Medication Sig   acetaminophen (TYLENOL) 500 MG tablet Take 1,000 mg by mouth every 8 (eight) hours as needed for moderate pain or headache.  amiodarone (PACERONE) 100 MG tablet Take 1 tablet (100 mg total) by mouth daily.   atorvastatin (LIPITOR) 80 MG tablet Take 1 tablet (80 mg total) by mouth daily at 6 PM.   Balsam [place]-Castor Oil (VENELEX) OINT Apply 1 application topically as directed. Apply to sacrum and coccyx qshift for prevention. Every Shift; Day, Evening, Night   bisacodyl (DULCOLAX) 10 MG suppository Place 1 suppository (10 mg total) rectally every Monday, Wednesday, and Friday.   Ensure (ENSURE) Take 237 mLs by mouth daily at 12 noon.   ferrous sulfate 325 (65 FE) MG EC tablet Take 1 tablet (325 mg total) by mouth daily with breakfast.   levETIRAcetam (KEPPRA) 500 MG tablet Take 500 mg by mouth 2 (two) times daily.   metoprolol tartrate (LOPRESSOR) 25 MG tablet Take 0.5 tablets (12.5 mg total) by mouth 2 (two) times daily.   NON  FORMULARY Diet: No added salt, ConCHO; lactose intolerant   oxyCODONE (ROXICODONE) 5 MG immediate release tablet Take 1 tablet (5 mg total) by mouth daily as needed for up to 7 days for severe pain. Give 30 minutes prior to therapy   senna-docusate (SENOKOT-S) 8.6-50 MG tablet Take 2 tablets by mouth 2 (two) times daily.   No facility-administered encounter medications on file as of 10/29/2020.     SIGNIFICANT DIAGNOSTIC EXAMS   REVIEWED PREVIOUS    10-19-20: ct of chest abdomen and pelvis:  1. Small amount of free presacral fluid, slightly increased in volume since 10/09/2020, nonspecific but of uncertain etiology. No evidence of traumatic injury to the abdominal or pelvic viscera. 2. Postsurgical changes reflecting left arthroplasty for treatment of a recent proximal femoral fracture without evidence of complication or new fracture. 3. Mild intrahepatic biliary ductal dilatation; no obstructing lesion seen. Correlate with LFTs. 4. Diverticulosis without evidence of acute diverticulitis. 5. Skin thickening over the left buttock is unchanged; correlate with physical exam. 6. 9 mm ground-glass nodule in the left lung apex. Initial follow-up with CT at 6-12 months is recommended to confirm persistence 7.  Aortic Atherosclerosis  NO NEW EXAMS.   LABS REVIEWED PREVIOUS   10-19-20: wbc 8.3; hgb 6.8; hct 23.5; mcv 100.4 plt 288; glucose 102; bun 14; creat 0.78; k+ 3.2; na++ 141; ca 9.2; GFR>60; liver normal albumin 2.9; vit B 12: 316; folate 8.7; iron 92; tibc 219; ferritin 109 10-21-20: wbc 8.0; hgb 10.2; hct 33.2; mcv 96.0 plt 280   NO NEW LABS.   Review of Systems  Constitutional:  Negative for malaise/fatigue.  Respiratory:  Negative for cough and shortness of breath.   Cardiovascular:  Negative for chest pain, palpitations and leg swelling.  Gastrointestinal:  Negative for abdominal pain, constipation and heartburn.  Musculoskeletal:  Positive for joint pain. Negative for back pain and  myalgias.       Left hip pain   Skin: Negative.   Neurological:  Negative for dizziness.  Psychiatric/Behavioral:  The patient is not nervous/anxious.    Physical Exam Constitutional:      General: She is not in acute distress.    Appearance: She is well-developed. She is not diaphoretic.     Comments: thin  Neck:     Thyroid: No thyromegaly.  Cardiovascular:     Rate and Rhythm: Normal rate and regular rhythm.     Pulses: Normal pulses.     Heart sounds: Normal heart sounds.  Pulmonary:     Effort: Pulmonary effort is normal. No respiratory distress.     Breath sounds: Normal breath  sounds.  Abdominal:     General: Bowel sounds are normal. There is no distension.     Palpations: Abdomen is soft.     Tenderness: There is no abdominal tenderness.  Musculoskeletal:     Cervical back: Neck supple.     Right lower leg: No edema.     Left lower leg: No edema.     Comments: Left extremities with contractures Status post left hip hemiarthroplasty    Lymphadenopathy:     Cervical: No cervical adenopathy.  Skin:    General: Skin is warm and dry.  Neurological:     Mental Status: She is alert. Mental status is at baseline.  Psychiatric:        Mood and Affect: Mood normal.     ASSESSMENT/ PLAN:  TODAY  Closed left hip fracture with routine healing subsequent encounter: will change her to oxycodone 5 mg prior to therapy on a daily basis through 11-05-20 will monitor    Synthia Innocent NP [facility]  Contact [phone] Monday through Friday 8am- 5pm  After hours call [phone]

## 2020-10-30 NOTE — Telephone Encounter (Signed)
-----   Message from Oliver Barre, MD sent at 10/23/2020 11:04 PM EDT ----- Good evening  I saw Mrs. Arras in the hospital, got an XR and her staples were removed.  She does not need to come back next week.  I can see her in about 3-4 weeks, which would be around 6 weeks postop.  Let me know if you have any questions  Loraine Leriche

## 2020-10-30 NOTE — Telephone Encounter (Signed)
We had left messages for patient to reschedule appointment per Dr Dallas Schimke, and today, 10/30/20, reached patient's son, Rachel Fox (on list of designated contacts).  Aware of reschedule of appointment from 10/31/20 to 12/12/20.  I have also contacted [facility]; notified Dekita at facility, ph# [phone], [employer].

## 2020-10-31 ENCOUNTER — Encounter: Payer: Medicare Other | Admitting: Orthopedic Surgery

## 2020-10-31 ENCOUNTER — Other Ambulatory Visit: Payer: Self-pay | Admitting: Adult Health

## 2020-11-01 ENCOUNTER — Other Ambulatory Visit: Payer: Self-pay | Admitting: Adult Health

## 2020-11-01 ENCOUNTER — Encounter (HOSPITAL_COMMUNITY)
Admission: RE | Admit: 2020-11-01 | Discharge: 2020-11-01 | Disposition: A | Discharge status: Home / Self Care | Payer: Medicare Other | Source: Skilled Nursing Facility | Attending: Internal Medicine | Admitting: Internal Medicine

## 2020-11-01 LAB — HEPATIC FUNCTION PANEL
ALT: 16 U/L (ref 0–44)
AST: 22 U/L (ref 15–41)
Albumin: 3.5 g/dL (ref 3.5–5.0)
Alkaline Phosphatase: 87 U/L (ref 38–126)
Bilirubin, Direct: 0.1 mg/dL (ref 0.0–0.2)
Indirect Bilirubin: 0.6 mg/dL (ref 0.3–0.9)
Total Bilirubin: 0.7 mg/dL (ref 0.3–1.2)
Total Protein: 7.3 g/dL (ref 6.5–8.1)

## 2020-11-01 LAB — TSH: TSH: 1.245 u[IU]/mL (ref 0.350–4.500)

## 2020-11-02 ENCOUNTER — Encounter: Payer: Self-pay | Admitting: Adult Health

## 2020-11-06 ENCOUNTER — Non-Acute Institutional Stay (SKILLED_NURSING_FACILITY): Payer: Medicare Other | Admitting: Adult Health

## 2020-11-06 ENCOUNTER — Encounter: Payer: Self-pay | Admitting: Adult Health

## 2020-11-06 ENCOUNTER — Other Ambulatory Visit: Payer: Self-pay | Admitting: Adult Health

## 2020-11-06 DIAGNOSIS — S72002D Fracture of unspecified part of neck of left femur, subsequent encounter for closed fracture with routine healing: Secondary | ICD-10-CM

## 2020-11-06 MED ORDER — OXYCODONE HCL 5 MG PO TABS: 5.0000 mg | ORAL_TABLET | Freq: Every day | ORAL | 0 refills | Status: AC | PRN

## 2020-11-06 NOTE — Progress Notes (Signed)
s  Location:  [facility] Room Number: 135 Place of Service:  SNF (31)   CODE STATUS: full code   Allergies  Allergen Reactions   Lactose Intolerance (Gi) Other (See Comments)    Causes Gas in patient.    Chief Complaint  Patient presents with   Acute Visit    Pain management     HPI:  She is status post left hip arthroplasty on 10-10-20. She continues to participate in therapy. She continues to have pain with therapy. Her oxycodone was stopped. She states that her pain interferes with her ability to participate in therapy.   Past Medical History:  Diagnosis Date   Atrial fibrillation [facility])    Cerebrovascular accident (CVA) with involvement of left side of body (HCC) 09/20/2018   CVA (cerebral vascular accident) (HCC)    High cholesterol    Hypertension    Seizures (HCC)    Type 2 diabetes mellitus (HCC)     Past Surgical History:  Procedure Laterality Date   ABDOMINAL HYSTERECTOMY     COLONOSCOPY WITH PROPOFOL N/A 10/20/2020   Procedure: COLONOSCOPY WITH PROPOFOL;  Surgeon: Malissa Hippo, MD;  [facility];  Service: Endoscopy;  Laterality: N/A;   ESOPHAGOGASTRODUODENOSCOPY (EGD) WITH PROPOFOL N/A 10/20/2020   Procedure: ESOPHAGOGASTRODUODENOSCOPY (EGD) WITH PROPOFOL;  Surgeon: Malissa Hippo, MD;  [facility];  Service: Endoscopy;  Laterality: N/A;   HIP ARTHROPLASTY Left 10/10/2020   Procedure: ARTHROPLASTY BIPOLAR HIP (HEMIARTHROPLASTY);  Surgeon: Oliver Barre, MD;  Location: [facility];  Service: Orthopedics;  Laterality: Left;   PACEMAKER IMPLANT N/A 08/22/2020   Procedure: PACEMAKER IMPLANT;  Surgeon: Marinus Maw, MD;  Location: [facility] CV [facility];  Service: Cardiovascular;  Laterality: N/A;   TUBAL LIGATION      Social History   Socioeconomic History   Marital status: Divorced    Spouse name: Not on file   Number of children: Not on file   Years of education: Not on file   Highest education level: Not on file   Occupational History   Not on file  Tobacco Use   Smoking status: Former    Types: Cigarettes    Quit date: 04/14/1988    Years since quitting: 32.5   Smokeless tobacco: Never  Vaping Use   Vaping Use: Never used  Substance and Sexual Activity   Alcohol use: Never   Drug use: Never   Sexual activity: Not on file  Other Topics Concern   Not on file  Social History Narrative   Not on file   Social Determinants of Health   Financial Resource Strain: Not on file  Food Insecurity: Not on file  Transportation Needs: Not on file  Physical Activity: Not on file  Stress: Not on file  Social Connections: Not on file  Intimate Partner Violence: Not on file   Family History  Problem Relation Age of Onset   Stroke Mother    Heart attack Father    Stroke Sister    Heart attack Sister       VITAL SIGNS BP 119/72   Pulse 80   Temp 98.7 F (37.1 C)   Resp 18   Ht 5\' 3"  (1.6 m)   Wt 123 lb (55.8 kg)   BMI 21.79 kg/m   Outpatient Encounter Medications as of 11/06/2020  Medication Sig   acetaminophen (TYLENOL) 500 MG tablet Take 1,000 mg by mouth every 8 (eight) hours as needed for moderate pain  or headache.   amiodarone (PACERONE) 100 MG tablet Take 1 tablet (100 mg total) by mouth daily.   atorvastatin (LIPITOR) 80 MG tablet Take 1 tablet (80 mg total) by mouth daily at 6 PM.   Balsam [place]-Castor Oil (VENELEX) OINT Apply 1 application topically as directed. Apply to sacrum and coccyx qshift for prevention. Every Shift; Day, Evening, Night   bisacodyl (DULCOLAX) 10 MG suppository Place 1 suppository (10 mg total) rectally every Monday, Wednesday, and Friday.   Ensure (ENSURE) Take 237 mLs by mouth daily at 12 noon.   ferrous sulfate 325 (65 FE) MG EC tablet Take 1 tablet (325 mg total) by mouth daily with breakfast.   levETIRAcetam (KEPPRA) 500 MG tablet Take 500 mg by mouth 2 (two) times daily.   metoprolol tartrate (LOPRESSOR) 25 MG tablet Take 0.5 tablets (12.5 mg total)  by mouth 2 (two) times daily.   NON FORMULARY Diet: No added salt, ConCHO; lactose intolerant   oxyCODONE (ROXICODONE) 5 MG immediate release tablet Take 1 tablet (5 mg total) by mouth daily as needed for up to 7 days for severe pain.   senna-docusate (SENOKOT-S) 8.6-50 MG tablet Take 2 tablets by mouth 2 (two) times daily.   No facility-administered encounter medications on file as of 11/06/2020.     SIGNIFICANT DIAGNOSTIC EXAMS   REVIEWED PREVIOUS    10-19-20: ct of chest abdomen and pelvis:  1. Small amount of free presacral fluid, slightly increased in volume since 10/09/2020, nonspecific but of uncertain etiology. No evidence of traumatic injury to the abdominal or pelvic viscera. 2. Postsurgical changes reflecting left arthroplasty for treatment of a recent proximal femoral fracture without evidence of complication or new fracture. 3. Mild intrahepatic biliary ductal dilatation; no obstructing lesion seen. Correlate with LFTs. 4. Diverticulosis without evidence of acute diverticulitis. 5. Skin thickening over the left buttock is unchanged; correlate with physical exam. 6. 9 mm ground-glass nodule in the left lung apex. Initial follow-up with CT at 6-12 months is recommended to confirm persistence 7.  Aortic Atherosclerosis  NO NEW EXAMS.   LABS REVIEWED PREVIOUS   10-19-20: wbc 8.3; hgb 6.8; hct 23.5; mcv 100.4 plt 288; glucose 102; bun 14; creat 0.78; k+ 3.2; na++ 141; ca 9.2; GFR>60; liver normal albumin 2.9; vit B 12: 316; folate 8.7; iron 92; tibc 219; ferritin 109 10-21-20: wbc 8.0; hgb 10.2; hct 33.2; mcv 96.0 plt 280   NO NEW LABS.   Review of Systems  Constitutional:  Negative for malaise/fatigue.  Respiratory:  Negative for cough and shortness of breath.   Cardiovascular:  Negative for chest pain, palpitations and leg swelling.  Gastrointestinal:  Negative for abdominal pain, constipation and heartburn.  Musculoskeletal:  Positive for joint pain. Negative for back pain  and myalgias.  Skin: Negative.   Neurological:  Negative for dizziness.  Psychiatric/Behavioral:  The patient is not nervous/anxious.    Physical Exam Constitutional:      General: She is not in acute distress.    Appearance: She is well-developed. She is not diaphoretic.  Neck:     Thyroid: No thyromegaly.  Cardiovascular:     Rate and Rhythm: Normal rate and regular rhythm.     Pulses: Normal pulses.     Heart sounds: Normal heart sounds.  Pulmonary:     Effort: Pulmonary effort is normal. No respiratory distress.     Breath sounds: Normal breath sounds.  Abdominal:     General: Bowel sounds are normal. There is no distension.  Palpations: Abdomen is soft.     Tenderness: There is no abdominal tenderness.  Musculoskeletal:     Cervical back: Neck supple.     Right lower leg: No edema.     Left lower leg: No edema.     Comments:  Left extremities with contractures Status post left hip hemiarthroplasty     Lymphadenopathy:     Cervical: No cervical adenopathy.  Skin:    General: Skin is warm and dry.  Neurological:     Mental Status: She is alert. Mental status is at baseline.  Psychiatric:        Mood and Affect: Mood normal.      ASSESSMENT/ PLAN:  TODAY  Closed fracture of left hip with routine healing subsequent encounter  Will change oxycodone to 5 mg prior to therapy through 11-13-20    Synthia Innocent NP [facility]  Contact [phone] Monday through Friday 8am- 5pm  After hours call [phone]

## 2020-11-14 ENCOUNTER — Encounter: Payer: Self-pay | Admitting: Adult Health

## 2020-11-14 ENCOUNTER — Non-Acute Institutional Stay (SKILLED_NURSING_FACILITY): Payer: Medicare Other | Admitting: Adult Health

## 2020-11-14 DIAGNOSIS — S72002D Fracture of unspecified part of neck of left femur, subsequent encounter for closed fracture with routine healing: Secondary | ICD-10-CM

## 2020-11-14 NOTE — Progress Notes (Signed)
Location:  [facility] Room Number: 135 Place of Service:  SNF (31) Rachel Innocent, NP   CODE STATUS: FULL CODE  Allergies  Allergen Reactions   Lactose Intolerance (Gi) Other (See Comments)    Causes Gas in patient.    Chief Complaint  Patient presents with   Acute Visit    Pain management    HPI:  She is status post left hip fracture with hemiarthroplasty on 10-10-20. She does continue to participate in therapy. She is now off oxycodone on 11-13-20. She is taking tylenol 1 gm three times daily as needed . She rates the pain 8/10 with movement. The pain is "achy" and "hurts". There are no physical signs of distress with movement.   Past Medical History:  Diagnosis Date   Atrial fibrillation [facility])    Cerebrovascular accident (CVA) with involvement of left side of body (HCC) 09/20/2018   CVA (cerebral vascular accident) (HCC)    High cholesterol    Hypertension    Seizures (HCC)    Type 2 diabetes mellitus (HCC)     Past Surgical History:  Procedure Laterality Date   ABDOMINAL HYSTERECTOMY     COLONOSCOPY WITH PROPOFOL N/A 10/20/2020   Procedure: COLONOSCOPY WITH PROPOFOL;  Surgeon: Malissa Hippo, MD;  [facility];  Service: Endoscopy;  Laterality: N/A;   ESOPHAGOGASTRODUODENOSCOPY (EGD) WITH PROPOFOL N/A 10/20/2020   Procedure: ESOPHAGOGASTRODUODENOSCOPY (EGD) WITH PROPOFOL;  Surgeon: Malissa Hippo, MD;  [facility];  Service: Endoscopy;  Laterality: N/A;   HIP ARTHROPLASTY Left 10/10/2020   Procedure: ARTHROPLASTY BIPOLAR HIP (HEMIARTHROPLASTY);  Surgeon: Oliver Barre, MD;  Location: [facility];  Service: Orthopedics;  Laterality: Left;   PACEMAKER IMPLANT N/A 08/22/2020   Procedure: PACEMAKER IMPLANT;  Surgeon: Marinus Maw, MD;  [facility]: [facility];  Service: Cardiovascular;  Laterality: N/A;   TUBAL LIGATION      Social History   Socioeconomic History   Marital status: Divorced    Spouse name: Not on file    Number of children: Not on file   Years of education: Not on file   Highest education level: Not on file  Occupational History   Not on file  Tobacco Use   Smoking status: Former    Types: Cigarettes    Quit date: 04/14/1988    Years since quitting: 32.6   Smokeless tobacco: Never  Vaping Use   Vaping Use: Never used  Substance and Sexual Activity   Alcohol use: Never   Drug use: Never   Sexual activity: Not on file  Other Topics Concern   Not on file  Social History Narrative   Not on file   Social Determinants of Health   Financial Resource Strain: Not on file  Food Insecurity: Not on file  Transportation Needs: Not on file  Physical Activity: Not on file  Stress: Not on file  Social Connections: Not on file  Intimate Partner Violence: Not on file   Family History  Problem Relation Age of Onset   Stroke Mother    Heart attack Father    Stroke Sister    Heart attack Sister       VITAL SIGNS BP (!) 172/72   Pulse 68   Temp 97.9 F (36.6 C)   Resp 20   Ht 5\' 3"  (1.6 m)   Wt 123 lb (55.8 kg)   SpO2 100%   BMI 21.79 kg/m   Outpatient Encounter Medications as of 11/14/2020  Medication Sig   acetaminophen (TYLENOL) 500 MG tablet Take 1,000 mg by mouth every 8 (eight) hours as needed for moderate pain or headache.   amiodarone (PACERONE) 200 MG tablet Take 200 mg by mouth daily. 1/2 tablet   apixaban (ELIQUIS) 2.5 MG TABS tablet Take by mouth 2 (two) times daily.   atorvastatin (LIPITOR) 80 MG tablet Take 1 tablet (80 mg total) by mouth daily at 6 PM.   Balsam [place]-Castor Oil (VENELEX) OINT Apply 1 application topically as directed. Apply to sacrum and coccyx qshift for prevention. Every Shift; Day, Evening, Night   bisacodyl (DULCOLAX) 10 MG suppository Place 1 suppository (10 mg total) rectally every Monday, Wednesday, and Friday.   Ensure (ENSURE) Take 237 mLs by mouth daily at 12 noon.   ferrous sulfate 325 (65 FE) MG EC tablet Take 1 tablet (325 mg total)  by mouth daily with breakfast.   levETIRAcetam (KEPPRA) 500 MG tablet Take 500 mg by mouth 2 (two) times daily.   Lidocaine 4 % PTCH Apply 1 patch topically. apply one patch to the left hip in the AM and remove at HS.   metoprolol tartrate (LOPRESSOR) 25 MG tablet Take 0.5 tablets (12.5 mg total) by mouth 2 (two) times daily.   NON FORMULARY Diet: No added salt, ConCHO; lactose intolerant   senna-docusate (SENOKOT-S) 8.6-50 MG tablet Take 2 tablets by mouth 2 (two) times daily.   [DISCONTINUED] amiodarone (PACERONE) 100 MG tablet Take 1 tablet (100 mg total) by mouth daily.   No facility-administered encounter medications on file as of 11/14/2020.     SIGNIFICANT DIAGNOSTIC EXAMS  REVIEWED PREVIOUS    10-19-20: ct of chest abdomen and pelvis:  1. Small amount of free presacral fluid, slightly increased in volume since 10/09/2020, nonspecific but of uncertain etiology. No evidence of traumatic injury to the abdominal or pelvic viscera. 2. Postsurgical changes reflecting left arthroplasty for treatment of a recent proximal femoral fracture without evidence of complication or new fracture. 3. Mild intrahepatic biliary ductal dilatation; no obstructing lesion seen. Correlate with LFTs. 4. Diverticulosis without evidence of acute diverticulitis. 5. Skin thickening over the left buttock is unchanged; correlate with physical exam. 6. 9 mm ground-glass nodule in the left lung apex. Initial follow-up with CT at 6-12 months is recommended to confirm persistence 7.  Aortic Atherosclerosis  NO NEW EXAMS.   LABS REVIEWED PREVIOUS   10-19-20: wbc 8.3; hgb 6.8; hct 23.5; mcv 100.4 plt 288; glucose 102; bun 14; creat 0.78; k+ 3.2; na++ 141; ca 9.2; GFR>60; liver normal albumin 2.9; vit B 12: 316; folate 8.7; iron 92; tibc 219; ferritin 109 10-21-20: wbc 8.0; hgb 10.2; hct 33.2; mcv 96.0 plt 280   NO NEW LABS.   Review of Systems  Constitutional:  Negative for malaise/fatigue.  Respiratory:  Negative for  cough and shortness of breath.   Cardiovascular:  Negative for chest pain, palpitations and leg swelling.  Gastrointestinal:  Negative for abdominal pain, constipation and heartburn.  Musculoskeletal:  Positive for joint pain. Negative for back pain and myalgias.       Left hip pain   Skin: Negative.   Neurological:  Negative for dizziness.  Psychiatric/Behavioral:  The patient is not nervous/anxious.     Physical Exam Constitutional:      General: She is not in acute distress.    Appearance: She is well-developed. She is not diaphoretic.  Neck:     Thyroid: No thyromegaly.  Cardiovascular:     Rate and Rhythm: Normal  rate and regular rhythm.     Pulses: Normal pulses.     Heart sounds: Normal heart sounds.  Pulmonary:     Effort: Pulmonary effort is normal. No respiratory distress.     Breath sounds: Normal breath sounds.  Abdominal:     General: Bowel sounds are normal. There is no distension.     Palpations: Abdomen is soft.     Tenderness: There is no abdominal tenderness.  Musculoskeletal:     Cervical back: Neck supple.     Right lower leg: No edema.     Left lower leg: No edema.     Comments: Left extremities with contractures Status post left hip hemiarthroplasty      Lymphadenopathy:     Cervical: No cervical adenopathy.  Skin:    General: Skin is warm and dry.  Neurological:     Mental Status: She is alert. Mental status is at baseline.  Psychiatric:        Mood and Affect: Mood normal.      ASSESSMENT/ PLAN:  TODAY  Closed fracture of left hip with routine healing subsequent healing:  will make the following changes: tylenol cr 650 mg every 8 hours; lidocaine 4% patch to left hip and will monitor her status   Rachel Innocent NP [facility] Adult Medicine  Contact [phone] Monday through Friday 8am- 5pm  After hours call [phone]

## 2020-11-20 ENCOUNTER — Non-Acute Institutional Stay (SKILLED_NURSING_FACILITY): Payer: Medicare Other | Admitting: Adult Health

## 2020-11-20 ENCOUNTER — Encounter: Payer: Self-pay | Admitting: Adult Health

## 2020-11-20 DIAGNOSIS — E1159 Type 2 diabetes mellitus with other circulatory complications: Secondary | ICD-10-CM

## 2020-11-20 DIAGNOSIS — I152 Hypertension secondary to endocrine disorders: Secondary | ICD-10-CM

## 2020-11-20 NOTE — Progress Notes (Signed)
Location:  [facility] Room Number: 135-P Place of Service:  SNF (31)   CODE STATUS: Full Code   Allergies  Allergen Reactions   Lactose Intolerance (Gi) Other (See Comments)    Causes Gas in patient.    Chief Complaint  Patient presents with   Acute Visit    Elevated blood pressure     HPI:  Her blood pressure readings have been elevated with a reading of 200/70 prior ot medications; after her medications: 170/74. She denies any headaches; no blurring of vision. No reports of dizziness present. She is taking lopressor 12.5 mg twice daily with a pulse in the 60's.   Past Medical History:  Diagnosis Date   Atrial fibrillation [facility])    Cerebrovascular accident (CVA) with involvement of left side of body (HCC) 09/20/2018   CVA (cerebral vascular accident) (HCC)    High cholesterol    Hypertension    Seizures (HCC)    Type 2 diabetes mellitus (HCC)     Past Surgical History:  Procedure Laterality Date   ABDOMINAL HYSTERECTOMY     COLONOSCOPY WITH PROPOFOL N/A 10/20/2020   Procedure: COLONOSCOPY WITH PROPOFOL;  Surgeon: Malissa Hippo, MD;  [facility]: [facility];  Service: Endoscopy;  Laterality: N/A;   ESOPHAGOGASTRODUODENOSCOPY (EGD) WITH PROPOFOL N/A 10/20/2020   Procedure: ESOPHAGOGASTRODUODENOSCOPY (EGD) WITH PROPOFOL;  Surgeon: Malissa Hippo, MD;  Location: [facility];  Service: Endoscopy;  Laterality: N/A;   HIP ARTHROPLASTY Left 10/10/2020   Procedure: ARTHROPLASTY BIPOLAR HIP (HEMIARTHROPLASTY);  Surgeon: Oliver Barre, MD;  Location: [facility] ORS;  Service: Orthopedics;  Laterality: Left;   PACEMAKER IMPLANT N/A 08/22/2020   Procedure: PACEMAKER IMPLANT;  Surgeon: Marinus Maw, MD;  Location: [facility] INVASIVE CV LAB;  Service: Cardiovascular;  Laterality: N/A;   TUBAL LIGATION      Social History   Socioeconomic History   Marital status: Divorced    Spouse name: Not on file   Number of children: Not on file   Years of education: Not on file    Highest education level: Not on file  Occupational History   Not on file  Tobacco Use   Smoking status: Former    Types: Cigarettes    Quit date: 04/14/1988    Years since quitting: 32.6   Smokeless tobacco: Never  Vaping Use   Vaping Use: Never used  Substance and Sexual Activity   Alcohol use: Never   Drug use: Never   Sexual activity: Not on file  Other Topics Concern   Not on file  Social History Narrative   Not on file   Social Determinants of Health   Financial Resource Strain: Not on file  Food Insecurity: Not on file  Transportation Needs: Not on file  Physical Activity: Not on file  Stress: Not on file  Social Connections: Not on file  Intimate Partner Violence: Not on file   Family History  Problem Relation Age of Onset   Stroke Mother    Heart attack Father    Stroke Sister    Heart attack Sister       VITAL SIGNS BP (!) 146/70   Pulse 60   Temp 98.1 F (36.7 C)   Resp 20   Ht 5\' 3"  (1.6 m)   Wt 112 lb 3.2 oz (50.9 kg)   SpO2 100%   BMI 19.88 kg/m   Outpatient Encounter Medications as of 11/20/2020  Medication Sig   acetaminophen (TYLENOL) 650 MG CR tablet  Take 650 mg by mouth 3 (three) times daily.   amiodarone (PACERONE) 200 MG tablet Take 100 mg by mouth daily.   apixaban (ELIQUIS) 2.5 MG TABS tablet Take by mouth 2 (two) times daily.   atorvastatin (LIPITOR) 80 MG tablet Take 1 tablet (80 mg total) by mouth daily at 6 PM.   Balsam [place]-Castor Oil (VENELEX) OINT Apply 1 application topically as directed. Apply to sacrum and coccyx qshift for prevention. Every Shift; Day, Evening, Night   bisacodyl (DULCOLAX) 10 MG suppository Place 1 suppository (10 mg total) rectally every Monday, Wednesday, and Friday.   Ensure (ENSURE) Take 237 mLs by mouth daily at 12 noon.   ferrous sulfate 325 (65 FE) MG EC tablet Take 1 tablet (325 mg total) by mouth daily with breakfast.   levETIRAcetam (KEPPRA) 500 MG tablet Take 500 mg by mouth 2 (two) times daily.    Lidocaine 4 % PTCH Apply 1 patch topically. apply one patch to the left hip in the AM and remove at HS.   metoprolol tartrate (LOPRESSOR) 25 MG tablet Take 0.5 tablets (12.5 mg total) by mouth 2 (two) times daily.   NON FORMULARY Diet: No added salt, ConCHO; lactose intolerant   senna-docusate (SENOKOT-S) 8.6-50 MG tablet Take 2 tablets by mouth 2 (two) times daily.   [DISCONTINUED] acetaminophen (TYLENOL) 500 MG tablet Take 1,000 mg by mouth every 8 (eight) hours as needed for moderate pain or headache.   No facility-administered encounter medications on file as of 11/20/2020.     SIGNIFICANT DIAGNOSTIC EXAMS  REVIEWED PREVIOUS    10-19-20: ct of chest abdomen and pelvis:  1. Small amount of free presacral fluid, slightly increased in volume since 10/09/2020, nonspecific but of uncertain etiology. No evidence of traumatic injury to the abdominal or pelvic viscera. 2. Postsurgical changes reflecting left arthroplasty for treatment of a recent proximal femoral fracture without evidence of complication or new fracture. 3. Mild intrahepatic biliary ductal dilatation; no obstructing lesion seen. Correlate with LFTs. 4. Diverticulosis without evidence of acute diverticulitis. 5. Skin thickening over the left buttock is unchanged; correlate with physical exam. 6. 9 mm ground-glass nodule in the left lung apex. Initial follow-up with CT at 6-12 months is recommended to confirm persistence 7.  Aortic Atherosclerosis  NO NEW EXAMS.   LABS REVIEWED PREVIOUS   10-19-20: wbc 8.3; hgb 6.8; hct 23.5; mcv 100.4 plt 288; glucose 102; bun 14; creat 0.78; k+ 3.2; na++ 141; ca 9.2; GFR>60; liver normal albumin 2.9; vit B 12: 316; folate 8.7; iron 92; tibc 219; ferritin 109 10-21-20: wbc 8.0; hgb 10.2; hct 33.2; mcv 96.0 plt 280   NO NEW LABS.   Review of Systems  Constitutional:  Negative for malaise/fatigue.  Respiratory:  Negative for cough and shortness of breath.   Cardiovascular:  Negative for chest  pain, palpitations and leg swelling.  Gastrointestinal:  Negative for abdominal pain, constipation and heartburn.  Musculoskeletal:  Negative for back pain, joint pain and myalgias.  Skin: Negative.   Neurological:  Negative for dizziness.  Psychiatric/Behavioral:  The patient is not nervous/anxious.     Physical Exam Constitutional:      General: She is not in acute distress.    Appearance: She is well-developed. She is not diaphoretic.  Neck:     Thyroid: No thyromegaly.  Cardiovascular:     Rate and Rhythm: Normal rate and regular rhythm.     Pulses: Normal pulses.     Heart sounds: Normal heart sounds.  Pulmonary:  Effort: Pulmonary effort is normal. No respiratory distress.     Breath sounds: Normal breath sounds.  Abdominal:     General: Bowel sounds are normal. There is no distension.     Palpations: Abdomen is soft.     Tenderness: There is no abdominal tenderness.  Musculoskeletal:     Cervical back: Neck supple.     Right lower leg: No edema.     Left lower leg: No edema.     Comments:  Left hemiplegia extremities with contractures Status post left hip hemiarthroplasty  Lymphadenopathy:     Cervical: No cervical adenopathy.  Skin:    General: Skin is warm and dry.  Neurological:     Mental Status: She is alert. Mental status is at baseline.  Psychiatric:        Mood and Affect: Mood normal.     ASSESSMENT/ PLAN:  TODAY  Hypertension associated with type 2 diabetes mellitus  Is worse Will begin norvasc 5 mg daily and will monitor her status.      Synthia Innocent NP [facility]  Contact [phone] Monday through Friday 8am- 5pm  After hours call [phone]

## 2020-11-23 ENCOUNTER — Encounter: Payer: Self-pay | Admitting: Adult Health

## 2020-11-23 ENCOUNTER — Non-Acute Institutional Stay (SKILLED_NURSING_FACILITY): Payer: Medicare Other | Admitting: Adult Health

## 2020-11-23 DIAGNOSIS — I4821 Permanent atrial fibrillation: Secondary | ICD-10-CM

## 2020-11-23 DIAGNOSIS — K922 Gastrointestinal hemorrhage, unspecified: Secondary | ICD-10-CM

## 2020-11-23 DIAGNOSIS — I152 Hypertension secondary to endocrine disorders: Secondary | ICD-10-CM

## 2020-11-23 DIAGNOSIS — I495 Sick sinus syndrome: Secondary | ICD-10-CM

## 2020-11-23 DIAGNOSIS — E1165 Type 2 diabetes mellitus with hyperglycemia: Secondary | ICD-10-CM

## 2020-11-23 DIAGNOSIS — E1159 Type 2 diabetes mellitus with other circulatory complications: Secondary | ICD-10-CM

## 2020-11-23 NOTE — Progress Notes (Signed)
Location:  [facility] Room Number: 135-P Place of Service:  SNF (31)   CODE STATUS: Full Code   Allergies  Allergen Reactions   Lactose Intolerance (Gi) Other (See Comments)    Causes Gas in patient.    Chief Complaint  Patient presents with   Medical Management of Chronic Issues            Gastrointestinal hemorrhage unspecified gastrointestinal hemorrhage type:  Hypertension associated with type 2 diabetes mellitus:    Permanent atrial fibrillation/side node dysfunction;   . Controlled type 2 diabetes mellitus with hyperglycemia without current use of insulin:    HPI:  She is a 75 year old long term resident of this facility being seen for the management of her chronic illnesses: Gastrointestinal hemorrhage unspecified gastrointestinal hemorrhage type:  Hypertension associated with type 2 diabetes mellitus:    Permanent atrial fibrillation/side node dysfunction;   . Controlled type 2 diabetes mellitus with hyperglycemia without current use of insulin:. There are no reports of uncontrolled pain. No changes in appetite; no changes in stool. No reports of insomnia.   Past Medical History:  Diagnosis Date   Atrial fibrillation [facility])    Cerebrovascular accident (CVA) with involvement of left side of body (HCC) 09/20/2018   CVA (cerebral vascular accident) (HCC)    High cholesterol    Hypertension    Seizures (HCC)    Type 2 diabetes mellitus (HCC)     Past Surgical History:  Procedure Laterality Date   ABDOMINAL HYSTERECTOMY     COLONOSCOPY WITH PROPOFOL N/A 10/20/2020   Procedure: COLONOSCOPY WITH PROPOFOL;  Surgeon: Malissa Hippo, MD;  [facility];  Service: Endoscopy;  Laterality: N/A;   ESOPHAGOGASTRODUODENOSCOPY (EGD) WITH PROPOFOL N/A 10/20/2020   Procedure: ESOPHAGOGASTRODUODENOSCOPY (EGD) WITH PROPOFOL;  Surgeon: Malissa Hippo, MD;  [facility];  Service: Endoscopy;  Laterality: N/A;   HIP ARTHROPLASTY Left 10/10/2020    Procedure: ARTHROPLASTY BIPOLAR HIP (HEMIARTHROPLASTY);  Surgeon: Oliver Barre, MD;  Location: [facility] ORS;  Service: Orthopedics;  Laterality: Left;   PACEMAKER IMPLANT N/A 08/22/2020   Procedure: PACEMAKER IMPLANT;  Surgeon: Marinus Maw, MD;  Location: [facility] INVASIVE CV LAB;  Service: Cardiovascular;  Laterality: N/A;   TUBAL LIGATION      Social History   Socioeconomic History   Marital status: Divorced    Spouse name: Not on file   Number of children: Not on file   Years of education: Not on file   Highest education level: Not on file  Occupational History   Not on file  Tobacco Use   Smoking status: Former    Types: Cigarettes    Quit date: 04/14/1988    Years since quitting: 32.6   Smokeless tobacco: Never  Vaping Use   Vaping Use: Never used  Substance and Sexual Activity   Alcohol use: Never   Drug use: Never   Sexual activity: Not on file  Other Topics Concern   Not on file  Social History Narrative   Not on file   Social Determinants of Health   Financial Resource Strain: Not on file  Food Insecurity: Not on file  Transportation Needs: Not on file  Physical Activity: Not on file  Stress: Not on file  Social Connections: Not on file  Intimate Partner Violence: Not on file   Family History  Problem Relation Age of Onset   Stroke Mother    Heart attack Father    Stroke Sister  Heart attack Sister       VITAL SIGNS BP (!) 146/70   Pulse 60   Temp 97.7 F (36.5 C)   Resp 20   Ht 5\' 3"  (1.6 m)   Wt 112 lb 3.2 oz (50.9 kg)   SpO2 100%   BMI 19.88 kg/m   Outpatient Encounter Medications as of 11/23/2020  Medication Sig   acetaminophen (TYLENOL) 650 MG CR tablet Take 650 mg by mouth 3 (three) times daily.   amiodarone (PACERONE) 200 MG tablet Take 100 mg by mouth daily.   amLODipine (NORVASC) 5 MG tablet Take 5 mg by mouth daily.   apixaban (ELIQUIS) 2.5 MG TABS tablet Take by mouth 2 (two) times daily.   atorvastatin (LIPITOR) 80 MG tablet Take 1  tablet (80 mg total) by mouth daily at 6 PM.   Balsam [place]-Castor Oil (VENELEX) OINT Apply 1 application topically as directed. Apply to sacrum and coccyx qshift for prevention. Every Shift; Day, Evening, Night   bisacodyl (DULCOLAX) 10 MG suppository Place 1 suppository (10 mg total) rectally every Monday, Wednesday, and Friday.   Ensure (ENSURE) Take 237 mLs by mouth daily.   ferrous sulfate 325 (65 FE) MG EC tablet Take 1 tablet (325 mg total) by mouth daily with breakfast.   levETIRAcetam (KEPPRA) 500 MG tablet Take 500 mg by mouth 2 (two) times daily.   Lidocaine 4 % PTCH Apply 1 patch topically. apply one patch to the left hip in the AM and remove at HS.   metoprolol tartrate (LOPRESSOR) 25 MG tablet Take 0.5 tablets (12.5 mg total) by mouth 2 (two) times daily.   NON FORMULARY Diet: No added salt, ConCHO; lactose intolerant   senna-docusate (SENOKOT-S) 8.6-50 MG tablet Take 2 tablets by mouth 2 (two) times daily.   No facility-administered encounter medications on file as of 11/23/2020.     SIGNIFICANT DIAGNOSTIC EXAMS  REVIEWED PREVIOUS    10-19-20: ct of chest abdomen and pelvis:  1. Small amount of free presacral fluid, slightly increased in volume since 10/09/2020, nonspecific but of uncertain etiology. No evidence of traumatic injury to the abdominal or pelvic viscera. 2. Postsurgical changes reflecting left arthroplasty for treatment of a recent proximal femoral fracture without evidence of complication or new fracture. 3. Mild intrahepatic biliary ductal dilatation; no obstructing lesion seen. Correlate with LFTs. 4. Diverticulosis without evidence of acute diverticulitis. 5. Skin thickening over the left buttock is unchanged; correlate with physical exam. 6. 9 mm ground-glass nodule in the left lung apex. Initial follow-up with CT at 6-12 months is recommended to confirm persistence 7.  Aortic Atherosclerosis  NO NEW EXAMS.   LABS REVIEWED PREVIOUS   10-19-20: wbc 8.3; hgb  6.8; hct 23.5; mcv 100.4 plt 288; glucose 102; bun 14; creat 0.78; k+ 3.2; na++ 141; ca 9.2; GFR>60; liver normal albumin 2.9; vit B 12: 316; folate 8.7; iron 92; tibc 219; ferritin 109 10-21-20: wbc 8.0; hgb 10.2; hct 33.2; mcv 96.0 plt 280   TODAY  10-26-20; hgb 12.2; hct 40.1; glucose 101; bun 13; creat 0.80; k+ 4.5; na++ 139; ca 9.3 GFR>60 11-01-20: tsh 1.245 liver normal albumin 3.5    Review of Systems  Constitutional:  Negative for malaise/fatigue.  Respiratory:  Negative for cough and shortness of breath.   Cardiovascular:  Negative for chest pain, palpitations and leg swelling.  Gastrointestinal:  Negative for abdominal pain, constipation and heartburn.  Musculoskeletal:  Negative for back pain, joint pain and myalgias.  Skin: Negative.   Neurological:  Negative for dizziness.  Psychiatric/Behavioral:  The patient is not nervous/anxious.     Physical Exam Constitutional:      General: She is not in acute distress.    Appearance: She is well-developed. She is not diaphoretic.  Neck:     Thyroid: No thyromegaly.  Cardiovascular:     Rate and Rhythm: Normal rate and regular rhythm.     Pulses: Normal pulses.     Heart sounds: Normal heart sounds.  Pulmonary:     Effort: Pulmonary effort is normal. No respiratory distress.     Breath sounds: Normal breath sounds.  Abdominal:     General: Bowel sounds are normal. There is no distension.     Palpations: Abdomen is soft.     Tenderness: There is no abdominal tenderness.  Musculoskeletal:     Cervical back: Neck supple.     Right lower leg: No edema.     Left lower leg: No edema.     Comments:   Left hemiplegia extremities with contractures Status post left hip hemiarthroplasty  Lymphadenopathy:     Cervical: No cervical adenopathy.  Skin:    General: Skin is warm and dry.  Neurological:     Mental Status: She is alert. Mental status is at baseline.  Psychiatric:        Mood and Affect: Mood normal.     ASSESSMENT/  PLAN:  TODAY  Gastrointestinal hemorrhage unspecified gastrointestinal hemorrhage type: is status post transfusion while hospitalized hgb 12.2 will monitor no further signs of bleeding present.   2. Hypertension associated with type 2 diabetes mellitus: is stable b/p 146/70 will continue lopressor 12.5 mg twice daily   3. Permanent atrial fibrillation/side node dysfunction; heart rate is stable will continue amiodarone 100 mg daily and lopressor 12.5 mg twice daily for rate control; eliquis 2.5 mg twice daily   4. Controlled type 2 diabetes mellitus with hyperglycemia without current use of insulin: is stable will monitor is diet controlled.   PREVIOUS   5. Hyperlipidemia associated with type 2 diabetes mellitus: is stable will continue lipitor 80 mg daily   6. Chronic constipation: is stable; will continue senna s 2 tabs twice daily   7. Cerebrovascular accident (CVA) with left side of body/spastic hemiparesis: is stable will continue to monitor her status.   8. Seizure disorder: no recent reports of seizure activity; will continue keppra 500 mg twice daily   9. Closed fracture of left hip with routine healing subsequent encounter: is stable will continue tylenol 650 mg three times daily; lidoderm 4% patch to left hip   10. Acute blood anemia as cause of postoperative anemia: is stable hgb 12.2 will continue iron daily      Will check: Hgb a1c lipids; urine microalbumin; hepatitis C; screening mammogram dexa scan   Synthia Innocent NP [facility]  Contact [phone] Monday through Friday 8am- 5pm  After hours call [phone]

## 2020-11-29 ENCOUNTER — Encounter (HOSPITAL_COMMUNITY)
Admission: RE | Admit: 2020-11-29 | Discharge: 2020-11-29 | Disposition: A | Discharge status: Home / Self Care | Payer: Medicare Other | Source: Skilled Nursing Facility | Attending: Internal Medicine | Admitting: Internal Medicine

## 2020-11-29 DIAGNOSIS — E1169 Type 2 diabetes mellitus with other specified complication: Secondary | ICD-10-CM | POA: Diagnosis not present

## 2020-11-29 LAB — LIPID PANEL
Cholesterol: 144 mg/dL (ref 0–200)
HDL: 62 mg/dL (ref >40)
LDL Cholesterol: 74 mg/dL (ref 0–99)
Total CHOL/HDL Ratio: 2.3 RATIO
Triglycerides: 41 mg/dL (ref <150)
VLDL: 8 mg/dL (ref 0–40)

## 2020-11-29 LAB — HEMOGLOBIN A1C
Hgb A1c MFr Bld: 4.8 % (ref 4.8–5.6)
Mean Plasma Glucose: 91.06 mg/dL

## 2020-11-29 LAB — HEPATITIS C ANTIBODY: HCV Ab: NONREACTIVE

## 2020-11-30 LAB — MICROALBUMIN, URINE: Microalb, Ur: 30.9 ug/mL — ABNORMAL HIGH

## 2020-12-04 ENCOUNTER — Encounter: Payer: Self-pay | Admitting: Internal Medicine

## 2020-12-04 ENCOUNTER — Ambulatory Visit (INDEPENDENT_AMBULATORY_CARE_PROVIDER_SITE_OTHER): Payer: Medicare Other | Admitting: Internal Medicine

## 2020-12-04 VITALS — BP 154/76 | HR 68 | Ht 63.0 in | Wt 117.0 lb

## 2020-12-04 DIAGNOSIS — I480 Paroxysmal atrial fibrillation: Secondary | ICD-10-CM | POA: Diagnosis not present

## 2020-12-04 DIAGNOSIS — I495 Sick sinus syndrome: Secondary | ICD-10-CM

## 2020-12-04 DIAGNOSIS — I63511 Cerebral infarction due to unspecified occlusion or stenosis of right middle cerebral artery: Secondary | ICD-10-CM | POA: Diagnosis not present

## 2020-12-04 NOTE — Patient Instructions (Signed)

## 2020-12-04 NOTE — Progress Notes (Signed)
HPI Ms. Bassinger returns today for evaluation of symptomatic tach-brady syndrome. The patient is a pleasant 75 yo woman with spells of dizziness and sob. Evaluation demonstrated preserved LV function and a heart monitor demonstrated atrial fib and flutter with a RVR as well as pauses of almost 4 seconds and daytime sinus bradycardia. She has not had frank syncope but has had multiple spells where she will get dizzy. She underwent PPM insertion 3 months ago with a [employer] DDD PM inserted. In the interim she notes she has been stable with no chest pain or sob.  Allergies  Allergen Reactions   Lactose Intolerance (Gi) Other (See Comments)    Causes Gas in patient.     No current outpatient medications on file.   No current facility-administered medications for this visit.     Past Medical History:  Diagnosis Date   Atrial fibrillation [facility])    Cerebrovascular accident (CVA) with involvement of left side of body (HCC) 09/20/2018   CVA (cerebral vascular accident) (HCC)    High cholesterol    Hypertension    Seizures (HCC)    Type 2 diabetes mellitus (HCC)     ROS:   All systems reviewed and negative except as noted in the HPI.   Past Surgical History:  Procedure Laterality Date   ABDOMINAL HYSTERECTOMY     COLONOSCOPY WITH PROPOFOL N/A 10/20/2020   Procedure: COLONOSCOPY WITH PROPOFOL;  Surgeon: Malissa Hippo, MD;  [facility];  Service: Endoscopy;  Laterality: N/A;   ESOPHAGOGASTRODUODENOSCOPY (EGD) WITH PROPOFOL N/A 10/20/2020   Procedure: ESOPHAGOGASTRODUODENOSCOPY (EGD) WITH PROPOFOL;  Surgeon: Malissa Hippo, MD;  [facility];  Service: Endoscopy;  Laterality: N/A;   HIP ARTHROPLASTY Left 10/10/2020   Procedure: ARTHROPLASTY BIPOLAR HIP (HEMIARTHROPLASTY);  Surgeon: Oliver Barre, MD;  Location: [facility] ORS;  Service: Orthopedics;  Laterality: Left;   PACEMAKER IMPLANT N/A 08/22/2020   Procedure: PACEMAKER IMPLANT;  Surgeon: Marinus Maw, MD;   Location: [facility] INVASIVE CV LAB;  Service: Cardiovascular;  Laterality: N/A;   TUBAL LIGATION       Family History  Problem Relation Age of Onset   Stroke Mother    Heart attack Father    Stroke Sister    Heart attack Sister      Social History   Socioeconomic History   Marital status: Divorced    Spouse name: Not on file   Number of children: Not on file   Years of education: Not on file   Highest education level: Not on file  Occupational History   Not on file  Tobacco Use   Smoking status: Former    Types: Cigarettes    Quit date: 04/14/1988    Years since quitting: 32.6   Smokeless tobacco: Never  Vaping Use   Vaping Use: Never used  Substance and Sexual Activity   Alcohol use: Never   Drug use: Never   Sexual activity: Not on file  Other Topics Concern   Not on file  Social History Narrative   Not on file   Social Determinants of Health   Financial Resource Strain: Not on file  Food Insecurity: Not on file  Transportation Needs: Not on file  Physical Activity: Not on file  Stress: Not on file  Social Connections: Not on file  Intimate Partner Violence: Not on file     Ht 5\' 3"  (1.6 m)   Wt 117 lb (53.1 kg)   BMI 20.73  kg/m   Physical Exam:  Well appearing NAD HEENT: Unremarkable Neck:  No JVD, no thyromegally Lymphatics:  No adenopathy Back:  No CVA tenderness Lungs:  Clear with no wheezes HEART:  Regular rate rhythm, no murmurs, no rubs, no clicks Abd:  soft, positive bowel sounds, no organomegally, no rebound, no guarding Ext:  2 plus pulses, no edema, no cyanosis, no clubbing Skin:  No rashes no nodules Neuro:  CN II through XII intact, motor grossly intact except for dense left HP.   DEVICE  Normal device function.  See PaceArt for details.   Assess/Plan:  1. Symptomatic tachy brady syndrome - she had pauses of almost 5 seconds and atrial fib and flutter with a RVR.She underwent DDD PM insertion about 3 months ago. No more pauses 2.  Atrial fib - she will continue low dose amio 3. Atrial flutter - continue amio. I would not recommend ablation at this time for either the flutter or the fib. 4. Coags - she will continue eliquis.    Sharlot Gowda Theadore Blunck,MD

## 2020-12-10 ENCOUNTER — Ambulatory Visit (HOSPITAL_COMMUNITY)
Admission: RE | Admit: 2020-12-10 | Discharge: 2020-12-10 | Disposition: A | Discharge status: Home / Self Care | Payer: Medicare Other | Source: Skilled Nursing Facility | Attending: Adult Health | Admitting: Adult Health

## 2020-12-10 ENCOUNTER — Non-Acute Institutional Stay (SKILLED_NURSING_FACILITY): Payer: Medicare Other | Admitting: Adult Health

## 2020-12-10 ENCOUNTER — Encounter: Payer: Self-pay | Admitting: Adult Health

## 2020-12-10 ENCOUNTER — Inpatient Hospital Stay (HOSPITAL_COMMUNITY): Payer: Medicare Other | Attending: Adult Health

## 2020-12-10 DIAGNOSIS — Z780 Asymptomatic menopausal state: Secondary | ICD-10-CM | POA: Diagnosis not present

## 2020-12-10 DIAGNOSIS — M85851 Other specified disorders of bone density and structure, right thigh: Secondary | ICD-10-CM | POA: Insufficient documentation

## 2020-12-10 DIAGNOSIS — Z13820 Encounter for screening for osteoporosis: Secondary | ICD-10-CM | POA: Diagnosis not present

## 2020-12-10 DIAGNOSIS — L89620 Pressure ulcer of left heel, unstageable: Secondary | ICD-10-CM

## 2020-12-10 DIAGNOSIS — Z1231 Encounter for screening mammogram for malignant neoplasm of breast: Secondary | ICD-10-CM | POA: Diagnosis not present

## 2020-12-10 NOTE — Progress Notes (Signed)
Location:  [facility] Room Number: 135 Place of Service:  SNF (31)   CODE STATUS: full code   Allergies  Allergen Reactions   Lactose Intolerance (Gi) Other (See Comments)    Causes Gas in patient.    Chief Complaint  Patient presents with   Acute Visit    Wound management     HPI:  She has a left heel ulceration.there are no indications of pain present. No indications of infection present. No reports of fevers. The ulceration has dry eschar present.   Past Medical History:  Diagnosis Date   Atrial fibrillation [facility])    Cerebrovascular accident (CVA) with involvement of left side of body (HCC) 09/20/2018   CVA (cerebral vascular accident) (HCC)    High cholesterol    Hypertension    Seizures (HCC)    Type 2 diabetes mellitus (HCC)     Past Surgical History:  Procedure Laterality Date   ABDOMINAL HYSTERECTOMY     COLONOSCOPY WITH PROPOFOL N/A 10/20/2020   Procedure: COLONOSCOPY WITH PROPOFOL;  Surgeon: Malissa Hippo, MD;  Location: [facility] ENDO SUITE;  Service: Endoscopy;  Laterality: N/A;   ESOPHAGOGASTRODUODENOSCOPY (EGD) WITH PROPOFOL N/A 10/20/2020   Procedure: ESOPHAGOGASTRODUODENOSCOPY (EGD) WITH PROPOFOL;  Surgeon: Malissa Hippo, MD;  Location: [facility] ENDO SUITE;  Service: Endoscopy;  Laterality: N/A;   HIP ARTHROPLASTY Left 10/10/2020   Procedure: ARTHROPLASTY BIPOLAR HIP (HEMIARTHROPLASTY);  Surgeon: Oliver Barre, MD;  Location: [facility] ORS;  Service: Orthopedics;  Laterality: Left;   PACEMAKER IMPLANT N/A 08/22/2020   Procedure: PACEMAKER IMPLANT;  Surgeon: Marinus Maw, MD;  Location: [facility] INVASIVE CV LAB;  Service: Cardiovascular;  Laterality: N/A;   TUBAL LIGATION      Social History   Socioeconomic History   Marital status: Divorced    Spouse name: Not on file   Number of children: Not on file   Years of education: Not on file   Highest education level: Not on file  Occupational History   Not on file  Tobacco Use   Smoking status:  Former    Types: Cigarettes    Quit date: 04/14/1988    Years since quitting: 32.6   Smokeless tobacco: Never  Vaping Use   Vaping Use: Never used  Substance and Sexual Activity   Alcohol use: Never   Drug use: Never   Sexual activity: Not on file  Other Topics Concern   Not on file  Social History Narrative   Not on file   Social Determinants of Health   Financial Resource Strain: Not on file  Food Insecurity: Not on file  Transportation Needs: Not on file  Physical Activity: Not on file  Stress: Not on file  Social Connections: Not on file  Intimate Partner Violence: Not on file   Family History  Problem Relation Age of Onset   Stroke Mother    Heart attack Father    Stroke Sister    Heart attack Sister       VITAL SIGNS BP 136/69   Pulse 78   Temp (!) 97.5 F (36.4 C)   Ht 5\' 3"  (1.6 m)   Wt 112 lb 12.8 oz (51.2 kg)   BMI 19.98 kg/m   Outpatient Encounter Medications as of 12/10/2020  Medication Sig   acetaminophen (TYLENOL) 650 MG CR tablet Take 650 mg by mouth 3 (three) times daily.   amiodarone (PACERONE) 200 MG tablet Take 100 mg by mouth daily.   amLODipine (NORVASC) 5  MG tablet Take 5 mg by mouth daily.   apixaban (ELIQUIS) 2.5 MG TABS tablet Take by mouth 2 (two) times daily.   atorvastatin (LIPITOR) 80 MG tablet Take 1 tablet (80 mg total) by mouth daily at 6 PM.   Balsam [place]-Castor Oil (VENELEX) OINT Apply 1 application topically as directed. Apply to sacrum and coccyx qshift for prevention. Every Shift; Day, Evening, Night   bisacodyl (DULCOLAX) 10 MG suppository Place 1 suppository (10 mg total) rectally every Monday, Wednesday, and Friday.   Ensure (ENSURE) Take 237 mLs by mouth daily.   ferrous sulfate 325 (65 FE) MG EC tablet Take 1 tablet (325 mg total) by mouth daily with breakfast.   levETIRAcetam (KEPPRA) 500 MG tablet Take 500 mg by mouth 2 (two) times daily.   Lidocaine 4 % PTCH Apply 1 patch topically. apply one patch to the left hip in  the AM and remove at HS.   metoprolol tartrate (LOPRESSOR) 25 MG tablet Take 0.5 tablets (12.5 mg total) by mouth 2 (two) times daily.   NON FORMULARY Diet: No added salt, ConCHO; lactose intolerant   senna-docusate (SENOKOT-S) 8.6-50 MG tablet Take 2 tablets by mouth 2 (two) times daily.   No facility-administered encounter medications on file as of 12/10/2020.     SIGNIFICANT DIAGNOSTIC EXAMS  REVIEWED PREVIOUS    10-19-20: ct of chest abdomen and pelvis:  1. Small amount of free presacral fluid, slightly increased in volume since 10/09/2020, nonspecific but of uncertain etiology. No evidence of traumatic injury to the abdominal or pelvic viscera. 2. Postsurgical changes reflecting left arthroplasty for treatment of a recent proximal femoral fracture without evidence of complication or new fracture. 3. Mild intrahepatic biliary ductal dilatation; no obstructing lesion seen. Correlate with LFTs. 4. Diverticulosis without evidence of acute diverticulitis. 5. Skin thickening over the left buttock is unchanged; correlate with physical exam. 6. 9 mm ground-glass nodule in the left lung apex. Initial follow-up with CT at 6-12 months is recommended to confirm persistence 7.  Aortic Atherosclerosis  NO NEW EXAMS.   LABS REVIEWED PREVIOUS   10-19-20: wbc 8.3; hgb 6.8; hct 23.5; mcv 100.4 plt 288; glucose 102; bun 14; creat 0.78; k+ 3.2; na++ 141; ca 9.2; GFR>60; liver normal albumin 2.9; vit B 12: 316; folate 8.7; iron 92; tibc 219; ferritin 109 10-21-20: wbc 8.0; hgb 10.2; hct 33.2; mcv 96.0 plt 280  10-26-20; hgb 12.2; hct 40.1; glucose 101; bun 13; creat 0.80; k+ 4.5; na++ 139; ca 9.3 GFR>60 11-01-20: tsh 1.245 liver normal albumin 3.5   NO NEW LABS.    Review of Systems  Constitutional:  Negative for malaise/fatigue.  Respiratory:  Negative for cough and shortness of breath.   Cardiovascular:  Negative for chest pain, palpitations and leg swelling.  Gastrointestinal:  Negative for abdominal  pain, constipation and heartburn.  Musculoskeletal:  Negative for back pain, joint pain and myalgias.  Skin:        Has sore   Neurological:  Negative for dizziness.  Psychiatric/Behavioral:  The patient is not nervous/anxious.    Physical Exam Constitutional:      General: She is not in acute distress.    Appearance: She is well-developed. She is not diaphoretic.  Neck:     Thyroid: No thyromegaly.  Cardiovascular:     Rate and Rhythm: Normal rate and regular rhythm.     Pulses: Normal pulses.     Heart sounds: Normal heart sounds.  Pulmonary:     Effort: Pulmonary effort is  normal. No respiratory distress.     Breath sounds: Normal breath sounds.  Abdominal:     General: Bowel sounds are normal. There is no distension.     Palpations: Abdomen is soft.     Tenderness: There is no abdominal tenderness.  Musculoskeletal:     Cervical back: Neck supple.     Right lower leg: No edema.     Left lower leg: No edema.     Comments: Left hemiplegia extremities with contractures Status post left hip hemiarthroplasty   Lymphadenopathy:     Cervical: No cervical adenopathy.  Skin:    General: Skin is warm and dry.     Comments: Unstaged left heel ulceration: 5 x 2 cm dry eschar no indications of infection present   Neurological:     Mental Status: She is alert. Mental status is at baseline.  Psychiatric:        Mood and Affect: Mood normal.      ASSESSMENT/ PLAN:  TODAY  Decubitus ulcer  left heel, unstagable: will continue Allevyn sponge as every other day will monitor     Synthia Innocent NP [facility]  Contact [phone] Monday through Friday 8am- 5pm  After hours call [phone]

## 2020-12-11 ENCOUNTER — Ambulatory Visit (INDEPENDENT_AMBULATORY_CARE_PROVIDER_SITE_OTHER): Payer: Medicare Other | Admitting: Gastroenterology

## 2020-12-12 ENCOUNTER — Ambulatory Visit (INDEPENDENT_AMBULATORY_CARE_PROVIDER_SITE_OTHER): Payer: Medicare Other | Admitting: Orthopedic Surgery

## 2020-12-12 ENCOUNTER — Encounter: Payer: Self-pay | Admitting: Orthopedic Surgery

## 2020-12-12 ENCOUNTER — Inpatient Hospital Stay (INDEPENDENT_AMBULATORY_CARE_PROVIDER_SITE_OTHER): Payer: Medicare Other

## 2020-12-12 DIAGNOSIS — S72002D Fracture of unspecified part of neck of left femur, subsequent encounter for closed fracture with routine healing: Secondary | ICD-10-CM

## 2020-12-12 NOTE — Progress Notes (Signed)
Orthopaedic Postop Note  Assessment: Rachel Fox is a 75 y.o. female s/p left hip hemiarthroplasty  DOS: 10/10/2020  Plan: Progressing well overall.  Occasional pain in left hip.  She remains in a nursing facility.  XR stable. Staples removed in hospital.  Continue with WBAT.  Follow up in 3 months.    Follow-up: Return in about 3 months (around 03/13/2021). XR at next visit: AP pelvis, L hip  Subjective:  Chief Complaint  Patient presents with   Follow-up    Postop cemented left hip hemiarthroplasty DOS 10/10/20.    History of Present Illness: Rachel Fox is a 75 y.o. female who presents following the above stated procedure.  She is now 2 months postop.  She was seen during re-admission following surgery after a GI bleed.  She has recovered without issue.  Her sensation a active motion in the left foot is at her baseline.  Occasionally, she has pain in her groin.  She is working with PT.  Review of Systems: No fevers or chills No numbness or tingling No Chest Pain No shortness of breath   Objective: There were no vitals taken for this visit.  Physical Exam:  Alert and oriented.  Seated in a wheelchair.  Tolerates gentle range of motion of her hip.  Mild pain in the groin with movement.  No pain with axial loading.  Limited active dorsiflexion of her great toe and ankle.  Sensation is at her baseline.  No tenderness over the incisions.  Incisions are healing well.   IMAGING: I personally ordered and reviewed the following images:  AP pelvis and left hip XR demonstrates a cemented hip hemiarthroplasty in excellent position.  No evidence of subsidence of the hardware.  Joint remains reduced.  No acute injuries.   Impression: Left hip hemiarthroplasty in stable position.    Oliver Barre, MD 12/12/2020 11:15 PM

## 2020-12-17 ENCOUNTER — Encounter: Payer: Self-pay | Admitting: Internal Medicine

## 2020-12-17 ENCOUNTER — Non-Acute Institutional Stay (SKILLED_NURSING_FACILITY): Payer: Medicare Other | Admitting: Internal Medicine

## 2020-12-17 DIAGNOSIS — I4821 Permanent atrial fibrillation: Secondary | ICD-10-CM

## 2020-12-17 DIAGNOSIS — N182 Chronic kidney disease, stage 2 (mild): Secondary | ICD-10-CM | POA: Insufficient documentation

## 2020-12-17 DIAGNOSIS — E1169 Type 2 diabetes mellitus with other specified complication: Secondary | ICD-10-CM

## 2020-12-17 DIAGNOSIS — E1165 Type 2 diabetes mellitus with hyperglycemia: Secondary | ICD-10-CM | POA: Diagnosis not present

## 2020-12-17 DIAGNOSIS — D62 Acute posthemorrhagic anemia: Secondary | ICD-10-CM

## 2020-12-17 DIAGNOSIS — E785 Hyperlipidemia, unspecified: Secondary | ICD-10-CM

## 2020-12-17 NOTE — Assessment & Plan Note (Signed)
Current creatinine 0.8 / GFR >60  ; CKD Stage 2 Medication List reviewed. No nephrotoxic agents identified.

## 2020-12-17 NOTE — Assessment & Plan Note (Signed)
Current A1c: 4.8%, NON diabetic A1c goal : < 8% No hypoglycemia No change indicated

## 2020-12-17 NOTE — Assessment & Plan Note (Addendum)
TSH therapeutic Paced rhythm today

## 2020-12-17 NOTE — Progress Notes (Signed)
   NURSING HOME LOCATION:[facility] ROOM NUMBER:  [id number] CODE STATUS:  Full Code PCP:  Benita Stabile MD  This is a nursing facility follow up visit of chronic medical diagnoses & to document compliance with Regulation 483.30 (c) in The Long Term Care Survey Manual Phase 2 which mandates caregiver visit ( visits can alternate among physician, PA or NP as per statutes) within 10 days of 30 days / 60 days/ 90 days post admission to SNF date    Interim medical record and care since last SNF visit was updated with review of diagnostic studies and change in clinical status since last visit were documented.  HPI: The patient does want to go home; realistically, she may be a permanent resident facility due to CVA sequelae & family dynamics  She has medical diagnoses of PAF, essential hypertension, dyslipidemia, history of CVA, diabetes with vascular complications, and history of seizures. Surgeries and procedures include abdominal hysterectomy and pacemaker implant.  Review of systems: She is alert oriented.  Her greatest concern  his when she can go home.  Her only active symptoms are those related to overactive bladder.  Constitutional: No fever, significant weight change, fatigue  Eyes: No redness, discharge, pain, vision change ENT/mouth: No nasal congestion,  purulent discharge, earache, change in hearing, sore throat  Cardiovascular: No chest pain, palpitations, paroxysmal nocturnal dyspnea, claudication, edema  Respiratory: No cough, sputum production, hemoptysis, DOE, significant snoring, apnea   Gastrointestinal: No heartburn, dysphagia, abdominal pain, nausea /vomiting, rectal bleeding, melena, change in bowels Genitourinary: No  hematuria, pyuria Musculoskeletal: No joint stiffness, joint swelling, pain Dermatologic: No rash, pruritus, change in appearance of skin Neurologic: No dizziness, headache, syncope, seizures Psychiatric: No significant anxiety, depression,  insomnia, anorexia Endocrine: No change in hair/skin/nails, excessive thirst, excessive hunger, excessive urination  Hematologic/lymphatic: No significant bruising, lymphadenopathy, abnormal bleeding Allergy/immunology: No itchy/watery eyes, significant sneezing, urticaria, angioedema  Physical exam:  Pertinent or positive findings: She has a dark pigmented nevus over the left chin.  Heart rate is slow but regular.  Breath sounds are decreased.  She has minimal trace edema at the sock line.  Pedal pulses are palpable but decreased.  The left upper extremity is flexed across the chest and exhibits no range of motion.  Left lower extremity is weak but not to the degree of the LUE.  Strength is good in the right upper and right lower extremities.  General appearance: Adequately nourished; no acute distress, increased work of breathing is present.   Lymphatic: No lymphadenopathy about the head, neck, axilla. Eyes: No conjunctival inflammation or lid edema is present. There is no scleral icterus. Ears:  External ear exam shows no significant lesions or deformities.   Nose:  External nasal examination shows no deformity or inflammation. Nasal mucosa are pink and moist without lesions, exudates Oral exam:  Lips and gums are healthy appearing. There is no oropharyngeal erythema or exudate. Neck:  No thyromegaly, masses, tenderness noted.    Heart:  No gallop, murmur, click, rub .  Lungs:  without wheezes, rhonchi, rales, rubs. Abdomen: Bowel sounds are normal. Abdomen is soft and nontender with no organomegaly, hernias, masses. GU: Deferred  Extremities:  No cyanosis, clubbing  Neurologic exam :Balance, Rhomberg, finger to nose testing could not be completed due to clinical state Skin: Warm & dry w/o tenting. No significant rash.  See summary under each active problem in the Problem List with associated updated therapeutic plan

## 2020-12-17 NOTE — Assessment & Plan Note (Signed)
Current H/H 12.2/40.1;prior H/H 10.2/33.2 Anemia resolved  No bleeding dyscrasias reported by Staff Monitor CBC

## 2020-12-17 NOTE — Assessment & Plan Note (Signed)
Because of PMH  ( CVA) & co-morbidities ( HTN, DM) ideal LDL goal is < 70. Current LDL adequately controlled @ 74

## 2020-12-17 NOTE — Patient Instructions (Signed)
See assessment and plan under each diagnosis in the problem list and acutely for this visit 

## 2020-12-18 DIAGNOSIS — I63511 Cerebral infarction due to unspecified occlusion or stenosis of right middle cerebral artery: Secondary | ICD-10-CM | POA: Diagnosis not present

## 2020-12-18 DIAGNOSIS — M6281 Muscle weakness (generalized): Secondary | ICD-10-CM | POA: Diagnosis not present

## 2020-12-18 DIAGNOSIS — Z9181 History of falling: Secondary | ICD-10-CM | POA: Diagnosis not present

## 2020-12-18 DIAGNOSIS — R279 Unspecified lack of coordination: Secondary | ICD-10-CM | POA: Diagnosis not present

## 2020-12-19 DIAGNOSIS — Z23 Encounter for immunization: Secondary | ICD-10-CM | POA: Diagnosis not present

## 2020-12-19 DIAGNOSIS — I63511 Cerebral infarction due to unspecified occlusion or stenosis of right middle cerebral artery: Secondary | ICD-10-CM | POA: Diagnosis not present

## 2020-12-19 DIAGNOSIS — M6281 Muscle weakness (generalized): Secondary | ICD-10-CM | POA: Diagnosis not present

## 2020-12-19 DIAGNOSIS — Z9181 History of falling: Secondary | ICD-10-CM | POA: Diagnosis not present

## 2020-12-19 DIAGNOSIS — R279 Unspecified lack of coordination: Secondary | ICD-10-CM | POA: Diagnosis not present

## 2020-12-20 DIAGNOSIS — R279 Unspecified lack of coordination: Secondary | ICD-10-CM | POA: Diagnosis not present

## 2020-12-20 DIAGNOSIS — M6281 Muscle weakness (generalized): Secondary | ICD-10-CM | POA: Diagnosis not present

## 2020-12-20 DIAGNOSIS — Z9181 History of falling: Secondary | ICD-10-CM | POA: Diagnosis not present

## 2020-12-20 DIAGNOSIS — I63511 Cerebral infarction due to unspecified occlusion or stenosis of right middle cerebral artery: Secondary | ICD-10-CM | POA: Diagnosis not present

## 2020-12-21 DIAGNOSIS — Z23 Encounter for immunization: Secondary | ICD-10-CM | POA: Diagnosis not present

## 2020-12-21 DIAGNOSIS — M6281 Muscle weakness (generalized): Secondary | ICD-10-CM | POA: Diagnosis not present

## 2020-12-21 DIAGNOSIS — I63511 Cerebral infarction due to unspecified occlusion or stenosis of right middle cerebral artery: Secondary | ICD-10-CM | POA: Diagnosis not present

## 2020-12-21 DIAGNOSIS — Z9181 History of falling: Secondary | ICD-10-CM | POA: Diagnosis not present

## 2020-12-21 DIAGNOSIS — R279 Unspecified lack of coordination: Secondary | ICD-10-CM | POA: Diagnosis not present

## 2020-12-23 DIAGNOSIS — Z9181 History of falling: Secondary | ICD-10-CM | POA: Diagnosis not present

## 2020-12-23 DIAGNOSIS — R279 Unspecified lack of coordination: Secondary | ICD-10-CM | POA: Diagnosis not present

## 2020-12-23 DIAGNOSIS — I63511 Cerebral infarction due to unspecified occlusion or stenosis of right middle cerebral artery: Secondary | ICD-10-CM | POA: Diagnosis not present

## 2020-12-23 DIAGNOSIS — M6281 Muscle weakness (generalized): Secondary | ICD-10-CM | POA: Diagnosis not present

## 2020-12-24 DIAGNOSIS — Z9181 History of falling: Secondary | ICD-10-CM | POA: Diagnosis not present

## 2020-12-24 DIAGNOSIS — R279 Unspecified lack of coordination: Secondary | ICD-10-CM | POA: Diagnosis not present

## 2020-12-24 DIAGNOSIS — M6281 Muscle weakness (generalized): Secondary | ICD-10-CM | POA: Diagnosis not present

## 2020-12-24 DIAGNOSIS — I63511 Cerebral infarction due to unspecified occlusion or stenosis of right middle cerebral artery: Secondary | ICD-10-CM | POA: Diagnosis not present

## 2020-12-25 ENCOUNTER — Non-Acute Institutional Stay (INDEPENDENT_AMBULATORY_CARE_PROVIDER_SITE_OTHER): Payer: Medicare Other | Admitting: Adult Health

## 2020-12-25 ENCOUNTER — Encounter: Payer: Self-pay | Admitting: Adult Health

## 2020-12-25 DIAGNOSIS — I63511 Cerebral infarction due to unspecified occlusion or stenosis of right middle cerebral artery: Secondary | ICD-10-CM | POA: Diagnosis not present

## 2020-12-25 DIAGNOSIS — Z9181 History of falling: Secondary | ICD-10-CM | POA: Diagnosis not present

## 2020-12-25 DIAGNOSIS — Z0000 Encounter for general adult medical examination without abnormal findings: Secondary | ICD-10-CM

## 2020-12-25 DIAGNOSIS — R279 Unspecified lack of coordination: Secondary | ICD-10-CM | POA: Diagnosis not present

## 2020-12-25 DIAGNOSIS — M6281 Muscle weakness (generalized): Secondary | ICD-10-CM | POA: Diagnosis not present

## 2020-12-25 NOTE — Patient Instructions (Signed)
  Rachel Fox , Thank you for taking time to come for your Medicare Wellness Visit. I appreciate your ongoing commitment to your health goals. Please review the following plan we discussed and let me know if I can assist you in the future.   These are the goals we discussed:  Goals      Absence of Fall and Fall-Related Injury     Evidence-based guidance:  Assess fall risk using a validated tool when available. Consider balance and gait impairment, muscle weakness, diminished vision or hearing, environmental hazards, presence of urinary or bowel urgency and/or incontinence.  Communicate fall injury risk to interprofessional healthcare team.  Develop a fall prevention plan with the patient and family.  Promote use of personal vision and auditory aids.  Promote reorientation, appropriate sensory stimulation, and routines to decrease risk of fall when changes in mental status are present.  Assess assistance level required for safe and effective self-care; consider referral for home care.  Encourage physical activity, such as performance of self-care at highest level of ability, strength and balance exercise program, and provision of appropriate assistive devices; refer to rehabilitation therapy.  Refer to community-based fall prevention program where available.  If fall occurs, determine the cause and revise fall injury prevention plan.  Regularly review medication contribution to fall risk; consider risk related to polypharmacy and age.  Refer to pharmacist for consultation when concerns about medications are revealed.  Balance adequate pain management with potential for oversedation.  Provide guidance related to environmental modifications.  Consider supplementation with Vitamin D.   Notes:      DIET - INCREASE WATER INTAKE     General - Client will not be readmitted within 30 days ([facility])        This is a list of the screening recommended for you and due dates:  Health Maintenance  Topic  Date Due   Complete foot exam   Never done   Eye exam for diabetics  Never done   Tetanus Vaccine  Never done   Zoster (Shingles) Vaccine (1 of 2) Never done   Flu Shot  10/15/2020   COVID-19 Vaccine (5 - Booster for Moderna series) 03/03/2021   Hemoglobin A1C  05/29/2021   Urine Protein Check  11/29/2021   Mammogram  12/11/2022   Colon Cancer Screening  10/21/2030   DEXA scan (bone density measurement)  Completed   Hepatitis C Screening: USPSTF Recommendation to screen - Ages 57-79 yo.  Completed   HPV Vaccine  Aged Out

## 2020-12-25 NOTE — Progress Notes (Signed)
Subjective:   Rachel Fox is a 75 y.o. female who presents for Medicare Annual (Subsequent) preventive examination.  Review of Systems    Review of Systems  Constitutional:  Negative for malaise/fatigue.  Respiratory:  Negative for cough and shortness of breath.   Cardiovascular:  Negative for chest pain, palpitations and leg swelling.  Gastrointestinal:  Negative for abdominal pain, constipation and heartburn.  Musculoskeletal:  Negative for back pain, joint pain and myalgias.  Skin: Negative.   Neurological:  Negative for dizziness.  Psychiatric/Behavioral:  The patient is not nervous/anxious.    Cardiac Risk Factors include: advanced age (>28men, >32 women);diabetes mellitus;sedentary lifestyle     Objective:    Today's Vitals   12/25/20 1113  BP: (!) 144/74  Pulse: 65  Temp: 97.9 F (36.6 C)  Weight: 114 lb 6.4 oz (51.9 kg)  Height: 5\' 3"  (1.6 m)   Body mass index is 20.27 kg/m.  Advanced Directives 11/23/2020 11/20/2020 11/14/2020 10/22/2020 10/20/2020 10/19/2020 10/18/2020  Does Patient Have a Medical Advance Directive? No No No No No No No  Does patient want to make changes to medical advance directive? No - Patient declined No - Patient declined - - - - -  Would patient like information on creating a medical advance directive? - - - No - Patient declined No - Patient declined No - Patient declined No - Patient declined    Current Medications (verified) Outpatient Encounter Medications as of 12/25/2020  Medication Sig   acetaminophen (TYLENOL) 650 MG CR tablet Take 650 mg by mouth 3 (three) times daily.   amiodarone (PACERONE) 200 MG tablet Take 100 mg by mouth daily.   amLODipine (NORVASC) 5 MG tablet Take 5 mg by mouth daily.   apixaban (ELIQUIS) 2.5 MG TABS tablet Take by mouth 2 (two) times daily.   atorvastatin (LIPITOR) 80 MG tablet Take 1 tablet (80 mg total) by mouth daily at 6 PM.   Balsam [place]-Castor Oil (VENELEX) OINT Apply 1 application topically as  directed. Apply to sacrum and coccyx qshift for prevention. Every Shift; Day, Evening, Night   bisacodyl (DULCOLAX) 10 MG suppository Place 1 suppository (10 mg total) rectally every Monday, Wednesday, and Friday.   Ensure (ENSURE) Take 237 mLs by mouth daily.   ferrous sulfate 325 (65 FE) MG EC tablet Take 1 tablet (325 mg total) by mouth daily with breakfast.   levETIRAcetam (KEPPRA) 500 MG tablet Take 500 mg by mouth 2 (two) times daily.   Lidocaine 4 % PTCH Apply 1 patch topically. apply one patch to the left hip in the AM and remove at HS.   metoprolol tartrate (LOPRESSOR) 25 MG tablet Take 0.5 tablets (12.5 mg total) by mouth 2 (two) times daily.   NON FORMULARY Diet: No added salt, ConCHO; lactose intolerant   senna-docusate (SENOKOT-S) 8.6-50 MG tablet Take 2 tablets by mouth 2 (two) times daily.   No facility-administered encounter medications on file as of 12/25/2020.    Allergies (verified) Lactose intolerance (gi)   History: Past Medical History:  Diagnosis Date   Atrial fibrillation (HCC)    Cerebrovascular accident (CVA) with involvement of left side of body (HCC) 09/20/2018   CVA (cerebral vascular accident) (HCC)    High cholesterol    Hypertension    Seizures (HCC)    Type 2 diabetes mellitus (HCC)    Past Surgical History:  Procedure Laterality Date   ABDOMINAL HYSTERECTOMY     COLONOSCOPY WITH PROPOFOL N/A 10/20/2020   Procedure: COLONOSCOPY WITH  PROPOFOL;  Surgeon: Malissa Hippo, MD;  [facility];  Service: Endoscopy;  Laterality: N/A;   ESOPHAGOGASTRODUODENOSCOPY (EGD) WITH PROPOFOL N/A 10/20/2020   Procedure: ESOPHAGOGASTRODUODENOSCOPY (EGD) WITH PROPOFOL;  Surgeon: Malissa Hippo, MD;  [facility];  Service: Endoscopy;  Laterality: N/A;   HIP ARTHROPLASTY Left 10/10/2020   Procedure: ARTHROPLASTY BIPOLAR HIP (HEMIARTHROPLASTY);  Surgeon: Oliver Barre, MD;  Location: [facility];  Service: Orthopedics;  Laterality: Left;   PACEMAKER IMPLANT  N/A 08/22/2020   Procedure: PACEMAKER IMPLANT;  Surgeon: Marinus Maw, MD;  [facility];  Service: Cardiovascular;  Laterality: N/A;   TUBAL LIGATION     Family History  Problem Relation Age of Onset   Stroke Mother    Heart attack Father    Stroke Sister    Heart attack Sister    Social History   Socioeconomic History   Marital status: Divorced    Spouse name: Not on file   Number of children: Not on file   Years of education: Not on file   Highest education level: Not on file  Occupational History   Not on file  Tobacco Use   Smoking status: Former    Types: Cigarettes    Quit date: 04/14/1988    Years since quitting: 32.7   Smokeless tobacco: Never  Vaping Use   Vaping Use: Never used  Substance and Sexual Activity   Alcohol use: Never   Drug use: Never   Sexual activity: Not on file  Other Topics Concern   Not on file  Social History Narrative   Not on file   Social Determinants of Health   Financial Resource Strain: Not on file  Food Insecurity: Not on file  Transportation Needs: Not on file  Physical Activity: Not on file  Stress: Not on file  Social Connections: Not on file    Tobacco Counseling Counseling given: Not Answered   Clinical Intake:  Pre-visit preparation completed: Yes  Pain : No/denies pain Faces Pain Scale: No hurt  Faces Pain Scale: No hurt  BMI - recorded: 20.27 Nutritional Status: BMI of 19-24  Normal Nutritional Risks: Unintentional weight loss, Failure to thrive Diabetes: Yes CBG done?: Yes (per facility) CBG resulted in Enter/ Edit results?: Yes (per [facility]) Did pt. bring in CBG monitor from home?: No  How often do you need to have someone help you when you read instructions, pamphlets, or other written materials from your doctor or pharmacy?: 5 - Always  Diabetic?yes   Interpreter Needed?: No      Activities of Daily Living In your present state of health, do you have any difficulty performing  the following activities: 12/25/2020 10/19/2020  Hearing? N N  Vision? Y Y  Difficulty concentrating or making decisions? Y N  Walking or climbing stairs? Y Y  Dressing or bathing? Y Y  Doing errands, shopping? Malvin Johns  Preparing Food and eating ? Y -  Using the Toilet? Y -  In the past six months, have you accidently leaked urine? Y -  Do you have problems with loss of bowel control? Y -  Managing your Medications? Y -  Managing your Finances? Y -  Some recent data might be hidden    Patient Care Team: Benita Stabile, MD as PCP - General (Internal Medicine) Wyline Mood Dorothe Pea, MD as PCP - Cardiology (Cardiology) Marinus Maw, MD as PCP - Electrophysiology (Cardiology)  Indicate any recent Medical Services you may have  received from other than [facility] providers in the past year (date may be approximate).     Assessment:   This is a routine wellness examination for [place].  Hearing/Vision screen No results found.  Dietary issues and exercise activities discussed: Current Exercise Habits: The patient does not participate in regular exercise at present, Exercise limited by: orthopedic condition(s)   Goals Addressed             This Visit's Progress    Absence of Fall and Fall-Related Injury       Evidence-based guidance:  Assess fall risk using a validated tool when available. Consider balance and gait impairment, muscle weakness, diminished vision or hearing, environmental hazards, presence of urinary or bowel urgency and/or incontinence.  Communicate fall injury risk to interprofessional healthcare team.  Develop a fall prevention plan with the patient and family.  Promote use of personal vision and auditory aids.  Promote reorientation, appropriate sensory stimulation, and routines to decrease risk of fall when changes in mental status are present.  Assess assistance level required for safe and effective self-care; consider referral for home care.  Encourage physical  activity, such as performance of self-care at highest level of ability, strength and balance exercise program, and provision of appropriate assistive devices; refer to rehabilitation therapy.  Refer to community-based fall prevention program where available.  If fall occurs, determine the cause and revise fall injury prevention plan.  Regularly review medication contribution to fall risk; consider risk related to polypharmacy and age.  Refer to pharmacist for consultation when concerns about medications are revealed.  Balance adequate pain management with potential for oversedation.  Provide guidance related to environmental modifications.  Consider supplementation with Vitamin D.   Notes:      DIET - INCREASE WATER INTAKE       General - Client will not be readmitted within 30 days ([facility])         Depression Screen PHQ 2/9 Scores 12/25/2020  PHQ - 2 Score 0    Fall Risk Fall Risk  12/25/2020 10/18/2018 10/16/2017 11/16/2015  Falls in the past year? 1 (No Data) No No  Comment - Emmi Telephone Survey: data to providers prior to load Emmi Telephone Survey: data to providers prior to load Emmi Telephone Survey: data to providers prior to load  Number falls in past yr: 0 (No Data) - -  Comment - Emmi Telephone Survey Actual Response =  - -  Injury with Fall? 1 - - -  Risk for fall due to : History of fall(s);Impaired balance/gait;Impaired mobility - - -  Follow up Falls evaluation completed - - -    FALL RISK PREVENTION PERTAINING TO THE HOME:  Any stairs in or around the home? No  If so, are there any without handrails? Yes  Home free of loose throw rugs in walkways, pet beds, electrical cords, etc? Yes  Adequate lighting in your home to reduce risk of falls? Yes   ASSISTIVE DEVICES UTILIZED TO PREVENT FALLS:  Life alert? No  Use of a cane, walker or w/c? Yes  Grab bars in the bathroom? Yes  Shower chair or bench in shower? Yes  Elevated toilet seat or a handicapped toilet? Yes    TIMED UP AND GO:  Was the test performed? No .  Length of time to ambulate nonambulatory  Gait unsteady with use of assistive device, provider informed and education provided.   Cognitive Function: MMSE - Mini Mental State Exam 12/25/2020  Orientation to time 3  Orientation to Place 5  Registration 3  Attention/ Calculation 4  Recall 2  Language- name 2 objects 2  Language- repeat 1  Language- follow 3 step command 3  Language- read & follow direction 0  Write a sentence 0  Copy design 0  Total score 23     6CIT Screen 12/25/2020  What Year? 0 points  What month? 0 points  What time? 3 points  Count back from 20 4 points  Months in reverse 4 points  Repeat phrase 6 points  Total Score 17    Immunizations Immunization History  Administered Date(s) Administered   Influenza-Unspecified 11/15/2017, 12/21/2019   Moderna SARS-COV2 Booster Vaccination 04/12/2020   Moderna Sars-Covid-2 Vaccination 06/03/2019, 07/01/2019, 11/01/2020    TDAP status: Up to date  Flu Vaccine status: Up to date  Pneumococcal vaccine status: Up to date  Covid-19 vaccine status: Completed vaccines  Qualifies for Shingles Vaccine? Yes   Zostavax completed Yes   Shingrix Completed?: Yes  Screening Tests Health Maintenance  Topic Date Due   FOOT EXAM  Never done   OPHTHALMOLOGY EXAM  Never done   TETANUS/TDAP  Never done   Zoster Vaccines- Shingrix (1 of 2) Never done   INFLUENZA VACCINE  10/15/2020   COVID-19 Vaccine (5 - Booster for Moderna series) 03/03/2021   HEMOGLOBIN A1C  05/29/2021   URINE MICROALBUMIN  11/29/2021   MAMMOGRAM  12/11/2022   COLONOSCOPY (Pts 45-26yrs Insurance coverage will need to be confirmed)  10/21/2030   DEXA SCAN  Completed   Hepatitis C Screening  Completed   HPV VACCINES  Aged Out    Health Maintenance  Health Maintenance Due  Topic Date Due   FOOT EXAM  Never done   OPHTHALMOLOGY EXAM  Never done   TETANUS/TDAP  Never done   Zoster  Vaccines- Shingrix (1 of 2) Never done   INFLUENZA VACCINE  10/15/2020    Colorectal cancer screening: No longer required.   Mammogram status: No longer required due to age .   Lung Cancer Screening: (Low Dose CT Chest recommended if Age 67-80 years, 30 pack-year currently smoking OR have quit w/in 15years.) does not qualify.   Lung Cancer Screening Referral: n/a  Additional Screening:  Hepatitis C Screening: does not qualify; Completed   Vision Screening: Recommended annual ophthalmology exams for early detection of glaucoma and other disorders of the eye. Is the patient up to date with their annual eye exam?  Yes  Who is the provider or what is the name of the office in which the patient attends annual eye exams?  If pt is not established with a provider, would they like to be referred to a provider to establish care? No .   Dental Screening: Recommended annual dental exams for proper oral hygiene  Community Resource Referral / Chronic Care Management: CRR required this visit?  No   CCM required this visit?  No      Plan:     I have personally reviewed and noted the following in the patient's chart:   Medical and social history Use of alcohol, tobacco or illicit drugs  Current medications and supplements including opioid prescriptions.  Functional ability and status Nutritional status Physical activity Advanced directives List of other physicians Hospitalizations, surgeries, and ER visits in previous 12 months Vitals Screenings to include cognitive, depression, and falls Referrals and appointments  In addition, I have reviewed and discussed with patient certain preventive protocols, quality metrics, and best practice recommendations. A written personalized  care plan for preventive services as well as general preventive health recommendations were provided to patient.     Sharee Holster, NP   12/25/2020   Nurse Notes:

## 2020-12-26 DIAGNOSIS — I63511 Cerebral infarction due to unspecified occlusion or stenosis of right middle cerebral artery: Secondary | ICD-10-CM | POA: Diagnosis not present

## 2020-12-26 DIAGNOSIS — M6281 Muscle weakness (generalized): Secondary | ICD-10-CM | POA: Diagnosis not present

## 2020-12-26 DIAGNOSIS — R279 Unspecified lack of coordination: Secondary | ICD-10-CM | POA: Diagnosis not present

## 2020-12-26 DIAGNOSIS — Z9181 History of falling: Secondary | ICD-10-CM | POA: Diagnosis not present

## 2020-12-27 DIAGNOSIS — R279 Unspecified lack of coordination: Secondary | ICD-10-CM | POA: Diagnosis not present

## 2020-12-27 DIAGNOSIS — M6281 Muscle weakness (generalized): Secondary | ICD-10-CM | POA: Diagnosis not present

## 2020-12-27 DIAGNOSIS — Z9181 History of falling: Secondary | ICD-10-CM | POA: Diagnosis not present

## 2020-12-27 DIAGNOSIS — I63511 Cerebral infarction due to unspecified occlusion or stenosis of right middle cerebral artery: Secondary | ICD-10-CM | POA: Diagnosis not present

## 2020-12-31 DIAGNOSIS — Z9181 History of falling: Secondary | ICD-10-CM | POA: Diagnosis not present

## 2020-12-31 DIAGNOSIS — M6281 Muscle weakness (generalized): Secondary | ICD-10-CM | POA: Diagnosis not present

## 2020-12-31 DIAGNOSIS — R279 Unspecified lack of coordination: Secondary | ICD-10-CM | POA: Diagnosis not present

## 2020-12-31 DIAGNOSIS — I63511 Cerebral infarction due to unspecified occlusion or stenosis of right middle cerebral artery: Secondary | ICD-10-CM | POA: Diagnosis not present

## 2021-01-01 DIAGNOSIS — Z9181 History of falling: Secondary | ICD-10-CM | POA: Diagnosis not present

## 2021-01-01 DIAGNOSIS — I63511 Cerebral infarction due to unspecified occlusion or stenosis of right middle cerebral artery: Secondary | ICD-10-CM | POA: Diagnosis not present

## 2021-01-01 DIAGNOSIS — M6281 Muscle weakness (generalized): Secondary | ICD-10-CM | POA: Diagnosis not present

## 2021-01-01 DIAGNOSIS — R279 Unspecified lack of coordination: Secondary | ICD-10-CM | POA: Diagnosis not present

## 2021-01-02 DIAGNOSIS — R279 Unspecified lack of coordination: Secondary | ICD-10-CM | POA: Diagnosis not present

## 2021-01-02 DIAGNOSIS — Z9181 History of falling: Secondary | ICD-10-CM | POA: Diagnosis not present

## 2021-01-02 DIAGNOSIS — I63511 Cerebral infarction due to unspecified occlusion or stenosis of right middle cerebral artery: Secondary | ICD-10-CM | POA: Diagnosis not present

## 2021-01-02 DIAGNOSIS — M6281 Muscle weakness (generalized): Secondary | ICD-10-CM | POA: Diagnosis not present

## 2021-01-03 DIAGNOSIS — R279 Unspecified lack of coordination: Secondary | ICD-10-CM | POA: Diagnosis not present

## 2021-01-03 DIAGNOSIS — Z9181 History of falling: Secondary | ICD-10-CM | POA: Diagnosis not present

## 2021-01-03 DIAGNOSIS — I63511 Cerebral infarction due to unspecified occlusion or stenosis of right middle cerebral artery: Secondary | ICD-10-CM | POA: Diagnosis not present

## 2021-01-03 DIAGNOSIS — M6281 Muscle weakness (generalized): Secondary | ICD-10-CM | POA: Diagnosis not present

## 2021-01-04 DIAGNOSIS — R279 Unspecified lack of coordination: Secondary | ICD-10-CM | POA: Diagnosis not present

## 2021-01-04 DIAGNOSIS — Z9181 History of falling: Secondary | ICD-10-CM | POA: Diagnosis not present

## 2021-01-04 DIAGNOSIS — M6281 Muscle weakness (generalized): Secondary | ICD-10-CM | POA: Diagnosis not present

## 2021-01-04 DIAGNOSIS — I63511 Cerebral infarction due to unspecified occlusion or stenosis of right middle cerebral artery: Secondary | ICD-10-CM | POA: Diagnosis not present

## 2021-01-08 DIAGNOSIS — R279 Unspecified lack of coordination: Secondary | ICD-10-CM | POA: Diagnosis not present

## 2021-01-08 DIAGNOSIS — M6281 Muscle weakness (generalized): Secondary | ICD-10-CM | POA: Diagnosis not present

## 2021-01-08 DIAGNOSIS — Z23 Encounter for immunization: Secondary | ICD-10-CM | POA: Diagnosis not present

## 2021-01-08 DIAGNOSIS — Z9181 History of falling: Secondary | ICD-10-CM | POA: Diagnosis not present

## 2021-01-08 DIAGNOSIS — I63511 Cerebral infarction due to unspecified occlusion or stenosis of right middle cerebral artery: Secondary | ICD-10-CM | POA: Diagnosis not present

## 2021-01-09 DIAGNOSIS — M6281 Muscle weakness (generalized): Secondary | ICD-10-CM | POA: Diagnosis not present

## 2021-01-09 DIAGNOSIS — Z9181 History of falling: Secondary | ICD-10-CM | POA: Diagnosis not present

## 2021-01-09 DIAGNOSIS — R279 Unspecified lack of coordination: Secondary | ICD-10-CM | POA: Diagnosis not present

## 2021-01-09 DIAGNOSIS — I63511 Cerebral infarction due to unspecified occlusion or stenosis of right middle cerebral artery: Secondary | ICD-10-CM | POA: Diagnosis not present

## 2021-01-09 DIAGNOSIS — S72002D Fracture of unspecified part of neck of left femur, subsequent encounter for closed fracture with routine healing: Secondary | ICD-10-CM | POA: Diagnosis not present

## 2021-01-09 DIAGNOSIS — Z1159 Encounter for screening for other viral diseases: Secondary | ICD-10-CM | POA: Diagnosis not present

## 2021-01-10 DIAGNOSIS — M6281 Muscle weakness (generalized): Secondary | ICD-10-CM | POA: Diagnosis not present

## 2021-01-10 DIAGNOSIS — Z9181 History of falling: Secondary | ICD-10-CM | POA: Diagnosis not present

## 2021-01-10 DIAGNOSIS — R279 Unspecified lack of coordination: Secondary | ICD-10-CM | POA: Diagnosis not present

## 2021-01-10 DIAGNOSIS — I63511 Cerebral infarction due to unspecified occlusion or stenosis of right middle cerebral artery: Secondary | ICD-10-CM | POA: Diagnosis not present

## 2021-01-11 ENCOUNTER — Encounter: Payer: Self-pay | Admitting: Adult Health

## 2021-01-11 ENCOUNTER — Non-Acute Institutional Stay (SKILLED_NURSING_FACILITY): Payer: Medicare Other | Admitting: Adult Health

## 2021-01-11 DIAGNOSIS — G8110 Spastic hemiplegia affecting unspecified side: Secondary | ICD-10-CM

## 2021-01-11 DIAGNOSIS — G40909 Epilepsy, unspecified, not intractable, without status epilepticus: Secondary | ICD-10-CM | POA: Diagnosis not present

## 2021-01-11 DIAGNOSIS — I4821 Permanent atrial fibrillation: Secondary | ICD-10-CM

## 2021-01-11 DIAGNOSIS — Z9181 History of falling: Secondary | ICD-10-CM | POA: Diagnosis not present

## 2021-01-11 DIAGNOSIS — R279 Unspecified lack of coordination: Secondary | ICD-10-CM | POA: Diagnosis not present

## 2021-01-11 DIAGNOSIS — I639 Cerebral infarction, unspecified: Secondary | ICD-10-CM | POA: Diagnosis not present

## 2021-01-11 DIAGNOSIS — M6281 Muscle weakness (generalized): Secondary | ICD-10-CM | POA: Diagnosis not present

## 2021-01-11 DIAGNOSIS — I63511 Cerebral infarction due to unspecified occlusion or stenosis of right middle cerebral artery: Secondary | ICD-10-CM | POA: Diagnosis not present

## 2021-01-11 NOTE — Progress Notes (Signed)
Location:  [facility] Room Number: 135 Place of Service:  SNF (31) Provider: Synthia Innocent, NP  CODE STATUS: FULL CODE  Allergies  Allergen Reactions   Lactose Intolerance (Gi) Other (See Comments)    Causes Gas in patient.    Chief Complaint  Patient presents with   Acute Visit    Care plan meeting    HPI:  We have come together for her care plan. BIMS 13/15 mood 2/30: not sleeping well. She requires limited to extensive assist with adls. Is nonambulatory by nursing. She is frequently incontinent of bladder and bowel. There have been no falls. Dietary: weight is 114.4 pounds normal 117-123 pounds. Will change her diet to regular and lactaid with meals. Is able to feed herself; is taking ensure daily. Therapy: PT for ambulation with ambulating 150-200 feet. Her left heel is resolving to stage 2. She continues to be followed for her chronic illnesses including:  Permanent atrial fibrillation  Spastic hemiparesis  Seizure disorder Cerebrovascular accident (CVA) with involvement of left side of body  Past Medical History:  Diagnosis Date   Atrial fibrillation (HCC)    Cerebrovascular accident (CVA) with involvement of left side of body (HCC) 09/20/2018   CVA (cerebral vascular accident) (HCC)    High cholesterol    Hypertension    Seizures (HCC)    Type 2 diabetes mellitus (HCC)     Past Surgical History:  Procedure Laterality Date   ABDOMINAL HYSTERECTOMY     COLONOSCOPY WITH PROPOFOL N/A 10/20/2020   Procedure: COLONOSCOPY WITH PROPOFOL;  Surgeon: Malissa Hippo, MD;  [facility];  Service: Endoscopy;  Laterality: N/A;   ESOPHAGOGASTRODUODENOSCOPY (EGD) WITH PROPOFOL N/A 10/20/2020   Procedure: ESOPHAGOGASTRODUODENOSCOPY (EGD) WITH PROPOFOL;  Surgeon: Malissa Hippo, MD;  [facility];  Service: Endoscopy;  Laterality: N/A;   HIP ARTHROPLASTY Left 10/10/2020   Procedure: ARTHROPLASTY BIPOLAR HIP (HEMIARTHROPLASTY);  Surgeon:  Oliver Barre, MD;  Location: [facility];  Service: Orthopedics;  Laterality: Left;   PACEMAKER IMPLANT N/A 08/22/2020   Procedure: PACEMAKER IMPLANT;  Surgeon: Marinus Maw, MD;  [facility];  Service: Cardiovascular;  Laterality: N/A;   TUBAL LIGATION      Social History   Socioeconomic History   Marital status: Divorced    Spouse name: Not on file   Number of children: Not on file   Years of education: Not on file   Highest education level: Not on file  Occupational History   Not on file  Tobacco Use   Smoking status: Former    Types: Cigarettes    Quit date: 04/14/1988    Years since quitting: 32.7   Smokeless tobacco: Never  Vaping Use   Vaping Use: Never used  Substance and Sexual Activity   Alcohol use: Never   Drug use: Never   Sexual activity: Not on file  Other Topics Concern   Not on file  Social History Narrative   Not on file   Social Determinants of Health   Financial Resource Strain: Not on file  Food Insecurity: Not on file  Transportation Needs: Not on file  Physical Activity: Not on file  Stress: Not on file  Social Connections: Not on file  Intimate Partner Violence: Not on file   Family History  Problem Relation Age of Onset   Stroke Mother    Heart attack Father    Stroke Sister    Heart attack Sister  VITAL SIGNS BP 126/66   Pulse 63   Temp 97.6 F (36.4 C)   Resp 20   Ht 5\' 3"  (1.6 m)   Wt 114 lb 6.4 oz (51.9 kg)   SpO2 100%   BMI 20.27 kg/m   Outpatient Encounter Medications as of 01/11/2021  Medication Sig   acetaminophen (TYLENOL) 650 MG CR tablet Take 650 mg by mouth 3 (three) times daily.   amiodarone (PACERONE) 200 MG tablet Take 100 mg by mouth daily.   amLODipine (NORVASC) 5 MG tablet Take 5 mg by mouth daily.   apixaban (ELIQUIS) 2.5 MG TABS tablet Take by mouth 2 (two) times daily.   atorvastatin (LIPITOR) 80 MG tablet Take 1 tablet (80 mg total) by mouth daily at 6 PM.   Balsam [place]-Castor Oil  (VENELEX) OINT Apply 1 application topically as directed. Apply to sacrum and coccyx qshift for prevention. Every Shift; Day, Evening, Night   bisacodyl (DULCOLAX) 10 MG suppository Place 1 suppository (10 mg total) rectally every Monday, Wednesday, and Friday.   Ensure (ENSURE) Take 237 mLs by mouth daily.   ferrous sulfate 325 (65 FE) MG EC tablet Take 1 tablet (325 mg total) by mouth daily with breakfast.   levETIRAcetam (KEPPRA) 500 MG tablet Take 500 mg by mouth 2 (two) times daily.   Lidocaine 4 % PTCH Apply 1 patch topically. apply one patch to the left hip in the AM and remove at HS.   metoprolol tartrate (LOPRESSOR) 25 MG tablet Take 0.5 tablets (12.5 mg total) by mouth 2 (two) times daily.   NON FORMULARY Diet: No added salt, ConCHO; lactose intolerant   senna-docusate (SENOKOT-S) 8.6-50 MG tablet Take 2 tablets by mouth 2 (two) times daily.   No facility-administered encounter medications on file as of 01/11/2021.     SIGNIFICANT DIAGNOSTIC EXAMS  REVIEWED PREVIOUS    10-19-20: ct of chest abdomen and pelvis:  1. Small amount of free presacral fluid, slightly increased in volume since 10/09/2020, nonspecific but of uncertain etiology. No evidence of traumatic injury to the abdominal or pelvic viscera. 2. Postsurgical changes reflecting left arthroplasty for treatment of a recent proximal femoral fracture without evidence of complication or new fracture. 3. Mild intrahepatic biliary ductal dilatation; no obstructing lesion seen. Correlate with LFTs. 4. Diverticulosis without evidence of acute diverticulitis. 5. Skin thickening over the left buttock is unchanged; correlate with physical exam. 6. 9 mm ground-glass nodule in the left lung apex. Initial follow-up with CT at 6-12 months is recommended to confirm persistence 7.  Aortic Atherosclerosis  NO NEW EXAMS.   LABS REVIEWED PREVIOUS   10-19-20: wbc 8.3; hgb 6.8; hct 23.5; mcv 100.4 plt 288; glucose 102; bun 14; creat 0.78; k+  3.2; na++ 141; ca 9.2; GFR>60; liver normal albumin 2.9; vit B 12: 316; folate 8.7; iron 92; tibc 219; ferritin 109 10-21-20: wbc 8.0; hgb 10.2; hct 33.2; mcv 96.0 plt 280  10-26-20; hgb 12.2; hct 40.1; glucose 101; bun 13; creat 0.80; k+ 4.5; na++ 139; ca 9.3 GFR>60 11-01-20: tsh 1.245 liver normal albumin 3.5   NO NEW LABS.    Review of Systems  Constitutional:  Negative for malaise/fatigue.  Respiratory:  Negative for cough and shortness of breath.   Cardiovascular:  Negative for chest pain, palpitations and leg swelling.  Gastrointestinal:  Negative for abdominal pain, constipation and heartburn.  Musculoskeletal:  Negative for back pain, joint pain and myalgias.  Skin: Negative.   Neurological:  Negative for dizziness.  Psychiatric/Behavioral:  The  patient is not nervous/anxious.    Physical Exam Constitutional:      General: She is not in acute distress.    Appearance: She is well-developed. She is not diaphoretic.  Neck:     Thyroid: No thyromegaly.  Cardiovascular:     Rate and Rhythm: Normal rate and regular rhythm.     Pulses: Normal pulses.     Heart sounds: Normal heart sounds.  Pulmonary:     Effort: Pulmonary effort is normal. No respiratory distress.     Breath sounds: Normal breath sounds.  Abdominal:     General: Bowel sounds are normal. There is no distension.     Palpations: Abdomen is soft.     Tenderness: There is no abdominal tenderness.  Musculoskeletal:     Cervical back: Neck supple.     Right lower leg: No edema.     Left lower leg: No edema.     Comments: Left hemiplegia extremities with contractures Status post left hip hemiarthroplasty   Lymphadenopathy:     Cervical: No cervical adenopathy.  Skin:    General: Skin is warm and dry.  Neurological:     Mental Status: She is alert. Mental status is at baseline.  Psychiatric:        Mood and Affect: Mood normal.      ASSESSMENT/ PLAN:  TODAY  Permanent atrial fibrillation Spastic  hemiparesis Seizure disorder Cerebrovascular accident (CVA) with involvement of left side of body   Will continue current medications Will continue current plan of care Will continue to monitor her status.    Time spent with patient: 40 minute: medications; diet; therapy care plan.    Synthia Innocent NP [facility]  Contact [phone] Monday through Friday 8am- 5pm  After hours call [phone]

## 2021-01-12 DIAGNOSIS — I63511 Cerebral infarction due to unspecified occlusion or stenosis of right middle cerebral artery: Secondary | ICD-10-CM | POA: Diagnosis not present

## 2021-01-12 DIAGNOSIS — Z9181 History of falling: Secondary | ICD-10-CM | POA: Diagnosis not present

## 2021-01-12 DIAGNOSIS — M6281 Muscle weakness (generalized): Secondary | ICD-10-CM | POA: Diagnosis not present

## 2021-01-12 DIAGNOSIS — R279 Unspecified lack of coordination: Secondary | ICD-10-CM | POA: Diagnosis not present

## 2021-01-14 DIAGNOSIS — I63511 Cerebral infarction due to unspecified occlusion or stenosis of right middle cerebral artery: Secondary | ICD-10-CM | POA: Diagnosis not present

## 2021-01-14 DIAGNOSIS — M6281 Muscle weakness (generalized): Secondary | ICD-10-CM | POA: Diagnosis not present

## 2021-01-14 DIAGNOSIS — R279 Unspecified lack of coordination: Secondary | ICD-10-CM | POA: Diagnosis not present

## 2021-01-14 DIAGNOSIS — Z9181 History of falling: Secondary | ICD-10-CM | POA: Diagnosis not present

## 2021-01-15 DIAGNOSIS — Z9181 History of falling: Secondary | ICD-10-CM | POA: Diagnosis not present

## 2021-01-15 DIAGNOSIS — M6281 Muscle weakness (generalized): Secondary | ICD-10-CM | POA: Diagnosis not present

## 2021-01-15 DIAGNOSIS — I63511 Cerebral infarction due to unspecified occlusion or stenosis of right middle cerebral artery: Secondary | ICD-10-CM | POA: Diagnosis not present

## 2021-01-15 DIAGNOSIS — R279 Unspecified lack of coordination: Secondary | ICD-10-CM | POA: Diagnosis not present

## 2021-01-16 DIAGNOSIS — M6281 Muscle weakness (generalized): Secondary | ICD-10-CM | POA: Diagnosis not present

## 2021-01-16 DIAGNOSIS — Z9181 History of falling: Secondary | ICD-10-CM | POA: Diagnosis not present

## 2021-01-16 DIAGNOSIS — R279 Unspecified lack of coordination: Secondary | ICD-10-CM | POA: Diagnosis not present

## 2021-01-16 DIAGNOSIS — I63511 Cerebral infarction due to unspecified occlusion or stenosis of right middle cerebral artery: Secondary | ICD-10-CM | POA: Diagnosis not present

## 2021-01-21 ENCOUNTER — Non-Acute Institutional Stay (SKILLED_NURSING_FACILITY): Payer: Medicare Other | Admitting: Adult Health

## 2021-01-21 ENCOUNTER — Encounter: Payer: Self-pay | Admitting: Adult Health

## 2021-01-21 DIAGNOSIS — E785 Hyperlipidemia, unspecified: Secondary | ICD-10-CM | POA: Diagnosis not present

## 2021-01-21 DIAGNOSIS — G8110 Spastic hemiplegia affecting unspecified side: Secondary | ICD-10-CM

## 2021-01-21 DIAGNOSIS — G40909 Epilepsy, unspecified, not intractable, without status epilepticus: Secondary | ICD-10-CM

## 2021-01-21 DIAGNOSIS — E1169 Type 2 diabetes mellitus with other specified complication: Secondary | ICD-10-CM | POA: Diagnosis not present

## 2021-01-21 DIAGNOSIS — I639 Cerebral infarction, unspecified: Secondary | ICD-10-CM | POA: Diagnosis not present

## 2021-01-21 DIAGNOSIS — K5909 Other constipation: Secondary | ICD-10-CM | POA: Diagnosis not present

## 2021-01-21 NOTE — Progress Notes (Signed)
Location:  [facility] Room Number: 135-P Place of Service:  SNF (31)   CODE STATUS: Full Code  Allergies  Allergen Reactions   Lactose Intolerance (Gi) Other (See Comments)    Causes Gas in patient.    Chief Complaint  Patient presents with   Medical Management of Chronic Issues               Hyperlipidemia associated with type 2 diabetes mellitus: Chronic constipation:  Cerebrovascular accident (CVA) with left side of body/spastic hemiparesis:  Seizure disorder:    HPI:  She is a 75 year old long term resident of this facility being seen for the management of her chronic illnesses: Hyperlipidemia associated with type 2 diabetes mellitus: Chronic constipation:  Cerebrovascular accident (CVA) with left side of body/spastic hemiparesis:  Seizure disorder: there are no reports of uncontrolled pain. There are no reports of changes in appetite no reports of anxiety or depressive thoughts.   Past Medical History:  Diagnosis Date   Atrial fibrillation [facility])    Cerebrovascular accident (CVA) with involvement of left side of body ([place]) 09/20/2018   CVA (cerebral vascular accident) ([place])    High cholesterol    Hypertension    Seizures ([place])    Type 2 diabetes mellitus ([place])     Past Surgical History:  Procedure Laterality Date   ABDOMINAL HYSTERECTOMY     COLONOSCOPY WITH PROPOFOL N/A 10/20/2020   Procedure: COLONOSCOPY WITH PROPOFOL;  Surgeon: Rogene Houston, MD;  Location: [facility] ENDO SU[facility];  Service: Endoscopy;  Laterality: N/A;   ESOPHAGOGASTRODUODENOSCOPY (EGD) WITH PROPOFOL N/A 10/20/2020   Procedure: ESOPHAGOGASTRODUODENOSCOPY (EGD) WITH PROPOFOL;  Surgeon: Rogene Houston, MD;  Location[facility];  Service: Endoscopy;  Laterality: N/A;   HIP ARTHROPLASTY Left 10/10/2020   Procedure: ARTHROPLASTY BIPOLAR HIP (HEMIARTHROPLASTY);  Surgeon: Mordecai Rasmussen, MD;  Location: [facility] ORS;  Service: Orthopedics;  Laterality: Left;   PACEMAKER IMPLANT N/A 08/22/2020    Procedure: PACEMAKER IMPLANT;  Surgeon: Evans Lance, MD;  Location: [place] CV LAB;  Service: Cardiovascular;  Laterality: N/A;   TUBAL LIGATION      Social History   Socioeconomic History   Marital status: Divorced    Spouse name: Not on file   Number of children: Not on file   Years of education: Not on file   Highest education level: Not on file  Occupational History   Not on file  Tobacco Use   Smoking status: Former    Types: Cigarettes    Quit date: 04/14/1988    Years since quitting: 32.7   Smokeless tobacco: Never  Vaping Use   Vaping Use: Never used  Substance and Sexual Activity   Alcohol use: Never   Drug use: Never   Sexual activity: Not on file  Other Topics Concern   Not on file  Social History Narrative   Not on file   Social Determinants of Health   Financial Resource Strain: Not on file  Food Insecurity: Not on file  Transportation Needs: Not on file  Physical Activity: Not on file  Stress: Not on file  Social Connections: Not on file  Intimate Partner Violence: Not on file   Family History  Problem Relation Age of Onset   Stroke Mother    Heart attack Father    Stroke Sister    Heart attack Sister       VITAL SIGNS BP 136/73   Pulse 63   Temp (!) 97.3 F (  36.3 C)   Resp 20   Ht 5\' 3"  (1.6 m)   Wt 119 lb (54 kg)   SpO2 100%   BMI 21.08 kg/m   Outpatient Encounter Medications as of 01/21/2021  Medication Sig   acetaminophen (TYLENOL) 650 MG CR tablet Take 650 mg by mouth 3 (three) times daily.   amiodarone (PACERONE) 200 MG tablet Take 100 mg by mouth daily.   amLODipine (NORVASC) 5 MG tablet Take 5 mg by mouth daily.   apixaban (ELIQUIS) 2.5 MG TABS tablet Take by mouth 2 (two) times daily.   atorvastatin (LIPITOR) 80 MG tablet Take 1 tablet (80 mg total) by mouth daily at 6 PM.   Balsam [place]-Castor Oil (VENELEX) OINT Apply 1 application topically as directed. Apply to sacrum and coccyx qshift for prevention. Every Shift;  Day, Evening, Night   bisacodyl (DULCOLAX) 10 MG suppository Place 1 suppository (10 mg total) rectally every Monday, Wednesday, and Friday.   Ensure (ENSURE) Take 237 mLs by mouth daily.   ferrous sulfate 325 (65 FE) MG EC tablet Take 1 tablet (325 mg total) by mouth daily with breakfast.   lactase (LACTAID) 3000 units tablet Take 3,000 Units by mouth as needed.   levETIRAcetam (KEPPRA) 500 MG tablet Take 500 mg by mouth 2 (two) times daily.   Lidocaine 4 % PTCH Apply 1 patch topically. apply one patch to the left hip in the AM and remove at HS.   metoprolol tartrate (LOPRESSOR) 25 MG tablet Take 0.5 tablets (12.5 mg total) by mouth 2 (two) times daily.   NON FORMULARY Diet: No added salt, ConCHO; lactose intolerant   senna-docusate (SENOKOT-S) 8.6-50 MG tablet Take 2 tablets by mouth 2 (two) times daily.   No facility-administered encounter medications on file as of 01/21/2021.     SIGNIFICANT DIAGNOSTIC EXAMS  REVIEWED PREVIOUS    10-19-20: ct of chest abdomen and pelvis:  1. Small amount of free presacral fluid, slightly increased in volume since 10/09/2020, nonspecific but of uncertain etiology. No evidence of traumatic injury to the abdominal or pelvic viscera. 2. Postsurgical changes reflecting left arthroplasty for treatment of a recent proximal femoral fracture without evidence of complication or new fracture. 3. Mild intrahepatic biliary ductal dilatation; no obstructing lesion seen. Correlate with LFTs. 4. Diverticulosis without evidence of acute diverticulitis. 5. Skin thickening over the left buttock is unchanged; correlate with physical exam. 6. 9 mm ground-glass nodule in the left lung apex. Initial follow-up with CT at 6-12 months is recommended to confirm persistence 7.  Aortic Atherosclerosis  NO NEW EXAMS.   LABS REVIEWED PREVIOUS   10-19-20: wbc 8.3; hgb 6.8; hct 23.5; mcv 100.4 plt 288; glucose 102; bun 14; creat 0.78; k+ 3.2; na++ 141; ca 9.2; GFR>60; liver normal  albumin 2.9; vit B 12: 316; folate 8.7; iron 92; tibc 219; ferritin 109 10-21-20: wbc 8.0; hgb 10.2; hct 33.2; mcv 96.0 plt 280  10-26-20; hgb 12.2; hct 40.1; glucose 101; bun 13; creat 0.80; k+ 4.5; na++ 139; ca 9.3 GFR>60 11-01-20: tsh 1.245 liver normal albumin 3.5   TODAY  11-29-20: hgb a1c 4.8; chol 144; ldl 74 trig 41; hdl 62; hepatitic C nr; micro-albumin 30.9     Review of Systems  Constitutional:  Negative for malaise/fatigue.  Respiratory:  Negative for cough and shortness of breath.   Cardiovascular:  Negative for chest pain, palpitations and leg swelling.  Gastrointestinal:  Negative for abdominal pain, constipation and heartburn.  Musculoskeletal:  Negative for back pain, joint pain  and myalgias.  Skin: Negative.   Neurological:  Negative for dizziness.  Psychiatric/Behavioral:  The patient is not nervous/anxious.    Physical Exam Constitutional:      General: She is not in acute distress.    Appearance: She is well-developed. She is not diaphoretic.  Neck:     Thyroid: No thyromegaly.  Cardiovascular:     Rate and Rhythm: Normal rate and regular rhythm.     Pulses: Normal pulses.     Heart sounds: Normal heart sounds.  Pulmonary:     Effort: Pulmonary effort is normal. No respiratory distress.     Breath sounds: Normal breath sounds.  Abdominal:     General: Bowel sounds are normal. There is no distension.     Palpations: Abdomen is soft.     Tenderness: There is no abdominal tenderness.  Musculoskeletal:     Cervical back: Neck supple.     Right lower leg: No edema.     Left lower leg: No edema.     Comments:  Left hemiplegia extremities with contractures Status post left hip hemiarthroplasty    Lymphadenopathy:     Cervical: No cervical adenopathy.  Skin:    General: Skin is warm and dry.  Neurological:     Mental Status: She is alert. Mental status is at baseline.  Psychiatric:        Mood and Affect: Mood normal.      ASSESSMENT/  PLAN:  TODAY  Hyperlipidemia associated with type 2 diabetes mellitus: is stable ldl 74; will continue lipitor 80 mg daily   2. Chronic constipation: is stable will continue senna s 2 tabs twice daily   3. Cerebrovascular accident (CVA) with left side of body/spastic hemiparesis: is stable will continue to monitor her status.   4. Seizure disorder: no recent reports of seizure activity will continue keppra 500 mg twice daily    PREVIOUS   5. Closed fracture of left hip with routine healing subsequent encounter: is stable will continue tylenol 650 mg three times daily; lidoderm 4% patch to left hip   6 Acute blood anemia as cause of postoperative anemia: is stable hgb 12.2 will continue iron daily    7. Gastrointestinal hemorrhage unspecified gastrointestinal hemorrhage type: is status post transfusion while hospitalized hgb 12.2 will monitor no further signs of bleeding present.   8. Hypertension associated with type 2 diabetes mellitus: is stable b/p 136/73 will continue lopressor 12.5 mg twice daily   9. Permanent atrial fibrillation/side node dysfunction; heart rate is stable will continue amiodarone 100 mg daily and lopressor 12.5 mg twice daily for rate control; eliquis 2.5 mg twice daily   10. Controlled type 2 diabetes mellitus with hyperglycemia without current use of insulin: is stable will monitor is diet controlled.     Synthia Innocent NP [facility]  Contact [phone] Monday through Friday 8am- 5pm  After hours call [phone]

## 2021-02-12 ENCOUNTER — Ambulatory Visit (INDEPENDENT_AMBULATORY_CARE_PROVIDER_SITE_OTHER): Payer: Medicare Other | Admitting: Orthopedic Surgery

## 2021-02-12 ENCOUNTER — Inpatient Hospital Stay: Payer: Medicare Other

## 2021-02-12 ENCOUNTER — Encounter: Payer: Self-pay | Admitting: Orthopedic Surgery

## 2021-02-12 DIAGNOSIS — S72002D Fracture of unspecified part of neck of left femur, subsequent encounter for closed fracture with routine healing: Secondary | ICD-10-CM

## 2021-02-12 DIAGNOSIS — Z96649 Presence of unspecified artificial hip joint: Secondary | ICD-10-CM

## 2021-02-12 NOTE — Progress Notes (Signed)
Orthopaedic Postop Note  Assessment: Rachel Fox is a 75 y.o. female s/p left hip hemiarthroplasty  DOS: 10/10/2020  Plan: Pain in her left hip is improving.  She does have some mild tenderness over the lateral hip, but this is also improving.  She tolerates gentle range of motion, and axial loading, without pain in her left groin.  X-rays are stable, without evidence of subsidence or hardware failure.  Overall, I am pleased with the progress she has achieved.  It sounds like as she may be discharged from the [facility] soon.  We will plan to see her back in approximately 8 months, which is around the 1 year anniversary of her surgery.   Follow-up: Return in about 8 months (around 10/12/2021). XR at next visit: AP pelvis, L hip  Subjective:  Chief Complaint  Patient presents with   fracture LT hip    DOS 10/10/20  S/p closed fracture of neck of LEFT femur    History of Present Illness: Rachel Fox is a 75 y.o. female who presents following the above stated procedure.  Surgery was approximately 4 months ago.  She continues to improve.  She has no pain in her groin.  She is working with physical therapy.  She notes some tenderness to palpation over the proximal lateral aspect of the incision.  No pain otherwise.  She remains at the [facility].  Review of Systems: No fevers or chills No numbness or tingling No Chest Pain No shortness of breath   Objective: There were no vitals taken for this visit.  Physical Exam:  Alert and oriented.  Seated in a wheelchair.  Evaluation of left hip demonstrates no swelling.  No deformity is appreciated.  Surgical incision is healing well, without surrounding erythema or drainage.  Mild tenderness to palpation over the most proximal extent of the incision.  She tolerates gentle range of motion of the hip, without groin pain.  No pain in the hip with axial loading.  Sensation to the left lower leg is at her baseline.  Toes warm and  well-perfused.   IMAGING: I personally ordered and reviewed the following images:  AP pelvis and left hip x-rays demonstrates an appropriately positioned left hip hemiarthroplasty.  No evidence of hardware failure or loosening.  Appearance of the cement remains unchanged.  The hip is reduced.  No acute injuries are noted.  Impression: Left hip hemiarthroplasty in good position, without hardware failure or loosening.   Oliver Barre, MD 02/12/2021 10:58 AM

## 2021-02-13 DIAGNOSIS — I63511 Cerebral infarction due to unspecified occlusion or stenosis of right middle cerebral artery: Secondary | ICD-10-CM | POA: Diagnosis not present

## 2021-02-13 DIAGNOSIS — R279 Unspecified lack of coordination: Secondary | ICD-10-CM | POA: Diagnosis not present

## 2021-02-13 DIAGNOSIS — M6281 Muscle weakness (generalized): Secondary | ICD-10-CM | POA: Diagnosis not present

## 2021-02-13 DIAGNOSIS — Z9181 History of falling: Secondary | ICD-10-CM | POA: Diagnosis not present

## 2021-02-19 ENCOUNTER — Non-Acute Institutional Stay (SKILLED_NURSING_FACILITY): Payer: Medicare Other | Admitting: Adult Health

## 2021-02-19 ENCOUNTER — Encounter: Payer: Self-pay | Admitting: Adult Health

## 2021-02-19 DIAGNOSIS — D62 Acute posthemorrhagic anemia: Secondary | ICD-10-CM

## 2021-02-19 DIAGNOSIS — K922 Gastrointestinal hemorrhage, unspecified: Secondary | ICD-10-CM | POA: Diagnosis not present

## 2021-02-19 DIAGNOSIS — S72002D Fracture of unspecified part of neck of left femur, subsequent encounter for closed fracture with routine healing: Secondary | ICD-10-CM | POA: Diagnosis not present

## 2021-02-19 NOTE — Progress Notes (Signed)
Location:  [facility] Room Number: 135-P Place of Service:  SNF (31)   CODE STATUS: Full Code   Allergies  Allergen Reactions   Lactose Intolerance (Gi) Other (See Comments)    Causes Gas in patient.    Chief Complaint  Patient presents with   Medical Management of Chronic Issues             Closed fracture of left hip with routine healing subsequent encounter:  Acute blood loss anemia as cause of postoperative anemia:  Gastrointestinal hemorrhage unspecified gastrointestinal hemorrhage type    HPI:  She is a 75 year old long term resident of this facility being seen for the management of her chronic illnesses:  Closed fracture of left hip with routine healing subsequent encounter:  Acute blood loss anemia as cause of postoperative anemia:  Gastrointestinal hemorrhage unspecified gastrointestinal hemorrhage type. Her hgb remains stable. There are no reports of uncontrolled pain. There are no reports of anxiety or depressive thoughts.   Past Medical History:  Diagnosis Date   Atrial fibrillation [facility])    Cerebrovascular accident (CVA) with involvement of left side of body ([facility]) 09/20/2018   CVA (cerebral vascular accident) ([facility])    High cholesterol    Hypertension    Seizures ([facility])    Type 2 diabetes mellitus ([facility])     Past Surgical History:  Procedure Laterality Date   ABDOMINAL HYSTERECTOMY     COLONOSCOPY WITH PROPOFOL N/A 10/20/2020   Procedure: COLONOSCOPY WITH PROPOFOL;  Surgeon: Malissa Hippo, MD;  [facility];  Service: Endoscopy;  Laterality: N/A;   ESOPHAGOGASTRODUODENOSCOPY (EGD) WITH PROPOFOL N/A 10/20/2020   Procedure: ESOPHAGOGASTRODUODENOSCOPY (EGD) WITH PROPOFOL;  Surgeon: Malissa Hippo, MD;  [facility];  Service: Endoscopy;  Laterality: N/A;   HIP ARTHROPLASTY Left 10/10/2020   Procedure: ARTHROPLASTY BIPOLAR HIP (HEMIARTHROPLASTY);  Surgeon: Oliver Barre, MD;  Location: [facility] ORS;  Service: Orthopedics;   Laterality: Left;   PACEMAKER IMPLANT N/A 08/22/2020   Procedure: PACEMAKER IMPLANT;  Surgeon: Marinus Maw, MD;  Location: [facility] INVASIVE CV LAB;  Service: Cardiovascular;  Laterality: N/A;   TUBAL LIGATION      Social History   Socioeconomic History   Marital status: Divorced    Spouse name: Not on file   Number of children: Not on file   Years of education: Not on file   Highest education level: Not on file  Occupational History   Not on file  Tobacco Use   Smoking status: Former    Types: Cigarettes    Quit date: 04/14/1988    Years since quitting: 32.8   Smokeless tobacco: Never  Vaping Use   Vaping Use: Never used  Substance and Sexual Activity   Alcohol use: Never   Drug use: Never   Sexual activity: Not on file  Other Topics Concern   Not on file  Social History Narrative   Not on file   Social Determinants of Health   Financial Resource Strain: Not on file  Food Insecurity: Not on file  Transportation Needs: Not on file  Physical Activity: Not on file  Stress: Not on file  Social Connections: Not on file  Intimate Partner Violence: Not on file   Family History  Problem Relation Age of Onset   Stroke Mother    Heart attack Father    Stroke Sister    Heart attack Sister       VITAL SIGNS Pulse 66  Temp (!) 97.2 F (36.2 C)   Resp 20   Ht 5\' 3"  (1.6 m)   Wt 121 lb 12.8 oz (55.2 kg)   SpO2 100%   BMI 21.58 kg/m   Outpatient Encounter Medications as of 02/19/2021  Medication Sig   acetaminophen (TYLENOL) 650 MG CR tablet Take 650 mg by mouth 3 (three) times daily.   amiodarone (PACERONE) 200 MG tablet Take 100 mg by mouth daily.   amLODipine (NORVASC) 5 MG tablet Take 5 mg by mouth daily.   apixaban (ELIQUIS) 2.5 MG TABS tablet Take by mouth 2 (two) times daily.   atorvastatin (LIPITOR) 80 MG tablet Take 1 tablet (80 mg total) by mouth daily at 6 PM.   Balsam [place]-Castor Oil (VENELEX) OINT Apply 1 application topically as directed. Apply to  sacrum and coccyx qshift for prevention. Every Shift; Day, Evening, Night   bisacodyl (DULCOLAX) 10 MG suppository Place 10 mg rectally as needed for moderate constipation.   Ensure (ENSURE) Take 237 mLs by mouth daily.   ferrous sulfate 325 (65 FE) MG EC tablet Take 1 tablet (325 mg total) by mouth daily with breakfast.   lactase (LACTAID) 3000 units tablet Take 3,000 Units by mouth as needed.   levETIRAcetam (KEPPRA) 500 MG tablet Take 500 mg by mouth 2 (two) times daily.   Lidocaine 4 % PTCH Apply 1 patch topically. apply one patch to the left hip in the AM and remove at HS.   metoprolol tartrate (LOPRESSOR) 25 MG tablet Take 0.5 tablets (12.5 mg total) by mouth 2 (two) times daily.   NON FORMULARY Diet: Regular   senna-docusate (SENOKOT-S) 8.6-50 MG tablet Take 1 tablet by mouth 2 (two) times daily. 10 am and 10 pm   [DISCONTINUED] bisacodyl (DULCOLAX) 10 MG suppository Place 1 suppository (10 mg total) rectally every Monday, Wednesday, and Friday.   [DISCONTINUED] senna-docusate (SENOKOT-S) 8.6-50 MG tablet Take 2 tablets by mouth 2 (two) times daily.   No facility-administered encounter medications on file as of 02/19/2021.     SIGNIFICANT DIAGNOSTIC EXAMS  REVIEWED PREVIOUS    10-19-20: ct of chest abdomen and pelvis:  1. Small amount of free presacral fluid, slightly increased in volume since 10/09/2020, nonspecific but of uncertain etiology. No evidence of traumatic injury to the abdominal or pelvic viscera. 2. Postsurgical changes reflecting left arthroplasty for treatment of a recent proximal femoral fracture without evidence of complication or new fracture. 3. Mild intrahepatic biliary ductal dilatation; no obstructing lesion seen. Correlate with LFTs. 4. Diverticulosis without evidence of acute diverticulitis. 5. Skin thickening over the left buttock is unchanged; correlate with physical exam. 6. 9 mm ground-glass nodule in the left lung apex. Initial follow-up with CT at 6-12  months is recommended to confirm persistence 7.  Aortic Atherosclerosis  NO NEW EXAMS.   LABS REVIEWED PREVIOUS   10-19-20: wbc 8.3; hgb 6.8; hct 23.5; mcv 100.4 plt 288; glucose 102; bun 14; creat 0.78; k+ 3.2; na++ 141; ca 9.2; GFR>60; liver normal albumin 2.9; vit B 12: 316; folate 8.7; iron 92; tibc 219; ferritin 109 10-21-20: wbc 8.0; hgb 10.2; hct 33.2; mcv 96.0 plt 280  10-26-20; hgb 12.2; hct 40.1; glucose 101; bun 13; creat 0.80; k+ 4.5; na++ 139; ca 9.3 GFR>60 11-01-20: tsh 1.245 liver normal albumin 3.5  11-29-20: hgb a1c 4.8; chol 144; ldl 74 trig 41; hdl 62; hepatitic C nr; micro-albumin 30.9     NO NEW LABS.   Review of Systems  Constitutional:  Negative for  malaise/fatigue.  Respiratory:  Negative for cough and shortness of breath.   Cardiovascular:  Negative for chest pain, palpitations and leg swelling.  Gastrointestinal:  Negative for abdominal pain, constipation and heartburn.  Musculoskeletal:  Negative for back pain, joint pain and myalgias.  Skin: Negative.   Neurological:  Negative for dizziness.  Psychiatric/Behavioral:  The patient is not nervous/anxious.     Physical Exam Constitutional:      General: She is not in acute distress.    Appearance: She is well-developed. She is not diaphoretic.  Neck:     Thyroid: No thyromegaly.  Cardiovascular:     Rate and Rhythm: Normal rate and regular rhythm.     Pulses: Normal pulses.     Heart sounds: Normal heart sounds.  Pulmonary:     Effort: Pulmonary effort is normal. No respiratory distress.     Breath sounds: Normal breath sounds.  Abdominal:     General: Bowel sounds are normal. There is no distension.     Palpations: Abdomen is soft.     Tenderness: There is no abdominal tenderness.  Musculoskeletal:     Cervical back: Neck supple.     Right lower leg: No edema.     Left lower leg: No edema.     Comments: Left hemiplegia with contractures  Status post left hemiplegia arthroplasty   Lymphadenopathy:      Cervical: No cervical adenopathy.  Skin:    General: Skin is warm and dry.  Neurological:     Mental Status: She is alert. Mental status is at baseline.  Psychiatric:        Mood and Affect: Mood normal.    ASSESSMENT/ PLAN:  TODAY  Closed fracture of left hip with routine healing subsequent encounter: is stable will continue tylenol 650 mg three times daily lidoderm 4%  patch to left hip   2. Acute blood loss anemia as cause of postoperative anemia: is stable hgb 12.2; will stop iron and resolve.   3. Gastrointestinal hemorrhage unspecified gastrointestinal hemorrhage type: is stable hx of transfusion; hgb 12.2 will resolve    PREVIOUS   4. Hypertension associated with type 2 diabetes mellitus: is stable b/p 106/55 will continue lopressor 12.5 mg twice daily   5. Permanent atrial fibrillation/side node dysfunction; heart rate is stable will continue amiodarone 100 mg daily and lopressor 12.5 mg twice daily for rate control; eliquis 2.5 mg twice daily   6. Controlled type 2 diabetes mellitus with hyperglycemia without current use of insulin: is stable will monitor is diet controlled.   7.Hyperlipidemia associated with type 2 diabetes mellitus: is stable ldl 74; will continue lipitor 80 mg daily   8. Chronic constipation: is stable will continue senna s 2 tabs twice daily   9. Cerebrovascular accident (CVA) with left side of body/spastic hemiparesis: is stable will continue to monitor her status.   10. Seizure disorder: no recent reports of seizure activity will continue keppra 500 mg twice daily        Synthia Innocent NP [facility]  Contact [phone] Monday through Friday 8am- 5pm  After hours call [phone]

## 2021-02-21 DIAGNOSIS — Z20822 Contact with and (suspected) exposure to covid-19: Secondary | ICD-10-CM | POA: Diagnosis not present

## 2021-03-12 ENCOUNTER — Ambulatory Visit (INDEPENDENT_AMBULATORY_CARE_PROVIDER_SITE_OTHER): Payer: Medicare Other

## 2021-03-12 DIAGNOSIS — I495 Sick sinus syndrome: Secondary | ICD-10-CM | POA: Diagnosis not present

## 2021-03-12 LAB — CUP PACEART REMOTE DEVICE CHECK
Battery Remaining Longevity: 110 mo
Battery Remaining Percentage: 95.5 %
Battery Voltage: 3.01 V
Brady Statistic AP VP Percent: 1 %
Brady Statistic AP VS Percent: 94 %
Brady Statistic AS VP Percent: 1 %
Brady Statistic AS VS Percent: 5.2 %
Brady Statistic RA Percent Paced: 82 %
Brady Statistic RV Percent Paced: 3.1 %
Date Time Interrogation Session: 20221225143453
Implantable Lead Implant Date: 20220608
Implantable Lead Implant Date: 20220608
Implantable Lead Location: 753859
Implantable Lead Location: 753860
Implantable Pulse Generator Implant Date: 20220608
Lead Channel Impedance Value: 590 Ohm
Lead Channel Impedance Value: 610 Ohm
Lead Channel Pacing Threshold Amplitude: 0.5 V
Lead Channel Pacing Threshold Amplitude: 0.75 V
Lead Channel Pacing Threshold Pulse Width: 0.5 ms
Lead Channel Pacing Threshold Pulse Width: 0.5 ms
Lead Channel Sensing Intrinsic Amplitude: 12 mV
Lead Channel Sensing Intrinsic Amplitude: 5 mV
Lead Channel Setting Pacing Amplitude: 2 V
Lead Channel Setting Pacing Amplitude: 2.5 V
Lead Channel Setting Pacing Pulse Width: 0.5 ms
Lead Channel Setting Sensing Sensitivity: 2 mV
Pulse Gen Model: 2272
Pulse Gen Serial Number: 6462574

## 2021-03-13 ENCOUNTER — Encounter: Payer: Self-pay | Admitting: Internal Medicine

## 2021-03-13 ENCOUNTER — Non-Acute Institutional Stay (SKILLED_NURSING_FACILITY): Payer: Medicare Other | Admitting: Internal Medicine

## 2021-03-13 DIAGNOSIS — I6930 Unspecified sequelae of cerebral infarction: Secondary | ICD-10-CM | POA: Diagnosis not present

## 2021-03-13 DIAGNOSIS — I4821 Permanent atrial fibrillation: Secondary | ICD-10-CM

## 2021-03-13 DIAGNOSIS — D62 Acute posthemorrhagic anemia: Secondary | ICD-10-CM | POA: Diagnosis not present

## 2021-03-13 DIAGNOSIS — I10 Essential (primary) hypertension: Secondary | ICD-10-CM

## 2021-03-13 NOTE — Assessment & Plan Note (Signed)
She denies any bleeding dyscrasias on the Eliquis.  She has had minor intermittent epistaxis which was self resolving.  As noted most recent CBC reveals resolution of the anemia.

## 2021-03-13 NOTE — Patient Instructions (Signed)
See assessment and plan under each diagnosis in the problem list and acutely for this visit 

## 2021-03-13 NOTE — Assessment & Plan Note (Addendum)
Left upper extremity strength is fair to opposition.  The left upper extremity is at right angle across epigastrium with marked flexion of the fingers.  The left lower extremity is inverted.  Left lower extremity is weaker than right lower extremity. She describes intermittent involuntary drawing activity in the left lower extremity.  She feels that Botox has been of some benefit in reference to the post CVA contractures.  She plans to reschedule additional Botox treatments.

## 2021-03-13 NOTE — Assessment & Plan Note (Signed)
BP controlled; no change in antihypertensive medications  

## 2021-03-13 NOTE — Progress Notes (Signed)
NURSING HOME LOCATION: [facility] ROOM NUMBER:  135 P  CODE STATUS:  Full Code  PCP:  Ok Edwards NP  This is a nursing facility follow up visit of chronic medical diagnoses & to to document compliance with Regulation 483.30 (c) in [employer] Manual Phase 2 which mandates caregiver visit ( visits can alternate among physician, PA or NP as per statutes) within 10 days of 30 days / 60 days/ 90 days post admission to SNF date    Interim medical record and care since last SNF visit was updated with review of diagnostic studies and change in clinical status since last visit were documented.  HPI: The patient has been a resident of this facility since discharge from hospital 10/21/2020 following evaluation of GI bleed.  This was in the context of apixaban therapy for a recent prior embolic CVA in the context of A. fib. On 10/10/2020 pacemaker placement was completed for sinus node dysfunction. Baseline hemoglobin had been 13 but hemoglobin dropped to 6.8.  FOBT was positive.  1 unit of packed red cells was administered.  GI performed EGD and colonoscopy which were unrevealing.  Apixaban was reinitiated.  Other diagnoses include diabetes which is diet controlled; history of seizure disorder; dyslipidemia; and essential hypertension.  Review of systems: Her major symptom includes overactive bladder.  She states that when she is redressing following toileting; she is dislodging the pain patch placed on the left thigh. She feels that Botox has helped "a little bit" in reference to the post CVA contractures.  She still describes involuntary movements of the left lower extremity as it will intermittently "draw up". On Eliquis she has had no significant bleeding dyscrasias.  She did have an isolated minor episode of epistaxis which resolved spontaneously.  Constitutional: No fever, significant weight change, fatigue  Eyes: No redness, discharge, pain, vision  change ENT/mouth: No nasal congestion,  purulent discharge, earache, change in hearing, sore throat  Cardiovascular: No chest pain, palpitations, paroxysmal nocturnal dyspnea, claudication, edema  Respiratory: No cough, sputum production, hemoptysis, DOE, significant snoring, apnea   Gastrointestinal: No heartburn, dysphagia, abdominal pain, nausea /vomiting, rectal bleeding, melena, change in bowels Genitourinary: No dysuria, hematuria, pyuria Dermatologic: No rash, pruritus, change in appearance of skin Neurologic: No dizziness, headache, syncope, seizures Psychiatric: No significant anxiety, depression, insomnia, anorexia Endocrine: No change in hair/skin/nails, excessive thirst, excessive hunger Hematologic/lymphatic: No significant bruising, lymphadenopathy Allergy/immunology: No itchy/watery eyes, significant sneezing, urticaria, angioedema  Physical exam:  Pertinent or positive findings: She propels himself in the wheelchair using her right hand and right lower extremity. Left nasolabial fold is decreased.  Heart rhythm is regular.  Abdomen slightly protuberant.  Pedal pulses are decreased.  The left upper extremity forms a right angle across epigastric area.  The fingers the left hand are flexed.  The left foot is inverted.  The right lower extremity is stronger than the left lower extremity.  There is fair strength to opposition of the left upper extremity.  General appearance: Adequately nourished; no acute distress, increased work of breathing is present.   Lymphatic: No lymphadenopathy about the head, neck, axilla. Eyes: No conjunctival inflammation or lid edema is present. There is no scleral icterus. Ears:  External ear exam shows no significant lesions or deformities.   Nose:  External nasal examination shows no deformity or inflammation. Nasal mucosa are pink and moist without lesions, exudates Oral exam:  Lips and gums are healthy appearing. There is no oropharyngeal erythema  or exudate. Neck:  No thyromegaly, masses, tenderness noted.    Heart:  No gallop, murmur, click, rub .  Lungs: Chest clear to auscultation without wheezes, rhonchi, rales, rubs. Abdomen: Bowel sounds are normal. Abdomen is soft and nontender with no organomegaly, hernias, masses. GU: Deferred  Extremities:  No cyanosis, clubbing, edema  Neurologic exam :Balance, Rhomberg, finger to nose testing could not be completed due to clinical state Skin: Warm & dry w/o tenting. No significant lesions or rash.  See summary under each active problem in the Problem List with associated updated therapeutic plan

## 2021-03-13 NOTE — Assessment & Plan Note (Signed)
Rhythm is regular and rate is well controlled.  She has a history of pacemaker placement.

## 2021-03-21 DIAGNOSIS — Z1159 Encounter for screening for other viral diseases: Secondary | ICD-10-CM | POA: Diagnosis not present

## 2021-03-21 DIAGNOSIS — S72002D Fracture of unspecified part of neck of left femur, subsequent encounter for closed fracture with routine healing: Secondary | ICD-10-CM | POA: Diagnosis not present

## 2021-03-21 NOTE — Progress Notes (Signed)
Remote pacemaker transmission.   

## 2021-03-28 DIAGNOSIS — S72002D Fracture of unspecified part of neck of left femur, subsequent encounter for closed fracture with routine healing: Secondary | ICD-10-CM | POA: Diagnosis not present

## 2021-03-28 DIAGNOSIS — Z1159 Encounter for screening for other viral diseases: Secondary | ICD-10-CM | POA: Diagnosis not present

## 2021-04-04 DIAGNOSIS — S72002D Fracture of unspecified part of neck of left femur, subsequent encounter for closed fracture with routine healing: Secondary | ICD-10-CM | POA: Diagnosis not present

## 2021-04-04 DIAGNOSIS — Z1159 Encounter for screening for other viral diseases: Secondary | ICD-10-CM | POA: Diagnosis not present

## 2021-04-05 ENCOUNTER — Encounter: Payer: Self-pay | Admitting: Adult Health

## 2021-04-05 ENCOUNTER — Non-Acute Institutional Stay (SKILLED_NURSING_FACILITY): Payer: Medicare Other | Admitting: Adult Health

## 2021-04-05 DIAGNOSIS — G8110 Spastic hemiplegia affecting unspecified side: Secondary | ICD-10-CM | POA: Diagnosis not present

## 2021-04-05 DIAGNOSIS — S72002D Fracture of unspecified part of neck of left femur, subsequent encounter for closed fracture with routine healing: Secondary | ICD-10-CM

## 2021-04-05 DIAGNOSIS — I4821 Permanent atrial fibrillation: Secondary | ICD-10-CM | POA: Diagnosis not present

## 2021-04-05 NOTE — Progress Notes (Signed)
Location:  [facility] Room Number: 135-D Place of Service:  SNF (31)   CODE STATUS: Full Code  Allergies  Allergen Reactions   Lactose Intolerance (Gi) Other (See Comments)    Causes Gas in patient.    Chief Complaint  Patient presents with   Acute Visit    Care plan meeting    HPI:  We have come together for her care plan meeting. BIMS 11/15 mood 2/30: not sleeping well. She is nonambulatory. No further falls. She requires limited to extensive assist with her adls. She is occasionally incontinent of bladder and bowel. Dietary: regular feeds self good appetite weight is 127 pounds up 12.6 pounds in the past 3 months.  Therapy none at this time. She continues to be followed for her chronic illnesses including:   Permanent atrial fibrillation  Spastic hemiparesis  Closed fracture of left hip with routine healing subsequent encounter  Past Medical History:  Diagnosis Date   Atrial fibrillation [facility])    Cerebrovascular accident (CVA) with involvement of left side of body (HCC) 09/20/2018   High cholesterol    Hypertension    Seizures (HCC)    Type 2 diabetes mellitus (HCC)     Past Surgical History:  Procedure Laterality Date   ABDOMINAL HYSTERECTOMY     COLONOSCOPY WITH PROPOFOL N/A 10/20/2020   Procedure: COLONOSCOPY WITH PROPOFOL;  Surgeon: Malissa Hippo, MD;  [facility];  Service: Endoscopy;  Laterality: N/A;   ESOPHAGOGASTRODUODENOSCOPY (EGD) WITH PROPOFOL N/A 10/20/2020   Procedure: ESOPHAGOGASTRODUODENOSCOPY (EGD) WITH PROPOFOL;  Surgeon: Malissa Hippo, MD;  [facility]: [facility];  Service: Endoscopy;  Laterality: N/A;   HIP ARTHROPLASTY Left 10/10/2020   Procedure: ARTHROPLASTY BIPOLAR HIP (HEMIARTHROPLASTY);  Surgeon: Oliver Barre, MD;  Location: [facility] ORS;  Service: Orthopedics;  Laterality: Left;   PACEMAKER IMPLANT N/A 08/22/2020   Procedure: PACEMAKER IMPLANT;  Surgeon: Marinus Maw, MD;  Location: [facility] INVASIVE CV LAB;   Service: Cardiovascular;  Laterality: N/A;   TUBAL LIGATION      Social History   Socioeconomic History   Marital status: Divorced    Spouse name: Not on file   Number of children: Not on file   Years of education: Not on file   Highest education level: Not on file  Occupational History   Not on file  Tobacco Use   Smoking status: Former    Types: Cigarettes    Quit date: 04/14/1988    Years since quitting: 32.9   Smokeless tobacco: Never  Vaping Use   Vaping Use: Never used  Substance and Sexual Activity   Alcohol use: Never   Drug use: Never   Sexual activity: Not on file  Other Topics Concern   Not on file  Social History Narrative   Not on file   Social Determinants of Health   Financial Resource Strain: Not on file  Food Insecurity: Not on file  Transportation Needs: Not on file  Physical Activity: Not on file  Stress: Not on file  Social Connections: Not on file  Intimate Partner Violence: Not on file   Family History  Problem Relation Age of Onset   Stroke Mother    Heart attack Father    Stroke Sister    Heart attack Sister       VITAL SIGNS BP 120/64    Pulse 62    Temp 97.9 F (36.6 C)    Resp 20    Ht 5\' 3"  (1.6  m)    Wt 127 lb (57.6 kg)    SpO2 100%    BMI 22.50 kg/m   Outpatient Encounter Medications as of 04/05/2021  Medication Sig   A&D OINT at bedtime. to bilateral heels qhs for prevention   acetaminophen (TYLENOL) 650 MG CR tablet Take 650 mg by mouth 3 (three) times daily.   amiodarone (PACERONE) 200 MG tablet Take 100 mg by mouth daily.   amLODipine (NORVASC) 5 MG tablet Take 5 mg by mouth daily.   apixaban (ELIQUIS) 2.5 MG TABS tablet Take by mouth 2 (two) times daily.   atorvastatin (LIPITOR) 80 MG tablet Take 1 tablet (80 mg total) by mouth daily at 6 PM.   Balsam [place]-Castor Oil (VENELEX) OINT Apply 1 application topically as directed. Apply to sacrum and coccyx qshift for prevention. Every Shift; Day, Evening, Night   bisacodyl  (DULCOLAX) 10 MG suppository Place 10 mg rectally as needed for moderate constipation.   Ensure (ENSURE) Take 237 mLs by mouth daily.   lactase (LACTAID) 3000 units tablet Take 3,000 Units by mouth as needed.   levETIRAcetam (KEPPRA) 500 MG tablet Take 500 mg by mouth 2 (two) times daily.   metoprolol tartrate (LOPRESSOR) 25 MG tablet Take 0.5 tablets (12.5 mg total) by mouth 2 (two) times daily.   NON FORMULARY Diet: Regular   senna-docusate (SENOKOT-S) 8.6-50 MG tablet Take 1 tablet by mouth 2 (two) times daily. 10 am and 10 pm   ferrous sulfate 325 (65 FE) MG EC tablet Take 1 tablet (325 mg total) by mouth daily with breakfast.   [DISCONTINUED] Lidocaine 4 % PTCH Apply 1 patch topically. apply one patch to the left hip in the AM and remove at HS.   No facility-administered encounter medications on file as of 04/05/2021.     SIGNIFICANT DIAGNOSTIC EXAMS   REVIEWED PREVIOUS    10-19-20: ct of chest abdomen and pelvis:  1. Small amount of free presacral fluid, slightly increased in volume since 10/09/2020, nonspecific but of uncertain etiology. No evidence of traumatic injury to the abdominal or pelvic viscera. 2. Postsurgical changes reflecting left arthroplasty for treatment of a recent proximal femoral fracture without evidence of complication or new fracture. 3. Mild intrahepatic biliary ductal dilatation; no obstructing lesion seen. Correlate with LFTs. 4. Diverticulosis without evidence of acute diverticulitis. 5. Skin thickening over the left buttock is unchanged; correlate with physical exam. 6. 9 mm ground-glass nodule in the left lung apex. Initial follow-up with CT at 6-12 months is recommended to confirm persistence 7.  Aortic Atherosclerosis  NO NEW EXAMS.   LABS REVIEWED PREVIOUS   10-19-20: wbc 8.3; hgb 6.8; hct 23.5; mcv 100.4 plt 288; glucose 102; bun 14; creat 0.78; k+ 3.2; na++ 141; ca 9.2; GFR>60; liver normal albumin 2.9; vit B 12: 316; folate 8.7; iron 92; tibc 219;  ferritin 109 10-21-20: wbc 8.0; hgb 10.2; hct 33.2; mcv 96.0 plt 280  10-26-20; hgb 12.2; hct 40.1; glucose 101; bun 13; creat 0.80; k+ 4.5; na++ 139; ca 9.3 GFR>60 11-01-20: tsh 1.245 liver normal albumin 3.5  11-29-20: hgb a1c 4.8; chol 144; ldl 74 trig 41; hdl 62; hepatitic C nr; micro-albumin 30.9     NO NEW LABS.   Review of Systems  Constitutional:  Negative for malaise/fatigue.  Respiratory:  Negative for cough and shortness of breath.   Cardiovascular:  Negative for chest pain, palpitations and leg swelling.  Gastrointestinal:  Negative for abdominal pain, constipation and heartburn.  Musculoskeletal:  Negative for  back pain, joint pain and myalgias.  Skin: Negative.   Neurological:  Negative for dizziness.  Psychiatric/Behavioral:  The patient is not nervous/anxious.     Physical Exam Constitutional:      General: She is not in acute distress.    Appearance: She is well-developed. She is not diaphoretic.  Neck:     Thyroid: No thyromegaly.  Cardiovascular:     Rate and Rhythm: Normal rate and regular rhythm.     Pulses: Normal pulses.     Heart sounds: Normal heart sounds.  Pulmonary:     Effort: Pulmonary effort is normal. No respiratory distress.     Breath sounds: Normal breath sounds.  Abdominal:     General: Bowel sounds are normal. There is no distension.     Palpations: Abdomen is soft.     Tenderness: There is no abdominal tenderness.  Musculoskeletal:     Cervical back: Neck supple.     Right lower leg: No edema.     Left lower leg: No edema.     Comments:  Left hemiplegia with contractures  Status post left hemiplegia arthroplasty    Lymphadenopathy:     Cervical: No cervical adenopathy.  Skin:    General: Skin is warm and dry.  Neurological:     Mental Status: She is alert. Mental status is at baseline.  Psychiatric:        Mood and Affect: Mood normal.     ASSESSMENT/ PLAN:  TODAY  Permanent atrial fibrillation Spastic hemiparesis Closed  fracture of left hip with routine healing subsequent encounter  Will continue current medications Will continue current plan of care Will continue to monitor her status.   Time spent with patient 40 minutes: medications plan of care .   Ok Edwards NP [facility]   call [phone]

## 2021-04-10 ENCOUNTER — Non-Acute Institutional Stay (SKILLED_NURSING_FACILITY): Payer: Medicare Other | Admitting: Adult Health

## 2021-04-10 ENCOUNTER — Encounter: Payer: Self-pay | Admitting: Adult Health

## 2021-04-10 ENCOUNTER — Other Ambulatory Visit: Payer: Self-pay | Admitting: Adult Health

## 2021-04-10 DIAGNOSIS — G8110 Spastic hemiplegia affecting unspecified side: Secondary | ICD-10-CM | POA: Diagnosis not present

## 2021-04-10 DIAGNOSIS — S72002D Fracture of unspecified part of neck of left femur, subsequent encounter for closed fracture with routine healing: Secondary | ICD-10-CM

## 2021-04-10 DIAGNOSIS — G40909 Epilepsy, unspecified, not intractable, without status epilepticus: Secondary | ICD-10-CM | POA: Diagnosis not present

## 2021-04-10 MED ORDER — ATORVASTATIN CALCIUM 80 MG PO TABS: 80.0000 mg | ORAL_TABLET | Freq: Every day | ORAL | 0 refills | Status: AC

## 2021-04-10 MED ORDER — APIXABAN 2.5 MG PO TABS: 2.5000 mg | ORAL_TABLET | Freq: Two times a day (BID) | ORAL | 0 refills | Status: DC

## 2021-04-10 MED ORDER — LEVETIRACETAM 500 MG PO TABS: 500.0000 mg | ORAL_TABLET | Freq: Two times a day (BID) | ORAL | 0 refills | Status: DC

## 2021-04-10 MED ORDER — METOPROLOL TARTRATE 25 MG PO TABS: 12.5000 mg | ORAL_TABLET | Freq: Two times a day (BID) | ORAL | 0 refills | Status: AC

## 2021-04-10 MED ORDER — FERROUS SULFATE 325 (65 FE) MG PO TBEC: 325.0000 mg | DELAYED_RELEASE_TABLET | Freq: Every day | ORAL | 0 refills | Status: DC

## 2021-04-10 MED ORDER — AMIODARONE HCL 200 MG PO TABS: 100.0000 mg | ORAL_TABLET | Freq: Every day | ORAL | 0 refills | Status: DC

## 2021-04-10 MED ORDER — AMLODIPINE BESYLATE 5 MG PO TABS: 5.0000 mg | ORAL_TABLET | Freq: Every day | ORAL | 0 refills | Status: AC

## 2021-04-10 NOTE — Progress Notes (Deleted)
°.  dsg

## 2021-04-10 NOTE — Progress Notes (Signed)
Location:  [place] Room Number: 135 Place of Service:  SNF (31)   CODE STATUS: full code   Allergies  Allergen Reactions   Lactose Intolerance (Gi) Other (See Comments)    Causes Gas in patient.    Chief Complaint  Patient presents with   Discharge Note    HPI:  She is being discharged to home with home health for pt/ot. She will not need any dme. She will need her prescriptions written and will need to follow up with her medical provider. She had been hospitalized for a left hip fracture. She had completed therapy. She has had a prolonged stay at this facility and has made preparations to return back home.   Past Medical History:  Diagnosis Date   Atrial fibrillation [facility])    Cerebrovascular accident (CVA) with involvement of left side of body ([place]) 09/20/2018   High cholesterol    Hypertension    Seizures ([place])    Type 2 diabetes mellitus ([place])     Past Surgical History:  Procedure Laterality Date   ABDOMINAL HYSTERECTOMY     COLONOSCOPY WITH PROPOFOL N/A 10/20/2020   Procedure: COLONOSCOPY WITH PROPOFOL;  Surgeon: Rogene Houston, MD;  [facility];  Service: Endoscopy;  Laterality: N/A;   ESOPHAGOGASTRODUODENOSCOPY (EGD) WITH PROPOFOL N/A 10/20/2020   Procedure: ESOPHAGOGASTRODUODENOSCOPY (EGD) WITH PROPOFOL;  Surgeon: Rogene Houston, MD;  [facility];  Service: Endoscopy;  Laterality: N/A;   HIP ARTHROPLASTY Left 10/10/2020   Procedure: ARTHROPLASTY BIPOLAR HIP (HEMIARTHROPLASTY);  Surgeon: Mordecai Rasmussen, MD;  Location: [facility] ORS;  Service: Orthopedics;  Laterality: Left;   PACEMAKER IMPLANT N/A 08/22/2020   Procedure: PACEMAKER IMPLANT;  Surgeon: Evans Lance, MD;  Location: [facility] CV LAB;  Service: Cardiovascular;  Laterality: N/A;   TUBAL LIGATION      Social History   Socioeconomic History   Marital status: Divorced    Spouse name: Not on file   Number of children: Not on file   Years of education: Not  on file   Highest education level: Not on file  Occupational History   Not on file  Tobacco Use   Smoking status: Former    Types: Cigarettes    Quit date: 04/14/1988    Years since quitting: 33.0   Smokeless tobacco: Never  Vaping Use   Vaping Use: Never used  Substance and Sexual Activity   Alcohol use: Never   Drug use: Never   Sexual activity: Not on file  Other Topics Concern   Not on file  Social History Narrative   Not on file   Social Determinants of Health   Financial Resource Strain: Not on file  Food Insecurity: Not on file  Transportation Needs: Not on file  Physical Activity: Not on file  Stress: Not on file  Social Connections: Not on file  Intimate Partner Violence: Not on file   Family History  Problem Relation Age of Onset   Stroke Mother    Heart attack Father    Stroke Sister    Heart attack Sister       VITAL SIGNS BP 140/64    Pulse 60    Temp 98 F (36.7 C)    Resp 18    Ht 5\' 5"  (1.651 m)    Wt 127 lb (57.6 kg)    BMI 21.13 kg/m   Outpatient Encounter Medications as of 04/10/2021  Medication Sig   A&D OINT at bedtime.  to bilateral heels qhs for prevention   acetaminophen (TYLENOL) 650 MG CR tablet Take 650 mg by mouth 3 (three) times daily.   amiodarone (PACERONE) 200 MG tablet Take 100 mg by mouth daily.   amLODipine (NORVASC) 5 MG tablet Take 5 mg by mouth daily.   apixaban (ELIQUIS) 2.5 MG TABS tablet Take by mouth 2 (two) times daily.   atorvastatin (LIPITOR) 80 MG tablet Take 1 tablet (80 mg total) by mouth daily at 6 PM.   Balsam [place]-Castor Oil (VENELEX) OINT Apply 1 application topically as directed. Apply to sacrum and coccyx qshift for prevention. Every Shift; Day, Evening, Night   bisacodyl (DULCOLAX) 10 MG suppository Place 10 mg rectally as needed for moderate constipation.   Ensure (ENSURE) Take 237 mLs by mouth daily.   ferrous sulfate 325 (65 FE) MG EC tablet Take 1 tablet (325 mg total) by mouth daily with breakfast.    lactase (LACTAID) 3000 units tablet Take 3,000 Units by mouth as needed.   levETIRAcetam (KEPPRA) 500 MG tablet Take 500 mg by mouth 2 (two) times daily.   metoprolol tartrate (LOPRESSOR) 25 MG tablet Take 0.5 tablets (12.5 mg total) by mouth 2 (two) times daily.   NON FORMULARY Diet: Regular   senna-docusate (SENOKOT-S) 8.6-50 MG tablet Take 1 tablet by mouth 2 (two) times daily. 10 am and 10 pm   No facility-administered encounter medications on file as of 04/10/2021.     SIGNIFICANT DIAGNOSTIC EXAMS   REVIEWED PREVIOUS    10-19-20: ct of chest abdomen and pelvis:  1. Small amount of free presacral fluid, slightly increased in volume since 10/09/2020, nonspecific but of uncertain etiology. No evidence of traumatic injury to the abdominal or pelvic viscera. 2. Postsurgical changes reflecting left arthroplasty for treatment of a recent proximal femoral fracture without evidence of complication or new fracture. 3. Mild intrahepatic biliary ductal dilatation; no obstructing lesion seen. Correlate with LFTs. 4. Diverticulosis without evidence of acute diverticulitis. 5. Skin thickening over the left buttock is unchanged; correlate with physical exam. 6. 9 mm ground-glass nodule in the left lung apex. Initial follow-up with CT at 6-12 months is recommended to confirm persistence 7.  Aortic Atherosclerosis  NO NEW EXAMS.   LABS REVIEWED PREVIOUS   10-19-20: wbc 8.3; hgb 6.8; hct 23.5; mcv 100.4 plt 288; glucose 102; bun 14; creat 0.78; k+ 3.2; na++ 141; ca 9.2; GFR>60; liver normal albumin 2.9; vit B 12: 316; folate 8.7; iron 92; tibc 219; ferritin 109 10-21-20: wbc 8.0; hgb 10.2; hct 33.2; mcv 96.0 plt 280  10-26-20; hgb 12.2; hct 40.1; glucose 101; bun 13; creat 0.80; k+ 4.5; na++ 139; ca 9.3 GFR>60 11-01-20: tsh 1.245 liver normal albumin 3.5  11-29-20: hgb a1c 4.8; chol 144; ldl 74 trig 41; hdl 62; hepatitic C nr; micro-albumin 30.9     NO NEW LABS.   Review of Systems  Constitutional:   Negative for malaise/fatigue.  Respiratory:  Negative for cough and shortness of breath.   Cardiovascular:  Negative for chest pain, palpitations and leg swelling.  Gastrointestinal:  Negative for abdominal pain, constipation and heartburn.  Musculoskeletal:  Negative for back pain, joint pain and myalgias.  Skin: Negative.   Neurological:  Negative for dizziness.  Psychiatric/Behavioral:  The patient is not nervous/anxious.    Physical Exam Constitutional:      General: She is not in acute distress.    Appearance: She is well-developed. She is not diaphoretic.  Neck:     Thyroid: No thyromegaly.  Cardiovascular:     Rate and Rhythm: Normal rate and regular rhythm.     Pulses: Normal pulses.     Heart sounds: Normal heart sounds.  Pulmonary:     Effort: Pulmonary effort is normal. No respiratory distress.     Breath sounds: Normal breath sounds.  Abdominal:     General: Bowel sounds are normal. There is no distension.     Palpations: Abdomen is soft.     Tenderness: There is no abdominal tenderness.  Musculoskeletal:     Cervical back: Neck supple.     Right lower leg: No edema.     Left lower leg: No edema.     Comments: Left hemiplegia with contractures  Status post left hemiplegia arthroplasty  Lymphadenopathy:     Cervical: No cervical adenopathy.  Skin:    General: Skin is warm and dry.  Neurological:     Mental Status: She is alert and oriented to person, place, and time.  Psychiatric:        Mood and Affect: Mood normal.     ASSESSMENT/ PLAN:   DISCHARGE ORDERS:   Home health: Pt/Ot to evaluate and treat as indicated for gait balance strength adl training.   2. DME: none needed  A 30 day supply of [facility].   Time spent with patient: 35 minutes: medications home health needs and dme needs.   Ok Edwards NP [facility]   call [phone]

## 2021-04-11 DIAGNOSIS — S72002D Fracture of unspecified part of neck of left femur, subsequent encounter for closed fracture with routine healing: Secondary | ICD-10-CM | POA: Diagnosis not present

## 2021-04-11 DIAGNOSIS — Z1159 Encounter for screening for other viral diseases: Secondary | ICD-10-CM | POA: Diagnosis not present

## 2021-04-29 DIAGNOSIS — Z20822 Contact with and (suspected) exposure to covid-19: Secondary | ICD-10-CM | POA: Diagnosis not present

## 2021-05-01 ENCOUNTER — Ambulatory Visit: Payer: Medicare Other | Admitting: Cardiology

## 2021-05-11 DIAGNOSIS — G40909 Epilepsy, unspecified, not intractable, without status epilepticus: Secondary | ICD-10-CM | POA: Diagnosis not present

## 2021-05-11 DIAGNOSIS — Z96642 Presence of left artificial hip joint: Secondary | ICD-10-CM | POA: Diagnosis not present

## 2021-05-11 DIAGNOSIS — Z9181 History of falling: Secondary | ICD-10-CM | POA: Diagnosis not present

## 2021-05-11 DIAGNOSIS — Z7901 Long term (current) use of anticoagulants: Secondary | ICD-10-CM | POA: Diagnosis not present

## 2021-05-11 DIAGNOSIS — M62422 Contracture of muscle, left upper arm: Secondary | ICD-10-CM | POA: Diagnosis not present

## 2021-05-11 DIAGNOSIS — Z87891 Personal history of nicotine dependence: Secondary | ICD-10-CM | POA: Diagnosis not present

## 2021-05-11 DIAGNOSIS — I10 Essential (primary) hypertension: Secondary | ICD-10-CM | POA: Diagnosis not present

## 2021-05-11 DIAGNOSIS — S72002D Fracture of unspecified part of neck of left femur, subsequent encounter for closed fracture with routine healing: Secondary | ICD-10-CM | POA: Diagnosis not present

## 2021-05-11 DIAGNOSIS — G8110 Spastic hemiplegia affecting unspecified side: Secondary | ICD-10-CM | POA: Diagnosis not present

## 2021-05-11 DIAGNOSIS — I69391 Dysphagia following cerebral infarction: Secondary | ICD-10-CM | POA: Diagnosis not present

## 2021-05-11 DIAGNOSIS — D500 Iron deficiency anemia secondary to blood loss (chronic): Secondary | ICD-10-CM | POA: Diagnosis not present

## 2021-05-11 DIAGNOSIS — I4821 Permanent atrial fibrillation: Secondary | ICD-10-CM | POA: Diagnosis not present

## 2021-05-11 DIAGNOSIS — R41841 Cognitive communication deficit: Secondary | ICD-10-CM | POA: Diagnosis not present

## 2021-05-11 DIAGNOSIS — Z9851 Tubal ligation status: Secondary | ICD-10-CM | POA: Diagnosis not present

## 2021-05-11 DIAGNOSIS — Z950 Presence of cardiac pacemaker: Secondary | ICD-10-CM | POA: Diagnosis not present

## 2021-05-11 DIAGNOSIS — I495 Sick sinus syndrome: Secondary | ICD-10-CM | POA: Diagnosis not present

## 2021-05-11 DIAGNOSIS — M6281 Muscle weakness (generalized): Secondary | ICD-10-CM | POA: Diagnosis not present

## 2021-05-11 DIAGNOSIS — E1159 Type 2 diabetes mellitus with other circulatory complications: Secondary | ICD-10-CM | POA: Diagnosis not present

## 2021-05-11 DIAGNOSIS — E7800 Pure hypercholesterolemia, unspecified: Secondary | ICD-10-CM | POA: Diagnosis not present

## 2021-05-14 DIAGNOSIS — E1165 Type 2 diabetes mellitus with hyperglycemia: Secondary | ICD-10-CM | POA: Diagnosis not present

## 2021-05-14 DIAGNOSIS — R350 Frequency of micturition: Secondary | ICD-10-CM | POA: Diagnosis not present

## 2021-05-20 DIAGNOSIS — G40909 Epilepsy, unspecified, not intractable, without status epilepticus: Secondary | ICD-10-CM | POA: Diagnosis not present

## 2021-05-20 DIAGNOSIS — M62422 Contracture of muscle, left upper arm: Secondary | ICD-10-CM | POA: Diagnosis not present

## 2021-05-20 DIAGNOSIS — M6281 Muscle weakness (generalized): Secondary | ICD-10-CM | POA: Diagnosis not present

## 2021-05-20 DIAGNOSIS — R41841 Cognitive communication deficit: Secondary | ICD-10-CM | POA: Diagnosis not present

## 2021-05-20 DIAGNOSIS — S72002D Fracture of unspecified part of neck of left femur, subsequent encounter for closed fracture with routine healing: Secondary | ICD-10-CM | POA: Diagnosis not present

## 2021-05-20 DIAGNOSIS — G8110 Spastic hemiplegia affecting unspecified side: Secondary | ICD-10-CM | POA: Diagnosis not present

## 2021-05-22 DIAGNOSIS — M62422 Contracture of muscle, left upper arm: Secondary | ICD-10-CM | POA: Diagnosis not present

## 2021-05-22 DIAGNOSIS — G8110 Spastic hemiplegia affecting unspecified side: Secondary | ICD-10-CM | POA: Diagnosis not present

## 2021-05-22 DIAGNOSIS — M6281 Muscle weakness (generalized): Secondary | ICD-10-CM | POA: Diagnosis not present

## 2021-05-22 DIAGNOSIS — R41841 Cognitive communication deficit: Secondary | ICD-10-CM | POA: Diagnosis not present

## 2021-05-22 DIAGNOSIS — Z20828 Contact with and (suspected) exposure to other viral communicable diseases: Secondary | ICD-10-CM | POA: Diagnosis not present

## 2021-05-22 DIAGNOSIS — S72002D Fracture of unspecified part of neck of left femur, subsequent encounter for closed fracture with routine healing: Secondary | ICD-10-CM | POA: Diagnosis not present

## 2021-05-22 DIAGNOSIS — G40909 Epilepsy, unspecified, not intractable, without status epilepticus: Secondary | ICD-10-CM | POA: Diagnosis not present

## 2021-05-27 DIAGNOSIS — R41841 Cognitive communication deficit: Secondary | ICD-10-CM | POA: Diagnosis not present

## 2021-05-27 DIAGNOSIS — G8110 Spastic hemiplegia affecting unspecified side: Secondary | ICD-10-CM | POA: Diagnosis not present

## 2021-05-27 DIAGNOSIS — M6281 Muscle weakness (generalized): Secondary | ICD-10-CM | POA: Diagnosis not present

## 2021-05-27 DIAGNOSIS — S72002D Fracture of unspecified part of neck of left femur, subsequent encounter for closed fracture with routine healing: Secondary | ICD-10-CM | POA: Diagnosis not present

## 2021-05-27 DIAGNOSIS — G40909 Epilepsy, unspecified, not intractable, without status epilepticus: Secondary | ICD-10-CM | POA: Diagnosis not present

## 2021-05-27 DIAGNOSIS — M62422 Contracture of muscle, left upper arm: Secondary | ICD-10-CM | POA: Diagnosis not present

## 2021-05-29 DIAGNOSIS — G8110 Spastic hemiplegia affecting unspecified side: Secondary | ICD-10-CM | POA: Diagnosis not present

## 2021-05-29 DIAGNOSIS — M62422 Contracture of muscle, left upper arm: Secondary | ICD-10-CM | POA: Diagnosis not present

## 2021-05-29 DIAGNOSIS — G40909 Epilepsy, unspecified, not intractable, without status epilepticus: Secondary | ICD-10-CM | POA: Diagnosis not present

## 2021-05-29 DIAGNOSIS — S72002D Fracture of unspecified part of neck of left femur, subsequent encounter for closed fracture with routine healing: Secondary | ICD-10-CM | POA: Diagnosis not present

## 2021-05-29 DIAGNOSIS — M6281 Muscle weakness (generalized): Secondary | ICD-10-CM | POA: Diagnosis not present

## 2021-05-29 DIAGNOSIS — R41841 Cognitive communication deficit: Secondary | ICD-10-CM | POA: Diagnosis not present

## 2021-06-10 DIAGNOSIS — M6281 Muscle weakness (generalized): Secondary | ICD-10-CM | POA: Diagnosis not present

## 2021-06-10 DIAGNOSIS — D500 Iron deficiency anemia secondary to blood loss (chronic): Secondary | ICD-10-CM | POA: Diagnosis not present

## 2021-06-10 DIAGNOSIS — E7800 Pure hypercholesterolemia, unspecified: Secondary | ICD-10-CM | POA: Diagnosis not present

## 2021-06-10 DIAGNOSIS — Z96642 Presence of left artificial hip joint: Secondary | ICD-10-CM | POA: Diagnosis not present

## 2021-06-10 DIAGNOSIS — Z9851 Tubal ligation status: Secondary | ICD-10-CM | POA: Diagnosis not present

## 2021-06-10 DIAGNOSIS — M62422 Contracture of muscle, left upper arm: Secondary | ICD-10-CM | POA: Diagnosis not present

## 2021-06-10 DIAGNOSIS — Z950 Presence of cardiac pacemaker: Secondary | ICD-10-CM | POA: Diagnosis not present

## 2021-06-10 DIAGNOSIS — Z9181 History of falling: Secondary | ICD-10-CM | POA: Diagnosis not present

## 2021-06-10 DIAGNOSIS — G8110 Spastic hemiplegia affecting unspecified side: Secondary | ICD-10-CM | POA: Diagnosis not present

## 2021-06-10 DIAGNOSIS — E1159 Type 2 diabetes mellitus with other circulatory complications: Secondary | ICD-10-CM | POA: Diagnosis not present

## 2021-06-10 DIAGNOSIS — R41841 Cognitive communication deficit: Secondary | ICD-10-CM | POA: Diagnosis not present

## 2021-06-10 DIAGNOSIS — I4821 Permanent atrial fibrillation: Secondary | ICD-10-CM | POA: Diagnosis not present

## 2021-06-10 DIAGNOSIS — G40909 Epilepsy, unspecified, not intractable, without status epilepticus: Secondary | ICD-10-CM | POA: Diagnosis not present

## 2021-06-10 DIAGNOSIS — I10 Essential (primary) hypertension: Secondary | ICD-10-CM | POA: Diagnosis not present

## 2021-06-10 DIAGNOSIS — S72002D Fracture of unspecified part of neck of left femur, subsequent encounter for closed fracture with routine healing: Secondary | ICD-10-CM | POA: Diagnosis not present

## 2021-06-10 DIAGNOSIS — Z7901 Long term (current) use of anticoagulants: Secondary | ICD-10-CM | POA: Diagnosis not present

## 2021-06-10 DIAGNOSIS — I495 Sick sinus syndrome: Secondary | ICD-10-CM | POA: Diagnosis not present

## 2021-06-10 DIAGNOSIS — Z87891 Personal history of nicotine dependence: Secondary | ICD-10-CM | POA: Diagnosis not present

## 2021-06-10 DIAGNOSIS — I69391 Dysphagia following cerebral infarction: Secondary | ICD-10-CM | POA: Diagnosis not present

## 2021-06-11 ENCOUNTER — Ambulatory Visit (INDEPENDENT_AMBULATORY_CARE_PROVIDER_SITE_OTHER): Payer: Medicare Other

## 2021-06-11 DIAGNOSIS — I495 Sick sinus syndrome: Secondary | ICD-10-CM

## 2021-06-11 LAB — CUP PACEART REMOTE DEVICE CHECK
Battery Remaining Longevity: 107 mo
Battery Remaining Percentage: 95 %
Battery Voltage: 3.01 V
Brady Statistic AP VP Percent: 1 %
Brady Statistic AP VS Percent: 93 %
Brady Statistic AS VP Percent: 1 %
Brady Statistic AS VS Percent: 6.8 %
Brady Statistic RA Percent Paced: 86 %
Brady Statistic RV Percent Paced: 1.6 %
Date Time Interrogation Session: 20230328020015
Implantable Lead Implant Date: 20220608
Implantable Lead Implant Date: 20220608
Implantable Lead Location: 753859
Implantable Lead Location: 753860
Implantable Pulse Generator Implant Date: 20220608
Lead Channel Impedance Value: 540 Ohm
Lead Channel Impedance Value: 600 Ohm
Lead Channel Pacing Threshold Amplitude: 0.5 V
Lead Channel Pacing Threshold Amplitude: 0.75 V
Lead Channel Pacing Threshold Pulse Width: 0.5 ms
Lead Channel Pacing Threshold Pulse Width: 0.5 ms
Lead Channel Sensing Intrinsic Amplitude: 12 mV
Lead Channel Sensing Intrinsic Amplitude: 5 mV
Lead Channel Setting Pacing Amplitude: 2 V
Lead Channel Setting Pacing Amplitude: 2.5 V
Lead Channel Setting Pacing Pulse Width: 0.5 ms
Lead Channel Setting Sensing Sensitivity: 2 mV
Pulse Gen Model: 2272
Pulse Gen Serial Number: 6462574

## 2021-06-14 DIAGNOSIS — R350 Frequency of micturition: Secondary | ICD-10-CM | POA: Diagnosis not present

## 2021-06-14 DIAGNOSIS — M6281 Muscle weakness (generalized): Secondary | ICD-10-CM | POA: Diagnosis not present

## 2021-06-14 DIAGNOSIS — G40909 Epilepsy, unspecified, not intractable, without status epilepticus: Secondary | ICD-10-CM | POA: Diagnosis not present

## 2021-06-14 DIAGNOSIS — R41841 Cognitive communication deficit: Secondary | ICD-10-CM | POA: Diagnosis not present

## 2021-06-14 DIAGNOSIS — S72002D Fracture of unspecified part of neck of left femur, subsequent encounter for closed fracture with routine healing: Secondary | ICD-10-CM | POA: Diagnosis not present

## 2021-06-14 DIAGNOSIS — E1165 Type 2 diabetes mellitus with hyperglycemia: Secondary | ICD-10-CM | POA: Diagnosis not present

## 2021-06-14 DIAGNOSIS — G8110 Spastic hemiplegia affecting unspecified side: Secondary | ICD-10-CM | POA: Diagnosis not present

## 2021-06-14 DIAGNOSIS — M62422 Contracture of muscle, left upper arm: Secondary | ICD-10-CM | POA: Diagnosis not present

## 2021-06-15 DIAGNOSIS — G40909 Epilepsy, unspecified, not intractable, without status epilepticus: Secondary | ICD-10-CM | POA: Diagnosis not present

## 2021-06-15 DIAGNOSIS — R41841 Cognitive communication deficit: Secondary | ICD-10-CM | POA: Diagnosis not present

## 2021-06-15 DIAGNOSIS — M62422 Contracture of muscle, left upper arm: Secondary | ICD-10-CM | POA: Diagnosis not present

## 2021-06-15 DIAGNOSIS — S72002D Fracture of unspecified part of neck of left femur, subsequent encounter for closed fracture with routine healing: Secondary | ICD-10-CM | POA: Diagnosis not present

## 2021-06-15 DIAGNOSIS — M6281 Muscle weakness (generalized): Secondary | ICD-10-CM | POA: Diagnosis not present

## 2021-06-15 DIAGNOSIS — G8110 Spastic hemiplegia affecting unspecified side: Secondary | ICD-10-CM | POA: Diagnosis not present

## 2021-06-20 DIAGNOSIS — Z20822 Contact with and (suspected) exposure to covid-19: Secondary | ICD-10-CM | POA: Diagnosis not present

## 2021-06-24 NOTE — Progress Notes (Signed)
Remote pacemaker transmission.   

## 2021-06-26 DIAGNOSIS — G8110 Spastic hemiplegia affecting unspecified side: Secondary | ICD-10-CM | POA: Diagnosis not present

## 2021-06-26 DIAGNOSIS — M62422 Contracture of muscle, left upper arm: Secondary | ICD-10-CM | POA: Diagnosis not present

## 2021-06-26 DIAGNOSIS — R41841 Cognitive communication deficit: Secondary | ICD-10-CM | POA: Diagnosis not present

## 2021-06-26 DIAGNOSIS — G40909 Epilepsy, unspecified, not intractable, without status epilepticus: Secondary | ICD-10-CM | POA: Diagnosis not present

## 2021-06-26 DIAGNOSIS — M6281 Muscle weakness (generalized): Secondary | ICD-10-CM | POA: Diagnosis not present

## 2021-06-26 DIAGNOSIS — S72002D Fracture of unspecified part of neck of left femur, subsequent encounter for closed fracture with routine healing: Secondary | ICD-10-CM | POA: Diagnosis not present

## 2021-07-01 DIAGNOSIS — Z20822 Contact with and (suspected) exposure to covid-19: Secondary | ICD-10-CM | POA: Diagnosis not present

## 2021-07-03 DIAGNOSIS — G40909 Epilepsy, unspecified, not intractable, without status epilepticus: Secondary | ICD-10-CM | POA: Diagnosis not present

## 2021-07-03 DIAGNOSIS — M62422 Contracture of muscle, left upper arm: Secondary | ICD-10-CM | POA: Diagnosis not present

## 2021-07-03 DIAGNOSIS — G8110 Spastic hemiplegia affecting unspecified side: Secondary | ICD-10-CM | POA: Diagnosis not present

## 2021-07-03 DIAGNOSIS — R41841 Cognitive communication deficit: Secondary | ICD-10-CM | POA: Diagnosis not present

## 2021-07-03 DIAGNOSIS — M6281 Muscle weakness (generalized): Secondary | ICD-10-CM | POA: Diagnosis not present

## 2021-07-03 DIAGNOSIS — S72002D Fracture of unspecified part of neck of left femur, subsequent encounter for closed fracture with routine healing: Secondary | ICD-10-CM | POA: Diagnosis not present

## 2021-07-08 DIAGNOSIS — M6281 Muscle weakness (generalized): Secondary | ICD-10-CM | POA: Diagnosis not present

## 2021-07-08 DIAGNOSIS — M62422 Contracture of muscle, left upper arm: Secondary | ICD-10-CM | POA: Diagnosis not present

## 2021-07-08 DIAGNOSIS — R41841 Cognitive communication deficit: Secondary | ICD-10-CM | POA: Diagnosis not present

## 2021-07-08 DIAGNOSIS — G40909 Epilepsy, unspecified, not intractable, without status epilepticus: Secondary | ICD-10-CM | POA: Diagnosis not present

## 2021-07-08 DIAGNOSIS — G8110 Spastic hemiplegia affecting unspecified side: Secondary | ICD-10-CM | POA: Diagnosis not present

## 2021-07-08 DIAGNOSIS — S72002D Fracture of unspecified part of neck of left femur, subsequent encounter for closed fracture with routine healing: Secondary | ICD-10-CM | POA: Diagnosis not present

## 2021-07-09 DIAGNOSIS — M62422 Contracture of muscle, left upper arm: Secondary | ICD-10-CM | POA: Diagnosis not present

## 2021-07-09 DIAGNOSIS — M6281 Muscle weakness (generalized): Secondary | ICD-10-CM | POA: Diagnosis not present

## 2021-07-09 DIAGNOSIS — G8110 Spastic hemiplegia affecting unspecified side: Secondary | ICD-10-CM | POA: Diagnosis not present

## 2021-07-09 DIAGNOSIS — E1159 Type 2 diabetes mellitus with other circulatory complications: Secondary | ICD-10-CM | POA: Diagnosis not present

## 2021-07-09 DIAGNOSIS — S72002D Fracture of unspecified part of neck of left femur, subsequent encounter for closed fracture with routine healing: Secondary | ICD-10-CM | POA: Diagnosis not present

## 2021-07-09 DIAGNOSIS — E7800 Pure hypercholesterolemia, unspecified: Secondary | ICD-10-CM | POA: Diagnosis not present

## 2021-07-09 DIAGNOSIS — I10 Essential (primary) hypertension: Secondary | ICD-10-CM | POA: Diagnosis not present

## 2021-07-09 DIAGNOSIS — G40909 Epilepsy, unspecified, not intractable, without status epilepticus: Secondary | ICD-10-CM | POA: Diagnosis not present

## 2021-07-09 DIAGNOSIS — R41841 Cognitive communication deficit: Secondary | ICD-10-CM | POA: Diagnosis not present

## 2021-07-09 DIAGNOSIS — I4821 Permanent atrial fibrillation: Secondary | ICD-10-CM | POA: Diagnosis not present

## 2021-07-14 DIAGNOSIS — E119 Type 2 diabetes mellitus without complications: Secondary | ICD-10-CM | POA: Diagnosis not present

## 2021-07-14 DIAGNOSIS — E782 Mixed hyperlipidemia: Secondary | ICD-10-CM | POA: Diagnosis not present

## 2021-07-14 DIAGNOSIS — I10 Essential (primary) hypertension: Secondary | ICD-10-CM | POA: Diagnosis not present

## 2021-07-17 ENCOUNTER — Other Ambulatory Visit: Payer: Self-pay | Admitting: Adult Health

## 2021-07-17 DIAGNOSIS — E119 Type 2 diabetes mellitus without complications: Secondary | ICD-10-CM | POA: Diagnosis not present

## 2021-07-17 DIAGNOSIS — Z7901 Long term (current) use of anticoagulants: Secondary | ICD-10-CM | POA: Diagnosis not present

## 2021-07-19 DIAGNOSIS — Z20822 Contact with and (suspected) exposure to covid-19: Secondary | ICD-10-CM | POA: Diagnosis not present

## 2021-07-20 DIAGNOSIS — Z20822 Contact with and (suspected) exposure to covid-19: Secondary | ICD-10-CM | POA: Diagnosis not present

## 2021-07-22 DIAGNOSIS — Z20822 Contact with and (suspected) exposure to covid-19: Secondary | ICD-10-CM | POA: Diagnosis not present

## 2021-07-26 DIAGNOSIS — I480 Paroxysmal atrial fibrillation: Secondary | ICD-10-CM | POA: Diagnosis not present

## 2021-07-26 DIAGNOSIS — M24522 Contracture, left elbow: Secondary | ICD-10-CM | POA: Diagnosis not present

## 2021-07-26 DIAGNOSIS — G40909 Epilepsy, unspecified, not intractable, without status epilepticus: Secondary | ICD-10-CM | POA: Diagnosis not present

## 2021-07-26 DIAGNOSIS — N3281 Overactive bladder: Secondary | ICD-10-CM | POA: Diagnosis not present

## 2021-07-26 DIAGNOSIS — R001 Bradycardia, unspecified: Secondary | ICD-10-CM | POA: Diagnosis not present

## 2021-07-26 DIAGNOSIS — E782 Mixed hyperlipidemia: Secondary | ICD-10-CM | POA: Diagnosis not present

## 2021-07-26 DIAGNOSIS — E1165 Type 2 diabetes mellitus with hyperglycemia: Secondary | ICD-10-CM | POA: Diagnosis not present

## 2021-07-26 DIAGNOSIS — R634 Abnormal weight loss: Secondary | ICD-10-CM | POA: Diagnosis not present

## 2021-07-26 DIAGNOSIS — I10 Essential (primary) hypertension: Secondary | ICD-10-CM | POA: Diagnosis not present

## 2021-07-26 DIAGNOSIS — G8190 Hemiplegia, unspecified affecting unspecified side: Secondary | ICD-10-CM | POA: Diagnosis not present

## 2021-07-26 DIAGNOSIS — K219 Gastro-esophageal reflux disease without esophagitis: Secondary | ICD-10-CM | POA: Diagnosis not present

## 2021-08-14 DIAGNOSIS — E119 Type 2 diabetes mellitus without complications: Secondary | ICD-10-CM | POA: Diagnosis not present

## 2021-08-14 DIAGNOSIS — E782 Mixed hyperlipidemia: Secondary | ICD-10-CM | POA: Diagnosis not present

## 2021-08-14 DIAGNOSIS — I10 Essential (primary) hypertension: Secondary | ICD-10-CM | POA: Diagnosis not present

## 2021-09-10 ENCOUNTER — Ambulatory Visit (INDEPENDENT_AMBULATORY_CARE_PROVIDER_SITE_OTHER): Payer: Medicare Other

## 2021-09-10 DIAGNOSIS — I495 Sick sinus syndrome: Secondary | ICD-10-CM | POA: Diagnosis not present

## 2021-09-10 LAB — CUP PACEART REMOTE DEVICE CHECK
Battery Remaining Longevity: 103 mo
Battery Remaining Percentage: 93 %
Battery Voltage: 3.01 V
Brady Statistic AP VP Percent: 1 %
Brady Statistic AP VS Percent: 93 %
Brady Statistic AS VP Percent: 1 %
Brady Statistic AS VS Percent: 7.1 %
Brady Statistic RA Percent Paced: 87 %
Brady Statistic RV Percent Paced: 1.3 %
Date Time Interrogation Session: 20230627020012
Implantable Lead Implant Date: 20220608
Implantable Lead Implant Date: 20220608
Implantable Lead Location: 753859
Implantable Lead Location: 753860
Implantable Pulse Generator Implant Date: 20220608
Lead Channel Impedance Value: 480 Ohm
Lead Channel Impedance Value: 560 Ohm
Lead Channel Pacing Threshold Amplitude: 0.5 V
Lead Channel Pacing Threshold Amplitude: 0.75 V
Lead Channel Pacing Threshold Pulse Width: 0.5 ms
Lead Channel Pacing Threshold Pulse Width: 0.5 ms
Lead Channel Sensing Intrinsic Amplitude: 12 mV
Lead Channel Sensing Intrinsic Amplitude: 5 mV
Lead Channel Setting Pacing Amplitude: 2 V
Lead Channel Setting Pacing Amplitude: 2.5 V
Lead Channel Setting Pacing Pulse Width: 0.5 ms
Lead Channel Setting Sensing Sensitivity: 2 mV
Pulse Gen Model: 2272
Pulse Gen Serial Number: 6462574

## 2021-10-02 NOTE — Progress Notes (Signed)
Remote pacemaker transmission.   

## 2021-10-08 ENCOUNTER — Ambulatory Visit: Payer: Medicare Other | Admitting: Orthopedic Surgery

## 2021-10-25 DIAGNOSIS — E1165 Type 2 diabetes mellitus with hyperglycemia: Secondary | ICD-10-CM | POA: Diagnosis not present

## 2021-10-25 DIAGNOSIS — E782 Mixed hyperlipidemia: Secondary | ICD-10-CM | POA: Diagnosis not present

## 2021-10-30 DIAGNOSIS — Z0001 Encounter for general adult medical examination with abnormal findings: Secondary | ICD-10-CM | POA: Diagnosis not present

## 2021-10-30 DIAGNOSIS — E1165 Type 2 diabetes mellitus with hyperglycemia: Secondary | ICD-10-CM | POA: Diagnosis not present

## 2021-10-30 DIAGNOSIS — G8190 Hemiplegia, unspecified affecting unspecified side: Secondary | ICD-10-CM | POA: Diagnosis not present

## 2021-10-30 DIAGNOSIS — K219 Gastro-esophageal reflux disease without esophagitis: Secondary | ICD-10-CM | POA: Diagnosis not present

## 2021-10-30 DIAGNOSIS — G40909 Epilepsy, unspecified, not intractable, without status epilepticus: Secondary | ICD-10-CM | POA: Diagnosis not present

## 2021-10-30 DIAGNOSIS — I480 Paroxysmal atrial fibrillation: Secondary | ICD-10-CM | POA: Diagnosis not present

## 2021-10-30 DIAGNOSIS — I10 Essential (primary) hypertension: Secondary | ICD-10-CM | POA: Diagnosis not present

## 2021-10-30 DIAGNOSIS — E782 Mixed hyperlipidemia: Secondary | ICD-10-CM | POA: Diagnosis not present

## 2021-10-30 DIAGNOSIS — N3281 Overactive bladder: Secondary | ICD-10-CM | POA: Diagnosis not present

## 2021-10-30 DIAGNOSIS — M24522 Contracture, left elbow: Secondary | ICD-10-CM | POA: Diagnosis not present

## 2021-10-30 DIAGNOSIS — R001 Bradycardia, unspecified: Secondary | ICD-10-CM | POA: Diagnosis not present

## 2021-11-05 ENCOUNTER — Other Ambulatory Visit (HOSPITAL_COMMUNITY): Payer: Self-pay | Admitting: Internal Medicine

## 2021-11-05 DIAGNOSIS — Z1231 Encounter for screening mammogram for malignant neoplasm of breast: Secondary | ICD-10-CM

## 2021-11-14 DIAGNOSIS — I10 Essential (primary) hypertension: Secondary | ICD-10-CM | POA: Diagnosis not present

## 2021-11-14 DIAGNOSIS — E1165 Type 2 diabetes mellitus with hyperglycemia: Secondary | ICD-10-CM | POA: Diagnosis not present

## 2021-11-14 DIAGNOSIS — E782 Mixed hyperlipidemia: Secondary | ICD-10-CM | POA: Diagnosis not present

## 2021-12-10 ENCOUNTER — Ambulatory Visit (INDEPENDENT_AMBULATORY_CARE_PROVIDER_SITE_OTHER): Payer: Medicare Other

## 2021-12-10 DIAGNOSIS — I495 Sick sinus syndrome: Secondary | ICD-10-CM | POA: Diagnosis not present

## 2021-12-11 LAB — CUP PACEART REMOTE DEVICE CHECK
Battery Remaining Longevity: 101 mo
Battery Remaining Percentage: 91 %
Battery Voltage: 3.01 V
Brady Statistic AP VP Percent: 1 %
Brady Statistic AP VS Percent: 93 %
Brady Statistic AS VP Percent: 1 %
Brady Statistic AS VS Percent: 6.8 %
Brady Statistic RA Percent Paced: 87 %
Brady Statistic RV Percent Paced: 1.2 %
Date Time Interrogation Session: 20230926020013
Implantable Lead Implant Date: 20220608
Implantable Lead Implant Date: 20220608
Implantable Lead Location: 753859
Implantable Lead Location: 753860
Implantable Pulse Generator Implant Date: 20220608
Lead Channel Impedance Value: 480 Ohm
Lead Channel Impedance Value: 560 Ohm
Lead Channel Pacing Threshold Amplitude: 0.5 V
Lead Channel Pacing Threshold Amplitude: 0.75 V
Lead Channel Pacing Threshold Pulse Width: 0.5 ms
Lead Channel Pacing Threshold Pulse Width: 0.5 ms
Lead Channel Sensing Intrinsic Amplitude: 12 mV
Lead Channel Sensing Intrinsic Amplitude: 5 mV
Lead Channel Setting Pacing Amplitude: 2 V
Lead Channel Setting Pacing Amplitude: 2.5 V
Lead Channel Setting Pacing Pulse Width: 0.5 ms
Lead Channel Setting Sensing Sensitivity: 2 mV
Pulse Gen Model: 2272
Pulse Gen Serial Number: 6462574

## 2021-12-13 ENCOUNTER — Ambulatory Visit (HOSPITAL_COMMUNITY)
Admission: RE | Admit: 2021-12-13 | Discharge: 2021-12-13 | Disposition: A | Discharge status: Home / Self Care | Payer: Medicare Other | Source: Ambulatory Visit | Attending: Internal Medicine | Admitting: Internal Medicine

## 2021-12-13 DIAGNOSIS — Z1231 Encounter for screening mammogram for malignant neoplasm of breast: Secondary | ICD-10-CM | POA: Diagnosis not present

## 2021-12-14 DIAGNOSIS — E782 Mixed hyperlipidemia: Secondary | ICD-10-CM | POA: Diagnosis not present

## 2021-12-14 DIAGNOSIS — E1165 Type 2 diabetes mellitus with hyperglycemia: Secondary | ICD-10-CM | POA: Diagnosis not present

## 2021-12-14 DIAGNOSIS — I10 Essential (primary) hypertension: Secondary | ICD-10-CM | POA: Diagnosis not present

## 2021-12-19 NOTE — Progress Notes (Signed)
Remote pacemaker transmission.   

## 2021-12-31 ENCOUNTER — Encounter: Payer: Self-pay | Admitting: Internal Medicine

## 2021-12-31 ENCOUNTER — Ambulatory Visit: Payer: Medicare Other | Attending: Internal Medicine | Admitting: Internal Medicine

## 2021-12-31 VITALS — BP 136/66 | HR 68 | Ht 63.0 in | Wt 135.0 lb

## 2021-12-31 DIAGNOSIS — I4821 Permanent atrial fibrillation: Secondary | ICD-10-CM | POA: Insufficient documentation

## 2021-12-31 LAB — CUP PACEART INCLINIC DEVICE CHECK
Battery Remaining Longevity: 105 mo
Battery Voltage: 3.01 V
Brady Statistic RA Percent Paced: 87 %
Brady Statistic RV Percent Paced: 1.1 %
Date Time Interrogation Session: 20231017153542
Implantable Lead Implant Date: 20220608
Implantable Lead Implant Date: 20220608
Implantable Lead Location: 753859
Implantable Lead Location: 753860
Implantable Pulse Generator Implant Date: 20220608
Lead Channel Impedance Value: 475 Ohm
Lead Channel Impedance Value: 600 Ohm
Lead Channel Pacing Threshold Amplitude: 0.5 V
Lead Channel Pacing Threshold Amplitude: 0.5 V
Lead Channel Pacing Threshold Amplitude: 0.75 V
Lead Channel Pacing Threshold Amplitude: 0.75 V
Lead Channel Pacing Threshold Pulse Width: 0.5 ms
Lead Channel Pacing Threshold Pulse Width: 0.5 ms
Lead Channel Pacing Threshold Pulse Width: 0.5 ms
Lead Channel Pacing Threshold Pulse Width: 0.5 ms
Lead Channel Sensing Intrinsic Amplitude: 12 mV
Lead Channel Sensing Intrinsic Amplitude: 5 mV
Lead Channel Setting Pacing Amplitude: 2 V
Lead Channel Setting Pacing Amplitude: 2.5 V
Lead Channel Setting Pacing Pulse Width: 0.5 ms
Lead Channel Setting Sensing Sensitivity: 2 mV
Pulse Gen Model: 2272
Pulse Gen Serial Number: 6462574

## 2021-12-31 NOTE — Progress Notes (Signed)
HPI Ms. Overdorf returns today for evaluation of symptomatic tach-brady syndrome. The patient is a pleasant 76yo woman with spells of dizziness and sob. Evaluation demonstrated preserved LV function and a heart monitor demonstrated atrial fib and flutter with a RVR as well as pauses of almost 4 seconds and daytime sinus bradycardia. She has not had frank syncope but has had multiple spells where she will get dizzy. She underwent PPM insertion almost 2 years ago with a [employer] DDD PM inserted. In the interim she notes she has been stable with no chest pain or sob. she has rare palpitations on low dose amiodarone. Allergies  Allergen Reactions   Lactose Intolerance (Gi) Other (See Comments)    Causes Gas in patient.     Current Outpatient Medications  Medication Sig Dispense Refill   A&D OINT at bedtime. to bilateral heels qhs for prevention     acetaminophen (TYLENOL) 650 MG CR tablet Take 650 mg by mouth 3 (three) times daily.     amiodarone (PACERONE) 200 MG tablet Take 0.5 tablets (100 mg total) by mouth daily. 15 tablet 0   amLODipine (NORVASC) 5 MG tablet Take 1 tablet (5 mg total) by mouth daily. 30 tablet 0   apixaban (ELIQUIS) 2.5 MG TABS tablet Take 1 tablet (2.5 mg total) by mouth 2 (two) times daily. 60 tablet 0   atorvastatin (LIPITOR) 80 MG tablet Take 1 tablet (80 mg total) by mouth daily at 6 PM. 30 tablet 0   Balsam Peru-Castor Oil (VENELEX) OINT Apply 1 application topically as directed. Apply to sacrum and coccyx qshift for prevention. Every Shift; Day, Evening, Night     bisacodyl (DULCOLAX) 10 MG suppository Place 10 mg rectally as needed for moderate constipation.     Ensure (ENSURE) Take 237 mLs by mouth daily.     ferrous sulfate 325 (65 FE) MG EC tablet Take 1 tablet (325 mg total) by mouth daily with breakfast. 30 tablet 0   lactase (LACTAID) 3000 units tablet Take 3,000 Units by mouth as needed.     levETIRAcetam (KEPPRA) 500 MG tablet Take 1 tablet (500 mg  total) by mouth 2 (two) times daily. 60 tablet 0   metoprolol tartrate (LOPRESSOR) 25 MG tablet Take 0.5 tablets (12.5 mg total) by mouth 2 (two) times daily. 30 tablet 0   NON FORMULARY Diet: Regular     senna-docusate (SENOKOT-S) 8.6-50 MG tablet Take 1 tablet by mouth 2 (two) times daily. 10 am and 10 pm     No current facility-administered medications for this visit.     Past Medical History:  Diagnosis Date   Atrial fibrillation [facility])    Cerebrovascular accident (CVA) with involvement of left side of body ([place]) 09/20/2018   High cholesterol    Hypertension    Seizures (HCC)    Type 2 diabetes mellitus ([place])     ROS:   All systems reviewed and negative except as noted in the HPI.   Past Surgical History:  Procedure Laterality Date   ABDOMINAL HYSTERECTOMY     COLONOSCOPY WITH PROPOFOL N/A 10/20/2020   Procedure: COLONOSCOPY WITH PROPOFOL;  Surgeon: Rogene Houston, MD;  Location: [facility] ENDO SU[facility];  Service: Endoscopy;  Laterality: N/A;   ESOPHAGOGASTRODUODENOSCOPY (EGD) WITH PROPOFOL N/A 10/20/2020   Procedure: ESOPHAGOGASTRODUODENOSCOPY (EGD) WITH PROPOFOL;  Surgeon: Rogene Houston, MD;  Location: [facility] ENDO SUITE;  Service: Endoscopy;  Laterality: N/A;   HIP ARTHROPLASTY Left 10/10/2020   Procedure: ARTHROPLASTY BIPOLAR HIP (  HEMIARTHROPLASTY);  Surgeon: Oliver Barre, MD;  Location: [facility] ORS;  Service: Orthopedics;  Laterality: Left;   PACEMAKER IMPLANT N/A 08/22/2020   Procedure: PACEMAKER IMPLANT;  Surgeon: Marinus Maw, MD;  Location: [facility] INVASIVE CV LAB;  Service: Cardiovascular;  Laterality: N/A;   TUBAL LIGATION       Family History  Problem Relation Age of Onset   Stroke Mother    Heart attack Father    Stroke Sister    Heart attack Sister      Social History   Socioeconomic History   Marital status: Divorced    Spouse name: Not on file   Number of children: Not on file   Years of education: Not on file   Highest education level: Not on file  Occupational  History   Not on file  Tobacco Use   Smoking status: Former    Types: Cigarettes    Quit date: 04/14/1988    Years since quitting: 33.7   Smokeless tobacco: Never  Vaping Use   Vaping Use: Never used  Substance and Sexual Activity   Alcohol use: Never   Drug use: Never   Sexual activity: Not on file  Other Topics Concern   Not on file  Social History Narrative   Not on file   Social Determinants of Health   Financial Resource Strain: Not on file  Food Insecurity: Not on file  Transportation Needs: Not on file  Physical Activity: Not on file  Stress: Not on file  Social Connections: Not on file  Intimate Partner Violence: Not on file     BP 136/66   Pulse 68   Ht 5\' 3"  (1.6 m)   Wt 135 lb (61.2 kg)   SpO2 98%   BMI 23.91 kg/m   Physical Exam:  Well appearing NAD HEENT: Unremarkable Neck:  No JVD, no thyromegally Lymphatics:  No adenopathy Back:  No CVA tenderness Lungs:  Clear with no wheezes HEART:  Regular rate rhythm, no murmurs, no rubs, no clicks Abd:  soft, positive bowel sounds, no organomegally, no rebound, no guarding Ext:  2 plus pulses, no edema, no cyanosis, no clubbing Skin:  No rashes no nodules Neuro:  CN II through XII intact, motor grossly intact  EKG - nsr with atrial pacing  DEVICE  Normal device function.  See PaceArt for details.   Assess/Plan:   1. Symptomatic tachy brady syndrome - she has been asymptomatic s/p PPM insertion 2. Atrial fib - she will continue low dose amio. If her burden were to increase over 10%, I would suggest increasing the dose of amiodarone. 3. Atrial flutter - continue amio. I would not recommend ablation at this time for either the flutter or the fib. 4. Coags - she will continue eliquis.    Taylor,MD

## 2021-12-31 NOTE — Patient Instructions (Signed)
Medication Instructions:  Your physician recommends that you continue on your current medications as directed. Please refer to the Current Medication list given to you today.  *If you need a refill on your cardiac medications before your next appointment, please call your pharmacy*   Lab Work: NONE   If you have labs (blood work) drawn today and your tests are completely normal, you will receive your results only by: MyChart Message (if you have MyChart) OR A paper copy in the mail If you have any lab test that is abnormal or we need to change your treatment, we will call you to review the results.   Testing/Procedures: NONE    Follow-Up: At [facility], you and your health needs are our priority.  As part of our continuing mission to provide you with exceptional heart care, we have created designated Provider Care Teams.  These Care Teams include your primary Cardiologist (physician) and Advanced Practice Providers (APPs -  Physician Assistants and Nurse Practitioners) who all work together to provide you with the care you need, when you need it.  We recommend signing up for the patient portal called "MyChart".  Sign up information is provided on this After Visit Summary.  MyChart is used to connect with patients for Virtual Visits (Telemedicine).  Patients are able to view lab/test results, encounter notes, upcoming appointments, etc.  Non-urgent messages can be sent to your provider as well.   To learn more about what you can do with MyChart, go to [email or web address].    Your next appointment:   1 year(s)  The format for your next appointment:   In Person  Provider:   Gregg Taylor, MD    Other Instructions Thank you for choosing [facility]!    Important Information About Sugar       

## 2022-02-14 DIAGNOSIS — Z23 Encounter for immunization: Secondary | ICD-10-CM | POA: Diagnosis not present

## 2022-02-26 DIAGNOSIS — E1165 Type 2 diabetes mellitus with hyperglycemia: Secondary | ICD-10-CM | POA: Diagnosis not present

## 2022-02-26 DIAGNOSIS — E782 Mixed hyperlipidemia: Secondary | ICD-10-CM | POA: Diagnosis not present

## 2022-03-05 DIAGNOSIS — I480 Paroxysmal atrial fibrillation: Secondary | ICD-10-CM | POA: Diagnosis not present

## 2022-03-05 DIAGNOSIS — I10 Essential (primary) hypertension: Secondary | ICD-10-CM | POA: Diagnosis not present

## 2022-03-05 DIAGNOSIS — M24522 Contracture, left elbow: Secondary | ICD-10-CM | POA: Diagnosis not present

## 2022-03-05 DIAGNOSIS — R945 Abnormal results of liver function studies: Secondary | ICD-10-CM | POA: Diagnosis not present

## 2022-03-05 DIAGNOSIS — E782 Mixed hyperlipidemia: Secondary | ICD-10-CM | POA: Diagnosis not present

## 2022-03-05 DIAGNOSIS — Z23 Encounter for immunization: Secondary | ICD-10-CM | POA: Diagnosis not present

## 2022-03-05 DIAGNOSIS — K219 Gastro-esophageal reflux disease without esophagitis: Secondary | ICD-10-CM | POA: Diagnosis not present

## 2022-03-05 DIAGNOSIS — G8194 Hemiplegia, unspecified affecting left nondominant side: Secondary | ICD-10-CM | POA: Diagnosis not present

## 2022-03-05 DIAGNOSIS — E1165 Type 2 diabetes mellitus with hyperglycemia: Secondary | ICD-10-CM | POA: Diagnosis not present

## 2022-03-05 DIAGNOSIS — R001 Bradycardia, unspecified: Secondary | ICD-10-CM | POA: Diagnosis not present

## 2022-03-05 DIAGNOSIS — N3281 Overactive bladder: Secondary | ICD-10-CM | POA: Diagnosis not present

## 2022-03-05 DIAGNOSIS — G40909 Epilepsy, unspecified, not intractable, without status epilepticus: Secondary | ICD-10-CM | POA: Diagnosis not present

## 2022-03-11 ENCOUNTER — Ambulatory Visit (INDEPENDENT_AMBULATORY_CARE_PROVIDER_SITE_OTHER): Payer: Medicare Other

## 2022-03-11 DIAGNOSIS — I495 Sick sinus syndrome: Secondary | ICD-10-CM | POA: Diagnosis not present

## 2022-03-11 LAB — CUP PACEART REMOTE DEVICE CHECK
Battery Remaining Longevity: 99 mo
Battery Remaining Percentage: 88 %
Battery Voltage: 3.01 V
Brady Statistic AP VP Percent: 1 %
Brady Statistic AP VS Percent: 94 %
Brady Statistic AS VP Percent: 1 %
Brady Statistic AS VS Percent: 6.4 %
Brady Statistic RA Percent Paced: 87 %
Brady Statistic RV Percent Paced: 1 %
Date Time Interrogation Session: 20231226020013
Implantable Lead Connection Status: 753985
Implantable Lead Connection Status: 753985
Implantable Lead Implant Date: 20220608
Implantable Lead Implant Date: 20220608
Implantable Lead Location: 753859
Implantable Lead Location: 753860
Implantable Pulse Generator Implant Date: 20220608
Lead Channel Impedance Value: 480 Ohm
Lead Channel Impedance Value: 610 Ohm
Lead Channel Pacing Threshold Amplitude: 0.5 V
Lead Channel Pacing Threshold Amplitude: 0.75 V
Lead Channel Pacing Threshold Pulse Width: 0.5 ms
Lead Channel Pacing Threshold Pulse Width: 0.5 ms
Lead Channel Sensing Intrinsic Amplitude: 12 mV
Lead Channel Sensing Intrinsic Amplitude: 5 mV
Lead Channel Setting Pacing Amplitude: 2 V
Lead Channel Setting Pacing Amplitude: 2.5 V
Lead Channel Setting Pacing Pulse Width: 0.5 ms
Lead Channel Setting Sensing Sensitivity: 2 mV
Pulse Gen Model: 2272
Pulse Gen Serial Number: 6462574

## 2022-03-20 ENCOUNTER — Telehealth: Payer: Self-pay

## 2022-03-20 NOTE — Telephone Encounter (Signed)
Alert received from [employer]:  Alert remote reviewed. Normal device function.   Ongoing AF, AF burden is 10% of the time, on [place], good ventricular rate control.  Instructed to report persistence, sent to triage.    Per last office note:  2. Atrial fib - she will continue low dose amio. If her burden were to increase over 10%, I would suggest increasing the dose of amiodarone.   Will discuss with Dr. Lovena Le.

## 2022-03-21 NOTE — Telephone Encounter (Signed)
Discussed with Dr. Lovena Le.  He does not want to make any medication changes at this time.  No action needed.

## 2022-04-03 NOTE — Progress Notes (Signed)
Remote pacemaker transmission.   

## 2022-04-07 DIAGNOSIS — M25562 Pain in left knee: Secondary | ICD-10-CM | POA: Diagnosis not present

## 2022-04-07 DIAGNOSIS — I480 Paroxysmal atrial fibrillation: Secondary | ICD-10-CM | POA: Diagnosis not present

## 2022-04-07 DIAGNOSIS — N3281 Overactive bladder: Secondary | ICD-10-CM | POA: Diagnosis not present

## 2022-04-07 DIAGNOSIS — I10 Essential (primary) hypertension: Secondary | ICD-10-CM | POA: Diagnosis not present

## 2022-04-18 ENCOUNTER — Ambulatory Visit (INDEPENDENT_AMBULATORY_CARE_PROVIDER_SITE_OTHER): Payer: Medicare Other | Admitting: Orthopedic Surgery

## 2022-04-18 ENCOUNTER — Ambulatory Visit (INDEPENDENT_AMBULATORY_CARE_PROVIDER_SITE_OTHER): Payer: Medicare Other

## 2022-04-18 ENCOUNTER — Encounter: Payer: Self-pay | Admitting: Orthopedic Surgery

## 2022-04-18 VITALS — Ht 63.0 in | Wt 135.0 lb

## 2022-04-18 DIAGNOSIS — S72002D Fracture of unspecified part of neck of left femur, subsequent encounter for closed fracture with routine healing: Secondary | ICD-10-CM | POA: Diagnosis not present

## 2022-04-18 NOTE — Progress Notes (Signed)
Orthopaedic Postop Note  Assessment: Rachel Fox is a 77 y.o. female s/p left hip hemiarthroplasty  DOS: 10/10/2020  Plan: Rachel Fox has done well after her surgery.  I have not seen her in several months.  Radiographs obtained today look good.  There has been no change in the overall appearance.  She denies fevers, chills or drainage from her incision.  Occasional lateral hip pain.  Occasional pain in her groin.  Based on her description, this could be related to recent cold weather.  I am not concerned about anything on physical exam or on her x-rays.  This was discussed with the patient.  Provided reassurance.  She is in agreement with this plan.  She will return to clinic as needed.   Follow-up: Return if symptoms worsen or fail to improve. XR at next visit: AP pelvis, L hip  Subjective:  Chief Complaint  Patient presents with   Routine Post Op    Lt hip DOS 10/10/20    History of Present Illness: Rachel Fox is a 77 y.o. female who presents in follow-up, after surgery that was more than 18 months ago.  I have not seen her in over a year.  Overall, she is doing well.  She has noticed some occasional pains in the left groin.  Also some pain in the left thigh.  Mild discomfort over the lateral hip.  No issues with the surgical incisions.  No drainage.  No fevers or chills.  Of note, she does have a history of stroke, affecting her left side.  She wears a brace on her lower leg.  She has some "funny feelings" in the left lower leg.   Review of Systems: No fevers or chills No numbness or tingling No Chest Pain No shortness of breath   Objective: Ht 5\' 3"  (1.6 m)   Wt 135 lb (61.2 kg)   BMI 23.91 kg/m   Physical Exam:  Alert and oriented.  Seated in a wheelchair.  Left hip without deformity.  Surgical incision has healed.  No surrounding erythema or drainage.  No pain with axial loading.  Decreased sensation below the knee, which is her baseline.  I can fully  extend her knee, but she has some difficulty with active extension.  She tolerates gentle range of motion of the left hip.  No tenderness to palpation over the lateral hip.   IMAGING: I personally ordered and reviewed the following images:  AP pelvis and left hip x-rays were obtained in clinic today.  These are compared to prior x-rays.  There is a well-positioned, cemented left hip hemiarthroplasty.  No evidence of subsidence.  There is no periprosthetic lucency.  Hip joint is reduced.  No bony lesions.  Impression: Cemented left hip hemiarthroplasty in stable position.   Mordecai Rasmussen, MD 04/18/2022 12:56 PM

## 2022-04-18 NOTE — Patient Instructions (Signed)
Call if you have any further issues or questions

## 2022-04-30 DIAGNOSIS — E119 Type 2 diabetes mellitus without complications: Secondary | ICD-10-CM | POA: Diagnosis not present

## 2022-06-10 ENCOUNTER — Ambulatory Visit (INDEPENDENT_AMBULATORY_CARE_PROVIDER_SITE_OTHER): Payer: Medicare Other

## 2022-06-10 DIAGNOSIS — I495 Sick sinus syndrome: Secondary | ICD-10-CM | POA: Diagnosis not present

## 2022-06-10 LAB — CUP PACEART REMOTE DEVICE CHECK
Battery Remaining Longevity: 95 mo
Battery Remaining Percentage: 86 %
Battery Voltage: 3.01 V
Brady Statistic AP VP Percent: 1 %
Brady Statistic AP VS Percent: 95 %
Brady Statistic AS VP Percent: 1 %
Brady Statistic AS VS Percent: 5.1 %
Brady Statistic RA Percent Paced: 79 %
Brady Statistic RV Percent Paced: 2.6 %
Date Time Interrogation Session: 20240326020014
Implantable Lead Connection Status: 753985
Implantable Lead Connection Status: 753985
Implantable Lead Implant Date: 20220608
Implantable Lead Implant Date: 20220608
Implantable Lead Location: 753859
Implantable Lead Location: 753860
Implantable Pulse Generator Implant Date: 20220608
Lead Channel Impedance Value: 440 Ohm
Lead Channel Impedance Value: 590 Ohm
Lead Channel Pacing Threshold Amplitude: 0.5 V
Lead Channel Pacing Threshold Amplitude: 0.75 V
Lead Channel Pacing Threshold Pulse Width: 0.5 ms
Lead Channel Pacing Threshold Pulse Width: 0.5 ms
Lead Channel Sensing Intrinsic Amplitude: 11.8 mV
Lead Channel Sensing Intrinsic Amplitude: 5 mV
Lead Channel Setting Pacing Amplitude: 2 V
Lead Channel Setting Pacing Amplitude: 2.5 V
Lead Channel Setting Pacing Pulse Width: 0.5 ms
Lead Channel Setting Sensing Sensitivity: 2 mV
Pulse Gen Model: 2272
Pulse Gen Serial Number: 6462574

## 2022-06-10 IMAGING — MG MM DIGITAL SCREENING BILAT W/ TOMO AND CAD
8 series · 9 of 24 positions shown · non-contrast
Comparison: Previous exam(s).

CLINICAL DATA: Screening.

EXAM:
DIGITAL SCREENING BILATERAL MAMMOGRAM WITH TOMOSYNTHESIS AND CAD
TECHNIQUE: Bilateral screening digital craniocaudal and mediolateral oblique
mammograms were obtained. Bilateral screening digital breast
tomosynthesis was performed. The images were evaluated with
computer-aided detection.

[R CC synth-2D]
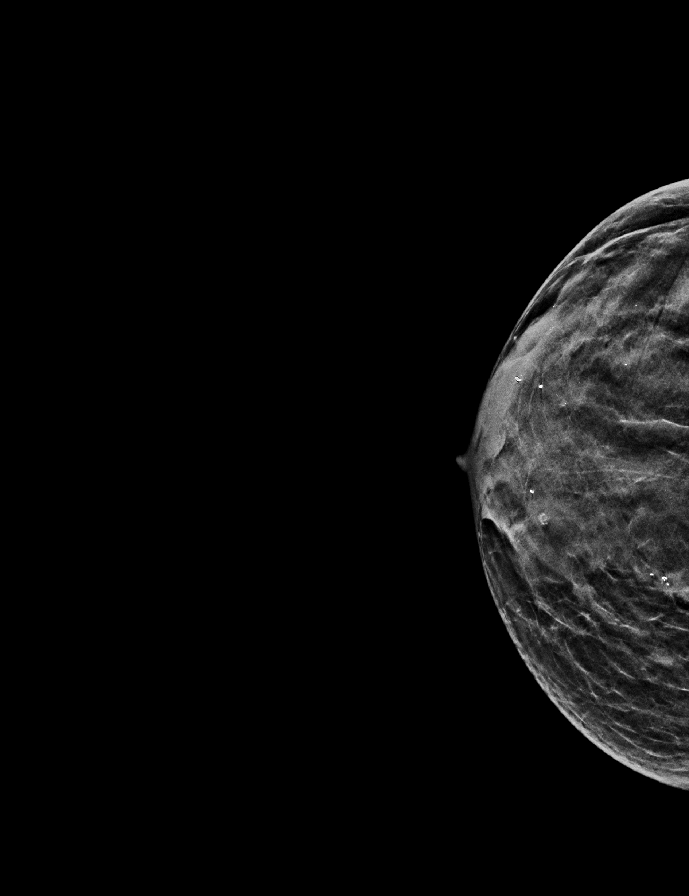

[L MLO synth-2D]
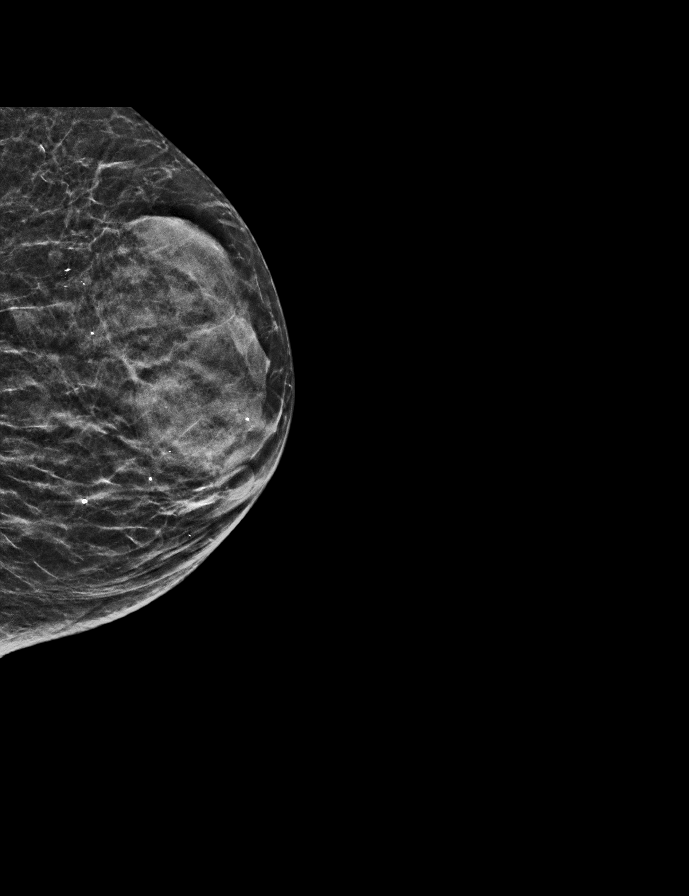

[R MLO synth-2D]
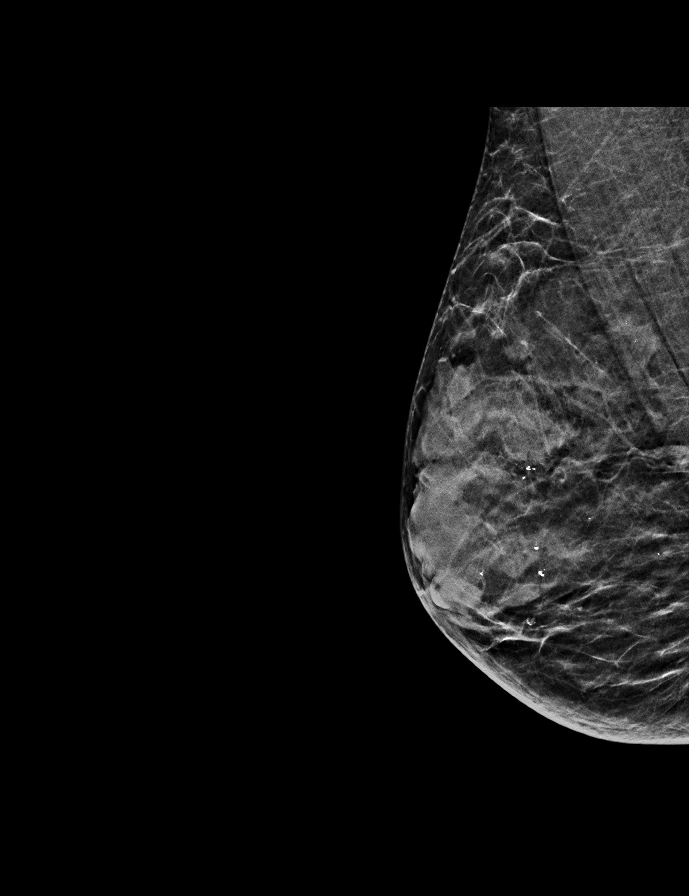

[L CC synth-2D]
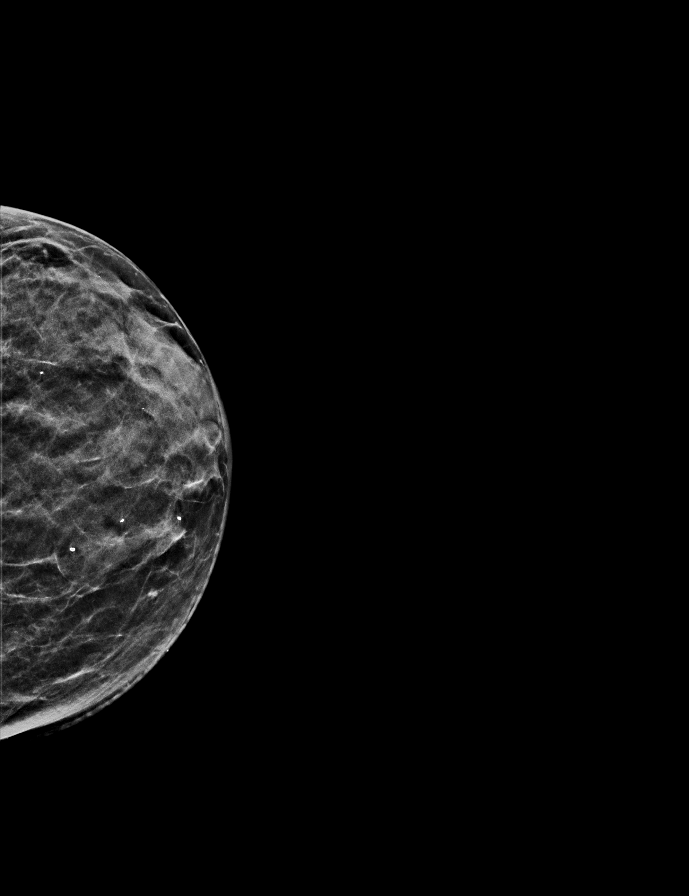

[R CC tomo · 2 of 28 frames shown]
[frame 10/28]
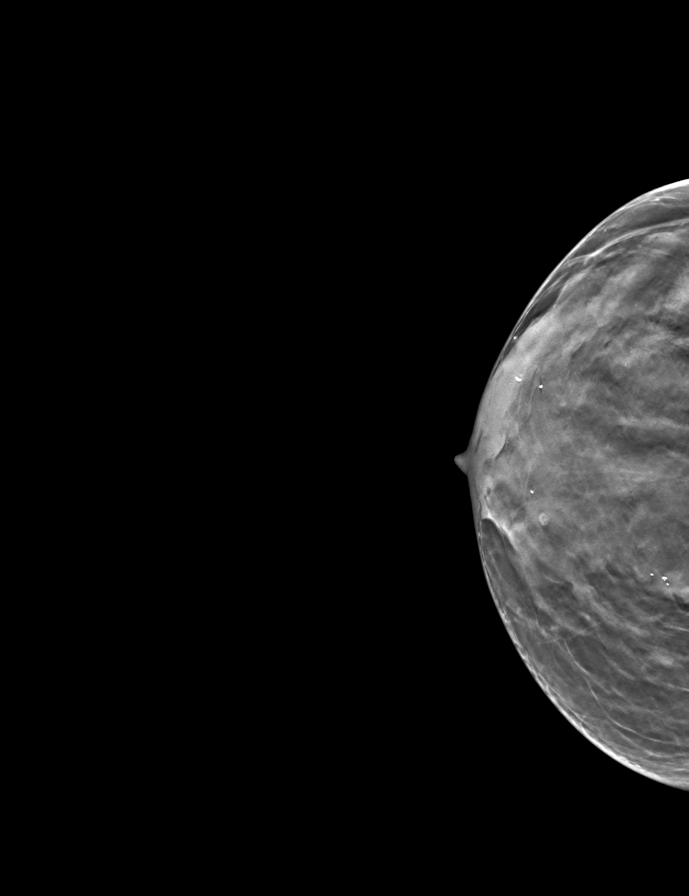
[frame 15/28]
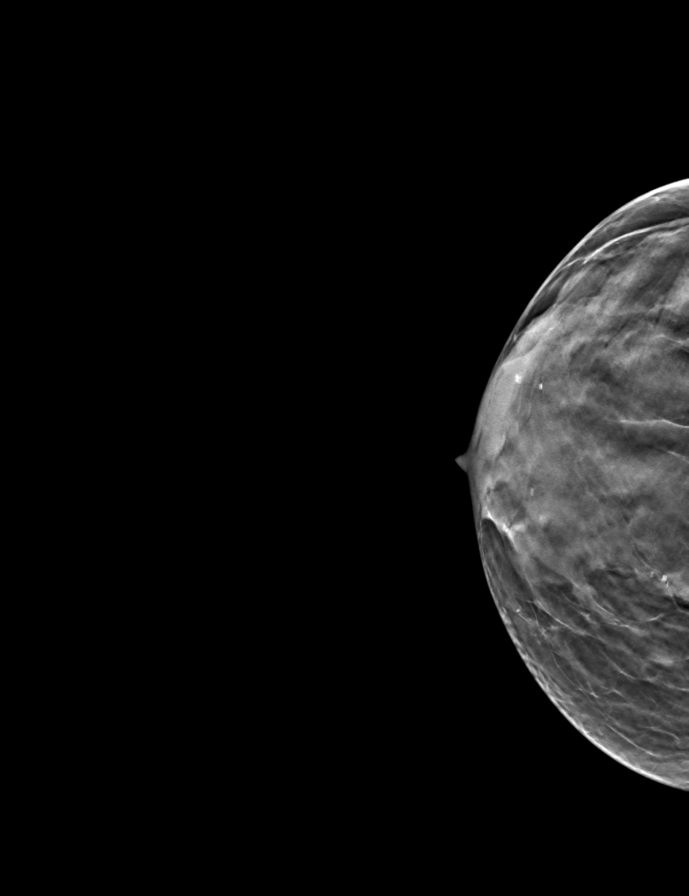

[L MLO tomo · tomo slice 20/39.0]
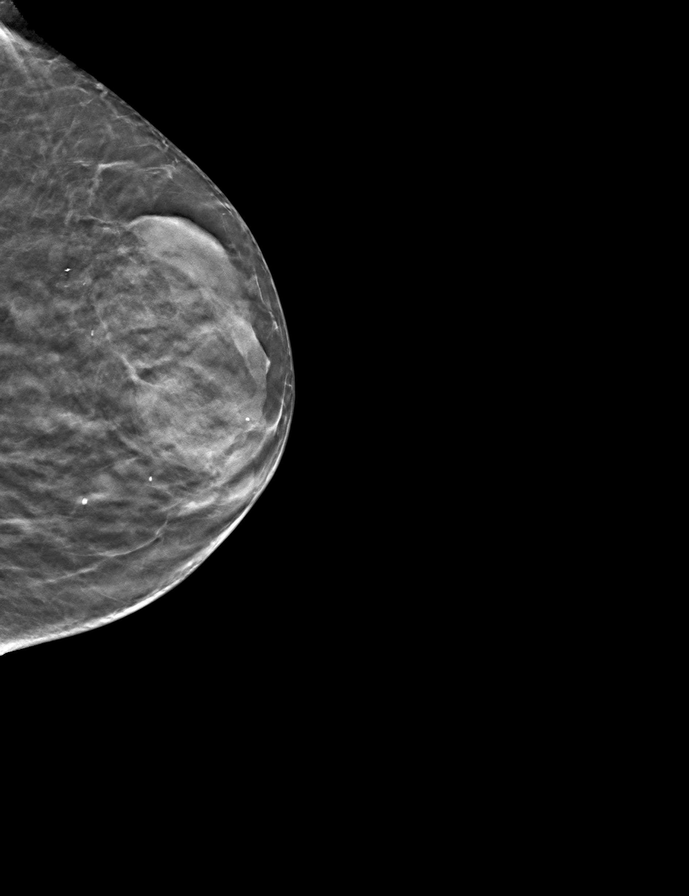

[R MLO tomo · tomo slice 17/33.0]
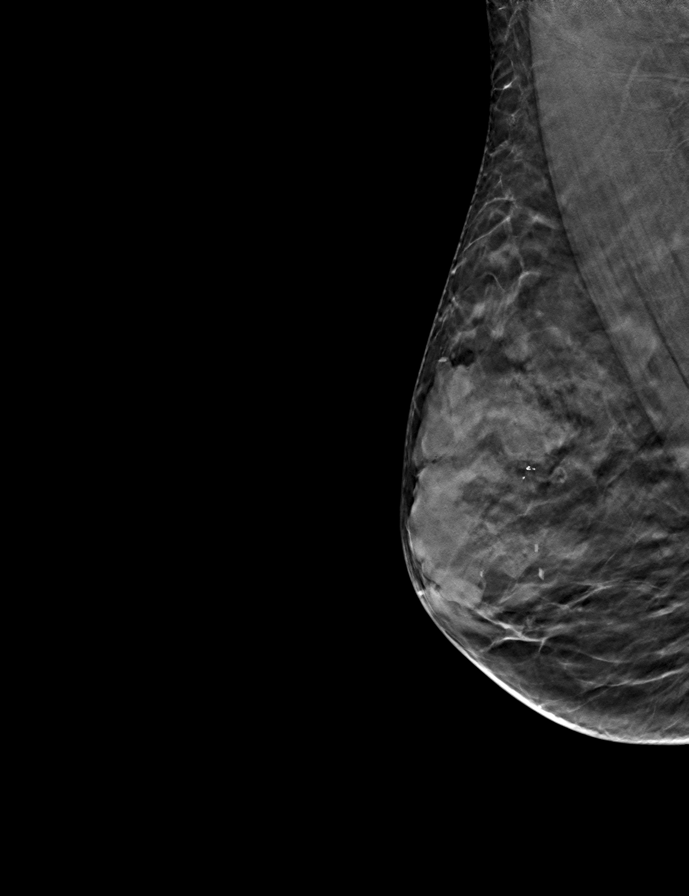

[L CC tomo · tomo slice 19/37.0]
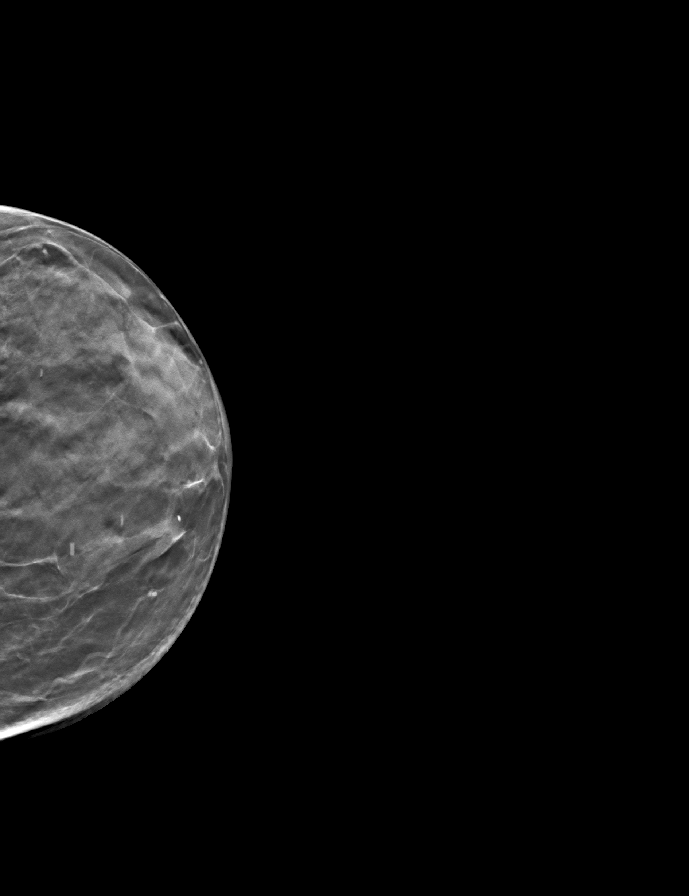

[9 of 24 positions shown; findings below may reference images not displayed]

ACR Breast Density Category c: The breast tissue is heterogeneously
dense, which may obscure small masses.
FINDINGS: There are no findings suspicious for malignancy.
IMPRESSION: No mammographic evidence of malignancy. A result letter of this
screening mammogram will be mailed directly to the patient.

RECOMMENDATION:
Screening mammogram in one year. (Code:Q3-W-BC3)

BI-RADS CATEGORY  1: Negative.

## 2022-07-22 NOTE — Progress Notes (Signed)
Remote pacemaker transmission.   

## 2022-07-30 DIAGNOSIS — E782 Mixed hyperlipidemia: Secondary | ICD-10-CM | POA: Diagnosis not present

## 2022-07-30 DIAGNOSIS — E1165 Type 2 diabetes mellitus with hyperglycemia: Secondary | ICD-10-CM | POA: Diagnosis not present

## 2022-08-01 DIAGNOSIS — Z0000 Encounter for general adult medical examination without abnormal findings: Secondary | ICD-10-CM | POA: Diagnosis not present

## 2022-08-04 DIAGNOSIS — K219 Gastro-esophageal reflux disease without esophagitis: Secondary | ICD-10-CM | POA: Diagnosis not present

## 2022-08-04 DIAGNOSIS — G8190 Hemiplegia, unspecified affecting unspecified side: Secondary | ICD-10-CM | POA: Diagnosis not present

## 2022-08-04 DIAGNOSIS — N3281 Overactive bladder: Secondary | ICD-10-CM | POA: Diagnosis not present

## 2022-08-04 DIAGNOSIS — M24522 Contracture, left elbow: Secondary | ICD-10-CM | POA: Diagnosis not present

## 2022-08-04 DIAGNOSIS — M25562 Pain in left knee: Secondary | ICD-10-CM | POA: Diagnosis not present

## 2022-08-04 DIAGNOSIS — E782 Mixed hyperlipidemia: Secondary | ICD-10-CM | POA: Diagnosis not present

## 2022-08-04 DIAGNOSIS — E1165 Type 2 diabetes mellitus with hyperglycemia: Secondary | ICD-10-CM | POA: Diagnosis not present

## 2022-08-04 DIAGNOSIS — I480 Paroxysmal atrial fibrillation: Secondary | ICD-10-CM | POA: Diagnosis not present

## 2022-08-04 DIAGNOSIS — I10 Essential (primary) hypertension: Secondary | ICD-10-CM | POA: Diagnosis not present

## 2022-08-04 DIAGNOSIS — R569 Unspecified convulsions: Secondary | ICD-10-CM | POA: Diagnosis not present

## 2022-08-04 DIAGNOSIS — R001 Bradycardia, unspecified: Secondary | ICD-10-CM | POA: Diagnosis not present

## 2022-09-09 ENCOUNTER — Ambulatory Visit: Payer: Medicare Other

## 2022-09-09 DIAGNOSIS — I495 Sick sinus syndrome: Secondary | ICD-10-CM | POA: Diagnosis not present

## 2022-09-10 LAB — CUP PACEART REMOTE DEVICE CHECK
Battery Remaining Longevity: 94 mo
Battery Remaining Percentage: 83 %
Battery Voltage: 3.01 V
Brady Statistic AP VP Percent: 1 %
Brady Statistic AP VS Percent: 96 %
Brady Statistic AS VP Percent: 1 %
Brady Statistic AS VS Percent: 4.3 %
Brady Statistic RA Percent Paced: 78 %
Brady Statistic RV Percent Paced: 3 %
Date Time Interrogation Session: 20240625020016
Implantable Lead Connection Status: 753985
Implantable Lead Connection Status: 753985
Implantable Lead Implant Date: 20220608
Implantable Lead Implant Date: 20220608
Implantable Lead Location: 753859
Implantable Lead Location: 753860
Implantable Pulse Generator Implant Date: 20220608
Lead Channel Impedance Value: 480 Ohm
Lead Channel Impedance Value: 630 Ohm
Lead Channel Pacing Threshold Amplitude: 0.5 V
Lead Channel Pacing Threshold Amplitude: 0.75 V
Lead Channel Pacing Threshold Pulse Width: 0.5 ms
Lead Channel Pacing Threshold Pulse Width: 0.5 ms
Lead Channel Sensing Intrinsic Amplitude: 12 mV
Lead Channel Sensing Intrinsic Amplitude: 5 mV
Lead Channel Setting Pacing Amplitude: 2 V
Lead Channel Setting Pacing Amplitude: 2.5 V
Lead Channel Setting Pacing Pulse Width: 0.5 ms
Lead Channel Setting Sensing Sensitivity: 2 mV
Pulse Gen Model: 2272
Pulse Gen Serial Number: 6462574

## 2022-10-01 NOTE — Progress Notes (Signed)
 Remote pacemaker transmission.   

## 2022-11-05 ENCOUNTER — Telehealth: Payer: Self-pay

## 2022-11-05 NOTE — Telephone Encounter (Signed)
Device alert for exceed AT/AF burden AF in progress for 8/11, controlled rates, burden 22%, Eliquis per PA report Route to traige for persistent AF LA, CVRS Rachel Fox due to see Dr. Ladona Ridgel 12/2022  AF burden trending up, has been AF persistently for past 9 days.  Spoke with Rachel Fox.  She is doing well, other than feeling some increased fatigue in recent weeks.  Rachel Fox is taking all of her meds including OAC, amiodarone and metoprolol.  No changes to diet health or medications.    She needs follow up appt with Dr. Ladona Ridgel in the fall.  Knows to call our office if she develops any worsening fatigue, new onset dyspnea on exertion, dizziness, palpitations etc (severe sx's to ER).

## 2022-11-05 NOTE — Telephone Encounter (Signed)
If she feels ok, let her be. If she starts to feel a lot worse, and remains in atrial fib, she can undergo DCCV.

## 2022-11-17 ENCOUNTER — Emergency Department (HOSPITAL_COMMUNITY): Payer: Medicare Other

## 2022-11-17 ENCOUNTER — Emergency Department (HOSPITAL_COMMUNITY)
Admission: EM | Admit: 2022-11-17 | Discharge: 2022-11-17 | Disposition: A | Discharge status: Home / Self Care | Payer: Medicare Other | Attending: Emergency Medicine | Admitting: Emergency Medicine

## 2022-11-17 ENCOUNTER — Encounter (HOSPITAL_COMMUNITY): Payer: Self-pay | Admitting: Emergency Medicine

## 2022-11-17 ENCOUNTER — Other Ambulatory Visit: Payer: Self-pay

## 2022-11-17 DIAGNOSIS — S32059A Unspecified fracture of fifth lumbar vertebra, initial encounter for closed fracture: Secondary | ICD-10-CM | POA: Diagnosis not present

## 2022-11-17 DIAGNOSIS — W19XXXA Unspecified fall, initial encounter: Secondary | ICD-10-CM | POA: Insufficient documentation

## 2022-11-17 DIAGNOSIS — I10 Essential (primary) hypertension: Secondary | ICD-10-CM | POA: Diagnosis not present

## 2022-11-17 DIAGNOSIS — M25551 Pain in right hip: Secondary | ICD-10-CM | POA: Diagnosis not present

## 2022-11-17 DIAGNOSIS — S32000A Wedge compression fracture of unspecified lumbar vertebra, initial encounter for closed fracture: Secondary | ICD-10-CM

## 2022-11-17 DIAGNOSIS — Z471 Aftercare following joint replacement surgery: Secondary | ICD-10-CM | POA: Diagnosis not present

## 2022-11-17 DIAGNOSIS — Z7901 Long term (current) use of anticoagulants: Secondary | ICD-10-CM | POA: Diagnosis not present

## 2022-11-17 DIAGNOSIS — M1611 Unilateral primary osteoarthritis, right hip: Secondary | ICD-10-CM | POA: Diagnosis not present

## 2022-11-17 DIAGNOSIS — S32010A Wedge compression fracture of first lumbar vertebra, initial encounter for closed fracture: Secondary | ICD-10-CM | POA: Diagnosis not present

## 2022-11-17 DIAGNOSIS — S3992XA Unspecified injury of lower back, initial encounter: Secondary | ICD-10-CM | POA: Diagnosis present

## 2022-11-17 DIAGNOSIS — M47816 Spondylosis without myelopathy or radiculopathy, lumbar region: Secondary | ICD-10-CM | POA: Diagnosis not present

## 2022-11-17 DIAGNOSIS — M545 Low back pain, unspecified: Secondary | ICD-10-CM | POA: Diagnosis not present

## 2022-11-17 DIAGNOSIS — Z79899 Other long term (current) drug therapy: Secondary | ICD-10-CM | POA: Diagnosis not present

## 2022-11-17 MED ORDER — OXYCODONE-ACETAMINOPHEN 5-325 MG PO TABS
1.0000 | ORAL_TABLET | Freq: Once | ORAL | Status: AC
  Administered 2022-11-17: 1 via ORAL
  Filled 2022-11-17: qty 1

## 2022-11-17 MED ORDER — OXYCODONE-ACETAMINOPHEN 5-325 MG PO TABS: 1.0000 | ORAL_TABLET | Freq: Four times a day (QID) | ORAL | 0 refills | Status: DC | PRN

## 2022-11-17 NOTE — ED Triage Notes (Signed)
Pt tripped and fell x 4 days ago. Pt c/o pain across lower back with movement. Pt worried about her left hip due to previous hip fx but doesn't hurt there. Denies hitting head. Nad. A/o. A fib noted with radial pulse which pt states has hx of

## 2022-11-17 NOTE — Discharge Instructions (Signed)
Follow up with your orthopedic doctor this week.

## 2022-11-17 NOTE — ED Notes (Signed)
RN and NT assisted patient to bathroom on two occasions via wheelchair. Pt was a maximum assist. Pt request son help lift due to her weakness and pain, but he refused. RN and NT transported to and back safely.

## 2022-11-18 NOTE — ED Provider Notes (Signed)
[place] [facility] AT [facility] Provider Note   CSN: [id number] Arrival date & time: 11/17/22  1012     History  Chief Complaint  Patient presents with   Rachel Fox    Rachel Fox is a 77 y.o. female.  Patient complains of lower back and right hip pain.  Patient had a fall recently.  She has a history of hypertension  The history is provided by the patient and medical records. No language interpreter was used.  Fall This is a new problem. The current episode started 2 days ago. The problem occurs rarely. The problem has not changed since onset.Pertinent negatives include no chest pain, no abdominal pain and no headaches. Nothing aggravates the symptoms. Nothing relieves the symptoms.       Home Medications Prior to Admission medications   Medication Sig Start Date End Date Taking? Authorizing Provider  oxyCODONE-acetaminophen (PERCOCET/ROXICET) 5-325 MG tablet Take 1 tablet by mouth every 6 (six) hours as needed for severe pain. 11/17/22  Yes Bethann Berkshire, MD  A&D OINT at bedtime. to bilateral heels qhs for prevention    [provider]  acetaminophen (TYLENOL) 650 MG CR tablet Take 650 mg by mouth 3 (three) times daily.    [provider]  amiodarone (PACERONE) 200 MG tablet Take 0.5 tablets (100 mg total) by mouth daily. 04/10/21   Sharee Holster, NP  amLODipine (NORVASC) 5 MG tablet Take 1 tablet (5 mg total) by mouth daily. 04/10/21   Sharee Holster, NP  apixaban (ELIQUIS) 2.5 MG TABS tablet Take 1 tablet (2.5 mg total) by mouth 2 (two) times daily. 04/10/21   Sharee Holster, NP  atorvastatin (LIPITOR) 80 MG tablet Take 1 tablet (80 mg total) by mouth daily at 6 PM. 04/10/21   Chilton Si, Chong Sicilian, NP  Lucilla Lame Peru-Castor Oil [facility]) OINT Apply 1 application topically as directed. Apply to sacrum and coccyx qshift for prevention. Every Shift; Day, Evening, Night 10/17/20   [provider]  bisacodyl (DULCOLAX) 10 MG suppository  Place 10 mg rectally as needed for moderate constipation.    [provider]  Ensure (ENSURE) Take 237 mLs by mouth daily.    [provider]  ferrous sulfate 325 (65 FE) MG EC tablet Take 1 tablet (325 mg total) by mouth daily with breakfast. 04/10/21   Sharee Holster, NP  lactase (LACTAID) 3000 units tablet Take 3,000 Units by mouth as needed.    [provider]  levETIRAcetam (KEPPRA) 500 MG tablet Take 1 tablet (500 mg total) by mouth 2 (two) times daily. 04/10/21   Sharee Holster, NP  metoprolol tartrate (LOPRESSOR) 25 MG tablet Take 0.5 tablets (12.5 mg total) by mouth 2 (two) times daily. 04/10/21   Sharee Holster, NP  NON FORMULARY Diet: Regular    [provider]  senna-docusate (SENOKOT-S) 8.6-50 MG tablet Take 1 tablet by mouth 2 (two) times daily. 10 am and 10 pm    [provider]      Allergies    Lactose intolerance (gi)    Review of Systems   Review of Systems  Constitutional:  Negative for appetite change and fatigue.  HENT:  Negative for congestion, ear discharge and sinus pressure.   Eyes:  Negative for discharge.  Respiratory:  Negative for cough.   Cardiovascular:  Negative for chest pain.  Gastrointestinal:  Negative for abdominal pain and diarrhea.  Genitourinary:  Negative for frequency and hematuria.  Musculoskeletal:  Positive  for back pain.  Skin:  Negative for rash.  Neurological:  Negative for seizures and headaches.  Psychiatric/Behavioral:  Negative for hallucinations.     Physical Exam Updated Vital Signs BP (!) 153/93 (BP Location: Right Arm)   Pulse 71   Temp 98.2 F (36.8 C) (Oral)   Resp 19   SpO2 99%  Physical Exam Vitals and nursing note reviewed.  Constitutional:      Appearance: She is well-developed.  HENT:     Head: Normocephalic.     Nose: Nose normal.  Eyes:     General: No scleral icterus.    Conjunctiva/sclera: Conjunctivae normal.  Neck:     Thyroid: No thyromegaly.   Cardiovascular:     Rate and Rhythm: Normal rate and regular rhythm.     Heart sounds: No murmur heard.    No friction rub. No gallop.  Pulmonary:     Breath sounds: No stridor. No wheezing or rales.  Chest:     Chest wall: No tenderness.  Abdominal:     General: There is no distension.     Tenderness: There is no abdominal tenderness. There is no rebound.  Musculoskeletal:        General: Tenderness present. Normal range of motion.     Cervical back: Neck supple.     Comments: Tender lumbar spine  Lymphadenopathy:     Cervical: No cervical adenopathy.  Skin:    Findings: No erythema or rash.  Neurological:     Mental Status: She is oriented to person, place, and time.     Motor: No abnormal muscle tone.     Coordination: Coordination normal.  Psychiatric:        Behavior: Behavior normal.     ED Results / Procedures / Treatments   Labs (all labs ordered are listed, but only abnormal results are displayed) Labs Reviewed - No data to display  EKG None  Radiology DG Lumbar Spine Complete  Result Date: 11/17/2022 CLINICAL DATA:  Fall 4 days ago with lower back pain. EXAM: LUMBAR SPINE - COMPLETE 4+ VIEW COMPARISON:  CT abdomen and pelvis dated 10/19/2020 FINDINGS: There is approximately 25% height loss of the L5 vertebral body which is chronic appearing, but new since 10/19/2020. There is possible 3 mm retropulsion into the central canal. Vertebral body alignment is normal. Moderate degenerative disc and joint disease is seen in the lumbar spine. IMPRESSION: Chronic appearing compression deformity of the L5 vertebral body with possible 3 mm retropulsion into the central canal which is new since 10/19/2020. No definite acute fracture. Electronically Signed   By: Romona Curls M.D.   On: 11/17/2022 12:24   DG Hip Unilat W or Wo Pelvis 2-3 Views Right  Result Date: 11/17/2022 CLINICAL DATA:  Fall 4 days ago with right hip pain. EXAM: DG HIP (WITH OR WITHOUT PELVIS) 2-3V RIGHT  COMPARISON:  Hip radiographs dated 04/18/2022. FINDINGS: The patient is status post a left hip arthroplasty. There is no evidence of hip fracture or dislocation. There is mild right hip osteoarthritis. Degenerative changes are seen in the spine. IMPRESSION: No acute osseous injury. Electronically Signed   By: Romona Curls M.D.   On: 11/17/2022 12:20    Procedures Procedures    Medications Ordered in ED Medications  oxyCODONE-acetaminophen (PERCOCET/ROXICET) 5-325 MG per tablet 1 tablet (1 tablet Oral Given 11/17/22 1443)    ED Course/ Medical Decision Making/ A&P  Medical Decision Making Amount and/or Complexity of Data Reviewed Radiology: ordered.  Risk Prescription drug management.   Patient with L5 compression fracture.  She is given Percocets and will follow-up with orthopedics       Final Clinical Impression(s) / ED Diagnoses Final diagnoses:  Lumbar compression fracture, closed, initial encounter [facility])    Rx / DC Orders ED Discharge Orders          Ordered    oxyCODONE-acetaminophen (PERCOCET/ROXICET) 5-325 MG tablet  Every 6 hours PRN        11/17/22 1431              Bethann Berkshire, MD 11/18/22 1555

## 2022-11-26 ENCOUNTER — Ambulatory Visit (INDEPENDENT_AMBULATORY_CARE_PROVIDER_SITE_OTHER): Payer: Medicare Other | Admitting: Orthopedic Surgery

## 2022-11-26 ENCOUNTER — Encounter: Payer: Self-pay | Admitting: Orthopedic Surgery

## 2022-11-26 VITALS — BP 134/78 | HR 83

## 2022-11-26 DIAGNOSIS — S32050A Wedge compression fracture of fifth lumbar vertebra, initial encounter for closed fracture: Secondary | ICD-10-CM | POA: Diagnosis not present

## 2022-11-26 MED ORDER — TRAMADOL HCL 50 MG PO TABS: 50.0000 mg | ORAL_TABLET | Freq: Four times a day (QID) | ORAL | 5 refills | Status: DC | PRN

## 2022-11-26 MED ORDER — LIDOCAINE 4 % EX PTCH: 1.0000 | MEDICATED_PATCH | CUTANEOUS | 0 refills | Status: AC

## 2022-11-26 NOTE — Progress Notes (Unsigned)
Orthopaedic Clinic Return  Assessment: Rachel Fox is a 77 y.o. female with the following: Low back pain, L5 compression fracture.   Plan: Rachel Fox has low back pain, which is improving since a fall.  Radiographs are negative, with the exception of a chronic compression deformity.  Unsure if this is chronic, but given her pain currently, this is likely an acute injury.  The nature of the injury was discussed.  This injury will take time, as well as medications as needed.  She can try a brace, and most patients are not comfortable with the brace.  Weightbearing as tolerated.  Have provided her with tramadol, as well as some pain patches.  She will return to clinic in approximately 3 weeks.  Meds ordered this encounter  Medications   traMADol (ULTRAM) 50 MG tablet    Sig: Take 1 tablet (50 mg total) by mouth every 6 (six) hours as needed.    Dispense:  60 tablet    Refill:  5   lidocaine (LIDOCAINE PAIN RELIEF) 4 %    Sig: Place 1 patch onto the skin daily.    Dispense:  30 patch    Refill:  0     Follow-up: Return in about 3 weeks (around 12/17/2022).   Subjective:  Chief Complaint  Patient presents with   Back Pain    LBP and R hip pain after a fall DOI 11/14/22   Hip Pain    History of Present Illness: Rachel Fox is a 77 y.o. female who returns to clinic for repeat evaluation of ***   Review of Systems: ***  Objective: BP 134/78   Pulse 83   Physical Exam:  ***  IMAGING: I personally ordered and reviewed the following images:   Oliver Barre, MD 11/26/2022 3:36 PM

## 2022-11-27 ENCOUNTER — Encounter: Payer: Self-pay | Admitting: Orthopedic Surgery

## 2022-11-28 DIAGNOSIS — E782 Mixed hyperlipidemia: Secondary | ICD-10-CM | POA: Diagnosis not present

## 2022-11-28 DIAGNOSIS — E1165 Type 2 diabetes mellitus with hyperglycemia: Secondary | ICD-10-CM | POA: Diagnosis not present

## 2022-12-01 ENCOUNTER — Encounter (HOSPITAL_COMMUNITY): Payer: Self-pay | Admitting: *Deleted

## 2022-12-01 ENCOUNTER — Emergency Department (HOSPITAL_COMMUNITY)
Admission: EM | Admit: 2022-12-01 | Discharge: 2022-12-01 | Disposition: A | Discharge status: Home / Self Care | Payer: Medicare Other | Attending: Emergency Medicine | Admitting: Emergency Medicine

## 2022-12-01 ENCOUNTER — Other Ambulatory Visit: Payer: Self-pay

## 2022-12-01 DIAGNOSIS — E119 Type 2 diabetes mellitus without complications: Secondary | ICD-10-CM | POA: Insufficient documentation

## 2022-12-01 DIAGNOSIS — G4489 Other headache syndrome: Secondary | ICD-10-CM | POA: Diagnosis not present

## 2022-12-01 DIAGNOSIS — I10 Essential (primary) hypertension: Secondary | ICD-10-CM | POA: Insufficient documentation

## 2022-12-01 DIAGNOSIS — R569 Unspecified convulsions: Secondary | ICD-10-CM | POA: Diagnosis present

## 2022-12-01 DIAGNOSIS — R410 Disorientation, unspecified: Secondary | ICD-10-CM | POA: Diagnosis not present

## 2022-12-01 DIAGNOSIS — Z7901 Long term (current) use of anticoagulants: Secondary | ICD-10-CM | POA: Diagnosis not present

## 2022-12-01 DIAGNOSIS — R55 Syncope and collapse: Secondary | ICD-10-CM | POA: Diagnosis not present

## 2022-12-01 DIAGNOSIS — Z79899 Other long term (current) drug therapy: Secondary | ICD-10-CM | POA: Insufficient documentation

## 2022-12-01 DIAGNOSIS — R1111 Vomiting without nausea: Secondary | ICD-10-CM | POA: Diagnosis not present

## 2022-12-01 LAB — BASIC METABOLIC PANEL
Anion gap: 11 (ref 5–15)
BUN: 16 mg/dL (ref 8–23)
CO2: 25 mmol/L (ref 22–32)
Calcium: 9.2 mg/dL (ref 8.9–10.3)
Chloride: 100 mmol/L (ref 98–111)
Creatinine, Ser: 1.28 mg/dL — ABNORMAL HIGH (ref 0.44–1.00)
GFR, Estimated: 44 mL/min — ABNORMAL LOW (ref >60)
Glucose, Bld: 132 mg/dL — ABNORMAL HIGH (ref 70–99)
Potassium: 3.7 mmol/L (ref 3.5–5.1)
Sodium: 136 mmol/L (ref 135–145)

## 2022-12-01 LAB — CBC WITH DIFFERENTIAL/PLATELET
Abs Immature Granulocytes: 0.02 10*3/uL (ref 0.00–0.07)
Basophils Absolute: 0 10*3/uL (ref 0.0–0.1)
Basophils Relative: 0 %
Eosinophils Absolute: 0.1 10*3/uL (ref 0.0–0.5)
Eosinophils Relative: 1 %
HCT: 48.9 % — ABNORMAL HIGH (ref 36.0–46.0)
Hemoglobin: 15.1 g/dL — ABNORMAL HIGH (ref 12.0–15.0)
Immature Granulocytes: 0 %
Lymphocytes Relative: 12 %
Lymphs Abs: 1.1 10*3/uL (ref 0.7–4.0)
MCH: 27.4 pg (ref 26.0–34.0)
MCHC: 30.9 g/dL (ref 30.0–36.0)
MCV: 88.7 fL (ref 80.0–100.0)
Monocytes Absolute: 0.6 10*3/uL (ref 0.1–1.0)
Monocytes Relative: 6 %
Neutro Abs: 7.4 10*3/uL (ref 1.7–7.7)
Neutrophils Relative %: 81 %
Platelets: 243 10*3/uL (ref 150–400)
RBC: 5.51 MIL/uL — ABNORMAL HIGH (ref 3.87–5.11)
RDW: 13.9 % (ref 11.5–15.5)
WBC: 9.2 10*3/uL (ref 4.0–10.5)
nRBC: 0 % (ref 0.0–0.2)

## 2022-12-01 LAB — CBG MONITORING, ED: Glucose-Capillary: 136 mg/dL — ABNORMAL HIGH (ref 70–99)

## 2022-12-01 MED ORDER — HYDROCODONE-ACETAMINOPHEN 5-325 MG PO TABS: 1.0000 | ORAL_TABLET | Freq: Four times a day (QID) | ORAL | 0 refills | Status: DC | PRN

## 2022-12-01 NOTE — ED Provider Notes (Signed)
[facility] EMERGENCY DEPARTMENT AT [facility] Provider Note   CSN: [id number] Arrival date & time: 12/01/22  1235     History  Chief Complaint  Patient presents with   Seizures    Rachel Fox is a 77 y.o. female with a history significant for seizure disorder, hypertension, type 2 diabetes, history of CVA with left-sided weakness, A-fib on Eliquis presenting for evaluation of seizure which occurred around noon today, lasting several minutes, witnessed by her home CNA.  Patient states she was walking using her walker from the bathroom back to her chair, she sat in her chair but felt very hot and got nauseated which are symptoms she usually experiences prior to his seizure.  She had a several minute episode of being unresponsive and staring which is described as a typical seizure by son at bedside.  She currently has no complaints, no nausea or vomiting, she is not confused at this time, she had no urinary or fecal incontinence.  She denies missing any doses of her Keppra, she takes 500 mg twice daily.  She was started on tramadol last Wednesday by her orthopedist for pain associated with the lumbar compression fracture from a fall that occurred on September 2.  Her last seizure prior to this 1 was Labor Day weekend.  The history is provided by the patient and a relative (son at bedside).       Home Medications Prior to Admission medications   Medication Sig Start Date End Date Taking? Authorizing Provider  HYDROcodone-acetaminophen (NORCO/VICODIN) 5-325 MG tablet Take 1 tablet by mouth every 6 (six) hours as needed. 12/01/22  Yes Burgess Amor, PA-C  A&D OINT at bedtime. to bilateral heels qhs for prevention    [provider]  acetaminophen (TYLENOL) 650 MG CR tablet Take 650 mg by mouth 3 (three) times daily.    [provider]  amiodarone (PACERONE) 200 MG tablet Take 0.5 tablets (100 mg total) by mouth daily. 04/10/21   Sharee Holster, NP  amLODipine  (NORVASC) 5 MG tablet Take 1 tablet (5 mg total) by mouth daily. 04/10/21   Sharee Holster, NP  apixaban (ELIQUIS) 2.5 MG TABS tablet Take 1 tablet (2.5 mg total) by mouth 2 (two) times daily. 04/10/21   Sharee Holster, NP  atorvastatin (LIPITOR) 80 MG tablet Take 1 tablet (80 mg total) by mouth daily at 6 PM. 04/10/21   Chilton Si, Chong Sicilian, NP  Lucilla Lame [place] [facility]) OINT Apply 1 application topically as directed. Apply to sacrum and coccyx qshift for prevention. Every Shift; Day, Evening, Night 10/17/20   [provider]  bisacodyl (DULCOLAX) 10 MG suppository Place 10 mg rectally as needed for moderate constipation.    [provider]  Ensure (ENSURE) Take 237 mLs by mouth daily.    [provider]  ferrous sulfate 325 (65 FE) MG EC tablet Take 1 tablet (325 mg total) by mouth daily with breakfast. 04/10/21   Sharee Holster, NP  lactase (LACTAID) 3000 units tablet Take 3,000 Units by mouth as needed.    [provider]  levETIRAcetam (KEPPRA) 500 MG tablet Take 1 tablet (500 mg total) by mouth 2 (two) times daily. 04/10/21   Sharee Holster, NP  lidocaine (LIDOCAINE PAIN RELIEF) 4 % Place 1 patch onto the skin daily. 11/26/22   Oliver Barre, MD  metoprolol tartrate (LOPRESSOR) 25 MG tablet Take 0.5 tablets (12.5 mg total) by mouth 2 (two) times daily. 04/10/21  Sharee Holster, NP  NON FORMULARY Diet: Regular    [provider]  senna-docusate (SENOKOT-S) 8.6-50 MG tablet Take 1 tablet by mouth 2 (two) times daily. 10 am and 10 pm    [provider]      Allergies    Lactose intolerance (gi)    Review of Systems   Review of Systems  Constitutional:  Negative for chills and fever.  HENT: Negative.    Eyes: Negative.   Respiratory:  Negative for chest tightness and shortness of breath.   Cardiovascular:  Negative for chest pain.  Gastrointestinal:  Negative for abdominal pain and nausea.  Genitourinary: Negative.    Musculoskeletal:  Positive for back pain. Negative for arthralgias, joint swelling and neck pain.  Skin: Negative.  Negative for rash and wound.  Neurological:  Positive for seizures and weakness. Negative for dizziness, light-headedness, numbness and headaches.       Left-sided weakness, predominantly left arm, held in contracture, baseline.  Psychiatric/Behavioral: Negative.    All other systems reviewed and are negative.   Physical Exam Updated Vital Signs BP 133/74   Pulse 65   Temp 97.9 F (36.6 C) (Oral)   Resp 11   SpO2 97%  Physical Exam Vitals and nursing note reviewed.  Constitutional:      Appearance: She is well-developed.  HENT:     Head: Normocephalic and atraumatic.     Salivary Glands: Tender: no mouth or tongue injury.  Eyes:     Extraocular Movements: Extraocular movements intact.     Conjunctiva/sclera: Conjunctivae normal.     Pupils: Pupils are equal, round, and reactive to light.  Cardiovascular:     Rate and Rhythm: Normal rate and regular rhythm.     Heart sounds: Normal heart sounds.  Pulmonary:     Effort: Pulmonary effort is normal.     Breath sounds: Normal breath sounds. No wheezing.  Abdominal:     General: Bowel sounds are normal.     Palpations: Abdomen is soft.     Tenderness: There is no abdominal tenderness.  Musculoskeletal:        General: Normal range of motion.     Cervical back: Normal range of motion.  Skin:    General: Skin is warm and dry.  Neurological:     Mental Status: She is alert and oriented to person, place, and time.     Cranial Nerves: No dysarthria.     Sensory: Sensation is intact.     Comments: Left arm contracture. At neuro baseline per son at bedside.  No post ictal findings.       ED Results / Procedures / Treatments   Labs (all labs ordered are listed, but only abnormal results are displayed) Labs Reviewed  BASIC METABOLIC PANEL - Abnormal; Notable for the following components:      Result Value    Glucose, Bld 132 (*)    Creatinine, Ser 1.28 (*)    GFR, Estimated 44 (*)    All other components within normal limits  CBC WITH DIFFERENTIAL/PLATELET - Abnormal; Notable for the following components:   RBC 5.51 (*)    Hemoglobin 15.1 (*)    HCT 48.9 (*)    All other components within normal limits  CBG MONITORING, ED - Abnormal; Notable for the following components:   Glucose-Capillary 136 (*)    All other components within normal limits  LEVETIRACETAM LEVEL    EKG None  Radiology No results found.  Procedures Procedures  Medications Ordered in ED Medications - No data to display  ED Course/ Medical Decision Making/ A&P                                 Medical Decision Making Patient presented for evaluation of a typical seizure which occurred this morning, patient is now back to her baseline with no postictal symptoms.  She states her seizure is similar to priors with prodrome of nausea and hot flashes.  She was recently started on tramadol for low back pain associated with the lumbar compression fracture.  She was strongly advised to discontinue this medication, instead she was prescribed Vicodin with precautions regarding sedation.  A Keppra level has been collected, but will not result while here.  Patient has follow-up already with her PCP in 2 days, she was encouraged to keep that appointment and let him know that this Keppra level has been collected.  Patient does not drive at baseline.  Amount and/or Complexity of Data Reviewed Labs: ordered.    Details: CBC and be met reviewed, her creatinine is 1.28 today which is elevated from her baseline of 0.8, advised recheck by her PCP in 2 days.  Risk Prescription drug management.           Final Clinical Impression(s) / ED Diagnoses Final diagnoses:  Seizure [facility])    Rx / DC Orders ED Discharge Orders          Ordered    HYDROcodone-acetaminophen (NORCO/VICODIN) 5-325 MG tablet  Every 6 hours PRN         12/01/22 1634              Burgess Amor, PA-C 12/01/22 1743    Loetta Rough, MD 12/03/22 1536

## 2022-12-01 NOTE — ED Triage Notes (Signed)
Pt BIB [facility] for c/o possible seizure; ems was told pt vomited x one and had some right side "shaking" and then "passed out"  Pt states she did not pass out  but states she got hot and vomited x one

## 2022-12-01 NOTE — Discharge Instructions (Addendum)
Keep your appointment with Dr. Margo Aye on Wednesday.  Let him know that we have ordered a Keppra level so that he can look for this result as you may need to have your dosing adjusted.  In the meantime do not take any more tramadol as this can cause seizure breakthrough.  I have prescribed you hydrocodone in place of tramadol for your back pain.  I also suggest you take a stool softener such as Colace or Senokot while you are on this pain medication to avoid constipation which can be a side effect of narcotic medications.  Do not drive within 4 hours of taking this medication as it will make you drowsy.  Additionally, you probably already know this, but you should not drive within 6 months of having a seizure, this is state law to protect you and other drivers.  Your lab tests today are otherwise reassuring.

## 2022-12-02 LAB — LEVETIRACETAM LEVEL: Levetiracetam Lvl: 28.9 ug/mL (ref 10.0–40.0)

## 2022-12-04 ENCOUNTER — Other Ambulatory Visit (HOSPITAL_COMMUNITY): Payer: Self-pay | Admitting: Internal Medicine

## 2022-12-04 DIAGNOSIS — I10 Essential (primary) hypertension: Secondary | ICD-10-CM | POA: Diagnosis not present

## 2022-12-04 DIAGNOSIS — G40909 Epilepsy, unspecified, not intractable, without status epilepticus: Secondary | ICD-10-CM | POA: Diagnosis not present

## 2022-12-04 DIAGNOSIS — Z1231 Encounter for screening mammogram for malignant neoplasm of breast: Secondary | ICD-10-CM

## 2022-12-04 DIAGNOSIS — N3281 Overactive bladder: Secondary | ICD-10-CM | POA: Diagnosis not present

## 2022-12-04 DIAGNOSIS — I480 Paroxysmal atrial fibrillation: Secondary | ICD-10-CM | POA: Diagnosis not present

## 2022-12-04 DIAGNOSIS — I69354 Hemiplegia and hemiparesis following cerebral infarction affecting left non-dominant side: Secondary | ICD-10-CM | POA: Diagnosis not present

## 2022-12-04 DIAGNOSIS — Z23 Encounter for immunization: Secondary | ICD-10-CM | POA: Diagnosis not present

## 2022-12-04 DIAGNOSIS — Z13820 Encounter for screening for osteoporosis: Secondary | ICD-10-CM

## 2022-12-04 DIAGNOSIS — K219 Gastro-esophageal reflux disease without esophagitis: Secondary | ICD-10-CM | POA: Diagnosis not present

## 2022-12-04 DIAGNOSIS — M25562 Pain in left knee: Secondary | ICD-10-CM | POA: Diagnosis not present

## 2022-12-04 DIAGNOSIS — M24522 Contracture, left elbow: Secondary | ICD-10-CM | POA: Diagnosis not present

## 2022-12-04 DIAGNOSIS — E1165 Type 2 diabetes mellitus with hyperglycemia: Secondary | ICD-10-CM | POA: Diagnosis not present

## 2022-12-04 DIAGNOSIS — E782 Mixed hyperlipidemia: Secondary | ICD-10-CM | POA: Diagnosis not present

## 2022-12-09 ENCOUNTER — Ambulatory Visit: Payer: Medicare Other

## 2022-12-09 DIAGNOSIS — I495 Sick sinus syndrome: Secondary | ICD-10-CM

## 2022-12-09 LAB — CUP PACEART REMOTE DEVICE CHECK
Battery Remaining Longevity: 91 mo
Battery Remaining Percentage: 81 %
Battery Voltage: 3.01 V
Brady Statistic AP VP Percent: 1 %
Brady Statistic AP VS Percent: 96 %
Brady Statistic AS VP Percent: 1 %
Brady Statistic AS VS Percent: 4.3 %
Brady Statistic RA Percent Paced: 68 %
Brady Statistic RV Percent Paced: 4 %
Date Time Interrogation Session: 20240924020013
Implantable Lead Connection Status: 753985
Implantable Lead Connection Status: 753985
Implantable Lead Implant Date: 20220608
Implantable Lead Implant Date: 20220608
Implantable Lead Location: 753859
Implantable Lead Location: 753860
Implantable Pulse Generator Implant Date: 20220608
Lead Channel Impedance Value: 460 Ohm
Lead Channel Impedance Value: 560 Ohm
Lead Channel Pacing Threshold Amplitude: 0.5 V
Lead Channel Pacing Threshold Amplitude: 0.75 V
Lead Channel Pacing Threshold Pulse Width: 0.5 ms
Lead Channel Pacing Threshold Pulse Width: 0.5 ms
Lead Channel Sensing Intrinsic Amplitude: 12 mV
Lead Channel Sensing Intrinsic Amplitude: 5 mV
Lead Channel Setting Pacing Amplitude: 2 V
Lead Channel Setting Pacing Amplitude: 2.5 V
Lead Channel Setting Pacing Pulse Width: 0.5 ms
Lead Channel Setting Sensing Sensitivity: 2 mV
Pulse Gen Model: 2272
Pulse Gen Serial Number: 6462574

## 2022-12-17 ENCOUNTER — Encounter: Payer: Self-pay | Admitting: Orthopedic Surgery

## 2022-12-17 ENCOUNTER — Ambulatory Visit: Payer: Medicare Other | Admitting: Orthopedic Surgery

## 2022-12-17 DIAGNOSIS — G8929 Other chronic pain: Secondary | ICD-10-CM | POA: Diagnosis not present

## 2022-12-17 DIAGNOSIS — M545 Low back pain, unspecified: Secondary | ICD-10-CM

## 2022-12-17 NOTE — Progress Notes (Signed)
Orthopaedic Clinic Return  Assessment: Rachel Fox is a 77 y.o. female with the following: Low back pain, L5 compression fracture.   Plan: Rachel Fox has low back pain, primarily within the left buttock.  She feels much better than she did at the last visit.  Anticipate that she will continue to see improvement.  She will take Tylenol as needed.  Continue to avoid tramadol or stronger narcotics.  If she has further issues, she will return to clinic.  No follow-up is needed at this time.   No orders of the defined types were placed in this encounter.    Follow-up: Return if symptoms worsen or fail to improve.   Subjective:  Chief Complaint  Patient presents with   Back Pain    Compression fracture states pain meds made her have a seizure     History of Present Illness: Rachel Fox is a 77 y.o. female who returns to clinic for evaluation of lower back pain.  I saw the clinic of few weeks ago.  At that time, she was having lower back pain.  She feels much better.  She does continue to have some pain within the left buttock.  However, aside from this pain, she is doing better.  She did take some tramadol, which the ED states contributed to her recent seizure.  She is no longer taking tramadol.  She will take Tylenol as needed.   Review of Systems: No fevers or chills No numbness or tingling No chest pain No shortness of breath No bowel or bladder dysfunction No GI distress No headaches   Objective: There were no vitals taken for this visit.  Physical Exam:  Alert and oriented.  No acute distress.  Seated in a wheelchair.  Lateral hip surgical incision is healed.  No surrounding erythema or drainage.  No tenderness in this area.  Mild discomfort and tenderness within the left buttock..  She is able to maintain a straight leg raise on the right.  Reduced function of the left leg, which is her baseline.  She has an AFO on the left ankle.  IMAGING: I personally  ordered and reviewed the following images:  No new imaging obtained today.   Rachel Barre, MD 12/17/2022 1:32 PM

## 2022-12-22 ENCOUNTER — Ambulatory Visit (INDEPENDENT_AMBULATORY_CARE_PROVIDER_SITE_OTHER): Payer: Medicare Other | Admitting: Neurology

## 2022-12-22 ENCOUNTER — Encounter: Payer: Self-pay | Admitting: Neurology

## 2022-12-22 ENCOUNTER — Telehealth: Payer: Self-pay

## 2022-12-22 VITALS — BP 165/97 | HR 95 | Ht 63.0 in

## 2022-12-22 DIAGNOSIS — G40209 Localization-related (focal) (partial) symptomatic epilepsy and epileptic syndromes with complex partial seizures, not intractable, without status epilepticus: Secondary | ICD-10-CM | POA: Diagnosis not present

## 2022-12-22 DIAGNOSIS — I63131 Cerebral infarction due to embolism of right carotid artery: Secondary | ICD-10-CM | POA: Insufficient documentation

## 2022-12-22 DIAGNOSIS — I69354 Hemiplegia and hemiparesis following cerebral infarction affecting left non-dominant side: Secondary | ICD-10-CM | POA: Diagnosis not present

## 2022-12-22 MED ORDER — LEVETIRACETAM 750 MG PO TABS: 750.0000 mg | ORAL_TABLET | Freq: Two times a day (BID) | ORAL | 3 refills | Status: DC

## 2022-12-22 NOTE — Telephone Encounter (Signed)
Xeomin [id number]  600 Units  95873 ELECTRICAL STIMULATION  CHEMICAL DENERVATION OF LIMBS/TRUNK MUSCLES 64642 1-4 LIMB MUSCLES 64643 ADDITIONAL 1-4 LIMB MUSCLES 64644 5 OR  LIMB MUSCLES 64645 EACH ADDITIONAL 5 OR MORE (LIMB MUSCLES) 64646 TRUNK MUSCLES (ERECTOR SPINAE OR PARA RECTUS) 1-5 MUSCLES 64647 6 OR MORE MUSCLES   HEMIPARESIS AFFECTING LEFT SIDE AS LATE EFFECT OF CVA U98.119

## 2022-12-22 NOTE — Progress Notes (Addendum)
 Chief Complaint  Patient presents with   New Patient (Initial Visit)    Rm 14. Accompanied by caregiver. NP/Paper Proficient/[name] MD/contracture of L elbow joint, seizures, hx CVA, L hemiplegia.      ASSESSMENT AND PLAN  Rachel Fox is a 77 y.o. female   Assessment and Plan    Post-Stroke complex partial seizures Seizures began after stroke in 2020, with 4 episodes in 2021 and a recent episode in September 2024. Patient is compliant with Keppra 500 mg twice a day, level was 28.9. -Increase Keppra from 500mg  to 750mg  twice daily. -Order EEG   Post-Stroke spastic left hemiparesis hemiparesis Left-sided weakness and spasticity since stroke in 2020, with significant impact on balance and mobility also have developed significant muscle spasm and pain, previous Botox injections provided temporary relief. -Prior authorization xeomin 600 units,  -Plan for Botox injections if approved by insurance.  Worsening gait abnormal, urinary incontinence, Certainly is related to her right MCA in 2020, cervical spondylitic myelopathy also play a role, but she denies further evaluation such as CT myelogram, not MRI candidate due to pacemaker, less likely a good surgical candidate,     DIAGNOSTIC DATA (LABS, IMAGING, TESTING) - I reviewed patient records, labs, notes, testing and imaging myself where available. Labs from Dr. Nita Sells office April 10, 2023, Keppra level was 18.7  MEDICAL HISTORY:  Rachel Fox, is a 77 year old female, accompanied by her caregiver, seen in request by   her primary care from [place], [place] for evaluation of complex partial seizure   History is obtained from the patient and review of electronic medical records. I personally reviewed pertinent available imaging films in PACS.   PMHx of  HTN HLD Stroke Seizure, S/p pacemaker in 2022.    Discussed the use of AI scribe software for clinical note transcription with the patient, who gave  verbal consent to proceed.  The patient, with a history of stroke in 2020, embolic large right MCA stroke, due to newly diagnosed atrial fibrillation, on Eliquis treatment and subsequent seizures, presents with ongoing difficulty with balance and mobility. She reports needing a walker for mobility since her hospital discharge following the stroke. She also reports issues with her left arm, which has been treated with Botox injections in the past but Dr. Judithann Sheen, who retired in December 2022, but the treatment has not resulted in significant improvement.  Did help her left arm spasticity some  The patient's seizures, which began in 2021, had 4 seizure in 2021, have been controlled with Keppra, 500 mg twice a day since then, until  recent episode in September, 2024.  Presenting with confusion, sweaty, seizure activity lasting 5 minutes, Keppra level was 28.9  She complains of increased left arm spasticity and pain, also worsening gait abnormality, urinary incontinence    LABS Keppra level: 28.9 (11/2022) Cr: 1.28 GFR: 44 Hb: 15.1  RADIOLOGY Brain CT: Large right MCA stroke (08/2019) Neck CT: Cervical arthritis, osteophyte formation, bone irregularity, and spinal canal stenosis, not a candidate for MRI due to pacemaker    PHYSICAL EXAM:   Vitals:   12/22/22 1044  BP: (!) 165/97  Pulse: 95  Height: 5\' 3"  (1.6 m)   Body mass index is 23.91 kg/m.  PHYSICAL EXAMNIATION:  Gen: NAD, conversant, well nourised, well groomed                     Cardiovascular: Regular rate rhythm, no peripheral edema, warm, nontender. Eyes: Conjunctivae clear  without exudates or hemorrhage Neck: Supple, no carotid bruits. Pulmonary: Clear to auscultation bilaterally   NEUROLOGICAL EXAM:  MENTAL STATUS: Speech/cognition: Awake, alert, oriented to history taking and casual conversation CRANIAL NERVES: CN II: Visual fields are full to confrontation. Pupils are round equal and briskly reactive to  light. CN III, IV, VI: extraocular movement are normal. No ptosis. CN V: Facial sensation is intact to light touch CN VII: Left lower face weakness CN VIII: Hearing is normal to causal conversation. CN IX, X: Phonation is normal. CN XI: Head turning and shoulder shrug are intact  MOTOR: Spastic left hemiparesis, limited range of motion of left shoulder, painful with passive stretch, tendency for elbow flexion pronation, maximum elbow extension 150 degree, finger flexion, full range of motion of finger,  Profound left lower extremity weakness, left hip flexion 3, knee flexion 3, extension 4, left ankle in brace  REFLEXES: Hyperreflexia of bilateral upper and lower extremity, left worse than right,  SENSORY: Intact to light touch, pinprick and vibratory sensation are intact in fingers and toes.  COORDINATION: There is no trunk or limb dysmetria noted.  GAIT/STANCE: Deferred  REVIEW OF SYSTEMS:  Full 14 system review of systems performed and notable only for as above All other review of systems were negative.   ALLERGIES: Allergies  Allergen Reactions   Lactose Intolerance (Gi) Other (See Comments)    Causes Gas in patient.    HOME MEDICATIONS: Current Outpatient Medications  Medication Sig Dispense Refill   acetaminophen (TYLENOL) 650 MG CR tablet Take 650 mg by mouth 2 (two) times daily.     amiodarone (PACERONE) 200 MG tablet Take 0.5 tablets (100 mg total) by mouth daily. 15 tablet 0   amLODipine (NORVASC) 5 MG tablet Take 1 tablet (5 mg total) by mouth daily. 30 tablet 0   apixaban (ELIQUIS) 2.5 MG TABS tablet Take 1 tablet (2.5 mg total) by mouth 2 (two) times daily. 60 tablet 0   atorvastatin (LIPITOR) 80 MG tablet Take 1 tablet (80 mg total) by mouth daily at 6 PM. 30 tablet 0   lactase (LACTAID) 3000 units tablet Take 3,000 Units by mouth as needed.     levETIRAcetam (KEPPRA) 500 MG tablet Take 1 tablet (500 mg total) by mouth 2 (two) times daily. 60 tablet 0    lidocaine (LIDOCAINE PAIN RELIEF) 4 % Place 1 patch onto the skin daily. (Patient taking differently: Place 1 patch onto the skin daily as needed.) 30 patch 0   losartan (COZAAR) 50 MG tablet Take 50 mg by mouth daily.     metoprolol tartrate (LOPRESSOR) 25 MG tablet Take 0.5 tablets (12.5 mg total) by mouth 2 (two) times daily. 30 tablet 0   No current facility-administered medications for this visit.    PAST MEDICAL HISTORY: Past Medical History:  Diagnosis Date   Atrial fibrillation [facility])    Cerebrovascular accident (CVA) with involvement of left side of body (HCC) 09/20/2018   High cholesterol    Hypertension    Seizures (HCC)    Type 2 diabetes mellitus (HCC)     PAST SURGICAL HISTORY: Past Surgical History:  Procedure Laterality Date   ABDOMINAL HYSTERECTOMY     COLONOSCOPY WITH PROPOFOL N/A 10/20/2020   Procedure: COLONOSCOPY WITH PROPOFOL;  Surgeon: Malissa Hippo, MD;  Location: [facility] ENDO SUITE;  Service: Endoscopy;  Laterality: N/A;   ESOPHAGOGASTRODUODENOSCOPY (EGD) WITH PROPOFOL N/A 10/20/2020   Procedure: ESOPHAGOGASTRODUODENOSCOPY (EGD) WITH PROPOFOL;  Surgeon: Malissa Hippo, MD;  Location: [facility] ENDO  SUITE;  Service: Endoscopy;  Laterality: N/A;   HIP ARTHROPLASTY Left 10/10/2020   Procedure: ARTHROPLASTY BIPOLAR HIP (HEMIARTHROPLASTY);  Surgeon: Oliver Barre, MD;  Location: [facility] ORS;  Service: Orthopedics;  Laterality: Left;   PACEMAKER IMPLANT N/A 08/22/2020   Procedure: PACEMAKER IMPLANT;  Surgeon: Marinus Maw, MD;  Location: [facility] INVASIVE CV LAB;  Service: Cardiovascular;  Laterality: N/A;   TUBAL LIGATION      FAMILY HISTORY: Family History  Problem Relation Age of Onset   Stroke Mother    Heart attack Father    Stroke Sister    Heart attack Sister     SOCIAL HISTORY: Social History   Socioeconomic History   Marital status: Divorced    Spouse name: Not on file   Number of children: Not on file   Years of education: Not on file   Highest education  level: Not on file  Occupational History   Not on file  Tobacco Use   Smoking status: Former    Current packs/day: 0.00    Types: Cigarettes    Quit date: 04/14/1988    Years since quitting: 34.7   Smokeless tobacco: Never  Vaping Use   Vaping status: Never Used  Substance and Sexual Activity   Alcohol use: Never   Drug use: Never   Sexual activity: Not on file  Other Topics Concern   Not on file  Social History Narrative   Not on file   Social Determinants of Health   Financial Resource Strain: Not on file  Food Insecurity: Not on file  Transportation Needs: Not on file  Physical Activity: Not on file  Stress: Not on file  Social Connections: Not on file  Intimate Partner Violence: Not on file      Levert Feinstein, M.D. Ph.D.  [facility] [address] [place], [place] [address] Ph: [phone] Fax: [phone]  CC:  Benita Stabile, MD [address] [place],  [place] [address]  Benita Stabile, MD

## 2022-12-22 NOTE — Telephone Encounter (Signed)
Pt's medicare/medicaid does not require prior auth. I called her to schedule an appt and phone continued to ring with no VM option. Will try again later.

## 2022-12-25 NOTE — Progress Notes (Signed)
Remote pacemaker transmission.   

## 2022-12-25 NOTE — Telephone Encounter (Signed)
I was able to reach pt by phone, scheduled appt for 12/18 @ 11:00 am with Dr. Terrace Arabia. I offered sooner appts but this was the first one that worked for her.

## 2023-01-02 ENCOUNTER — Other Ambulatory Visit (HOSPITAL_COMMUNITY): Payer: Self-pay | Admitting: Internal Medicine

## 2023-01-02 DIAGNOSIS — Z780 Asymptomatic menopausal state: Secondary | ICD-10-CM

## 2023-01-14 ENCOUNTER — Inpatient Hospital Stay (HOSPITAL_COMMUNITY): Admission: RE | Admit: 2023-01-14 | Payer: Medicare Other | Source: Ambulatory Visit

## 2023-01-14 ENCOUNTER — Other Ambulatory Visit (HOSPITAL_COMMUNITY): Payer: Medicare Other

## 2023-01-16 ENCOUNTER — Encounter: Payer: Self-pay | Admitting: Internal Medicine

## 2023-01-16 ENCOUNTER — Ambulatory Visit: Payer: Medicare Other | Attending: Internal Medicine | Admitting: Internal Medicine

## 2023-01-16 VITALS — BP 146/90 | HR 97 | Ht 63.0 in | Wt 148.3 lb

## 2023-01-16 DIAGNOSIS — I495 Sick sinus syndrome: Secondary | ICD-10-CM | POA: Diagnosis not present

## 2023-01-16 DIAGNOSIS — I4821 Permanent atrial fibrillation: Secondary | ICD-10-CM | POA: Diagnosis not present

## 2023-01-16 LAB — CUP PACEART INCLINIC DEVICE CHECK
Battery Remaining Longevity: 97 mo
Battery Voltage: 3.01 V
Brady Statistic RA Percent Paced: 61 %
Brady Statistic RV Percent Paced: 4.9 %
Date Time Interrogation Session: 20241101124610
Implantable Lead Connection Status: 753985
Implantable Lead Connection Status: 753985
Implantable Lead Implant Date: 20220608
Implantable Lead Implant Date: 20220608
Implantable Lead Location: 753859
Implantable Lead Location: 753860
Implantable Pulse Generator Implant Date: 20220608
Lead Channel Impedance Value: 487.5 Ohm
Lead Channel Impedance Value: 600 Ohm
Lead Channel Pacing Threshold Amplitude: 0 V
Lead Channel Pacing Threshold Amplitude: 0.75 V
Lead Channel Pacing Threshold Amplitude: 0.75 V
Lead Channel Pacing Threshold Pulse Width: 0 ms
Lead Channel Pacing Threshold Pulse Width: 0.5 ms
Lead Channel Pacing Threshold Pulse Width: 0.5 ms
Lead Channel Sensing Intrinsic Amplitude: 12 mV
Lead Channel Sensing Intrinsic Amplitude: 4.5 mV
Lead Channel Setting Pacing Amplitude: 2 V
Lead Channel Setting Pacing Amplitude: 2.5 V
Lead Channel Setting Pacing Pulse Width: 0.5 ms
Lead Channel Setting Sensing Sensitivity: 2 mV
Pulse Gen Model: 2272
Pulse Gen Serial Number: 6462574

## 2023-01-16 NOTE — Progress Notes (Signed)
HPI Rachel Fox returns today for evaluation of symptomatic tach-brady syndrome. The patient is a pleasant 77 yo woman who underwent PPM insertion over 2 years ago with a [employer] DDD PM inserted. In the interim she notes she has been stable with no chest pain or sob. she has rare palpitations on low dose amiodarone. She has reverted to atrial fib about 2 months ago and did not know it.  Allergies  Allergen Reactions   Lactose Intolerance (Gi) Other (See Comments)    Causes Gas in patient.     Current Outpatient Medications  Medication Sig Dispense Refill   acetaminophen (TYLENOL) 650 MG CR tablet Take 650 mg by mouth 2 (two) times daily.     amiodarone (PACERONE) 200 MG tablet Take 0.5 tablets (100 mg total) by mouth daily. 15 tablet 0   amLODipine (NORVASC) 5 MG tablet Take 1 tablet (5 mg total) by mouth daily. 30 tablet 0   apixaban (ELIQUIS) 2.5 MG TABS tablet Take 1 tablet (2.5 mg total) by mouth 2 (two) times daily. 60 tablet 0   atorvastatin (LIPITOR) 80 MG tablet Take 1 tablet (80 mg total) by mouth daily at 6 PM. 30 tablet 0   lactase (LACTAID) 3000 units tablet Take 3,000 Units by mouth as needed.     lidocaine (LIDOCAINE PAIN RELIEF) 4 % Place 1 patch onto the skin daily. (Patient taking differently: Place 1 patch onto the skin daily as needed.) 30 patch 0   metoprolol tartrate (LOPRESSOR) 25 MG tablet Take 0.5 tablets (12.5 mg total) by mouth 2 (two) times daily. 30 tablet 0   levETIRAcetam (KEPPRA) 750 MG tablet Take 1 tablet (750 mg total) by mouth 2 (two) times daily. (Patient not taking: Reported on 01/16/2023) 180 tablet 3   No current facility-administered medications for this visit.     Past Medical History:  Diagnosis Date   Atrial fibrillation [facility])    Cerebrovascular accident (CVA) with involvement of left side of body (HCC) 09/20/2018   High cholesterol    Hypertension    Seizures (HCC)    Type 2 diabetes mellitus (HCC)     ROS:   All systems  reviewed and negative except as noted in the HPI.   Past Surgical History:  Procedure Laterality Date   ABDOMINAL HYSTERECTOMY     COLONOSCOPY WITH PROPOFOL N/A 10/20/2020   Procedure: COLONOSCOPY WITH PROPOFOL;  Surgeon: Malissa Hippo, MD;  [facility];  Service: Endoscopy;  Laterality: N/A;   ESOPHAGOGASTRODUODENOSCOPY (EGD) WITH PROPOFOL N/A 10/20/2020   Procedure: ESOPHAGOGASTRODUODENOSCOPY (EGD) WITH PROPOFOL;  Surgeon: Malissa Hippo, MD;  [facility];  Service: Endoscopy;  Laterality: N/A;   HIP ARTHROPLASTY Left 10/10/2020   Procedure: ARTHROPLASTY BIPOLAR HIP (HEMIARTHROPLASTY);  Surgeon: Oliver Barre, MD;  Location: [facility] OR[facility];  Service: Orthopedics;  Laterality: Left;   PACEMAKER IMPLANT N/A 08/22/2020   Procedure: PACEMAKER IMPLANT;  Surgeon: Marinus Maw, MD;  Location: [facility] INV[facility] CV LAB;  Service: Cardiovascular;  Laterality: N/A;   TUBAL LIGATION       Family History  Problem Relation Age of Onset   Stroke Mother    Heart attack Father    Stroke Sister    Heart attack Sister      Social History   Socioeconomic History   Marital status: Divorced    Spouse name: Not on file   Number of children: Not on file   Years of education: Not on file  Highest education level: Not on file  Occupational History   Not on file  Tobacco Use   Smoking status: Former    Current packs/day: 0.00    Types: Cigarettes    Quit date: 04/14/1988    Years since quitting: 34.7   Smokeless tobacco: Never  Vaping Use   Vaping status: Never Used  Substance and Sexual Activity   Alcohol use: Never   Drug use: Never   Sexual activity: Not on file  Other Topics Concern   Not on file  Social History Narrative   Not on file   Social Determinants of Health   Financial Resource Strain: Not on file  Food Insecurity: Not on file  Transportation Needs: Not on file  Physical Activity: Not on file  Stress: Not on file  Social Connections: Not on file   Intimate Partner Violence: Not on file     BP (!) 154/90 (BP Location: Right Arm, Patient Position: Sitting, Cuff Size: Normal)   Pulse 97   Ht 5\' 3"  (1.6 m)   Wt 148 lb 4.8 oz (67.3 kg)   SpO2 99%   BMI 26.27 kg/m   Physical Exam:  Well appearing NAD HEENT: Unremarkable Neck:  No JVD, no thyromegally Lymphatics:  No adenopathy Back:  No CVA tenderness Lungs:  Clear with  no wheezes HEART:  Regular rate rhythm, no murmurs, no rubs, no clicks Abd:  soft, positive bowel sounds, no organomegally, no rebound, no guarding Ext:  2 plus pulses, no edema, no cyanosis, no clubbing Skin:  No rashes no nodules Neuro:  CN II through XII intact, motor grossly intact  DEVICE  Normal device function.  See PaceArt for details.   Assess/Plan: 1. Symptomatic tachy brady syndrome - she has been asymptomatic s/p PPM insertion 2. Atrial fib - she will stop her low dose amio as she has been out of rhythm and asymptomatic. If she develops more symptoms, might consider uptitration of her rate control. 3. Atrial flutter - none since her last visit. 4. Coags - she will continue eliquis.    Rachel Fox Rachel Moger,MD

## 2023-01-16 NOTE — Patient Instructions (Signed)
Medication Instructions:   Stop Taking Amiodarone   *If you need a refill on your cardiac medications before your next appointment, please call your pharmacy*   Lab Work: NONE   If you have labs (blood work) drawn today and your tests are completely normal, you will receive your results only by: MyChart Message (if you have MyChart) OR A paper copy in the mail If you have any lab test that is abnormal or we need to change your treatment, we will call you to review the results.   Testing/Procedures: NONE    Follow-Up: At [facility], you and your health needs are our priority.  As part of our continuing mission to provide you with exceptional heart care, we have created designated Provider Care Teams.  These Care Teams include your primary Cardiologist (physician) and Advanced Practice Providers (APPs -  Physician Assistants and Nurse Practitioners) who all work together to provide you with the care you need, when you need it.  We recommend signing up for the patient portal called "MyChart".  Sign up information is provided on this After Visit Summary.  MyChart is used to connect with patients for Virtual Visits (Telemedicine).  Patients are able to view lab/test results, encounter notes, upcoming appointments, etc.  Non-urgent messages can be sent to your provider as well.   To learn more about what you can do with MyChart, go to [email or web address].    Your next appointment:   1 year(s)  Provider:   Lewayne Bunting, MD    Other Instructions Thank you for choosing [facility]!

## 2023-01-26 ENCOUNTER — Ambulatory Visit (INDEPENDENT_AMBULATORY_CARE_PROVIDER_SITE_OTHER): Payer: Medicare Other | Admitting: Neurology

## 2023-01-26 DIAGNOSIS — G40209 Localization-related (focal) (partial) symptomatic epilepsy and epileptic syndromes with complex partial seizures, not intractable, without status epilepticus: Secondary | ICD-10-CM

## 2023-01-26 DIAGNOSIS — I69354 Hemiplegia and hemiparesis following cerebral infarction affecting left non-dominant side: Secondary | ICD-10-CM

## 2023-01-26 DIAGNOSIS — I63131 Cerebral infarction due to embolism of right carotid artery: Secondary | ICD-10-CM

## 2023-01-28 ENCOUNTER — Ambulatory Visit (HOSPITAL_COMMUNITY): Payer: Medicare Other

## 2023-01-28 ENCOUNTER — Other Ambulatory Visit (HOSPITAL_COMMUNITY): Payer: Medicare Other

## 2023-02-05 ENCOUNTER — Ambulatory Visit (HOSPITAL_COMMUNITY)
Admission: RE | Admit: 2023-02-05 | Discharge: 2023-02-05 | Disposition: A | Discharge status: Home / Self Care | Payer: Medicare Other | Source: Ambulatory Visit | Attending: Internal Medicine | Admitting: Internal Medicine

## 2023-02-05 DIAGNOSIS — Z1231 Encounter for screening mammogram for malignant neoplasm of breast: Secondary | ICD-10-CM | POA: Insufficient documentation

## 2023-02-05 DIAGNOSIS — Z780 Asymptomatic menopausal state: Secondary | ICD-10-CM

## 2023-02-05 DIAGNOSIS — M85851 Other specified disorders of bone density and structure, right thigh: Secondary | ICD-10-CM | POA: Diagnosis not present

## 2023-02-16 NOTE — Procedures (Signed)
   HISTORY: 77 year old female with poststroke seizure,  TECHNIQUE:  This is a routine 16 channel EEG recording with one channel devoted to a limited EKG recording.  It was performed during wakefulness, drowsiness and asleep.  Hyperventilation and photic stimulation were performed as activating procedures.  There are frequent eye movement and bifrontal muscle artifact.  Upon maximum arousal, posterior dominant waking rhythm consistent of dysrhythmic low amplitude alpha range activity, which are are symmetric over the bilateral posterior derivations and attenuated with eye opening.  Photic stimulation did not alter the tracing.  Hyperventilation produced mild/moderate buildup with higher amplitude and the slower activities noted.  During EEG recording, patient developed drowsiness and no deeper stage of sleep was achieved  During EEG recording, there was no epileptiform discharge noted.  EKG demonstrate normal sinus rhythm.  CONCLUSION: This is a  normal awake EEG.  There is no electrodiagnostic evidence of epileptiform discharge.  Levert Feinstein, M.D. Ph.D.  [facility] [address] [place], [place] [address] Phone: [phone] Fax:      [phone]

## 2023-03-04 ENCOUNTER — Telehealth: Payer: Self-pay | Admitting: Neurology

## 2023-03-04 ENCOUNTER — Ambulatory Visit (INDEPENDENT_AMBULATORY_CARE_PROVIDER_SITE_OTHER): Payer: Medicare Other | Admitting: Neurology

## 2023-03-04 VITALS — BP 201/102 | HR 82

## 2023-03-04 DIAGNOSIS — I69354 Hemiplegia and hemiparesis following cerebral infarction affecting left non-dominant side: Secondary | ICD-10-CM | POA: Diagnosis not present

## 2023-03-04 DIAGNOSIS — I63131 Cerebral infarction due to embolism of right carotid artery: Secondary | ICD-10-CM | POA: Diagnosis not present

## 2023-03-04 MED ORDER — INCOBOTULINUMTOXINA 100 UNITS IM SOLR
600.0000 [IU] | INTRAMUSCULAR | Status: AC
  Administered 2023-03-05: 600 [IU] via INTRAMUSCULAR

## 2023-03-04 NOTE — Telephone Encounter (Signed)
 Pt was accepted by [facility], they will reach out to schedule.

## 2023-03-04 NOTE — Progress Notes (Unsigned)
xeomin 100units x 6 vial  Ndc-[phone] [id number], [id number] Exp-2026/12, 01/27 B/B  Bacteriostatic 0.9% Sodium Chloride- 12mL  BJY:[id number] Expiration: 01/16/24 NDC: [id number] Dx:  Cerebrovascular accident (CVA) due to embolism of right carotid artery (HCC) I63.131   Spastic hemiparesis of left nondominant side as late effect of cerebral infarction [facility]) I69.354   Partial symptomatic epilepsy with complex partial seizures, not intractable, without status epilepticus (HCC) G40.209    WITNESSED ON:GEXBM JONES RN

## 2023-03-04 NOTE — Progress Notes (Unsigned)
Chief Complaint  Patient presents with   Injections    Rm14, Pt is well and ready for injection, cna caregiver present       ASSESSMENT AND PLAN  Rachel Fox is a 77 y.o. female   Assessment and Plan    Post-Stroke complex partial seizures Seizures began after stroke in 2020, with 4 episodes in 2021 and a recent episode in September 2024. Patient is compliant with Keppra 500 mg twice a day, level was 28.9. -Increase Keppra from 500mg  to 750mg  twice daily. -Order EEG   Post-Stroke spastic left hemiparesis hemiparesis Left-sided weakness and spasticity since stroke in 2020, with significant impact on balance and mobility also have developed significant muscle spasm and pain, previous Botox injections provided temporary relief. -Prior authorization xeomin 600 units,  -Plan for Botox injections if approved by insurance.  Worsening gait abnormal, urinary incontinence, Certainly is related to her right MCA in 2020, cervical spondylitic myelopathy also play a role, but she denies further evaluation such as CT myelogram, not MRI candidate due to pacemaker, less likely a good surgical candidate,     DIAGNOSTIC DATA (LABS, IMAGING, TESTING) - I reviewed patient records, labs, notes, testing and imaging myself where available.   MEDICAL HISTORY:  Rachel Fox, is a 77 year old female, accompanied by her caregiver, seen in request by   her primary care from [place], [place] for evaluation of complex partial seizure   History is obtained from the patient and review of electronic medical records. I personally reviewed pertinent available imaging films in PACS.   PMHx of  HTN HLD Stroke Seizure, S/p pacemaker in 2022.    Discussed the use of AI scribe software for clinical note transcription with the patient, who gave verbal consent to proceed.  The patient, with a history of stroke in 2020, embolic large right MCA stroke, due to newly diagnosed atrial fibrillation,  on Eliquis treatment and subsequent seizures, presents with ongoing difficulty with balance and mobility. She reports needing a walker for mobility since her hospital discharge following the stroke. She also reports issues with her left arm, which has been treated with Botox injections in the past but Dr. Judithann Sheen, who retired in December 2022, but the treatment has not resulted in significant improvement.  Did help her left arm spasticity some  The patient's seizures, which began in 2021, had 4 seizure in 2021, have been controlled with Keppra, 500 mg twice a day since then, until  recent episode in September, 2024.  Presenting with confusion, sweaty, seizure activity lasting 5 minutes, Keppra level was 28.9  She complains of increased left arm spasticity and pain, also worsening gait abnormality, urinary incontinence    LABS Keppra level: 28.9 (11/2022) Cr: 1.28 GFR: 44 Hb: 15.1  RADIOLOGY Brain CT: Large right MCA stroke (08/2019) Neck CT: Cervical arthritis, osteophyte formation, bone irregularity, and spinal canal stenosis, not a candidate for MRI due to pacemaker    PHYSICAL EXAM:   Vitals:   03/04/23 1043  BP: (!) 201/102  Pulse: 82   There is no height or weight on file to calculate BMI.  PHYSICAL EXAMNIATION:  Gen: NAD, conversant, well nourised, well groomed                     Cardiovascular: Regular rate rhythm, no peripheral edema, warm, nontender. Eyes: Conjunctivae clear without exudates or hemorrhage Neck: Supple, no carotid bruits. Pulmonary: Clear to auscultation bilaterally   NEUROLOGICAL EXAM:  MENTAL STATUS: Speech/cognition: Awake,  alert, oriented to history taking and casual conversation CRANIAL NERVES: CN II: Visual fields are full to confrontation. Pupils are round equal and briskly reactive to light. CN III, IV, VI: extraocular movement are normal. No ptosis. CN V: Facial sensation is intact to light touch CN VII: Left lower face weakness CN  VIII: Hearing is normal to causal conversation. CN IX, X: Phonation is normal. CN XI: Head turning and shoulder shrug are intact  MOTOR: Spastic left hemiparesis, limited range of motion of left shoulder, painful with passive stretch, tendency for elbow flexion pronation, maximum elbow extension 150 degree, finger flexion, full range of motion of finger,  Profound left lower extremity weakness, left hip flexion 3, knee flexion 3, extension 4, left ankle in brace  REFLEXES: Hyperreflexia of bilateral upper and lower extremity, left worse than right,  SENSORY: Intact to light touch, pinprick and vibratory sensation are intact in fingers and toes.  COORDINATION: There is no trunk or limb dysmetria noted.  GAIT/STANCE: Deferred  REVIEW OF SYSTEMS:  Full 14 system review of systems performed and notable only for as above All other review of systems were negative.   ALLERGIES: Allergies  Allergen Reactions   Lactose Intolerance (Gi) Other (See Comments)    Causes Gas in patient.    HOME MEDICATIONS: Current Outpatient Medications  Medication Sig Dispense Refill   acetaminophen (TYLENOL) 650 MG CR tablet Take 650 mg by mouth 2 (two) times daily.     amLODipine (NORVASC) 5 MG tablet Take 1 tablet (5 mg total) by mouth daily. 30 tablet 0   apixaban (ELIQUIS) 2.5 MG TABS tablet Take 1 tablet (2.5 mg total) by mouth 2 (two) times daily. 60 tablet 0   atorvastatin (LIPITOR) 80 MG tablet Take 1 tablet (80 mg total) by mouth daily at 6 PM. 30 tablet 0   lactase (LACTAID) 3000 units tablet Take 3,000 Units by mouth as needed.     levETIRAcetam (KEPPRA) 750 MG tablet Take 1 tablet (750 mg total) by mouth 2 (two) times daily. (Patient not taking: Reported on 01/16/2023) 180 tablet 3   lidocaine (LIDOCAINE PAIN RELIEF) 4 % Place 1 patch onto the skin daily. (Patient taking differently: Place 1 patch onto the skin daily as needed.) 30 patch 0   metoprolol tartrate (LOPRESSOR) 25 MG tablet Take  0.5 tablets (12.5 mg total) by mouth 2 (two) times daily. 30 tablet 0   Current Facility-Administered Medications  Medication Dose Route Frequency Provider Last Rate Last Admin   incobotulinumtoxinA (XEOMIN) 100 units injection 600 Units  600 Units Intramuscular Q90 days Levert Feinstein, MD        PAST MEDICAL HISTORY: Past Medical History:  Diagnosis Date   Atrial fibrillation [facility])    Cerebrovascular accident (CVA) with involvement of left side of body (HCC) 09/20/2018   High cholesterol    Hypertension    Seizures (HCC)    Type 2 diabetes mellitus (HCC)     PAST SURGICAL HISTORY: Past Surgical History:  Procedure Laterality Date   ABDOMINAL HYSTERECTOMY     COLONOSCOPY WITH PROPOFOL N/A 10/20/2020   Procedure: COLONOSCOPY WITH PROPOFOL;  Surgeon: Malissa Hippo, MD;  [facility];  Service: Endoscopy;  Laterality: N/A;   ESOPHAGOGASTRODUODENOSCOPY (EGD) WITH PROPOFOL N/A 10/20/2020   Procedure: ESOPHAGOGASTRODUODENOSCOPY (EGD) WITH PROPOFOL;  Surgeon: Malissa Hippo, MD;  [facility];  Service: Endoscopy;  Laterality: N/A;   HIP ARTHROPLASTY Left 10/10/2020   Procedure: ARTHROPLASTY BIPOLAR HIP (HEMIARTHROPLASTY);  Surgeon: Dallas Schimke,  Redge Gainer, MD;  Location: [facility];  Service: Orthopedics;  Laterality: Left;   PACEMAKER IMPLANT N/A 08/22/2020   Procedure: PACEMAKER IMPLANT;  Surgeon: Marinus Maw, MD;  [facility];  Service: Cardiovascular;  Laterality: N/A;   TUBAL LIGATION      FAMILY HISTORY: Family History  Problem Relation Age of Onset   Stroke Mother    Heart attack Father    Stroke Sister    Heart attack Sister     SOCIAL HISTORY: Social History   Socioeconomic History   Marital status: Divorced    Spouse name: Not on file   Number of children: Not on file   Years of education: Not on file   Highest education level: Not on file  Occupational History   Not on file  Tobacco Use   Smoking status: Former    Current packs/day:  0.00    Types: Cigarettes    Quit date: 04/14/1988    Years since quitting: 34.9   Smokeless tobacco: Never  Vaping Use   Vaping status: Never Used  Substance and Sexual Activity   Alcohol use: Never   Drug use: Never   Sexual activity: Not on file  Other Topics Concern   Not on file  Social History Narrative   Not on file   Social Drivers of Health   Financial Resource Strain: Not on file  Food Insecurity: Not on file  Transportation Needs: Not on file  Physical Activity: Not on file  Stress: Not on file  Social Connections: Not on file  Intimate Partner Violence: Not on file      Levert Feinstein, M.D. Ph.D.  [facility] [address], [address] [place], [place] [address] Ph: [phone] Fax: [phone]  CC:  Benita Stabile, MD [address] [place],  [place] [address]  Benita Stabile, MD

## 2023-03-05 DIAGNOSIS — I63131 Cerebral infarction due to embolism of right carotid artery: Secondary | ICD-10-CM | POA: Diagnosis not present

## 2023-03-05 DIAGNOSIS — I69354 Hemiplegia and hemiparesis following cerebral infarction affecting left non-dominant side: Secondary | ICD-10-CM | POA: Diagnosis not present

## 2023-03-10 ENCOUNTER — Ambulatory Visit (INDEPENDENT_AMBULATORY_CARE_PROVIDER_SITE_OTHER): Payer: Medicare Other

## 2023-03-10 DIAGNOSIS — I495 Sick sinus syndrome: Secondary | ICD-10-CM | POA: Diagnosis not present

## 2023-03-12 LAB — CUP PACEART REMOTE DEVICE CHECK
Battery Remaining Longevity: 93 mo
Battery Remaining Percentage: 78 %
Battery Voltage: 3.01 V
Brady Statistic AP VP Percent: 0 %
Brady Statistic AP VS Percent: 0 %
Brady Statistic AS VP Percent: 0 %
Brady Statistic AS VS Percent: 0 %
Brady Statistic RA Percent Paced: 0 %
Brady Statistic RV Percent Paced: 4.4 %
Date Time Interrogation Session: 20241224023146
Implantable Lead Connection Status: 753985
Implantable Lead Connection Status: 753985
Implantable Lead Implant Date: 20220608
Implantable Lead Implant Date: 20220608
Implantable Lead Location: 753859
Implantable Lead Location: 753860
Implantable Pulse Generator Implant Date: 20220608
Lead Channel Impedance Value: 460 Ohm
Lead Channel Impedance Value: 560 Ohm
Lead Channel Pacing Threshold Amplitude: 0.5 V
Lead Channel Pacing Threshold Amplitude: 0.75 V
Lead Channel Pacing Threshold Pulse Width: 0.5 ms
Lead Channel Pacing Threshold Pulse Width: 0.5 ms
Lead Channel Sensing Intrinsic Amplitude: 10.8 mV
Lead Channel Sensing Intrinsic Amplitude: 4.5 mV
Lead Channel Setting Pacing Amplitude: 2 V
Lead Channel Setting Pacing Amplitude: 2.5 V
Lead Channel Setting Pacing Pulse Width: 0.5 ms
Lead Channel Setting Sensing Sensitivity: 2 mV
Pulse Gen Model: 2272
Pulse Gen Serial Number: 6462574

## 2023-04-01 ENCOUNTER — Telehealth: Payer: Self-pay | Admitting: Internal Medicine

## 2023-04-01 DIAGNOSIS — I495 Sick sinus syndrome: Secondary | ICD-10-CM | POA: Diagnosis not present

## 2023-04-01 DIAGNOSIS — I69354 Hemiplegia and hemiparesis following cerebral infarction affecting left non-dominant side: Secondary | ICD-10-CM | POA: Diagnosis not present

## 2023-04-01 DIAGNOSIS — I129 Hypertensive chronic kidney disease with stage 1 through stage 4 chronic kidney disease, or unspecified chronic kidney disease: Secondary | ICD-10-CM | POA: Diagnosis not present

## 2023-04-01 DIAGNOSIS — I4891 Unspecified atrial fibrillation: Secondary | ICD-10-CM | POA: Diagnosis not present

## 2023-04-01 DIAGNOSIS — Z7901 Long term (current) use of anticoagulants: Secondary | ICD-10-CM | POA: Diagnosis not present

## 2023-04-01 DIAGNOSIS — Z96642 Presence of left artificial hip joint: Secondary | ICD-10-CM | POA: Diagnosis not present

## 2023-04-01 DIAGNOSIS — Z950 Presence of cardiac pacemaker: Secondary | ICD-10-CM | POA: Diagnosis not present

## 2023-04-01 DIAGNOSIS — Z87891 Personal history of nicotine dependence: Secondary | ICD-10-CM | POA: Diagnosis not present

## 2023-04-01 DIAGNOSIS — E7849 Other hyperlipidemia: Secondary | ICD-10-CM | POA: Diagnosis not present

## 2023-04-01 DIAGNOSIS — D631 Anemia in chronic kidney disease: Secondary | ICD-10-CM | POA: Diagnosis not present

## 2023-04-01 DIAGNOSIS — Z8781 Personal history of (healed) traumatic fracture: Secondary | ICD-10-CM | POA: Diagnosis not present

## 2023-04-01 DIAGNOSIS — N182 Chronic kidney disease, stage 2 (mild): Secondary | ICD-10-CM | POA: Diagnosis not present

## 2023-04-01 DIAGNOSIS — G40409 Other generalized epilepsy and epileptic syndromes, not intractable, without status epilepticus: Secondary | ICD-10-CM | POA: Diagnosis not present

## 2023-04-01 DIAGNOSIS — K5909 Other constipation: Secondary | ICD-10-CM | POA: Diagnosis not present

## 2023-04-01 DIAGNOSIS — I69391 Dysphagia following cerebral infarction: Secondary | ICD-10-CM | POA: Diagnosis not present

## 2023-04-01 DIAGNOSIS — E1169 Type 2 diabetes mellitus with other specified complication: Secondary | ICD-10-CM | POA: Diagnosis not present

## 2023-04-01 DIAGNOSIS — Z9851 Tubal ligation status: Secondary | ICD-10-CM | POA: Diagnosis not present

## 2023-04-01 DIAGNOSIS — E1122 Type 2 diabetes mellitus with diabetic chronic kidney disease: Secondary | ICD-10-CM | POA: Diagnosis not present

## 2023-04-01 NOTE — Telephone Encounter (Signed)
 Called and spoke to [facility] pharmacy to verbalized that pt no longer needed medication refills for Amiodarone  as she is no longer taking medication.   Pt notified and verbalized understanding.

## 2023-04-01 NOTE — Telephone Encounter (Signed)
 Pt c/o medication issue:  1. Name of Medication: Amiodarone   2. How are you currently taking this medication (dosage and times per day)? Not taking   3. Are you having a reaction (difficulty breathing--STAT)? No   4. What is your medication issue? Pt states pharmacy is still sending this medication but Dr Carolynne Citron took her off of it

## 2023-04-02 DIAGNOSIS — I129 Hypertensive chronic kidney disease with stage 1 through stage 4 chronic kidney disease, or unspecified chronic kidney disease: Secondary | ICD-10-CM | POA: Diagnosis not present

## 2023-04-02 DIAGNOSIS — N182 Chronic kidney disease, stage 2 (mild): Secondary | ICD-10-CM | POA: Diagnosis not present

## 2023-04-02 DIAGNOSIS — E1122 Type 2 diabetes mellitus with diabetic chronic kidney disease: Secondary | ICD-10-CM | POA: Diagnosis not present

## 2023-04-02 DIAGNOSIS — I4891 Unspecified atrial fibrillation: Secondary | ICD-10-CM | POA: Diagnosis not present

## 2023-04-02 DIAGNOSIS — I69391 Dysphagia following cerebral infarction: Secondary | ICD-10-CM | POA: Diagnosis not present

## 2023-04-02 DIAGNOSIS — I69354 Hemiplegia and hemiparesis following cerebral infarction affecting left non-dominant side: Secondary | ICD-10-CM | POA: Diagnosis not present

## 2023-04-10 DIAGNOSIS — I10 Essential (primary) hypertension: Secondary | ICD-10-CM | POA: Diagnosis not present

## 2023-04-10 DIAGNOSIS — I480 Paroxysmal atrial fibrillation: Secondary | ICD-10-CM | POA: Diagnosis not present

## 2023-04-10 DIAGNOSIS — M24522 Contracture, left elbow: Secondary | ICD-10-CM | POA: Diagnosis not present

## 2023-04-10 DIAGNOSIS — N3281 Overactive bladder: Secondary | ICD-10-CM | POA: Diagnosis not present

## 2023-04-10 DIAGNOSIS — E1165 Type 2 diabetes mellitus with hyperglycemia: Secondary | ICD-10-CM | POA: Diagnosis not present

## 2023-04-10 DIAGNOSIS — G40909 Epilepsy, unspecified, not intractable, without status epilepticus: Secondary | ICD-10-CM | POA: Diagnosis not present

## 2023-04-10 DIAGNOSIS — E782 Mixed hyperlipidemia: Secondary | ICD-10-CM | POA: Diagnosis not present

## 2023-04-10 DIAGNOSIS — G8190 Hemiplegia, unspecified affecting unspecified side: Secondary | ICD-10-CM | POA: Diagnosis not present

## 2023-04-10 DIAGNOSIS — M25562 Pain in left knee: Secondary | ICD-10-CM | POA: Diagnosis not present

## 2023-04-10 DIAGNOSIS — R001 Bradycardia, unspecified: Secondary | ICD-10-CM | POA: Diagnosis not present

## 2023-04-10 DIAGNOSIS — K219 Gastro-esophageal reflux disease without esophagitis: Secondary | ICD-10-CM | POA: Diagnosis not present

## 2023-04-17 DIAGNOSIS — I129 Hypertensive chronic kidney disease with stage 1 through stage 4 chronic kidney disease, or unspecified chronic kidney disease: Secondary | ICD-10-CM | POA: Diagnosis not present

## 2023-04-17 DIAGNOSIS — I69354 Hemiplegia and hemiparesis following cerebral infarction affecting left non-dominant side: Secondary | ICD-10-CM | POA: Diagnosis not present

## 2023-04-17 DIAGNOSIS — E1122 Type 2 diabetes mellitus with diabetic chronic kidney disease: Secondary | ICD-10-CM | POA: Diagnosis not present

## 2023-04-17 DIAGNOSIS — I4891 Unspecified atrial fibrillation: Secondary | ICD-10-CM | POA: Diagnosis not present

## 2023-04-17 DIAGNOSIS — I69391 Dysphagia following cerebral infarction: Secondary | ICD-10-CM | POA: Diagnosis not present

## 2023-04-17 DIAGNOSIS — N182 Chronic kidney disease, stage 2 (mild): Secondary | ICD-10-CM | POA: Diagnosis not present

## 2023-04-21 NOTE — Progress Notes (Signed)
 Remote pacemaker transmission.

## 2023-04-21 NOTE — Addendum Note (Signed)
Addended by: Geralyn Flash D on: 04/21/2023 04:58 PM   Modules accepted: Orders

## 2023-04-22 DIAGNOSIS — N182 Chronic kidney disease, stage 2 (mild): Secondary | ICD-10-CM | POA: Diagnosis not present

## 2023-04-22 DIAGNOSIS — I69354 Hemiplegia and hemiparesis following cerebral infarction affecting left non-dominant side: Secondary | ICD-10-CM | POA: Diagnosis not present

## 2023-04-22 DIAGNOSIS — E1122 Type 2 diabetes mellitus with diabetic chronic kidney disease: Secondary | ICD-10-CM | POA: Diagnosis not present

## 2023-04-22 DIAGNOSIS — I4891 Unspecified atrial fibrillation: Secondary | ICD-10-CM | POA: Diagnosis not present

## 2023-04-22 DIAGNOSIS — I69391 Dysphagia following cerebral infarction: Secondary | ICD-10-CM | POA: Diagnosis not present

## 2023-04-22 DIAGNOSIS — I129 Hypertensive chronic kidney disease with stage 1 through stage 4 chronic kidney disease, or unspecified chronic kidney disease: Secondary | ICD-10-CM | POA: Diagnosis not present

## 2023-04-22 NOTE — Telephone Encounter (Signed)
 [facility] orders were faxed to [phone]. Confirmation of receipt was received.

## 2023-04-23 DIAGNOSIS — I129 Hypertensive chronic kidney disease with stage 1 through stage 4 chronic kidney disease, or unspecified chronic kidney disease: Secondary | ICD-10-CM | POA: Diagnosis not present

## 2023-04-23 DIAGNOSIS — I4891 Unspecified atrial fibrillation: Secondary | ICD-10-CM | POA: Diagnosis not present

## 2023-04-23 DIAGNOSIS — I69391 Dysphagia following cerebral infarction: Secondary | ICD-10-CM | POA: Diagnosis not present

## 2023-04-23 DIAGNOSIS — I69354 Hemiplegia and hemiparesis following cerebral infarction affecting left non-dominant side: Secondary | ICD-10-CM | POA: Diagnosis not present

## 2023-04-23 DIAGNOSIS — E1122 Type 2 diabetes mellitus with diabetic chronic kidney disease: Secondary | ICD-10-CM | POA: Diagnosis not present

## 2023-04-23 DIAGNOSIS — N182 Chronic kidney disease, stage 2 (mild): Secondary | ICD-10-CM | POA: Diagnosis not present

## 2023-05-27 ENCOUNTER — Encounter: Payer: Self-pay | Admitting: Internal Medicine

## 2023-05-27 DIAGNOSIS — E1165 Type 2 diabetes mellitus with hyperglycemia: Secondary | ICD-10-CM | POA: Diagnosis not present

## 2023-05-27 DIAGNOSIS — N3281 Overactive bladder: Secondary | ICD-10-CM | POA: Diagnosis not present

## 2023-05-27 DIAGNOSIS — G8190 Hemiplegia, unspecified affecting unspecified side: Secondary | ICD-10-CM | POA: Diagnosis not present

## 2023-05-27 DIAGNOSIS — R001 Bradycardia, unspecified: Secondary | ICD-10-CM | POA: Diagnosis not present

## 2023-05-27 DIAGNOSIS — I480 Paroxysmal atrial fibrillation: Secondary | ICD-10-CM | POA: Diagnosis not present

## 2023-05-27 DIAGNOSIS — M24522 Contracture, left elbow: Secondary | ICD-10-CM | POA: Diagnosis not present

## 2023-05-27 DIAGNOSIS — E782 Mixed hyperlipidemia: Secondary | ICD-10-CM | POA: Diagnosis not present

## 2023-05-27 DIAGNOSIS — K219 Gastro-esophageal reflux disease without esophagitis: Secondary | ICD-10-CM | POA: Diagnosis not present

## 2023-05-27 DIAGNOSIS — I10 Essential (primary) hypertension: Secondary | ICD-10-CM | POA: Diagnosis not present

## 2023-05-27 DIAGNOSIS — G40909 Epilepsy, unspecified, not intractable, without status epilepticus: Secondary | ICD-10-CM | POA: Diagnosis not present

## 2023-05-27 DIAGNOSIS — M25562 Pain in left knee: Secondary | ICD-10-CM | POA: Diagnosis not present

## 2023-06-01 ENCOUNTER — Telehealth: Payer: Self-pay | Admitting: Neurology

## 2023-06-01 NOTE — Telephone Encounter (Signed)
 Pt's aid called and LVM stating that the pt is needing early morning appts around 9-9:30am due to her having somewhere else to be after her appts. She stated that they will go ahead and keep this one unless an earlier appt becomes available. I called pt to get VO from her that it was ok to speak to aid since she was not on the DPR. Pt's aid is: Corrie Dandy [phone]

## 2023-06-03 ENCOUNTER — Ambulatory Visit: Payer: Medicare Other | Admitting: Neurology

## 2023-06-09 ENCOUNTER — Ambulatory Visit (INDEPENDENT_AMBULATORY_CARE_PROVIDER_SITE_OTHER): Payer: Medicare Other

## 2023-06-09 DIAGNOSIS — I495 Sick sinus syndrome: Secondary | ICD-10-CM

## 2023-06-09 LAB — CUP PACEART REMOTE DEVICE CHECK
Battery Remaining Longevity: 91 mo
Battery Remaining Percentage: 75 %
Battery Voltage: 3.01 V
Brady Statistic AP VP Percent: 0 %
Brady Statistic AP VS Percent: 0 %
Brady Statistic AS VP Percent: 0 %
Brady Statistic AS VS Percent: 0 %
Brady Statistic RA Percent Paced: 0 %
Brady Statistic RV Percent Paced: 8.3 %
Date Time Interrogation Session: 20250325020012
Implantable Lead Connection Status: 753985
Implantable Lead Connection Status: 753985
Implantable Lead Implant Date: 20220608
Implantable Lead Implant Date: 20220608
Implantable Lead Location: 753859
Implantable Lead Location: 753860
Implantable Pulse Generator Implant Date: 20220608
Lead Channel Impedance Value: 480 Ohm
Lead Channel Impedance Value: 560 Ohm
Lead Channel Pacing Threshold Amplitude: 0.5 V
Lead Channel Pacing Threshold Amplitude: 0.75 V
Lead Channel Pacing Threshold Pulse Width: 0.5 ms
Lead Channel Pacing Threshold Pulse Width: 0.5 ms
Lead Channel Sensing Intrinsic Amplitude: 11.2 mV
Lead Channel Sensing Intrinsic Amplitude: 4.5 mV
Lead Channel Setting Pacing Amplitude: 2 V
Lead Channel Setting Pacing Amplitude: 2.5 V
Lead Channel Setting Pacing Pulse Width: 0.5 ms
Lead Channel Setting Sensing Sensitivity: 2 mV
Pulse Gen Model: 2272
Pulse Gen Serial Number: 6462574

## 2023-06-10 ENCOUNTER — Ambulatory Visit: Payer: Medicare Other | Admitting: Neurology

## 2023-06-16 ENCOUNTER — Ambulatory Visit (INDEPENDENT_AMBULATORY_CARE_PROVIDER_SITE_OTHER): Payer: Medicare Other | Admitting: Neurology

## 2023-06-16 ENCOUNTER — Encounter: Payer: Self-pay | Admitting: Neurology

## 2023-06-16 VITALS — BP 140/88 | HR 88

## 2023-06-16 DIAGNOSIS — I69354 Hemiplegia and hemiparesis following cerebral infarction affecting left non-dominant side: Secondary | ICD-10-CM

## 2023-06-16 DIAGNOSIS — I63131 Cerebral infarction due to embolism of right carotid artery: Secondary | ICD-10-CM

## 2023-06-16 MED ORDER — INCOBOTULINUMTOXINA 100 UNITS IM SOLR: 600.0000 [IU] | INTRAMUSCULAR | Status: AC

## 2023-06-16 NOTE — Progress Notes (Addendum)
 ASSESSMENT AND PLAN  Rachel Fox is a 78 y.o. female   Assessment and Plan    Post-Stroke complex partial seizures Seizures began after stroke in 2020, with 4 episodes in 2021 and a recent episode in September 2024. Patient is compliant with Keppra 500 mg twice a day, level was 28.9. -Increase Keppra from 500mg  to 750mg  twice daily. -Order EEG   Post-Stroke spastic left hemiparesis hemiparesis Left-sided weakness and spasticity since stroke in 2020, with significant impact on balance and mobility also have developed significant muscle spasm and pain, previous Botox injections provided temporary relief.  Electrical stimulation guided xeomin injection for spastic left hemiparesis, used 600 units 100 units of xeomin/2 cc of normal saline    Left pectoralis major 100 units (divided into 4 injection sites) Left latissimus dorsi 100 units (divided into 4 injection sites) Left brachialis 100 units(divided into 4 injection sites) Left pronator teres 50 units(divided into 2 injection sites) Left flexor digitorum profundus 50 units(divided into 2 injection sites) Left palmaris longus 50 units Left brachioradialis 50 units  Only return to clinic for repeat injection if she noticed improvement   DIAGNOSTIC DATA (LABS, IMAGING, TESTING) - I reviewed patient records, labs, notes, testing and imaging myself where available.  Laboratory results from Dr. Nita Sells April 10, 2023, A1c was 6.1, Keppra level 18.7, LDL 67, creatinine 1.03, GFR 57 hemoglobin of 15.2 MEDICAL HISTORY:  Rachel Fox, is a 78 year old female, accompanied by her caregiver, seen in request by   her primary care from [place], [place] for evaluation of complex partial seizure   History is obtained from the patient and review of electronic medical records. I personally reviewed pertinent available imaging films in PACS.   PMHx of  HTN HLD Stroke Seizure, S/p pacemaker in 2022.    Discussed the use  of AI scribe software for clinical note transcription with the patient, who gave verbal consent to proceed.  The patient, with a history of stroke in 2020, embolic large right MCA stroke, due to newly diagnosed atrial fibrillation, on Eliquis treatment and subsequent seizures, presents with ongoing difficulty with balance and mobility. She reports needing a walker for mobility since her hospital discharge following the stroke. She also reports issues with her left arm, which has been treated with Botox injections in the past but Dr. Judithann Sheen, who retired in December 2022, but the treatment has not resulted in significant improvement.  Did help her left arm spasticity some  The patient's seizures, which began in 2021, had 4 seizure in 2021, have been controlled with Keppra, 500 mg twice a day since then, until  recent episode in September, 2024.  Presenting with confusion, sweaty, seizure activity lasting 5 minutes, Keppra level was 28.9  She complains of increased left arm spasticity and pain, also worsening gait abnormality, urinary incontinence    LABS Keppra level: 28.9 (11/2022) Cr: 1.28 GFR: 44 Hb: 15.1  RADIOLOGY Brain CT: Large right MCA stroke (08/2019) Neck CT: Cervical arthritis, osteophyte formation, bone irregularity, and spinal canal stenosis, not a candidate for MRI due to pacemaker  UPDATE Mar 04 2023: She comes pains of left arm and shoulder pain with passive stretch, potential side effect was explained xeomin injection, consent form was signed  Update June 16, 2023 She was not sure about the benefit of the injection, no significant side effect noted  PHYSICAL EXAM:   Vitals:   06/16/23 1027  BP: (!) 140/88  Pulse: 88   There is no height  or weight on file to calculate BMI.  PHYSICAL EXAMNIATION:  NEUROLOGICAL EXAM:  MENTAL STATUS: Speech/cognition: Awake, alert, oriented to history taking and casual conversation CRANIAL NERVES: CN II: Visual fields are full  to confrontation. Pupils are round equal and briskly reactive to light. CN III, IV, VI: extraocular movement are normal. No ptosis. CN V: Facial sensation is intact to light touch CN VII: Left lower face weakness CN VIII: Hearing is normal to causal conversation. CN IX, X: Phonation is normal. CN XI: Head turning and shoulder shrug are intact  MOTOR: Spastic left hemiparesis, limited range of motion of left shoulder, painful with passive stretch, tendency for elbow flexion pronation, maximum elbow extension 150 degree, finger flexion, full range of motion of finger,  Profound left lower extremity weakness, left hip flexion 3, knee flexion 3, extension 4, left ankle in brace  REFLEXES: Hyperreflexia of bilateral upper and lower extremity, left worse than right,  SENSORY: Intact to light touch, pinprick and vibratory sensation are intact in fingers and toes.  COORDINATION: There is no trunk or limb dysmetria noted.  GAIT/STANCE: Deferred  REVIEW OF SYSTEMS:  Full 14 system review of systems performed and notable only for as above All other review of systems were negative.   ALLERGIES: Allergies  Allergen Reactions   Lactose Intolerance (Gi) Other (See Comments)    Causes Gas in patient.    HOME MEDICATIONS: Current Outpatient Medications  Medication Sig Dispense Refill   acetaminophen (TYLENOL) 650 MG CR tablet Take 650 mg by mouth 2 (two) times daily.     amiodarone (PACERONE) 100 MG tablet Take 100 mg by mouth daily.     amLODipine (NORVASC) 5 MG tablet Take 1 tablet (5 mg total) by mouth daily. 30 tablet 0   apixaban (ELIQUIS) 2.5 MG TABS tablet Take 1 tablet (2.5 mg total) by mouth 2 (two) times daily. 60 tablet 0   atorvastatin (LIPITOR) 80 MG tablet Take 1 tablet (80 mg total) by mouth daily at 6 PM. 30 tablet 0   FARXIGA 10 MG TABS tablet Take 10 mg by mouth daily.     lactase (LACTAID) 3000 units tablet Take 3,000 Units by mouth as needed.     levETIRAcetam (KEPPRA)  750 MG tablet Take 1 tablet (750 mg total) by mouth 2 (two) times daily. 180 tablet 3   lidocaine (LIDOCAINE PAIN RELIEF) 4 % Place 1 patch onto the skin daily. (Patient taking differently: Place 1 patch onto the skin daily as needed.) 30 patch 0   metoprolol tartrate (LOPRESSOR) 25 MG tablet Take 0.5 tablets (12.5 mg total) by mouth 2 (two) times daily. 30 tablet 0   Current Facility-Administered Medications  Medication Dose Route Frequency Provider Last Rate Last Admin   incobotulinumtoxinA (XEOMIN) 100 units injection 600 Units  600 Units Intramuscular Q90 days Levert Feinstein, MD   600 Units at 03/05/23 1310   incobotulinumtoxinA (XEOMIN) 100 units injection 600 Units  600 Units Intramuscular Q90 days Levert Feinstein, MD        PAST MEDICAL HISTORY: Past Medical History:  Diagnosis Date   Atrial fibrillation [facility])    Cerebrovascular accident (CVA) with involvement of left side of body (HCC) 09/20/2018   High cholesterol    Hypertension    Seizures (HCC)    Type 2 diabetes mellitus (HCC)     PAST SURGICAL HISTORY: Past Surgical History:  Procedure Laterality Date   ABDOMINAL HYSTERECTOMY     COLONOSCOPY WITH PROPOFOL N/A 10/20/2020   Procedure: COLONOSCOPY  WITH PROPOFOL;  Surgeon: Malissa Hippo, MD;  Location: [facility];  Service: Endoscopy;  Laterality: N/A;   ESOPHAGOGASTRODUODENOSCOPY (EGD) WITH PROPOFOL N/A 10/20/2020   Procedure: ESOPHAGOGASTRODUODENOSCOPY (EGD) WITH PROPOFOL;  Surgeon: Malissa Hippo, MD;  Location: [facility];  Service: Endoscopy;  Laterality: N/A;   HIP ARTHROPLASTY Left 10/10/2020   Procedure: ARTHROPLASTY BIPOLAR HIP (HEMIARTHROPLASTY);  Surgeon: Oliver Barre, MD;  Location: [facility];  Service: Orthopedics;  Laterality: Left;   PACEMAKER IMPLANT N/A 08/22/2020   Procedure: PACEMAKER IMPLANT;  Surgeon: Marinus Maw, MD;  Location: [facility];  Service: Cardiovascular;  Laterality: N/A;   TUBAL LIGATION      FAMILY HISTORY: Family History   Problem Relation Age of Onset   Stroke Mother    Heart attack Father    Stroke Sister    Heart attack Sister     SOCIAL HISTORY: Social History   Socioeconomic History   Marital status: Divorced    Spouse name: Not on file   Number of children: Not on file   Years of education: Not on file   Highest education level: Not on file  Occupational History   Not on file  Tobacco Use   Smoking status: Former    Current packs/day: 0.00    Types: Cigarettes    Quit date: 04/14/1988    Years since quitting: 35.1   Smokeless tobacco: Never  Vaping Use   Vaping status: Never Used  Substance and Sexual Activity   Alcohol use: Never   Drug use: Never   Sexual activity: Not on file  Other Topics Concern   Not on file  Social History Narrative   Not on file   Social Drivers of Health   Financial Resource Strain: Not on file  Food Insecurity: Not on file  Transportation Needs: Not on file  Physical Activity: Not on file  Stress: Not on file  Social Connections: Not on file  Intimate Partner Violence: Not on file      Levert Feinstein, M.D. Ph.D.  [facility] [address], [address] [place], [place] [address] Ph: [phone] Fax: [phone]  CC:  Benita Stabile, MD [address] [place],  [place] [address]  Benita Stabile, MD

## 2023-06-16 NOTE — Progress Notes (Signed)
 xeomin 100units x 6 vial  [id number] Exp-2027/08 B/B Bacteriostatic 0.9% Sodium Chloride- 12mL  [id number] Expiration: 01/16/24 NDC: [id number] Dx:  M84.132, I63.131    WITNESSED GM:[id number] cma

## 2023-07-24 NOTE — Progress Notes (Signed)
 Remote pacemaker transmission.

## 2023-08-06 DIAGNOSIS — E1165 Type 2 diabetes mellitus with hyperglycemia: Secondary | ICD-10-CM | POA: Diagnosis not present

## 2023-08-06 DIAGNOSIS — E782 Mixed hyperlipidemia: Secondary | ICD-10-CM | POA: Diagnosis not present

## 2023-08-06 DIAGNOSIS — M8580 Other specified disorders of bone density and structure, unspecified site: Secondary | ICD-10-CM | POA: Diagnosis not present

## 2023-08-06 DIAGNOSIS — E559 Vitamin D deficiency, unspecified: Secondary | ICD-10-CM | POA: Diagnosis not present

## 2023-08-12 DIAGNOSIS — I10 Essential (primary) hypertension: Secondary | ICD-10-CM | POA: Diagnosis not present

## 2023-08-12 DIAGNOSIS — N3281 Overactive bladder: Secondary | ICD-10-CM | POA: Diagnosis not present

## 2023-08-12 DIAGNOSIS — I69354 Hemiplegia and hemiparesis following cerebral infarction affecting left non-dominant side: Secondary | ICD-10-CM | POA: Diagnosis not present

## 2023-08-12 DIAGNOSIS — G40909 Epilepsy, unspecified, not intractable, without status epilepticus: Secondary | ICD-10-CM | POA: Diagnosis not present

## 2023-08-12 DIAGNOSIS — R001 Bradycardia, unspecified: Secondary | ICD-10-CM | POA: Diagnosis not present

## 2023-08-12 DIAGNOSIS — I480 Paroxysmal atrial fibrillation: Secondary | ICD-10-CM | POA: Diagnosis not present

## 2023-08-12 DIAGNOSIS — M25562 Pain in left knee: Secondary | ICD-10-CM | POA: Diagnosis not present

## 2023-08-12 DIAGNOSIS — E1165 Type 2 diabetes mellitus with hyperglycemia: Secondary | ICD-10-CM | POA: Diagnosis not present

## 2023-08-12 DIAGNOSIS — K219 Gastro-esophageal reflux disease without esophagitis: Secondary | ICD-10-CM | POA: Diagnosis not present

## 2023-08-12 DIAGNOSIS — E782 Mixed hyperlipidemia: Secondary | ICD-10-CM | POA: Diagnosis not present

## 2023-08-12 DIAGNOSIS — H6121 Impacted cerumen, right ear: Secondary | ICD-10-CM | POA: Diagnosis not present

## 2023-08-12 DIAGNOSIS — K5909 Other constipation: Secondary | ICD-10-CM | POA: Diagnosis not present

## 2023-08-15 DIAGNOSIS — G40909 Epilepsy, unspecified, not intractable, without status epilepticus: Secondary | ICD-10-CM | POA: Diagnosis not present

## 2023-08-15 DIAGNOSIS — E1165 Type 2 diabetes mellitus with hyperglycemia: Secondary | ICD-10-CM | POA: Diagnosis not present

## 2023-08-15 DIAGNOSIS — I10 Essential (primary) hypertension: Secondary | ICD-10-CM | POA: Diagnosis not present

## 2023-08-15 DIAGNOSIS — E782 Mixed hyperlipidemia: Secondary | ICD-10-CM | POA: Diagnosis not present

## 2023-08-18 DIAGNOSIS — M24522 Contracture, left elbow: Secondary | ICD-10-CM | POA: Diagnosis not present

## 2023-08-18 DIAGNOSIS — E870 Hyperosmolality and hypernatremia: Secondary | ICD-10-CM | POA: Diagnosis not present

## 2023-08-18 DIAGNOSIS — E1165 Type 2 diabetes mellitus with hyperglycemia: Secondary | ICD-10-CM | POA: Diagnosis not present

## 2023-08-18 DIAGNOSIS — N3281 Overactive bladder: Secondary | ICD-10-CM | POA: Diagnosis not present

## 2023-08-18 DIAGNOSIS — G40909 Epilepsy, unspecified, not intractable, without status epilepticus: Secondary | ICD-10-CM | POA: Diagnosis not present

## 2023-08-18 DIAGNOSIS — R001 Bradycardia, unspecified: Secondary | ICD-10-CM | POA: Diagnosis not present

## 2023-08-18 DIAGNOSIS — I480 Paroxysmal atrial fibrillation: Secondary | ICD-10-CM | POA: Diagnosis not present

## 2023-08-18 DIAGNOSIS — I10 Essential (primary) hypertension: Secondary | ICD-10-CM | POA: Diagnosis not present

## 2023-08-18 DIAGNOSIS — E782 Mixed hyperlipidemia: Secondary | ICD-10-CM | POA: Diagnosis not present

## 2023-08-18 DIAGNOSIS — D751 Secondary polycythemia: Secondary | ICD-10-CM | POA: Diagnosis not present

## 2023-08-18 DIAGNOSIS — G8194 Hemiplegia, unspecified affecting left nondominant side: Secondary | ICD-10-CM | POA: Diagnosis not present

## 2023-08-25 DIAGNOSIS — E782 Mixed hyperlipidemia: Secondary | ICD-10-CM | POA: Diagnosis not present

## 2023-08-25 DIAGNOSIS — E1165 Type 2 diabetes mellitus with hyperglycemia: Secondary | ICD-10-CM | POA: Diagnosis not present

## 2023-08-25 DIAGNOSIS — M25562 Pain in left knee: Secondary | ICD-10-CM | POA: Diagnosis not present

## 2023-08-25 DIAGNOSIS — G40909 Epilepsy, unspecified, not intractable, without status epilepticus: Secondary | ICD-10-CM | POA: Diagnosis not present

## 2023-08-25 DIAGNOSIS — I69354 Hemiplegia and hemiparesis following cerebral infarction affecting left non-dominant side: Secondary | ICD-10-CM | POA: Diagnosis not present

## 2023-08-25 DIAGNOSIS — I480 Paroxysmal atrial fibrillation: Secondary | ICD-10-CM | POA: Diagnosis not present

## 2023-08-25 DIAGNOSIS — E870 Hyperosmolality and hypernatremia: Secondary | ICD-10-CM | POA: Diagnosis not present

## 2023-08-25 DIAGNOSIS — K219 Gastro-esophageal reflux disease without esophagitis: Secondary | ICD-10-CM | POA: Diagnosis not present

## 2023-08-25 DIAGNOSIS — I10 Essential (primary) hypertension: Secondary | ICD-10-CM | POA: Diagnosis not present

## 2023-08-25 DIAGNOSIS — M8580 Other specified disorders of bone density and structure, unspecified site: Secondary | ICD-10-CM | POA: Diagnosis not present

## 2023-08-25 DIAGNOSIS — N3281 Overactive bladder: Secondary | ICD-10-CM | POA: Diagnosis not present

## 2023-08-25 DIAGNOSIS — K5909 Other constipation: Secondary | ICD-10-CM | POA: Diagnosis not present

## 2023-09-08 ENCOUNTER — Ambulatory Visit (INDEPENDENT_AMBULATORY_CARE_PROVIDER_SITE_OTHER): Payer: Medicare Other

## 2023-09-08 DIAGNOSIS — I495 Sick sinus syndrome: Secondary | ICD-10-CM | POA: Diagnosis not present

## 2023-09-08 LAB — CUP PACEART REMOTE DEVICE CHECK
Battery Remaining Longevity: 87 mo
Battery Remaining Percentage: 73 %
Battery Voltage: 3.01 V
Brady Statistic AP VP Percent: 0 %
Brady Statistic AP VS Percent: 0 %
Brady Statistic AS VP Percent: 0 %
Brady Statistic AS VS Percent: 0 %
Brady Statistic RA Percent Paced: 0 %
Brady Statistic RV Percent Paced: 8.8 %
Date Time Interrogation Session: 20250624020015
Implantable Lead Connection Status: 753985
Implantable Lead Connection Status: 753985
Implantable Lead Implant Date: 20220608
Implantable Lead Implant Date: 20220608
Implantable Lead Location: 753859
Implantable Lead Location: 753860
Implantable Pulse Generator Implant Date: 20220608
Lead Channel Impedance Value: 460 Ohm
Lead Channel Impedance Value: 560 Ohm
Lead Channel Pacing Threshold Amplitude: 0.5 V
Lead Channel Pacing Threshold Amplitude: 0.75 V
Lead Channel Pacing Threshold Pulse Width: 0.5 ms
Lead Channel Pacing Threshold Pulse Width: 0.5 ms
Lead Channel Sensing Intrinsic Amplitude: 10.6 mV
Lead Channel Sensing Intrinsic Amplitude: 4.5 mV
Lead Channel Setting Pacing Amplitude: 2 V
Lead Channel Setting Pacing Amplitude: 2.5 V
Lead Channel Setting Pacing Pulse Width: 0.5 ms
Lead Channel Setting Sensing Sensitivity: 2 mV
Pulse Gen Model: 2272
Pulse Gen Serial Number: 6462574

## 2023-09-11 DIAGNOSIS — G40909 Epilepsy, unspecified, not intractable, without status epilepticus: Secondary | ICD-10-CM | POA: Diagnosis not present

## 2023-09-11 DIAGNOSIS — K5909 Other constipation: Secondary | ICD-10-CM | POA: Diagnosis not present

## 2023-09-11 DIAGNOSIS — E870 Hyperosmolality and hypernatremia: Secondary | ICD-10-CM | POA: Diagnosis not present

## 2023-09-11 DIAGNOSIS — I10 Essential (primary) hypertension: Secondary | ICD-10-CM | POA: Diagnosis not present

## 2023-09-11 DIAGNOSIS — I69354 Hemiplegia and hemiparesis following cerebral infarction affecting left non-dominant side: Secondary | ICD-10-CM | POA: Diagnosis not present

## 2023-09-11 DIAGNOSIS — M25562 Pain in left knee: Secondary | ICD-10-CM | POA: Diagnosis not present

## 2023-09-11 DIAGNOSIS — E782 Mixed hyperlipidemia: Secondary | ICD-10-CM | POA: Diagnosis not present

## 2023-09-11 DIAGNOSIS — M8580 Other specified disorders of bone density and structure, unspecified site: Secondary | ICD-10-CM | POA: Diagnosis not present

## 2023-09-11 DIAGNOSIS — K219 Gastro-esophageal reflux disease without esophagitis: Secondary | ICD-10-CM | POA: Diagnosis not present

## 2023-09-11 DIAGNOSIS — I480 Paroxysmal atrial fibrillation: Secondary | ICD-10-CM | POA: Diagnosis not present

## 2023-09-11 DIAGNOSIS — E1165 Type 2 diabetes mellitus with hyperglycemia: Secondary | ICD-10-CM | POA: Diagnosis not present

## 2023-09-11 DIAGNOSIS — N3281 Overactive bladder: Secondary | ICD-10-CM | POA: Diagnosis not present

## 2023-09-13 ENCOUNTER — Ambulatory Visit: Payer: Self-pay | Admitting: Internal Medicine

## 2023-09-14 DIAGNOSIS — N3281 Overactive bladder: Secondary | ICD-10-CM | POA: Diagnosis not present

## 2023-09-14 DIAGNOSIS — I10 Essential (primary) hypertension: Secondary | ICD-10-CM | POA: Diagnosis not present

## 2023-09-14 DIAGNOSIS — E1165 Type 2 diabetes mellitus with hyperglycemia: Secondary | ICD-10-CM | POA: Diagnosis not present

## 2023-09-14 DIAGNOSIS — I480 Paroxysmal atrial fibrillation: Secondary | ICD-10-CM | POA: Diagnosis not present

## 2023-09-16 ENCOUNTER — Ambulatory Visit: Admitting: Neurology

## 2023-10-02 DIAGNOSIS — E782 Mixed hyperlipidemia: Secondary | ICD-10-CM | POA: Diagnosis not present

## 2023-10-02 DIAGNOSIS — K5909 Other constipation: Secondary | ICD-10-CM | POA: Diagnosis not present

## 2023-10-02 DIAGNOSIS — M25562 Pain in left knee: Secondary | ICD-10-CM | POA: Diagnosis not present

## 2023-10-02 DIAGNOSIS — N3281 Overactive bladder: Secondary | ICD-10-CM | POA: Diagnosis not present

## 2023-10-02 DIAGNOSIS — I10 Essential (primary) hypertension: Secondary | ICD-10-CM | POA: Diagnosis not present

## 2023-10-02 DIAGNOSIS — E870 Hyperosmolality and hypernatremia: Secondary | ICD-10-CM | POA: Diagnosis not present

## 2023-10-02 DIAGNOSIS — K219 Gastro-esophageal reflux disease without esophagitis: Secondary | ICD-10-CM | POA: Diagnosis not present

## 2023-10-02 DIAGNOSIS — I480 Paroxysmal atrial fibrillation: Secondary | ICD-10-CM | POA: Diagnosis not present

## 2023-10-02 DIAGNOSIS — M8580 Other specified disorders of bone density and structure, unspecified site: Secondary | ICD-10-CM | POA: Diagnosis not present

## 2023-10-02 DIAGNOSIS — E1165 Type 2 diabetes mellitus with hyperglycemia: Secondary | ICD-10-CM | POA: Diagnosis not present

## 2023-10-02 DIAGNOSIS — G40909 Epilepsy, unspecified, not intractable, without status epilepticus: Secondary | ICD-10-CM | POA: Diagnosis not present

## 2023-10-02 DIAGNOSIS — I69354 Hemiplegia and hemiparesis following cerebral infarction affecting left non-dominant side: Secondary | ICD-10-CM | POA: Diagnosis not present

## 2023-10-15 DIAGNOSIS — I480 Paroxysmal atrial fibrillation: Secondary | ICD-10-CM | POA: Diagnosis not present

## 2023-10-15 DIAGNOSIS — I10 Essential (primary) hypertension: Secondary | ICD-10-CM | POA: Diagnosis not present

## 2023-10-15 DIAGNOSIS — E1165 Type 2 diabetes mellitus with hyperglycemia: Secondary | ICD-10-CM | POA: Diagnosis not present

## 2023-10-15 DIAGNOSIS — E782 Mixed hyperlipidemia: Secondary | ICD-10-CM | POA: Diagnosis not present

## 2023-11-03 DIAGNOSIS — E870 Hyperosmolality and hypernatremia: Secondary | ICD-10-CM | POA: Diagnosis not present

## 2023-11-03 DIAGNOSIS — G40909 Epilepsy, unspecified, not intractable, without status epilepticus: Secondary | ICD-10-CM | POA: Diagnosis not present

## 2023-11-03 DIAGNOSIS — K5909 Other constipation: Secondary | ICD-10-CM | POA: Diagnosis not present

## 2023-11-03 DIAGNOSIS — I480 Paroxysmal atrial fibrillation: Secondary | ICD-10-CM | POA: Diagnosis not present

## 2023-11-03 DIAGNOSIS — M8580 Other specified disorders of bone density and structure, unspecified site: Secondary | ICD-10-CM | POA: Diagnosis not present

## 2023-11-03 DIAGNOSIS — M25562 Pain in left knee: Secondary | ICD-10-CM | POA: Diagnosis not present

## 2023-11-03 DIAGNOSIS — I69354 Hemiplegia and hemiparesis following cerebral infarction affecting left non-dominant side: Secondary | ICD-10-CM | POA: Diagnosis not present

## 2023-11-03 DIAGNOSIS — I10 Essential (primary) hypertension: Secondary | ICD-10-CM | POA: Diagnosis not present

## 2023-11-03 DIAGNOSIS — E1165 Type 2 diabetes mellitus with hyperglycemia: Secondary | ICD-10-CM | POA: Diagnosis not present

## 2023-11-03 DIAGNOSIS — N3281 Overactive bladder: Secondary | ICD-10-CM | POA: Diagnosis not present

## 2023-11-03 DIAGNOSIS — K219 Gastro-esophageal reflux disease without esophagitis: Secondary | ICD-10-CM | POA: Diagnosis not present

## 2023-11-03 DIAGNOSIS — E782 Mixed hyperlipidemia: Secondary | ICD-10-CM | POA: Diagnosis not present

## 2023-11-13 DIAGNOSIS — E1165 Type 2 diabetes mellitus with hyperglycemia: Secondary | ICD-10-CM | POA: Diagnosis not present

## 2023-11-13 DIAGNOSIS — I10 Essential (primary) hypertension: Secondary | ICD-10-CM | POA: Diagnosis not present

## 2023-11-13 DIAGNOSIS — I480 Paroxysmal atrial fibrillation: Secondary | ICD-10-CM | POA: Diagnosis not present

## 2023-11-13 DIAGNOSIS — E782 Mixed hyperlipidemia: Secondary | ICD-10-CM | POA: Diagnosis not present

## 2023-11-25 ENCOUNTER — Other Ambulatory Visit: Payer: Self-pay | Admitting: Neurology

## 2023-12-01 DIAGNOSIS — K5909 Other constipation: Secondary | ICD-10-CM | POA: Diagnosis not present

## 2023-12-01 DIAGNOSIS — I69354 Hemiplegia and hemiparesis following cerebral infarction affecting left non-dominant side: Secondary | ICD-10-CM | POA: Diagnosis not present

## 2023-12-01 DIAGNOSIS — I480 Paroxysmal atrial fibrillation: Secondary | ICD-10-CM | POA: Diagnosis not present

## 2023-12-01 DIAGNOSIS — E870 Hyperosmolality and hypernatremia: Secondary | ICD-10-CM | POA: Diagnosis not present

## 2023-12-01 DIAGNOSIS — M8580 Other specified disorders of bone density and structure, unspecified site: Secondary | ICD-10-CM | POA: Diagnosis not present

## 2023-12-01 DIAGNOSIS — I10 Essential (primary) hypertension: Secondary | ICD-10-CM | POA: Diagnosis not present

## 2023-12-01 DIAGNOSIS — G40909 Epilepsy, unspecified, not intractable, without status epilepticus: Secondary | ICD-10-CM | POA: Diagnosis not present

## 2023-12-01 DIAGNOSIS — M25562 Pain in left knee: Secondary | ICD-10-CM | POA: Diagnosis not present

## 2023-12-01 DIAGNOSIS — E782 Mixed hyperlipidemia: Secondary | ICD-10-CM | POA: Diagnosis not present

## 2023-12-01 DIAGNOSIS — E1165 Type 2 diabetes mellitus with hyperglycemia: Secondary | ICD-10-CM | POA: Diagnosis not present

## 2023-12-01 DIAGNOSIS — N3281 Overactive bladder: Secondary | ICD-10-CM | POA: Diagnosis not present

## 2023-12-01 DIAGNOSIS — K219 Gastro-esophageal reflux disease without esophagitis: Secondary | ICD-10-CM | POA: Diagnosis not present

## 2023-12-07 DIAGNOSIS — L600 Ingrowing nail: Secondary | ICD-10-CM | POA: Diagnosis not present

## 2023-12-07 DIAGNOSIS — M79674 Pain in right toe(s): Secondary | ICD-10-CM | POA: Diagnosis not present

## 2023-12-07 DIAGNOSIS — I739 Peripheral vascular disease, unspecified: Secondary | ICD-10-CM | POA: Diagnosis not present

## 2023-12-07 DIAGNOSIS — B351 Tinea unguium: Secondary | ICD-10-CM | POA: Diagnosis not present

## 2023-12-07 DIAGNOSIS — M79675 Pain in left toe(s): Secondary | ICD-10-CM | POA: Diagnosis not present

## 2023-12-08 ENCOUNTER — Ambulatory Visit (INDEPENDENT_AMBULATORY_CARE_PROVIDER_SITE_OTHER): Payer: Medicare Other

## 2023-12-08 DIAGNOSIS — I495 Sick sinus syndrome: Secondary | ICD-10-CM

## 2023-12-08 LAB — CUP PACEART REMOTE DEVICE CHECK
Battery Remaining Longevity: 84 mo
Battery Remaining Percentage: 70 %
Battery Voltage: 2.99 V
Brady Statistic AP VP Percent: 0 %
Brady Statistic AP VS Percent: 0 %
Brady Statistic AS VP Percent: 0 %
Brady Statistic AS VS Percent: 0 %
Brady Statistic RA Percent Paced: 0 %
Brady Statistic RV Percent Paced: 8.1 %
Date Time Interrogation Session: 20250923020019
Implantable Lead Connection Status: 753985
Implantable Lead Connection Status: 753985
Implantable Lead Implant Date: 20220608
Implantable Lead Implant Date: 20220608
Implantable Lead Location: 753859
Implantable Lead Location: 753860
Implantable Pulse Generator Implant Date: 20220608
Lead Channel Impedance Value: 480 Ohm
Lead Channel Impedance Value: 590 Ohm
Lead Channel Pacing Threshold Amplitude: 0.5 V
Lead Channel Pacing Threshold Amplitude: 0.75 V
Lead Channel Pacing Threshold Pulse Width: 0.5 ms
Lead Channel Pacing Threshold Pulse Width: 0.5 ms
Lead Channel Sensing Intrinsic Amplitude: 11.8 mV
Lead Channel Sensing Intrinsic Amplitude: 4.5 mV
Lead Channel Setting Pacing Amplitude: 2 V
Lead Channel Setting Pacing Amplitude: 2.5 V
Lead Channel Setting Pacing Pulse Width: 0.5 ms
Lead Channel Setting Sensing Sensitivity: 2 mV
Pulse Gen Model: 2272
Pulse Gen Serial Number: 6462574

## 2023-12-09 NOTE — Progress Notes (Signed)
 Remote PPM Transmission

## 2023-12-11 DIAGNOSIS — E1165 Type 2 diabetes mellitus with hyperglycemia: Secondary | ICD-10-CM | POA: Diagnosis not present

## 2023-12-11 DIAGNOSIS — M8580 Other specified disorders of bone density and structure, unspecified site: Secondary | ICD-10-CM | POA: Diagnosis not present

## 2023-12-11 DIAGNOSIS — G40909 Epilepsy, unspecified, not intractable, without status epilepticus: Secondary | ICD-10-CM | POA: Diagnosis not present

## 2023-12-11 DIAGNOSIS — E559 Vitamin D deficiency, unspecified: Secondary | ICD-10-CM | POA: Diagnosis not present

## 2023-12-11 DIAGNOSIS — E782 Mixed hyperlipidemia: Secondary | ICD-10-CM | POA: Diagnosis not present

## 2023-12-13 ENCOUNTER — Ambulatory Visit: Payer: Self-pay | Admitting: Internal Medicine

## 2023-12-15 DIAGNOSIS — E782 Mixed hyperlipidemia: Secondary | ICD-10-CM | POA: Diagnosis not present

## 2023-12-15 DIAGNOSIS — I10 Essential (primary) hypertension: Secondary | ICD-10-CM | POA: Diagnosis not present

## 2023-12-15 DIAGNOSIS — E1165 Type 2 diabetes mellitus with hyperglycemia: Secondary | ICD-10-CM | POA: Diagnosis not present

## 2023-12-15 DIAGNOSIS — R32 Unspecified urinary incontinence: Secondary | ICD-10-CM | POA: Diagnosis not present

## 2023-12-18 DIAGNOSIS — E782 Mixed hyperlipidemia: Secondary | ICD-10-CM | POA: Diagnosis not present

## 2023-12-18 DIAGNOSIS — I480 Paroxysmal atrial fibrillation: Secondary | ICD-10-CM | POA: Diagnosis not present

## 2023-12-18 DIAGNOSIS — G40909 Epilepsy, unspecified, not intractable, without status epilepticus: Secondary | ICD-10-CM | POA: Diagnosis not present

## 2023-12-18 DIAGNOSIS — E1165 Type 2 diabetes mellitus with hyperglycemia: Secondary | ICD-10-CM | POA: Diagnosis not present

## 2023-12-18 DIAGNOSIS — M24522 Contracture, left elbow: Secondary | ICD-10-CM | POA: Diagnosis not present

## 2023-12-18 DIAGNOSIS — D751 Secondary polycythemia: Secondary | ICD-10-CM | POA: Diagnosis not present

## 2023-12-18 DIAGNOSIS — Z0001 Encounter for general adult medical examination with abnormal findings: Secondary | ICD-10-CM | POA: Diagnosis not present

## 2023-12-18 DIAGNOSIS — N3281 Overactive bladder: Secondary | ICD-10-CM | POA: Diagnosis not present

## 2023-12-18 DIAGNOSIS — Z23 Encounter for immunization: Secondary | ICD-10-CM | POA: Diagnosis not present

## 2023-12-18 DIAGNOSIS — I10 Essential (primary) hypertension: Secondary | ICD-10-CM | POA: Diagnosis not present

## 2023-12-18 DIAGNOSIS — G8190 Hemiplegia, unspecified affecting unspecified side: Secondary | ICD-10-CM | POA: Diagnosis not present

## 2023-12-18 DIAGNOSIS — Z0000 Encounter for general adult medical examination without abnormal findings: Secondary | ICD-10-CM | POA: Diagnosis not present

## 2023-12-29 DIAGNOSIS — I10 Essential (primary) hypertension: Secondary | ICD-10-CM | POA: Diagnosis not present

## 2023-12-29 DIAGNOSIS — M25562 Pain in left knee: Secondary | ICD-10-CM | POA: Diagnosis not present

## 2023-12-29 DIAGNOSIS — I69354 Hemiplegia and hemiparesis following cerebral infarction affecting left non-dominant side: Secondary | ICD-10-CM | POA: Diagnosis not present

## 2023-12-29 DIAGNOSIS — G40909 Epilepsy, unspecified, not intractable, without status epilepticus: Secondary | ICD-10-CM | POA: Diagnosis not present

## 2023-12-29 DIAGNOSIS — M8580 Other specified disorders of bone density and structure, unspecified site: Secondary | ICD-10-CM | POA: Diagnosis not present

## 2023-12-29 DIAGNOSIS — E870 Hyperosmolality and hypernatremia: Secondary | ICD-10-CM | POA: Diagnosis not present

## 2023-12-29 DIAGNOSIS — K5909 Other constipation: Secondary | ICD-10-CM | POA: Diagnosis not present

## 2023-12-29 DIAGNOSIS — I480 Paroxysmal atrial fibrillation: Secondary | ICD-10-CM | POA: Diagnosis not present

## 2023-12-29 DIAGNOSIS — E1165 Type 2 diabetes mellitus with hyperglycemia: Secondary | ICD-10-CM | POA: Diagnosis not present

## 2023-12-29 DIAGNOSIS — N3281 Overactive bladder: Secondary | ICD-10-CM | POA: Diagnosis not present

## 2023-12-29 DIAGNOSIS — K219 Gastro-esophageal reflux disease without esophagitis: Secondary | ICD-10-CM | POA: Diagnosis not present

## 2023-12-29 DIAGNOSIS — E782 Mixed hyperlipidemia: Secondary | ICD-10-CM | POA: Diagnosis not present

## 2024-01-05 ENCOUNTER — Other Ambulatory Visit (HOSPITAL_COMMUNITY): Payer: Self-pay | Admitting: Internal Medicine

## 2024-01-05 DIAGNOSIS — Z1231 Encounter for screening mammogram for malignant neoplasm of breast: Secondary | ICD-10-CM

## 2024-01-15 DIAGNOSIS — I10 Essential (primary) hypertension: Secondary | ICD-10-CM | POA: Diagnosis not present

## 2024-01-15 DIAGNOSIS — E782 Mixed hyperlipidemia: Secondary | ICD-10-CM | POA: Diagnosis not present

## 2024-01-15 DIAGNOSIS — I480 Paroxysmal atrial fibrillation: Secondary | ICD-10-CM | POA: Diagnosis not present

## 2024-01-15 DIAGNOSIS — E1165 Type 2 diabetes mellitus with hyperglycemia: Secondary | ICD-10-CM | POA: Diagnosis not present

## 2024-01-27 DIAGNOSIS — N3281 Overactive bladder: Secondary | ICD-10-CM | POA: Diagnosis not present

## 2024-01-27 DIAGNOSIS — E782 Mixed hyperlipidemia: Secondary | ICD-10-CM | POA: Diagnosis not present

## 2024-01-27 DIAGNOSIS — K219 Gastro-esophageal reflux disease without esophagitis: Secondary | ICD-10-CM | POA: Diagnosis not present

## 2024-01-27 DIAGNOSIS — E870 Hyperosmolality and hypernatremia: Secondary | ICD-10-CM | POA: Diagnosis not present

## 2024-01-27 DIAGNOSIS — G40909 Epilepsy, unspecified, not intractable, without status epilepticus: Secondary | ICD-10-CM | POA: Diagnosis not present

## 2024-01-27 DIAGNOSIS — M25562 Pain in left knee: Secondary | ICD-10-CM | POA: Diagnosis not present

## 2024-01-27 DIAGNOSIS — I10 Essential (primary) hypertension: Secondary | ICD-10-CM | POA: Diagnosis not present

## 2024-01-27 DIAGNOSIS — K5909 Other constipation: Secondary | ICD-10-CM | POA: Diagnosis not present

## 2024-01-27 DIAGNOSIS — I69354 Hemiplegia and hemiparesis following cerebral infarction affecting left non-dominant side: Secondary | ICD-10-CM | POA: Diagnosis not present

## 2024-01-27 DIAGNOSIS — I480 Paroxysmal atrial fibrillation: Secondary | ICD-10-CM | POA: Diagnosis not present

## 2024-01-27 DIAGNOSIS — M8580 Other specified disorders of bone density and structure, unspecified site: Secondary | ICD-10-CM | POA: Diagnosis not present

## 2024-01-27 DIAGNOSIS — E1165 Type 2 diabetes mellitus with hyperglycemia: Secondary | ICD-10-CM | POA: Diagnosis not present

## 2024-02-08 ENCOUNTER — Ambulatory Visit (HOSPITAL_COMMUNITY)
Admission: RE | Admit: 2024-02-08 | Discharge: 2024-02-08 | Disposition: A | Discharge status: Home / Self Care | Source: Ambulatory Visit | Attending: Internal Medicine | Admitting: Internal Medicine

## 2024-02-08 DIAGNOSIS — Z1231 Encounter for screening mammogram for malignant neoplasm of breast: Secondary | ICD-10-CM | POA: Diagnosis not present

## 2024-02-14 DIAGNOSIS — E1165 Type 2 diabetes mellitus with hyperglycemia: Secondary | ICD-10-CM | POA: Diagnosis not present

## 2024-02-14 DIAGNOSIS — I10 Essential (primary) hypertension: Secondary | ICD-10-CM | POA: Diagnosis not present

## 2024-02-14 DIAGNOSIS — E782 Mixed hyperlipidemia: Secondary | ICD-10-CM | POA: Diagnosis not present

## 2024-02-14 DIAGNOSIS — I480 Paroxysmal atrial fibrillation: Secondary | ICD-10-CM | POA: Diagnosis not present

## 2024-02-24 DIAGNOSIS — G40909 Epilepsy, unspecified, not intractable, without status epilepticus: Secondary | ICD-10-CM | POA: Diagnosis not present

## 2024-02-24 DIAGNOSIS — M8580 Other specified disorders of bone density and structure, unspecified site: Secondary | ICD-10-CM | POA: Diagnosis not present

## 2024-02-24 DIAGNOSIS — E1165 Type 2 diabetes mellitus with hyperglycemia: Secondary | ICD-10-CM | POA: Diagnosis not present

## 2024-02-24 DIAGNOSIS — I69354 Hemiplegia and hemiparesis following cerebral infarction affecting left non-dominant side: Secondary | ICD-10-CM | POA: Diagnosis not present

## 2024-02-24 DIAGNOSIS — I480 Paroxysmal atrial fibrillation: Secondary | ICD-10-CM | POA: Diagnosis not present

## 2024-02-24 DIAGNOSIS — M25562 Pain in left knee: Secondary | ICD-10-CM | POA: Diagnosis not present

## 2024-02-24 DIAGNOSIS — K219 Gastro-esophageal reflux disease without esophagitis: Secondary | ICD-10-CM | POA: Diagnosis not present

## 2024-02-24 DIAGNOSIS — E782 Mixed hyperlipidemia: Secondary | ICD-10-CM | POA: Diagnosis not present

## 2024-02-24 DIAGNOSIS — I10 Essential (primary) hypertension: Secondary | ICD-10-CM | POA: Diagnosis not present

## 2024-02-24 DIAGNOSIS — N3281 Overactive bladder: Secondary | ICD-10-CM | POA: Diagnosis not present

## 2024-02-24 DIAGNOSIS — K5909 Other constipation: Secondary | ICD-10-CM | POA: Diagnosis not present

## 2024-02-24 DIAGNOSIS — E870 Hyperosmolality and hypernatremia: Secondary | ICD-10-CM | POA: Diagnosis not present

## 2024-03-08 ENCOUNTER — Ambulatory Visit: Payer: Medicare Other

## 2024-03-08 DIAGNOSIS — I495 Sick sinus syndrome: Secondary | ICD-10-CM | POA: Diagnosis not present

## 2024-03-08 LAB — CUP PACEART REMOTE DEVICE CHECK
Battery Remaining Longevity: 81 mo
Battery Remaining Percentage: 68 %
Battery Voltage: 2.99 V
Brady Statistic AP VP Percent: 0 %
Brady Statistic AP VS Percent: 0 %
Brady Statistic AS VP Percent: 0 %
Brady Statistic AS VS Percent: 0 %
Brady Statistic RA Percent Paced: 0 %
Brady Statistic RV Percent Paced: 7.8 %
Date Time Interrogation Session: 20251223020014
Implantable Lead Connection Status: 753985
Implantable Lead Connection Status: 753985
Implantable Lead Implant Date: 20220608
Implantable Lead Implant Date: 20220608
Implantable Lead Location: 753859
Implantable Lead Location: 753860
Implantable Pulse Generator Implant Date: 20220608
Lead Channel Impedance Value: 490 Ohm
Lead Channel Impedance Value: 590 Ohm
Lead Channel Pacing Threshold Amplitude: 0.5 V
Lead Channel Pacing Threshold Amplitude: 0.75 V
Lead Channel Pacing Threshold Pulse Width: 0.5 ms
Lead Channel Pacing Threshold Pulse Width: 0.5 ms
Lead Channel Sensing Intrinsic Amplitude: 4.5 mV
Lead Channel Sensing Intrinsic Amplitude: 9.9 mV
Lead Channel Setting Pacing Amplitude: 2 V
Lead Channel Setting Pacing Amplitude: 2.5 V
Lead Channel Setting Pacing Pulse Width: 0.5 ms
Lead Channel Setting Sensing Sensitivity: 2 mV
Pulse Gen Model: 2272
Pulse Gen Serial Number: 6462574

## 2024-03-09 NOTE — Progress Notes (Signed)
 Remote PPM Transmission

## 2024-03-11 ENCOUNTER — Ambulatory Visit: Payer: Self-pay | Admitting: Student in an Organized Health Care Education/Training Program

## 2024-03-11 DIAGNOSIS — I4821 Permanent atrial fibrillation: Secondary | ICD-10-CM

## 2024-03-11 MED ORDER — APIXABAN 5 MG PO TABS: 5.0000 mg | ORAL_TABLET | Freq: Two times a day (BID) | ORAL | 3 refills | Status: AC

## 2024-05-27 ENCOUNTER — Ambulatory Visit: Admitting: Student in an Organized Health Care Education/Training Program
